# Patient Record
Sex: Male | Born: 1945
Health system: Southern US, Community
[De-identification: ages and names within clinical notes are randomized; demographics above are authoritative.]

## PROBLEM LIST (undated history)

## (undated) DIAGNOSIS — I4729 Other ventricular tachycardia: Secondary | ICD-10-CM

## (undated) DIAGNOSIS — M48061 Spinal stenosis, lumbar region without neurogenic claudication: Secondary | ICD-10-CM

## (undated) DIAGNOSIS — C679 Malignant neoplasm of bladder, unspecified: Secondary | ICD-10-CM

## (undated) DIAGNOSIS — I739 Peripheral vascular disease, unspecified: Secondary | ICD-10-CM

## (undated) DIAGNOSIS — E119 Type 2 diabetes mellitus without complications: Secondary | ICD-10-CM

## (undated) DIAGNOSIS — I493 Ventricular premature depolarization: Secondary | ICD-10-CM

## (undated) DIAGNOSIS — I25110 Atherosclerotic heart disease of native coronary artery with unstable angina pectoris: Secondary | ICD-10-CM

## (undated) DIAGNOSIS — I2510 Atherosclerotic heart disease of native coronary artery without angina pectoris: Secondary | ICD-10-CM

## (undated) DIAGNOSIS — I219 Acute myocardial infarction, unspecified: Secondary | ICD-10-CM

## (undated) DIAGNOSIS — Z955 Presence of coronary angioplasty implant and graft: Secondary | ICD-10-CM

## (undated) DIAGNOSIS — E785 Hyperlipidemia, unspecified: Secondary | ICD-10-CM

## (undated) DIAGNOSIS — I480 Paroxysmal atrial fibrillation: Secondary | ICD-10-CM

## (undated) DIAGNOSIS — M1990 Unspecified osteoarthritis, unspecified site: Secondary | ICD-10-CM

## (undated) DIAGNOSIS — I5022 Chronic systolic (congestive) heart failure: Secondary | ICD-10-CM

## (undated) DIAGNOSIS — I255 Ischemic cardiomyopathy: Secondary | ICD-10-CM

## (undated) DIAGNOSIS — F17210 Nicotine dependence, cigarettes, uncomplicated: Secondary | ICD-10-CM

## (undated) DIAGNOSIS — I472 Ventricular tachycardia: Secondary | ICD-10-CM

## (undated) DIAGNOSIS — I10 Essential (primary) hypertension: Secondary | ICD-10-CM

## (undated) HISTORY — DX: Ventricular premature depolarization: I49.3

## (undated) HISTORY — DX: Paroxysmal atrial fibrillation: I48.0

## (undated) HISTORY — DX: Presence of coronary angioplasty implant and graft: Z95.5

## (undated) HISTORY — DX: Atherosclerotic heart disease of native coronary artery without angina pectoris: I25.10

## (undated) HISTORY — DX: Malignant neoplasm of bladder, unspecified: C67.9

## (undated) HISTORY — DX: Ventricular tachycardia: I47.2

## (undated) HISTORY — PX: APPENDECTOMY: SHX54

## (undated) HISTORY — DX: Spinal stenosis, lumbar region without neurogenic claudication: M48.061

## (undated) HISTORY — DX: Hyperlipidemia, unspecified: E78.5

## (undated) HISTORY — DX: Ischemic cardiomyopathy: I25.5

## (undated) HISTORY — DX: Acute myocardial infarction, unspecified: I21.9

## (undated) HISTORY — DX: Essential (primary) hypertension: I10

## (undated) HISTORY — DX: Peripheral vascular disease, unspecified: I73.9

## (undated) HISTORY — PX: BACK SURGERY: SHX140

## (undated) HISTORY — DX: Chronic systolic (congestive) heart failure: I50.22

## (undated) HISTORY — DX: Other ventricular tachycardia: I47.29

## (undated) HISTORY — DX: Nicotine dependence, cigarettes, uncomplicated: F17.210

## (undated) HISTORY — PX: SHOULDER OPEN ROTATOR CUFF REPAIR: SHX2407

## (undated) HISTORY — PX: CORONARY ANGIOPLASTY WITH STENT PLACEMENT: SHX49

## (undated) HISTORY — DX: Atherosclerotic heart disease of native coronary artery with unstable angina pectoris: I25.110

## (undated) SURGICAL SUPPLY — 1 items: RANGER DCB 6.0X80 135 (BALLOONS) ×1 IMPLANT

---

## 1990-04-18 HISTORY — PX: AORTA - BILATERAL FEMORAL ARTERY BYPASS GRAFT: SHX1175

## 1998-07-09 ENCOUNTER — Inpatient Hospital Stay (HOSPITAL_COMMUNITY): Admission: AD | Admit: 1998-07-09 | Discharge: 1998-07-12 | Payer: Self-pay | Admitting: Internal Medicine

## 2004-03-03 ENCOUNTER — Inpatient Hospital Stay (HOSPITAL_COMMUNITY): Admission: EM | Admit: 2004-03-03 | Discharge: 2004-03-05 | Payer: Self-pay | Admitting: Emergency Medicine

## 2004-03-03 ENCOUNTER — Ambulatory Visit: Payer: Self-pay | Admitting: Cardiology

## 2004-03-04 ENCOUNTER — Encounter: Payer: Self-pay | Admitting: Cardiology

## 2004-03-19 ENCOUNTER — Ambulatory Visit: Payer: Self-pay | Admitting: Cardiology

## 2004-04-01 ENCOUNTER — Ambulatory Visit: Payer: Self-pay

## 2004-04-01 ENCOUNTER — Ambulatory Visit: Payer: Self-pay | Admitting: Cardiology

## 2004-12-07 ENCOUNTER — Ambulatory Visit: Payer: Self-pay | Admitting: *Deleted

## 2004-12-07 ENCOUNTER — Inpatient Hospital Stay (HOSPITAL_COMMUNITY): Admission: AD | Admit: 2004-12-07 | Discharge: 2004-12-10 | Payer: Self-pay | Admitting: Cardiology

## 2004-12-28 ENCOUNTER — Ambulatory Visit: Payer: Self-pay | Admitting: *Deleted

## 2005-01-06 ENCOUNTER — Inpatient Hospital Stay (HOSPITAL_COMMUNITY): Admission: EM | Admit: 2005-01-06 | Discharge: 2005-01-09 | Payer: Self-pay | Admitting: Emergency Medicine

## 2005-01-18 ENCOUNTER — Ambulatory Visit: Payer: Self-pay | Admitting: Internal Medicine

## 2005-03-09 ENCOUNTER — Ambulatory Visit: Payer: Self-pay

## 2005-03-09 ENCOUNTER — Encounter: Payer: Self-pay | Admitting: Cardiology

## 2005-03-09 ENCOUNTER — Ambulatory Visit: Payer: Self-pay | Admitting: Cardiology

## 2005-05-11 ENCOUNTER — Ambulatory Visit: Payer: Self-pay | Admitting: Cardiology

## 2006-02-10 ENCOUNTER — Ambulatory Visit: Payer: Self-pay | Admitting: Cardiology

## 2006-11-07 ENCOUNTER — Ambulatory Visit: Payer: Self-pay | Admitting: Cardiology

## 2006-11-07 LAB — CONVERTED CEMR LAB
ALT: 35 units/L (ref 0–53)
AST: 29 units/L (ref 0–37)
Albumin: 3.7 g/dL (ref 3.5–5.2)
Alkaline Phosphatase: 52 units/L (ref 39–117)
BUN: 14 mg/dL (ref 6–23)
Basophils Absolute: 0 10*3/uL (ref 0.0–0.1)
Basophils Relative: 0.7 % (ref 0.0–1.0)
Bilirubin, Direct: 0.1 mg/dL (ref 0.0–0.3)
CO2: 25 meq/L (ref 19–32)
Calcium: 8.8 mg/dL (ref 8.4–10.5)
Chloride: 104 meq/L (ref 96–112)
Cholesterol: 149 mg/dL (ref 0–200)
Creatinine, Ser: 1 mg/dL (ref 0.4–1.5)
Eosinophils Absolute: 0.1 10*3/uL (ref 0.0–0.6)
Eosinophils Relative: 1.2 % (ref 0.0–5.0)
GFR calc Af Amer: 98 mL/min
GFR calc non Af Amer: 81 mL/min
Glucose, Bld: 188 mg/dL — ABNORMAL HIGH (ref 70–99)
HCT: 43.6 % (ref 39.0–52.0)
HDL: 24.4 mg/dL — ABNORMAL LOW (ref >39.0)
Hemoglobin: 15.2 g/dL (ref 13.0–17.0)
LDL Cholesterol: 104 mg/dL — ABNORMAL HIGH (ref 0–99)
Lymphocytes Relative: 28.9 % (ref 12.0–46.0)
MCHC: 34.9 g/dL (ref 30.0–36.0)
MCV: 91.1 fL (ref 78.0–100.0)
Monocytes Absolute: 0.4 10*3/uL (ref 0.2–0.7)
Monocytes Relative: 5.9 % (ref 3.0–11.0)
Neutro Abs: 4.4 10*3/uL (ref 1.4–7.7)
Neutrophils Relative %: 63.3 % (ref 43.0–77.0)
Platelets: 226 10*3/uL (ref 150–400)
Potassium: 4 meq/L (ref 3.5–5.1)
RBC: 4.79 M/uL (ref 4.22–5.81)
RDW: 12.3 % (ref 11.5–14.6)
Sodium: 136 meq/L (ref 135–145)
TSH: 1.44 microintl units/mL (ref 0.35–5.50)
Total Bilirubin: 0.5 mg/dL (ref 0.3–1.2)
Total CHOL/HDL Ratio: 6.1
Total Protein: 6.4 g/dL (ref 6.0–8.3)
Triglycerides: 101 mg/dL (ref 0–149)
VLDL: 20 mg/dL (ref 0–40)
WBC: 6.9 10*3/uL (ref 4.5–10.5)

## 2006-11-20 ENCOUNTER — Ambulatory Visit: Payer: Self-pay

## 2007-10-26 ENCOUNTER — Ambulatory Visit: Payer: Self-pay | Admitting: Cardiology

## 2008-01-04 ENCOUNTER — Ambulatory Visit: Payer: Self-pay

## 2008-01-04 ENCOUNTER — Encounter: Payer: Self-pay | Admitting: Cardiology

## 2008-09-08 ENCOUNTER — Telehealth (INDEPENDENT_AMBULATORY_CARE_PROVIDER_SITE_OTHER): Payer: Self-pay | Admitting: *Deleted

## 2008-09-22 ENCOUNTER — Encounter: Payer: Self-pay | Admitting: Cardiology

## 2008-09-22 DIAGNOSIS — I2589 Other forms of chronic ischemic heart disease: Secondary | ICD-10-CM | POA: Insufficient documentation

## 2008-09-22 DIAGNOSIS — E785 Hyperlipidemia, unspecified: Secondary | ICD-10-CM | POA: Insufficient documentation

## 2008-09-22 DIAGNOSIS — I10 Essential (primary) hypertension: Secondary | ICD-10-CM | POA: Insufficient documentation

## 2008-09-22 DIAGNOSIS — I2510 Atherosclerotic heart disease of native coronary artery without angina pectoris: Secondary | ICD-10-CM | POA: Insufficient documentation

## 2008-09-22 DIAGNOSIS — I5022 Chronic systolic (congestive) heart failure: Secondary | ICD-10-CM | POA: Insufficient documentation

## 2008-09-22 DIAGNOSIS — I739 Peripheral vascular disease, unspecified: Secondary | ICD-10-CM | POA: Insufficient documentation

## 2008-09-22 HISTORY — DX: Peripheral vascular disease, unspecified: I73.9

## 2008-09-22 HISTORY — DX: Atherosclerotic heart disease of native coronary artery without angina pectoris: I25.10

## 2008-09-22 HISTORY — DX: Essential (primary) hypertension: I10

## 2008-09-22 HISTORY — DX: Other forms of chronic ischemic heart disease: I25.89

## 2008-09-22 HISTORY — DX: Hyperlipidemia, unspecified: E78.5

## 2008-09-22 HISTORY — DX: Chronic systolic (congestive) heart failure: I50.22

## 2008-09-23 ENCOUNTER — Ambulatory Visit: Payer: Self-pay | Admitting: Cardiology

## 2008-10-07 ENCOUNTER — Telehealth (INDEPENDENT_AMBULATORY_CARE_PROVIDER_SITE_OTHER): Payer: Self-pay | Admitting: *Deleted

## 2008-10-08 ENCOUNTER — Encounter: Payer: Self-pay | Admitting: Cardiology

## 2008-10-08 ENCOUNTER — Ambulatory Visit: Payer: Self-pay

## 2008-10-13 ENCOUNTER — Telehealth (INDEPENDENT_AMBULATORY_CARE_PROVIDER_SITE_OTHER): Payer: Self-pay | Admitting: *Deleted

## 2008-10-22 ENCOUNTER — Ambulatory Visit: Payer: Self-pay

## 2008-10-22 ENCOUNTER — Telehealth (INDEPENDENT_AMBULATORY_CARE_PROVIDER_SITE_OTHER): Payer: Self-pay | Admitting: *Deleted

## 2008-10-22 ENCOUNTER — Telehealth: Payer: Self-pay | Admitting: Cardiology

## 2008-10-22 ENCOUNTER — Encounter: Payer: Self-pay | Admitting: Cardiology

## 2008-11-03 ENCOUNTER — Telehealth: Payer: Self-pay | Admitting: Cardiology

## 2008-12-16 ENCOUNTER — Ambulatory Visit: Payer: Self-pay | Admitting: Cardiology

## 2008-12-31 ENCOUNTER — Ambulatory Visit (HOSPITAL_COMMUNITY): Admission: RE | Admit: 2008-12-31 | Discharge: 2009-01-01 | Payer: Self-pay | Admitting: Neurological Surgery

## 2008-12-31 HISTORY — PX: LUMBAR MICRODISCECTOMY: SHX99

## 2009-01-07 ENCOUNTER — Ambulatory Visit: Payer: Self-pay | Admitting: Internal Medicine

## 2009-05-12 ENCOUNTER — Encounter: Payer: Self-pay | Admitting: Cardiology

## 2009-05-18 ENCOUNTER — Telehealth: Payer: Self-pay | Admitting: Cardiology

## 2009-05-25 ENCOUNTER — Ambulatory Visit: Payer: Self-pay | Admitting: Cardiology

## 2009-05-27 ENCOUNTER — Encounter: Payer: Self-pay | Admitting: Cardiology

## 2009-05-27 ENCOUNTER — Ambulatory Visit: Payer: Self-pay

## 2009-05-29 ENCOUNTER — Ambulatory Visit: Payer: Self-pay | Admitting: Cardiovascular Disease

## 2009-05-29 DIAGNOSIS — F17200 Nicotine dependence, unspecified, uncomplicated: Secondary | ICD-10-CM

## 2009-05-29 HISTORY — DX: Nicotine dependence, unspecified, uncomplicated: F17.200

## 2009-06-08 ENCOUNTER — Ambulatory Visit: Payer: Self-pay | Admitting: Cardiovascular Disease

## 2009-06-08 LAB — CONVERTED CEMR LAB
BUN: 17 mg/dL (ref 6–23)
Basophils Absolute: 0 10*3/uL (ref 0.0–0.1)
Basophils Relative: 0.3 % (ref 0.0–3.0)
CO2: 30 meq/L (ref 19–32)
Calcium: 9.4 mg/dL (ref 8.4–10.5)
Chloride: 112 meq/L (ref 96–112)
Creatinine, Ser: 1.1 mg/dL (ref 0.4–1.5)
Eosinophils Absolute: 0.1 10*3/uL (ref 0.0–0.7)
Eosinophils Relative: 0.8 % (ref 0.0–5.0)
GFR calc non Af Amer: 71.54 mL/min (ref >60)
Glucose, Bld: 148 mg/dL — ABNORMAL HIGH (ref 70–99)
HCT: 42.9 % (ref 39.0–52.0)
Hemoglobin: 14.2 g/dL (ref 13.0–17.0)
INR: 1 (ref 0.8–1.0)
Lymphocytes Relative: 37.3 % (ref 12.0–46.0)
Lymphs Abs: 2.4 10*3/uL (ref 0.7–4.0)
MCHC: 33 g/dL (ref 30.0–36.0)
MCV: 96.4 fL (ref 78.0–100.0)
Monocytes Absolute: 0.5 10*3/uL (ref 0.1–1.0)
Monocytes Relative: 7.6 % (ref 3.0–12.0)
Neutro Abs: 3.5 10*3/uL (ref 1.4–7.7)
Neutrophils Relative %: 54 % (ref 43.0–77.0)
Platelets: 208 10*3/uL (ref 150.0–400.0)
Potassium: 4.7 meq/L (ref 3.5–5.1)
Prothrombin Time: 9.9 s (ref 9.1–11.7)
RBC: 4.45 M/uL (ref 4.22–5.81)
RDW: 13 % (ref 11.5–14.6)
Sodium: 144 meq/L (ref 135–145)
WBC: 6.5 10*3/uL (ref 4.5–10.5)

## 2009-06-11 ENCOUNTER — Ambulatory Visit: Payer: Self-pay | Admitting: Cardiovascular Disease

## 2009-06-11 ENCOUNTER — Ambulatory Visit (HOSPITAL_COMMUNITY): Admission: RE | Admit: 2009-06-11 | Discharge: 2009-06-11 | Payer: Self-pay | Admitting: Cardiovascular Disease

## 2009-06-15 ENCOUNTER — Ambulatory Visit: Payer: Self-pay | Admitting: Surgery

## 2009-06-19 ENCOUNTER — Ambulatory Visit: Payer: Self-pay | Admitting: Vascular Surgery

## 2009-06-24 ENCOUNTER — Ambulatory Visit (HOSPITAL_COMMUNITY): Admission: RE | Admit: 2009-06-24 | Discharge: 2009-06-24 | Payer: Self-pay | Admitting: Vascular Surgery

## 2009-07-14 ENCOUNTER — Encounter: Admission: RE | Admit: 2009-07-14 | Discharge: 2009-07-14 | Payer: Self-pay | Admitting: Neurological Surgery

## 2009-07-16 ENCOUNTER — Encounter (INDEPENDENT_AMBULATORY_CARE_PROVIDER_SITE_OTHER): Payer: Self-pay | Admitting: *Deleted

## 2009-07-17 HISTORY — PX: FEMORAL-FEMORAL BYPASS GRAFT: SHX936

## 2009-07-20 ENCOUNTER — Ambulatory Visit: Payer: Self-pay | Admitting: Vascular Surgery

## 2009-07-20 ENCOUNTER — Inpatient Hospital Stay (HOSPITAL_COMMUNITY): Admission: RE | Admit: 2009-07-20 | Discharge: 2009-07-22 | Payer: Self-pay | Admitting: Vascular Surgery

## 2009-07-21 ENCOUNTER — Encounter: Payer: Self-pay | Admitting: Vascular Surgery

## 2009-08-14 ENCOUNTER — Ambulatory Visit: Payer: Self-pay | Admitting: Vascular Surgery

## 2009-10-15 ENCOUNTER — Telehealth: Payer: Self-pay | Admitting: Cardiovascular Disease

## 2010-04-28 ENCOUNTER — Ambulatory Visit: Admit: 2010-04-28 | Payer: Self-pay | Admitting: Vascular Surgery

## 2010-05-05 ENCOUNTER — Encounter: Payer: Self-pay | Admitting: Cardiovascular Disease

## 2010-05-05 ENCOUNTER — Ambulatory Visit
Admission: RE | Admit: 2010-05-05 | Discharge: 2010-05-05 | Payer: Self-pay | Source: Home / Self Care | Attending: Cardiovascular Disease | Admitting: Cardiovascular Disease

## 2010-05-18 ENCOUNTER — Ambulatory Visit
Admission: RE | Admit: 2010-05-18 | Discharge: 2010-05-18 | Payer: Self-pay | Source: Home / Self Care | Attending: Vascular Surgery | Admitting: Vascular Surgery

## 2010-05-18 ENCOUNTER — Encounter: Payer: Self-pay | Admitting: Cardiovascular Disease

## 2010-05-19 NOTE — Assessment & Plan Note (Addendum)
OFFICE VISIT  [name], [name] DOB:  Nov 07, 1945                                       05/18/2010 ZOXWR#:[id number]  HISTORY OF PRESENT ILLNESS:  The patient presents today for followup of his arterial insufficiency.  He is well-known to me from prior aortobifemoral bypass grafting for occlusive disease nearly 20 years ago.  He had subsequently had thrombosis of his right limb of his graft and in April 2011 underwent left to right fem-fem bypass.  He has done quite well since that time.  He has no claudication symptoms.  He does have multiple other difficulties to include degenerative disk disease and he has seen Dr. Marikay Alar for potential lumbar surgery.  He has, also, peripheral neuropathy related to diabetes.  He does have a history of coronary disease with prior stenting in his coronaries.  He reports that he is stable from a cardiac standpoint.  PAST MEDICAL HISTORY:  Significant for hypertension, elevated blood pressure, elevated cholesterol, prior heart attack.  He does have a history of bladder cancer.  SOCIAL HISTORY:  He is married with 2 children.  He is retired.  He does continue to smoke 1 pack of cigarettes per day.  He does not drink alcohol on a regular  basis.  FAMILY HISTORY:  Significant for myocardial infarction in his father and sister.  REVIEW OF SYSTEMS:  He does have shortness of breath with exertion which is his most limiting symptom.  Otherwise, nothing positive on the review except as in the history of present illness.  PHYSICAL EXAM:  General:  Well-developed, well-nourished, white male appearing stated age in no acute stress.  Vital signs:  Blood pressure 164/68, pulse 90, respirations 20.  He is in no acute distress.  HEENT: Normal.  Chest:  Clear bilaterally.  Heart:  Regular rate rhythm. Abdomen:  Soft, nontender.  He has a well-healed midline incision without evidence of hernia.  He does have 2+ femoral, 2+ popliteal,  and 2+ posterior tibial pulses bilaterally.  Neurologic:  No focal weakness or paresthesias.  SKIN:  Without ulcers or rashes.  He does report some tingling and numbness sensation in the medial aspect of his left thigh.  ASSESSMENT AND PLAN:  I do not feel this is evidence of ischemia, but feel that this may be related to the fact that he has had 2 prior incisions in this area.  I explained that this typically is expected to resolve pretty quickly after surgery and if he continues to have some discomfort this may be persistent.  He understands this and is not limited by this.  He will continue his walking program and will notify should he develop any claudication symptoms.  Otherwise, he will follow up with Dr. Clifton James from a cardiac standpoint.    Larina Earthly, M.D. Electronically Signed  TFE/MEDQ  D:  05/18/2010  T:  05/19/2010  Job:  [id number]  cc:   Verne Carrow, MD Sherri Sear, MD

## 2010-05-20 NOTE — Assessment & Plan Note (Signed)
Summary: rov per spouse call/lg  Medications Added VITAMIN D 1000 UNIT TABS (CHOLECALCIFEROL) take 1 tablet once daily      Allergies Added: NKDA  Primary Provider:  Dr. Maisie Fus [facility])  CC:  no cardiac complaint.  History of Present Illness: 65 yo WM with history of CAD, ischemic cardiomyopathy, HTN, hyperlipidemia, ongoing tobacco abuse and known PVD with prior aortobifemoral bypass by Dr. Arbie Cookey in 1992 who is here today for PV follow up.  I saw him first in February 2011. He was having right hip pain with radiation down to his right knee. No calf pain. The pain improved with rest. He did hurt his back in 2009 and the right leg pain started at that time. He has since had a back surgery in 9/10. This did not help his right leg pain. He had a nerve conduction study in the right leg and was told that it was abnormal.  He had non-invasive dopplers done in our office in February 2011 with ABI  0.45 on the right and 0.56 on the left. The right limb of the aortofemoral bypass graft appeared  to be occluded. The left limb of the bypass was patent. There was dampened flow in the right CFA. Monophasic flow in the SFA and popliteal artery. Left SFA and popliteal with brisk flow. Right ATA and PTA waveforms are monophasic. Left ATA and PTA waveforms are biphasic. Probable tibial disease on left. I arranged a distal aortogram and was found to have severe disease as documented below. He was seen by Dr. Arbie Cookey and underwent fem to fem bypass on 07/21/09. He has continued to have some left leg pain but his right leg is much better. He has chronic back issues and had a recent MRI. He had a recent doppler of his lower extremities at [facility] but I do not have this report. This was ordered by Dr. Maisie Fus in [place]. He missed f/u with Dr. Arbie Cookey as planned last month. He continues to smoke. His cardiac issues were followed in the past by Dr. Juanda Chance . He has no chest pain or SOB, near syncope or syncope.     Current Medications (verified): 1)  Metoprolol Tartrate 50 Mg Tabs (Metoprolol Tartrate) .[name].. 1 By Mouth Two Times A Day 2)  Cozaar 50 Mg Tabs (Losartan Potassium) .[name].. 1 By Mouth Once Daily 3)  Zetia 10 Mg Tabs (Ezetimibe) .... Take One Tablet By Mouth Daily. 4)  Pravachol 20 Mg Tabs (Pravastatin Sodium) .[name].. 1 By Mouth Once Daily 5)  Aspirin 81 Mg Tbec (Aspirin) .... Take One Tablet By Mouth Daily 6)  Janumet 50-1000 Mg Tabs (Sitagliptin-Metformin Hcl) .[name].. 1 By Mouth Once Two Times A Day 7)  Actos 30 Mg Tabs (Pioglitazone Hcl) .[name].. 1 By Mouth Daily 8)  Spiriva Handihaler 18 Mcg Caps (Tiotropium Bromide Monohydrate) .... As Directed 9)  Nasonex 50 Mcg/act Susp (Mometasone Furoate) .... As Directed 10)  Proventil Hfa 108 (90 Base) Mcg/act Aers (Albuterol Sulfate) .... As Directed 11)  Vitamin D 1000 Unit Tabs (Cholecalciferol) .... Take 1 Tablet Once Daily  Allergies (verified): No Known Drug Allergies  Past History:  Past Medical History: 1. Coronary artery disease, status post DMI Rx Taxus stent RCA 2006 with subsequent Stent LAD and      subsequent stent thrombosis RCA unable to be opened 2006-- negative mv 10/2008 2. Ischemic cardiomyopathy, ejection fraction of 30%-35% by echo 11/2008. 3. Hyperlipidemia. 4. Peripheral vascular disease, status post bilateral aortobifemoral bypass in 1992 with recent fem  to fem bypass April 2011 per Dr. Arbie Cookey 5. Hypertension. 6. Congestive heart failure, class II/III (systolic). 7. Continued cigarette use.  8.  LSS disease 9. Diabetes  Social History: Reviewed history from 05/29/2009 and no changes required. Single.  One daughter.  Continues to smoke 3/4ppd (had smoked 2-3 ppd). x 50 years. Occasional EtOH. The patient lives with his girlfriend in [place], [place].  He is a retired [employer] architect.  Review of Systems  The patient denies fatigue, malaise, fever, weight gain/loss, vision loss, decreased hearing, hoarseness, chest pain,  palpitations, shortness of breath, prolonged cough, wheezing, sleep apnea, coughing up blood, abdominal pain, blood in stool, nausea, vomiting, diarrhea, heartburn, incontinence, blood in urine, muscle weakness, joint pain, leg swelling, rash, skin lesions, headache, fainting, dizziness, depression, anxiety, enlarged lymph nodes, easy bruising or bleeding, and environmental allergies.         Leg pain. See HPI.   Vital Signs:  Patient profile:   65 year old male Height:      67 inches Weight:      196.50 pounds BMI:     30.89 Pulse rate:   77 / minute BP sitting:   136 / 82  Vitals Entered By: Haze Boyden, CMA (May 05, 2010 12:53 PM)  Physical Exam  General:  General: Well developed, well nourished, NAD HEENT: OP clear, mucus membranes moist SKIN: warm, dry Neuro: No focal deficits Musculoskeletal: Muscle strength 5/5 all ext Psychiatric: Mood and affect normal Neck: No JVD, no carotid bruits, no thyromegaly, no lymphadenopathy. Lungs:Clear bilaterally, no wheezes, rhonci, crackles CV: RRR no murmurs, gallops rubs Abdomen: soft, NT, ND, BS present Extremities: No edema, pulses 1+ bilateral DP/PT    EKG  Procedure date:  05/05/2010  Findings:      Sinus rhythm, rate 77 bpm. Occasional PACs. Non-specific T wave flattening.   Impression & Recommendations:  Problem # 1:  CAD, NATIVE VESSEL (ICD-414.01) Stable. Continue current meds.   His updated medication list for this problem includes:    Metoprolol Tartrate 50 Mg Tabs (Metoprolol tartrate) .[name]... 1 by mouth two times a day    Aspirin 81 Mg Tbec (Aspirin) .[name]... Take one tablet by mouth daily  Problem # 2:  CARDIOMYOPATHY, ISCHEMIC (ICD-414.8) Stable. Volume status ok.  Continue current meds.   His updated medication list for this problem includes:    Metoprolol Tartrate 50 Mg Tabs (Metoprolol tartrate) .[name]... 1 by mouth two times a day    Cozaar 50 Mg Tabs (Losartan potassium) .[name]... 1 by mouth once daily     Aspirin 81 Mg Tbec (Aspirin) .[name]... Take one tablet by mouth daily  Problem # 3:  PVD (ICD-443.9) Recent fem to fem bypass by Dr. Arbie Cookey. He will follow up with Dr. Arbie Cookey in the next several weeks. We will get the LE doppler reports from Dr.Thomas's office in [place] and fax over to Dr. Arbie Cookey. He needs to have close follow up with Dr. Arbie Cookey with non-invasive imaging on a schedule.   Smoking cessation advised.   Patient Instructions: 1)  Your physician recommends that you schedule a follow-up appointment in: 1 year 2)  Your physician recommends that you continue on your current medications as directed. Please refer to the Current Medication list given to you today. Prescriptions: PRAVACHOL 20 MG TABS (PRAVASTATIN SODIUM) 1 by mouth once daily  #90 x 5   Entered by:   Whitney Maeola Sarah RN   Authorized by:   Verne Carrow, MD   Signed by:  Whitney Maeola Sarah RN on 05/05/2010   Method used:   Print then Give to Patient   RxID:   [id number] ZETIA 10 MG TABS (EZETIMIBE) Take one tablet by mouth daily.  #90 x 3   Entered by:   Whitney Maeola Sarah RN   Authorized by:   Verne Carrow, MD   Signed by:   Whitney Maeola Sarah RN on 05/05/2010   Method used:   Print then Give to Patient   RxID:   [id number] COZAAR 50 MG TABS (LOSARTAN POTASSIUM) 1 by mouth once daily  #90 x 3   Entered by:   Whitney Maeola Sarah RN   Authorized by:   Verne Carrow, MD   Signed by:   Ellender Hose RN on 05/05/2010   Method used:   Print then Give to Patient   RxID:   [id number] METOPROLOL TARTRATE 50 MG TABS (METOPROLOL TARTRATE) 1 by mouth two times a day  #180 x 3   Entered by:   Whitney Maeola Sarah RN   Authorized by:   Verne Carrow, MD   Signed by:   Ellender Hose RN on 05/05/2010   Method used:   Print then Give to Patient   RxID:   [id number]

## 2010-05-20 NOTE — Letter (Signed)
Summary: Appointment - Missed  [facility], Main Office  [address]   [place], [place] [address]   Phone: [phone]  Fax: [phone]     July 16, 2009 MRN: [id number]   [name] [address] [place], [place]  [address]   Dear [name],  Our records indicate you missed your appointment on  06-29-09  with Dr.  Clifton James       It is very important that we reach you to reschedule this appointment. We look forward to participating in your health care needs. Please contact us at the number listed above at your earliest convenience to reschedule this appointment.     Sincerely,    Glass blower/designer

## 2010-05-20 NOTE — Assessment & Plan Note (Signed)
Summary: 4 month rov  Medications Added ACTOS 30 MG TABS (PIOGLITAZONE HCL) 1 by mouth daily CIPROFLOXACIN HCL 750 MG TABS (CIPROFLOXACIN HCL) 1 by mouth two times a day SPIRIVA HANDIHALER 18 MCG CAPS (TIOTROPIUM BROMIDE MONOHYDRATE) as directed NASONEX 50 MCG/ACT SUSP (MOMETASONE FUROATE) as directed PROVENTIL HFA 108 (90 BASE) MCG/ACT AERS (ALBUTEROL SULFATE) as directed      Allergies Added: NKDA  Primary Provider:  dr Maisie Fus [facility])   History of Present Illness: The patient is 65 years old and return for management of CAD. He had stenting of the LAD and right coronary in the past and later had stent thrombosis of the right coronary stent was unsuccessful PCI. His ejection fraction is in the range of 30-35%.  I saw him last for a preoperative evaluation prior to back surgery and he got to his back surgery okay.  I also referred him to Dr. all read for consideration for an ICD. He is still not sure he wants to do this. 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  He also has peripheral vascular disease and has had aortofemoral bypass graft surgery. he is now having pain in his right hip when he walks and asked if this could be related to his circulation. He has known nerve damage in his right leg from recent nerve conduction studies related to his lumbar spine disease.  His other problems include hypertension and hyperlipidemia.  Current Medications (verified): 1)  Metoprolol Tartrate 50 Mg Tabs (Metoprolol Tartrate) .[name].. 1 By Mouth Two Times A Day 2)  Cozaar 50 Mg Tabs (Losartan Potassium) .[name].. 1 By Mouth Once Daily 3)  Zetia 10 Mg Tabs (Ezetimibe) .... Take One Tablet By Mouth Daily. 4)  Pravachol 20 Mg Tabs (Pravastatin Sodium) .[name].. 1 By Mouth Once Daily 5)  Aspirin 81 Mg Tbec (Aspirin) .... Take One Tablet By Mouth Daily 6)  Fish Oil Maximum Strength 1200 Mg Caps (Omega-3 Fatty Acids) .... 1000mg  Once Daily 7)  Janumet 50-1000 Mg Tabs  (Sitagliptin-Metformin Hcl) .[name].. 1 By Mouth Once Two Times A Day 8)  Actos 30 Mg Tabs (Pioglitazone Hcl) .[name].. 1 By Mouth Daily 9)  Ciprofloxacin Hcl 750 Mg Tabs (Ciprofloxacin Hcl) .[name].. 1 By Mouth Two Times A Day 10)  Spiriva Handihaler 18 Mcg Caps (Tiotropium Bromide Monohydrate) .... As Directed 11)  Nasonex 50 Mcg/act Susp (Mometasone Furoate) .... As Directed 12)  Proventil Hfa 108 (90 Base) Mcg/act Aers (Albuterol Sulfate) .... As Directed  Allergies (verified): No Known Drug Allergies  Past History:  Past Medical History: Reviewed history from 01/07/2009 and no changes required. 1. Coronary artery disease, status post DMI Rx Taxus stent RCA 2006 with subsequent Stent LAD and      subsequent stent thrombosis RCA unable to be opened 2006-- negative mv 10/2008 2. Ischemic cardiomyopathy, ejection fraction of 30%-35% by echo 11/2008. 3. Hyperlipidemia. 4. Peripheral vascular disease, status post bilateral femoral- popliteal bypass graft surgery 1992. 5. Hypertension. 6. Congestive heart failure, class II/III (systolic). 7. Continued cigarette use.  8.  LSS disease 9. Diabetes 10.HL  Review of Systems       ROS is negative except as outlined in HPI.   Vital Signs:  Patient profile:   65 year old male Height:      67 inches Weight:      189 pounds BMI:     29.71 Pulse rate:   85 / minute Resp:     16 per minute BP  sitting:   130 / 76  (left arm)  Vitals Entered By: Marrion Coy, CNA (May 25, 2009 10:08 AM)  Physical Exam  Additional Exam:  Gen. Well-nourished, in no distress   Neck: No JVD, thyroid not enlarged, no carotid bruits Lungs: No tachypnea, clear without rales, rhonchi or wheezes Cardiovascular: Rhythm regular, PMI not displaced,  heart sounds  normal, no murmurs or gallops, no peripheral edema, the right femoral pulse and pedal pulses on the right foot were difficult to feel Abdomen: BS normal, abdomen soft and non-tender without masses or organomegaly,  no hepatosplenomegaly. MS: No deformities, no cyanosis or clubbing   Neuro:  No focal sns   Skin:  no lesions    Impression & Recommendations:  Problem # 1:  CAD, NATIVE VESSEL (ICD-414.01)  He has had previous stenting of the LAD and RCA and has had stent thrombosis of the RCA was unsuccessful PCI. He's had no recent chest pain in the palm appears stable. His updated medication list for this problem includes:    Metoprolol Tartrate 50 Mg Tabs (Metoprolol tartrate) .[name]... 1 by mouth two times a day    Aspirin 81 Mg Tbec (Aspirin) .[name]... Take one tablet by mouth daily  His updated medication list for this problem includes:    Metoprolol Tartrate 50 Mg Tabs (Metoprolol tartrate) .[name]... 1 by mouth two times a day    Aspirin 81 Mg Tbec (Aspirin) .[name]... Take one tablet by mouth daily  Orders: EKG w/ Interpretation (93000)  Problem # 2:  SYSTOLIC HEART FAILURE, CHRONIC (ICD-428.22) He has an ejection fraction of 30% and chronic systolic heart failure. He appears euvolemic today and is probably class II. He is still undecided about whether to have an ICD. His updated medication list for this problem includes:    Metoprolol Tartrate 50 Mg Tabs (Metoprolol tartrate) .[name]... 1 by mouth two times a day    Cozaar 50 Mg Tabs (Losartan potassium) .[name]... 1 by mouth once daily    Aspirin 81 Mg Tbec (Aspirin) .[name]... Take one tablet by mouth daily  Problem # 3:  PVD (ICD-443.9) He has had prior aorto bifemoral bypass graft surgery. He has what sounds like claudication of his right hip and he has decreased pulses in the right femoral area. I suspect his symptoms are claudication. He has an appointment to see Dr. early in March and would like to see him sooner if possible. We will try to arrange that.  Other Orders: Arterial Duplex Lower Extremity (Arterial Duplex Low) Misc. Referral (Misc. Ref)  Patient Instructions: 1)  Your physician wants you to follow-up in: 6 months.  You will receive a reminder  letter in the mail two months in advance. If you don't receive a letter, please call our office to schedule the follow-up appointment. 2)  You are scheduled for a vascular ultrasound on wed 05/27/09 @ 11:00am  3)  You are scheduled to see Dr. Earney Hamburg on Friday 05/29/09 @ 11:00am.  Prescriptions: PRAVACHOL 20 MG TABS (PRAVASTATIN SODIUM) 1 by mouth once daily  #90 x 3   Entered by:   Sherri Rad, RN, BSN   Authorized by:   Lenoria Farrier, MD, [facility]   Signed by:   Sherri Rad, RN, BSN on 05/25/2009   Method used:   Print then Give to Patient   RxID:   [id number] ZETIA 10 MG TABS (EZETIMIBE) Take one tablet by mouth daily.  #90 x 3   Entered by:   Sherri Rad,  RN, BSN   Authorized by:   Lenoria Farrier, MD, [facility]   Signed by:   Sherri Rad, RN, BSN on 05/25/2009   Method used:   Print then Give to Patient   RxID:   [id number] COZAAR 50 MG TABS (LOSARTAN POTASSIUM) 1 by mouth once daily  #90 x 3   Entered by:   Sherri Rad, RN, BSN   Authorized by:   Lenoria Farrier, MD, [facility]   Signed by:   Sherri Rad, RN, BSN on 05/25/2009   Method used:   Print then Give to Patient   RxID:   [id number] METOPROLOL TARTRATE 50 MG TABS (METOPROLOL TARTRATE) 1 by mouth two times a day  #180 x 3   Entered by:   Sherri Rad, RN, BSN   Authorized by:   Lenoria Farrier, MD, [facility]   Signed by:   Sherri Rad, RN, BSN on 05/25/2009   Method used:   Print then Give to Patient   RxID:   [id number] PRAVACHOL 20 MG TABS (PRAVASTATIN SODIUM) 1 by mouth once daily  #30 x 6   Entered by:   Sherri Rad, RN, BSN   Authorized by:   Lenoria Farrier, MD, [facility]   Signed by:   Sherri Rad, RN, BSN on 05/25/2009   Method used:   Print then Give to Patient   RxID:   [id number] ZETIA 10 MG TABS (EZETIMIBE) Take one tablet by mouth daily.  #30 x 6   Entered by:   Sherri Rad, RN, BSN   Authorized by:   Lenoria Farrier, MD, [facility]   Signed  by:   Sherri Rad, RN, BSN on 05/25/2009   Method used:   Print then Give to Patient   RxID:   [id number] COZAAR 50 MG TABS (LOSARTAN POTASSIUM) 1 by mouth once daily  #30 x 6   Entered by:   Sherri Rad, RN, BSN   Authorized by:   Lenoria Farrier, MD, [facility]   Signed by:   Sherri Rad, RN, BSN on 05/25/2009   Method used:   Print then Give to Patient   RxID:   [id number] METOPROLOL TARTRATE 50 MG TABS (METOPROLOL TARTRATE) 1 by mouth two times a day  #60 x 6   Entered by:   Sherri Rad, RN, BSN   Authorized by:   Lenoria Farrier, MD, [facility]   Signed by:   Sherri Rad, RN, BSN on 05/25/2009   Method used:   Print then Give to Patient   RxID:   [id number]

## 2010-05-20 NOTE — Progress Notes (Signed)
Summary: clarify cardiac medication   Phone Note From Other Clinic   Caller: debbie office [phone] Request: Talk with Nurse Details for Reason: pls clarify cardiac medication for pt .  Initial call taken by: Lorne Skeens,  May 18, 2009 10:21 AM  Follow-up for Phone Call        I called and spoke with Eunice Blase- per Eunice Blase, they have just refilled the pt's cozaar. Their office had on file that the pt was taking cozaar 25mg  once daily. The pt picked up this medication and realized it was the wrong dose. Debbie called here to clarify- I explained that the pt is taking Cozaar 50mg  once daily. Eunice Blase will contact [facility] about the incorrect dose. I will go ahead and send in a corrected RX to [facility] in [place] for Cozaar 50mg  once daily so his next refill will be directed to cardiology. Sherri Rad, RN, BSN  May 18, 2009 10:56 AM     Prescriptions: COZAAR 50 MG TABS (LOSARTAN POTASSIUM) 1 by mouth once daily  #30 x 6   Entered by:   Sherri Rad, RN, BSN   Authorized by:   Lenoria Farrier, MD, [facility]   Signed by:   Sherri Rad, RN, BSN on 05/18/2009   Method used:   Electronically to        [facility] (retail)       [address]       [place], [place]  [address]       Ph: [phone] or [phone]       Fax: [phone]   RxID:   [id number]

## 2010-05-20 NOTE — Letter (Signed)
Summary: Peripheral Vascular  [facility], Main Office  [address]   [place], [place] [address]   Phone: [phone]  Fax: [phone]     05/29/2009 MRN: [id number]  [name] [address] [place], [place]  [address]  Dear [name],   You are scheduled for Peripheral Vascular Angiogram on    June 11, 2009           with Dr.   Clifton James         .  Please arrive at the [facility] at   8:30    a.m. on the day of your procedure.  1. DIET     ___x_ Nothing to eat or drink after midnight except your medications with a sip of water.  2. Come to the [place] office on   June 08, 2009          for lab work.  The lab at [facility] is open from 8:30 a.m. to 1:30 p.m. and 2:30 p.m. to 5:00 p.m.  The lab at [address] [facility] is open from 7:30 a.m. to 5:30 p.m.  You do not have to be fasting.  3. MAKE SURE YOU TAKE YOUR ASPIRIN.  4. ___X__ DO NOT TAKE these medications before your procedure:         _Do not take Janumet evening before and morning of procedure and for 48 hours after procedure     __x__ YOU MAY TAKE ALL of your remaining medications with a small amount of water.      ____ START NEW medications:     ________________________________________________________________________________      ____ Eilene Ghazi instructions:     ________________________________________________________________________________  5. Plan for one night stay - bring personal belongings (i.e. toothpaste, toothbrush, etc.)  6. Bring a current list of your medications and current insurance cards.  7. Must have a responsible person to drive you home.   8. Someone must be with yu for the first 24 hours after you arrive home.  9. Please wear clothes that are easy to get on and off and wear slip-on shoes.  *Special note: Every effort is made to have your procedure done on time.  Occasionally there are emergencies that present  themselves at the hospital that may cause delays.  Please be patient if a delay does occur.  If you have any questions after you get home, please call the office at the number listed above.   Dossie Arbour, RN, BSN  Appended Document: Peripheral Vascular Called and spoke with pt's wife and told her to have pt hold Actos the AM of procedure.  She will let pt know.

## 2010-05-20 NOTE — Assessment & Plan Note (Signed)
Summary: npv      Allergies Added: NKDA  Visit Type:  new PV Primary Provider:  Dr. Maisie Fus [facility])  CC:  right leg pain..pt saw Dr. Charlies Constable this past Wed...edema/hands..sob...denies any cp.  History of Present Illness: 65 yo WM with history of CAD, ischemic cardiomyopathy, HTN, hyperlipidemia, ongoing tobacco abuse and known PVD with prior aortobifemoral bypass by Dr. Arbie Cookey in 1992 who is here today for PV evaluation.  He has been having right hip pain with radiation down to his right knee. No calf pain. The pain improves with rest. He did hurt his back in 2009 and the right leg pain started at that time. He has since had a back surgery in 9/10. This did not help his right leg pain. He recently had a nerve conduction study in the right leg and was told that it was abnormal. He has not followed up with his Neurosugeon (Dr. Yetta Barre) since that test. He had non-invasive dopplers done this week in our office. His ABI is 0.45 on the right and 0.56 on the left. The right limb of the aortofemoral bypass graft appears to be occluded. The left limb of the bypass is patent. There is dampened flow in the right CFA. Monophasic flow in the SFA and popliteal artery. Left SFA and popliteal with brisk flow. Right ATA and PTA waveforms are monophasic. Left ATA and PTA waveforms are biphasic. Probable tibial disease on left.   He continues to smoke. He is relatively limited by the pain in his right leg.   Current Medications (verified): 1)  Metoprolol Tartrate 50 Mg Tabs (Metoprolol Tartrate) .[name].. 1 By Mouth Two Times A Day 2)  Cozaar 50 Mg Tabs (Losartan Potassium) .[name].. 1 By Mouth Once Daily 3)  Zetia 10 Mg Tabs (Ezetimibe) .... Take One Tablet By Mouth Daily. 4)  Pravachol 20 Mg Tabs (Pravastatin Sodium) .[name].. 1 By Mouth Once Daily 5)  Aspirin 81 Mg Tbec (Aspirin) .... Take One Tablet By Mouth Daily 6)  Fish Oil Maximum Strength 1200 Mg Caps (Omega-3 Fatty Acids) .... 1000mg  Once Daily 7)  Janumet  50-1000 Mg Tabs (Sitagliptin-Metformin Hcl) .[name].. 1 By Mouth Once Two Times A Day 8)  Actos 30 Mg Tabs (Pioglitazone Hcl) .[name].. 1 By Mouth Daily 9)  Ciprofloxacin Hcl 750 Mg Tabs (Ciprofloxacin Hcl) .[name].. 1 By Mouth Two Times A Day 10)  Spiriva Handihaler 18 Mcg Caps (Tiotropium Bromide Monohydrate) .... As Directed 11)  Nasonex 50 Mcg/act Susp (Mometasone Furoate) .... As Directed 12)  Proventil Hfa 108 (90 Base) Mcg/act Aers (Albuterol Sulfate) .... As Directed  Allergies (verified): No Known Drug Allergies  Past History:  Past Medical History: 1. Coronary artery disease, status post DMI Rx Taxus stent RCA 2006 with subsequent Stent LAD and      subsequent stent thrombosis RCA unable to be opened 2006-- negative mv 10/2008 2. Ischemic cardiomyopathy, ejection fraction of 30%-35% by echo 11/2008. 3. Hyperlipidemia. 4. Peripheral vascular disease, status post bilateral femoral- popliteal bypass graft surgery 1992. 5. Hypertension. 6. Congestive heart failure, class II/III (systolic). 7. Continued cigarette use.  8.  LSS disease 9. Diabetes  Past Surgical History:  Taxus stent placed into his right coronary artery  stent placed to  the LAD.   Aorto-bifemoral  bypass graft surgery 1992.  Appendectomy  Left shoulder surgery   Lumbar microdiscectomy 12/31/08  Family History: Reviewed history from 01/07/2009 and no changes required. +CAD, DM denies FH of sudden death or arrhythmia Mother alive, Cardiomyopathy Father-deceased, CAD,CVA  Social History: Reviewed history from 01/07/2009 and no changes required. Single.  One daughter.  Continues to smoke 3/4ppd (had smoked 2-3 ppd). x 50 years. Occasional EtOH. The patient lives with his girlfriend in [place], [place].  He is a retired Naval architect.  Review of Systems  The patient denies fatigue, malaise, fever, weight gain/loss, vision loss, decreased hearing, hoarseness, chest pain, palpitations, shortness of breath, prolonged cough,  wheezing, sleep apnea, coughing up blood, abdominal pain, blood in stool, nausea, vomiting, diarrhea, heartburn, incontinence, blood in urine, muscle weakness, joint pain, leg swelling, rash, skin lesions, headache, fainting, dizziness, depression, anxiety, enlarged lymph nodes, easy bruising or bleeding, and environmental allergies.         See HPI. Right leg pain.   Vital Signs:  Patient profile:   65 year old male Height:      67 inches Weight:      194 pounds BMI:     30.49 Pulse rate:   82 / minute Pulse rhythm:   regular BP sitting:   140 / 80  (left arm) Cuff size:   large  Vitals Entered By: Danielle Rankin, CMA (May 29, 2009 11:08 AM)  Physical Exam  General:  General: Well developed, well nourished, NAD HEENT: OP clear, mucus membranes moist SKIN: warm, dry Neuro: No focal deficits Musculoskeletal: Muscle strength 5/5 all ext Psychiatric: Mood and affect normal Neck: No JVD, no carotid bruits, no thyromegaly, no lymphadenopathy. Lungs:Clear bilaterally, no wheezes, rhonci, crackles CV: RRR no murmurs, gallops rubs Abdomen: soft, NT, ND, BS present Extremities: No edema, pulses non-palpable bilateral DP/PT. Hand held doppler with dampened signals.     Arterial Doppler  Procedure date:  05/27/2009  Findings:      ABI 0.45 on right. 0.59 on left.  Right limb of aortobifem graft occluded. Left limb patent.  Monophasic flow right CFA throughout SFA,popliteal, ATA and PTA Left SFA biphasic wtih possilbe tibial disease.   Impression & Recommendations:  Problem # 1:  PVD (ICD-443.9) If is difficult to say if his right leg pain is secondary to his back or from his PVD. Non-invasive studies are suggestive of progression of his right lower ext disease. Will plan distal aortogram with lower extremity runoff on February 24th at [facility]. Will get BMET, CBC and coags week of procedure. Will start with angiogram and then may need assistance of Dr. Arbie Cookey and  Vascular Surgery if no good percutanious options.   Problem # 2:  TOBACCO ABUSE (ICD-305.1) Cessation advised.   Other Orders: PV Procedure (PV Procedure)  Patient Instructions: 1)  Your physician recommends that you schedule a follow-up appointment in: 4 weeks 2)  Your physician recommends that you return for lab work on June 08, 2009  BMP,CBC,PT,PTT-443.9 3)  Your physician has requested that you have a peripheral vascular angiogram. This exam is performed at the hospital. During this exam IV contrast is used to look at arterial blood flow.  Please review the information sheet given for details.

## 2010-05-20 NOTE — Progress Notes (Signed)
Summary: pt needs samples   Phone Note Call from Patient Call back at Home Phone [phone]   Caller: Spouse Reason for Call: Talk to Nurse, Talk to Doctor Summary of Call: pt has lost his insurance and was wondering did we have any samples Initial call taken by: Omer Jack,  October 15, 2009 3:32 PM  Follow-up for Phone Call        Spoke with wife. Pt lost job and she is requesting any samples of pt's meds that we may have in office. Only med we have is Zetia 10 mg. I told wife I would leave 28 tablets of this at front desk to be picked up.   Dossie Arbour, RN, BSN  October 15, 2009 4:28 PM  Follow-up by: Dossie Arbour, RN, BSN,  October 15, 2009 4:28 PM

## 2010-06-09 NOTE — Progress Notes (Signed)
Summary: [facility]: Office Visit  [facility]: Office Visit   Imported By: Earl Many 06/01/2010 17:43:46  _____________________________________________________________________  External Attachment:    Type:   Image     Comment:   External Document

## 2010-07-07 LAB — URINE MICROSCOPIC-ADD ON

## 2010-07-07 LAB — URINALYSIS, ROUTINE W REFLEX MICROSCOPIC
Bilirubin Urine: NEGATIVE
Glucose, UA: NEGATIVE mg/dL
Ketones, ur: NEGATIVE mg/dL
Nitrite: NEGATIVE
Protein, ur: 30 mg/dL — AB
Specific Gravity, Urine: 1.031 — ABNORMAL HIGH (ref 1.005–1.030)
Urobilinogen, UA: 0.2 mg/dL (ref 0.0–1.0)
pH: 5.5 (ref 5.0–8.0)

## 2010-07-07 LAB — BASIC METABOLIC PANEL
CO2: 27 mEq/L (ref 19–32)
GFR calc Af Amer: >60 mL/min (ref >60)
GFR calc non Af Amer: >60 mL/min (ref >60)
Glucose, Bld: 123 mg/dL — ABNORMAL HIGH (ref 70–99)
Potassium: 3.8 mEq/L (ref 3.5–5.1)
Sodium: 133 mEq/L — ABNORMAL LOW (ref 135–145)

## 2010-07-07 LAB — PROTIME-INR
INR: 1.04 (ref 0.00–1.49)
Prothrombin Time: 13.5 seconds (ref 11.6–15.2)

## 2010-07-07 LAB — COMPREHENSIVE METABOLIC PANEL
ALT: 19 U/L (ref 0–53)
AST: 19 U/L (ref 0–37)
Albumin: 4.1 g/dL (ref 3.5–5.2)
Alkaline Phosphatase: 51 U/L (ref 39–117)
BUN: 17 mg/dL (ref 6–23)
CO2: 25 mEq/L (ref 19–32)
Calcium: 9.3 mg/dL (ref 8.4–10.5)
Chloride: 106 mEq/L (ref 96–112)
Creatinine, Ser: 0.82 mg/dL (ref 0.4–1.5)
GFR calc Af Amer: >60 mL/min (ref >60)
GFR calc non Af Amer: >60 mL/min (ref >60)
Glucose, Bld: 142 mg/dL — ABNORMAL HIGH (ref 70–99)
Potassium: 3.9 mEq/L (ref 3.5–5.1)
Sodium: 138 mEq/L (ref 135–145)
Total Bilirubin: 0.9 mg/dL (ref 0.3–1.2)
Total Protein: 7 g/dL (ref 6.0–8.3)

## 2010-07-07 LAB — CBC
HCT: 35.8 % — ABNORMAL LOW (ref 39.0–52.0)
HCT: 45.8 % (ref 39.0–52.0)
Hemoglobin: 12.2 g/dL — ABNORMAL LOW (ref 13.0–17.0)
Hemoglobin: 15.6 g/dL (ref 13.0–17.0)
MCHC: 34 g/dL (ref 30.0–36.0)
MCV: 93.3 fL (ref 78.0–100.0)
Platelets: 237 10*3/uL (ref 150–400)
RBC: 3.8 MIL/uL — ABNORMAL LOW (ref 4.22–5.81)
RBC: 4.91 MIL/uL (ref 4.22–5.81)
RDW: 13.9 % (ref 11.5–15.5)
RDW: 14.1 % (ref 11.5–15.5)
WBC: 7.4 10*3/uL (ref 4.0–10.5)

## 2010-07-07 LAB — TYPE AND SCREEN
ABO/RH(D): O NEG
Antibody Screen: NEGATIVE

## 2010-07-07 LAB — GLUCOSE, CAPILLARY
Glucose-Capillary: 121 mg/dL — ABNORMAL HIGH (ref 70–99)
Glucose-Capillary: 122 mg/dL — ABNORMAL HIGH (ref 70–99)
Glucose-Capillary: 136 mg/dL — ABNORMAL HIGH (ref 70–99)
Glucose-Capillary: 144 mg/dL — ABNORMAL HIGH (ref 70–99)
Glucose-Capillary: 155 mg/dL — ABNORMAL HIGH (ref 70–99)
Glucose-Capillary: 162 mg/dL — ABNORMAL HIGH (ref 70–99)
Glucose-Capillary: 166 mg/dL — ABNORMAL HIGH (ref 70–99)

## 2010-07-07 LAB — APTT: aPTT: 32 seconds (ref 24–37)

## 2010-07-07 LAB — MRSA PCR SCREENING: MRSA by PCR: NEGATIVE

## 2010-07-11 LAB — TYPE AND SCREEN: ABO/RH(D): O NEG

## 2010-07-11 LAB — COMPREHENSIVE METABOLIC PANEL
ALT: 24 U/L (ref 0–53)
AST: 23 U/L (ref 0–37)
Albumin: 3.9 g/dL (ref 3.5–5.2)
CO2: 24 mEq/L (ref 19–32)
Calcium: 9.7 mg/dL (ref 8.4–10.5)
Creatinine, Ser: 0.96 mg/dL (ref 0.4–1.5)
GFR calc Af Amer: >60 mL/min (ref >60)
Sodium: 138 mEq/L (ref 135–145)
Total Protein: 6.9 g/dL (ref 6.0–8.3)

## 2010-07-11 LAB — URINALYSIS, ROUTINE W REFLEX MICROSCOPIC
Glucose, UA: NEGATIVE mg/dL
Ketones, ur: NEGATIVE mg/dL
Nitrite: NEGATIVE
pH: 5 (ref 5.0–8.0)

## 2010-07-11 LAB — ABO/RH: ABO/RH(D): O NEG

## 2010-07-11 LAB — CBC
MCHC: 34.2 g/dL (ref 30.0–36.0)
MCV: 93.7 fL (ref 78.0–100.0)
Platelets: 237 10*3/uL (ref 150–400)
RBC: 4.88 MIL/uL (ref 4.22–5.81)
RDW: 13.8 % (ref 11.5–15.5)

## 2010-07-11 LAB — APTT: aPTT: 31 seconds (ref 24–37)

## 2010-07-19 ENCOUNTER — Other Ambulatory Visit (HOSPITAL_COMMUNITY): Payer: Self-pay | Admitting: Neurological Surgery

## 2010-07-19 ENCOUNTER — Telehealth: Payer: Self-pay | Admitting: Cardiovascular Disease

## 2010-07-19 ENCOUNTER — Ambulatory Visit (HOSPITAL_COMMUNITY)
Admission: RE | Admit: 2010-07-19 | Discharge: 2010-07-19 | Disposition: A | Discharge status: Home / Self Care | Payer: Worker's Compensation | Source: Ambulatory Visit | Attending: Neurological Surgery | Admitting: Neurological Surgery

## 2010-07-19 ENCOUNTER — Encounter (HOSPITAL_COMMUNITY)
Admission: RE | Admit: 2010-07-19 | Discharge: 2010-07-19 | Disposition: A | Discharge status: Home / Self Care | Payer: Worker's Compensation | Source: Ambulatory Visit | Attending: Neurological Surgery | Admitting: Neurological Surgery

## 2010-07-19 DIAGNOSIS — Z01812 Encounter for preprocedural laboratory examination: Secondary | ICD-10-CM | POA: Insufficient documentation

## 2010-07-19 DIAGNOSIS — Z01818 Encounter for other preprocedural examination: Secondary | ICD-10-CM | POA: Insufficient documentation

## 2010-07-19 DIAGNOSIS — M51379 Other intervertebral disc degeneration, lumbosacral region without mention of lumbar back pain or lower extremity pain: Secondary | ICD-10-CM | POA: Insufficient documentation

## 2010-07-19 DIAGNOSIS — Z01810 Encounter for preprocedural cardiovascular examination: Secondary | ICD-10-CM | POA: Insufficient documentation

## 2010-07-19 DIAGNOSIS — M5136 Other intervertebral disc degeneration, lumbar region: Secondary | ICD-10-CM

## 2010-07-19 DIAGNOSIS — M5137 Other intervertebral disc degeneration, lumbosacral region: Secondary | ICD-10-CM | POA: Insufficient documentation

## 2010-07-19 LAB — DIFFERENTIAL
Basophils Absolute: 0 10*3/uL (ref 0.0–0.1)
Eosinophils Absolute: 0.1 10*3/uL (ref 0.0–0.7)
Eosinophils Relative: 1 % (ref 0–5)
Lymphocytes Relative: 25 % (ref 12–46)
Lymphs Abs: 2 10*3/uL (ref 0.7–4.0)
Monocytes Absolute: 0.6 10*3/uL (ref 0.1–1.0)

## 2010-07-19 LAB — CBC
HCT: 45.2 % (ref 39.0–52.0)
MCHC: 34.1 g/dL (ref 30.0–36.0)
MCV: 91.7 fL (ref 78.0–100.0)
Platelets: 188 10*3/uL (ref 150–400)
RDW: 13.5 % (ref 11.5–15.5)
WBC: 7.8 10*3/uL (ref 4.0–10.5)

## 2010-07-19 LAB — BASIC METABOLIC PANEL
BUN: 14 mg/dL (ref 6–23)
Chloride: 102 mEq/L (ref 96–112)
Creatinine, Ser: 1 mg/dL (ref 0.4–1.5)
Glucose, Bld: 124 mg/dL — ABNORMAL HIGH (ref 70–99)
Potassium: 4.7 mEq/L (ref 3.5–5.1)

## 2010-07-19 NOTE — Telephone Encounter (Signed)
Stress,LOV faxed to [facility]Jenny @ [phone] 07/19/10/KM

## 2010-07-23 LAB — GLUCOSE, CAPILLARY

## 2010-07-23 LAB — DIFFERENTIAL
Basophils Relative: 0 % (ref 0–1)
Eosinophils Absolute: 0.1 10*3/uL (ref 0.0–0.7)
Lymphs Abs: 1.7 10*3/uL (ref 0.7–4.0)
Neutro Abs: 5.5 10*3/uL (ref 1.7–7.7)
Neutrophils Relative %: 71 % (ref 43–77)

## 2010-07-23 LAB — CBC
HCT: 46.3 % (ref 39.0–52.0)
Hemoglobin: 15.6 g/dL (ref 13.0–17.0)
MCV: 94.8 fL (ref 78.0–100.0)
Platelets: 190 10*3/uL (ref 150–400)
RBC: 4.89 MIL/uL (ref 4.22–5.81)
WBC: 7.8 10*3/uL (ref 4.0–10.5)

## 2010-07-23 LAB — BASIC METABOLIC PANEL
Chloride: 107 mEq/L (ref 96–112)
GFR calc Af Amer: >60 mL/min (ref >60)
GFR calc non Af Amer: >60 mL/min (ref >60)
Potassium: 4.6 mEq/L (ref 3.5–5.1)
Sodium: 140 mEq/L (ref 135–145)

## 2010-07-23 LAB — PROTIME-INR
INR: 1 (ref 0.00–1.49)
Prothrombin Time: 13.2 seconds (ref 11.6–15.2)

## 2010-07-23 LAB — APTT: aPTT: 30 seconds (ref 24–37)

## 2010-07-28 ENCOUNTER — Inpatient Hospital Stay (HOSPITAL_COMMUNITY): Payer: Worker's Compensation

## 2010-07-28 ENCOUNTER — Inpatient Hospital Stay (HOSPITAL_COMMUNITY)
Admission: RE | Admit: 2010-07-28 | Discharge: 2010-07-30 | DRG: 460 | Disposition: A | Discharge status: Home / Self Care | Payer: Worker's Compensation | Source: Ambulatory Visit | Attending: Neurological Surgery | Admitting: Neurological Surgery

## 2010-07-28 DIAGNOSIS — E119 Type 2 diabetes mellitus without complications: Secondary | ICD-10-CM | POA: Diagnosis present

## 2010-07-28 DIAGNOSIS — M47817 Spondylosis without myelopathy or radiculopathy, lumbosacral region: Secondary | ICD-10-CM | POA: Diagnosis present

## 2010-07-28 DIAGNOSIS — M5126 Other intervertebral disc displacement, lumbar region: Principal | ICD-10-CM | POA: Diagnosis present

## 2010-07-28 DIAGNOSIS — M51379 Other intervertebral disc degeneration, lumbosacral region without mention of lumbar back pain or lower extremity pain: Secondary | ICD-10-CM | POA: Diagnosis present

## 2010-07-28 DIAGNOSIS — J449 Chronic obstructive pulmonary disease, unspecified: Secondary | ICD-10-CM | POA: Diagnosis present

## 2010-07-28 DIAGNOSIS — M5137 Other intervertebral disc degeneration, lumbosacral region: Secondary | ICD-10-CM | POA: Diagnosis present

## 2010-07-28 DIAGNOSIS — I2510 Atherosclerotic heart disease of native coronary artery without angina pectoris: Secondary | ICD-10-CM | POA: Diagnosis present

## 2010-07-28 DIAGNOSIS — F17200 Nicotine dependence, unspecified, uncomplicated: Secondary | ICD-10-CM | POA: Diagnosis present

## 2010-07-28 DIAGNOSIS — J4489 Other specified chronic obstructive pulmonary disease: Secondary | ICD-10-CM | POA: Diagnosis present

## 2010-07-28 DIAGNOSIS — Z8551 Personal history of malignant neoplasm of bladder: Secondary | ICD-10-CM

## 2010-07-28 LAB — GLUCOSE, CAPILLARY: Glucose-Capillary: 244 mg/dL — ABNORMAL HIGH (ref 70–99)

## 2010-07-28 LAB — TYPE AND SCREEN: ABO/RH(D): O NEG

## 2010-07-28 IMAGING — RF DG LUMBAR SPINE 2-3V
1 series · 2 of 2 positions shown · non-contrast
Comparison: [DATE]

CLINICAL DATA: L3-4 XLIF

LUMBAR SPINE - 2-3 VIEW

[Series 1: run · 2 of 2 slices shown]
[im 1/2]
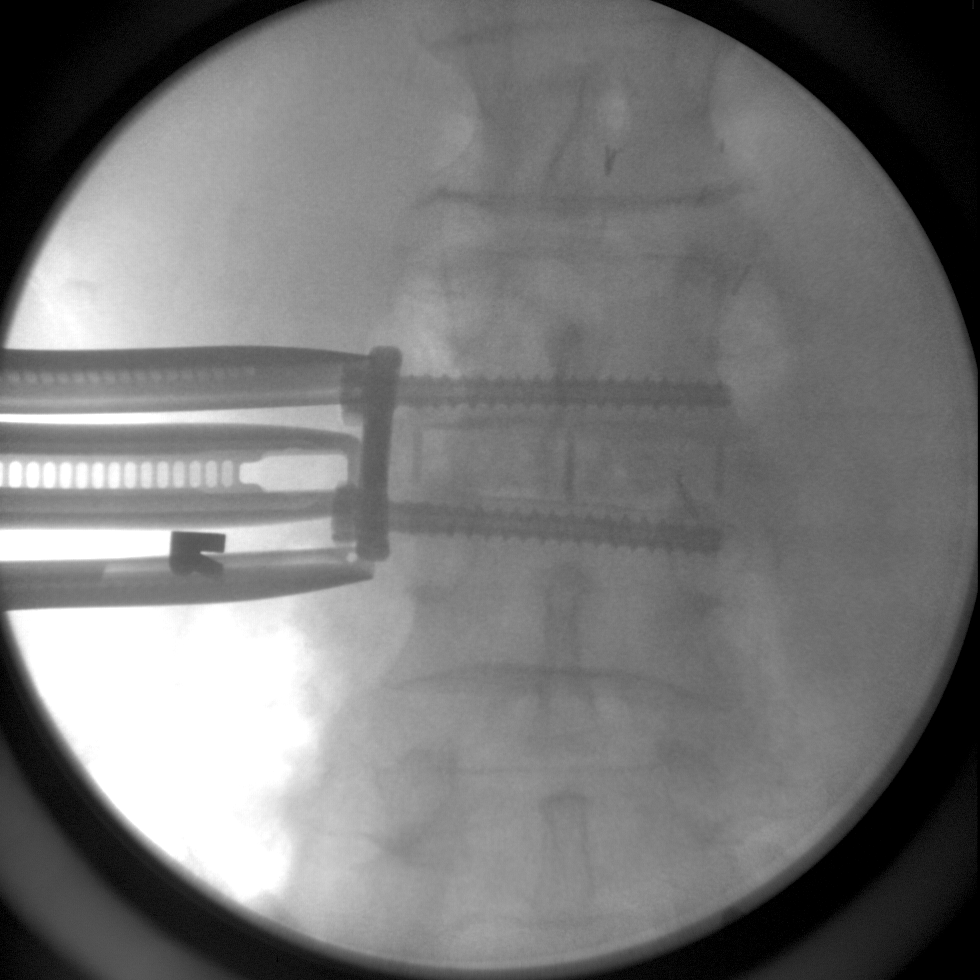
[im 2/2]
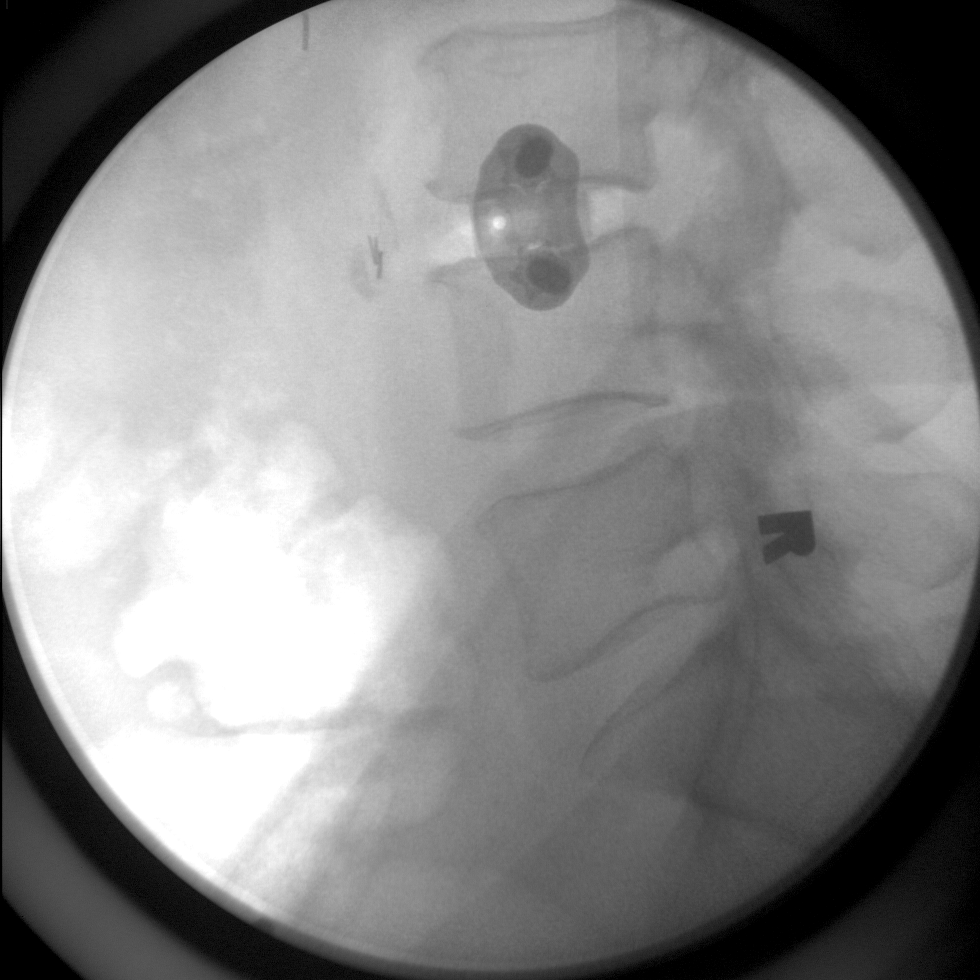

[2 of 2 positions shown; findings below may reference images not displayed]

FINDINGS: Two spot fluoro images are submitted after procedure for
review.  First image shows a lateral plate and screw fixation
device with back at the level of L3-4.  Lateral film confirms the
L3-4 placement.
IMPRESSION: Status post lateral lumbar discectomy and fusion at L3-4.  No
evidence for immediate hardware complications.

## 2010-07-29 LAB — GLUCOSE, CAPILLARY

## 2010-07-30 LAB — POCT I-STAT GLUCOSE
Glucose, Bld: 112 mg/dL — ABNORMAL HIGH (ref 70–99)
Operator id: 230421

## 2010-07-30 LAB — GLUCOSE, CAPILLARY
Glucose-Capillary: 134 mg/dL — ABNORMAL HIGH (ref 70–99)
Glucose-Capillary: 127 mg/dL — ABNORMAL HIGH (ref 70–99)

## 2010-08-02 NOTE — Discharge Summary (Signed)
  [name], [name]                 ACCOUNT NO.:  [id number]  MEDICAL RECORD NO.:  [id number]           PATIENT TYPE:  I  LOCATION:  3534                         FACILITY:  [facility]  PHYSICIAN:  Tia Alert, MD     DATE OF BIRTH:  February 18, 1946  DATE OF ADMISSION:  07/28/2010 DATE OF DISCHARGE:  07/30/2010                              DISCHARGE SUMMARY   ADMITTING DIAGNOSIS:  Degenerative disk disease with recurrent disk herniation L3-4, back and right leg pain.  His procedure is anterolateral retroperitoneal interbody fusion L3-4 with lateral plating.  BRIEF HISTORY OF PRESENT ILLNESS:  Mr. Swor is a very pleasant gentleman who underwent a previous extraforaminal diskectomy at L3-4. He had a return of his pain.  He was found to have a progressing spondylosis at L3-4 with degenerative disk disease and collapse of disk space and progressive foraminal stenosis.  Recommended anterolateral retroperitoneal interbody fusion in hopes of improving his pain syndrome.  He understood the risks, benefits and outcome and wished to proceed.  DESCRIPTION OF THE PROCEDURE:  The patient was admitted on July 28, 2010, taken to operating room to underwent an XLIF at L3-4.  The patient tolerated the procedure well.  She was taken to the recovery  room and then to the floor in stable condition.  For details of the operative procedure, please see the dictated operative note.  The patient's hospital course routine.  There were no complications.  His pain was fairly well controlled.  He had appropriate back soreness.  His PCA was discontinued on postop day #1.  His pain was well-controlled on oral pain medications.  He was afebrile with stable vital signs, had good strength in his lower extremities, ambulating and voiding well. He was discharged home in stable condition on July 30, 2010, plans to follow up in 2 weeks.  FINAL DIAGNOSIS:  Anterolateral retroperitoneal interbody fusion  L3-4.     Tia Alert, MD     DSJ/MEDQ  D:  07/30/2010  T:  07/31/2010  Job:  [id number]  Electronically Signed by Marikay Alar MD on 08/01/2010 12:58:28 PM

## 2010-08-02 NOTE — Op Note (Signed)
NAMEDANIEL, RITTHALER                 ACCOUNT NO.:  [id number]  MEDICAL RECORD NO.:  [id number]           PATIENT TYPE:  I  LOCATION:  3534                         FACILITY:  [facility]  PHYSICIAN:  Tia Alert, MD     DATE OF BIRTH:  November 20, 1945  DATE OF PROCEDURE:  07/28/2010 DATE OF DISCHARGE:                              OPERATIVE REPORT   PREOPERATIVE DIAGNOSES: 1. Degenerative disk disease, L3-4. 2. Recurrent foraminal stenosis, L3-4 on the right with collapse of     the disk space on the right. 3. Herniated nucleus pulposus, L3-4. 4. Back pain. 5. Right leg pain.  POSTOPERATIVE DIAGNOSES: 1. Degenerative disk disease, L3-4. 2. Recurrent foraminal stenosis, L3-4 on the right with collapse of     the disk space on the right. 3. Herniated nucleus pulposus, L3-4. 4. Back pain. 5. Right leg pain.  PROCEDURES: 1. Anterolateral retroperitoneal interbody fusion L3-4 utilizing a 10     x 22 x 50 mm PEEK interbody cage packed with Osteocel Plus and     Actifuse putty. 2. Lateral plating L3-4 utilizing the XLP plate with 16-XW bolts.  SURGEON:  Tia Alert, MD  ANESTHESIA:  General endotracheal.  COMPLICATIONS:  None apparent.  INDICATIONS FOR PROCEDURE:  Mr. Cumpian is a 65 year old gentleman who presented with recurrent right anterior thigh pain with associated back pain.  He underwent extraforaminal diskectomy at L3-4 on the right a couple of years ago.  He had recurrent leg pain.  He had worsening back pain.  He had an MRI which when compared to previous MRI showed progressive degeneration and desiccation of L3-4 disk with a new disk protrusion L3-4 with new foraminal stenosis secondary to asymmetric collapse of the disk space with spurring of the lateral part of the vertebral body compression the right L3 nerve root.  I recommended an anterolateral retroperitoneal interbody fusion at L3-4 to reestablish foraminal cross-sectional area and to fuse the degenerating disk  in hopes of improving his pain syndrome.  He understood the risks, benefits, and expected outcome of this procedure and wished to proceed.  DESCRIPTION OF PROCEDURE:  The patient was taken to the operating room and after induction of adequate generalized endotracheal anesthesia, he was placed in the left lateral decubitus position exposing his right side.  He was positioned and then taped in the usual XLIF fashion exposing his right side and we used AP and lateral fluoroscopy to position him getting orthogonal views.  I then cleaned the skin with Hibiclens and then prepped him with DuraPrep and then draped in usual sterile fashion.  A lateral incision was made just over the L3-4 interspace and a posterolateral incision was made just posterolateral to this.  Blunt finger dissection was used to enter the retroperitoneal space.  I was able to sweep the fatty tissues in the retroperitoneal space.  I could feel the psoas.  I could feel the anterior face of the transverse process.  I swept my finger up to the lateral incision and then passed my first dilator along my fingertip down to the psoas muscle.  I then checked my twitch  test, we had good readings.  I then placed my first dilator over the L3-4 interspace and checked our EMG monitoring with good readings and then passed my sequential dilators utilizing intraoperative EMG until our final retractor was in place.  I then positioned the retractor with AP and lateral fluoroscopy, used a ball probe to make sure there was no neural structures in the working channel and then placed my intradiskal shim to hold the retractor in place.  I then was able to remove the lateral osteophyte.  This was a large overgrown osteophyte.  I was able to remove it, incise the disk space, perform a thorough intradiskal diskectomy with pituitary rongeurs and then used Cobb curette to release the disk from the endplates and release the opposite annulus.  I then  scraped my endplates with scrapers and shavers until they were prepared for arthrodesis.  I then passed my 16-mm paddle across the opposite annulus to further open it and checked with AP and lateral fluoroscopy to assure my trajectory.  I then used sequential trials utilizing 8 standard, the 10 lordotic, and finally the 10 standard trial, which seemed to fit the best.  We therefore used a 10 x 22 mm x 50 mm PEEK interbody cage.  We packed this with Osteocel Plus and Actifuse putty after soaking the cage in bacitracin saline solution. We then checked our trial with AP and lateral fluoroscopy.  We placed our sliders into the disk space and then tapped RP graft into position at L3-4 utilizing AP fluoroscopy.  Once it was in position, we dropped our guide for our lateral plate, the 8 guide worked the best.  I was able to awl through the guide and then drill through the guide and then placed 50-mm bolts through the guide.  We then dropped our plate over the bolts and placed our locking knots and then tied these into position with anti-torque device.  We then checked our final construct with AP and lateral fluoroscopy.  I irrigated with copious amounts of bacitracin containing saline solution, closed my retractor, looked for any bleeding, found none, and removed my retractor looking through the entire time to make sure there was no bleeding.  I then checked my final construct once again with AP and lateral fluoroscopy with the retractor removed.  I then closed in layers of 0-Vicryl in the fascia, 2-0 Vicryl in the subcutaneous tissues, 3-0 Vicryl in the subcuticular tissues, and Dermabond on the skin.  The drapes were removed.  Sterile dressing was applied.  The patient was awakened from general anesthesia and transferred to the recovery room in a stable condition.  At the end of the procedure, all sponge, needle, and instrument counts were correct.     Tia Alert, MD     DSJ/MEDQ   D:  07/28/2010  T:  07/29/2010  Job:  [id number]  Electronically Signed by Marikay Alar MD on 08/01/2010 12:58:25 PM

## 2010-08-31 ENCOUNTER — Ambulatory Visit
Admission: RE | Admit: 2010-08-31 | Discharge: 2010-08-31 | Disposition: A | Discharge status: Home / Self Care | Payer: Worker's Compensation | Source: Ambulatory Visit | Attending: Neurological Surgery | Admitting: Neurological Surgery

## 2010-08-31 ENCOUNTER — Other Ambulatory Visit: Payer: Self-pay | Admitting: Neurological Surgery

## 2010-08-31 DIAGNOSIS — M48061 Spinal stenosis, lumbar region without neurogenic claudication: Secondary | ICD-10-CM

## 2010-08-31 DIAGNOSIS — M79609 Pain in unspecified limb: Secondary | ICD-10-CM

## 2010-08-31 DIAGNOSIS — M5137 Other intervertebral disc degeneration, lumbosacral region: Secondary | ICD-10-CM

## 2010-08-31 DIAGNOSIS — M5126 Other intervertebral disc displacement, lumbar region: Secondary | ICD-10-CM

## 2010-08-31 NOTE — Assessment & Plan Note (Signed)
[facility]                            CARDIOLOGY OFFICE NOTE   NAME:COLEYAlekai, Pocock                        MRN:          [id number]  DATE:11/07/2006                            DOB:          Jul 03, 1945    PRIMARY CARE PHYSICIAN:  Elvera Bicker, [facility].   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  He had a Taxus stent  placed to his right coronary in 2006 for an acute MI, and subsequently  had a stent in the LAD.  In September of 2006, he had stent stenosis to  the right coronary which was unable to be reopened.   He has done reasonably well since that time.  He has not had any recent  chest pain or palpitations, but he does get short of breath easily with  exertion, and he says he has had quite a bit of fatigue.  He is still  driving his truck, but he does not do any loading.   PAST MEDICAL HISTORY:  Significant for hyperlipidemia, previous tobacco  use with still some use, and previous bilateral fem-pop bypass graft  surgery.   CURRENT MEDICATIONS:  1. Cozaar (been intolerant to Altace with cough).  2. Aspirin.  3. Zetia.  4. Pravachol.  5. Fish oil.  6. Metoprolol.  7. Avodart.  8. Flomax.   PHYSICAL EXAMINATION:  The blood pressure was 144/88 and the pulse 70  and regular.  There was no venous distension.  The carotid pulses were full without bruits.  CHEST:  Clear.  Cardiac rhythm with was regular, I heard no murmurs or gallops.  ABDOMEN:  Soft but protuberant.  There is no hepatosplenomegaly.  The  bowel sounds were normal.  Peripheral pulses were full and there was no peripheral edema.   The electrocardiogram showed nonspecific T-wave change in the inferior  leads which had not changed.   IMPRESSION:  1. Coronary artery disease, status post prior myocardial infarctions      and percutaneous coronary interventions as described above.  2. Ejection fraction 35% to 40%.  3. Hyperlipidemia.  4.  Peripheral vascular disease, status post bilateral fem-pop bypass      graft surgery.  5. Hypertension.  6. Congestive heart failure class II secondary to systolic      dysfunction.  7. Acute bronchitis.  8. Continued cigarette use.   RECOMMENDATIONS:  Mr. Dilauro main symptom is related to fatigue.  I  will plan to get an echocardiogram to reevaluate his left ventricular  function.  Will increase his Cozaar from 25 to 50 a day.  Will give him  a course of Z-pak for his bronchitis and urged him to cut back and quit  his smoking.  Will get laboratory studies to evaluate his fatigue,  including a CBC, BMP, BNP, TSH, and lipid and liver.  I will plan to see  him back in 6 months.     Bruce Elvera Lennox Juanda Chance, MD, [facility]  Electronically Signed    BRB/MedQ  DD: 11/07/2006  DT: 11/07/2006  Job #: [id number]

## 2010-08-31 NOTE — Assessment & Plan Note (Signed)
[facility]                            CARDIOLOGY OFFICE NOTE   NAME:COLEYFredy, Gladu                        MRN:          [id number]  DATE:10/26/2007                            DOB:          05-13-1945    PRIMARY CARE PHYSICIAN:  Mee Hives, PA, [facility] in [place].   CLINICAL HISTORY:  Mr. Monts is 65 years old and returns for followup  management of his coronary heart disease.  In 2006, he had a Taxus stent  placed into his right coronary artery in the setting of an acute  diaphragmatic wall infarction.  Subsequently, he had a stent placed to  the LAD.  In September 2006, he had stent thrombosis to the right  coronary, but this was unable to be opened.   He has done fairly well from the standpoint of his heart recently.  He  has had no chest pain, shortness of breath, or palpitations.  We did an  echocardiogram last year and his ejection fraction was 30%-40%.   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  He went back to work on Wednesday, but his knee is still bothering him  quite a bit.   PAST MEDICAL HISTORY:  Significant for hyperlipidemia and peripheral  vascular disease, status post prior fem-pop bypass graft surgery.  He  also has a continued tobacco use.   CURRENT MEDICATIONS:  Cozaar, aspirin, Zetia, Pravachol, fish oil,  metoprolol, Avodart, and Flomax.   PHYSICAL EXAMINATION:  VITAL SIGNS:  The blood pressure is 117/73 and  the pulse 87 and regular.  NECK:  There was no venous distension.  The carotid pulses were full  without bruits.  CHEST:  Clear.  CARDIAC: Heart rhythm is regular.  No murmurs or gallops.  ABDOMEN:  Soft with organomegaly.  EXTREMITIES: There was a trace peripheral edema.   Electrocardiogram showed inferior T-wave inversions.   IMPRESSION:  1. Coronary artery disease,  status post prior myocardial infarctions      and percutaneous coronary intervention as described above.  2. Ischemic cardiomyopathy, ejection fraction of 30%-40%.  3. Hyperlipidemia.  4. Peripheral vascular disease, status post bilateral femoral-      popliteal bypass graft surgery.  5. Hypertension.  6. Congestive heart failure, class II (systolic).  7. Continued cigarette use.   RECOMMENDATIONS:  I think, Mr. Crissman is reasonably stable from the  cardiac standpoint.  We will plan to continue his current medications.  We will await for him to have a CBC, BNP, lipid and liver profile at his  doctor's office next week and to have him fax [place] the results.  I will  plan to see him back in a year.  We will plan to an echocardiogram prior  to that visit.     Bruce Elvera Lennox Juanda Chance, MD, [facility]  Electronically Signed    BRB/MedQ  DD: 10/26/2007  DT: 10/27/2007  Job #: [id number]
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
## 2010-08-31 NOTE — Procedures (Signed)
[name], [name]                 ACCOUNT NO.:  [id number]   MEDICAL RECORD NO.:  [id number]          PATIENT TYPE:  AMB   LOCATION:  SDS                          FACILITY:  [facility]   PHYSICIAN:  Verne Carrow, MDDATE OF BIRTH:  [birth year]   DATE OF PROCEDURE:  06/11/2009  DATE OF DISCHARGE:                    PERIPHERAL VASCULAR INVASIVE PROCEDURE   PRIMARY CARE PHYSICIAN:  Dr. Maisie Fus in [place].   PRIMARY CARDIOLOGIST:  Everardo Beals. Juanda Chance, MD.   PROCEDURES PERFORMED:  Distal aortogram with bilateral lower extremity  runoff including visualization of the aortobifemoral bypasses.   OPERATOR:  Verne Carrow, MD.   HISTORY:  This is a 65 year old Caucasian male with a history of  hypertension, hyperlipidemia, coronary artery disease, ischemic  cardiomyopathy, current tobacco abuse with known peripheral vascular  disease who underwent aortobifemoral bypass procedure in 1992 by Dr.  Gretta Began.  The patient has had recent worsening of right lower  extremity claudication.  Noninvasive studies suggested occlusion of the  right limb of the aortobifemoral bypass grafting.  Diagnostic procedure  was arranged for today.   DETAILS OF PROCEDURE:  The patient was brought to the main peripheral  vascular laboratory after signing informed consent for the procedure.  The left groin was prepped and draped in a sterile fashion.  Lidocaine  1% was used for local anesthesia.  A 5-French sheath was inserted into  the left femoral artery without difficulty.  A Versacore wire was used  to pass retrograde up into the aorta.  A pigtail catheter was then  passed over this wire.  Our first angiogram was performed with the  pigtail just proximal to the takeoff of the bilateral renal arteries.  I  then pulled the pigtail catheter down to the distal aorta just prior to  the bifurcation into the bypass grafts.  A pelvic angiogram was  performed in this projection.  We then did a bilateral lower  extremity  runoff.  The patient tolerated the procedure well.  He was taken to the  holding area in stable condition.   HEMODYNAMIC FINDINGS:  Central aortic pressure 143/67.   ANGIOGRAPHIC FINDINGS:  The aorta has mild plaque disease leading up to  the portion where the vessel has been ligated and the bypass grafts are  inserted.  The left limb of the aortobifemoral bypass is patent with  flow to the left femoral artery.  The right limb of the aortobifemoral  bypass is occluded in the proximal portion.  The right superficial  femoral artery fills faintly and is seen to be patent with minimal  disease.  The right popliteal artery is patent with mild plaque disease.  There appears to be three vessel runoff to the right foot, however,  there is poor filling because of the poor flow.  The left superficial  femoral artery and popliteal artery is patent with three vessel runoff  to the left foot.   IMPRESSION:  1. Chronic occlusion of the right limb of the aortobifemoral bypass      grafting.  2. Patent left limb of the aortobifemoral bypass grafting.  Minimal      disease below  the inguinal ligament in both legs.   RECOMMENDATIONS:  There are no good percutaneous options in this  gentleman.  I will make a referral to Dr. Arbie Cookey of Vascular Surgery to  consider further bypass grafting procedures.  The patient is stable at  this time.      Verne Carrow, MD     CM/MEDQ  D:  06/11/2009  T:  06/11/2009  Job:  [id number]   cc:   Maisie Fus, MD  Everardo Beals Juanda Chance, MD, [facility]

## 2010-08-31 NOTE — Assessment & Plan Note (Signed)
OFFICE VISIT   [name], [name]  DOB:  1945-06-07                                       06/19/2009  [id number]#:[id number]   The patient presents today for continued discussion regarding his right  leg claudication symptoms.  He is known to me from his prior  aortofemoral bypass grafting 19 years ago.  He reports that he has had  significant back discomfort and had had right leg symptoms that he  thought was attributable to the fall that occurred with his back injury.  He subsequently was found to have right lower extremity claudication and  underwent arteriography by Dr. Clifton James which I have for review.  He had  seen Dr. Myra Gianotti on 06/15/2009, and I am seeing for further discussion  of planned femoral to femoral bypass.   PHYSICAL EXAMINATION:  Well-developed, well-nourished white male  appearing stated age.  Blood pressure is 148/83, pulse 89, respirations  17.  General:  He is in no acute distress.  He does have a well-healed  midline abdominal incision with no evidence of hernia.  His femoral  pulses are 2+ on the left and absent on the right.  His feet are well-  perfused.  He has no skin ulcers or rashes, and he has no focal weakness  or paresthesias from a neurologic standpoint.   I again reviewed his films with him.  He does have a patent profunda and  superficial femoral and three-vessel runoff bilaterally.  I have  recommended that we proceed with a left-to-right fem-fem bypass for  limiting claudication.  He understands the procedure including the  potential for occlusion of the graft and also the potential for bleeding  or infection which would be quite unusual.  He wishes to proceed.  We  will schedule this at his convenience on 06/29/2009.     Larina Earthly, M.D.  Electronically Signed   TFE/MEDQ  D:  06/19/2009  T:  06/22/2009  Job:  [id number]   cc:   Everardo Beals. Juanda Chance, MD, [facility]  Verne Carrow, MD  Marikay Alar, M.D.  Delano Metz, M.D.

## 2010-08-31 NOTE — Procedures (Signed)
CAROTID DUPLEX EXAM   INDICATION:  Preop for bypass surgery.   HISTORY:  Diabetes:  Yes.  Cardiac:  Yes.  Hypertension:  Yes.  Smoking:  Yes.  Previous Surgery:  CV History:  Amaurosis Fugax No, Paresthesias No, Hemiparesis No.                                       RIGHT             LEFT  Brachial systolic pressure:         124  Brachial Doppler waveforms:  Vertebral direction of flow:        Antegrade         Antegrade  DUPLEX VELOCITIES (cm/sec)  CCA peak systolic                   107               110  ECA peak systolic                   146               120  ICA peak systolic                   120               126  ICA end diastolic                   23                49  PLAQUE MORPHOLOGY:                  Heterogenous      Heterogenous  PLAQUE AMOUNT:                      Mild              Mild  PLAQUE LOCATION:                    ICA, ECA          ICA, ECA   IMPRESSION:  1. 40-59% stenosis noted in bilateral internal carotid arteries.  2. Antegrade bilateral vertebral arteries.    ___________________________________________  V. Charlena Cross, MD   MG/MEDQ  D:  06/15/2009  T:  06/16/2009  Job:  [id number]

## 2010-08-31 NOTE — Assessment & Plan Note (Signed)
OFFICE VISIT   TERRAL, COOKS  DOB:  09/21/1945                                       08/14/2009  EAVWU#:[id number]   Patient presents today for follow-up of his left-to-right fem-fem bypass  with a 10-mm Hemashield graft on 04/042011.  He is approximately 15  years status post aortobifemoral bypass for occlusive disease and had an  occlusion of his right limb.  He did extremely well in the hospitalist  and has had no difficulty.   His incisions are healing quite well with the subcuticular closure.  He  has 2+ femoral pulses with a palpable fem-fem graft as well.  He has  palpable posterior tibial pulses bilaterally.  Ankle-arm index is 0.99  on the right and 0.82 on the left.   He will continue his walking program and continue to follow up with [place]  on an as-needed basis.     Larina Earthly, M.D.  Electronically Signed   TFE/MEDQ  D:  08/14/2009  T:  08/18/2009  Job:  [id number]   cc:   Everardo Beals. Juanda Chance, MD, [facility]  Verne Carrow, MD  Tia Alert, MD

## 2010-09-03 NOTE — Discharge Summary (Signed)
[name], [name]                 ACCOUNT NO.:  [id number]   MEDICAL RECORD NO.:  [id number]          PATIENT TYPE:  INP   LOCATION:  2040                         FACILITY:  [facility]   PHYSICIAN:  Theodore Demark, P.A. LHCDATE OF BIRTH:  1945/07/13   DATE OF ADMISSION:  01/06/2005  DATE OF DISCHARGE:  01/09/2005                                 DISCHARGE SUMMARY   PROCEDURES:  1.  Cardiac catheterization.  2.  Coronary arteriogram.  3.  Left ventriculogram.  4.  Attempted percutaneous intervention.  5.  Intolerance to statins, including Lipitor and Zocor, and intolerance to      Altace with cough.   DISCHARGE DIAGNOSES:  1.  Inferior myocardial infarction at the right coronary artery, totaled at      catheterization, unable to cross.  2.  Residual coronary artery disease with 70% in-stent restenosis in the      left anterior descending artery, evaluate with stress test once recovers      from current myocardial infarction.  3.  Left ventricular dysfunction with an ejection fraction of 30% and      inferior akinesis.  4.  Ischemic cardiomyopathy.  5.  Hyperlipidemia.  6.  Noninsulin-dependent diabetes mellitus.  7.  Hypertension.  8.  Peripheral vascular disease, status post femoral-popliteal bypass.  9.  Tobacco use, quitting on Wellbutrin.  10. Status post appendectomy, left shoulder surgery and femoral-popliteal      bypass.  11. Gastroesophageal reflux disease.   HOSPITAL COURSE:  [name] is a 65 year old male with known coronary artery  disease.  He had an MI in 1991 and an MI in August 2006 with PTCA and stent  to the RCA.  He also had percutaneous interventions in 1992, 1995 and 1996.  He had chest pain on the day of admission and came to the hospital, where he  was admitted for further evaluation and treatment.   His cardiac enzymes were elevated, indicating an MI.  He was taken to the  catheterization lab, where his RCA was totaled and they were unable to cross  it  and do percutaneous intervention.  Dr. Juanda Chance felt that the LAD in-stent  restenosis of 70% should be studied and evaluated later once he recovers  from this MI.   By January 09, 2005, [name] was ambulating without chest pain or  shortness of breath.  He had his metoprolol changed to bisoprolol for  ischemic cardiomyopathy and Pravachol added to his medication regimen as  well.  [name] was evaluated by Dr. Antoine Poche and considered stable for  discharge on January 09, 2005, with outpatient follow-up arranged.   DISCHARGE INSTRUCTIONS:  1.  His activity level is to be increased slowly.  He is not to drive for      three days and not to lift for three weeks.  2.  He is to call our office for problems with the catheterization site.  3.  He is to follow up with Dr. Regino Schultze P.A. on October 3 at 11 a.m.   DISCHARGE MEDICATIONS:  1.  Plavix 75 mg daily.  2.  Coated aspirin 325 mg daily.  3.  Nitroglycerin sublingual p.r.n..  4.  Cozaar 25 mg daily.  5.  Metoprolol is discontinued.  6.  Zetia 10 mg daily.  7.  Allegra 180 mg daily.  8.  Wellbutrin SR 150 mg b.i.d.  9.  Bisoprolol 5 mg daily.  10. Pravachol 20 mg daily.      Theodore Demark, P.A. [facility]     RB/MEDQ  D:  01/09/2005  T:  01/10/2005  Job:  [id number]   cc:   Charlies Constable, M.D. [facility]  [address]  [address]  [place]  [place] [address]

## 2010-09-03 NOTE — Cardiovascular Report (Signed)
[name], [name]:  [id number]   MEDICAL RECORD NO.:  [id number]          PATIENT TYPE:  INP   LOCATION:  2040                         FACILITY:  [facility]   PHYSICIAN:  Vida Roller, M.D.   DATE OF BIRTH:  03-Oct-1945   DATE OF PROCEDURE:  01/07/2005  DATE OF DISCHARGE:  01/09/2005                              CARDIAC CATHETERIZATION   HISTORY OF PRESENT ILLNESS:  [name] is a 65 year old man who has multiple  stents in his coronary arteries, status post multiple percutaneous re-  vascularizations. He had a diaphragmatic myocardial infarction in August of  2006 and had a Taxus stent placed in his right coronary artery. He has also  had a stent placed in his proximal left anterior descending coronary artery  several years ago, which we think is a bare metal stent. He presents now  with acute chest discomfort and has what appears to be a non-ST segment  elevation myocardial infarction.   PROCEDURE:  1.  Left heart catheterization.  2.  Selective coronary angiography.  3.  Left ventriculography.   DESCRIPTION OF PROCEDURE:  After obtaining informed consent, the patient was  brought to the cardiac catheterization laboratory in the fasting state.  There he was prepped and draped in the usual sterile manner. A local  anesthetic was obtained over the right groin using 1% Lidocaine with  epinephrine. The right femoral artery was cannulated using the modified  Seldinger technique with a 6 French 10 cm sheath and the left heart  catheterization was performed using a 6 French Judkins left #4, a 6 French  Judkins right #4, and a 6 French pigtail catheter. The left ventriculogram  was performed in the 30 degree RAO view. At the conclusion of the procedure  the catheter was sewn in place for percutaneous revascularization.   RESULTS:  Aorta:  121/67 with a mean of 87 mmHg.   Left ventriculogram:  126/17 with an end-diastolic pressure of 29 mmHg.   Coronary  angiography:  The left main coronary artery is a moderate caliber  vessel, which is angiographically unremarkable.   The left anterior descending coronary artery is a large trans-apical vessel  with a stent in its mid portion. There is 70%  In-Stent restenosis.   The circumflex coronary artery is a moderate caliber vessel with two large  obtuse marginal's and is angiographically unremarkable.   The right coronary artery is a moderate caliber vessel, which is occluded in  its mid portion at the edge of the stent. There is no distal vessel seen.   Left ventriculogram reveals depressed left ventricular systolic function  with an ejection fraction of about 30% with inferior inferobasilar,  inferolateral severe akinesis. No mitral regurgitation is seen.   ASSESSMENT:  1.  Severe 2 vessel coronary disease with a non-ST segment elevation      myocardial infarction.  2.  Depressed left ventricular systolic function.   RECOMMENDATIONS:  After reviewing these films with Dr. Juanda Chance in detail, he  recommended that we consider percutaneous re-vascularization of the right  coronary artery and further evaluation as per  his details remarks.      Vida Roller, M.D.  Electronically Signed     JH/MEDQ  D:  01/07/2005  T:  01/09/2005  Job:  [id number]   cc:   Heide Guile, MD  Fax: [phone]   Charlies Constable, M.D. [facility]  [address]  [address]  [place]  [place] [address]

## 2010-09-03 NOTE — H&P (Signed)
[name], [name]                 ACCOUNT NO.:  [id number]   MEDICAL RECORD NO.:  [id number]          PATIENT TYPE:  EMS   LOCATION:  MAJO                         FACILITY:  [facility]   PHYSICIAN:  Thomas C. Wall, M.D.   DATE OF BIRTH:  08/09/1945   DATE OF ADMISSION:  03/03/2004  DATE OF DISCHARGE:                                HISTORY & PHYSICAL   PRIMARY CARDIOLOGIST:  Charlies Constable, M.D. [facility].   PRIMARY CARE PHYSICIAN:  Dr. Maisie Fus in [place].   CHIEF COMPLAINT:  Chest pain.   HISTORY OF PRESENT ILLNESS:  Onset 2-3 weeks ago, got worse yesterday while  climbing a ladder.  Describes chest pain as tightness, sharp at left breast,  positive for shortness of breath, denies dizziness or lightheadedness,  denies nausea, states pain is non-radiating.  The patient states it did ease  off with rest.  He states the discomfort is intermittent.  It increases with  activity.  Over the last two weeks, he has also noticed increased  palpitations, decreased energy, increased fatigue.  He currently denies any  chest pain.   ALLERGIES:  The patient is unable to tolerate STATINS.   MEDICATIONS:  1.  Lopressor 50 b.i.d.  2.  Cozaar 25 every day.  3.  Aspirin 325.  4.  Vytorin 10/40.  5.  Saw palmetto 1150.  6.  Garlic 1,000.   PAST MEDICAL HISTORY:  1.  Prior cath in 2000.  2.  Status post CABG, last LVE at 37% by Cardiolite in 2004.  3.  Hyperlipidemia.  4.  Bilateral fem-pop bypass grafting in 1992.  5.  Peripheral vascular disease.  6.  Coronary artery disease.  7.  Diaphragmatic anterior MI, in 1992, treated with a PCI.  8.  MI, in 2000, with a stent to the LAD and PTCA to the diagonal.  9.  Ischemic cardiomyopathy.  10. Status post appendectomy.   SOCIAL HISTORY:  [place].  He  is a truck Academic librarian.  He continues to smoke one pack or cigars a day.  ETOH beer socially occasionally.  Denies any recreational herbal medications  other than Saw palmetto and garlic.   DIET:  Whatever he wants, although he does try to follow a low salt diet.   No exercise outside of work.   FAMILY HISTORY:  Mother alive at 31 with known heart disease.  Father  deceased at age 11 with heart disease and diabetes.  He has a sister that is  alive with heart disease and diabetes.   REVIEW OF SYSTEMS:  Negative for fever, chills, sweats, or weight change.  HEENT:  Denies.  SKIN:  Denies.  CARDIOPULMONARY:  Positive for chest pain  as state above, palpitations, occasional wheezing.  GU:  Denies.  NEURO/PSYCH:  Denies.  MS:  Complains of muscle aches, shoulder and legs.  GI:  Denies any nausea, vomiting, diarrhea.  Complains of occasional  indigestion.   PHYSICAL EXAMINATION:  VITAL SIGNS:  Temperature 96.7, pulse 84,  respirations 22, BP 132/75, 96% on room air.  GENERAL:  No acute distress.  Alert and oriented.  Slightly obese.  Pleasant  Caucasian male.  HEENT:  Pupils equal, round, reactive.  Sclerae are clear.  NECK:  Supple without lymphadenopathy.  Negative for TMJ, bruit, or JVD.  CVS:  Heart regular rate and rhythm.  S1 S2.  Pulses 2+ and equal.  LUNGS:  Clear to auscultation bilateral.  SKIN:  Other than scar to abdomen.  ABDOMEN:  Soft nontender.  Positive bowel sounds.  EXTREMITIES:  No clubbing, cyanosis, or edema.  No joint deformity or  effusions.  No spine or CVA tenderness.  NEURO:  Alert and oriented x 3.  Cranial nerves II-XII grossly intact.  Strength 5/5 all extremities.   Chest x-ray:  No acute distress.   EKG:  Rate of 76, normal sinus rhythm, PR 128, QRS 100, QTC 434.  T wave  inversion noted in lead III.   WBC 6.4, hemoglobin 15.8, hematocrit 45, platelets 212.  Sodium 138,  potassium 4.4, BUN 15, creatinine 1.1, glucose 182.  AST 24, ALT 29.  CK-MB  2.0, troponin 0.01, index 2.0, CK total 100.  PT 33, PT 12.6, INR 0.9.   Dr. Daleen Squibb into to evaluate patient and examine.   IMPRESSION:  1.  Chest pain  with negative enzymes.  2.  History of coronary artery disease with interventions.  3.  Ischemic cardiomyopathy with an ejection fraction of 35.  4.  Multiple cardiac risk factors.  5.  Elevated glucose.   PLAN:  1.  Admit the patient to a tele bed.  2.  Cycle his cardiac enzymes.  3.  Check lipids.  4.  Check a hemoglobin A1c.  5.  Plan for a cath in the morning.  6.  Nitroglycerin drip as needed for chest pain.  7.  Heparin drip unless cardiac research feels another drip is necessary.  8.  Probably repeat echocardiogram this admission.  9.  We will continue Cozaar, Lopressor, aspirin.  10. We will hold the Vytorin for now.      [place]   MB/MEDQ  D:  03/03/2004  T:  03/03/2004  Job:  [id number]

## 2010-09-03 NOTE — Discharge Summary (Signed)
NAMEALEXES, [name]                 ACCOUNT NO.:  [id number]   MEDICAL RECORD NO.:  [id number]          PATIENT TYPE:  INP   LOCATION:  3743                         FACILITY:  [facility]   PHYSICIAN:  Charlies Constable, M.D. [facility] DATE OF BIRTH:  [birth year]   DATE OF ADMISSION:  12/06/2004  DATE OF DISCHARGE:  12/10/2004                                 DISCHARGE SUMMARY   PROCEDURE:  1.  Cardiac catheterization.  2.  Coronary arteriogram.  3.  Left ventriculogram.  4.  PTCA and TAXI stent to one vessel.   DISCHARGE DIAGNOSES:  1.  Acute inferior myocardial infarction with TAXI stent to the right      coronary artery.  2.  Residual coronary artery disease of 60-70% and a previously placed stent      in the left anterior descending.  3.  Left ventricular dysfunction with an ejection fraction of 35-40% at      cath.  4.  Hyperlipidemia.  5.  Peripheral vascular disease status post male-pop.  6.  Status post appendectomy.  7.  History of tobacco use, not currently smoking.  8.  Hypertension.  9.  Diabetes, new diagnosis.  10. Status post left shoulder surgery.   HOSPITAL COURSE:  [name] is a 65 year old male with known coronary artery  disease. He had onset of substernal chest pain that started at rest on the  day of admission went to [facility]. He had ST elevation and he was  transferred to [facility] for an acute MI. He had a TAXI stent to  the RCA reducing the stenosis from 100% to 0. He has InStent restenosis of  60-70% in the previously placed LAD stent. His EF is 35-40%.   [name] had substernal chest pain post MI. His EKG showed evolving MI  changes but no ST elevation. His cardiac enzymes were trending down. He was  treated medically and his symptoms improved.   [name] was noted to be hypoglycemic with a blood sugar of 146. Hemoglobin  A1c was checked and was elevated at 7.1%. He was seen by the diabetes  nutritionist and given information on a diabetic  diet. He is to followup  with outpatient diabetes education and with his family physician. No  medication is added for this at this time.   A lipid profile was also performed and his total cholesterol was 156 with  triglycerides of 153, HDL 34, LDL 91. He has had problems with tolerating  statins in the past including Lipitor and Zocor. He was placed on Zetia  prior to admission and had recently started this. He is to continue this and  get a followup lipid profile in 3 months.   Prior to discharge, [name] was noted to upper respiratory congestion and  a productive cough. He had a low grade temperature as well at 100.6. He was  given a Z-Pak and an antihistamine. If the symptoms do not improve, he is to  followup with his primary care physician.   He was ambulating without chest pain or shortness of breath. [name]  had a  occasional episodes of 2:1 heart block. His beta blocker had been increased  from 50 mg of metoprolol daily to 75 mg b.i.d.  He is to continue this dose  at this time. Although he had some brief episodes of 2:1 heart block, his  underlying heart rate did not drop below 50 sustained. He was asymptomatic.  He is to continue taking the beta blocker at the increased dose and followup  as an outpatient.   On December 10, 2004, [name] was ambulating without chest pain or shortness  of breath. He was evaluated by Dr. Juanda Chance and considered stable for  discharge with outpatient followup arranged.   DISCHARGE INSTRUCTIONS:  1.  His activity level is to be per cardiac rehab. He has been referred for      outpatient cardiac rehab. He is to stick to a low fat diabetic diet and      followup with outpatient diabetes education as well.  2.  Care for is cath site is to be per the cath site instruction sheet. He      is to followup with Dr. Regino Schultze PA on September 12 at 11 a.m. and with      Dr. Robb Matar within two weeks.   DISCHARGE MEDICATIONS:  1.  Cozaar 25 mg  daily.  2.  Metoprolol 50 mg 1 1/2 tabs b.i.d.  3.  Zyban as prior to admission.  4.  Aspirin 325 mg daily.  5.  Plavix 75 mg daily.  6.  Garlic and Saw Palmetto as prior to admission.  7.  Zetia 10 mg daily.  8.  Nitroglycerin sublingual p.r.n.  9.  Allegra 180 mg daily for one week.  10. Z-Pak.  11. Glucometer and b.i.d. CBGs, record for Dr. Maisie Fus.      Theodore Demark, P.A. [facility]    ______________________________  Charlies Constable, M.D. [facility]    RB/MEDQ  D:  12/10/2004  T:  12/10/2004  Job:  [id number]   cc:   Robb Matar, M.D.  [facility]

## 2010-09-03 NOTE — Discharge Summary (Signed)
[name], [name]                 ACCOUNT NO.:  [id number]   MEDICAL RECORD NO.:  [id number]          PATIENT TYPE:  INP   LOCATION:  3702                         FACILITY:  [facility]   PHYSICIAN:  Jesse Sans. Wall, M.D.   DATE OF BIRTH:  09-12-1945   DATE OF ADMISSION:  03/03/2004  DATE OF DISCHARGE:  03/05/2004                                 DISCHARGE SUMMARY   PROCEDURES:  1.  Cardiac catheterization.  2.  Coronary arteriogram.  3.  Left ventriculogram.  4.  CT of the chest.   HOSPITAL COURSE:  [name] is a 65 year old male with a history of coronary  artery disease.  He is status post interventions to the LAD and RCA with the  last one in 2000.  He had a Cardiolite in 2004 at which time his EF was 30%  with no ischemia.  He has continued to smoke.   [name] came to the hospital for a 2-3 week history of intermittent chest  pain that was associated with exertion.  It got worse when he was climbing a  ladder and was relieved with rest.  He also noted some increased fatigue.  He was admitted for further evaluation and treatment.   His enzymes were negative for MI and a cardiac catheterization was performed  on March 04, 2004.  He had less than 20% restenosis in the LAD stent.  There was also less than 20% restenosis of the RCA.  He had inferior wall  motion and his EF was estimated at 45%.  Dr. Riley Kill evaluated the films and  felt that he had an ischemic cardiomyopathy but his EF was unchanged and  there were no critical lesions to account for his pain.  A D-dimer was  ordered and was elevated at 1.01 so a chest CT was ordered as well.  The  chest CT showed no pulmonary embolus.  There were no acute findings.  The  stent was noted to be in the LAD.   [name] has an extensive history of tobacco use and a smoking cessation  consult was called.  He seems fairly motivated to quit and his girlfriend  states that she will quit as well.  In addition, his blood sugars were  noted  to be elevated with the highest one 182.  A fasting blood sugar was 122 but  a hemoglobin A1C was within normal limits at 6.  The patient was counseled  regarding this and advised that he was at high risk to develop diabetes in  the future.  He was advised to watch his sugars and carbohydrates in his  diet and follow up closely with Dr. Maisie Fus for preventative strategies.   By March 05, 2004, [name] was ambulating without chest pain or  shortness of breath.  He was considered stable for discharge with outpatient  follow up arranged.   LABORATORY DATA:  Hemoglobin 15.2, hematocrit 43.7, WBC 7.3, platelets 217.  INR 0.9.  Sodium 141, potassium 4.5, chloride 109, CO2 27, BUN 11,  creatinine 1, glucose 122, calcium 9.1.  Hemoglobin A1C 6.  Total  cholesterol 136, triglycerides 317, HDL 26, LDL 47, TSH 2.814.   DISCHARGE CONDITION:  Stable.   DISCHARGE DIAGNOSES:  1.  Chest pain--no critical coronary artery disease by cath and no pulmonary      embolus by CT.  There is concern for his chest pain being related to the      Vytorin which was held during his hospitalization.  A discussion was      held with the patient and it was decided that we should start the      Vytorin at a lower dose 10/20 mg.  He had been on this for a month and      had apparently tolerated it.  He is to follow up with cardiology and      with primary care.  2.  Hyperlipidemia.  3.  Status post fem-pop bypass graft in 1992.  4.  Status post myocardial infarction in 2000 treated with a percutaneous      transluminal coronary angioplasty and stent to the left anterior      descending as well as a diaphragmatic infarction in 1992 treated with      percutaneous intervention to the right coronary artery.  5.  Ongoing tobacco use.  6.  Neck and arm pain--possible cervical radiculopathy.  7.  Ischemic cardiomyopathy with an ejection fraction of 45% by cath this      admission.  8.  History of nocturia.  9.   History of skin cancer removal from his nose.  10. History of arthralgias in his fingers.  11. INTOLERANCE to STATINS with arthralgias.   DISCHARGE INSTRUCTIONS:  1.  His activity level is to include no driving or strenuous activity for 2      days and limited activity for a week.  2.  He is to stick to a diet that is low in fat, salt and sugar.  3.  He is to call the office for problems with the cath site.  4.  He is not to use tobacco.  5.  He is to call Dr. Maisie Fus for an appointment and follow with Dr. Juanda Chance      as well.   DISCHARGE MEDICATIONS:  1.  Lopressor 50 mg b.i.d.  2.  Cozaar 25 mg daily.  3.  Aspirin 325 mg daily.  4.  Vytorin 10/20 mg daily.  5.  Saw palmetto and garlic as prior to admission.      Rhon   RB/MEDQ  D:  03/05/2004  T:  03/06/2004  Job:  [id number]   cc:   Dr. Jani Files  [place]   Charlies Constable, M.D. [facility]

## 2010-09-03 NOTE — Assessment & Plan Note (Signed)
[facility]                              CARDIOLOGY OFFICE NOTE   NAME:COLEYObrien, Huskins                        MRN:          [id number]  DATE:02/10/2006                            DOB:          1946-03-26    PRIMARY CARE PHYSICIAN:  Elvera Bicker, [facility]   CLINICAL COURSE:  Mr. Morgenstern is 65 years old and had a Taxus stent placed to  the right coronary artery in 2006 for an acute documented myocardial  infarction and subsequently had a previous stent placed in the LAD.  In  September 2006 he had stent thrombosis and unable to cross the coronary  artery.  His initial ejection fraction was 30% but on subsequent echo  improved to 35% to 45%.   He says he has been doing very well.  He has had no chest pain, no shortness  of breath.  He does have some occasional palpations.  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   He has been seeing Elvera Bicker at [facility] and [facility] who  has been adjusting medicines for blood pressure.   PAST MEDICAL HISTORY:  Is significant for hyperlipidemia, previous tobacco  use and history of bilateral femoral popliteal bypass graft surgery.   CURRENT MEDICATIONS:  Cozaar, aspirin, Zetia, metoprolol, Pravachol, and  fish oil.   EXAMINATION:  The blood pressure was 146/96 and pulse 71 and regular.  There was no venous distention.  The carotid pulses were full without  bruits.  CHEST:  Was clear.  CARDIAC:  Rhythm was regular.  No murmurs or gallops.  ABDOMEN:  Was soft with normal bowel sounds.  The abdomen was protuberant.  There is no hepatosplenomegaly.  There was a midline abdominal scar.  EXTREMITIES:  There was no peripheral edema and had difficulty feeling pedal  pulses.   An ECG showed inferior T-wave changes.   IMPRESSION:  1. Coronary artery disease status post prior documented myocardial      infarction and prior PCIs of  coronary anatomy as described above.  2. Ejection fraction 35% to 45%.  3. Hyperlipidemia.  4. Peripheral vascular disease status post bilateral femoral popliteal      bypass graft surgery.  5. Hypertension.   RECOMMENDATIONS:  1. I think Mr. Mizzell is doing fairly well.  He needs to lose weight and do      more exercise and we talked about that.  His blood pressure is not      optimally controlled and we will adjust his beta blocker up a little      bit.  He says he is not getting the bisoprolol on the generic [employer]      list and so we will switch him to metoprolol 25 twice a day which is on      the list and he can get for 4 dollars a prescription.  I will leave his      other medicines the same.  2. I will plan to see him  back in followup in a year.    ______________________________  Everardo Beals. Juanda Chance, MD, [facility]    BRB/MedQ  DD: 02/10/2006  DT: 02/13/2006  Job #: [id number]

## 2010-09-03 NOTE — Cardiovascular Report (Signed)
NAMEZARIF, RATHJE NO.:  [id number]   MEDICAL RECORD NO.:  [id number]          PATIENT TYPE:  INP   LOCATION:  NA                           FACILITY:  [facility]   PHYSICIAN:  Charlies Constable, M.D. [facility] DATE OF BIRTH:  07-14-1945   DATE OF PROCEDURE:  12/07/2004  DATE OF DISCHARGE:                              CARDIAC CATHETERIZATION   CLINICAL HISTORY:  Mr. Umland is 65 years old and has known coronary artery  disease with previous diaphragmatic wall infarction treated with PTCA of the  right coronary artery and he subsequently has had a stent placed in the left  anterior descending artery.  He developed chest pain today and presented to  [facility] Emergency Room where the EKG showed an acute diaphragmatic  wall infarction.  He was seen by Dr. Gilford Raid Harsh and given reteplase and  transferred to [place] for intervention.   PROCEDURE:  The procedure was performed via the right femoral arteries and  arterial sheath and 6 French preformed coronary catheters.  The patient had  a previous femoral-popliteal bypass surgery bilaterally and we went high on  our stick.  The patient was having 7/10 chest pain when we began the  procedure.  After placing the diagnostic sheath, we made decision to do  intervention the right coronary artery.  We used a JR-4 6 [place] guiding  catheter with side holes and a Prowater wire.  We crossed the totally  occluded distal right coronary artery with the wire without too much  difficulty.  We dilated the lesion and we predilated with a 2.25 x 20 mm  Maverick performing two inflations up to 8 atmospheres for 30 seconds. This  reestablished flow.  We then employed a 2.5 x 20 mm TAXUS stent ending this  just before the posterior descending branch.  We deployed this with one  inflation of 15 atmospheres for 30 seconds.  The stent was post dilated with  a 2.75 x 15 mm Quantum Maverick.  We performed two inflations up to 16  atmospheres for 30  seconds.  There was a slight filling defect at the very  origin of the posterolateral branch after the posterior descending branch  and we treated this with a 2.75 x 15 mm Quantum Maverick performing one  inflation up to 8 atmospheres for 30 seconds.   The patient tolerated the procedure well and left the laboratory in  satisfactory condition.  We took __________ and elected not to close the  patient because of the reteplase and the previous femoral-popliteal bypass  surgery.   RESULTS:  The aortic pressure was 150/87 with a mean of 119.  Left  ventricular pressure was 150/28.   Left main coronary artery:  The left main coronary artery was free of  significant disease.   Left anterior descending coronary artery:  The left anterior descending  artery gave rise to a large diagonal branch with large septal perforator,  two small septal perforators and a second diagonal branch.  There was 60 to  70% narrowing within the stent in the proximal LAD.   Circumflex  artery:  The circumflex artery gave rise to a large ramus branch,  atrial branch, marginal branch and two posterolateral branches.  These  vessels were irregular but free of major obstruction.   Right coronary artery:  The right coronary artery is a large dominant vessel  that gave rise to a conus branch, right ventricular branch and then was  completely cleared in its distal portion.  There was 40% narrowing in its  mid portion.   Left ventriculogram:  The left ventriculogram performed in the RAO  projection, showed akinesis of the inferior wall all the way out and  including the tip of the apex.  The anterolateral wall moved fairly well.  The estimated ejection fraction was 35 to 40%.   Following PTCA and stenting  of the lesion in the distal right coronary  artery, the stenosis improved from 100% to 0% and the flow improved from  TIMI-0 to TIMI-3 flow.  There was no major distal disease.   CONCLUSION:  1.  Acute  diaphragmatic wall infarction with total occlusion of the distal      right coronary artery, no major obstruction of the circumflex artery, 60      to 70% narrowing in the proximal left anterior descending within the      stent and inferior wall akinesis with an estimated ejection fraction of      35 to 40%.  2.  Successful percutaneous coronary intervention of the lesion in the      distal right coronary artery using a TAXUS drug eluting stent and      improving in percent of narrowing from 100% to 0% and improvement in      flow from TIMI-0 to TIMI-3 flow.   The patient had the onset of chest pain at 9:40 and arrived at [facility] Emergency Room at 11:20.  He arrived to [facility] [employer] [facility] Cath Lab at 12:37 and balloon inflation established reperfusion at  1:21.  This gave a door balloon time from [facility] of two hours and  one minute and reperfusion time of three hours and 41 minutes.   CONCLUSION:  1.  Acute diaphragmatic wall infarction, treated with reteplase with total      occlusion of the distal right coronary artery, 60 to 70% stenosis in the      proximal left anterior descending, no major obstruction of the      circumflex artery and inferior wall akinesis with an estimated ejection      fraction of 35 to 40%.  2.  Successful rescue percutaneous coronary intervention of the lesion in      the distal right coronary artery using a TAXUS drug eluting stent with      improvement in percent of narrowing from 100% to 0% and improvement in      flow from TIMI-0 to TIMI-3 flow.   DISPOSITION:  Patient will return to post anesthesia care unit for further  observation.  I do not think the lesion in the left anterior descending is  tight enough to warrant intervention.  Will target discharge on day #2.  Will try and get the sheath out in about eight hours when the TPA has worn  off.           ______________________________ Charlies Constable, M.D.  [facility]     BB/MEDQ  D:  12/07/2004  T:  12/07/2004  Job:  [id number]   cc:   Heide Guile, MD  [address]  [address]  [place]  [place] [address]  Fax: [phone]   Cardiopulmonary Lab

## 2010-09-03 NOTE — Cardiovascular Report (Signed)
[name], [name]                 ACCOUNT NO.:  [id number]   MEDICAL RECORD NO.:  [id number]          PATIENT TYPE:  INP   LOCATION:  3702                         FACILITY:  [facility]   PHYSICIAN:  Arturo Morton. Riley Kill, M.D. [facility] OF BIRTH:  June 02, 1945   DATE OF PROCEDURE:  03/04/2004  DATE OF DISCHARGE:                              CARDIAC CATHETERIZATION   INDICATIONS:  [name] is a 65 year old gentleman who presents with some  recurrent chest pain.  He has a known history of coronary artery disease  with stenting of the left anterior descending artery in 2000 and prior  intervention of the right coronary artery.  They have been unable to find  his records at the present time, and we were in the process of getting  information.  By history, there is evidence of a myocardial infarction in  1992, with PTCA to the RCA, and an anterior wall infarction in 2000, with a  stent to the LAD.  A left ventricular ejection fraction in 2002 by gated  radionuclide imaging revealed an EF of 39%.  There was thought to be  inferior scar.  He was brought to the catheterization laboratory for further  evaluation.  In 2004, he had a stress Cardiolite study which also showed an  extensive prior inferior infarct from base to apex but no ischemia, and EF  of 37%.   The patient was enrolled in the ACUITY trial and brought to the  catheterization laboratory for further evaluation.   PROCEDURES:  1.  Left heart catheterization.  2.  Selective coronary arteriography.  3.  Selective left ventriculography.   DESCRIPTION OF PROCEDURE:  The patient was brought to the catheterization  laboratory and prepped and draped in the usual fashion.  Through an anterior  puncture, the right femoral graft was entered.  We stuck slightly high in  order to enter the graft, and there was no difficulty getting in.  Views of  the left and right coronary arteries were obtained in multiple angiographic  projections.  Central  aortic and left ventricular pressures were measured  with a pigtail, and ventriculography was performed in the RAO projection.  Overall, he tolerated the procedure well, and there were no complications.  He was taken to the holding area in satisfactory clinical condition.   HEMODYNAMIC DATA:  1.  Central aortic pressure 144/84, mean 109.  2.  Left ventricle 150/119.  3.  No gradient on pullback across the aortic valve.   ANGIOGRAPHIC DATA:  1.  Ventriculography was performed in the RAO projection.  There was an      extensive area of inferior wall severe hypo- to near akinesis.  This      extended from base to apex.  I estimated the ejection fraction at 45%.      There did not appear to be significant mitral regurgitation.  2.  The right coronary artery has been previously intervened on.  There is      about 20% proximal narrowing and then a fair amount of luminal      irregularity throughout the midvessel, but  no high-grade areas of focal      stenosis.  There is a posterior descending and posterolateral branch      which are free of critical disease.  3.  The left main is free of critical disease.  4.  The left anterior descending artery courses to the apex.  There is      probably about 10-20% of in-stent re-narrowing which is not clinically      significant at the stent.  The stent is just beyond the origin of the      diagonal and overlapping the origin of the septal perforator.  The      diagonal and the septal appear to be widely patent, as does the mid- and      distal LAD.  The LAD has mild luminal irregularities.  5.  There is a ramus intermedius that has probably about 30% proximal      narrowing but is difficult to lay out, even in multiple views.  6.  The circumflex has minimal luminal plaquing involving the first bend,      but no areas of significant focal stenosis.   CONCLUSIONS:  1.  Moderate reduction in left ventricular function, with an extensive      inferior  wall motion abnormality consistent with prior Cardiolite      imaging.  2.  Continued patency of the stent to the left anterior descending artery.  3.  Continued patency of the right coronary artery.  4.  Continued tobacco use, with increased use of acute coronary events based      on poor habits.   DISPOSITION:  The patient will be treated medically at the present time.  We  will make a strong attempt to get him to stop smoking.  This will be  important for his long-term survival.       TDS/MEDQ  D:  03/04/2004  T:  03/05/2004  Job:  [id number]   cc:   Arturo Morton. Riley Kill, M.D. [facility]   CV Laboratory   Brad Caralyn Guile, M.D. [facility]

## 2010-09-03 NOTE — H&P (Signed)
NAMEAZIEL, MORGAN NO.:  [id number]   MEDICAL RECORD NO.:  [id number]          PATIENT TYPE:  INP   LOCATION:  2902                         FACILITY:  [facility]   PHYSICIAN:  Pricilla Riffle, M.D.    DATE OF BIRTH:  [birth year]   DATE OF ADMISSION:  01/06/2005  DATE OF DISCHARGE:                                HISTORY & PHYSICAL   IDENTIFICATION:  Mr. Meininger is a 65 year old gentleman with known CAD,  history of MI 70.  Had PTCA 1992, 1995, 1996.  Recently admitted in August  on the 21st with an inferior wall MI.  Underwent PTCA stent to the RCA (100  to 0).  Left heart catheterization also showed a 60-70% in-stent restenosis  to the LAD.  LV EF was noted to be 35 to 40%.   The patient was discharged home.  He has noted some mild fatigue around the  noon hour, but otherwise getting along with activity slowly.  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  About  2:30, though, he developed epigastric and then chest burning.  Stopped his  work, got out of the truck, pain got worse.  Drove to his daughter's and he  was brought to the emergency room.  He said that this discomfort was not  like what he experienced in August with his inferior MI which was with  severe nausea.  It was, however, like the MI that he had in 1992.  He was  given nitroglycerin x3, morphine IV, and IV Lopressor (5 mg) currently with  minimal discomfort.  Pain is gone.   ALLERGIES:  None, though intolerant to STATINS.   CURRENT MEDICATIONS:  1.  Cozaar 25 daily.  2.  Metoprolol 75 b.i.d.  3.  Zyban.  4.  Aspirin 325 daily.  5.  Plavix 75 daily.  6.  Zetia 10 daily.  7.  Allegra 180 daily.  8.  Garlic.  9.  Saw palmetto.   PAST MEDICAL HISTORY:  1.  Coronary artery disease status post PTCA as noted above.  2.  Diabetes.  Hemoglobin A1c 7.1 on dietary control.  3.  Dyslipidemia.  Last lipids in August 2006 showed total cholesterol 156,  triglycerides 153, HDL 34, LDL 91.  Zetia started in July or August.  4.  Hypertension.  5.  Peripheral vascular disease status post fem-pop.  6.  History of tobacco use, currently quitting.   PAST SURGICAL HISTORY:  1.  Status post appendectomy.  2.  Status post left shoulder surgery.  3.  Status post fem-pop.   SOCIAL HISTORY:  Positive tobacco, quitting.  Occasional EtOH.   REVIEW OF SYSTEMS:  All systems reviewed.  Negative to the above problem  except as noted.   PHYSICAL EXAMINATION:  GENERAL:  The patient is in no distress.  VITAL SIGNS:  Blood pressure 131/77, pulse 70, temperature afebrile,  respiratory rate 20.  NECK:  JVP is normal.  No bruits.  LUNGS:  Clear to auscultation.  CARDIAC:  Regular rate and  rhythm.  S1, S2.  No S3, S4, or murmurs.  CHEST:  Nontender.  ABDOMEN:  No masses.  No hepatomegaly.  Minimal epigastric tenderness.  EXTREMITIES:  Good distal pulses.  No edema.   LABORATORIES:  Significant for BUN/creatinine of 18 and 1.4, potassium 4.4.  INR 1, PTT 27.9.  CK-MB and troponin are pending.   12-lead EKG today shows normal sinus rhythm, a rate of 72 beats per minute,  occasional PVC, T-wave inversion noted in leads 2, 3, F, slight sagging in  the ST segment and V6.  Note, this is without significant change from  August.   IMPRESSION:  Mr. Capell is a 65 year old gentleman with known artery disease,  recent PTCA stent to the right coronary artery.  Came in today with chest  pain similar to his first myocardial infarction in the early 1990s.   PLAN:  Treat with current regimen and adding Lovenox.  Possible Integrilin.  Plan for a cardiac catheterization to redefine anatomy given abrupt  presentation.  Continue on other medications.           ______________________________  Pricilla Riffle, M.D.     PVR/MEDQ  D:  01/06/2005  T:  01/07/2005  Job:  [id number]

## 2010-09-03 NOTE — Cardiovascular Report (Signed)
[name], [name]                 ACCOUNT NO.:  [id number]   MEDICAL RECORD NO.:  [id number]          PATIENT TYPE:  INP   LOCATION:  2040                         FACILITY:  [facility]   PHYSICIAN:  Charlies Constable, M.D. [facility] DATE OF BIRTH:  03-20-1946   DATE OF PROCEDURE:  01/07/2005  DATE OF DISCHARGE:  01/09/2005                              CARDIAC CATHETERIZATION   PROCEDURE PERFORMED:  Percutaneous coronary intervention.   CARDIOLOGIST:  Charlies Constable, M.D.   CLINICAL HISTORY:  [name] is 65 years old and has had a remote documented  myocardial infarction and had an acute recurrent documented myocardial  infarction on December 06, 2004 treated with a TAXUS stent to the distal right  coronary artery.  He did well until yesterday when he was admitted to the  hod with recurrent chest pain and positive enzymes consistent with a non-ST  elevation infarction.  He did have some recurrent pain today and was cathed  by Dr. Dorethea Clan who found a totally occluded right coronary artery due to  stent thrombosis.  He had residual 70% narrowing within the stent in the  LAD.  After some discussion we made a decision to proceed with intervention  on his native right coronary artery.   DESCRIPTION OF PROCEDURE:  The procedure was performed via the right femoral  artery using an arterial sheath and 6 [place] JR-4 guiding catheter with side  holes.  The patient was given Angiomax bolus and infusion, and was also  given Integrilin double bolus and infusion.  He was also given 300 mg of  extra Plavix.   We attempted to cross the right coronary artery with multiple wires.  We  started with a ProWater wire and then switched to a PT-2 wire.  We were able  to advance the wire with a great deal of difficulty down to the distal  stent.  In navigating through the stent we were not sure if we were within  the stent or outside the struts.  We were able to advance a PT-2 wire and  subsequently a Whisper wire and a  Miracle [employer] 4.5 wire into what had  appeared to be distal posterolateral and distal, and posterior descending  branch.  However, we were unable to advance a 1.5  Voyager balloon through  the stent in the distal RCA.  We suspected we were underneath some struts  and we made multiple attempts to reroute the wire, but were never able to  pass the balloon through the stent.  Finally, after working of a long time  we decided to stop.   The patient did tolerate the procedure well and left the laboratory in  satisfactory condition.   RESULTS:  Initially the artery was totally occluded at its mid portion.  A  final picture did show a trickle of flow down through the stent into the  posterior descending and posterolateral branch, which was TIMI I flow, but  we never saw a clear channel through the stent and the channel many have  been around the stent.   CONCLUSION:  1.  Acute stent  thrombosis of a stent in the distal right coronary artery      associated with a non-ST elevated myocardial infarction.  2.  Unsuccessful attempt at percutaneous coronary intervention of the lesion      in the mid and distal right coronary artery due to inability to cross      with the balloon.   DISPOSITION:  The patient was returned to the post angioplasty unit for  further observation.  We will plan a functional study to evaluate the  significance of the LAD lesion later, and plan initial medical therapy.  The  inferior wall probably does not have much viable myocardium remaining after  a third infarct in this area.           ______________________________  Charlies Constable, M.D. [facility]     BB/MEDQ  D:  01/07/2005  T:  01/10/2005  Job:  [id number]   cc:   Cardiopulmonary Laboratory

## 2010-09-03 NOTE — H&P (Signed)
[name], [name]                 ACCOUNT NO.:  [id number]   MEDICAL RECORD NO.:  [id number]          PATIENT TYPE:  INP   LOCATION:  2930                         FACILITY:  [facility]   PHYSICIAN:  Rollene Rotunda, M.D.   DATE OF BIRTH:  09-Aug-1945   DATE OF ADMISSION:  12/07/2004  DATE OF DISCHARGE:                                HISTORY & PHYSICAL   PRIMARY CARE PHYSICIAN:  Unclear.   CARDIOLOGIST:  Charlies Constable, M.D.   REASON FOR PRESENTATION:  Evaluate patient with sudden onset chest pain.   HISTORY OF PRESENT ILLNESS:  The patient is a 65 year old gentleman with  prior cardiac history including myocardial infarction in 1992, stenting of  the LAD, and PTCA of the diagonal in the past with resultant mildly to  moderately reduced ejection fraction. His last catheterization was in  November 2005. At that time, he was noted to have 20% restenosis in the LAD  stent, 20% restenosis of the RCA. His EF was 45%.   The patient was at home at rest when he developed sudden onset of chest pain  at 9:40. He presented to [facility] at 2320. There, he was noted to  have inferior ST segment elevation and posterior elevation. He was managed  with heparin, aspirin, Plavix. Dr. Phoebe Perch saw him, and he was treated with  Retavase. He did not have clinical resolution and was transported directly  to our lab. Here, he was having 7/10 chest pain, substernal, similar to  previous. He continues to have unchanged ST segment elevation. He is  somewhat nauseated. There has been no vomiting. He is not short of breath.   The patient denies any recent symptoms. He is active, drives a truck. He  continues to smoke cigarettes.   PAST MEDICAL HISTORY:  1.  Coronary artery disease as described.  2.  Peripheral vascular disease.  3.  Ongoing tobacco abuse.  4.  Dyslipidemia.   PAST SURGICAL HISTORY:  1.  Appendectomy.  2.  FEM/POP bilateral bypass.   ALLERGIES:  The patient has been intolerant of  statins.   MEDICATIONS:  1.  Cozaar 25 mg a day.  2.  Metoprolol 50 mg a day.  3.  Zyban.  4.  Aspirin.   SOCIAL HISTORY:  The patient lives with his girlfriend. He smokes at least a  pack of cigarettes a day. 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   FAMILY HISTORY:  Contributory for first degree relatives with later onset  heart disease.   REVIEW OF SYSTEMS:  As stated in the HPI. Negative for other symptoms.   PHYSICAL EXAMINATION:  GENERAL:  The patient is acutely in some distress  secondary to chest pain.  VITAL SIGNS:  Blood pressure 150/87, heart rate 120 and regular.  HEENT:  Eyelids unremarkable. Pupils are equal, round, and reactive to  light. Fundi not visualized.  NECK:  The patient is lying flat, unable to assess jugular venous  distention, waveform within normal limits, no carotid bruits, brisk carotid  upstroke, no thyromegaly.  LYMPHATICS:  No cervical, axillary, or inguinal adenopathy.  LUNGS:  Clear to auscultation with decreased breath sounds with no wheezing  or crackles.  BACK:  No costovertebral angle tenderness.  CHEST:  Unremarkable.  HEART:  PMI not displaced or sustained. S1 and S2 within normal limits. No  S3, no S4, no murmurs.  ABDOMEN:  Flat, positive bowel sounds, normal in frequency and pitch, no  bruits, no rebound, no guarding, ___________ hepatomegaly, splenomegaly.  SKIN:  No rashes. No nausea.  EXTREMITIES:  2+ pulses, no edema, no cyanosis or clubbing.  NEUROLOGICAL:  Oriented to person, place, and time. Cranial nerves II-XII  grossly intact. Motor grossly intact.   LABORATORY DATA:  Sodium 142, potassium 4.0, BUN 13, creatinine 1.1, glucose  153. WBC 11.4, hemoglobin 17.1, platelets 274. INR 1.0.   ASSESSMENT:  1.  Acute inferior MI.  The patient has not reperfused clinically. He is      urgently brought to the catheterization lab where Dr. Juanda Chance will      perform an intervention.  2.  Elevated blood sugar. The patient had  an elevated blood sugar although      this is not fasting. He has had elevated blood sugars in the past. He      will need to follow, and he may need treatment for this.  3.  Dyslipidemia. He has not tolerated statins. Will check a lipid profile.      Perhaps he would tolerate Zetia, ascorbic acid, or niacin as indicated.  4.  Hypertension. He will continue on his medications as listed though will      be careful with his beta blocker as he is currently having Wenckebach.  5.  Tobacco. Will get a stop smoking consult again, and hopefully, he can      comply.  6.  Peripheral vascular disease. We will continue to suggest risk reduction.           ______________________________  Rollene Rotunda, M.D.     JH/MEDQ  D:  12/07/2004  T:  12/07/2004  Job:  [id number]

## 2010-09-20 ENCOUNTER — Other Ambulatory Visit: Payer: Self-pay | Admitting: *Deleted

## 2010-09-20 MED ORDER — LOSARTAN POTASSIUM 50 MG PO TABS: 50.0000 mg | ORAL_TABLET | Freq: Every day | ORAL | Status: DC

## 2010-10-05 ENCOUNTER — Telehealth: Payer: Self-pay | Admitting: Cardiovascular Disease

## 2010-10-05 ENCOUNTER — Other Ambulatory Visit: Payer: Self-pay | Admitting: *Deleted

## 2010-10-05 NOTE — Telephone Encounter (Signed)
Walk In pt Form" Pt Needs Prescription called In [facility]" sent to Message Nurse  10/05/10/km

## 2010-10-21 ENCOUNTER — Other Ambulatory Visit: Payer: Self-pay | Admitting: *Deleted

## 2010-10-21 MED ORDER — EZETIMIBE 10 MG PO TABS: 10.0000 mg | ORAL_TABLET | Freq: Every day | ORAL | Status: DC

## 2010-11-01 ENCOUNTER — Ambulatory Visit
Admission: RE | Admit: 2010-11-01 | Discharge: 2010-11-01 | Disposition: A | Discharge status: Home / Self Care | Payer: Worker's Compensation | Source: Ambulatory Visit | Attending: Neurological Surgery | Admitting: Neurological Surgery

## 2010-11-01 ENCOUNTER — Other Ambulatory Visit: Payer: Self-pay | Admitting: Neurological Surgery

## 2010-11-01 DIAGNOSIS — M79609 Pain in unspecified limb: Secondary | ICD-10-CM

## 2010-11-01 DIAGNOSIS — M5137 Other intervertebral disc degeneration, lumbosacral region: Secondary | ICD-10-CM

## 2010-11-01 DIAGNOSIS — M5126 Other intervertebral disc displacement, lumbar region: Secondary | ICD-10-CM

## 2011-02-07 ENCOUNTER — Other Ambulatory Visit: Payer: Self-pay

## 2011-02-08 MED ORDER — METOPROLOL TARTRATE 50 MG PO TABS: 50.0000 mg | ORAL_TABLET | Freq: Two times a day (BID) | ORAL | Status: DC

## 2011-02-08 NOTE — Telephone Encounter (Signed)
.   Requested Prescriptions   Signed Prescriptions Disp Refills  . metoprolol (LOPRESSOR) 50 MG tablet 60 tablet 11    Sig: Take 1 tablet (50 mg total) by mouth 2 (two) times daily.    Authorizing Provider: Verne Carrow    Ordering User: Lacie Scotts   E-scribe to [facility].

## 2011-02-22 ENCOUNTER — Other Ambulatory Visit: Payer: Self-pay | Admitting: Cardiovascular Disease

## 2011-02-22 MED ORDER — PRAVASTATIN SODIUM 20 MG PO TABS: 20.0000 mg | ORAL_TABLET | Freq: Every day | ORAL | Status: DC

## 2011-02-28 ENCOUNTER — Other Ambulatory Visit: Payer: Self-pay | Admitting: Neurological Surgery

## 2011-02-28 ENCOUNTER — Ambulatory Visit
Admission: RE | Admit: 2011-02-28 | Discharge: 2011-02-28 | Disposition: A | Discharge status: Home / Self Care | Payer: Worker's Compensation | Source: Ambulatory Visit | Attending: Neurological Surgery | Admitting: Neurological Surgery

## 2011-02-28 DIAGNOSIS — M79609 Pain in unspecified limb: Secondary | ICD-10-CM

## 2011-02-28 DIAGNOSIS — M5126 Other intervertebral disc displacement, lumbar region: Secondary | ICD-10-CM

## 2011-05-17 ENCOUNTER — Encounter: Payer: Self-pay | Admitting: Cardiovascular Disease

## 2011-05-17 ENCOUNTER — Ambulatory Visit (INDEPENDENT_AMBULATORY_CARE_PROVIDER_SITE_OTHER): Payer: Medicare Other | Admitting: Cardiovascular Disease

## 2011-05-17 VITALS — BP 134/75 | HR 64 | Ht 67.0 in | Wt 198.0 lb

## 2011-05-17 DIAGNOSIS — I2589 Other forms of chronic ischemic heart disease: Secondary | ICD-10-CM

## 2011-05-17 DIAGNOSIS — I2510 Atherosclerotic heart disease of native coronary artery without angina pectoris: Secondary | ICD-10-CM

## 2011-05-17 MED ORDER — PRAVASTATIN SODIUM 40 MG PO TABS: 40.0000 mg | ORAL_TABLET | Freq: Every day | ORAL | Status: DC

## 2011-05-17 MED ORDER — METOPROLOL TARTRATE 50 MG PO TABS: 50.0000 mg | ORAL_TABLET | Freq: Two times a day (BID) | ORAL | Status: DC

## 2011-05-17 MED ORDER — LISINOPRIL 20 MG PO TABS: 20.0000 mg | ORAL_TABLET | Freq: Every day | ORAL | Status: DC

## 2011-05-17 NOTE — Assessment & Plan Note (Signed)
Will arrange ABI. He had right fem-fem bypass in April 2011 per Dr. Arbie Cookey and last seen in [facility] in January 2012. No plans for f/u unless needed in [facility] per Dr. Arbie Cookey.

## 2011-05-17 NOTE — Progress Notes (Signed)
History of Present Illness: 66 yo WM with history of CAD, ischemic cardiomyopathy, HTN, hyperlipidemia, ongoing tobacco abuse and known PVD with prior aortobifemoral bypass by Dr. Arbie Cookey in 1992 who is here today for PV follow up.  I saw him first in January 2012.  He was having right hip pain with radiation down to his right knee. No calf pain. The pain improved with rest. He did hurt his back in 2009 and the right leg pain started at that time. He has since had a back surgery in 9/10. This did not help his right leg pain. He had a nerve conduction study in the right leg and was told that it was abnormal.  He had non-invasive dopplers done in our office in February 2011 with ABI  0.45 on the right and 0.56 on the left. The right limb of the aortofemoral bypass graft appeared  to be occluded. The left limb of the bypass was patent. There was dampened flow in the right CFA. Monophasic flow in the SFA and popliteal artery. Left SFA and popliteal with brisk flow. Right ATA and PTA waveforms are monophasic. Left ATA and PTA waveforms are biphasic. Probable tibial disease on left. I arranged a distal aortogram and was found to have severe disease as documented below. He was seen by Dr. Arbie Cookey and underwent fem to fem bypass on 07/21/09.  He has continued to have some left leg pain but his right leg is much better. He has chronic back issues and had two back surgeries last year.   He is here today for follow up. He continues to smoke 1 ppd. His cardiac issues were followed in the past by Dr. Juanda Chance and are now followed by me. His PV issues are followed now by Dr. Arbie Cookey but the patient has no follow up planned with Dr. Arbie Cookey.  He has no chest pain or SOB, near syncope or syncope. His back continues to give him problems. He is having a CT of his back today. Dr. Marikay Alar is following his back issues. He notes not being able to afford "cardiac meds".   His primary care is Delano Metz at [facility].   Past Medical History  Diagnosis Date  . Coronary artery disease     status post DMI RX Taxus stent RCA 206 with susequent Stent LAD and subsequent  stent thrombosis RCA unable to be opened 2006 -neg mv 10/2008  . Ischemic cardiomyopathy     ejection fraction of 30%-35% by echo 8 /2010  . Hyperlipidemia   . PVD (peripheral vascular disease)     status post bilateral aortobifemoral bypass in1992 with recent fem to fembypass April  2011 per DR. Early  . Hypertension   . CHF (congestive heart failure)     class II/III (systolic )  . Cigarette smoker   . S/P lumbar microdiscectomy     12/31/08  . Diabetes mellitus   . Lumbar spinal stenosis     Past Surgical History  Procedure Date  . Taxus stent     placed into his right coronary artery; stent placed to the LAD.    [name] Aorto-bifemoral bypass graft surgery with right fem-fem bypass april 2011] 1992 then 2011  . Appendectomy   . Left shoulder surgery   . Lumbar microdiscectomy 12/31/08    Current Outpatient Prescriptions  Medication Sig Dispense Refill  . albuterol (PROVENTIL HFA;VENTOLIN HFA) 108 (90 BASE) MCG/ACT inhaler Inhale 2 puffs into the lungs as needed.        [name]  aspirin 81 MG tablet Take 81 mg by mouth daily.        . cholecalciferol (VITAMIN D) 1000 UNITS tablet Take 1,000 Units by mouth daily.        [name] ezetimibe (ZETIA) 10 MG tablet Take 1 tablet (10 mg total) by mouth daily.  30 tablet  11  . fish oil-omega-3 fatty acids 1000 MG capsule 1200 mg tab  1 tab daily      . linagliptin (TRADJENTA) 5 MG TABS tablet Take 5 mg by mouth daily.      [name] losartan (COZAAR) 100 MG tablet Take 100 mg by mouth daily.      . Nebivolol HCl (BYSTOLIC) 20 MG TABS Take by mouth. 1 tab daily      . pravastatin (PRAVACHOL) 20 MG tablet Take 1 tablet (20 mg total) by mouth daily.  30 tablet  5  . pregabalin (LYRICA) 75 MG capsule Take 2 tabs twice a day      . tiotropium (SPIRIVA) 18 MCG inhalation capsule Place 18 mcg into inhaler  and inhale daily.        . pioglitazone (ACTOS) 30 MG tablet Take 30 mg by mouth daily.          Not on File  History   Social History  . Marital Status: Married    Spouse Name: N/A    Number of Children: 1  . Years of Education: N/A   Occupational History  . retired     Naval architect   Social History Main Topics  . Smoking status: Current Everyday Smoker -- 1.0 packs/day for 50 years    Types: Cigarettes  . Smokeless tobacco: Not on file  . Alcohol Use: Not on file  . Drug Use: Not on file  . Sexually Active: Not on file   Other Topics Concern  . Not on file   Social History Narrative   Lives with girlfriend in [place].      Family History  Problem Relation Age of Onset  . Coronary artery disease    . Diabetes    . Cardiomyopathy Mother   . Coronary artery disease Father   . Stroke Father     Review of Systems:  As stated in the HPI and otherwise negative.   BP 134/75  Pulse 64  Ht 5\' 7"  (1.702 m)  Wt 198 lb (89.812 kg)  BMI 31.01 kg/m2  Physical Examination: General: Well developed, well nourished, NAD HEENT: OP clear, mucus membranes moist SKIN: warm, dry. No rashes. Neuro: No focal deficits Musculoskeletal: Muscle strength 5/5 all ext Psychiatric: Mood and affect normal Neck: No JVD, no carotid bruits, no thyromegaly, no lymphadenopathy. Lungs:Clear bilaterally, no wheezes, rhonci, crackles Cardiovascular: Regular rate and rhythm. No murmurs, gallops or rubs. Abdomen:Soft. Bowel sounds present. Non-tender.  Extremities: No lower extremity edema. Pulses are 2 + in the bilateral DP/PT.  EKG: Sinus brady, rate 64 bpm. Non-specific T wave changes.

## 2011-05-17 NOTE — Assessment & Plan Note (Signed)
Smoking cessation encouraged but he does not wish to stop.

## 2011-05-17 NOTE — Assessment & Plan Note (Signed)
Continue medical therapy. See changes above.

## 2011-05-17 NOTE — Patient Instructions (Signed)
Your physician wants you to follow-up in: 6 months.  You will receive a reminder letter in the mail two months in advance. If you don't receive a letter, please call our office to schedule the follow-up appointment.  Your physician has requested that you have an ankle brachial index (ABI). During this test an ultrasound and blood pressure cuff are used to evaluate the arteries that supply the arms and legs with blood. Allow thirty minutes for this exam. There are no restrictions or special instructions.   Your physician recommends that you return for fasting lab work in: 3 months. Lipid and Liver profile.  Your physician has recommended you make the following change in your medication:  Stop Cozaar. Stop Zetia. Stop Bystolic.  Start Lisinopril 20 mg by mouth daily. Start Lopressor 50 mg by mouth twice daily. Increase Pravastatin to 40 mg by mouth daily.

## 2011-05-17 NOTE — Assessment & Plan Note (Signed)
Stable. Because of cost, will change bystolic to Lopressor 50 mg po BID. Will change Cozaar to Lisinopril 20 mg po Qdaily. Will d/c Zetia and will increase his Pravastatin to 40 mg po QHS.

## 2011-05-17 NOTE — Progress Notes (Signed)
Addended by: Dossie Arbour on: 05/17/2011 11:43 AM   Modules accepted: Orders

## 2011-05-26 ENCOUNTER — Encounter (INDEPENDENT_AMBULATORY_CARE_PROVIDER_SITE_OTHER): Payer: Medicare Other | Admitting: *Deleted

## 2011-05-26 DIAGNOSIS — I739 Peripheral vascular disease, unspecified: Secondary | ICD-10-CM

## 2011-05-26 DIAGNOSIS — I2510 Atherosclerotic heart disease of native coronary artery without angina pectoris: Secondary | ICD-10-CM

## 2011-07-25 ENCOUNTER — Encounter: Payer: Self-pay | Admitting: Cardiovascular Disease

## 2011-08-10 ENCOUNTER — Telehealth: Payer: Self-pay | Admitting: Cardiovascular Disease

## 2011-08-10 NOTE — Telephone Encounter (Signed)
New msg Pt has had allergic reaction to lisinopril about two weeks ago. He wants to know what else he can take.

## 2011-08-10 NOTE — Telephone Encounter (Signed)
Follow-up:     Returned you phone call. Please call back.

## 2011-08-10 NOTE — Telephone Encounter (Signed)
Spoke with pt's wife. She reports pt was started on 2 antibiotics, 2 inhalers and a medication for his stomach in early April and 2-3 days later he developed facial swelling, hives and feeling of throat closing. Was seen in [place] ED.  Antibiotics were stopped and he was told he may also have allergy to Lisinopril so this was stopped. Pt has seen primary MD since ED visit and medications have been adjusted. Wife is asking about possibility of resuming Lisinopril as she does not think allergic reaction was caused by Lisinopril and would like Dr. Clifton James to handle blood pressure medications. She does not know pt's blood pressure but thinks it was 140/80 when last checked. She is not sure of all pt's medications.  Pt also scheduled to see allergist. I offered wife several appt times with PA's and NP's in our office  but pt is unable to come in for appt. Wife then stated she would like to wait until pt seen by allergist.  I asked her to have primary MD fax [place] office visit notes.

## 2011-08-10 NOTE — Telephone Encounter (Signed)
Left message to call back  

## 2011-12-06 ENCOUNTER — Other Ambulatory Visit (HOSPITAL_COMMUNITY): Payer: Self-pay | Admitting: Neurological Surgery

## 2011-12-14 ENCOUNTER — Encounter: Payer: Self-pay | Admitting: Cardiovascular Disease

## 2011-12-14 ENCOUNTER — Other Ambulatory Visit: Payer: Self-pay | Admitting: Neurological Surgery

## 2011-12-14 ENCOUNTER — Ambulatory Visit (INDEPENDENT_AMBULATORY_CARE_PROVIDER_SITE_OTHER): Payer: Medicare Other | Admitting: Cardiovascular Disease

## 2011-12-14 VITALS — BP 150/80 | HR 90 | Ht 67.0 in | Wt 203.0 lb

## 2011-12-14 DIAGNOSIS — I739 Peripheral vascular disease, unspecified: Secondary | ICD-10-CM

## 2011-12-14 DIAGNOSIS — I2510 Atherosclerotic heart disease of native coronary artery without angina pectoris: Secondary | ICD-10-CM

## 2011-12-14 DIAGNOSIS — M549 Dorsalgia, unspecified: Secondary | ICD-10-CM

## 2011-12-14 MED ORDER — LOSARTAN POTASSIUM 50 MG PO TABS: 50.0000 mg | ORAL_TABLET | Freq: Every day | ORAL | Status: DC

## 2011-12-14 NOTE — Patient Instructions (Addendum)
Your physician wants you to follow-up in:  6 months. You will receive a reminder letter in the mail two months in advance. If you don't receive a letter, please call our office to schedule the follow-up appointment.  Your physician has requested that you have an ankle brachial index (ABI). During this test an ultrasound and blood pressure cuff are used to evaluate the arteries that supply the arms and legs with blood. Allow thirty minutes for this exam. There are no restrictions or special instructions.   Your physician has requested that you have an echocardiogram. Echocardiography is a painless test that uses sound waves to create images of your heart. It provides your doctor with information about the size and shape of your heart and how well your heart's chambers and valves are working. This procedure takes approximately one hour. There are no restrictions for this procedure.   Your physician has recommended you make the following change in your medication: Start Cozaar 50 mg by mouth daily

## 2011-12-14 NOTE — Progress Notes (Addendum)
History of Present Illness: 66 yo WM with history of CAD, ischemic cardiomyopathy, HTN, hyperlipidemia, ongoing tobacco abuse and known PVD with prior aortobifemoral bypass by Dr. Arbie Cookey in 1992 who is here today for cardiac and PV follow up. I saw him first in January 2012. He was having right hip pain with radiation down to his right knee. No calf pain. The pain improved with rest. He did hurt his back in 2009 and the right leg pain started at that time. He has since had a back surgery in 9/10. This did not help his right leg pain. He had a nerve conduction study in the right leg and was told that it was abnormal. He had non-invasive dopplers done in our office in February 2011 with ABI 0.45 on the right and 0.56 on the left. The right limb of the aortofemoral bypass graft appeared to be occluded. The left limb of the bypass was patent. There was dampened flow in the right CFA. Monophasic flow in the SFA and popliteal artery. Left SFA and popliteal with brisk flow. Right ATA and PTA waveforms are monophasic. Left ATA and PTA waveforms are biphasic. Probable tibial disease on left. I arranged a distal aortogram and was found to have severe disease as documented below. He was seen by Dr. Arbie Cookey and underwent fem to fem bypass on 07/21/09. He has had some pain in his right leg over the last year. [name] He continues to have chronic back issues.  Dr. Marikay Alar is following his back issues.ABI February 2013 normal bilaterally. He had tongue swelling and rash with Lisinopril.   He is here today for follow up. He continues to smoke daily but has also been using an e-cigarette to help cut back.  He has no chest pain or SOB, near syncope or syncope. His back continues to give him problems. He continues to have some pain in his right leg, mostly along lateral edge of right thigh.   Primary Care Physician: Delano Metz at [facility].   Last Lipid Profile:  Followed in primary care.   07/25/11: Total  chol: 207   HDL: 37  LDL: 126   Past Medical History  Diagnosis Date  . Coronary artery disease     status post DMI RX Taxus stent RCA 2006 with susequent Stent LAD and subsequent  stent thrombosis RCA unable to be opened 2006 -neg mv 10/2008  . Ischemic cardiomyopathy     ejection fraction of 30%-35% by echo 8 /2010  . Hyperlipidemia   . PVD (peripheral vascular disease)     status post bilateral aortobifemoral bypass in1992 with recent fem to fembypass April  2011 per DR. Early  . Hypertension   . CHF (congestive heart failure)     class II/III (systolic )  . Cigarette smoker   . S/P lumbar microdiscectomy     12/31/08  . Diabetes mellitus   . Lumbar spinal stenosis     Past Surgical History  Procedure Date  . Taxus stent     placed into his right coronary artery; stent placed to the LAD.    [name] Aorto-bifemoral bypass graft surgery with right fem-fem bypass april 2011] 1992 then 2011  . Appendectomy   . Left shoulder surgery   . Lumbar microdiscectomy 12/31/08    Current Outpatient Prescriptions  Medication Sig Dispense Refill  . albuterol (PROVENTIL HFA;VENTOLIN HFA) 108 (90 BASE) MCG/ACT inhaler Inhale 2 puffs into the lungs as needed.        [name]  aspirin 81 MG tablet Take 81 mg by mouth daily.        . cholecalciferol (VITAMIN D) 1000 UNITS tablet Take 1,000 Units by mouth daily.        [name] HYDROcodone-acetaminophen (VICODIN) 5-500 MG per tablet Take 1 tablet by mouth every 6 (six) hours as needed.      . linagliptin (TRADJENTA) 5 MG TABS tablet Take 5 mg by mouth daily.      . metoprolol (LOPRESSOR) 50 MG tablet Take 1 tablet (50 mg total) by mouth 2 (two) times daily.  60 tablet  11  . Multiple Vitamin (MULTIVITAMIN) tablet Take 1 tablet by mouth daily.      . pioglitazone (ACTOS) 30 MG tablet Take 30 mg by mouth daily.        . pravastatin (PRAVACHOL) 40 MG tablet Take 1 tablet (40 mg total) by mouth daily.  30 tablet  11  . pregabalin (LYRICA) 75 MG capsule Take 1 tabs  twice a day      . tiotropium (SPIRIVA) 18 MCG inhalation capsule Place 18 mcg into inhaler and inhale. If needed        No Known Allergies  History   Social History  . Marital Status: Married    Spouse Name: N/A    Number of Children: 1  . Years of Education: N/A   Occupational History  . retired     Naval architect   Social History Main Topics  . Smoking status: Current Everyday Smoker -- 1.0 packs/day for 50 years    Types: Cigarettes  . Smokeless tobacco: Not on file  . Alcohol Use: Not on file  . Drug Use: Not on file  . Sexually Active: Not on file   Other Topics Concern  . Not on file   Social History Narrative   Lives with girlfriend in [place].      Family History  Problem Relation Age of Onset  . Coronary artery disease    . Diabetes    . Cardiomyopathy Mother   . Coronary artery disease Father   . Stroke Father     Review of Systems:  As stated in the HPI and otherwise negative.   BP 150/80  Pulse 90  Ht 5\' 7"  (1.702 m)  Wt 203 lb (92.08 kg)  BMI 31.79 kg/m2  Physical Examination: General: Well developed, well nourished, NAD HEENT: OP clear, mucus membranes moist SKIN: warm, dry. No rashes. Neuro: No focal deficits Musculoskeletal: Muscle strength 5/5 all ext Psychiatric: Mood and affect normal Neck: No JVD, no carotid bruits, no thyromegaly, no lymphadenopathy. Lungs:Clear bilaterally, no wheezes, rhonci, crackles Cardiovascular: Regular rate and rhythm. No murmurs, gallops or rubs. Abdomen:Soft. Bowel sounds present. Non-tender.  Extremities: No lower extremity edema. Pulses are trace in the bilateral DP/PT.   Assessment and Plan:   1. CAD, NATIVE VESSEL: Stable. No chest pain. Will continue ASA, beta blocker. I had changed him to Lisinopril in January due to cost but he had a rash and tongue swelling. Lisinopril was stopped. Will restart Cozaar 50 mg po Qdaily. Continue statin.    2. CARDIOMYOPATHY, ISCHEMIC: Continue medical therapy.  See changes above. Repeat echo to assess LVEF.   3. PVD: Stable. I do not think his right leg pain is related to his PAD. Will arrange f/u ABI next month. He had right fem-fem bypass in April 2011 per Dr. Arbie Cookey and last seen in [facility] in January 2012. No plans for f/u unless needed in [facility] per Dr.  Early.   4. TOBACCO ABUSE: Smoking cessation recommended. He is using an e-cigarette.   5. HTN: BP elevated off of Lisinopril. Will restart Cozaar 50 mg po Qdaily.

## 2011-12-20 ENCOUNTER — Ambulatory Visit
Admission: RE | Admit: 2011-12-20 | Discharge: 2011-12-20 | Disposition: A | Discharge status: Home / Self Care | Payer: Worker's Compensation | Source: Ambulatory Visit | Attending: Neurological Surgery | Admitting: Neurological Surgery

## 2011-12-20 VITALS — BP 137/70 | HR 79

## 2011-12-20 DIAGNOSIS — M549 Dorsalgia, unspecified: Secondary | ICD-10-CM

## 2011-12-20 MED ORDER — IOHEXOL 180 MG/ML  SOLN
15.0000 mL | Freq: Once | INTRAMUSCULAR | Status: AC | PRN
  Administered 2011-12-20: 15 mL via INTRATHECAL

## 2011-12-20 MED ORDER — DIAZEPAM 5 MG PO TABS
5.0000 mg | ORAL_TABLET | Freq: Once | ORAL | Status: AC
  Administered 2011-12-20: 5 mg via ORAL

## 2011-12-22 ENCOUNTER — Ambulatory Visit (HOSPITAL_COMMUNITY): Payer: Medicare Other | Attending: Cardiovascular Disease | Admitting: Radiology

## 2011-12-22 DIAGNOSIS — I509 Heart failure, unspecified: Secondary | ICD-10-CM | POA: Insufficient documentation

## 2011-12-22 DIAGNOSIS — I2589 Other forms of chronic ischemic heart disease: Secondary | ICD-10-CM | POA: Insufficient documentation

## 2011-12-22 DIAGNOSIS — I2510 Atherosclerotic heart disease of native coronary artery without angina pectoris: Secondary | ICD-10-CM | POA: Insufficient documentation

## 2011-12-22 DIAGNOSIS — I10 Essential (primary) hypertension: Secondary | ICD-10-CM | POA: Insufficient documentation

## 2011-12-22 DIAGNOSIS — E119 Type 2 diabetes mellitus without complications: Secondary | ICD-10-CM | POA: Insufficient documentation

## 2011-12-22 DIAGNOSIS — I059 Rheumatic mitral valve disease, unspecified: Secondary | ICD-10-CM | POA: Insufficient documentation

## 2011-12-22 DIAGNOSIS — F17200 Nicotine dependence, unspecified, uncomplicated: Secondary | ICD-10-CM | POA: Insufficient documentation

## 2011-12-22 NOTE — Progress Notes (Signed)
Echocardiogram performed.  

## 2011-12-28 ENCOUNTER — Encounter (INDEPENDENT_AMBULATORY_CARE_PROVIDER_SITE_OTHER): Payer: Medicare Other

## 2011-12-28 DIAGNOSIS — I739 Peripheral vascular disease, unspecified: Secondary | ICD-10-CM

## 2012-02-07 ENCOUNTER — Other Ambulatory Visit: Payer: Self-pay | Admitting: Neurological Surgery

## 2012-02-07 DIAGNOSIS — M25569 Pain in unspecified knee: Secondary | ICD-10-CM

## 2012-02-08 ENCOUNTER — Other Ambulatory Visit: Payer: Self-pay | Admitting: Neurological Surgery

## 2012-02-08 DIAGNOSIS — Z139 Encounter for screening, unspecified: Secondary | ICD-10-CM

## 2012-02-15 ENCOUNTER — Other Ambulatory Visit: Payer: Self-pay

## 2012-02-17 ENCOUNTER — Ambulatory Visit
Admission: RE | Admit: 2012-02-17 | Discharge: 2012-02-17 | Disposition: A | Discharge status: Home / Self Care | Payer: Medicare Other | Source: Ambulatory Visit | Attending: Neurological Surgery | Admitting: Neurological Surgery

## 2012-02-17 DIAGNOSIS — M25569 Pain in unspecified knee: Secondary | ICD-10-CM

## 2012-02-17 DIAGNOSIS — Z139 Encounter for screening, unspecified: Secondary | ICD-10-CM

## 2012-06-02 ENCOUNTER — Other Ambulatory Visit: Payer: Self-pay | Admitting: Cardiovascular Disease

## 2012-06-15 ENCOUNTER — Ambulatory Visit: Payer: Medicare Other | Admitting: Cardiovascular Disease

## 2012-07-18 ENCOUNTER — Ambulatory Visit (INDEPENDENT_AMBULATORY_CARE_PROVIDER_SITE_OTHER): Payer: Medicare Other | Admitting: Cardiovascular Disease

## 2012-07-18 ENCOUNTER — Other Ambulatory Visit: Payer: Self-pay | Admitting: *Deleted

## 2012-07-18 ENCOUNTER — Encounter: Payer: Self-pay | Admitting: Cardiovascular Disease

## 2012-07-18 VITALS — BP 140/72 | HR 112 | Ht 67.0 in | Wt 191.0 lb

## 2012-07-18 DIAGNOSIS — I2510 Atherosclerotic heart disease of native coronary artery without angina pectoris: Secondary | ICD-10-CM

## 2012-07-18 DIAGNOSIS — I739 Peripheral vascular disease, unspecified: Secondary | ICD-10-CM

## 2012-07-18 DIAGNOSIS — I2589 Other forms of chronic ischemic heart disease: Secondary | ICD-10-CM

## 2012-07-18 DIAGNOSIS — I10 Essential (primary) hypertension: Secondary | ICD-10-CM

## 2012-07-18 DIAGNOSIS — I255 Ischemic cardiomyopathy: Secondary | ICD-10-CM

## 2012-07-18 DIAGNOSIS — R0989 Other specified symptoms and signs involving the circulatory and respiratory systems: Secondary | ICD-10-CM

## 2012-07-18 MED ORDER — METOPROLOL TARTRATE 50 MG PO TABS: 50.0000 mg | ORAL_TABLET | Freq: Two times a day (BID) | ORAL | Status: DC

## 2012-07-18 MED ORDER — LOSARTAN POTASSIUM 50 MG PO TABS: 50.0000 mg | ORAL_TABLET | Freq: Every day | ORAL | Status: DC

## 2012-07-18 MED ORDER — PRAVASTATIN SODIUM 40 MG PO TABS
40.0000 mg | ORAL_TABLET | Freq: Every day | ORAL | Status: DC
Start: 2012-07-18 — End: 2012-07-18

## 2012-07-18 MED ORDER — PRAVASTATIN SODIUM 40 MG PO TABS: 40.0000 mg | ORAL_TABLET | Freq: Every day | ORAL | Status: DC

## 2012-07-18 NOTE — Progress Notes (Signed)
History of Present Illness: 67 yo WM with history of CAD, ischemic cardiomyopathy, HTN, hyperlipidemia, ongoing tobacco abuse and known PVD with prior aortobifemoral bypass by Dr. Arbie Cookey in 1992 who is here today for cardiac and PV follow up. I saw him for the first time in January 2012. He was having right hip pain with radiation down to his right knee. No calf pain. The pain improved with rest. He did hurt his back in 2009 and the right leg pain started at that time. He has since had a back surgery in 9/10. This did not help his right leg pain. He had a nerve conduction study in the right leg and was told that it was abnormal. He had non-invasive dopplers done in our office in February 2011 with ABI 0.45 on the right and 0.56 on the left. The right limb of the aortofemoral bypass graft appeared to be occluded. The left limb of the bypass was patent. There was dampened flow in the right CFA. Monophasic flow in the SFA and popliteal artery. Left SFA and popliteal with brisk flow. I arranged a distal aortogram and was found to have severe disease as documented below. He was seen by Dr. Arbie Cookey and underwent fem to fem bypass on 07/21/09. Dr. Marikay Alar is following his chronic back issues. ABI September 2013 normal bilaterally (1.2 on right 0.95 left). He had tongue swelling and rash with Lisinopril.   He is here today for follow up. He continues to smoke daily but has also been using an e-cigarette to help cut back. He has no chest pain or SOB, near syncope or syncope. He continues to have issues with back pain and right thigh pain. He had surgery yesterday for removal of bladder cancer. He is under the care of Dr. Betsy Coder is Rosalita Levan (urology). This is his third bladder surgery. He does note overall fatigue. His blood sugar has been averaging 220 at home. His meds are being adjusted in primary care. He has been out of his metoprolol and Cozaar all week. His legs feel great.    Primary Care Physician: Robb Matar at [facility].   Last Lipid Profile: Followed in primary care. 07/25/11: Total chol: 207 HDL: 37 LDL: 126  Past Medical History  Diagnosis Date  . Ischemic cardiomyopathy     ejection fraction of 30%-35% by echo 8 /2010  . Hyperlipidemia   . Hypertension   . CHF (congestive heart failure)     class II/III (systolic )  . Cigarette smoker   . S/P lumbar microdiscectomy     12/31/08  . Diabetes mellitus   . Lumbar spinal stenosis   . Coronary artery disease     status post DMI RX Taxus stent RCA 2006 with susequent Stent LAD and subsequent  stent thrombosis RCA unable to be opened 2006 -neg mv 10/2008  . PVD (peripheral vascular disease)     status post bilateral aortobifemoral bypass in1992 with recent fem to fembypass April  2011 per DR. Early  . Bladder cancer     resection x3    Past Surgical History  Procedure Laterality Date  . Taxus stent      placed into his right coronary artery; stent placed to the LAD.    [name] Aorto-bifemoral bypass graft surgery with right fem-fem bypass april 2011]  1992 then 2011  . Appendectomy    . Left shoulder surgery    . Lumbar microdiscectomy  12/31/08    Current Outpatient  Prescriptions  Medication Sig Dispense Refill  . albuterol (PROVENTIL HFA;VENTOLIN HFA) 108 (90 BASE) MCG/ACT inhaler Inhale 2 puffs into the lungs as needed.        [name] aspirin 81 MG tablet Take 81 mg by mouth daily.        . cholecalciferol (VITAMIN D) 1000 UNITS tablet Take 1,000 Units by mouth daily.        . ciprofloxacin (CIPRO) 250 MG tablet Take for seven days      . HYDROcodone-acetaminophen (VICODIN) 5-500 MG per tablet Take 1 tablet by mouth every 6 (six) hours as needed.      [name] losartan (COZAAR) 50 MG tablet Take 1 tablet (50 mg total) by mouth daily.  30 tablet  6  . metoprolol (LOPRESSOR) 50 MG tablet Take 1 tablet (50 mg total) by mouth 2 (two) times daily.  60 tablet  6  . Multiple Vitamin (MULTIVITAMIN) tablet Take 1 tablet by mouth  daily.      . pravastatin (PRAVACHOL) 40 MG tablet Take 1 tablet (40 mg total) by mouth daily.  30 tablet  6  . pyridOXINE (VITAMIN B-6) 100 MG tablet Take 100 mg by mouth daily.      [name] tiotropium (SPIRIVA) 18 MCG inhalation capsule Place 18 mcg into inhaler and inhale. If needed      . VICTOZA 18 MG/3ML SOLN injection Once daily       No current facility-administered medications for this visit.    Allergies  Allergen Reactions  . Lisinopril Swelling and Rash    Rash - face and tounge swell    History   Social History  . Marital Status: Married    Spouse Name: N/A    Number of Children: 1  . Years of Education: N/A   Occupational History  . retired     Naval architect   Social History Main Topics  . Smoking status: Current Every Day Smoker -- 1.00 packs/day for 50 years    Types: Cigarettes  . Smokeless tobacco: Not on file  . Alcohol Use: Not on file  . Drug Use: Not on file  . Sexually Active: Not on file   Other Topics Concern  . Not on file   Social History Narrative   Lives with girlfriend in [place].      Family History  Problem Relation Age of Onset  . Coronary artery disease    . Diabetes    . Cardiomyopathy Mother   . Coronary artery disease Father   . Stroke Father     Review of Systems:  As stated in the HPI and otherwise negative.   BP 140/72  Pulse 112  Ht 5\' 7"  (1.702 m)  Wt 191 lb (86.637 kg)  BMI 29.91 kg/m2  Physical Examination: General: Well developed, well nourished, NAD HEENT: OP clear, mucus membranes moist SKIN: warm, dry. No rashes. Neuro: No focal deficits Musculoskeletal: Muscle strength 5/5 all ext Psychiatric: Mood and affect normal Neck: No JVD, right  carotid bruit, no thyromegaly, no lymphadenopathy. Lungs:Clear bilaterally, no wheezes, rhonci, crackles Cardiovascular: Tachy. Regular with ectopy. No murmurs, gallops or rubs. Abdomen:Soft. Bowel sounds present. Non-tender.  Extremities: No lower extremity edema. Pulses  are 1 + in the bilateral DP/PT.  EKG: Sinus tach, rate 112 bpm. PVC. T wave inversions inferior leads, anterolateral leads.   Echo September 2013:  Left ventricle: The cavity size was normal. Wall thickness was increased in a pattern of moderate LVH. Systolic function was moderately reduced. The estimated  ejection fraction was in the range of 35% to 40%. Diffuse hypokinesis. There is mild hypokinesis of the mid-distalanteroseptal myocardium. Doppler parameters are consistent with abnormal left ventricular relaxation (grade 1 diastolic dysfunction). - Left atrium: The atrium was mildly dilated.  Assessment and Plan:   1. CAD, NATIVE VESSEL: Stable. No chest pain. Will continue ASA and statin. Resume beta blocker.  2. CARDIOMYOPATHY, ISCHEMIC: LVEF 35-40% in September 2013. Continue medical therapy.   3. PVD: Stable.   4. TOBACCO ABUSE: Smoking cessation recommended. He is using an e-cigarette.   5. HTN: BP elevated off of Cozaar and Lopressor. Will restart both today.    6. Carotid bruit: Will check carotid artery dopplers.

## 2012-07-18 NOTE — Patient Instructions (Addendum)
Your physician wants you to follow-up in:  6 months. You will receive a reminder letter in the mail two months in advance. If you don't receive a letter, please call our office to schedule the follow-up appointment.  Your physician has requested that you have a carotid duplex. This test is an ultrasound of the carotid arteries in your neck. It looks at blood flow through these arteries that supply the brain with blood. Allow one hour for this exam. There are no restrictions or special instructions.   Your physician has recommended you make the following change in your medication:  Resume aspirin 81 mg by mouth daily. Resume lopressor 50 mg by mouth twice daily. Resume Cozaar 50 mg by mouth daily. Resume pravastatin 40 mg by mouth daily

## 2012-08-08 ENCOUNTER — Encounter (INDEPENDENT_AMBULATORY_CARE_PROVIDER_SITE_OTHER): Payer: Medicare Other

## 2012-08-08 DIAGNOSIS — R0989 Other specified symptoms and signs involving the circulatory and respiratory systems: Secondary | ICD-10-CM

## 2012-08-08 DIAGNOSIS — I6529 Occlusion and stenosis of unspecified carotid artery: Secondary | ICD-10-CM

## 2012-08-14 ENCOUNTER — Telehealth: Payer: Self-pay | Admitting: Cardiovascular Disease

## 2012-08-14 NOTE — Telephone Encounter (Signed)
New Problem:    Patient called in returning your call regarding his latest carotid results.  Please call back.

## 2012-08-14 NOTE — Telephone Encounter (Signed)
Left message to call back  

## 2012-08-15 NOTE — Telephone Encounter (Signed)
Spoke with pt's wife and reviewed carotid doppler results with her.

## 2013-02-08 DIAGNOSIS — M549 Dorsalgia, unspecified: Secondary | ICD-10-CM

## 2013-02-08 HISTORY — DX: Dorsalgia, unspecified: M54.9

## 2013-07-10 ENCOUNTER — Other Ambulatory Visit: Payer: Self-pay | Admitting: *Deleted

## 2013-07-10 DIAGNOSIS — I70219 Atherosclerosis of native arteries of extremities with intermittent claudication, unspecified extremity: Secondary | ICD-10-CM

## 2013-07-23 ENCOUNTER — Other Ambulatory Visit: Payer: Self-pay | Admitting: Cardiovascular Disease

## 2013-08-12 ENCOUNTER — Encounter: Payer: Self-pay | Admitting: Vascular Surgery

## 2013-08-13 ENCOUNTER — Encounter: Payer: Self-pay | Admitting: Vascular Surgery

## 2013-08-13 ENCOUNTER — Ambulatory Visit (INDEPENDENT_AMBULATORY_CARE_PROVIDER_SITE_OTHER): Payer: Medicare HMO | Admitting: Vascular Surgery

## 2013-08-13 ENCOUNTER — Ambulatory Visit (HOSPITAL_COMMUNITY)
Admission: RE | Admit: 2013-08-13 | Discharge: 2013-08-13 | Disposition: A | Discharge status: Home / Self Care | Payer: Medicare HMO | Source: Ambulatory Visit | Attending: Vascular Surgery | Admitting: Vascular Surgery

## 2013-08-13 VITALS — BP 146/76 | HR 91 | Ht 67.0 in | Wt 185.6 lb

## 2013-08-13 DIAGNOSIS — I70219 Atherosclerosis of native arteries of extremities with intermittent claudication, unspecified extremity: Secondary | ICD-10-CM

## 2013-08-13 DIAGNOSIS — Z01810 Encounter for preprocedural cardiovascular examination: Secondary | ICD-10-CM

## 2013-08-13 HISTORY — DX: Atherosclerosis of native arteries of extremities with intermittent claudication, unspecified extremity: I70.219

## 2013-08-13 NOTE — Progress Notes (Signed)
Post-operative Open AAA Repair  History of Present Illness  [name] is a 68 y.o. male who presents post-op aortobifemoral bypass for occlusive disease (Date: 1992) and Left to right fem-fem by pass for right leg claudication.  The patient has progressive lower extremity pain after walking a block.  If he stops and rests the pain goes away in 4-5 min.  He has back pain as the starting point and moves into his hip joints and down both legs equally.  He reports no abdominal pain.  He has had 2 spine surgeries in the past.  For [facility] Use Only  PRE-ADM LIVING: Home  AMB STATUS: Ambulatory  Current Outpatient Prescriptions on File Prior to Visit  Medication Sig Dispense Refill  . albuterol (PROVENTIL HFA;VENTOLIN HFA) 108 (90 BASE) MCG/ACT inhaler Inhale 2 puffs into the lungs as needed.        [name] aspirin 81 MG tablet Take 81 mg by mouth daily.        . cholecalciferol (VITAMIN D) 1000 UNITS tablet Take 1,000 Units by mouth daily.        . ciprofloxacin (CIPRO) 250 MG tablet Take for seven days      . HYDROcodone-acetaminophen (VICODIN) 5-500 MG per tablet Take 1 tablet by mouth every 6 (six) hours as needed.      [name] losartan (COZAAR) 50 MG tablet TAKE ONE (1) TABLET EACH DAY  90 tablet  0  . metoprolol (LOPRESSOR) 50 MG tablet Take 1 tablet (50 mg total) by mouth 2 (two) times daily.  180 tablet  3  . Multiple Vitamin (MULTIVITAMIN) tablet Take 1 tablet by mouth daily.      . pravastatin (PRAVACHOL) 40 MG tablet Take 1 tablet (40 mg total) by mouth daily.  90 tablet  3  . tiotropium (SPIRIVA) 18 MCG inhalation capsule Place 18 mcg into inhaler and inhale. If needed      . VICTOZA 18 MG/3ML SOLN injection Once daily      . pyridOXINE (VITAMIN B-6) 100 MG tablet Take 100 mg by mouth daily.       No current facility-administered medications on file prior to visit.   Past Surgical History   Procedure  Laterality  Date   .  Taxus stent       placed into his right coronary artery; stent  placed to the LAD.   [name]  Aorto-bifemoral bypass graft surgery with right fem-fem bypass april 2011]   1992 then 2011   .  Appendectomy     .  Left shoulder surgery     .  Lumbar microdiscectomy   12/31/08     Allergies   Allergen  Reactions   .  Lisinopril  Swelling and Rash     Rash - face and tounge swell    History    Social History   .  Marital Status:  Married     Spouse Name:  N/A     Number of Children:  1   .  Years of Education:  N/A    Occupational History   .  retired      Administrator    Social History Main Topics   .  Smoking status:  Current Every Day Smoker -- 1.00 packs/day for 50 years     Types:  Cigarettes   .  Smokeless tobacco:  Not on file   .  Alcohol Use:  Not on file   .  Drug Use:  Not  on file   .  Sexually Active:  Not on file     Physical Examination  Filed Vitals:   08/13/13 1503  BP: 146/76  Pulse: 91  Height: 5\' 7"  (1.702 m)  Weight: 185 lb 9.6 oz (84.188 kg)  SpO2: 95%    Gastrointestinal: soft, NTND well healed central abd. Scar Musculoskeletal 5/5 Heart RRR Lungs CTA bil. Skin no rashes, no ulcers Neck no Bruit Vascular Palpable fem-fem by pass graft, palpable popliteal arteries, right PT, left DP palpable   ABI  Right 0.99 Left 1.01 With Biphasic   Medical Decision Making  [name] is a 68 y.o. male who presents s/p Open AAA repair.    Given his history of aortobifemoral bypass repair in 1992 and right leg claudication requiring revision of left fem-right fem.  We will order a CTA of the abdomin and pelvis with contrast to evaluate the grft flow.  He will f/u after study.   This patient was seen in conjunction with Dr. Donnetta Hutching today.  [facility] Office: [phone]   08/13/2013, 3:47 PM    I have examined the patient, reviewed and agree with above. Patient well-known to me from a prior aortobifemoral bypass in 1992. Subsequently had occlusion of the right limb and underwent a  left right fem-fem bypass in 2011. He does have degenerative disc disease but his symptoms are classic for claudication from aortoiliac occlusive disease. His resting pressures are normal. I have recommended CT angiogram to determine if he has a stenosis that could be causing this. We will schedule this at his convenience to see him back for discussion  Rosetta Posner, MD 08/13/2013 5:18 PM

## 2013-08-19 ENCOUNTER — Encounter: Payer: Self-pay | Admitting: Cardiovascular Disease

## 2013-08-19 ENCOUNTER — Ambulatory Visit (INDEPENDENT_AMBULATORY_CARE_PROVIDER_SITE_OTHER): Payer: Medicare HMO | Admitting: Cardiovascular Disease

## 2013-08-19 VITALS — BP 132/72 | HR 86 | Ht 67.0 in | Wt 188.1 lb

## 2013-08-19 DIAGNOSIS — I10 Essential (primary) hypertension: Secondary | ICD-10-CM

## 2013-08-19 DIAGNOSIS — I739 Peripheral vascular disease, unspecified: Secondary | ICD-10-CM

## 2013-08-19 DIAGNOSIS — Z720 Tobacco use: Secondary | ICD-10-CM

## 2013-08-19 DIAGNOSIS — I2510 Atherosclerotic heart disease of native coronary artery without angina pectoris: Secondary | ICD-10-CM

## 2013-08-19 DIAGNOSIS — I255 Ischemic cardiomyopathy: Secondary | ICD-10-CM

## 2013-08-19 DIAGNOSIS — I779 Disorder of arteries and arterioles, unspecified: Secondary | ICD-10-CM

## 2013-08-19 DIAGNOSIS — F17200 Nicotine dependence, unspecified, uncomplicated: Secondary | ICD-10-CM

## 2013-08-19 DIAGNOSIS — I2589 Other forms of chronic ischemic heart disease: Secondary | ICD-10-CM

## 2013-08-19 MED ORDER — LOSARTAN POTASSIUM 50 MG PO TABS: ORAL_TABLET | ORAL | Status: DC

## 2013-08-19 MED ORDER — METOPROLOL TARTRATE 50 MG PO TABS: 50.0000 mg | ORAL_TABLET | Freq: Two times a day (BID) | ORAL | Status: DC

## 2013-08-19 MED ORDER — PRAVASTATIN SODIUM 40 MG PO TABS: 40.0000 mg | ORAL_TABLET | Freq: Every day | ORAL | Status: DC

## 2013-08-19 NOTE — Progress Notes (Signed)
History of Present Illness: 68 yo WM with history of CAD, ischemic cardiomyopathy, HTN, hyperlipidemia, ongoing tobacco abuse and known PVD with prior aortobifemoral bypass by Dr. Donnetta Hutching in 1992 who is here today for cardiac follow up. I saw him for the first time in January 2012 and mainly focused on his PV issues at that time. He was seen by Dr. Donnetta Hutching in [facility] and underwent fem to fem bypass on 07/21/09. His PV issues are followed by Dr. Donnetta Hutching. He had tongue swelling and rash with Lisinopril. He has had bladder cancer and multiple surgeries on his bladder. Last echo 12/22/11 with LVEF=35-40%. Carotid artery dopplers April 2014 with mild bilateral disease.   He is here today for follow up. He continues to smoke daily. He has no chest pain or SOB, near syncope or syncope.   Primary Care Physician: Quintella Reichert at [facility].   Last Lipid Profile: Followed in primary care.   Past Medical History  Diagnosis Date  . Ischemic cardiomyopathy     ejection fraction of 30%-35% by echo 8 /2010  . Hyperlipidemia   . Hypertension   . CHF (congestive heart failure)     class II/III (systolic )  . Cigarette smoker   . S/P lumbar microdiscectomy     12/31/08  . Diabetes mellitus   . Lumbar spinal stenosis   . Coronary artery disease     status post DMI RX Taxus stent RCA 2006 with susequent Stent LAD and subsequent  stent thrombosis RCA unable to be opened 2006 -neg mv 10/2008  . PVD (peripheral vascular disease)     status post bilateral aortobifemoral bypass in1992 with recent fem to fembypass April  2011 per DR. Early  . Bladder cancer     resection x3  . Myocardial infarction     Past Surgical History  Procedure Laterality Date  . Taxus stent      placed into his right coronary artery; stent placed to the LAD.    [name] Aorto-bifemoral bypass graft surgery with right fem-fem bypass april 2011]  1992 then 2011  . Appendectomy    . Left shoulder surgery    . Lumbar  microdiscectomy  12/31/08    Current Outpatient Prescriptions  Medication Sig Dispense Refill  . albuterol (PROVENTIL HFA;VENTOLIN HFA) 108 (90 BASE) MCG/ACT inhaler Inhale 2 puffs into the lungs as needed.        . Alogliptin-Pioglitazone (OSENI) 25-30 MG TABS Take 1 tablet by mouth daily.      [name] aspirin 81 MG tablet Take 81 mg by mouth daily.        . cholecalciferol (VITAMIN D) 1000 UNITS tablet Take 1,000 Units by mouth daily.        . Cyanocobalamin (VITAMIN B-12 PO) Take by mouth daily.      [name] gabapentin (NEURONTIN) 400 MG capsule Take 400 mg by mouth 2 (two) times daily.      [name] HYDROcodone-acetaminophen (VICODIN) 5-500 MG per tablet Take 1 tablet by mouth every 6 (six) hours as needed.      [name] losartan (COZAAR) 50 MG tablet TAKE ONE (1) TABLET EACH DAY  90 tablet  0  . Melatonin 10 MG CAPS Take by mouth as directed.      . metoprolol (LOPRESSOR) 50 MG tablet Take 1 tablet (50 mg total) by mouth 2 (two) times daily.  180 tablet  3  . pravastatin (PRAVACHOL) 40 MG tablet Take 1 tablet (40 mg total) by mouth  daily.  90 tablet  3  . Pregabalin (LYRICA PO) Take by mouth as directed.      . pyridOXINE (VITAMIN B-6) 100 MG tablet Take 100 mg by mouth daily.      [name] tiotropium (SPIRIVA) 18 MCG inhalation capsule Place 18 mcg into inhaler and inhale. If needed      . VICTOZA 18 MG/3ML SOLN injection Once daily       No current facility-administered medications for this visit.    Allergies  Allergen Reactions  . Lisinopril Swelling and Rash    Rash - face and tounge swell    History   Social History  . Marital Status: Married    Spouse Name: N/A    Number of Children: 1  . Years of Education: N/A   Occupational History  . retired     Administrator   Social History Main Topics  . Smoking status: Current Every Day Smoker -- 1.00 packs/day for 50 years    Types: Cigarettes  . Smokeless tobacco: Not on file  . Alcohol Use: Yes     Comment: occ  . Drug Use: No  . Sexual Activity:  Not on file   Other Topics Concern  . Not on file   Social History Narrative   Lives with girlfriend in [place].      Family History  Problem Relation Age of Onset  . Coronary artery disease    . Diabetes    . Cardiomyopathy Mother   . Heart disease Mother   . Hyperlipidemia Mother   . Coronary artery disease Father   . Stroke Father   . Diabetes Father   . Heart disease Father     before age 32  . Hyperlipidemia Father   . Heart attack Father   . Diabetes Sister   . Heart disease Sister     before age 83  . Hyperlipidemia Sister   . Heart attack Sister     Review of Systems:  As stated in the HPI and otherwise negative.   BP 132/72  Pulse 86  Ht 5\' 7"  (1.702 m)  Wt 188 lb 1.9 oz (85.331 kg)  BMI 29.46 kg/m2  Physical Examination: General: Well developed, well nourished, NAD HEENT: OP clear, mucus membranes moist SKIN: warm, dry. No rashes. Neuro: No focal deficits Musculoskeletal: Muscle strength 5/5 all ext Psychiatric: Mood and affect normal Neck: No JVD, right  carotid bruit, no thyromegaly, no lymphadenopathy. Lungs:Clear bilaterally, no wheezes, rhonci, crackles Cardiovascular: Tachy. Regular with ectopy. No murmurs, gallops or rubs. Abdomen:Soft. Bowel sounds present. Non-tender.  Extremities: No lower extremity edema. Pulses are 1 + in the bilateral DP/PT.  EKG: NSR, rate 86 bpm.   Echo September 2013:  Left ventricle: The cavity size was normal. Wall thickness was increased in a pattern of moderate LVH. Systolic function was moderately reduced. The estimated ejection fraction was in the range of 35% to 40%. Diffuse hypokinesis. There is mild hypokinesis of the mid-distalanteroseptal myocardium. Doppler parameters are consistent with abnormal left ventricular relaxation (grade 1 diastolic dysfunction). - Left atrium: The atrium was mildly dilated.  Assessment and Plan:   1. CAD, NATIVE VESSEL: Stable. No chest pain. Will continue ASA, statin  and beta blocker.   2. CARDIOMYOPATHY, ISCHEMIC: LVEF 35-40% in September 2013. Continue medical therapy.   3. PVD: Stable. Followed in [facility].  4. TOBACCO ABUSE: Smoking cessation recommended. He is using an e-cigarette but still smokes small cigars.   5. HTN: BP well controlled.  6. Carotid artery disease: Will repeat carotid artery dopplers in Spring 2016. Will arrange this at f/u appt.

## 2013-08-19 NOTE — Patient Instructions (Signed)
Your physician wants you to follow-up in:  12 months.  You will receive a reminder letter in the mail two months in advance. If you don't receive a letter, please call our office to schedule the follow-up appointment.   

## 2013-09-06 ENCOUNTER — Encounter: Payer: Self-pay | Admitting: Vascular Surgery

## 2013-09-10 ENCOUNTER — Encounter: Payer: Self-pay | Admitting: Vascular Surgery

## 2013-09-10 ENCOUNTER — Other Ambulatory Visit: Payer: Self-pay | Admitting: *Deleted

## 2013-09-10 ENCOUNTER — Ambulatory Visit (INDEPENDENT_AMBULATORY_CARE_PROVIDER_SITE_OTHER): Payer: Medicare HMO | Admitting: Vascular Surgery

## 2013-09-10 ENCOUNTER — Ambulatory Visit
Admission: RE | Admit: 2013-09-10 | Discharge: 2013-09-10 | Disposition: A | Discharge status: Home / Self Care | Payer: Commercial Managed Care - HMO | Source: Ambulatory Visit | Attending: Vascular Surgery | Admitting: Vascular Surgery

## 2013-09-10 VITALS — BP 178/95 | HR 99 | Resp 16 | Ht 67.0 in | Wt 190.0 lb

## 2013-09-10 DIAGNOSIS — I70219 Atherosclerosis of native arteries of extremities with intermittent claudication, unspecified extremity: Secondary | ICD-10-CM

## 2013-09-10 DIAGNOSIS — Z01810 Encounter for preprocedural cardiovascular examination: Secondary | ICD-10-CM

## 2013-09-10 LAB — BUN: BUN: 19 mg/dL (ref 6–23)

## 2013-09-10 LAB — CREATININE, SERUM: Creat: 1.2 mg/dL (ref 0.50–1.35)

## 2013-09-10 MED ORDER — IOHEXOL 350 MG/ML SOLN
80.0000 mL | Freq: Once | INTRAVENOUS | Status: AC | PRN
  Administered 2013-09-10: 80 mL via INTRAVENOUS

## 2013-09-10 NOTE — Progress Notes (Signed)
[facility] [place] HISTORY AND PHYSICAL   CC:  Lower extremity claudication Quintella Reichert, MD  HPI: This is a 68 y.o. male with past aortobifemoral bypass (1992) and right leg leg claudication requiring revision with femoral-femoral bypass presents to the office for follow-up after CTA. He was seen in the office by Dr. Donnetta Hutching on 08/13/13. He reported progressive lower extremity pain after walking. He has a history of two spinal surgeries.   Past Medical History  Diagnosis Date  . Ischemic cardiomyopathy     ejection fraction of 30%-35% by echo 8 /2010  . Hyperlipidemia   . Hypertension   . CHF (congestive heart failure)     class II/III (systolic )  . Cigarette smoker   . S/P lumbar microdiscectomy     12/31/08  . Diabetes mellitus   . Lumbar spinal stenosis   . Coronary artery disease     status post DMI RX Taxus stent RCA 2006 with susequent Stent LAD and subsequent  stent thrombosis RCA unable to be opened 2006 -neg mv 10/2008  . PVD (peripheral vascular disease)     status post bilateral aortobifemoral bypass in1992 with recent fem to fembypass April  2011 per DR. Dyesha Henault  . Bladder cancer     resection x3  . Myocardial infarction    Past Surgical History  Procedure Laterality Date  . Taxus stent      placed into his right coronary artery; stent placed to the LAD.    [name] Aorto-bifemoral bypass graft surgery with right fem-fem bypass april 2011]  1992 then 2011  . Appendectomy    . Left shoulder surgery    . Lumbar microdiscectomy  12/31/08    Allergies  Allergen Reactions  . Lisinopril Swelling and Rash    Rash - face and tounge swell    Current Outpatient Prescriptions  Medication Sig Dispense Refill  . albuterol (PROVENTIL HFA;VENTOLIN HFA) 108 (90 BASE) MCG/ACT inhaler Inhale 2 puffs into the lungs as needed.        . Alogliptin-Pioglitazone (OSENI) 25-30 MG TABS Take 1 tablet by mouth daily.      [name] aspirin 81 MG tablet Take 81 mg by mouth  daily.        . cholecalciferol (VITAMIN D) 1000 UNITS tablet Take 1,000 Units by mouth daily.        . Cyanocobalamin (VITAMIN B-12 PO) Take by mouth daily.      [name] gabapentin (NEURONTIN) 400 MG capsule Take 400 mg by mouth 2 (two) times daily.      [name] HYDROcodone-acetaminophen (VICODIN) 5-500 MG per tablet Take 1 tablet by mouth every 6 (six) hours as needed.      [name] losartan (COZAAR) 50 MG tablet Take one tablet by mouth daily  90 tablet  3  . Melatonin 10 MG CAPS Take by mouth as directed.      . metoprolol (LOPRESSOR) 50 MG tablet Take 1 tablet (50 mg total) by mouth 2 (two) times daily.  180 tablet  3  . pravastatin (PRAVACHOL) 40 MG tablet Take 1 tablet (40 mg total) by mouth daily.  90 tablet  3  . Pregabalin (LYRICA PO) Take by mouth as directed.      . pyridOXINE (VITAMIN B-6) 100 MG tablet Take 100 mg by mouth daily.      [name] tiotropium (SPIRIVA) 18 MCG inhalation capsule Place 18 mcg into inhaler and inhale. If needed      . VICTOZA 18 MG/3ML SOLN injection  Once daily       No current facility-administered medications for this visit.    Family History  Problem Relation Age of Onset  . Coronary artery disease    . Diabetes    . Cardiomyopathy Mother   . Heart disease Mother   . Hyperlipidemia Mother   . Coronary artery disease Father   . Stroke Father   . Diabetes Father   . Heart disease Father     before age 52  . Hyperlipidemia Father   . Heart attack Father   . Diabetes Sister   . Heart disease Sister     before age 33  . Hyperlipidemia Sister   . Heart attack Sister     History   Social History  . Marital Status: Married    Spouse Name: N/A    Number of Children: 1  . Years of Education: N/A   Occupational History  . retired     Administrator   Social History Main Topics  . Smoking status: Current Every Day Smoker -- 1.00 packs/day for 50 years    Types: Cigars  . Smokeless tobacco: Never Used  . Alcohol Use: Yes     Comment: occ  . Drug Use: No  .  Sexual Activity: Not on file   Other Topics Concern  . Not on file   Social History Narrative   Lives with girlfriend in [place].       ROS: [x]  Positive   [ ]  Negative   [ ]  All sytems reviewed and are negative  Cardiovascular: []  chest pain/pressure []  palpitations []  SOB lying flat []  DOE []  pain in legs while walking []  pain in feet when lying flat []  hx of DVT []  hx of phlebitis []  swelling in legs []  varicose veins  Pulmonary: []  productive cough []  asthma []  wheezing  Neurologic: []  weakness in []  arms []  legs []  numbness in []  arms []  legs [] difficulty speaking or slurred speech []  temporary loss of vision in one eye []  dizziness  Hematologic: []  bleeding problems []  problems with blood clotting easily  GI []  vomiting blood []  blood in stool  GU: []  burning with urination []  blood in urine  Psychiatric: []  hx of major depression  Integumentary: []  rashes []  ulcers  Constitutional: []  fever []  chills   PHYSICAL EXAMINATION:  Filed Vitals:   09/10/13 1408  BP: 178/95  Pulse: 99  Resp: 16   Body mass index is 29.75 kg/(m^2).  General:  WDWN in NAD HENT: WNL, normocephalic Eyes: Pupils equal Pulmonary: normal non-labored breathing Skin: without rashes, without ulcers  Vascular Exam/Pulses:DP pulses palpable bilaterally Extremities: without ischemic changes, without Gangrene , without cellulitis; without open wounds;  Musculoskeletal: no muscle wasting or atrophy  Neurologic: A&O X 3; Appropriate Affect ; SENSATION: normal; MOTOR FUNCTION:  moving all extremities equally. Speech is fluent/normal   Non-Invasive Vascular Imaging:  09/10/13 ABI right: 0.99 left 1.01  Pt meds includes: Statin:  yes Beta Blocker:  yes Aspirin:  yes ACEI:  no ARB:  yes Other Antiplatelet/Anticoagulant:  no   ASSESSMENT: 68 y.o. male with degenerative disk disease and lower extremity claudication  PLAN: -CTA on 09/10/13 revealed patent  femoral-femoral graft. -Claudication symptoms likely due to overlap of degenerative disk disease versus arterial insufficiency.  -ABIs in office today are normal bilaterally.  -No vascular intervention at this time. -Advised patient to follow-up with his neurosurgeon Dr. Sherley Bounds.    Virgina Jock, PA-C [facility]  [phone]  Clinic MD:  Pt seen and examined in conjunction with Dr. Donnetta Hutching  I have examined the patient, reviewed and agree with above. Reviewed CTA with the patient. This reveals wide patency of the aorta oh left femoral bypass and wide patency of the left right fem-fem bypass. No evidence of stenosis. Discuss at length with the patient who understands. He will followup with Dr. Ronnald Ramp regarding possible discogenic pain  Rosetta Posner, MD 09/10/2013 3:09 PM

## 2014-08-16 ENCOUNTER — Other Ambulatory Visit: Payer: Self-pay | Admitting: Cardiovascular Disease

## 2014-09-05 ENCOUNTER — Ambulatory Visit: Payer: Medicare HMO | Admitting: Cardiovascular Disease

## 2014-11-26 ENCOUNTER — Encounter: Payer: Self-pay | Admitting: Cardiovascular Disease

## 2014-11-26 ENCOUNTER — Ambulatory Visit (INDEPENDENT_AMBULATORY_CARE_PROVIDER_SITE_OTHER): Payer: Medicare HMO | Admitting: Cardiovascular Disease

## 2014-11-26 VITALS — BP 138/70 | HR 67 | Ht 67.0 in | Wt 179.8 lb

## 2014-11-26 DIAGNOSIS — I2510 Atherosclerotic heart disease of native coronary artery without angina pectoris: Secondary | ICD-10-CM

## 2014-11-26 DIAGNOSIS — I739 Peripheral vascular disease, unspecified: Secondary | ICD-10-CM

## 2014-11-26 DIAGNOSIS — I779 Disorder of arteries and arterioles, unspecified: Secondary | ICD-10-CM

## 2014-11-26 DIAGNOSIS — I10 Essential (primary) hypertension: Secondary | ICD-10-CM | POA: Diagnosis not present

## 2014-11-26 DIAGNOSIS — Z720 Tobacco use: Secondary | ICD-10-CM

## 2014-11-26 DIAGNOSIS — I255 Ischemic cardiomyopathy: Secondary | ICD-10-CM

## 2014-11-26 MED ORDER — LOSARTAN POTASSIUM 50 MG PO TABS: ORAL_TABLET | ORAL | Status: DC

## 2014-11-26 MED ORDER — PRAVASTATIN SODIUM 40 MG PO TABS: 40.0000 mg | ORAL_TABLET | Freq: Every day | ORAL | Status: DC

## 2014-11-26 MED ORDER — METOPROLOL TARTRATE 50 MG PO TABS: ORAL_TABLET | ORAL | Status: DC

## 2014-11-26 NOTE — Patient Instructions (Signed)
Medication Instructions:  Your physician recommends that you continue on your current medications as directed. Please refer to the Current Medication list given to you today.  Labwork: NONE  Testing/Procedures: Your physician has requested that you have a carotid duplex. This test is an ultrasound of the carotid arteries in your neck. It looks at blood flow through these arteries that supply the brain with blood. Allow one hour for this exam. There are no restrictions or special instructions.  Follow-Up: Your physician wants you to follow-up in: 12 months with Dr. Angelena Form. You will receive a reminder letter in the mail two months in advance. If you don't receive a letter, please call our office to schedule the follow-up appointment.   Any Other Special Instructions Will Be Listed Below (If Applicable).

## 2014-11-26 NOTE — Progress Notes (Signed)
Chief Complaint  Patient presents with  . Follow-up     History of Present Illness: 69 yo WM with history of CAD, ischemic cardiomyopathy, HTN, hyperlipidemia, ongoing tobacco abuse and known PVD with prior aortobifemoral bypass by Dr. Donnetta Hutching in 1992 who is here today for cardiac follow up. I saw him for the first time in January 2012 and mainly focused on his PV issues at that time. He was seen by Dr. Donnetta Hutching in [facility] and underwent fem to fem bypass on 07/21/09. His PV issues are followed by Dr. Donnetta Hutching. He had tongue swelling and rash with Lisinopril. He has had bladder cancer and multiple surgeries on his bladder. Last echo 12/22/11 with LVEF=35-40%. Carotid artery dopplers April 2014 with mild bilateral disease.   He is here today for follow up. He continues to smoke daily. He has no chest pain or SOB, near syncope or syncope.   Primary Care Physician: Quintella Reichert at [facility].   Last Lipid Profile: Followed in primary care.   Past Medical History  Diagnosis Date  . Ischemic cardiomyopathy     ejection fraction of 30%-35% by echo 8 /2010  . Hyperlipidemia   . Hypertension   . CHF (congestive heart failure)     class II/III (systolic )  . Cigarette smoker   . S/P lumbar microdiscectomy     12/31/08  . Diabetes mellitus   . Lumbar spinal stenosis   . Coronary artery disease     status post DMI RX Taxus stent RCA 2006 with susequent Stent LAD and subsequent  stent thrombosis RCA unable to be opened 2006 -neg mv 10/2008  . PVD (peripheral vascular disease)     status post bilateral aortobifemoral bypass in1992 with recent fem to fembypass April  2011 per DR. Early  . Bladder cancer     resection x3  . Myocardial infarction     Past Surgical History  Procedure Laterality Date  . Taxus stent      placed into his right coronary artery; stent placed to the LAD.    [name] Aorto-bifemoral bypass graft surgery with right fem-fem bypass april 2011]  1992 then 2011  .  Appendectomy    . Left shoulder surgery    . Lumbar microdiscectomy  12/31/08    Current Outpatient Prescriptions  Medication Sig Dispense Refill  . albuterol (PROVENTIL HFA;VENTOLIN HFA) 108 (90 BASE) MCG/ACT inhaler Inhale 2 puffs into the lungs as needed.      . Alogliptin-Pioglitazone (OSENI) 25-30 MG TABS Take 1 tablet by mouth daily.    [name] aspirin 81 MG tablet Take 81 mg by mouth daily.      . cholecalciferol (VITAMIN D) 1000 UNITS tablet Take 1,000 Units by mouth daily.      . fluticasone (FLONASE) 50 MCG/ACT nasal spray Place 2 sprays into both nostrils daily.     [name] gabapentin (NEURONTIN) 400 MG capsule Take 400 mg by mouth 2 (two) times daily.    [name] HYDROcodone-acetaminophen (VICODIN) 5-500 MG per tablet Take 1 tablet by mouth every 6 (six) hours as needed.    [name] losartan (COZAAR) 50 MG tablet Take one tablet by mouth daily 90 tablet 3  . Melatonin 10 MG CAPS Take by mouth as directed.    . metoprolol (LOPRESSOR) 50 MG tablet TAKE 1 TABLET BY MOUTH TWICE(2) DAILY 180 tablet 0  . pioglitazone (ACTOS) 30 MG tablet Take 30 mg by mouth daily.     . pravastatin (PRAVACHOL) 40  MG tablet Take 1 tablet (40 mg total) by mouth daily. 90 tablet 3  . pravastatin (PRAVACHOL) 80 MG tablet     . Pregabalin (LYRICA PO) Take by mouth as directed.    . pyridOXINE (VITAMIN B-6) 100 MG tablet Take 100 mg by mouth daily.    [name] tiotropium (SPIRIVA) 18 MCG inhalation capsule Place 18 mcg into inhaler and inhale. If needed    . trimethoprim-polymyxin b (POLYTRIM) ophthalmic solution     . VICTOZA 18 MG/3ML SOLN injection Once daily     No current facility-administered medications for this visit.    Allergies  Allergen Reactions  . Lisinopril Swelling and Rash    Rash - face and tounge swell    Social History   Social History  . Marital Status: Married    Spouse Name: N/A  . Number of Children: 1  . Years of Education: N/A   Occupational History  . retired     Administrator   Social History  Main Topics  . Smoking status: Current Every Day Smoker -- 1.00 packs/day for 50 years    Types: Cigars  . Smokeless tobacco: Never Used  . Alcohol Use: Yes     Comment: occ  . Drug Use: No  . Sexual Activity: Not on file   Other Topics Concern  . Not on file   Social History Narrative   Lives with girlfriend in [place].      Family History  Problem Relation Age of Onset  . Coronary artery disease    . Diabetes    . Cardiomyopathy Mother   . Heart disease Mother   . Hyperlipidemia Mother   . Coronary artery disease Father   . Stroke Father   . Diabetes Father   . Heart disease Father     before age 91  . Hyperlipidemia Father   . Heart attack Father   . Diabetes Sister   . Heart disease Sister     before age 43  . Hyperlipidemia Sister   . Heart attack Sister     Review of Systems:  As stated in the HPI and otherwise negative.   BP 138/70 mmHg  Pulse 67  Ht 5\' 7"  (1.702 m)  Wt 179 lb 12.8 oz (81.557 kg)  BMI 28.15 kg/m2  SpO2 97%  Physical Examination: General: Well developed, well nourished, NAD HEENT: OP clear, mucus membranes moist SKIN: warm, dry. No rashes. Neuro: No focal deficits Musculoskeletal: Muscle strength 5/5 all ext Psychiatric: Mood and affect normal Neck: No JVD, right  carotid bruit, no thyromegaly, no lymphadenopathy. Lungs:Clear bilaterally, no wheezes, rhonci, crackles Cardiovascular: Tachy. Regular with ectopy. No murmurs, gallops or rubs. Abdomen:Soft. Bowel sounds present. Non-tender.  Extremities: No lower extremity edema. Pulses are 1 + in the bilateral DP/PT.  Echo September 2013:  Left ventricle: The cavity size was normal. Wall thickness was increased in a pattern of moderate LVH. Systolic function was moderately reduced. The estimated ejection fraction was in the range of 35% to 40%. Diffuse hypokinesis. There is mild hypokinesis of the mid-distalanteroseptal myocardium. Doppler parameters are consistent with abnormal  left ventricular relaxation (grade 1 diastolic dysfunction). - Left atrium: The atrium was mildly dilated.  EKG:  EKG is not ordered today. The ekg ordered today demonstrates   Recent Labs: No results found for requested labs within last 365 days.   Lipid Panel    Component Value Date/Time   CHOL 149 11/07/2006 0958   TRIG 101 11/07/2006 0958  HDL 24.4* 11/07/2006 0958   CHOLHDL 6.1 CALC 11/07/2006 0958   VLDL 20 11/07/2006 0958   LDLCALC 104* 11/07/2006 0958     Wt Readings from Last 3 Encounters:  11/26/14 179 lb 12.8 oz (81.557 kg)  09/10/13 190 lb (86.183 kg)  08/19/13 188 lb 1.9 oz (85.331 kg)     Other studies Reviewed: Additional studies/ records that were reviewed today include: . Review of the above records demonstrates:    Assessment and Plan:   1. CAD, NATIVE VESSEL: Stable. No chest pain. Will continue ASA, statin and beta blocker. He does not wish to pursue stress testing. Lipids well controlled in primary care.   2. CARDIOMYOPATHY, ISCHEMIC: LVEF 35-40% in September 2013. Continue medical therapy.   3. PVD: Stable. Followed in [facility].  4. TOBACCO ABUSE: Smoking cessation recommended. I spent ten minutes on counseling today.    5. HTN: BP well controlled. No changes  6. Carotid artery disease: Mild disease by dopplers 2014. Will repeat carotid artery dopplers now.   Current medicines are reviewed at length with the patient today.  The patient does not have concerns regarding medicines.  The following changes have been made:  no change  Labs/ tests ordered today include:  No orders of the defined types were placed in this encounter.    Disposition:   FU with me in 12  months  Signed, Lauree Chandler, MD 11/26/2014 9:03 AM    [facility] [address], [place], [place]  [address] Phone: [phone]; Fax: [phone]

## 2014-12-03 ENCOUNTER — Ambulatory Visit (HOSPITAL_COMMUNITY)
Admission: RE | Admit: 2014-12-03 | Discharge: 2014-12-03 | Disposition: A | Discharge status: Home / Self Care | Payer: Medicare HMO | Source: Ambulatory Visit | Attending: Cardiology | Admitting: Cardiology

## 2014-12-03 DIAGNOSIS — I6523 Occlusion and stenosis of bilateral carotid arteries: Secondary | ICD-10-CM | POA: Diagnosis not present

## 2014-12-03 DIAGNOSIS — I2510 Atherosclerotic heart disease of native coronary artery without angina pectoris: Secondary | ICD-10-CM | POA: Diagnosis not present

## 2015-01-12 DIAGNOSIS — M545 Low back pain, unspecified: Secondary | ICD-10-CM | POA: Insufficient documentation

## 2015-01-12 HISTORY — DX: Low back pain, unspecified: M54.50

## 2015-02-18 ENCOUNTER — Telehealth: Payer: Self-pay | Admitting: Cardiovascular Disease

## 2015-02-18 NOTE — Telephone Encounter (Signed)
New Message  Pt calling to speak w/ Rn concerning documentation from Dr Angelena Form clearing him for [facility] licensure. Please call back and discuss.

## 2015-02-18 NOTE — Telephone Encounter (Signed)
Spoke with pt. 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  Pt reports he is doing well. No chest pain.  If Dr. Angelena Form will write letter pt would like to pick it up in office.

## 2015-02-19 NOTE — Telephone Encounter (Signed)
He has had no recent stress testing (I cannot find any for last five years). Typically this is required to renew CDL. Can we check with him to see if this is required? I can write a letter stating his cardiac status is stable but I think he will need stress testing for the CDL to be renewed. Thanks, chris

## 2015-02-20 NOTE — Telephone Encounter (Signed)
Spoke with pt. He has talked with provider doing CDL exam and stress test not required.  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

## 2015-02-20 NOTE — Telephone Encounter (Signed)
Spoke with pt and gave him information from Dr. Angelena Form.  He will check with provider doing CDL exam to see if stress test needed.  Pt cannot walk on treadmill due to hip pain so will need lexiscan if stress test required.

## 2015-02-20 NOTE — Telephone Encounter (Signed)
Left message to call back  

## 2015-02-25 ENCOUNTER — Encounter: Payer: Self-pay | Admitting: Cardiovascular Disease

## 2015-02-25 NOTE — Telephone Encounter (Signed)
Pt notified letter is ready.  He would like to pick up in office. Letter left at front desk for him to pick up.

## 2015-02-25 NOTE — Telephone Encounter (Signed)
Letter completed. cdm

## 2015-11-23 ENCOUNTER — Other Ambulatory Visit: Payer: Self-pay | Admitting: Cardiovascular Disease

## 2016-01-04 ENCOUNTER — Other Ambulatory Visit: Payer: Self-pay | Admitting: Cardiovascular Disease

## 2016-01-05 ENCOUNTER — Telehealth: Payer: Self-pay | Admitting: Cardiovascular Disease

## 2016-01-05 ENCOUNTER — Other Ambulatory Visit: Payer: Self-pay | Admitting: *Deleted

## 2016-01-05 MED ORDER — PRAVASTATIN SODIUM 40 MG PO TABS: 40.0000 mg | ORAL_TABLET | Freq: Every day | ORAL | 0 refills | Status: DC

## 2016-01-05 NOTE — Telephone Encounter (Signed)
Sent rx for pravastatin to patients pharmacy. A losartan rx was sent in on 01/05/16 for #90 and the metoprolol was sent in on 11/25/15 for #180.

## 2016-01-05 NOTE — Telephone Encounter (Signed)
New Message   *STAT* If patient is at the pharmacy, call can be transferred to refill team.   1. Which medications need to be refilled? (please list name of each medication and dose if known)  Losartan 50 mg tablet once daily Metoprolol 50 mg tablet twice daily Pravastatin 40 mg tablet once daily  2. Which pharmacy/location (including street and city if local pharmacy) is medication to be sent to? [facility], [address] [place], [place]  3. Do they need a 30 day or 90 day supply?  90 day

## 2016-01-06 ENCOUNTER — Encounter (INDEPENDENT_AMBULATORY_CARE_PROVIDER_SITE_OTHER): Payer: Self-pay

## 2016-01-06 ENCOUNTER — Ambulatory Visit (INDEPENDENT_AMBULATORY_CARE_PROVIDER_SITE_OTHER): Payer: Medicare HMO | Admitting: Cardiovascular Disease

## 2016-01-06 ENCOUNTER — Encounter: Payer: Self-pay | Admitting: Cardiovascular Disease

## 2016-01-06 VITALS — BP 166/80 | HR 79 | Ht 67.0 in | Wt 189.2 lb

## 2016-01-06 DIAGNOSIS — I2510 Atherosclerotic heart disease of native coronary artery without angina pectoris: Secondary | ICD-10-CM

## 2016-01-06 DIAGNOSIS — I10 Essential (primary) hypertension: Secondary | ICD-10-CM

## 2016-01-06 DIAGNOSIS — I255 Ischemic cardiomyopathy: Secondary | ICD-10-CM

## 2016-01-06 DIAGNOSIS — Z720 Tobacco use: Secondary | ICD-10-CM

## 2016-01-06 DIAGNOSIS — I739 Peripheral vascular disease, unspecified: Secondary | ICD-10-CM

## 2016-01-06 DIAGNOSIS — I779 Disorder of arteries and arterioles, unspecified: Secondary | ICD-10-CM

## 2016-01-06 NOTE — Patient Instructions (Signed)
Medication Instructions:  Your physician recommends that you continue on your current medications as directed. Please refer to the Current Medication list given to you today.   Labwork: none  Testing/Procedures: Your physician has requested that you have a lexiscan myoview. For further information please visit [email or web address]. Please follow instruction sheet, as given.    Follow-Up: Your physician wants you to follow-up in: 12 months.  You will receive a reminder letter in the mail two months in advance. If you don't receive a letter, please call our office to schedule the follow-up appointment.   Any Other Special Instructions Will Be Listed Below (If Applicable).  Check blood pressure at home for next 2 weeks and call us with these readings.    If you need a refill on your cardiac medications before your next appointment, please call your pharmacy.

## 2016-01-06 NOTE — Progress Notes (Signed)
Chief Complaint  Patient presents with  . Follow-up     History of Present Illness: 70 yo WM with history of CAD, ischemic cardiomyopathy, HTN, hyperlipidemia, ongoing tobacco abuse and known PVD with prior aortobifemoral bypass by Dr. Donnetta Hutching in 1992 who is here today for cardiac follow up. I saw him for the first time in January 2012 and mainly focused on his PV issues at that time. He was seen by Dr. Donnetta Hutching in [facility] and underwent fem to fem bypass on 07/21/09. His PV issues are followed by Dr. Donnetta Hutching. He had tongue swelling and rash with Lisinopril. He has had bladder cancer and multiple surgeries on his bladder. Last echo 12/22/11 with LVEF=35-40%. Carotid artery dopplers April 2014 with mild bilateral disease. He is known to have CAD with inferior MI in 1992, PTCA of RCA at that time. He had an anterior MI in 2000 and had a stent placed in the LAD. In 2006, he had an inferior MI with Taxus stent placed in the RCA. In September 2006, repeat cath with occlusion of RCA in the stent.   He is here today for follow up. He continues to smoke daily. He has no chest pain or SOB, near syncope or syncope. 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 He has had no ischemic testing for the last 6 years.   Primary Care Physician: Quintella Reichert at [facility].   Past Medical History:  Diagnosis Date  . Bladder cancer ([place])    resection x3  . CHF (congestive heart failure) (HCC)    class II/III (systolic )  . Cigarette smoker   . Coronary artery disease    status post DMI RX Taxus stent RCA 2006 with susequent Stent LAD and subsequent  stent thrombosis RCA unable to be opened 2006 -neg mv 10/2008  . Diabetes mellitus   . Hyperlipidemia   . Hypertension   . Ischemic cardiomyopathy    ejection fraction of 30%-35% by echo 8 /2010  . Lumbar spinal stenosis   . Myocardial infarction ([place])   . PVD (peripheral vascular disease) ([place])    status post bilateral aortobifemoral bypass in1992 with recent  fem to fembypass April  2011 per DR. Early  . S/P lumbar microdiscectomy    12/31/08    Past Surgical History:  Procedure Laterality Date  . Aorto-bifemoral bypass graft surgery with right fem-fem bypass April 2011]  1992 then 2011  . APPENDECTOMY    . left shoulder surgery    . LUMBAR MICRODISCECTOMY  12/31/08  . taxus stent     placed into his right coronary artery; stent placed to the LAD.      Current Outpatient Prescriptions  Medication Sig Dispense Refill  . albuterol (PROVENTIL HFA;VENTOLIN HFA) 108 (90 BASE) MCG/ACT inhaler Inhale 2 puffs into the lungs as needed.      . Alogliptin-Pioglitazone (OSENI) 25-30 MG TABS Take 1 tablet by mouth daily.    [name] aspirin 81 MG tablet Take 81 mg by mouth daily.      . cholecalciferol (VITAMIN D) 1000 UNITS tablet Take 1,000 Units by mouth daily.      . fluticasone (FLONASE) 50 MCG/ACT nasal spray Place 2 sprays into both nostrils daily.     [name] gabapentin (NEURONTIN) 400 MG capsule Take 400 mg by mouth 2 (two) times daily.    [name] HYDROcodone-acetaminophen (VICODIN) 5-500 MG per tablet Take 1 tablet by mouth every 6 (six) hours as needed.    [name]  losartan (COZAAR) 50 MG tablet TAKE 1 TABLET BY MOUTH ONCE DAILY 90 tablet 0  . Melatonin 10 MG CAPS Take by mouth as directed.    . metoprolol (LOPRESSOR) 50 MG tablet Take 1 tablet (50 mg total) by mouth 2 (two) times daily. Please call and schedule a one year follow up appointment 180 tablet 0  . pioglitazone (ACTOS) 30 MG tablet Take 30 mg by mouth daily.     . pravastatin (PRAVACHOL) 40 MG tablet Take 1 tablet (40 mg total) by mouth daily. 90 tablet 0  . Pregabalin (LYRICA PO) Take by mouth as directed.    . pyridOXINE (VITAMIN B-6) 100 MG tablet Take 100 mg by mouth daily.    [name] tiotropium (SPIRIVA) 18 MCG inhalation capsule Place 18 mcg into inhaler and inhale. If needed    . trimethoprim-polymyxin b (POLYTRIM) ophthalmic solution     . VICTOZA 18 MG/3ML SOLN injection Once daily     No current  facility-administered medications for this visit.     Allergies  Allergen Reactions  . Lisinopril Swelling and Rash    Rash - face and tounge swell    Social History   Social History  . Marital status: Married    Spouse name: N/A  . Number of children: 1  . Years of education: N/A   Occupational History  . retired Retired    Administrator   Social History Main Topics  . Smoking status: Current Every Day Smoker    Packs/day: 1.00    Years: 50.00    Types: Cigars  . Smokeless tobacco: Never Used  . Alcohol use Yes     Comment: occ  . Drug use: No  . Sexual activity: Not on file   Other Topics Concern  . Not on file   Social History Narrative   Lives with girlfriend in [place].      Family History  Problem Relation Age of Onset  . Coronary artery disease    . Diabetes    . Cardiomyopathy Mother   . Heart disease Mother   . Hyperlipidemia Mother   . Coronary artery disease Father   . Stroke Father   . Diabetes Father   . Heart disease Father     before age 81  . Hyperlipidemia Father   . Heart attack Father   . Diabetes Sister   . Heart disease Sister     before age 66  . Hyperlipidemia Sister   . Heart attack Sister     Review of Systems:  As stated in the HPI and otherwise negative.   BP (!) 166/80 (BP Location: Left Arm, Patient Position: Sitting, Cuff Size: Normal)   Pulse 79   Ht 5\' 7"  (1.702 m)   Wt 85.8 kg (189 lb 3.2 oz)   BMI 29.63 kg/m   Physical Examination: General: Well developed, well nourished, NAD  HEENT: OP clear, mucus membranes moist  SKIN: warm, dry. No rashes. Neuro: No focal deficits  Musculoskeletal: Muscle strength 5/5 all ext  Psychiatric: Mood and affect normal  Neck: No JVD, right  carotid bruit, no thyromegaly, no lymphadenopathy.  Lungs:Clear bilaterally, no wheezes, rhonci, crackles Cardiovascular: Tachy. Regular with ectopy. No murmurs, gallops or rubs. Abdomen:Soft. Bowel sounds present. Non-tender.    Extremities: No lower extremity edema. Pulses are 1 + in the bilateral DP/PT.  Echo September 2013:  Left ventricle: The cavity size was normal. Wall thickness was increased in a pattern of moderate LVH. Systolic function was  moderately reduced. The estimated ejection fraction was in the range of 35% to 40%. Diffuse hypokinesis. There is mild hypokinesis of the mid-distalanteroseptal myocardium. Doppler parameters are consistent with abnormal left ventricular relaxation (grade 1 diastolic dysfunction). - Left atrium: The atrium was mildly dilated.  EKG:  EKG is ordered today. The ekg ordered today demonstrates NSR, rate 79 bpm. T wave inversions inferior leads, unchanged. T wave flattening lateral leads.   Recent Labs: No results found for requested labs within last 8760 hours.   Lipid Panel Followed in primary care   Wt Readings from Last 3 Encounters:  01/06/16 85.8 kg (189 lb 3.2 oz)  11/26/14 81.6 kg (179 lb 12.8 oz)  09/10/13 86.2 kg (190 lb)     Other studies Reviewed: Additional studies/ records that were reviewed today include: . Review of the above records demonstrates:    Assessment and Plan:   1. CAD without angina: Stable. No chest pain. He is known to have CAD with inferior MI in in the early 90s and anterior MI in 2000 with recurrent inferior MI in 2006. He has had no stress testing in the last 6 years as he has refused. Will continue ASA, statin and beta blocker. Will arrange nuclear stress test today. Lipids well controlled in primary care.   2. CARDIOMYOPATHY, ISCHEMIC: LVEF 35-40% in September 2013. Continue medical therapy.   3. PVD: Stable. Followed in [facility].  4. TOBACCO ABUSE: Smoking cessation recommended. I spent ten minutes on counseling today.    5. HTN: BP elevated today. We discussed increasing Cozaar but he will check BP at home before we change it.   6. Carotid artery disease: Mild disease by dopplers August 2016. Repeat 2018.    Current  medicines are reviewed at length with the patient today.  The patient does not have concerns regarding medicines.  The following changes have been made:  no change  Labs/ tests ordered today include:   Orders Placed This Encounter  Procedures  . Myocardial Perfusion Imaging  . EKG 12-Lead    Disposition:   FU with me in 12  months  Signed, Lauree Chandler, MD 01/06/2016 4:45 PM    [facility] [place], [place], [place]  [address] Phone: [phone]; Fax: [phone]

## 2016-01-07 ENCOUNTER — Telehealth (HOSPITAL_COMMUNITY): Payer: Self-pay | Admitting: *Deleted

## 2016-01-07 NOTE — Telephone Encounter (Signed)
Patient given detailed instructions per Myocardial Perfusion Study Information Sheet for the test on  01/12/16. Patient notified to arrive 15 minutes early and that it is imperative to arrive on time for appointment to keep from having the test rescheduled.  If you need to cancel or reschedule your appointment, please call the office within 24 hours of your appointment. Failure to do so may result in a cancellation of your appointment, and a $50 no show fee. Patient verbalized understanding.  Kirstie Peri, RN

## 2016-01-12 ENCOUNTER — Ambulatory Visit (HOSPITAL_COMMUNITY): Payer: Medicare HMO | Attending: Cardiology

## 2016-01-12 DIAGNOSIS — I119 Hypertensive heart disease without heart failure: Secondary | ICD-10-CM | POA: Diagnosis not present

## 2016-01-12 DIAGNOSIS — I2510 Atherosclerotic heart disease of native coronary artery without angina pectoris: Secondary | ICD-10-CM | POA: Diagnosis present

## 2016-01-12 DIAGNOSIS — I255 Ischemic cardiomyopathy: Secondary | ICD-10-CM | POA: Diagnosis not present

## 2016-01-12 DIAGNOSIS — I779 Disorder of arteries and arterioles, unspecified: Secondary | ICD-10-CM | POA: Diagnosis not present

## 2016-01-12 DIAGNOSIS — R9439 Abnormal result of other cardiovascular function study: Secondary | ICD-10-CM | POA: Insufficient documentation

## 2016-01-12 DIAGNOSIS — E119 Type 2 diabetes mellitus without complications: Secondary | ICD-10-CM | POA: Diagnosis not present

## 2016-01-12 LAB — MYOCARDIAL PERFUSION IMAGING
CHL CUP NUCLEAR SSS: 43
CSEPPHR: 90 {beats}/min
LHR: 0.36
LV sys vol: 121 mL
LVDIAVOL: 157 mL (ref 62–150)
Rest HR: 73 {beats}/min
SDS: 6
SRS: 37
TID: 1.27

## 2016-01-12 MED ORDER — TECHNETIUM TC 99M TETROFOSMIN IV KIT
32.5000 | PACK | Freq: Once | INTRAVENOUS | Status: AC | PRN
  Administered 2016-01-12: 33 via INTRAVENOUS
  Filled 2016-01-12: qty 33

## 2016-01-12 MED ORDER — REGADENOSON 0.4 MG/5ML IV SOLN
0.4000 mg | Freq: Once | INTRAVENOUS | Status: AC
  Administered 2016-01-12: 0.4 mg via INTRAVENOUS

## 2016-01-12 MED ORDER — TECHNETIUM TC 99M TETROFOSMIN IV KIT
10.1000 | PACK | Freq: Once | INTRAVENOUS | Status: AC | PRN
  Administered 2016-01-12: 10.1 via INTRAVENOUS
  Filled 2016-01-12: qty 10

## 2016-01-13 ENCOUNTER — Telehealth: Payer: Self-pay | Admitting: Cardiovascular Disease

## 2016-01-13 DIAGNOSIS — I255 Ischemic cardiomyopathy: Secondary | ICD-10-CM

## 2016-01-13 NOTE — Telephone Encounter (Signed)
I spoke with pt and reviewed stress test results with him. Echo set up for October 10. After last office visit pt was to call with blood pressure readings. He reports the following readings over several days. Taken at different times during the day. 153/93,155/84,148/74,153/80,147/86. Will forward to Dr. Angelena Form for review

## 2016-01-14 NOTE — Telephone Encounter (Signed)
Can we increase his Cozaar to 100 mg once daily? Thanks, chris

## 2016-01-15 NOTE — Telephone Encounter (Signed)
Left message to call back  

## 2016-01-18 ENCOUNTER — Encounter: Payer: Self-pay | Admitting: Cardiovascular Disease

## 2016-01-18 MED ORDER — LOSARTAN POTASSIUM 100 MG PO TABS: 100.0000 mg | ORAL_TABLET | Freq: Every day | ORAL | 6 refills | Status: DC

## 2016-01-18 NOTE — Telephone Encounter (Signed)
This encounter was created in error - please disregard.

## 2016-01-18 NOTE — Telephone Encounter (Signed)
New message     Returning nurse call about test results.

## 2016-01-18 NOTE — Telephone Encounter (Signed)
I spoke with the pt's wife and made her aware of stress test results and pending echocardiogram appointment. We will increase Cozaar to 100mg  daily and have pt continue to monitor BP. Rx sent to the pharmacy.

## 2016-01-18 NOTE — Telephone Encounter (Signed)
Left message on machine for pt to contact the office.   

## 2016-01-26 ENCOUNTER — Ambulatory Visit (HOSPITAL_COMMUNITY): Payer: Medicare HMO | Attending: Cardiovascular Disease

## 2016-01-26 ENCOUNTER — Other Ambulatory Visit: Payer: Self-pay

## 2016-01-26 DIAGNOSIS — R9439 Abnormal result of other cardiovascular function study: Secondary | ICD-10-CM | POA: Insufficient documentation

## 2016-01-26 DIAGNOSIS — I255 Ischemic cardiomyopathy: Secondary | ICD-10-CM | POA: Diagnosis not present

## 2016-01-26 DIAGNOSIS — I5189 Other ill-defined heart diseases: Secondary | ICD-10-CM | POA: Insufficient documentation

## 2016-01-27 ENCOUNTER — Telehealth: Payer: Self-pay | Admitting: Cardiovascular Disease

## 2016-01-27 NOTE — Telephone Encounter (Signed)
New message       Returning a call to the nurse.  Please call at 3 or after

## 2016-01-27 NOTE — Telephone Encounter (Signed)
I spoke with pt and reviewed echo results with pt. 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 Address in chart confirmed and echo and stress test results mailed to pt

## 2016-02-08 ENCOUNTER — Telehealth: Payer: Self-pay | Admitting: Cardiovascular Disease

## 2016-02-08 NOTE — Telephone Encounter (Signed)
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  If not, when will it be ready?  Pt has a time limit.

## 2016-02-08 NOTE — Telephone Encounter (Signed)
Spoke with pt. He has not received copy of stress test and echo results which were mailed to him on October 11,2017. He will call me back if he does not receive in a few days and a copy can be left at front desk for him to pick up.

## 2016-02-10 ENCOUNTER — Encounter: Payer: Self-pay | Admitting: Cardiovascular Disease

## 2016-02-11 NOTE — Telephone Encounter (Signed)
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

## 2016-02-11 NOTE — Telephone Encounter (Signed)
Spoke with pt. He has not received results. He states he does not need letter. Only needs stress test and echo results.  Will leave at front desk for him to pick up.

## 2016-02-12 ENCOUNTER — Telehealth: Payer: Self-pay | Admitting: Cardiovascular Disease

## 2016-02-12 ENCOUNTER — Encounter: Payer: Self-pay | Admitting: *Deleted

## 2016-02-12 NOTE — Telephone Encounter (Signed)
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 Please call.

## 2016-02-12 NOTE — Telephone Encounter (Signed)
Spoke with pt. He also needs letter of clearance. Requests this be faxed to [phone]. Phone number is [phone].  I told pt we would send.  Fax number confirmed and letter faxed.

## 2016-03-04 ENCOUNTER — Telehealth: Payer: Self-pay | Admitting: *Deleted

## 2016-03-04 ENCOUNTER — Encounter: Payer: Self-pay | Admitting: Nurse Practitioner

## 2016-03-04 ENCOUNTER — Ambulatory Visit: Payer: Medicare HMO | Admitting: Interventional Cardiology

## 2016-03-04 ENCOUNTER — Ambulatory Visit (INDEPENDENT_AMBULATORY_CARE_PROVIDER_SITE_OTHER): Payer: Medicare HMO | Admitting: Nurse Practitioner

## 2016-03-04 ENCOUNTER — Encounter (INDEPENDENT_AMBULATORY_CARE_PROVIDER_SITE_OTHER): Payer: Self-pay

## 2016-03-04 VITALS — BP 118/66 | HR 46 | Ht 67.0 in | Wt 194.4 lb

## 2016-03-04 DIAGNOSIS — R001 Bradycardia, unspecified: Secondary | ICD-10-CM | POA: Diagnosis not present

## 2016-03-04 DIAGNOSIS — I10 Essential (primary) hypertension: Secondary | ICD-10-CM | POA: Diagnosis not present

## 2016-03-04 DIAGNOSIS — I2510 Atherosclerotic heart disease of native coronary artery without angina pectoris: Secondary | ICD-10-CM

## 2016-03-04 DIAGNOSIS — I255 Ischemic cardiomyopathy: Secondary | ICD-10-CM | POA: Diagnosis not present

## 2016-03-04 MED ORDER — METOPROLOL TARTRATE 50 MG PO TABS: 25.0000 mg | ORAL_TABLET | Freq: Two times a day (BID) | ORAL | 6 refills | Status: DC

## 2016-03-04 MED ORDER — RIVAROXABAN 20 MG PO TABS: 20.0000 mg | ORAL_TABLET | Freq: Every day | ORAL | 6 refills | Status: DC

## 2016-03-04 NOTE — Progress Notes (Signed)
CARDIOLOGY OFFICE NOTE  Date:  03/04/2016    [name] Date of Birth: [birth year] Medical Record [id number]  PCP:  Charletta Cousin, MD  Cardiologist:  [facility]  Chief Complaint  Patient presents with  . Irregular Heart Beat    Work in visit - seen for Dr. Angelena Form    History of Present Illness: [name] is a 70 y.o. male who presents today for a work in visit. Seen for Dr. Angelena Form.   He has a history of known CAD, ischemic cardiomyopathy, HTN, hyperlipidemia, ongoing tobacco abuse and known PVD with prior aortobifemoral bypass by Dr. Donnetta Hutching in 1992.   He was seen for the first time in January 2012 and mainly focused on his PV issues at that time. He was seen by Dr. Donnetta Hutching in [facility] and underwent fem to fem bypass on 07/21/09.  He had tongue swelling and rash with Lisinopril. He has had bladder cancer and multiple surgeries on his bladder. Last echo 12/22/11 with LVEF=35-40%. Carotid artery dopplers April 2014 with mild bilateral disease. He is known to have CAD with inferior MI in 1992, PTCA of RCA at that time. He had an anterior MI in 2000 and had a stent placed in the LAD. In 2006, he had an inferior MI with Taxus stent placed in the RCA. In September 2006, repeat cath with occlusion of RCA in the stent.   Last seen back in September - still smoking. Myoview updated - no ischemia but his EF lower - echo was obtained and this showed EF of 40 to 45%.   Comes in today. Here alone. Sent by PCP due to "not liking the looks of my heart". Was there yesterday - got sent for a CXR and had blood work. No EKG apparently. We do not have records. No records sent to [place].  Was told his HR was slow. He has been dizzy and lightheaded. No frank syncope but has come "close". He typically has some warning and just stops and rests. Has had some chest pain as well. Says this feels more like congestion and not like his prior chest pain syndrome. He is coughing today. Sputum is clear. BP ok.   Past  Medical History:  Diagnosis Date  . Bladder cancer ([place])    resection x3  . CHF (congestive heart failure) ([facility])    class II/III (systolic )  . Cigarette smoker   . Coronary artery disease    status post DMI RX Taxus stent RCA 2006 with susequent Stent LAD and subsequent  stent thrombosis RCA unable to be opened 2006 -neg mv 10/2008  . Diabetes mellitus   . Hyperlipidemia   . Hypertension   . Ischemic cardiomyopathy    ejection fraction of 30%-35% by echo 8 /2010  . Lumbar spinal stenosis   . Myocardial infarction   . PVD (peripheral vascular disease) ([place])    status post bilateral aortobifemoral bypass in1992 with recent fem to fembypass April  2011 per DR. Early  . S/P lumbar microdiscectomy    12/31/08    Past Surgical History:  Procedure Laterality Date  . Aorto-bifemoral bypass graft surgery with right fem-fem bypass April 2011]  1992 then 2011  . APPENDECTOMY    . left shoulder surgery    . LUMBAR MICRODISCECTOMY  12/31/08  . taxus stent     placed into his right coronary artery; stent placed to the LAD.       Medications: Current Outpatient Prescriptions  Medication Sig  Dispense Refill  . albuterol (PROVENTIL HFA;VENTOLIN HFA) 108 (90 BASE) MCG/ACT inhaler Inhale 2 puffs into the lungs as needed.      [name] aspirin 81 MG tablet Take 81 mg by mouth daily.      . cholecalciferol (VITAMIN D) 1000 UNITS tablet Take 1,000 Units by mouth daily.      . cyclobenzaprine (FLEXERIL) 10 MG tablet Take 10 mg by mouth as needed for muscle spasms (back pain).     . fluticasone (FLONASE) 50 MCG/ACT nasal spray Place 2 sprays into both nostrils daily.     [name] gabapentin (NEURONTIN) 400 MG capsule Take 400 mg by mouth 2 (two) times daily.    [name] HYDROcodone-acetaminophen (VICODIN) 5-500 MG per tablet Take 1 tablet by mouth every 6 (six) hours as needed for pain (back/hips).     [name] JANUMET XR 50-1000 MG TB24 Take 1 tablet by mouth daily.     [name] losartan (COZAAR) 100 MG tablet Take 1 tablet (100  mg total) by mouth daily. 30 tablet 6  . Melatonin 10 MG CAPS Take 1 tablet by mouth at bedtime.     . metoprolol (LOPRESSOR) 50 MG tablet Take 1 tablet (50 mg total) by mouth 2 (two) times daily. Please call and schedule a one year follow up appointment 180 tablet 0  . pravastatin (PRAVACHOL) 40 MG tablet Take 1 tablet (40 mg total) by mouth daily. 90 tablet 0  . Pregabalin (LYRICA PO) Take 1 tablet by mouth daily as needed (leg pain).     . tamsulosin (FLOMAX) 0.4 MG CAPS capsule Take 0.4 mg by mouth daily after supper.    . trimethoprim-polymyxin b (POLYTRIM) ophthalmic solution Place 1 drop into both eyes 3 times/day as needed-between meals & bedtime (moisture).     [name] VICTOZA 18 MG/3ML SOLN injection Once daily     No current facility-administered medications for this visit.     Allergies: Allergies  Allergen Reactions  . Lisinopril Swelling and Rash    Rash - face and tounge swell    Social History: The patient  reports that he has been smoking Cigars.  He has a 50.00 pack-year smoking history. He has never used smokeless tobacco. He reports that he drinks alcohol. He reports that he does not use drugs.   Family History: The patient's family history includes Cardiomyopathy in his mother; Coronary artery disease in his father; Diabetes in his father and sister; Heart attack in his father and sister; Heart disease in his father, mother, and sister; Hyperlipidemia in his father, mother, and sister; Stroke in his father.   Review of Systems: Please see the history of present illness.   Otherwise, the review of systems is positive for none.   All other systems are reviewed and negative.   Physical Exam: VS:  BP 118/66   Pulse (!) 46   Ht 5\' 7"  (1.702 m)   Wt 194 lb 6.4 oz (88.2 kg)   BMI 30.45 kg/m  .  BMI Body mass index is 30.45 kg/m.  Wt Readings from Last 3 Encounters:  03/04/16 194 lb 6.4 oz (88.2 kg)  01/12/16 189 lb (85.7 kg)  01/06/16 189 lb 3.2 oz (85.8 kg)     General: Pleasant. Well developed, well nourished and in no acute distress. Little hard of hearing.   HEENT: Normal.  Neck: Supple, no JVD, carotid bruits, or masses noted.  Cardiac: Irregular rhythm. His rate is slow. Heart tones are distant. No edema.  Respiratory:  Lungs are  clear to auscultation bilaterally with normal work of breathing.  GI: Soft and nontender.  MS: No deformity or atrophy. Gait and ROM intact.  Skin: Warm and dry. Color is normal.  Neuro:  Strength and sensation are intact and no gross focal deficits noted.  Psych: Alert, appropriate and with normal affect.   LABORATORY DATA:  EKG:  EKG is ordered today. This demonstrates AF with a very slow VR - rate is 46. Reviewed with Dr. Meda Coffee and Dr. Acie Fredrickson here in the office.  Lab Results  Component Value Date   WBC 7.8 07/19/2010   HGB 15.4 07/19/2010   HCT 45.2 07/19/2010   PLT 188 07/19/2010   GLUCOSE 112 (H) 07/28/2010   CHOL 149 11/07/2006   TRIG 101 11/07/2006   HDL 24.4 (L) 11/07/2006   LDLCALC 104 (H) 11/07/2006   ALT 19 07/20/2009   AST 19 07/20/2009   NA 140 07/19/2010   K 4.7 07/19/2010   CL 102 07/19/2010   CREATININE 1.20 09/10/2013   BUN 19 09/10/2013   CO2 27 07/19/2010   TSH 1.44 11/07/2006   INR 0.93 07/19/2010    BNP (last 3 results) No results for input(s): BNP in the last 8760 hours.  ProBNP (last 3 results) No results for input(s): PROBNP in the last 8760 hours.   Other Studies Reviewed Today:  Echo Study Conclusions from 01/2016  - Left ventricle: Wall thickness was increased in a pattern of   moderate LVH. Systolic function was mildly to moderately reduced.   The estimated ejection fraction was in the range of 40% to 45%.   Akinesis of the mid-apicalanteroseptal myocardium. Features are   consistent with a pseudonormal left ventricular filling pattern,   with concomitant abnormal relaxation and increased filling   pressure (grade 2 diastolic dysfunction).  Notes  Recorded by Burnell Blanks, MD on 01/27/2016 at 10:40 AM EDT Echo shows his LVEF is actually 40-45%. Stress test did not show ischemia. LVEF is better than echo in 2013. 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  There was no ST segment deviation noted during stress.  The left ventricular ejection fraction is severely decreased (<30%).  Findings consistent with prior myocardial infarction.  This is a high risk study.   1. EF 23%, diffuse hypokinesis.  2. Fixed large, severe basal inferior/inferoseptal, mid inferolateral/inferior/inferoseptal, and apical inferior/septal/lateral perfusion defect.  This suggests prior MI without significant ischemia.   High risk study primarily due to low EF.     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 He has a known ischemic cardiomyopathy. The stress test shows no ischemia but LVEF looks worse. No echo since 2013. 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 Chris  Assessment/Plan: 1. New onset AF with slow VR - discussed with Dr. Meda Coffee (DOD) - holding Metoprolol tonight and then will cut back to 25 mg BID - he is to monitor his HR over the weekend. If he has worsening symptoms - needs to go to the ER. See back on Monday with EKG. Also starting Xarelto 20 mg a day - samples given. May need to continue his aspirin given his known CAD.  Trying to get his labs from his PCP -  may need to get lab here on Monday. He has had recent echo noted. No LAE noted. Hope to try and cardiovert after a month of anticoagulation.   2. CAD without angina: Stable. No chest pain. He is known to have CAD with inferior MI in in the early 90s and anterior MI in 2000 with recurrent inferior MI in 2006. Recent Myoview without ischemia  noted.   3. CARDIOMYOPATHY, ISCHEMIC: LVEF 40 to 45% by echo recently. I suspect some of his weakness/dizziness/dyspnea is because he has lost his AV synchrony.   4. PVD: Stable. Followed in [facility].  5. TOBACCO ABUSE: Smoking cessation recommended. He is smoking less - I suspect because he does not feel as well as he was.   6. HTN: BP ok on current regimen  7. Carotid artery disease: Mild disease by dopplers August 2016. Repeat 2018.    Current medicines are reviewed with the patient today.  The patient does not have concerns regarding medicines other than what has been noted above.  The following changes have been made:  See above.  Labs/ tests ordered today include:   No orders of the defined types were placed in this encounter.    Disposition:   FU with me on Monday with EKG  Patient is agreeable to this plan and will call if any problems develop in the interim.   Signed: Burtis Junes, RN, ANP-C 03/04/2016 4:32 PM  [facility] [address] [place], [place]  [address] Phone: [phone] Fax: [phone]

## 2016-03-04 NOTE — Patient Instructions (Addendum)
We will be checking the following labs today - NONE  We need to request your records from your PCP regarding yesterday's visit.   You have atrial fibrillation - this means you are out of rhythm - this increases your risk for stroke - we are putting you on blood thinner to reduce that risk. We will work towards trying to get you back into a regular rhythm.    Medication Instructions:    Continue with your current medicines. BUT  I do NOT want you to not take your Metoprolol tonight and then starting tomorrow - take just 1/2 a pill two times a day  I am starting you on Xarelto 20 mg to take with your evening meal - use the samples I have given you  I may stop your aspirin when I see you on Monday   Testing/Procedures To Be Arranged:  N/A  Follow-Up:   See me on Monday with EKG    Other Special Instructions:   Monitor your heart rate for me several times over the course of this weekend - write the readings down and bring with you on Monday.     If you need a refill on your cardiac medications before your next appointment, please call your pharmacy.   Call the [place] office at [phone] if you have any questions, problems or concerns.

## 2016-03-04 NOTE — Telephone Encounter (Signed)
lvm on Dr. Marcello Moores phone to fax all labs and ov note.  Will call back Monday am to follow up.

## 2016-03-07 ENCOUNTER — Telehealth: Payer: Self-pay | Admitting: *Deleted

## 2016-03-07 ENCOUNTER — Ambulatory Visit (INDEPENDENT_AMBULATORY_CARE_PROVIDER_SITE_OTHER): Payer: Medicare HMO | Admitting: Nurse Practitioner

## 2016-03-07 ENCOUNTER — Encounter: Payer: Self-pay | Admitting: Nurse Practitioner

## 2016-03-07 VITALS — BP 148/70 | HR 56 | Ht 67.0 in | Wt 195.1 lb

## 2016-03-07 DIAGNOSIS — Z720 Tobacco use: Secondary | ICD-10-CM

## 2016-03-07 DIAGNOSIS — I255 Ischemic cardiomyopathy: Secondary | ICD-10-CM | POA: Diagnosis not present

## 2016-03-07 DIAGNOSIS — I2510 Atherosclerotic heart disease of native coronary artery without angina pectoris: Secondary | ICD-10-CM | POA: Diagnosis not present

## 2016-03-07 DIAGNOSIS — R001 Bradycardia, unspecified: Secondary | ICD-10-CM

## 2016-03-07 MED ORDER — FUROSEMIDE 20 MG PO TABS: 20.0000 mg | ORAL_TABLET | Freq: Every day | ORAL | 6 refills | Status: DC

## 2016-03-07 NOTE — Progress Notes (Signed)
CARDIOLOGY OFFICE NOTE  Date:  03/07/2016    [name] Date of Birth: 01/01/1946 Medical Record [id number]  PCP:  Charletta Cousin, MD  Cardiologist:  [facility]    Chief Complaint  Patient presents with  . Dizziness    Follow up visit - seen for Dr. Angelena Form    History of Present Illness: TAIM MILIA is a 70 y.o. male who presents today for a 3 day check. Seen for Dr. Angelena Form.   He has a history of known CAD, ischemic cardiomyopathy, HTN, hyperlipidemia, ongoing tobacco abuse and known PVD with prior aortobifemoral bypass by Dr. Donnetta Hutching in 1992.   He was seen for the first time in January 2012 and mainly focused on his PV issues at that time. He was seen by Dr. Donnetta Hutching in [facility] and underwent fem to fem bypass on 07/21/09.  He had tongue swelling and rash with Lisinopril. He has had bladder cancer and multiple surgeries on his bladder. Last echo 12/22/11 with LVEF=35-40%. Carotid artery dopplers April 2014 with mild bilateral disease. He is known to have CAD with inferior MI in 1992, PTCA of RCA at that time. He had an anterior MI in 2000 and had a stent placed in the LAD. In 2006, he had an inferior MI with Taxus stent placed in the RCA. In September 2006, repeat cath with occlusion of RCA in the stent.   Last seen back in September - still smoking. Myoview updated - no ischemia but his EF lower - echo was obtained and this showed EF of 40 to 45%.   I saw him this past Friday - Sent by PCP due to "not liking the looks of my heart". We did not have his records. Noted to be in AF with slow VR - Xarelto started and Metoprolol cut back.  Comes in today. Here with his wife today. He really does not feel much better - says "maybe a tad". Still dizzy. HR in the 50's now. No frank syncope. Still with a cough - clear sputum. Remains short of breath - seems to be at his baseline. Wife brings her phone that has his recent labs and CXR results. CXR with left lung opacity - ?pneumonia or neoplasm.  Has CT planned for tomorrow at [facility]. Potassium was 5.0. ProBNP 1200. Creatinine 1.4. TSH 4.69. Hgb 15.6  Past Medical History:  Diagnosis Date  . Bladder cancer ([place])    resection x3  . CHF (congestive heart failure) (HCC)    class II/III (systolic )  . Cigarette smoker   . Coronary artery disease    status post DMI RX Taxus stent RCA 2006 with susequent Stent LAD and subsequent  stent thrombosis RCA unable to be opened 2006 -neg mv 10/2008  . Diabetes mellitus   . Hyperlipidemia   . Hypertension   . Ischemic cardiomyopathy    ejection fraction of 30%-35% by echo 8 /2010  . Lumbar spinal stenosis   . Myocardial infarction   . PVD (peripheral vascular disease) ([place])    status post bilateral aortobifemoral bypass in1992 with recent fem to fembypass April  2011 per DR. Early  . S/P lumbar microdiscectomy    12/31/08    Past Surgical History:  Procedure Laterality Date  . Aorto-bifemoral bypass graft surgery with right fem-fem bypass April 2011]  1992 then 2011  . APPENDECTOMY    . left shoulder surgery    . LUMBAR MICRODISCECTOMY  12/31/08  . taxus stent  placed into his right coronary artery; stent placed to the LAD.       Medications: Current Outpatient Prescriptions  Medication Sig Dispense Refill  . albuterol (PROVENTIL HFA;VENTOLIN HFA) 108 (90 BASE) MCG/ACT inhaler Inhale 2 puffs into the lungs as needed.      [name] aspirin 81 MG tablet Take 81 mg by mouth daily.      . cholecalciferol (VITAMIN D) 1000 UNITS tablet Take 1,000 Units by mouth daily.      . cyclobenzaprine (FLEXERIL) 10 MG tablet Take 10 mg by mouth as needed for muscle spasms (back pain).     . fluticasone (FLONASE) 50 MCG/ACT nasal spray Place 2 sprays into both nostrils daily.     [name] gabapentin (NEURONTIN) 400 MG capsule Take 400 mg by mouth 2 (two) times daily.    [name] HYDROcodone-acetaminophen (VICODIN) 5-500 MG per tablet Take 1 tablet by mouth every 6 (six) hours as needed for pain  (back/hips).     . losartan (COZAAR) 100 MG tablet Take 1 tablet (100 mg total) by mouth daily. 30 tablet 6  . Melatonin 10 MG CAPS Take 1 tablet by mouth at bedtime.     . metoprolol (LOPRESSOR) 50 MG tablet Take 0.5 tablets (25 mg total) by mouth 2 (two) times daily. Please call and schedule a one year follow up appointment 30 tablet 6  . pravastatin (PRAVACHOL) 80 MG tablet Take 80 mg by mouth daily.    . rivaroxaban (XARELTO) 20 MG TABS tablet Take 1 tablet (20 mg total) by mouth daily with supper. 30 tablet 6  . trimethoprim-polymyxin b (POLYTRIM) ophthalmic solution Place 1 drop into both eyes 3 times/day as needed-between meals & bedtime (moisture).     [name] VICTOZA 18 MG/3ML SOLN injection Once daily    . furosemide (LASIX) 20 MG tablet Take 1 tablet (20 mg total) by mouth daily. 30 tablet 6   No current facility-administered medications for this visit.     Allergies: Allergies  Allergen Reactions  . Lisinopril Swelling and Rash    Rash - face and tounge swell    Social History: The patient  reports that he has been smoking Cigars.  He has a 50.00 pack-year smoking history. He has never used smokeless tobacco. He reports that he drinks alcohol. He reports that he does not use drugs.   Family History: The patient's family history includes Cardiomyopathy in his mother; Coronary artery disease in his father; Diabetes in his father and sister; Heart attack in his father and sister; Heart disease in his father, mother, and sister; Hyperlipidemia in his father, mother, and sister; Stroke in his father.   Review of Systems: Please see the history of present illness.   Otherwise, the review of systems is positive for none.   All other systems are reviewed and negative.   Physical Exam: VS:  BP (!) 148/70   Pulse (!) 56   Ht 5\' 7"  (1.702 m)   Wt 195 lb 1.9 oz (88.5 kg)   BMI 30.56 kg/m  .  BMI Body mass index is 30.56 kg/m.  Wt Readings from Last 3 Encounters:  03/07/16 195 lb 1.9  oz (88.5 kg)  03/04/16 194 lb 6.4 oz (88.2 kg)  01/12/16 189 lb (85.7 kg)    General: Pleasant. Smells of tobacco. He is alert and in no acute distress.   HEENT: Normal.  Neck: Supple, no JVD, carotid bruits, or masses noted.  Cardiac: Fairly regular rate and rhythm today. No murmurs, rubs,  or gallops. No edema.  Respiratory:  Lungs are clear to auscultation bilaterally with normal work of breathing.  GI: Soft and nontender.  MS: No deformity or atrophy. Gait and ROM intact.  Skin: Warm and dry. Color is normal.  Neuro:  Strength and sensation are intact and no gross focal deficits noted.  Psych: Alert, appropriate and with normal affect.   LABORATORY DATA:  EKG:  EKG is ordered today. This demonstrates possible junctional rhythm - reviewed with Dr. Harrington Challenger today. His rate is now 56.   Lab Results  Component Value Date   WBC 7.8 07/19/2010   HGB 15.4 07/19/2010   HCT 45.2 07/19/2010   PLT 188 07/19/2010   GLUCOSE 112 (H) 07/28/2010   CHOL 149 11/07/2006   TRIG 101 11/07/2006   HDL 24.4 (L) 11/07/2006   LDLCALC 104 (H) 11/07/2006   ALT 19 07/20/2009   AST 19 07/20/2009   NA 140 07/19/2010   K 4.7 07/19/2010   CL 102 07/19/2010   CREATININE 1.20 09/10/2013   BUN 19 09/10/2013   CO2 27 07/19/2010   TSH 1.44 11/07/2006   INR 0.93 07/19/2010    BNP (last 3 results) No results for input(s): BNP in the last 8760 hours.  ProBNP (last 3 results) No results for input(s): PROBNP in the last 8760 hours.   Other Studies Reviewed Today:  Echo Study Conclusions from 01/2016  - Left ventricle: Wall thickness was increased in a pattern of moderate LVH. Systolic function was mildly to moderately reduced. The estimated ejection fraction was in the range of 40% to 45%. Akinesis of the mid-apicalanteroseptal myocardium. Features are consistent with a pseudonormal left ventricular filling pattern, with concomitant abnormal relaxation and increased filling pressure  (grade 2 diastolic dysfunction).  Notes Recorded by Burnell Blanks, MD on 01/27/2016 at 10:40 AM EDT Echo shows his LVEF is actually 40-45%. Stress test did not show ischemia. LVEF is better than echo in 2013. 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  There was no ST segment deviation noted during stress.  The left ventricular ejection fraction is severely decreased (<30%).  Findings consistent with prior myocardial infarction.  This is a high risk study.  1. EF 23%, diffuse hypokinesis.  2. Fixed large, severe basal inferior/inferoseptal, mid inferolateral/inferior/inferoseptal, and apical inferior/septal/lateral perfusion defect. This suggests prior MI without significant ischemia.   High risk study primarily due to low EF.     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 He has a known ischemic cardiomyopathy. The stress test shows no ischemia but LVEF looks worse. No echo since 2013. 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 Chris  Assessment/Plan: 1. ? New onset AF with slow VR - discussed with Dr. Meda Coffee (DOD) this past Friday - cut metoprolol back and placed on Xarelto. Reviewed with Dr. Harrington Challenger today - not clearcut - may just be junctional - she has suggested stopping Metoprolol altogether - see back on Wednesday with EKG. I am able to get Dr. Angelena Form to see him on Wednesday. She has advised continuing the aspirin and Xarelto for now.  He has had recent echo noted. No LAE noted. Further disposition to follow. May need EP to see.  2. CAD without angina: Stable. No chest pain. He is known to have CAD with inferior MI in in the early 90s and anterior MI in 2000 with recurrent inferior MI in 2006. Recent Myoview without ischemia  noted.   3. CARDIOMYOPATHY, ISCHEMIC: LVEF 40 to 45% by echo recently. I suspect some of his weakness/dizziness/dyspnea is because he has lost his AV synchrony. BNP was elevated. Will place him on low dose Lasix today.   4. PVD: Stable. Followed in [facility].  5. TOBACCO ABUSE: Smoking cessation recommended. He is smoking less - I suspect because he does not feel as well as he was. Now with abnormal CXR - this is worrisome.   6. HTN: BP fair - need to monitor.   7. Carotid artery disease: Mild disease by dopplers August 2016. Repeat 2018.    Current medicines are reviewed with the patient today.  The patient does not have concerns regarding medicines other than what has been noted above.  The following changes have been made:  See above.  Labs/ tests ordered today include:    Orders Placed This Encounter  Procedures  . EKG 12-Lead     Disposition:   FU with Dr. Angelena Form on Wednesday.    Patient is agreeable to this plan and will call if any problems develop in the interim.   Signed: Burtis Junes, RN, ANP-C 03/07/2016 12:21 PM  [facility] [address] [place], [place]  [address] Phone: [phone] Fax: [phone]

## 2016-03-07 NOTE — Telephone Encounter (Signed)
[place] called back from [facility], had request for labs and ov-they have not seen this patient before

## 2016-03-07 NOTE — Telephone Encounter (Signed)
This is the second message message I left for [facility]/Dr. Marcello Moores office to fax recent labs to our office.  Pt is coming in today.

## 2016-03-07 NOTE — Patient Instructions (Addendum)
We will be checking the following labs today - NONE   Medication Instructions:    Continue with your current medicines. BUT  I am stopping the Metoprolol altogether - do not take anymore  I am going to give you a fluid pill - Lasix 20 mg to take one each day - this should help with the shortness of breath and the cough    Testing/Procedures To Be Arranged:  Proceed with your chest CT tomorrow  Follow-Up:   See Dr. Angelena Form on Wednesday at 11:30 Am with a repeat EKG    Other Special Instructions:   Continue to monitor your BP and your heart rate for [place]    If you need a refill on your cardiac medications before your next appointment, please call your pharmacy.   Call the [place] office at [phone] if you have any questions, problems or concerns.

## 2016-03-09 ENCOUNTER — Encounter: Payer: Self-pay | Admitting: Cardiovascular Disease

## 2016-03-09 ENCOUNTER — Ambulatory Visit (INDEPENDENT_AMBULATORY_CARE_PROVIDER_SITE_OTHER): Payer: Medicare HMO | Admitting: Cardiovascular Disease

## 2016-03-09 VITALS — BP 140/62 | HR 81 | Ht 68.0 in | Wt 192.4 lb

## 2016-03-09 DIAGNOSIS — I739 Peripheral vascular disease, unspecified: Secondary | ICD-10-CM

## 2016-03-09 DIAGNOSIS — I2510 Atherosclerotic heart disease of native coronary artery without angina pectoris: Secondary | ICD-10-CM

## 2016-03-09 DIAGNOSIS — Z720 Tobacco use: Secondary | ICD-10-CM

## 2016-03-09 DIAGNOSIS — I480 Paroxysmal atrial fibrillation: Secondary | ICD-10-CM | POA: Diagnosis not present

## 2016-03-09 DIAGNOSIS — I255 Ischemic cardiomyopathy: Secondary | ICD-10-CM | POA: Diagnosis not present

## 2016-03-09 MED ORDER — RIVAROXABAN 20 MG PO TABS
20.0000 mg | ORAL_TABLET | Freq: Every day | ORAL | 6 refills | Status: DC
Start: 2016-03-09 — End: 2016-12-05

## 2016-03-09 MED ORDER — LOSARTAN POTASSIUM 100 MG PO TABS: 100.0000 mg | ORAL_TABLET | Freq: Every day | ORAL | 3 refills | Status: DC

## 2016-03-09 MED ORDER — FUROSEMIDE 20 MG PO TABS: 20.0000 mg | ORAL_TABLET | Freq: Every day | ORAL | 3 refills | Status: DC

## 2016-03-09 MED ORDER — PRAVASTATIN SODIUM 80 MG PO TABS: 80.0000 mg | ORAL_TABLET | Freq: Every day | ORAL | 3 refills | Status: DC

## 2016-03-09 NOTE — Progress Notes (Signed)
Chief Complaint  Patient presents with  . Coronary Artery Disease     History of Present Illness: 70 yo WM with history of CAD, ischemic cardiomyopathy, HTN, hyperlipidemia, ongoing tobacco abuse and known PAD who is here today for follow up. His PV issues are followed by Dr. Donnetta Hutching in [facility].  He has had bladder cancer and multiple surgeries on his bladder. Last echo October 2017 with LVEF=40-45% (previous 35-40%). Carotid artery dopplers April 2014 with mild bilateral disease. He is known to have CAD with inferior MI in 1992, PTCA of RCA at that time. He had an anterior MI in 2000 and had a stent placed in the LAD. In 2006, he had an inferior MI with Taxus stent placed in the RCA. In September 2006, repeat cath with occlusion of RCA in the stent. He has continued to smoke daily. 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 Echo as above with stable mild LV systolic dysfunction. Nuclear stress test with no ischemia. He was seen here in our office 03/04/16 by Truitt Merle, NP with c/o dizziness, cough, weakness and was found to be in atrial fibrillation with slow rate. Metoprolol was lowered to 25 mg po BID and Xarelto was started. He was seen back here on 03/07/16 by Truitt Merle, NP and his heart rate was low with possible junctional rhythm. Beta blocker stopped. Chest CT at [facility] 03/04/16 with left lung scarring without suspicious pulmonary nodules or masses.   He is here today for follow up. He has no chest pain or SOB, near syncope or syncope.    Primary Care Physician: Charletta Cousin, MD Sherlyn Lees, FNP-BC)  Past Medical History:  Diagnosis Date  . Bladder cancer ([place])    resection x3  . CHF (congestive heart failure) (HCC)    class II/III (systolic )  . Cigarette smoker   . Coronary artery disease    status post DMI RX Taxus stent RCA 2006 with susequent Stent LAD and subsequent  stent thrombosis RCA unable to be opened 2006 -neg mv 10/2008  . Diabetes  mellitus   . Hyperlipidemia   . Hypertension   . Ischemic cardiomyopathy    ejection fraction of 30%-35% by echo 8 /2010  . Lumbar spinal stenosis   . Myocardial infarction   . PVD (peripheral vascular disease) ([place])    status post bilateral aortobifemoral bypass in1992 with recent fem to fembypass April  2011 per DR. Early  . S/P lumbar microdiscectomy    12/31/08    Past Surgical History:  Procedure Laterality Date  . Aorto-bifemoral bypass graft surgery with right fem-fem bypass April 2011]  1992 then 2011  . APPENDECTOMY    . left shoulder surgery    . LUMBAR MICRODISCECTOMY  12/31/08  . taxus stent     placed into his right coronary artery; stent placed to the LAD.      Current Outpatient Prescriptions  Medication Sig Dispense Refill  . albuterol (PROVENTIL HFA;VENTOLIN HFA) 108 (90 BASE) MCG/ACT inhaler Inhale 2 puffs into the lungs as needed.      [name] aspirin 81 MG tablet Take 81 mg by mouth daily.      . cholecalciferol (VITAMIN D) 1000 UNITS tablet Take 1,000 Units by mouth daily.      . cyclobenzaprine (FLEXERIL) 10 MG tablet Take 10 mg by mouth as needed for muscle spasms (back pain).     . fluticasone (FLONASE) 50 MCG/ACT nasal spray Place 2  sprays into both nostrils daily.     . furosemide (LASIX) 20 MG tablet Take 1 tablet (20 mg total) by mouth daily. 90 tablet 3  . gabapentin (NEURONTIN) 400 MG capsule Take 400 mg by mouth 2 (two) times daily.    [name] HYDROcodone-acetaminophen (VICODIN) 5-500 MG per tablet Take 1 tablet by mouth every 6 (six) hours as needed for pain (back/hips).     . losartan (COZAAR) 100 MG tablet Take 1 tablet (100 mg total) by mouth daily. 90 tablet 3  . Melatonin 10 MG CAPS Take 1 tablet by mouth at bedtime.     . pravastatin (PRAVACHOL) 80 MG tablet Take 1 tablet (80 mg total) by mouth daily. 90 tablet 3  . rivaroxaban (XARELTO) 20 MG TABS tablet Take 1 tablet (20 mg total) by mouth daily with supper. 30 tablet 6  . trimethoprim-polymyxin b  (POLYTRIM) ophthalmic solution Place 1 drop into both eyes 3 times/day as needed-between meals & bedtime (moisture).     [name] VICTOZA 18 MG/3ML SOLN injection Once daily     No current facility-administered medications for this visit.     Allergies  Allergen Reactions  . Lisinopril Swelling and Rash    Rash - face and tounge swell    Social History   Social History  . Marital status: Married    Spouse name: N/A  . Number of children: 1  . Years of education: N/A   Occupational History  . retired Retired    Administrator   Social History Main Topics  . Smoking status: Current Every Day Smoker    Packs/day: 1.00    Years: 50.00    Types: Cigars  . Smokeless tobacco: Never Used  . Alcohol use Yes     Comment: occ  . Drug use: No  . Sexual activity: Not on file   Other Topics Concern  . Not on file   Social History Narrative   Lives with girlfriend in [place].      Family History  Problem Relation Age of Onset  . Coronary artery disease    . Diabetes    . Cardiomyopathy Mother   . Heart disease Mother   . Hyperlipidemia Mother   . Coronary artery disease Father   . Stroke Father   . Diabetes Father   . Heart disease Father     before age 51  . Hyperlipidemia Father   . Heart attack Father   . Diabetes Sister   . Heart disease Sister     before age 44  . Hyperlipidemia Sister   . Heart attack Sister     Review of Systems:  As stated in the HPI and otherwise negative.   BP 140/62   Pulse 81   Ht 5\' 8"  (1.727 m)   Wt 192 lb 6.4 oz (87.3 kg)   BMI 29.25 kg/m   Physical Examination: General: Well developed, well nourished, NAD  HEENT: OP clear, mucus membranes moist  SKIN: warm, dry. No rashes. Neuro: No focal deficits  Musculoskeletal: Muscle strength 5/5 all ext  Psychiatric: Mood and affect normal  Neck: No JVD, right  carotid bruit, no thyromegaly, no lymphadenopathy.  Lungs:Clear bilaterally, no wheezes, rhonci, crackles Cardiovascular: Tachy.  Regular with ectopy. No murmurs, gallops or rubs. Abdomen:Soft. Bowel sounds present. Non-tender.  Extremities: No lower extremity edema. Pulses are 1 + in the bilateral DP/PT.  Echo October 2017: Left ventricle: Wall thickness was increased in a pattern of   moderate LVH. Systolic  function was mildly to moderately reduced.   The estimated ejection fraction was in the range of 40% to 45%.   Akinesis of the mid-apicalanteroseptal myocardium. Features are   consistent with a pseudonormal left ventricular filling pattern,   with concomitant abnormal relaxation and increased filling   pressure (grade 2 diastolic dysfunction).  EKG:  EKG is ordered today. The ekg ordered today demonstrates Sinus rhythm with PACs, rate 79 bpm.   Recent Labs: No results found for requested labs within last 8760 hours.   Lipid Panel Followed in primary care   Wt Readings from Last 3 Encounters:  03/09/16 192 lb 6.4 oz (87.3 kg)  03/07/16 195 lb 1.9 oz (88.5 kg)  03/04/16 194 lb 6.4 oz (88.2 kg)     Other studies Reviewed: Additional studies/ records that were reviewed today include: . Review of the above records demonstrates:    Assessment and Plan:   1. CAD without angina: He has no chest pain suggestive of angina. He is known to have CAD with inferior MI in in the early 90s and anterior MI in 2000 with recurrent inferior MI in 2006. Nuclear stress test 2017 with no ischemia. Continue medical management with ASA, statin. Beta blocker stopped due to bradycardia.   2. CARDIOMYOPATHY, ISCHEMIC: LVEF 40-45% in October 2017. Continue medical therapy with ARB. NO beta blocker with bradycardia.   3. PAD: Stable. Followed in [facility].  4. Atrial fibrillation, paroxysmal: He appears to be in sinus today with PACS now that beta blocker has been held. Will not restart metoprolol. He is anti-coagulated on Xarelto. Rate is ok today. If he has recurrence of bradycardia or if we have trouble going forward with rapid  ventricular response, will need EP referral.   5. TOBACCO ABUSE: Smoking cessation recommended. I spent ten minutes on counseling today. He is trying to stop.   6. HTN: BP controlled. No changes.   7. Carotid artery disease: Mild disease by dopplers August 2016. Repeat 2018.    Current medicines are reviewed at length with the patient today.  The patient does not have concerns regarding medicines.  The following changes have been made:  no change  Labs/ tests ordered today include:   Orders Placed This Encounter  Procedures  . EKG 12-Lead    Disposition:   FU with me in 12  months  Signed, Lauree Chandler, MD 03/09/2016 12:45 PM    [facility] [place], [place], [place]  [address] Phone: [phone]; Fax: [phone]

## 2016-03-09 NOTE — Patient Instructions (Signed)
Medication Instructions:  Your physician recommends that you continue on your current medications as directed. Please refer to the Current Medication list given to you today.   Labwork: none  Testing/Procedures: none  Follow-Up:  Your physician recommends that you schedule a follow-up appointment in: 3-4 months  Any Other Special Instructions Will Be Listed Below (If Applicable).  If you need a refill on your cardiac medications before your next appointment, please call your pharmacy.  

## 2016-03-28 ENCOUNTER — Ambulatory Visit: Payer: Medicare HMO | Admitting: Cardiovascular Disease

## 2016-04-14 ENCOUNTER — Telehealth: Payer: Self-pay | Admitting: Cardiovascular Disease

## 2016-04-14 NOTE — Telephone Encounter (Signed)
Patient has been scheduled 12/29 with Rosaria Ferries, PA.

## 2016-04-14 NOTE — Telephone Encounter (Signed)
Routing to DOD doctor and his nurse - they are addressing need for pt

## 2016-04-14 NOTE — Telephone Encounter (Signed)
Pt at office in [place] had EKG -abnoramal -faxing to me-has dizziness and some on and off chest pain

## 2016-04-15 ENCOUNTER — Ambulatory Visit (INDEPENDENT_AMBULATORY_CARE_PROVIDER_SITE_OTHER): Payer: Medicare HMO | Admitting: Physician Assistant

## 2016-04-15 ENCOUNTER — Ambulatory Visit (INDEPENDENT_AMBULATORY_CARE_PROVIDER_SITE_OTHER): Payer: Medicare HMO

## 2016-04-15 ENCOUNTER — Encounter: Payer: Self-pay | Admitting: Physician Assistant

## 2016-04-15 VITALS — BP 148/84 | HR 93 | Ht 68.0 in | Wt 183.6 lb

## 2016-04-15 DIAGNOSIS — I2510 Atherosclerotic heart disease of native coronary artery without angina pectoris: Secondary | ICD-10-CM

## 2016-04-15 DIAGNOSIS — I5022 Chronic systolic (congestive) heart failure: Secondary | ICD-10-CM | POA: Diagnosis not present

## 2016-04-15 DIAGNOSIS — R55 Syncope and collapse: Secondary | ICD-10-CM

## 2016-04-15 NOTE — Progress Notes (Signed)
Cardiology Office Note   Date:  04/15/2016   ID:  [name], DOB June 01, 1945, MRN [id number]  PCP:  Charletta Cousin, MD  Cardiologist:  Dr Angelena Form, 03/09/2016  Rosaria Ferries, PA-C   Chief Complaint  Patient presents with  . Chest Pain  . Dizziness    History of Present Illness: [name] is a 70 y.o. male with a history of S-CHF, ICM w/ EF 40-45% echo 01/2016, HTN, HLD, PAD, PTCA RCA 1992 MI, DES LAD 2000, IMI 2006 w/ DES RCA (occluded by later cath), bladder CA, ongoing tobacco use, MV 12/2015 w/ no ischemia. Afib dx 03/04/2016>rate slow and BB decreased and later d/c'd, Xarelto started.   12/28 pt scheduled for appt because ECG at MD office in [place] was abnl, pt dizzy, +CP  [name] presents for cardiology evaluation  He has been having dizzy spells for about 2 weeks. When he is up and around, walking, bending over, standing still, or turning, he may become very dizzy. If he sits down and rests, it will get better. He cannot do anything for more than 10-15 minutes. This is the same dizziness he was having in November when his HR was low, but it is more severe and lasts longer. He has not completely loss consciousness. The symptoms are happening pretty consistently with exertion, but not every time. He is not had chest pain. He has not had shortness of breath. He has not had palpitations or felt that his heart was skipping or racing.  He occasionally has sharp L chest pain. It goes away in a short time. It does not feel like his angina.   He has back problems, has trouble doing many things, but is able to be active, within his limitations. He has not had any new dyspnea on exertion. He has not had orthopnea or PND. His weight has not increased.   Past Medical History:  Diagnosis Date  . Bladder cancer ([place])    resection x3  . CHF (congestive heart failure) (HCC)    class II/III (systolic )  . Cigarette smoker   . Coronary artery disease    status post DMI RX  Taxus stent RCA 2006 with susequent Stent LAD and subsequent  stent thrombosis RCA unable to be opened 2006 -neg mv 10/2008  . Diabetes mellitus   . Hyperlipidemia   . Hypertension   . Ischemic cardiomyopathy    ejection fraction of 30%-35% by echo 8 /2010  . Lumbar spinal stenosis   . Myocardial infarction   . PVD (peripheral vascular disease) ([place])    status post bilateral aortobifemoral bypass in1992 with recent fem to fembypass April  2011 per DR. Early  . S/P lumbar microdiscectomy    12/31/08    Past Surgical History:  Procedure Laterality Date  . Aorto-bifemoral bypass graft surgery with right fem-fem bypass April 2011]  1992 then 2011  . APPENDECTOMY    . left shoulder surgery    . LUMBAR MICRODISCECTOMY  12/31/08  . taxus stent     placed into his right coronary artery; stent placed to the LAD.      Current Outpatient Prescriptions  Medication Sig Dispense Refill  . albuterol (PROVENTIL HFA;VENTOLIN HFA) 108 (90 BASE) MCG/ACT inhaler Inhale 2 puffs into the lungs as needed.      [name] aspirin 81 MG tablet Take 81 mg by mouth daily.      . cholecalciferol (VITAMIN D) 1000 UNITS tablet Take 1,000 Units by mouth  daily.      . cyclobenzaprine (FLEXERIL) 10 MG tablet Take 10 mg by mouth as needed for muscle spasms (back pain).     . fluticasone (FLONASE) 50 MCG/ACT nasal spray Place 2 sprays into both nostrils daily.     . furosemide (LASIX) 20 MG tablet Take 1 tablet (20 mg total) by mouth daily. 90 tablet 3  . gabapentin (NEURONTIN) 400 MG capsule Take 400 mg by mouth 2 (two) times daily.    [name] HYDROcodone-acetaminophen (VICODIN) 5-500 MG per tablet Take 1 tablet by mouth every 6 (six) hours as needed for pain (back/hips).     . losartan (COZAAR) 100 MG tablet Take 1 tablet (100 mg total) by mouth daily. 90 tablet 3  . Melatonin 10 MG CAPS Take 1 tablet by mouth at bedtime.     . pravastatin (PRAVACHOL) 80 MG tablet Take 1 tablet (80 mg total) by mouth daily. 90 tablet 3  .  rivaroxaban (XARELTO) 20 MG TABS tablet Take 1 tablet (20 mg total) by mouth daily with supper. 30 tablet 6  . trimethoprim-polymyxin b (POLYTRIM) ophthalmic solution Place 1 drop into both eyes 3 times/day as needed-between meals & bedtime (moisture).     [name] VICTOZA 18 MG/3ML SOLN injection Once daily     No current facility-administered medications for this visit.     Allergies:   Lisinopril    Social History:  The patient  reports that he has been smoking Cigars.  He has a 50.00 pack-year smoking history. He has never used smokeless tobacco. He reports that he drinks alcohol. He reports that he does not use drugs.   Family History:  The patient's family history includes Cardiomyopathy in his mother; Coronary artery disease in his father; Diabetes in his father and sister; Heart attack in his father and sister; Heart disease in his father, mother, and sister; Hyperlipidemia in his father, mother, and sister; Stroke in his father.    ROS:  Please see the history of present illness. All other systems are reviewed and negative.    PHYSICAL EXAM: VS:  BP (!) 148/84   Pulse 93   Ht 5\' 8"  (1.727 m)   Wt 183 lb 9.6 oz (83.3 kg)   BMI 27.92 kg/m  , BMI Body mass index is 27.92 kg/m. GEN: Well nourished, well developed, male in no acute distress  HEENT: normal for age  Neck: no JVD, no carotid bruit, no masses Cardiac: RRR; no murmur, no rubs, or gallops Respiratory:  clear to auscultation bilaterally, normal work of breathing GI: soft, nontender, nondistended, + BS MS: no deformity or atrophy; no edema; distal pulses are 2+ in all 4 extremities   Skin: warm and dry, no rash Neuro:  Strength and sensation are intact Psych: euthymic mood, full affect   EKG:  EKG is ordered today. The ekg ordered today demonstrates sinus rhythm, heart rate 93  ECHO: 01/26/2016 - Left ventricle: Wall thickness was increased in a pattern of   moderate LVH. Systolic function was mildly to moderately  reduced.   The estimated ejection fraction was in the range of 40% to 45%.   Akinesis of the mid-apicalanteroseptal myocardium. Features are   consistent with a pseudonormal left ventricular filling pattern,   with concomitant abnormal relaxation and increased filling   pressure (grade 2 diastolic dysfunction).  MYOVIEW: 01/12/2016  Nuclear stress EF: 23%.  There was no ST segment deviation noted during stress.  The left ventricular ejection fraction is severely decreased (<30%).  Findings consistent with prior myocardial infarction.  This is a high risk study.  1. EF 23%, diffuse hypokinesis.  2. Fixed large, severe basal inferior/inferoseptal, mid inferolateral/inferior/inferoseptal, and apical inferior/septal/lateral perfusion defect.  This suggests prior MI without significant ischemia.  High risk study primarily due to low EF.    Recent Labs: No results found for requested labs within last 8760 hours.    Lipid Panel    Component Value Date/Time   CHOL 149 11/07/2006 0958   TRIG 101 11/07/2006 0958   HDL 24.4 (L) 11/07/2006 0958   CHOLHDL 6.1 CALC 11/07/2006 0958   VLDL 20 11/07/2006 0958   LDLCALC 104 (H) 11/07/2006 0958     Wt Readings from Last 3 Encounters:  04/15/16 183 lb 9.6 oz (83.3 kg)  03/09/16 192 lb 6.4 oz (87.3 kg)  03/07/16 195 lb 1.9 oz (88.5 kg)     Other studies Reviewed: Additional studies/ records that were reviewed today include: Office notes, hospital records and testing.  ASSESSMENT AND PLAN:  1.  Near syncope: He has had atrial fibrillation with slow ventricular response in the past. However, on the ECG done at this doctor's office yesterday, he is in sinus rhythm but with frequent PVCs in a trigeminy pattern. With his low EF, he is sent risk for nonsustained VT.  We need to document the arrhythmia associated with his symptoms to determine appropriate treatment. He will wear an event monitor for 2 weeks. He feels confident as does his  wife that he will have symptoms within the 2 weeks and this will help his great deal in determining his treatment.  2. Chronic systolic CHF: He is working very hard eating healthy. He is compliant with his medications. His blood pressure is up a little bit today, but he states it runs better at home. His weight is down from previous values. He does not have significant volume overload by exam. Continue current therapy.   3. CAD: He was not taking aspirin because he didn't realize he was supposed to be taking it. He is on losartan, and Pravachol. He is not on a beta blocker because of his history of bradycardia. Continue current therapy, restart aspirin because Dr. Camillia Herter last note lists it. He is having no ischemic symptoms and no additional testing is needed at this time.   Current medicines are reviewed at length with the patient today.  The patient does not have concerns regarding medicines.  The following changes have been made:  no change  Labs/ tests ordered today include:   Orders Placed This Encounter  Procedures  . Cardiac event monitor  . EKG 12-Lead     Disposition:   FU with Dr. Angelena Form  Signed, Lenoard Aden  04/15/2016 5:39 PM    [place] Phone: [phone]; Fax: [phone]  This note was written with the assistance of speech recognition software. Please excuse any transcriptional errors.

## 2016-04-15 NOTE — Patient Instructions (Signed)
Medication Instructions:  START ASPIRIN 81MG -BABY ASPIRIN   If you need a refill on your cardiac medications before your next appointment, please call your pharmacy.   Testing/Procedures: Your physician has recommended that you wear an event monitor. Event monitors are medical devices that record the heart's electrical activity. Doctors most often [place] these monitors to diagnose arrhythmias. Arrhythmias are problems with the speed or rhythm of the heartbeat. The monitor is a small, portable device. You can wear one while you do your normal daily activities. This is usually used to diagnose what is causing palpitations/syncope (passing out).  Follow-Up: Your physician recommends that you schedule a follow-up appointment in: AFTER 2 [place] (PER RHONDA)   [place]!!     Thank you for choosing [facility] at [employer], LPN RHONDA BARRETT, PA-C

## 2016-04-19 ENCOUNTER — Telehealth: Payer: Self-pay | Admitting: Physician Assistant

## 2016-04-19 ENCOUNTER — Telehealth: Payer: Self-pay

## 2016-04-19 MED ORDER — CARVEDILOL 6.25 MG PO TABS: 6.2500 mg | ORAL_TABLET | Freq: Two times a day (BID) | ORAL | 3 refills | Status: DC

## 2016-04-19 NOTE — Telephone Encounter (Signed)
Received two reports from [employer].   First was on 04/15/16 at 4:15 pm (CT)  shortly after patient's visit with Rosaria Ferries PA. Monitor showed Sinus Tachycardia w/ Run of V-tach (3/4/5 beats)/Couplet PVCs/PVCs (9)/PACs/Artifact  Second was on 04/16/16 at 3:14 pm (CT). Monitor showed Sinus Arrhythmia, Sinus Tachycardia w/Run of V-Tach (5 in 1 min)/Artifact  Patient was asymptomatic at time of the two events. Patient stated the only thing he remembers about those two days was a slight headache.  Consulted Dr. Curt Bears (DOD), he recommend patient start Coreg 6.25 mg BID. Patient has a follow-up appointment with Truitt Merle NP on 05/11/16. Called patient with medication recommendation. Patient verbalized understanding and will call with any questions or concerns. Will forward to Dr. Angelena Form and his nurse so they are aware.

## 2016-04-19 NOTE — Telephone Encounter (Signed)
Agree. Thanks

## 2016-04-27 ENCOUNTER — Telehealth: Payer: Self-pay | Admitting: *Deleted

## 2016-04-27 ENCOUNTER — Ambulatory Visit (INDEPENDENT_AMBULATORY_CARE_PROVIDER_SITE_OTHER): Payer: PPO | Admitting: Internal Medicine

## 2016-04-27 ENCOUNTER — Encounter: Payer: Self-pay | Admitting: Internal Medicine

## 2016-04-27 ENCOUNTER — Encounter: Payer: Self-pay | Admitting: *Deleted

## 2016-04-27 VITALS — BP 130/62 | HR 92 | Ht 68.0 in | Wt 187.0 lb

## 2016-04-27 DIAGNOSIS — I255 Ischemic cardiomyopathy: Secondary | ICD-10-CM | POA: Diagnosis not present

## 2016-04-27 DIAGNOSIS — I480 Paroxysmal atrial fibrillation: Secondary | ICD-10-CM | POA: Diagnosis not present

## 2016-04-27 DIAGNOSIS — R55 Syncope and collapse: Secondary | ICD-10-CM | POA: Diagnosis not present

## 2016-04-27 DIAGNOSIS — I5022 Chronic systolic (congestive) heart failure: Secondary | ICD-10-CM

## 2016-04-27 MED ORDER — CARVEDILOL 12.5 MG PO TABS: 12.5000 mg | ORAL_TABLET | Freq: Two times a day (BID) | ORAL | 3 refills | Status: DC

## 2016-04-27 MED ORDER — FUROSEMIDE 20 MG PO TABS: 20.0000 mg | ORAL_TABLET | Freq: Every day | ORAL | 3 refills | Status: DC | PRN

## 2016-04-27 NOTE — Progress Notes (Signed)
Patient ID: [name], male   DOB: 03-22-1946, 71 y.o.   MRN: [id number]  Evelena Asa from [employer] contacted to extend patients current Cardiac Event Monitor from 14 days to 30 days. His new End of Service date will be 05/15/16.

## 2016-04-27 NOTE — Patient Instructions (Addendum)
Medication Instructions:  Your physician has recommended you make the following change in your medication:  1) Increase Carvedilol to 12.5 mg twice daily 2) Only take Furosemide as needed    Labwork: None ordered   Testing/Procedures:  Will have monitor extended for 2 more weeks  Follow-Up: Your physician recommends that you schedule a follow-up appointment in: 4 weeks with Dr Rayann Heman  Any Other Special Instructions Will Be Listed Below (If Applicable).     If you need a refill on your cardiac medications before your next appointment, please call your pharmacy.  NSVT

## 2016-04-27 NOTE — Telephone Encounter (Signed)
Received monitor readings from evening of 04/26/16.  Reviewed with Dr. Burt Knack and monitor shows short runs of NSVT. EP evaluation recommended.  I spoke with pt's wife who reports they received call from monitor company last night and she reports pt was tired at that time. No other symptoms.  I scheduled pt to see Dr. Rayann Heman this afternoon at 3:15.

## 2016-04-27 NOTE — Progress Notes (Signed)
Electrophysiology Office Note   Date:  04/27/2016   ID:  [name], DOB 1946-04-06, MRN [id number]  PCP:  Charletta Cousin, MD  Cardiologist:  Dr Angelena Form Primary Electrophysiologist: Thompson Grayer, MD    CC: dizziness   History of Present Illness: [name] is a 71 y.o. male who presents today for electrophysiology evaluation.   The patient has occasional spells of "dizziness".  These havew been present for several weeks.  He finds that these are mostly postural and typically while standing.  He has been evaluated by cardiology and noted to have NSVT on holter monitoring.  He has not had shortness of breath. He has not had palpitations or felt that his heart was skipping or racing.  Today, he denies symptoms of exertional chest pain,, lower extremity edema, claudication, dizziness, presyncope, syncope, bleeding, or neurologic sequela. The patient is tolerating medications without difficulties and is otherwise without complaint today.    Past Medical History:  Diagnosis Date  . Bladder cancer ([place])    resection x3  . CHF (congestive heart failure) (HCC)    class II/III (systolic )  . Cigarette smoker   . Coronary artery disease    status post DMI RX Taxus stent RCA 2006 with susequent Stent LAD and subsequent  stent thrombosis RCA unable to be opened 2006 -neg mv 10/2008  . Diabetes mellitus   . Hyperlipidemia   . Hypertension   . Ischemic cardiomyopathy    ejection fraction of 40-45%  . Lumbar spinal stenosis   . Myocardial infarction   . NSVT (nonsustained ventricular tachycardia) ([place])   . Paroxysmal atrial fibrillation (HCC)   . PVD (peripheral vascular disease) ([place])    status post bilateral aortobifemoral bypass in1992 with recent fem to fembypass April  2011 per DR. Early  . S/P lumbar microdiscectomy    12/31/08   Past Surgical History:  Procedure Laterality Date  . Aorto-bifemoral bypass graft surgery with right fem-fem bypass April 2011]  1992 then 2011  .  APPENDECTOMY    . left shoulder surgery    . LUMBAR MICRODISCECTOMY  12/31/08  . taxus stent     placed into his right coronary artery; stent placed to the LAD.       Current Outpatient Prescriptions  Medication Sig Dispense Refill  . albuterol (PROVENTIL HFA;VENTOLIN HFA) 108 (90 BASE) MCG/ACT inhaler Inhale 2 puffs into the lungs as needed.      [name] aspirin 81 MG tablet Take 81 mg by mouth daily.      . carvedilol (COREG) 12.5 MG tablet Take 1 tablet (12.5 mg total) by mouth 2 (two) times daily. 180 tablet 3  . cholecalciferol (VITAMIN D) 1000 UNITS tablet Take 1,000 Units by mouth daily.      . cyclobenzaprine (FLEXERIL) 10 MG tablet Take 10 mg by mouth as needed for muscle spasms (back pain).     . fluticasone (FLONASE) 50 MCG/ACT nasal spray Place 2 sprays into both nostrils daily.     . furosemide (LASIX) 20 MG tablet Take 1 tablet (20 mg total) by mouth daily as needed for fluid. 90 tablet 3  . gabapentin (NEURONTIN) 400 MG capsule Take 400 mg by mouth 2 (two) times daily.    [name] HYDROcodone-acetaminophen (VICODIN) 5-500 MG per tablet Take 1 tablet by mouth every 6 (six) hours as needed for pain (back/hips).     . losartan (COZAAR) 100 MG tablet Take 1 tablet (100 mg total) by mouth daily. 90 tablet 3  .  Melatonin 10 MG CAPS Take 1 tablet by mouth at bedtime.     . pravastatin (PRAVACHOL) 80 MG tablet Take 1 tablet (80 mg total) by mouth daily. 90 tablet 3  . rivaroxaban (XARELTO) 20 MG TABS tablet Take 1 tablet (20 mg total) by mouth daily with supper. 30 tablet 6  . trimethoprim-polymyxin b (POLYTRIM) ophthalmic solution Place 1 drop into both eyes 3 times/day as needed-between meals & bedtime (moisture).     [name] VICTOZA 18 MG/3ML SOLN injection Once daily     No current facility-administered medications for this visit.     Allergies:   Lisinopril   Social History:  The patient  reports that he has been smoking Cigars.  He has a 50.00 pack-year smoking history. He has never used  smokeless tobacco. He reports that he drinks alcohol. He reports that he does not use drugs.   Family History:  The patient's  family history includes Cardiomyopathy in his mother; Coronary artery disease in his father; Diabetes in his father and sister; Heart attack in his father and sister; Heart disease in his father, mother, and sister; Hyperlipidemia in his father, mother, and sister; Stroke in his father.    ROS:  Please see the history of present illness.   All other systems are reviewed and negative.    PHYSICAL EXAM: VS:  BP 130/62   Pulse 92   Ht 5\' 8"  (1.727 m)   Wt 187 lb (84.8 kg)   BMI 28.43 kg/m  , BMI Body mass index is 28.43 kg/m. GEN: Well nourished, well developed, in no acute distress  HEENT: dry MM Neck: no JVD, carotid bruits, or masses Cardiac: RRR; no murmurs, rubs, or gallops,no edema  Respiratory:  clear to auscultation bilaterally, normal work of breathing GI: soft, nontender, nondistended, + BS MS: no deformity or atrophy  Skin: warm and dry  Neuro:  Strength and sensation are intact Psych: euthymic mood, full affect  EKG:  EKG from 04/15/16 is reviewed  Lipid Panel     Component Value Date/Time   CHOL 149 11/07/2006 0958   TRIG 101 11/07/2006 0958   HDL 24.4 (L) 11/07/2006 0958   CHOLHDL 6.1 CALC 11/07/2006 0958   VLDL 20 11/07/2006 0958   LDLCALC 104 (H) 11/07/2006 0958     Wt Readings from Last 3 Encounters:  04/27/16 187 lb (84.8 kg)  04/15/16 183 lb 9.6 oz (83.3 kg)  03/09/16 192 lb 6.4 oz (87.3 kg)      Other studies Reviewed: Additional studies/ records that were reviewed today include: Dr Angelena Form and APP notes are reviewed,  Event monitor is reviewed  Review of the above records today demonstrates: NSVT episodes,  Nocturnal AV block also noted   ASSESSMENT AND PLAN:  1.  NSVT I am not convinced that this is the cause for his symptoms. Increase coreg to 12.5mg  BID Will extend monitor for an additional 2 weeks to better  characterize  2. Postural dizziness Reduce lasix to prn as he appears dry on exam Will extend monitor for an additional 2 weeks to look for arrhythmia as the cause for his dizziness. He is orthostatic by BP today.  Follow-up:  Return to see me in 4 weeks  Current medicines are reviewed at length with the patient today.   The patient does not have concerns regarding his medicines.  The following changes were made today:  none   Signed, Thompson Grayer, MD  04/27/2016 5:47 PM     [facility]  [address] [place] [address] [phone] (office) [phone] (fax)

## 2016-05-03 ENCOUNTER — Telehealth: Payer: Self-pay | Admitting: Internal Medicine

## 2016-05-03 NOTE — Telephone Encounter (Signed)
Report from [employer] faxed over with report analysis: Sinus Rhythm, Ventricular Tachycardia (8 Beat) w/ MF PVCs (2). Spoke with patient's wife (DPR on file). She states that her husband said that he did not have any symptoms during the episode that he was just sitting talking to a friend. DOD Dr. Lovena Le reviewed the report. No interventions at this time.

## 2016-05-09 ENCOUNTER — Telehealth: Payer: Self-pay

## 2016-05-09 NOTE — Telephone Encounter (Signed)
[employer] faxed monitor of patient's rhythm on Saturday at midnight- report stated Ventricular Tachycardia (5 beats), Sinus Rhythm w PVCs (1). Spoke with wife (DPR), she reported that patient was sleeping at that time. Patient' wife did reported that patient has had a decrease in appetite since started the increase dose of carvedilol and had a dizzy spell yesterday. Consulted Dr. Rayann Heman, recommend to keep monitoring, and he does not think patient's appetite and increase in carvedilol is related. Called patient wife and made her aware of recommendations. Patient's wife verbalized understanding and will call with any other questions or concerns.

## 2016-05-11 ENCOUNTER — Ambulatory Visit: Payer: Medicare HMO | Admitting: Nurse Practitioner

## 2016-05-11 DIAGNOSIS — I1310 Hypertensive heart and chronic kidney disease without heart failure, with stage 1 through stage 4 chronic kidney disease, or unspecified chronic kidney disease: Secondary | ICD-10-CM | POA: Diagnosis not present

## 2016-05-11 DIAGNOSIS — E1122 Type 2 diabetes mellitus with diabetic chronic kidney disease: Secondary | ICD-10-CM | POA: Diagnosis not present

## 2016-05-11 DIAGNOSIS — Z1211 Encounter for screening for malignant neoplasm of colon: Secondary | ICD-10-CM | POA: Diagnosis not present

## 2016-05-11 DIAGNOSIS — E1151 Type 2 diabetes mellitus with diabetic peripheral angiopathy without gangrene: Secondary | ICD-10-CM | POA: Diagnosis not present

## 2016-05-11 DIAGNOSIS — E1165 Type 2 diabetes mellitus with hyperglycemia: Secondary | ICD-10-CM | POA: Diagnosis not present

## 2016-05-11 DIAGNOSIS — Z79899 Other long term (current) drug therapy: Secondary | ICD-10-CM | POA: Diagnosis not present

## 2016-05-11 DIAGNOSIS — Z125 Encounter for screening for malignant neoplasm of prostate: Secondary | ICD-10-CM | POA: Diagnosis not present

## 2016-05-11 DIAGNOSIS — Z0000 Encounter for general adult medical examination without abnormal findings: Secondary | ICD-10-CM | POA: Diagnosis not present

## 2016-05-11 DIAGNOSIS — E785 Hyperlipidemia, unspecified: Secondary | ICD-10-CM | POA: Diagnosis not present

## 2016-05-11 DIAGNOSIS — Z1212 Encounter for screening for malignant neoplasm of rectum: Secondary | ICD-10-CM | POA: Diagnosis not present

## 2016-05-11 DIAGNOSIS — Z1389 Encounter for screening for other disorder: Secondary | ICD-10-CM | POA: Diagnosis not present

## 2016-05-11 DIAGNOSIS — N182 Chronic kidney disease, stage 2 (mild): Secondary | ICD-10-CM | POA: Diagnosis not present

## 2016-05-16 DIAGNOSIS — E291 Testicular hypofunction: Secondary | ICD-10-CM | POA: Diagnosis not present

## 2016-05-16 DIAGNOSIS — E559 Vitamin D deficiency, unspecified: Secondary | ICD-10-CM | POA: Diagnosis not present

## 2016-05-16 DIAGNOSIS — E1151 Type 2 diabetes mellitus with diabetic peripheral angiopathy without gangrene: Secondary | ICD-10-CM | POA: Diagnosis not present

## 2016-05-16 DIAGNOSIS — I1310 Hypertensive heart and chronic kidney disease without heart failure, with stage 1 through stage 4 chronic kidney disease, or unspecified chronic kidney disease: Secondary | ICD-10-CM | POA: Diagnosis not present

## 2016-05-16 DIAGNOSIS — N401 Enlarged prostate with lower urinary tract symptoms: Secondary | ICD-10-CM | POA: Diagnosis not present

## 2016-05-16 DIAGNOSIS — N182 Chronic kidney disease, stage 2 (mild): Secondary | ICD-10-CM | POA: Diagnosis not present

## 2016-05-16 DIAGNOSIS — J449 Chronic obstructive pulmonary disease, unspecified: Secondary | ICD-10-CM | POA: Diagnosis not present

## 2016-05-16 DIAGNOSIS — E1122 Type 2 diabetes mellitus with diabetic chronic kidney disease: Secondary | ICD-10-CM | POA: Diagnosis not present

## 2016-05-16 DIAGNOSIS — E1165 Type 2 diabetes mellitus with hyperglycemia: Secondary | ICD-10-CM | POA: Diagnosis not present

## 2016-05-16 DIAGNOSIS — E785 Hyperlipidemia, unspecified: Secondary | ICD-10-CM | POA: Diagnosis not present

## 2016-05-16 DIAGNOSIS — Z79899 Other long term (current) drug therapy: Secondary | ICD-10-CM | POA: Diagnosis not present

## 2016-05-26 DIAGNOSIS — E7800 Pure hypercholesterolemia, unspecified: Secondary | ICD-10-CM | POA: Diagnosis not present

## 2016-05-26 DIAGNOSIS — E1122 Type 2 diabetes mellitus with diabetic chronic kidney disease: Secondary | ICD-10-CM | POA: Diagnosis not present

## 2016-05-26 DIAGNOSIS — N182 Chronic kidney disease, stage 2 (mild): Secondary | ICD-10-CM | POA: Diagnosis not present

## 2016-05-26 DIAGNOSIS — Z136 Encounter for screening for cardiovascular disorders: Secondary | ICD-10-CM | POA: Diagnosis not present

## 2016-05-26 DIAGNOSIS — I1310 Hypertensive heart and chronic kidney disease without heart failure, with stage 1 through stage 4 chronic kidney disease, or unspecified chronic kidney disease: Secondary | ICD-10-CM | POA: Diagnosis not present

## 2016-05-27 ENCOUNTER — Encounter: Payer: Self-pay | Admitting: Internal Medicine

## 2016-05-27 ENCOUNTER — Ambulatory Visit (INDEPENDENT_AMBULATORY_CARE_PROVIDER_SITE_OTHER): Payer: PPO | Admitting: Internal Medicine

## 2016-05-27 VITALS — BP 138/76 | HR 73 | Ht 68.0 in | Wt 182.0 lb

## 2016-05-27 DIAGNOSIS — R42 Dizziness and giddiness: Secondary | ICD-10-CM

## 2016-05-27 DIAGNOSIS — I4729 Other ventricular tachycardia: Secondary | ICD-10-CM

## 2016-05-27 DIAGNOSIS — I472 Ventricular tachycardia: Secondary | ICD-10-CM

## 2016-05-27 DIAGNOSIS — I480 Paroxysmal atrial fibrillation: Secondary | ICD-10-CM

## 2016-05-27 NOTE — Patient Instructions (Addendum)
Medication Instructions:  Your physician recommends that you continue on your current medications as directed. Please refer to the Current Medication list given to you today.  Labwork: None ordered.  Testing/Procedures: None ordered.  Follow-Up: Your physician recommends that you schedule a follow-up appointment as needed.   Any Other Special Instructions Will Be Listed Below (If Applicable).     If you need a refill on your cardiac medications before your next appointment, please call your pharmacy.   

## 2016-05-27 NOTE — Progress Notes (Signed)
Electrophysiology Office Note   Date:  05/27/2016   ID:  [name], DOB 06/17/1945, MRN [id number]  PCP:  Charletta Cousin, MD  Cardiologist:  Dr Angelena Form Primary Electrophysiologist: Thompson Grayer, MD    CC: dizziness   History of Present Illness: [name] is a 71 y.o. male who presents today for electrophysiology follow-up.    He is doing "better".  His postural dizziness resolved with stopping lasix.  He has had no fluttering or palpitations. Today, he denies symptoms of exertional chest pain,, lower extremity edema, claudication, dizziness, presyncope, syncope, bleeding, or neurologic sequela. The patient is tolerating medications without difficulties and is otherwise without complaint today.    Past Medical History:  Diagnosis Date  . Bladder cancer ([place])    resection x3  . CHF (congestive heart failure) (HCC)    class II/III (systolic )  . Cigarette smoker   . Coronary artery disease    status post DMI RX Taxus stent RCA 2006 with susequent Stent LAD and subsequent  stent thrombosis RCA unable to be opened 2006 -neg mv 10/2008  . Diabetes mellitus   . Hyperlipidemia   . Hypertension   . Ischemic cardiomyopathy    ejection fraction of 40-45%  . Lumbar spinal stenosis   . Myocardial infarction   . NSVT (nonsustained ventricular tachycardia) ([place])   . Paroxysmal atrial fibrillation (HCC)   . PVD (peripheral vascular disease) ([place])    status post bilateral aortobifemoral bypass in1992 with recent fem to fembypass April  2011 per DR. Early  . S/P lumbar microdiscectomy    12/31/08   Past Surgical History:  Procedure Laterality Date  . Aorto-bifemoral bypass graft surgery with right fem-fem bypass April 2011]  1992 then 2011  . APPENDECTOMY    . left shoulder surgery    . LUMBAR MICRODISCECTOMY  12/31/08  . taxus stent     placed into his right coronary artery; stent placed to the LAD.       Current Outpatient Prescriptions  Medication Sig Dispense Refill  .  albuterol (PROVENTIL HFA;VENTOLIN HFA) 108 (90 BASE) MCG/ACT inhaler Inhale 2 puffs into the lungs as needed (Use as directed).     [name] aspirin 81 MG tablet Take 81 mg by mouth daily.      . carvedilol (COREG) 12.5 MG tablet Take 1 tablet (12.5 mg total) by mouth 2 (two) times daily. 180 tablet 3  . cholecalciferol (VITAMIN D) 1000 UNITS tablet Take 1,000 Units by mouth daily.      . cyclobenzaprine (FLEXERIL) 10 MG tablet Take 10 mg by mouth as needed for muscle spasms (back pain). Take as directed    . fluticasone (FLONASE) 50 MCG/ACT nasal spray Place 2 sprays into both nostrils daily as needed for allergies.     . furosemide (LASIX) 20 MG tablet Take 1 tablet (20 mg total) by mouth daily as needed for fluid. 90 tablet 3  . gabapentin (NEURONTIN) 400 MG capsule Take 400 mg by mouth 2 (two) times daily.    [name] HYDROcodone-acetaminophen (VICODIN) 5-500 MG per tablet Take 1 tablet by mouth every 6 (six) hours as needed for pain (back/hips).     . losartan (COZAAR) 100 MG tablet Take 1 tablet (100 mg total) by mouth daily. 90 tablet 3  . Melatonin 10 MG CAPS Take 1 tablet by mouth at bedtime.     . pravastatin (PRAVACHOL) 80 MG tablet Take 1 tablet (80 mg total) by mouth daily. 90 tablet 3  .  rivaroxaban (XARELTO) 20 MG TABS tablet Take 1 tablet (20 mg total) by mouth daily with supper. 30 tablet 6  . tamsulosin (FLOMAX) 0.4 MG CAPS capsule Take 1 capsule by mouth daily as needed. Take as directed    . trimethoprim-polymyxin b (POLYTRIM) ophthalmic solution Place 1 drop into both eyes 3 times/day as needed-between meals & bedtime (moisture).     [name] VICTOZA 18 MG/3ML SOLN injection 1.5 mg as directed. Once daily     No current facility-administered medications for this visit.     Allergies:   Lisinopril   Social History:  The patient  reports that he has been smoking Cigars.  He has a 50.00 pack-year smoking history. He has never used smokeless tobacco. He reports that he drinks alcohol. He reports  that he does not use drugs.   Family History:  The patient's  family history includes Cardiomyopathy in his mother; Coronary artery disease in his father; Diabetes in his father and sister; Heart attack in his father and sister; Heart disease in his father, mother, and sister; Hyperlipidemia in his father, mother, and sister; Stroke in his father.    ROS:  Please see the history of present illness.   All other systems are reviewed and negative.    PHYSICAL EXAM: VS:  BP 138/76   Pulse 73   Ht 5\' 8"  (1.727 m)   Wt 182 lb (82.6 kg)   BMI 27.67 kg/m  , BMI Body mass index is 27.67 kg/m. GEN: Well nourished, well developed, in no acute distress  HEENT: less dry MM Neck: no JVD, carotid bruits, or masses Cardiac: RRR; no murmurs, rubs, or gallops,no edema  Respiratory:  clear to auscultation bilaterally, normal work of breathing GI: soft, nontender, nondistended, + BS MS: no deformity or atrophy  Skin: warm and dry  Neuro:  Strength and sensation are intact Psych: euthymic mood, full affect  EKG:  EKG today reveals sinus rhythm 73 bpm, nonspecific ST/T changes  Lipid Panel     Component Value Date/Time   CHOL 149 11/07/2006 0958   TRIG 101 11/07/2006 0958   HDL 24.4 (L) 11/07/2006 0958   CHOLHDL 6.1 CALC 11/07/2006 0958   VLDL 20 11/07/2006 0958   LDLCALC 104 (H) 11/07/2006 0958     Wt Readings from Last 3 Encounters:  05/27/16 182 lb (82.6 kg)  04/27/16 187 lb (84.8 kg)  04/15/16 183 lb 9.6 oz (83.3 kg)      Other studies Reviewed: Additional studies/ records that were reviewed today include: Dr Angelena Form notes are reviewed,  Event monitor is reviewed (50 pages)  ekg and myoview are also reviewed   ASSESSMENT AND PLAN:  1.  NSVT/ ischemic CM/ CAD Doing well with coreg to 12.5mg  BID Dr Angelena Form to consider increasing further upon follow-up I have reviewed event monitor which reveals that episodes of NSVT are less frequent with increase coreg.  Asymptomatic No  indication for further EP intervention at this time. If episodes become more frequent, could consider cardiac MRI to better device anterior scar and EF.   Dr Angelena Form to continue to manage CHF   2. Postural dizziness Resolved off lasix  Follow-up:  Return to see me as needed Follow-up with Dr Angelena Form as scheduled  Current medicines are reviewed at length with the patient today.   The patient does not have concerns regarding his medicines.  The following changes were made today:  none   Signed, Thompson Grayer, MD  05/27/2016 11:37 AM  [address] [place] [address] [phone] (office) [phone] (fax)

## 2016-06-01 ENCOUNTER — Telehealth: Payer: Self-pay | Admitting: *Deleted

## 2016-06-01 NOTE — Telephone Encounter (Signed)
Left a message for the pt to call back to receive cardiac event monitor results and recommendations per Dr. Angelena Form.  Will forward to triage desktop for further follow-up with the pt tomorrow.

## 2016-06-01 NOTE — Telephone Encounter (Signed)
-----   Message from Burnell Blanks, MD sent at 06/01/2016  4:06 PM EST ----- Karlene Einstein, I am not sure if you are still here but I saw a triage note from you this afternoon. Fraser Din is out today. Can you let Mr. Nulf know that his cardiac monitor showed early ventricular beats and several short runs of non-sustained VT? This was reviewed with the pt at EP appointment 5 days ago (Dr. Rayann Heman 05/27/16). No changes. Continue Coreg.  Thanks, chris

## 2016-06-02 NOTE — Telephone Encounter (Signed)
Spoke with wife, DPR on file.  Went over results and recommendations.  Wife verbalized understanding and was in agreement with this plan. 

## 2016-06-06 DIAGNOSIS — H9193 Unspecified hearing loss, bilateral: Secondary | ICD-10-CM | POA: Diagnosis not present

## 2016-06-06 DIAGNOSIS — J3489 Other specified disorders of nose and nasal sinuses: Secondary | ICD-10-CM | POA: Diagnosis not present

## 2016-06-06 DIAGNOSIS — J342 Deviated nasal septum: Secondary | ICD-10-CM | POA: Diagnosis not present

## 2016-06-06 DIAGNOSIS — F17200 Nicotine dependence, unspecified, uncomplicated: Secondary | ICD-10-CM | POA: Diagnosis not present

## 2016-06-09 DIAGNOSIS — R195 Other fecal abnormalities: Secondary | ICD-10-CM | POA: Diagnosis not present

## 2016-06-09 DIAGNOSIS — Z791 Long term (current) use of non-steroidal anti-inflammatories (NSAID): Secondary | ICD-10-CM | POA: Diagnosis not present

## 2016-06-14 ENCOUNTER — Other Ambulatory Visit: Payer: Self-pay | Admitting: Cardiovascular Disease

## 2016-06-15 DIAGNOSIS — E119 Type 2 diabetes mellitus without complications: Secondary | ICD-10-CM | POA: Diagnosis not present

## 2016-06-15 DIAGNOSIS — H25013 Cortical age-related cataract, bilateral: Secondary | ICD-10-CM | POA: Diagnosis not present

## 2016-06-15 DIAGNOSIS — H52223 Regular astigmatism, bilateral: Secondary | ICD-10-CM | POA: Diagnosis not present

## 2016-06-15 NOTE — Telephone Encounter (Signed)
Order Providers   Prescribing Provider Encounter Provider  Burnell Blanks, MD Burnell Blanks, MD  Medication Detail    Disp Refills Start End   pravastatin (PRAVACHOL) 80 MG tablet 90 tablet 3 03/09/2016    Sig - Route: Take 1 tablet (80 mg total) by mouth daily. - Oral   E-Prescribing Status: Receipt confirmed by pharmacy (03/09/2016 11:47 AM EST)   Pharmacy   [place], [place].

## 2016-06-24 DIAGNOSIS — K2970 Gastritis, unspecified, without bleeding: Secondary | ICD-10-CM | POA: Diagnosis not present

## 2016-06-24 DIAGNOSIS — Z86010 Personal history of colonic polyps: Secondary | ICD-10-CM | POA: Diagnosis not present

## 2016-06-24 DIAGNOSIS — K5730 Diverticulosis of large intestine without perforation or abscess without bleeding: Secondary | ICD-10-CM | POA: Diagnosis not present

## 2016-06-24 DIAGNOSIS — K2901 Acute gastritis with bleeding: Secondary | ICD-10-CM | POA: Diagnosis not present

## 2016-06-24 DIAGNOSIS — R195 Other fecal abnormalities: Secondary | ICD-10-CM | POA: Diagnosis not present

## 2016-06-24 DIAGNOSIS — Z791 Long term (current) use of non-steroidal anti-inflammatories (NSAID): Secondary | ICD-10-CM | POA: Diagnosis not present

## 2016-06-24 DIAGNOSIS — K3189 Other diseases of stomach and duodenum: Secondary | ICD-10-CM | POA: Diagnosis not present

## 2016-06-30 ENCOUNTER — Encounter: Payer: Self-pay | Admitting: Cardiovascular Disease

## 2016-07-11 ENCOUNTER — Ambulatory Visit (INDEPENDENT_AMBULATORY_CARE_PROVIDER_SITE_OTHER): Payer: PPO | Admitting: Cardiovascular Disease

## 2016-07-11 ENCOUNTER — Encounter: Payer: Self-pay | Admitting: Cardiovascular Disease

## 2016-07-11 ENCOUNTER — Telehealth: Payer: Self-pay | Admitting: *Deleted

## 2016-07-11 VITALS — BP 118/64 | HR 90 | Ht 68.0 in | Wt 179.2 lb

## 2016-07-11 DIAGNOSIS — I255 Ischemic cardiomyopathy: Secondary | ICD-10-CM | POA: Diagnosis not present

## 2016-07-11 DIAGNOSIS — I779 Disorder of arteries and arterioles, unspecified: Secondary | ICD-10-CM | POA: Diagnosis not present

## 2016-07-11 DIAGNOSIS — I2510 Atherosclerotic heart disease of native coronary artery without angina pectoris: Secondary | ICD-10-CM

## 2016-07-11 DIAGNOSIS — I472 Ventricular tachycardia: Secondary | ICD-10-CM

## 2016-07-11 DIAGNOSIS — I10 Essential (primary) hypertension: Secondary | ICD-10-CM | POA: Diagnosis not present

## 2016-07-11 DIAGNOSIS — Z720 Tobacco use: Secondary | ICD-10-CM

## 2016-07-11 DIAGNOSIS — I480 Paroxysmal atrial fibrillation: Secondary | ICD-10-CM

## 2016-07-11 DIAGNOSIS — I739 Peripheral vascular disease, unspecified: Secondary | ICD-10-CM | POA: Diagnosis not present

## 2016-07-11 DIAGNOSIS — I4729 Other ventricular tachycardia: Secondary | ICD-10-CM

## 2016-07-11 NOTE — Patient Instructions (Signed)

## 2016-07-11 NOTE — Progress Notes (Signed)
Chief Complaint  Patient presents with  . Follow-up    CAD     History of Present Illness: 71 yo WM with history of  Atrial fibrillation, CAD, ischemic cardiomyopathy, HTN, hyperlipidemia, ongoing tobacco abuse and PAD who is here today for follow up. His PV issues are followed by Dr. Donnetta Hutching in [facility].  He has had bladder cancer and multiple surgeries on his bladder. Last  Carotid artery dopplers August 2016 with mild bilateral disease. He is known to have CAD with inferior MI in 1992, PTCA of RCA at that time. He had an anterior MI in 2000 and had a stent placed in the LAD. In 2006, he had an inferior MI with Taxus stent placed in the RCA. In September 2006, repeat cath with occlusion of RCA in the stent. 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 Echo 2017 with stable mild LV systolic dysfunction, QHUT=65-46% (previous 35-40%). Nuclear stress test September 2017 with no ischemia. He was seen here in our office 03/04/16 by Truitt Merle, NP with c/o dizziness, cough, weakness and was found to be in atrial fibrillation with slow rate. Cardiac monitor Feb 2018 with NSVT. He was seen in EP clinic by Dr. Rayann Heman. Beta blocker changed to Coreg and Xarelto was started. Dizziness resolved when Lasix was stopped.   He is here today for follow up. He has no chest pain or SOB, near syncope or syncope.  He feels great. Occasional left leg pain. He is still smoking.   Primary Care Physician: Charletta Cousin, MD Sherlyn Lees, FNP-BC)  Past Medical History:  Diagnosis Date  . Bladder cancer ([place])    resection x3  . CHF (congestive heart failure) ([facility])    class II/III (systolic )  . Cigarette smoker   . Coronary artery disease    status post DMI RX Taxus stent RCA 2006 with susequent Stent LAD and subsequent  stent thrombosis RCA unable to be opened 2006 -neg mv 10/2008  . Diabetes mellitus   . Hyperlipidemia   . Hypertension   . Ischemic cardiomyopathy    ejection fraction of  40-45%  . Lumbar spinal stenosis   . Myocardial infarction   . NSVT (nonsustained ventricular tachycardia) ([place])   . Paroxysmal atrial fibrillation (H[facility])   . PVD (peripheral vascular disease) ([place])    status post bilateral aortobifemoral bypass in1992 with recent fem to fembypass April  2011 per DR. Early  . S/P lumbar microdiscectomy    12/31/08    Past Surgical History:  Procedure Laterality Date  . Aorto-bifemoral bypass graft surgery with right fem-fem bypass April 2011]  1992 then 2011  . APPENDECTOMY    . left shoulder surgery    . LUMBAR MICRODISCECTOMY  12/31/08  . taxus stent     placed into his right coronary artery; stent placed to the LAD.      Current Outpatient Prescriptions  Medication Sig Dispense Refill  . albuterol (PROVENTIL HFA;VENTOLIN HFA) 108 (90 BASE) MCG/ACT inhaler Inhale 2 puffs into the lungs as needed (Use as directed).     [name] aspirin 81 MG tablet Take 81 mg by mouth daily.      . carvedilol (COREG) 12.5 MG tablet Take 1 tablet (12.5 mg total) by mouth 2 (two) times daily. 180 tablet 3  . cholecalciferol (VITAMIN D) 1000 UNITS tablet Take 1,000 Units by mouth daily.      . cyclobenzaprine (FLEXERIL) 10 MG tablet Take 10 mg by  mouth as needed for muscle spasms (back pain). Take as directed    . fluticasone (FLONASE) 50 MCG/ACT nasal spray Place 2 sprays into both nostrils daily as needed for allergies.     . furosemide (LASIX) 20 MG tablet Take 1 tablet (20 mg total) by mouth daily as needed for fluid. 90 tablet 3  . gabapentin (NEURONTIN) 400 MG capsule Take 400 mg by mouth 2 (two) times daily.    [name] HYDROcodone-acetaminophen (VICODIN) 5-500 MG per tablet Take 1 tablet by mouth every 6 (six) hours as needed for pain (back/hips).     . losartan (COZAAR) 100 MG tablet Take 1 tablet (100 mg total) by mouth daily. 90 tablet 3  . Melatonin 10 MG CAPS Take 1 tablet by mouth at bedtime.     . pravastatin (PRAVACHOL) 80 MG tablet Take 1 tablet (80 mg total) by  mouth daily. 90 tablet 3  . rivaroxaban (XARELTO) 20 MG TABS tablet Take 1 tablet (20 mg total) by mouth daily with supper. 30 tablet 6  . tamsulosin (FLOMAX) 0.4 MG CAPS capsule Take 1 capsule by mouth daily as needed. Take as directed    . trimethoprim-polymyxin b (POLYTRIM) ophthalmic solution Place 1 drop into both eyes 3 times/day as needed-between meals & bedtime (moisture).     [name] VICTOZA 18 MG/3ML SOLN injection 1.5 mg as directed. Once daily     No current facility-administered medications for this visit.     Allergies  Allergen Reactions  . Lisinopril Swelling and Rash    Rash - face and tounge swell    Social History   Social History  . Marital status: Married    Spouse name: N/A  . Number of children: 1  . Years of education: N/A   Occupational History  . retired Retired    Administrator   Social History Main Topics  . Smoking status: Current Every Day Smoker    Packs/day: 1.00    Years: 50.00    Types: Cigars  . Smokeless tobacco: Never Used  . Alcohol use Yes     Comment: occ  . Drug use: No  . Sexual activity: Not on file   Other Topics Concern  . Not on file   Social History Narrative   Lives with girlfriend in [place].      Family History  Problem Relation Age of Onset  . Coronary artery disease    . Diabetes    . Cardiomyopathy Mother   . Heart disease Mother   . Hyperlipidemia Mother   . Coronary artery disease Father   . Stroke Father   . Diabetes Father   . Heart disease Father     before age 45  . Hyperlipidemia Father   . Heart attack Father   . Diabetes Sister   . Heart disease Sister     before age 22  . Hyperlipidemia Sister   . Heart attack Sister     Review of Systems:  As stated in the HPI and otherwise negative.   BP 118/64   Pulse 90   Ht 5\' 8"  (1.727 m)   Wt 179 lb 3.2 oz (81.3 kg)   SpO2 95%   BMI 27.25 kg/m   Physical Examination: General: Well developed, well nourished, NAD  HEENT: OP clear, mucus  membranes moist  SKIN: warm, dry. No rashes. Neuro: No focal deficits  Musculoskeletal: Muscle strength 5/5 all ext  Psychiatric: Mood and affect normal  Neck: No JVD, right  carotid  bruit, no thyromegaly, no lymphadenopathy.  Lungs:Clear bilaterally, no wheezes, rhonci, crackles Cardiovascular: Tachy. Regular with ectopy. No murmurs, gallops or rubs. Abdomen:Soft. Bowel sounds present. Non-tender.  Extremities: No lower extremity edema. Pulses are 1 + in the bilateral DP/PT.  Echo October 2017: Left ventricle: Wall thickness was increased in a pattern of   moderate LVH. Systolic function was mildly to moderately reduced.   The estimated ejection fraction was in the range of 40% to 45%.   Akinesis of the mid-apicalanteroseptal myocardium. Features are   consistent with a pseudonormal left ventricular filling pattern,   with concomitant abnormal relaxation and increased filling   pressure (grade 2 diastolic dysfunction).  EKG:  EKG is not ordered today. The ekg ordered today demonstrates   Recent Labs: No results found for requested labs within last 8760 hours.   Lipid Panel Followed in primary care   Wt Readings from Last 3 Encounters:  07/11/16 179 lb 3.2 oz (81.3 kg)  05/27/16 182 lb (82.6 kg)  04/27/16 187 lb (84.8 kg)     Other studies Reviewed: Additional studies/ records that were reviewed today include: . Review of the above records demonstrates:    Assessment and Plan:   1. CAD without angina: He has no chest pain suggestive of angina. He is known to have CAD with inferior MI in in the early 90s and anterior MI in 2000 with recurrent inferior MI in 2006. Nuclear stress test 2017 with no ischemia. Continue medical management with ASA and statin. Beta blocker stopped due to bradycardia.   2. CARDIOMYOPATHY, ISCHEMIC: LVEF 40-45% in October 2017. Continue medical therapy with beta blocker and ARB.    3. PAD: Stable. Followed in [facility]. He is asked top set up an appt  with Dr. Donnetta Hutching.   4. Atrial fibrillation, paroxysmal: He appears to be in sinus today. Continue beta blocker for rate control. He is anti-coagulated on Xarelto.    5. TOBACCO ABUSE: Smoking cessation recommended. I spent ten minutes on counseling today. He is trying to stop.   6. HTN: BP controlled. No changes.   7. Carotid artery disease: Mild disease by dopplers August 2016. Repeat August 2019.  Current medicines are reviewed at length with the patient today.  The patient does not have concerns regarding medicines.  The following changes have been made:  no change  Labs/ tests ordered today include:   No orders of the defined types were placed in this encounter.   Disposition:   FU with me in 6  months  Signed, Lauree Chandler, MD 07/11/2016 10:51 AM    [facility] [place], [place], [place]  [address] Phone: [phone]; Fax: [phone]

## 2016-07-11 NOTE — Telephone Encounter (Signed)
Pt given 2 weeks of  Xarelto samples  at office visit today

## 2016-08-17 DIAGNOSIS — M79671 Pain in right foot: Secondary | ICD-10-CM | POA: Diagnosis not present

## 2016-08-17 DIAGNOSIS — M79672 Pain in left foot: Secondary | ICD-10-CM | POA: Diagnosis not present

## 2016-08-17 DIAGNOSIS — E1165 Type 2 diabetes mellitus with hyperglycemia: Secondary | ICD-10-CM | POA: Diagnosis not present

## 2016-08-17 DIAGNOSIS — G5790 Unspecified mononeuropathy of unspecified lower limb: Secondary | ICD-10-CM | POA: Diagnosis not present

## 2016-08-17 DIAGNOSIS — E1151 Type 2 diabetes mellitus with diabetic peripheral angiopathy without gangrene: Secondary | ICD-10-CM | POA: Diagnosis not present

## 2016-08-26 DIAGNOSIS — Z79899 Other long term (current) drug therapy: Secondary | ICD-10-CM | POA: Diagnosis not present

## 2016-08-26 DIAGNOSIS — Z87898 Personal history of other specified conditions: Secondary | ICD-10-CM | POA: Diagnosis not present

## 2016-08-26 DIAGNOSIS — E1151 Type 2 diabetes mellitus with diabetic peripheral angiopathy without gangrene: Secondary | ICD-10-CM | POA: Diagnosis not present

## 2016-08-26 DIAGNOSIS — E1122 Type 2 diabetes mellitus with diabetic chronic kidney disease: Secondary | ICD-10-CM | POA: Diagnosis not present

## 2016-08-26 DIAGNOSIS — E559 Vitamin D deficiency, unspecified: Secondary | ICD-10-CM | POA: Diagnosis not present

## 2016-08-26 DIAGNOSIS — I1310 Hypertensive heart and chronic kidney disease without heart failure, with stage 1 through stage 4 chronic kidney disease, or unspecified chronic kidney disease: Secondary | ICD-10-CM | POA: Diagnosis not present

## 2016-08-26 DIAGNOSIS — E785 Hyperlipidemia, unspecified: Secondary | ICD-10-CM | POA: Diagnosis not present

## 2016-08-26 DIAGNOSIS — E1165 Type 2 diabetes mellitus with hyperglycemia: Secondary | ICD-10-CM | POA: Diagnosis not present

## 2016-08-26 DIAGNOSIS — E291 Testicular hypofunction: Secondary | ICD-10-CM | POA: Diagnosis not present

## 2016-08-26 DIAGNOSIS — J449 Chronic obstructive pulmonary disease, unspecified: Secondary | ICD-10-CM | POA: Diagnosis not present

## 2016-08-26 DIAGNOSIS — N401 Enlarged prostate with lower urinary tract symptoms: Secondary | ICD-10-CM | POA: Diagnosis not present

## 2016-08-26 DIAGNOSIS — N182 Chronic kidney disease, stage 2 (mild): Secondary | ICD-10-CM | POA: Diagnosis not present

## 2016-09-19 DIAGNOSIS — C679 Malignant neoplasm of bladder, unspecified: Secondary | ICD-10-CM | POA: Diagnosis not present

## 2016-09-19 DIAGNOSIS — N401 Enlarged prostate with lower urinary tract symptoms: Secondary | ICD-10-CM | POA: Diagnosis not present

## 2016-09-26 DIAGNOSIS — N182 Chronic kidney disease, stage 2 (mild): Secondary | ICD-10-CM | POA: Diagnosis not present

## 2016-09-26 DIAGNOSIS — E1122 Type 2 diabetes mellitus with diabetic chronic kidney disease: Secondary | ICD-10-CM | POA: Diagnosis not present

## 2016-12-05 ENCOUNTER — Other Ambulatory Visit: Payer: Self-pay | Admitting: Cardiovascular Disease

## 2016-12-05 NOTE — Telephone Encounter (Signed)
Last saw Dr. Angelena Form 06/2016. 71yrs old. SCr 1.10 in October 2017. Wt 81.3Kg. CrCl is 70.83 mL/min, Xarelto 20mg  QD refilled.

## 2016-12-30 DIAGNOSIS — J449 Chronic obstructive pulmonary disease, unspecified: Secondary | ICD-10-CM | POA: Diagnosis not present

## 2016-12-30 DIAGNOSIS — E1122 Type 2 diabetes mellitus with diabetic chronic kidney disease: Secondary | ICD-10-CM | POA: Diagnosis not present

## 2016-12-30 DIAGNOSIS — Z79899 Other long term (current) drug therapy: Secondary | ICD-10-CM | POA: Diagnosis not present

## 2016-12-30 DIAGNOSIS — E291 Testicular hypofunction: Secondary | ICD-10-CM | POA: Diagnosis not present

## 2016-12-30 DIAGNOSIS — N401 Enlarged prostate with lower urinary tract symptoms: Secondary | ICD-10-CM | POA: Diagnosis not present

## 2016-12-30 DIAGNOSIS — E785 Hyperlipidemia, unspecified: Secondary | ICD-10-CM | POA: Diagnosis not present

## 2016-12-30 DIAGNOSIS — I4891 Unspecified atrial fibrillation: Secondary | ICD-10-CM | POA: Diagnosis not present

## 2016-12-30 DIAGNOSIS — N182 Chronic kidney disease, stage 2 (mild): Secondary | ICD-10-CM | POA: Diagnosis not present

## 2016-12-30 DIAGNOSIS — I1310 Hypertensive heart and chronic kidney disease without heart failure, with stage 1 through stage 4 chronic kidney disease, or unspecified chronic kidney disease: Secondary | ICD-10-CM | POA: Diagnosis not present

## 2016-12-30 DIAGNOSIS — E559 Vitamin D deficiency, unspecified: Secondary | ICD-10-CM | POA: Diagnosis not present

## 2017-01-16 DIAGNOSIS — J22 Unspecified acute lower respiratory infection: Secondary | ICD-10-CM | POA: Diagnosis not present

## 2017-01-25 DIAGNOSIS — R413 Other amnesia: Secondary | ICD-10-CM | POA: Diagnosis not present

## 2017-01-25 DIAGNOSIS — Z23 Encounter for immunization: Secondary | ICD-10-CM | POA: Diagnosis not present

## 2017-03-28 NOTE — Progress Notes (Signed)
Chief Complaint  Patient presents with  . Follow-up    CAD  . Leg Pain     History of Present Illness: 71 yo male with history of atrial fibrillation, CAD, ischemic cardiomyopathy, HTN, hyperlipidemia, ongoing tobacco abuse and PAD who is here today for follow up. His PV issues are followed by Dr. Donnetta Hutching in [facility].  He has had bladder cancer and multiple surgeries on his bladder. Last carotid artery dopplers August 2016 with mild bilateral disease. He is known to have CAD with inferior MI in 1992, PTCA of RCA at that time. He had an anterior MI in 2000 and had a stent placed in the LAD. In 2006, he had an inferior MI with Taxus stent placed in the RCA. In September 2006, repeat cath with occlusion of RCA in the stent. 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 Echo 2017 with stable mild LV systolic dysfunction, ZOXW=96-04% (previous 35-40%). Nuclear stress test September 2017 with no ischemia. He was seen here in our office 03/04/16 by Truitt Merle, NP with c/o dizziness, cough, weakness and was found to be in atrial fibrillation with slow rate. Cardiac monitor Feb 2018 with NSVT. He was seen in EP clinic by Dr. Rayann Heman. Beta blocker changed to Coreg and Xarelto was started. Dizziness resolved when Lasix was stopped.   He is here today for follow up. The patient denies any chest pain, dyspnea, palpitations, lower extremity edema, orthopnea, PND, dizziness, near syncope or syncope.   Primary Care Physician: Melony Overly, MD Sherlyn Lees, FNP-BC)  Past Medical History:  Diagnosis Date  . Bladder cancer ([place])    resection x3  . CHF (congestive heart failure) (HCC)    class II/III (systolic )  . Cigarette smoker   . Coronary artery disease    status post DMI RX Taxus stent RCA 2006 with susequent Stent LAD and subsequent  stent thrombosis RCA unable to be opened 2006 -neg mv 10/2008  . Diabetes mellitus   . Hyperlipidemia   . Hypertension   . Ischemic  cardiomyopathy    ejection fraction of 40-45%  . Lumbar spinal stenosis   . Myocardial infarction ([place])   . NSVT (nonsustained ventricular tachycardia) ([place])   . Paroxysmal atrial fibrillation (HCC)   . PVD (peripheral vascular disease) ([place])    status post bilateral aortobifemoral bypass in1992 with recent fem to fembypass April  2011 per DR. Early  . S/P lumbar microdiscectomy    12/31/08    Past Surgical History:  Procedure Laterality Date  . Aorto-bifemoral bypass graft surgery with right fem-fem bypass April 2011]  1992 then 2011  . APPENDECTOMY    . left shoulder surgery    . LUMBAR MICRODISCECTOMY  12/31/08  . taxus stent     placed into his right coronary artery; stent placed to the LAD.      Current Outpatient Medications  Medication Sig Dispense Refill  . albuterol (PROVENTIL HFA;VENTOLIN HFA) 108 (90 BASE) MCG/ACT inhaler Inhale 2 puffs into the lungs as needed (Use as directed).     [name] aspirin 81 MG tablet Take 81 mg by mouth daily.      . carvedilol (COREG) 12.5 MG tablet Take 1 tablet (12.5 mg total) by mouth 2 (two) times daily. 180 tablet 3  . cholecalciferol (VITAMIN D) 1000 UNITS tablet Take 1,000 Units by mouth daily.      . cyclobenzaprine (FLEXERIL) 10 MG tablet Take 10 mg by mouth  as needed for muscle spasms (back pain). Take as directed    . fluticasone (FLONASE) 50 MCG/ACT nasal spray Place 2 sprays into both nostrils daily as needed for allergies.     . furosemide (LASIX) 20 MG tablet Take 1 tablet (20 mg total) by mouth daily as needed for fluid. 90 tablet 3  . gabapentin (NEURONTIN) 400 MG capsule Take 400 mg by mouth 2 (two) times daily.    [name] HYDROcodone-acetaminophen (VICODIN) 5-500 MG per tablet Take 1 tablet by mouth every 6 (six) hours as needed for pain (back/hips).     . losartan (COZAAR) 100 MG tablet Take 1 tablet (100 mg total) by mouth daily. 90 tablet 3  . Melatonin 10 MG CAPS Take 1 tablet by mouth at bedtime.     . pravastatin (PRAVACHOL) 80  MG tablet Take 1 tablet (80 mg total) by mouth daily. 90 tablet 3  . rivaroxaban (XARELTO) 20 MG TABS tablet TAKE 1 TABLET BY MOUTH ONCE DAILY WITH SUPPER. 90 tablet 3  . tamsulosin (FLOMAX) 0.4 MG CAPS capsule Take 1 capsule by mouth daily as needed. Take as directed    . trimethoprim-polymyxin b (POLYTRIM) ophthalmic solution Place 1 drop into both eyes 3 times/day as needed-between meals & bedtime (moisture).     [name] VICTOZA 18 MG/3ML SOLN injection 1.5 mg as directed. Once daily     No current facility-administered medications for this visit.     Allergies  Allergen Reactions  . Lisinopril Swelling and Rash    Rash - face and tounge swell    Social History   Socioeconomic History  . Marital status: Married    Spouse name: Not on file  . Number of children: 1  . Years of education: Not on file  . Highest education level: Not on file  Social Needs  . Financial resource strain: Not on file  . Food insecurity - worry: Not on file  . Food insecurity - inability: Not on file  . Transportation needs - medical: Not on file  . Transportation needs - non-medical: Not on file  Occupational History  . 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 Smoking status: Current Every Day Smoker    Packs/day: 1.00    Years: 50.00    Pack years: 50.00    Types: Cigars  . Smokeless tobacco: Never Used  Substance and Sexual Activity  . Alcohol use: Yes    Comment: occ  . Drug use: No  . Sexual activity: Not on file  Other Topics Concern  . Not on file  Social History Narrative   Lives with girlfriend in [place].      Family History  Problem Relation Age of Onset  . Coronary artery disease Unknown   . Diabetes Unknown   . Cardiomyopathy Mother   . Heart disease Mother   . Hyperlipidemia Mother   . Coronary artery disease Father   . Stroke Father   . Diabetes Father   . Heart disease Father        before age 17  . Hyperlipidemia Father   .  Heart attack Father   . Diabetes Sister   . Heart disease Sister        before age 44  . Hyperlipidemia Sister   . Heart attack Sister     Review of Systems:  As stated in the HPI and otherwise negative.   BP 118/80   Pulse 85  Ht 5\' 8"  (1.727 m)   Wt 165 lb 3.2 oz (74.9 kg)   SpO2 98%   BMI 25.12 kg/m   Physical Examination:  General: Well developed, well nourished, NAD  HEENT: OP clear, mucus membranes moist  SKIN: warm, dry. No rashes. Neuro: No focal deficits  Musculoskeletal: Muscle strength 5/5 all ext  Psychiatric: Mood and affect normal  Neck: No JVD, no carotid bruits, no thyromegaly, no lymphadenopathy.  Lungs:Clear bilaterally, no wheezes, rhonci, crackles Cardiovascular: Regular rate and rhythm. No murmurs, gallops or rubs. Abdomen:Soft. Bowel sounds present. Non-tender.  Extremities: No lower extremity edema. Pulses are 2 + in the bilateral DP/PT.  Echo October 2017: Left ventricle: Wall thickness was increased in a pattern of   moderate LVH. Systolic function was mildly to moderately reduced.   The estimated ejection fraction was in the range of 40% to 45%.   Akinesis of the mid-apicalanteroseptal myocardium. Features are   consistent with a pseudonormal left ventricular filling pattern,   with concomitant abnormal relaxation and increased filling   pressure (grade 2 diastolic dysfunction).  EKG:  EKG is  ordered today. The ekg ordered today demonstrates Sinus, rate 85 bpm. PVCs. T wave abn  Recent Labs: No results found for requested labs within last 8760 hours.   Lipid Panel Followed in primary care   Wt Readings from Last 3 Encounters:  03/30/17 165 lb 3.2 oz (74.9 kg)  07/11/16 179 lb 3.2 oz (81.3 kg)  05/27/16 182 lb (82.6 kg)     Other studies Reviewed: Additional studies/ records that were reviewed today include: . Review of the above records demonstrates:    Assessment and Plan:   1. CAD without angina: No chest pain. He is known  to have CAD with inferior MI in in the early 90s and anterior MI in 2000 with recurrent inferior MI in 2006. Nuclear stress test 2017 with no ischemia. Will continue ASA, beta blocker and statin.     2. CARDIOMYOPATHY, ISCHEMIC: LVEF 40-45% in October 2017. Will continue beta blocker and ARB.   3. PAD: Stable. Followed in [facility]. He needs to f/u in [facility]. His leg pain is stable.   4. Atrial fibrillation, paroxysmal: He appears to be in sinus today. Will continue beta blocker and Xarelto.     5. TOBACCO ABUSE: Smoking cessation is advised.   6. HTN: BP is controlled. No changes.   7. Carotid artery disease: Mild disease by dopplers August 2016. Repeat dopplers August 2019.   Current medicines are reviewed at length with the patient today.  The patient does not have concerns regarding medicines.  The following changes have been made:  no change  Labs/ tests ordered today include:   Orders Placed This Encounter  Procedures  . EKG 12-Lead    Disposition:   FU with me in 6  months  Signed, Lauree Chandler, MD 03/30/2017 11:39 AM    [facility] [place], [place], [place]  [address] Phone: [phone]; Fax: [phone]

## 2017-03-30 ENCOUNTER — Ambulatory Visit: Payer: PPO | Admitting: Cardiovascular Disease

## 2017-03-30 VITALS — BP 118/80 | HR 85 | Ht 68.0 in | Wt 165.2 lb

## 2017-03-30 DIAGNOSIS — I739 Peripheral vascular disease, unspecified: Secondary | ICD-10-CM

## 2017-03-30 DIAGNOSIS — Z720 Tobacco use: Secondary | ICD-10-CM | POA: Diagnosis not present

## 2017-03-30 DIAGNOSIS — I480 Paroxysmal atrial fibrillation: Secondary | ICD-10-CM | POA: Diagnosis not present

## 2017-03-30 DIAGNOSIS — I255 Ischemic cardiomyopathy: Secondary | ICD-10-CM | POA: Diagnosis not present

## 2017-03-30 DIAGNOSIS — I10 Essential (primary) hypertension: Secondary | ICD-10-CM | POA: Diagnosis not present

## 2017-03-30 DIAGNOSIS — I6523 Occlusion and stenosis of bilateral carotid arteries: Secondary | ICD-10-CM | POA: Diagnosis not present

## 2017-03-30 DIAGNOSIS — I2510 Atherosclerotic heart disease of native coronary artery without angina pectoris: Secondary | ICD-10-CM

## 2017-03-30 MED ORDER — RIVAROXABAN 20 MG PO TABS: ORAL_TABLET | ORAL | 3 refills | Status: DC

## 2017-03-30 NOTE — Patient Instructions (Signed)

## 2017-04-04 ENCOUNTER — Other Ambulatory Visit: Payer: Self-pay | Admitting: Cardiovascular Disease

## 2017-04-17 NOTE — Telephone Encounter (Signed)
error 

## 2017-05-04 ENCOUNTER — Other Ambulatory Visit: Payer: Self-pay | Admitting: Cardiovascular Disease

## 2017-05-17 DIAGNOSIS — Z720 Tobacco use: Secondary | ICD-10-CM | POA: Diagnosis not present

## 2017-05-17 DIAGNOSIS — Z9181 History of falling: Secondary | ICD-10-CM | POA: Diagnosis not present

## 2017-05-17 DIAGNOSIS — Z1331 Encounter for screening for depression: Secondary | ICD-10-CM | POA: Diagnosis not present

## 2017-05-17 DIAGNOSIS — Z125 Encounter for screening for malignant neoplasm of prostate: Secondary | ICD-10-CM | POA: Diagnosis not present

## 2017-05-17 DIAGNOSIS — Z87891 Personal history of nicotine dependence: Secondary | ICD-10-CM | POA: Diagnosis not present

## 2017-05-17 DIAGNOSIS — E1122 Type 2 diabetes mellitus with diabetic chronic kidney disease: Secondary | ICD-10-CM | POA: Diagnosis not present

## 2017-05-17 DIAGNOSIS — N182 Chronic kidney disease, stage 2 (mild): Secondary | ICD-10-CM | POA: Diagnosis not present

## 2017-05-17 DIAGNOSIS — E7800 Pure hypercholesterolemia, unspecified: Secondary | ICD-10-CM | POA: Diagnosis not present

## 2017-05-17 DIAGNOSIS — Z0000 Encounter for general adult medical examination without abnormal findings: Secondary | ICD-10-CM | POA: Diagnosis not present

## 2017-06-01 DIAGNOSIS — Z0000 Encounter for general adult medical examination without abnormal findings: Secondary | ICD-10-CM | POA: Diagnosis not present

## 2017-06-01 DIAGNOSIS — Z1331 Encounter for screening for depression: Secondary | ICD-10-CM | POA: Diagnosis not present

## 2017-06-01 DIAGNOSIS — E785 Hyperlipidemia, unspecified: Secondary | ICD-10-CM | POA: Diagnosis not present

## 2017-06-01 DIAGNOSIS — Z136 Encounter for screening for cardiovascular disorders: Secondary | ICD-10-CM | POA: Diagnosis not present

## 2017-06-06 DIAGNOSIS — J449 Chronic obstructive pulmonary disease, unspecified: Secondary | ICD-10-CM | POA: Diagnosis not present

## 2017-06-06 DIAGNOSIS — Z79899 Other long term (current) drug therapy: Secondary | ICD-10-CM | POA: Diagnosis not present

## 2017-06-06 DIAGNOSIS — E1122 Type 2 diabetes mellitus with diabetic chronic kidney disease: Secondary | ICD-10-CM | POA: Diagnosis not present

## 2017-06-06 DIAGNOSIS — E291 Testicular hypofunction: Secondary | ICD-10-CM | POA: Diagnosis not present

## 2017-06-06 DIAGNOSIS — I1310 Hypertensive heart and chronic kidney disease without heart failure, with stage 1 through stage 4 chronic kidney disease, or unspecified chronic kidney disease: Secondary | ICD-10-CM | POA: Diagnosis not present

## 2017-06-06 DIAGNOSIS — E559 Vitamin D deficiency, unspecified: Secondary | ICD-10-CM | POA: Diagnosis not present

## 2017-06-06 DIAGNOSIS — E785 Hyperlipidemia, unspecified: Secondary | ICD-10-CM | POA: Diagnosis not present

## 2017-06-06 DIAGNOSIS — N182 Chronic kidney disease, stage 2 (mild): Secondary | ICD-10-CM | POA: Diagnosis not present

## 2017-06-06 DIAGNOSIS — I4891 Unspecified atrial fibrillation: Secondary | ICD-10-CM | POA: Diagnosis not present

## 2017-06-06 DIAGNOSIS — N401 Enlarged prostate with lower urinary tract symptoms: Secondary | ICD-10-CM | POA: Diagnosis not present

## 2017-06-22 DIAGNOSIS — R1084 Generalized abdominal pain: Secondary | ICD-10-CM | POA: Diagnosis not present

## 2017-06-22 DIAGNOSIS — J329 Chronic sinusitis, unspecified: Secondary | ICD-10-CM | POA: Diagnosis not present

## 2017-06-22 DIAGNOSIS — J22 Unspecified acute lower respiratory infection: Secondary | ICD-10-CM | POA: Diagnosis not present

## 2017-07-03 ENCOUNTER — Other Ambulatory Visit: Payer: Self-pay

## 2017-07-03 MED ORDER — CARVEDILOL 12.5 MG PO TABS: 12.5000 mg | ORAL_TABLET | Freq: Two times a day (BID) | ORAL | 2 refills | Status: DC

## 2017-07-06 DIAGNOSIS — R82998 Other abnormal findings in urine: Secondary | ICD-10-CM | POA: Diagnosis not present

## 2017-07-06 DIAGNOSIS — C672 Malignant neoplasm of lateral wall of bladder: Secondary | ICD-10-CM | POA: Diagnosis not present

## 2017-07-06 DIAGNOSIS — N401 Enlarged prostate with lower urinary tract symptoms: Secondary | ICD-10-CM | POA: Diagnosis not present

## 2017-07-13 DIAGNOSIS — H25013 Cortical age-related cataract, bilateral: Secondary | ICD-10-CM | POA: Diagnosis not present

## 2017-07-13 DIAGNOSIS — E119 Type 2 diabetes mellitus without complications: Secondary | ICD-10-CM | POA: Diagnosis not present

## 2017-07-13 DIAGNOSIS — H524 Presbyopia: Secondary | ICD-10-CM | POA: Diagnosis not present

## 2017-08-23 DIAGNOSIS — E559 Vitamin D deficiency, unspecified: Secondary | ICD-10-CM | POA: Diagnosis not present

## 2017-08-23 DIAGNOSIS — I4891 Unspecified atrial fibrillation: Secondary | ICD-10-CM | POA: Diagnosis not present

## 2017-08-23 DIAGNOSIS — E1122 Type 2 diabetes mellitus with diabetic chronic kidney disease: Secondary | ICD-10-CM | POA: Diagnosis not present

## 2017-08-23 DIAGNOSIS — E291 Testicular hypofunction: Secondary | ICD-10-CM | POA: Diagnosis not present

## 2017-08-23 DIAGNOSIS — N401 Enlarged prostate with lower urinary tract symptoms: Secondary | ICD-10-CM | POA: Diagnosis not present

## 2017-08-23 DIAGNOSIS — I1310 Hypertensive heart and chronic kidney disease without heart failure, with stage 1 through stage 4 chronic kidney disease, or unspecified chronic kidney disease: Secondary | ICD-10-CM | POA: Diagnosis not present

## 2017-08-23 DIAGNOSIS — J449 Chronic obstructive pulmonary disease, unspecified: Secondary | ICD-10-CM | POA: Diagnosis not present

## 2017-08-23 DIAGNOSIS — E785 Hyperlipidemia, unspecified: Secondary | ICD-10-CM | POA: Diagnosis not present

## 2017-09-05 DIAGNOSIS — R0609 Other forms of dyspnea: Secondary | ICD-10-CM | POA: Diagnosis not present

## 2017-09-05 DIAGNOSIS — R5381 Other malaise: Secondary | ICD-10-CM | POA: Diagnosis not present

## 2017-09-18 DIAGNOSIS — T1501XA Foreign body in cornea, right eye, initial encounter: Secondary | ICD-10-CM | POA: Diagnosis not present

## 2017-09-19 DIAGNOSIS — J449 Chronic obstructive pulmonary disease, unspecified: Secondary | ICD-10-CM | POA: Diagnosis not present

## 2017-09-19 DIAGNOSIS — E1122 Type 2 diabetes mellitus with diabetic chronic kidney disease: Secondary | ICD-10-CM | POA: Diagnosis not present

## 2017-09-22 DIAGNOSIS — T1501XA Foreign body in cornea, right eye, initial encounter: Secondary | ICD-10-CM | POA: Diagnosis not present

## 2017-09-24 NOTE — Progress Notes (Signed)
Cardiology Office Note   Date:  09/25/2017   ID:  [name], DOB 1945-10-12, MRN [id number]  PCP:  Nicoletta Dress, MD  Cardiologist:  DR. [place]    Chief Complaint  Patient presents with  . Fatigue    no palpataions or rapid HR.        History of Present Illness: [name] is a 71 y.o. male who presents for fatigue abnormal EKG.    He has a history of atrial fibrillation, CAD, ischemic cardiomyopathy, HTN, hyperlipidemia, ongoing tobacco abuse and PAD who is here today for follow up. His PV issues are followed by Dr. Donnetta Hutching in [facility].  He has had bladder cancer and multiple surgeries on his bladder. Last carotid artery dopplers August 2016 with mild bilateral disease. He is known to have CAD with inferior MI in 1992, PTCA of RCA at that time. He had an anterior MI in 2000 and had a stent placed in the LAD. In 2006, he had an inferior MI with Taxus stent placed in the RCA. In September 2006, repeat cath with occlusion of RCA in the stent. 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 Nuclear stress test September 2017 with no ischemia. He was seen here in our office 03/04/16 by Truitt Merle, NP with c/o dizziness, cough, weakness and was found to be in atrial fibrillation with slow rate. Cardiac monitor Feb 2018 with NSVT. He was seen in EP clinic by Dr. Rayann Heman. Beta blocker changed to Coreg and Xarelto was started. Dizziness resolved when Lasix was stopped.   Pt here for continued fatigue for 3 months,  He had a sinus infection 2-3 months ago and is still fatigued.  No cardiac  Pain , but does have some "muscular pain across chest" does not feel like his MIs.  No awareness of Heart rate, no lightheadedness or dizziness.  Has increased DOE as well.    His appetite is poor, just not hungry and has lost 20 lbs this year.  He eats breakfast but may only eat a snack for dinner no  lunch.  His daughter is with him and was not aware of his lack of appetite. We discussed adding ensure at lunch and dinner.   Due to wt loss he is aware of his grafts in legs more.     Labs from PCP Cr. 1.04, K+ 4.6, alb 4.1, AST 12, TSH 1.790, Hgb 15.2  Troponin 0.03  Still smokes half a PPD really does not plan to stop.    Past Medical History:  Diagnosis Date  . Bladder cancer ([place])    resection x3  . CHF (congestive heart failure) (HCC)    class II/III (systolic )  . Cigarette smoker   . Coronary artery disease    status post DMI RX Taxus stent RCA 2006 with susequent Stent LAD and subsequent  stent thrombosis RCA unable to be opened 2006 -neg mv 10/2008  . Diabetes mellitus   . Hyperlipidemia   . Hypertension   . Ischemic cardiomyopathy    ejection fraction of 40-45%  . Lumbar spinal stenosis   . Myocardial infarction ([place])   . NSVT (nonsustained ventricular tachycardia) ([place])   . Paroxysmal atrial fibrillation (HCC)   . PVD (peripheral vascular disease) ([place])    status post bilateral aortobifemoral bypass in1992 with recent fem to fembypass April  2011 per DR. Early  .  S/P lumbar microdiscectomy    12/31/08    Past Surgical History:  Procedure Laterality Date  . Aorto-bifemoral bypass graft surgery with right fem-fem bypass April 2011]  1992 then 2011  . APPENDECTOMY    . left shoulder surgery    . LUMBAR MICRODISCECTOMY  12/31/08  . taxus stent     placed into his right coronary artery; stent placed to the LAD.       Current Outpatient Medications  Medication Sig Dispense Refill  . albuterol (PROVENTIL HFA;VENTOLIN HFA) 108 (90 BASE) MCG/ACT inhaler Inhale 2 puffs into the lungs as needed (Use as directed).     [name] aspirin 81 MG tablet Take 81 mg by mouth daily.      . carvedilol (COREG) 12.5 MG tablet Take 1 tablet (12.5 mg total) by mouth 2 (two) times daily. 180 tablet 2  . cholecalciferol (VITAMIN D) 1000 UNITS tablet Take 1,000 Units by mouth daily.      .  fluticasone (FLONASE) 50 MCG/ACT nasal spray Place 2 sprays into both nostrils daily as needed for allergies.     [name] gabapentin (NEURONTIN) 400 MG capsule Take 200 mg by mouth 2 (two) times daily.     [name] HYDROcodone-acetaminophen (VICODIN) 5-500 MG per tablet Take 1 tablet by mouth every 6 (six) hours as needed for pain (back/hips).     . losartan (COZAAR) 100 MG tablet TAKE 1 TABLET BY MOUTH ONCE DAILY 90 tablet 3  . Melatonin 10 MG CAPS Take 1 tablet by mouth at bedtime.     . pravastatin (PRAVACHOL) 80 MG tablet TAKE 1 TABLET BY MOUTH ONCE DAILY 90 tablet 3  . rivaroxaban (XARELTO) 20 MG TABS tablet TAKE 1 TABLET BY MOUTH ONCE DAILY WITH SUPPER. 90 tablet 3  . tamsulosin (FLOMAX) 0.4 MG CAPS capsule Take 1 capsule by mouth daily as needed. Take as directed    . trimethoprim-polymyxin b (POLYTRIM) ophthalmic solution Place 1 drop into both eyes 3 times/day as needed-between meals & bedtime (moisture).     [name] VICTOZA 18 MG/3ML SOLN injection 1.5 mg as directed. Once daily     No current facility-administered medications for this visit.     Allergies:   Lisinopril    Social History:  The patient  reports that he has been smoking cigars.  He has a 50.00 pack-year smoking history. He has never used smokeless tobacco. He reports that he drinks alcohol. He reports that he does not use drugs.   Family History:  The patient's family history includes Cardiomyopathy in his mother; Coronary artery disease in his father and unknown relative; Diabetes in his father, sister, and unknown relative; Heart attack in his father and sister; Heart disease in his father, mother, and sister; Hyperlipidemia in his father, mother, and sister; Stroke in his father.    ROS:  General:no colds or fevers, + weight loss about 20 lbs in last year.   Skin:no rashes or ulcers HEENT:no blurred vision, no congestion CV:see HPI PUL:see HPI GI:no diarrhea constipation or melena, no indigestion GU:no hematuria, no  dysuria MS:Hip pain bil., no claudication Neuro:no syncope, no lightheadedness Endo:+ diabetes controlled, no thyroid disease  Wt Readings from Last 3 Encounters:  09/25/17 160 lb 6.4 oz (72.8 kg)  03/30/17 165 lb 3.2 oz (74.9 kg)  07/11/16 179 lb 3.2 oz (81.3 kg)     PHYSICAL EXAM: VS:  BP 140/78   Pulse 76   Ht 5\' 8"  (1.727 m)   Wt 160 lb 6.4 oz (72.8  kg)   SpO2 97%   BMI 24.39 kg/m  , BMI Body mass index is 24.39 kg/m. General:Pleasant affect, NAD Skin:Warm and dry, brisk capillary refill HEENT:normocephalic, sclera clear, mucus membranes moist Neck:supple, no JVD, no bruits  Heart:S1S2 RRR without murmur, gallup, rub or click Lungs:clear without rales, though few rhonchi no wheezes CHE:NIDP, non tender, + BS, do not palpate liver spleen or masses Ext:no lower ext edema, 1+ pedal pulses, 2+ radial pulses Neuro:alert and oriented X 3, MAE, follows commands, + facial symmetry    EKG:  EKG is ordered today. The ekg ordered today demonstrates SR with PVCs-- Inf T wave abnormality with Occluded RCA. No changes.   Recent Labs: No results found for requested labs within last 8760 hours.    Lipid Panel    Component Value Date/Time   CHOL 149 11/07/2006 0958   TRIG 101 11/07/2006 0958   HDL 24.4 (L) 11/07/2006 0958   CHOLHDL 6.1 CALC 11/07/2006 0958   VLDL 20 11/07/2006 0958   LDLCALC 104 (H) 11/07/2006 0958    Tot chol is 147, HDL is 35 and LDL is 86 on 08/23/17, TG 129     Other studies Reviewed: Additional studies/ records that were reviewed today include:  01/26/2016 Echo Study Conclusions  - Left ventricle: Wall thickness was increased in a pattern of   moderate LVH. Systolic function was mildly to moderately reduced.   The estimated ejection fraction was in the range of 40% to 45%.   Akinesis of the mid-apicalanteroseptal myocardium. Features are   consistent with a pseudonormal left ventricular filling pattern,   with concomitant abnormal relaxation and  increased filling   pressure (grade 2 diastolic dysfunction).   nuc study 01/12/16  Study Highlights     Nuclear stress EF: 23%.  There was no ST segment deviation noted during stress.  The left ventricular ejection fraction is severely decreased (<30%).  Findings consistent with prior myocardial infarction.  This is a high risk study.   1. EF 23%, diffuse hypokinesis.  2. Fixed large, severe basal inferior/inferoseptal, mid inferolateral/inferior/inferoseptal, and apical inferior/septal/lateral perfusion defect.  This suggests prior MI without significant ischemia.   High risk study primarily due to low EF.    Cardiac cath 2006   CONCLUSION:  1.  Acute stent thrombosis of a stent in the distal right coronary artery      associated with a non-ST elevated myocardial infarction.  2.  Unsuccessful attempt at percutaneous coronary intervention of the lesion      in the mid and distal right coronary artery due to inability to cross      with the balloon.   DISPOSITION:  The patient was returned to the post angioplasty unit for  further observation.  We will plan a functional study to evaluate the  significance of the LAD lesion later, and plan initial medical therapy.  The  inferior wall probably does not have much viable myocardium remaining after  a third infarct in this area.   ASSESSMENT AND PLAN:  1.  Fatigue with chest discomfort.  Will check a lexiscan myoivew to eval CAD as cause for fatigue, no acute EKG changes.  Labs are WNL  Discussed with Dr. Angelena Form.  Pt will follow up with me or Dr. Angelena Form.    2.   CAD with prior stents to RCA now occluded and LAD.    3.   Tobacco use, down to half a ppd.  Encouraged to stop.  4.    HLD  controlled on pravachol  5.    Carotid artery disease, need repeat dopplers 11/2017   6.    PAF in SR  Continue xarelto and BB.  7.    Ischemic cardiomyopathy EF 40-45% 2017, if EF lower on myoview will check echo.  Continue BB.      8.     PAD followed  With Dr. Donnetta Hutching  32.     Wt loss of 20 lbs in a year.  Encouraged to add protein with ensure.  If continues to follow with PCP.      Current medicines are reviewed with the patient today.  The patient Has no concerns regarding medicines.  The following changes have been made:  See above Labs/ tests ordered today include:see above  Disposition:   FU:  see above  Signed, Cecilie Kicks, NP  09/25/2017 5:24 PM    [facility] [place], [place], [place], [place] Phone: [phone]; Fax: [phone]

## 2017-09-25 ENCOUNTER — Telehealth (HOSPITAL_COMMUNITY): Payer: Self-pay | Admitting: *Deleted

## 2017-09-25 ENCOUNTER — Telehealth (HOSPITAL_COMMUNITY): Payer: Self-pay | Admitting: Radiology

## 2017-09-25 ENCOUNTER — Encounter: Payer: Self-pay | Admitting: Cardiology

## 2017-09-25 ENCOUNTER — Ambulatory Visit (INDEPENDENT_AMBULATORY_CARE_PROVIDER_SITE_OTHER): Payer: PPO | Admitting: Cardiology

## 2017-09-25 ENCOUNTER — Encounter

## 2017-09-25 VITALS — BP 140/78 | HR 76 | Ht 68.0 in | Wt 160.4 lb

## 2017-09-25 DIAGNOSIS — R634 Abnormal weight loss: Secondary | ICD-10-CM | POA: Diagnosis not present

## 2017-09-25 DIAGNOSIS — I10 Essential (primary) hypertension: Secondary | ICD-10-CM

## 2017-09-25 DIAGNOSIS — Z720 Tobacco use: Secondary | ICD-10-CM

## 2017-09-25 DIAGNOSIS — I2510 Atherosclerotic heart disease of native coronary artery without angina pectoris: Secondary | ICD-10-CM

## 2017-09-25 DIAGNOSIS — I739 Peripheral vascular disease, unspecified: Secondary | ICD-10-CM

## 2017-09-25 DIAGNOSIS — I6523 Occlusion and stenosis of bilateral carotid arteries: Secondary | ICD-10-CM | POA: Diagnosis not present

## 2017-09-25 DIAGNOSIS — I255 Ischemic cardiomyopathy: Secondary | ICD-10-CM

## 2017-09-25 DIAGNOSIS — R9431 Abnormal electrocardiogram [ECG] [EKG]: Secondary | ICD-10-CM

## 2017-09-25 DIAGNOSIS — I480 Paroxysmal atrial fibrillation: Secondary | ICD-10-CM | POA: Diagnosis not present

## 2017-09-25 NOTE — Telephone Encounter (Signed)
No answer on home #.Left message on voicemail on mobile # in reference to upcoming appointment scheduled for 09/28/17. Phone number given for a call back so details instructions can be given. [name], [name]

## 2017-09-25 NOTE — Telephone Encounter (Signed)
Patient given detailed instructions per Myocardial Perfusion Study Information Sheet for the test on 09/28/2017 at 7:30. Patient notified to arrive 15 minutes early and that it is imperative to arrive on time for appointment to keep from having the test rescheduled.  If you need to cancel or reschedule your appointment, please call the office within 24 hours of your appointment. . Patient verbalized understanding.EHK

## 2017-09-25 NOTE — Patient Instructions (Addendum)
Medication Instructions:  Your physician recommends that you continue on your current medications as directed. Please refer to the Current Medication list given to you today.  Labwork: NONE  Testing/Procedures: Your physician has requested that you have a lexiscan myoview. For further information please visit [email or web address]. Please follow instruction sheet, as given.  Follow-Up: Your physician wants you to follow-up in: 2 months with Dr. Angelena Form.  If you need a refill on your cardiac medications before your next appointment, please call your pharmacy.

## 2017-09-26 DIAGNOSIS — I10 Essential (primary) hypertension: Secondary | ICD-10-CM | POA: Diagnosis not present

## 2017-09-26 DIAGNOSIS — H2511 Age-related nuclear cataract, right eye: Secondary | ICD-10-CM | POA: Diagnosis not present

## 2017-09-26 DIAGNOSIS — H25013 Cortical age-related cataract, bilateral: Secondary | ICD-10-CM | POA: Diagnosis not present

## 2017-09-26 DIAGNOSIS — H2513 Age-related nuclear cataract, bilateral: Secondary | ICD-10-CM | POA: Diagnosis not present

## 2017-09-27 ENCOUNTER — Telehealth: Payer: Self-pay | Admitting: Cardiovascular Disease

## 2017-09-27 DIAGNOSIS — H5711 Ocular pain, right eye: Secondary | ICD-10-CM | POA: Diagnosis not present

## 2017-09-27 NOTE — Telephone Encounter (Signed)
   [facility] Pre-operative Risk Assessment    Request for surgical clearance:  1. What type of surgery is being performed?  Cataract Extraction w/ Intraocular Lens Implantation of the right eye, followed by the left eye   2. When is this surgery scheduled? August 2019   3. What type of clearance is required (medical clearance vs. Pharmacy clearance to hold med vs. Both)? Medical  4. Are there any medications that need to be held prior to surgery and how long? No mention of holding meds but pt is on ASA and Xarelto   5. Practice name and name of physician performing surgery? [facility] - Dr. Talbert Forest  6. What is your office phone number?  [phone]   7.   What is your office fax number? [phone]  8.   Anesthesia type (None, local, MAC, general) ? Not mentioned   _________________________________________________________________   (provider comments below)

## 2017-09-27 NOTE — Telephone Encounter (Signed)
   Primary Cardiologist: Lauree Chandler, MD  Chart reviewed as part of pre-operative protocol coverage. Given past medical history and time since last visit, based on ACC/AHA guidelines, [name] would be at acceptable risk for the planned procedure without further cardiovascular testing.   I will route this recommendation to the requesting party via Epic fax function and remove from pre-op pool.  Please call with questions.  Lyda Jester, PA-C 09/27/2017, 2:16 PM

## 2017-09-28 ENCOUNTER — Ambulatory Visit (HOSPITAL_COMMUNITY): Payer: PPO | Attending: Cardiovascular Disease

## 2017-09-28 DIAGNOSIS — R9431 Abnormal electrocardiogram [ECG] [EKG]: Secondary | ICD-10-CM | POA: Insufficient documentation

## 2017-09-28 LAB — MYOCARDIAL PERFUSION IMAGING
CHL CUP NUCLEAR SDS: 2
LHR: 0.33
LVDIAVOL: 166 mL (ref 62–150)
LVSYSVOL: 119 mL
Peak HR: 88 {beats}/min
Rest HR: 73 {beats}/min
SRS: 42
SSS: 44
TID: 1.09

## 2017-09-28 MED ORDER — TECHNETIUM TC 99M TETROFOSMIN IV KIT
10.2000 | PACK | Freq: Once | INTRAVENOUS | Status: AC | PRN
  Administered 2017-09-28: 10.2 via INTRAVENOUS
  Filled 2017-09-28: qty 11

## 2017-09-28 MED ORDER — REGADENOSON 0.4 MG/5ML IV SOLN
0.4000 mg | Freq: Once | INTRAVENOUS | Status: AC
  Administered 2017-09-28: 0.4 mg via INTRAVENOUS

## 2017-09-28 MED ORDER — TECHNETIUM TC 99M TETROFOSMIN IV KIT
31.9000 | PACK | Freq: Once | INTRAVENOUS | Status: AC | PRN
  Administered 2017-09-28: 31.9 via INTRAVENOUS
  Filled 2017-09-28: qty 32

## 2017-09-29 DIAGNOSIS — H18839 Recurrent erosion of cornea, unspecified eye: Secondary | ICD-10-CM | POA: Diagnosis not present

## 2017-09-29 DIAGNOSIS — H5711 Ocular pain, right eye: Secondary | ICD-10-CM | POA: Diagnosis not present

## 2017-10-03 ENCOUNTER — Telehealth: Payer: Self-pay | Admitting: Cardiology

## 2017-10-03 DIAGNOSIS — I480 Paroxysmal atrial fibrillation: Secondary | ICD-10-CM

## 2017-10-03 DIAGNOSIS — I255 Ischemic cardiomyopathy: Secondary | ICD-10-CM

## 2017-10-03 DIAGNOSIS — R9431 Abnormal electrocardiogram [ECG] [EKG]: Secondary | ICD-10-CM

## 2017-10-03 NOTE — Telephone Encounter (Signed)
New message    Patient wife calling about the results for the myoview , please call and explain the results to her

## 2017-10-03 NOTE — Telephone Encounter (Signed)
-----   Message from Dennis Serge, NP sent at 09/29/2017  8:00 AM EDT ----- Ask pt to have echo, to help decide if this test has old problems or new.

## 2017-10-05 DIAGNOSIS — S0501XD Injury of conjunctiva and corneal abrasion without foreign body, right eye, subsequent encounter: Secondary | ICD-10-CM | POA: Diagnosis not present

## 2017-10-06 ENCOUNTER — Other Ambulatory Visit: Payer: Self-pay

## 2017-10-06 ENCOUNTER — Ambulatory Visit (HOSPITAL_COMMUNITY): Payer: PPO | Attending: Cardiovascular Disease

## 2017-10-06 DIAGNOSIS — I110 Hypertensive heart disease with heart failure: Secondary | ICD-10-CM | POA: Insufficient documentation

## 2017-10-06 DIAGNOSIS — I480 Paroxysmal atrial fibrillation: Secondary | ICD-10-CM

## 2017-10-06 DIAGNOSIS — I509 Heart failure, unspecified: Secondary | ICD-10-CM | POA: Diagnosis not present

## 2017-10-06 DIAGNOSIS — I739 Peripheral vascular disease, unspecified: Secondary | ICD-10-CM | POA: Insufficient documentation

## 2017-10-06 DIAGNOSIS — I2510 Atherosclerotic heart disease of native coronary artery without angina pectoris: Secondary | ICD-10-CM | POA: Insufficient documentation

## 2017-10-06 DIAGNOSIS — Z720 Tobacco use: Secondary | ICD-10-CM | POA: Diagnosis not present

## 2017-10-06 DIAGNOSIS — R9431 Abnormal electrocardiogram [ECG] [EKG]: Secondary | ICD-10-CM | POA: Diagnosis not present

## 2017-10-06 DIAGNOSIS — E785 Hyperlipidemia, unspecified: Secondary | ICD-10-CM | POA: Insufficient documentation

## 2017-10-06 DIAGNOSIS — I255 Ischemic cardiomyopathy: Secondary | ICD-10-CM | POA: Diagnosis not present

## 2017-10-10 ENCOUNTER — Telehealth: Payer: Self-pay | Admitting: Cardiovascular Disease

## 2017-10-10 NOTE — Telephone Encounter (Signed)
Called patient's wife (DPR) to schedule patient to see Dr. Angelena Form. Patient's wife wants to be there for appointment, so she is going to try to make it this Friday. Patient will be our of town the week of the 4th. Will forward to Dr. Camillia Herter nurse so she is aware.

## 2017-10-10 NOTE — Telephone Encounter (Signed)
Echo note received from [facility], LVEF is down. Nuclear study with scar. Pt with fatigue at visit with Mickel Baas two weeks ago. He will need an office visit with me. Can we see if he can come into the clinic to see me Friday at 10:15. I am a quarter day and have my last new TAVR consult at 9:30 in a 45 minute slot. Or I could see him on July 5th. Thanks, chris

## 2017-10-12 DIAGNOSIS — S0501XD Injury of conjunctiva and corneal abrasion without foreign body, right eye, subsequent encounter: Secondary | ICD-10-CM | POA: Diagnosis not present

## 2017-10-12 NOTE — Progress Notes (Signed)
Chief Complaint  Patient presents with  . Follow-up    CAD   History of Present Illness: 72 yo male with history of COPD, atrial fibrillation, CAD, ischemic cardiomyopathy, HTN, hyperlipidemia, ongoing tobacco abuse and PAD who is here today for follow up. His PV issues are followed by Dr. Donnetta Hutching in [facility].  He has had bladder cancer and multiple surgeries on his bladder. Last carotid artery dopplers August 2016 with mild bilateral disease. He is known to have CAD with inferior MI in 1992, PTCA of RCA at that time. He had an anterior MI in 2000 and had a stent placed in the LAD. In 2006, he had an inferior MI with Taxus stent placed in the RCA. In September 2006, repeat cath with occlusion of RCA in the stent. 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 Echo 2017 with stable mild LV systolic dysfunction, NWGN=56-21% (previous 35-40%). Nuclear stress test September 2017 with no ischemia but LVEF of 23%. He was seen here in our office 03/04/16 by Truitt Merle, NP with c/o dizziness, cough, weakness and was found to be in atrial fibrillation with slow rate. Cardiac monitor Feb 2018 with NSVT. He was seen in EP clinic by Dr. Rayann Heman. Beta blocker changed to Coreg and Xarelto was started. Dizziness resolved when Lasix was stopped. He was seen in our office 09/25/17 by Cecilie Kicks, NP and c/o dyspnea and fatigue. He also noted lack of appetite and 20 lb weight loss. He also reported soreness in the muscles of his chest, not like prior cardiac pain. Echo 10/06/17 with LVEF of 30% with hypokinesis of the inferolateral, inferior and inferoseptal walls. No significant valve disease noted. Nuclear stress test was identical to stress test in 2017 showing large scar in the inferior and apical walls with LVEF 29%. Of note, CT scan in 2017 in [facility] with evidence of emphysema but no masses or suspicious nodules.   He is here today for follow up. The patient denies any exertional chest  pain. He has chronic dyspnea. He has more fatigue. No palpitations, lower extremity edema, orthopnea, PND, dizziness, near syncope or syncope. He has lost 20 lbs over the past few months. Still smoking.   Primary Care Physician: Nicoletta Dress, MD Sherlyn Lees, FNP-BC)  Past Medical History:  Diagnosis Date  . Bladder cancer ([place])    resection x3  . CHF (congestive heart failure) ([facility])    class II/III (systolic )  . Cigarette smoker   . Coronary artery disease    status post DMI RX Taxus stent RCA 2006 with susequent Stent LAD and subsequent  stent thrombosis RCA unable to be opened 2006 -neg mv 10/2008  . Diabetes mellitus   . Hyperlipidemia   . Hypertension   . Ischemic cardiomyopathy    ejection fraction of 40-45%  . Lumbar spinal stenosis   . Myocardial infarction ([place])   . NSVT (nonsustained ventricular tachycardia) ([place])   . Paroxysmal atrial fibrillation ([facility])   . PVD (peripheral vascular disease) ([place])    status post bilateral aortobifemoral bypass in1992 with recent fem to fembypass April  2011 per DR. Early  . S/P lumbar microdiscectomy    12/31/08    Past Surgical History:  Procedure Laterality Date  . Aorto-bifemoral bypass graft surgery with right fem-fem bypass April 2011]  1992 then 2011  . APPENDECTOMY    . left shoulder surgery    . LUMBAR MICRODISCECTOMY  12/31/08  .  taxus stent     placed into his right coronary artery; stent placed to the LAD.      Current Outpatient Medications  Medication Sig Dispense Refill  . albuterol (PROVENTIL HFA;VENTOLIN HFA) 108 (90 BASE) MCG/ACT inhaler Inhale 2 puffs into the lungs as needed (Use as directed).     [name] aspirin 81 MG tablet Take 81 mg by mouth daily.      . carvedilol (COREG) 12.5 MG tablet Take 1 tablet (12.5 mg total) by mouth 2 (two) times daily. 180 tablet 2  . cholecalciferol (VITAMIN D) 1000 UNITS tablet Take 1,000 Units by mouth daily.      . fluticasone (FLONASE) 50 MCG/ACT nasal spray Place 2 sprays  into both nostrils daily as needed for allergies.     [name] gabapentin (NEURONTIN) 400 MG capsule Take 200 mg by mouth 2 (two) times daily.     [name] HYDROcodone-acetaminophen (VICODIN) 5-500 MG per tablet Take 1 tablet by mouth every 6 (six) hours as needed for pain (back/hips).     . losartan (COZAAR) 100 MG tablet TAKE 1 TABLET BY MOUTH ONCE DAILY 90 tablet 3  . Melatonin 10 MG CAPS Take 1 tablet by mouth at bedtime.     . pravastatin (PRAVACHOL) 80 MG tablet TAKE 1 TABLET BY MOUTH ONCE DAILY 90 tablet 3  . rivaroxaban (XARELTO) 20 MG TABS tablet TAKE 1 TABLET BY MOUTH ONCE DAILY WITH SUPPER. 90 tablet 3  . tamsulosin (FLOMAX) 0.4 MG CAPS capsule Take 1 capsule by mouth daily as needed. Take as directed    . trimethoprim-polymyxin b (POLYTRIM) ophthalmic solution Place 1 drop into both eyes 3 times/day as needed-between meals & bedtime (moisture).     [name] VICTOZA 18 MG/3ML SOLN injection 1.5 mg as directed. Once daily     No current facility-administered medications for this visit.     Allergies  Allergen Reactions  . Lisinopril Swelling and Rash    Rash - face and tounge swell    Social History   Socioeconomic History  . Marital status: Married    Spouse name: Not on file  . Number of children: 1  . Years of education: Not on file  . Highest education level: Not on file  Occupational History  . 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 Financial resource strain: Not on file  . Food insecurity:    Worry: Not on file    Inability: Not on file  . Transportation needs:    Medical: Not on file    Non-medical: Not on file  Tobacco Use  . Smoking status: Current Every Day Smoker    Packs/day: 1.00    Years: 50.00    Pack years: 50.00    Types: Cigars  . Smokeless tobacco: Never Used  Substance and Sexual Activity  . Alcohol use: Yes    Comment: occ  . Drug use: No  . Sexual activity: Not on file  Lifestyle  . Physical activity:    Days  per week: Not on file    Minutes per session: Not on file  . Stress: Not on file  Relationships  . Social connections:    Talks on phone: Not on file    Gets together: Not on file    Attends religious service: Not on file    Active member of club or organization: Not on file    Attends meetings of clubs or organizations: Not on  file    Relationship status: Not on file  . Intimate partner violence:    Fear of current or ex partner: Not on file    Emotionally abused: Not on file    Physically abused: Not on file    Forced sexual activity: Not on file  Other Topics Concern  . Not on file  Social History Narrative   Lives with girlfriend in [place].      Family History  Problem Relation Age of Onset  . Coronary artery disease Unknown   . Diabetes Unknown   . Cardiomyopathy Mother   . Heart disease Mother   . Hyperlipidemia Mother   . Coronary artery disease Father   . Stroke Father   . Diabetes Father   . Heart disease Father        before age 57  . Hyperlipidemia Father   . Heart attack Father   . Diabetes Sister   . Heart disease Sister        before age 27  . Hyperlipidemia Sister   . Heart attack Sister     Review of Systems:  As stated in the HPI and otherwise negative.   BP 128/68   Pulse (!) 53   Ht 5\' 8"  (1.727 m)   Wt 156 lb 12.8 oz (71.1 kg)   SpO2 98%   BMI 23.84 kg/m   Physical Examination:  General: Well developed, well nourished, NAD  HEENT: OP clear, mucus membranes moist  SKIN: warm, dry. No rashes. Neuro: No focal deficits  Musculoskeletal: Muscle strength 5/5 all ext  Psychiatric: Mood and affect normal  Neck: No JVD, no carotid bruits, no thyromegaly, no lymphadenopathy.  Lungs:Clear bilaterally, no wheezes, rhonci, crackles Cardiovascular: Regular rate and rhythm. No murmurs, gallops or rubs. Abdomen:Soft. Bowel sounds present. Non-tender.  Extremities: No lower extremity edema. Pulses are 2 + in the bilateral DP/PT.  Echo June  2019: - Left ventricle: The cavity size was mildly dilated. Systolic   function was severely reduced. The estimated ejection fraction   was in the range of 25% to 30%. Hypokinesis of the inferolateral,   inferior, and inferoseptal myocardium. Doppler parameters are   consistent with abnormal left ventricular relaxation (grade 1   diastolic dysfunction). Doppler parameters are consistent with   high ventricular filling pressure. - Aortic valve: Transvalvular velocity was within the normal range.   There was no stenosis. There was no regurgitation. - Mitral valve: Transvalvular velocity was within the normal range.   There was no evidence for stenosis. There was trivial   regurgitation. - Left atrium: The atrium was moderately dilated. - Right ventricle: The cavity size was mildly dilated. Wall   thickness was normal. Systolic function was normal. - Atrial septum: No defect or patent foramen ovale was identified. - Tricuspid valve: There was no regurgitation.  Nuclear StressTest June 2019:  Nuclear stress EF: 29%.  Defect 1: There is a large defect of severe severity present in the basal inferoseptal, basal inferior, mid inferoseptal, mid inferior, apical anterior, apical septal, apical inferior, apical lateral and apex location.  This is a high risk study.  Findings consistent with prior myocardial infarction.  The left ventricular ejection fraction is severely decreased (<30%).   High risk study due to a very large old scar and severely reduced left ventricular systolic function. No reversible ischemia is seen.  EKG:  EKG is not ordered today. The ekg ordered today demonstrates  Recent Labs: No results found for requested labs within last  8760 hours.   Lipid Panel Followed in primary care   Wt Readings from Last 3 Encounters:  10/13/17 156 lb 12.8 oz (71.1 kg)  09/28/17 160 lb (72.6 kg)  09/25/17 160 lb 6.4 oz (72.8 kg)     Other studies Reviewed: Additional studies/  records that were reviewed today include: . Review of the above records demonstrates:    Assessment and Plan:   1. CAD with unstable angina: He has chest pain. His LVEF is around 30% by echo and nuclear stress test which is not changed after review of old studies. Last cath in 2006. He is known to have CAD with inferior MI in in the early 90s and anterior MI in 2000 with recurrent inferior MI in 2006. His RCA stent that was placed in 2006 was found to be occluded by cath later that year. His stress test shows a large scar burden but no ischemia. I don't think his LV systolic function has changed significantly over the past few years but give his increased fatigue, will arrange right and left heart cardiac cath at [facility] on 10/16/17.  I have reviewed the risks, indications, and alternatives to cardiac catheterization, possible angioplasty, and stenting with the patient. Risks include but are not limited to bleeding, infection, vascular injury, stroke, myocardial infection, arrhythmia, kidney injury, radiation-related injury in the case of prolonged fluoroscopy use, emergency cardiac surgery, and death. The patient understands the risks of serious complication is 1-2 in 2355 with diagnostic cardiac cath and 1-2% or less with angioplasty/stenting.  -Will contiinue ASA, beta blocker and statin.    -Hold Xarelto after dose today.  -Pre-cath labs today  2. CARDIOMYOPATHY, ISCHEMIC: LVEF 30% by echo and nuclear stress test. He is on an ARB and a beta blocker. Will continue for now and discuss changing to [facility] at post cath visit.   3. PAD: This had been followed in [facility]. He needs to see DR. Early.  He has no severe leg pain.   4. Atrial fibrillation, paroxysmal: Sinus today. Will continue Xarelto and beta blocker.      5. TOBACCO ABUSE: I have asked him to stop smoking. He is trying to cut back.   6. HTN: BP is controlled. No changes.   7. Carotid artery disease: Mild bilateral disease by dopplers August  2016.    Current medicines are reviewed at length with the patient today.  The patient does not have concerns regarding medicines.  The following changes have been made:  no change  Labs/ tests ordered today include:   Orders Placed This Encounter  Procedures  . Basic Metabolic Panel (BMET)  . CBC    Disposition:   FU with an office APP on my care team in 3 weeks.   Signed, Lauree Chandler, MD 10/13/2017 9:26 AM    [facility] [address], [place], [place]  [address] Phone: [phone]; Fax: [phone]

## 2017-10-12 NOTE — H&P (View-Only) (Signed)
Chief Complaint  Patient presents with  . Follow-up    CAD   History of Present Illness: 72 yo male with history of COPD, atrial fibrillation, CAD, ischemic cardiomyopathy, HTN, hyperlipidemia, ongoing tobacco abuse and PAD who is here today for follow up. His PV issues are followed by Dr. Donnetta Hutching in [facility].  He has had bladder cancer and multiple surgeries on his bladder. Last carotid artery dopplers August 2016 with mild bilateral disease. He is known to have CAD with inferior MI in 1992, PTCA of RCA at that time. He had an anterior MI in 2000 and had a stent placed in the LAD. In 2006, he had an inferior MI with Taxus stent placed in the RCA. In September 2006, repeat cath with occlusion of RCA in the stent. 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 Echo 2017 with stable mild LV systolic dysfunction, WIOX=73-53% (previous 35-40%). Nuclear stress test September 2017 with no ischemia but LVEF of 23%. He was seen here in our office 03/04/16 by Truitt Merle, NP with c/o dizziness, cough, weakness and was found to be in atrial fibrillation with slow rate. Cardiac monitor Feb 2018 with NSVT. He was seen in EP clinic by Dr. Rayann Heman. Beta blocker changed to Coreg and Xarelto was started. Dizziness resolved when Lasix was stopped. He was seen in our office 09/25/17 by Cecilie Kicks, NP and c/o dyspnea and fatigue. He also noted lack of appetite and 20 lb weight loss. He also reported soreness in the muscles of his chest, not like prior cardiac pain. Echo 10/06/17 with LVEF of 30% with hypokinesis of the inferolateral, inferior and inferoseptal walls. No significant valve disease noted. Nuclear stress test was identical to stress test in 2017 showing large scar in the inferior and apical walls with LVEF 29%. Of note, CT scan in 2017 in [facility] with evidence of emphysema but no masses or suspicious nodules.   He is here today for follow up. The patient denies any exertional chest  pain. He has chronic dyspnea. He has more fatigue. No palpitations, lower extremity edema, orthopnea, PND, dizziness, near syncope or syncope. He has lost 20 lbs over the past few months. Still smoking.   Primary Care Physician: Nicoletta Dress, MD Sherlyn Lees, FNP-BC)  Past Medical History:  Diagnosis Date  . Bladder cancer ([place])    resection x3  . CHF (congestive heart failure) ([facility])    class II/III (systolic )  . Cigarette smoker   . Coronary artery disease    status post DMI RX Taxus stent RCA 2006 with susequent Stent LAD and subsequent  stent thrombosis RCA unable to be opened 2006 -neg mv 10/2008  . Diabetes mellitus   . Hyperlipidemia   . Hypertension   . Ischemic cardiomyopathy    ejection fraction of 40-45%  . Lumbar spinal stenosis   . Myocardial infarction ([place])   . NSVT (nonsustained ventricular tachycardia) ([place])   . Paroxysmal atrial fibrillation (H[facility])   . PVD (peripheral vascular disease) ([place])    status post bilateral aortobifemoral bypass in1992 with recent fem to fembypass April  2011 per DR. Early  . S/P lumbar microdiscectomy    12/31/08    Past Surgical History:  Procedure Laterality Date  . Aorto-bifemoral bypass graft surgery with right fem-fem bypass April 2011]  1992 then 2011  . APPENDECTOMY    . left shoulder surgery    . LUMBAR MICRODISCECTOMY  12/31/08  .  taxus stent     placed into his right coronary artery; stent placed to the LAD.      Current Outpatient Medications  Medication Sig Dispense Refill  . albuterol (PROVENTIL HFA;VENTOLIN HFA) 108 (90 BASE) MCG/ACT inhaler Inhale 2 puffs into the lungs as needed (Use as directed).     [name] aspirin 81 MG tablet Take 81 mg by mouth daily.      . carvedilol (COREG) 12.5 MG tablet Take 1 tablet (12.5 mg total) by mouth 2 (two) times daily. 180 tablet 2  . cholecalciferol (VITAMIN D) 1000 UNITS tablet Take 1,000 Units by mouth daily.      . fluticasone (FLONASE) 50 MCG/ACT nasal spray Place 2 sprays  into both nostrils daily as needed for allergies.     [name] gabapentin (NEURONTIN) 400 MG capsule Take 200 mg by mouth 2 (two) times daily.     [name] HYDROcodone-acetaminophen (VICODIN) 5-500 MG per tablet Take 1 tablet by mouth every 6 (six) hours as needed for pain (back/hips).     . losartan (COZAAR) 100 MG tablet TAKE 1 TABLET BY MOUTH ONCE DAILY 90 tablet 3  . Melatonin 10 MG CAPS Take 1 tablet by mouth at bedtime.     . pravastatin (PRAVACHOL) 80 MG tablet TAKE 1 TABLET BY MOUTH ONCE DAILY 90 tablet 3  . rivaroxaban (XARELTO) 20 MG TABS tablet TAKE 1 TABLET BY MOUTH ONCE DAILY WITH SUPPER. 90 tablet 3  . tamsulosin (FLOMAX) 0.4 MG CAPS capsule Take 1 capsule by mouth daily as needed. Take as directed    . trimethoprim-polymyxin b (POLYTRIM) ophthalmic solution Place 1 drop into both eyes 3 times/day as needed-between meals & bedtime (moisture).     [name] VICTOZA 18 MG/3ML SOLN injection 1.5 mg as directed. Once daily     No current facility-administered medications for this visit.     Allergies  Allergen Reactions  . Lisinopril Swelling and Rash    Rash - face and tounge swell    Social History   Socioeconomic History  . Marital status: Married    Spouse name: Not on file  . Number of children: 1  . Years of education: Not on file  . Highest education level: Not on file  Occupational History  . 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 Financial resource strain: Not on file  . Food insecurity:    Worry: Not on file    Inability: Not on file  . Transportation needs:    Medical: Not on file    Non-medical: Not on file  Tobacco Use  . Smoking status: Current Every Day Smoker    Packs/day: 1.00    Years: 50.00    Pack years: 50.00    Types: Cigars  . Smokeless tobacco: Never Used  Substance and Sexual Activity  . Alcohol use: Yes    Comment: occ  . Drug use: No  . Sexual activity: Not on file  Lifestyle  . Physical activity:    Days  per week: Not on file    Minutes per session: Not on file  . Stress: Not on file  Relationships  . Social connections:    Talks on phone: Not on file    Gets together: Not on file    Attends religious service: Not on file    Active member of club or organization: Not on file    Attends meetings of clubs or organizations: Not on  file    Relationship status: Not on file  . Intimate partner violence:    Fear of current or ex partner: Not on file    Emotionally abused: Not on file    Physically abused: Not on file    Forced sexual activity: Not on file  Other Topics Concern  . Not on file  Social History Narrative   Lives with girlfriend in [place].      Family History  Problem Relation Age of Onset  . Coronary artery disease Unknown   . Diabetes Unknown   . Cardiomyopathy Mother   . Heart disease Mother   . Hyperlipidemia Mother   . Coronary artery disease Father   . Stroke Father   . Diabetes Father   . Heart disease Father        before age 65  . Hyperlipidemia Father   . Heart attack Father   . Diabetes Sister   . Heart disease Sister        before age 50  . Hyperlipidemia Sister   . Heart attack Sister     Review of Systems:  As stated in the HPI and otherwise negative.   BP 128/68   Pulse (!) 53   Ht 5\' 8"  (1.727 m)   Wt 156 lb 12.8 oz (71.1 kg)   SpO2 98%   BMI 23.84 kg/m   Physical Examination:  General: Well developed, well nourished, NAD  HEENT: OP clear, mucus membranes moist  SKIN: warm, dry. No rashes. Neuro: No focal deficits  Musculoskeletal: Muscle strength 5/5 all ext  Psychiatric: Mood and affect normal  Neck: No JVD, no carotid bruits, no thyromegaly, no lymphadenopathy.  Lungs:Clear bilaterally, no wheezes, rhonci, crackles Cardiovascular: Regular rate and rhythm. No murmurs, gallops or rubs. Abdomen:Soft. Bowel sounds present. Non-tender.  Extremities: No lower extremity edema. Pulses are 2 + in the bilateral DP/PT.  Echo June  2019: - Left ventricle: The cavity size was mildly dilated. Systolic   function was severely reduced. The estimated ejection fraction   was in the range of 25% to 30%. Hypokinesis of the inferolateral,   inferior, and inferoseptal myocardium. Doppler parameters are   consistent with abnormal left ventricular relaxation (grade 1   diastolic dysfunction). Doppler parameters are consistent with   high ventricular filling pressure. - Aortic valve: Transvalvular velocity was within the normal range.   There was no stenosis. There was no regurgitation. - Mitral valve: Transvalvular velocity was within the normal range.   There was no evidence for stenosis. There was trivial   regurgitation. - Left atrium: The atrium was moderately dilated. - Right ventricle: The cavity size was mildly dilated. Wall   thickness was normal. Systolic function was normal. - Atrial septum: No defect or patent foramen ovale was identified. - Tricuspid valve: There was no regurgitation.  Nuclear StressTest June 2019:  Nuclear stress EF: 29%.  Defect 1: There is a large defect of severe severity present in the basal inferoseptal, basal inferior, mid inferoseptal, mid inferior, apical anterior, apical septal, apical inferior, apical lateral and apex location.  This is a high risk study.  Findings consistent with prior myocardial infarction.  The left ventricular ejection fraction is severely decreased (<30%).   High risk study due to a very large old scar and severely reduced left ventricular systolic function. No reversible ischemia is seen.  EKG:  EKG is not ordered today. The ekg ordered today demonstrates  Recent Labs: No results found for requested labs within last  8760 hours.   Lipid Panel Followed in primary care   Wt Readings from Last 3 Encounters:  10/13/17 156 lb 12.8 oz (71.1 kg)  09/28/17 160 lb (72.6 kg)  09/25/17 160 lb 6.4 oz (72.8 kg)     Other studies Reviewed: Additional studies/  records that were reviewed today include: . Review of the above records demonstrates:    Assessment and Plan:   1. CAD with unstable angina: He has chest pain. His LVEF is around 30% by echo and nuclear stress test which is not changed after review of old studies. Last cath in 2006. He is known to have CAD with inferior MI in in the early 90s and anterior MI in 2000 with recurrent inferior MI in 2006. His RCA stent that was placed in 2006 was found to be occluded by cath later that year. His stress test shows a large scar burden but no ischemia. I don't think his LV systolic function has changed significantly over the past few years but give his increased fatigue, will arrange right and left heart cardiac cath at [facility] on 10/16/17.  I have reviewed the risks, indications, and alternatives to cardiac catheterization, possible angioplasty, and stenting with the patient. Risks include but are not limited to bleeding, infection, vascular injury, stroke, myocardial infection, arrhythmia, kidney injury, radiation-related injury in the case of prolonged fluoroscopy use, emergency cardiac surgery, and death. The patient understands the risks of serious complication is 1-2 in 6754 with diagnostic cardiac cath and 1-2% or less with angioplasty/stenting.  -Will contiinue ASA, beta blocker and statin.    -Hold Xarelto after dose today.  -Pre-cath labs today  2. CARDIOMYOPATHY, ISCHEMIC: LVEF 30% by echo and nuclear stress test. He is on an ARB and a beta blocker. Will continue for now and discuss changing to [facility] at post cath visit.   3. PAD: This had been followed in [facility]. He needs to see DR. Early.  He has no severe leg pain.   4. Atrial fibrillation, paroxysmal: Sinus today. Will continue Xarelto and beta blocker.      5. TOBACCO ABUSE: I have asked him to stop smoking. He is trying to cut back.   6. HTN: BP is controlled. No changes.   7. Carotid artery disease: Mild bilateral disease by dopplers August  2016.    Current medicines are reviewed at length with the patient today.  The patient does not have concerns regarding medicines.  The following changes have been made:  no change  Labs/ tests ordered today include:   Orders Placed This Encounter  Procedures  . Basic Metabolic Panel (BMET)  . CBC    Disposition:   FU with an office APP on my care team in 3 weeks.   Signed, Lauree Chandler, MD 10/13/2017 9:26 AM    [facility] [place], [place], [place]  [address] Phone: [phone]; Fax: [phone]

## 2017-10-13 ENCOUNTER — Encounter: Payer: Self-pay | Admitting: Cardiovascular Disease

## 2017-10-13 ENCOUNTER — Ambulatory Visit: Payer: PPO | Admitting: Cardiovascular Disease

## 2017-10-13 VITALS — BP 128/68 | HR 53 | Ht 68.0 in | Wt 156.8 lb

## 2017-10-13 DIAGNOSIS — I255 Ischemic cardiomyopathy: Secondary | ICD-10-CM | POA: Diagnosis not present

## 2017-10-13 DIAGNOSIS — I25110 Atherosclerotic heart disease of native coronary artery with unstable angina pectoris: Secondary | ICD-10-CM

## 2017-10-13 DIAGNOSIS — I6523 Occlusion and stenosis of bilateral carotid arteries: Secondary | ICD-10-CM | POA: Diagnosis not present

## 2017-10-13 DIAGNOSIS — I739 Peripheral vascular disease, unspecified: Secondary | ICD-10-CM

## 2017-10-13 DIAGNOSIS — I10 Essential (primary) hypertension: Secondary | ICD-10-CM | POA: Diagnosis not present

## 2017-10-13 DIAGNOSIS — I480 Paroxysmal atrial fibrillation: Secondary | ICD-10-CM

## 2017-10-13 LAB — CBC
HEMATOCRIT: 44.5 % (ref 37.5–51.0)
HEMOGLOBIN: 15.3 g/dL (ref 13.0–17.7)
MCH: 32 pg (ref 26.6–33.0)
MCHC: 34.4 g/dL (ref 31.5–35.7)
MCV: 93 fL (ref 79–97)
PLATELETS: 223 10*3/uL (ref 150–450)
RBC: 4.78 x10E6/uL (ref 4.14–5.80)
RDW: 13.7 % (ref 12.3–15.4)
WBC: 7.3 10*3/uL (ref 3.4–10.8)

## 2017-10-13 LAB — BASIC METABOLIC PANEL
BUN / CREAT RATIO: 19 (ref 10–24)
BUN: 19 mg/dL (ref 8–27)
CALCIUM: 10.1 mg/dL (ref 8.6–10.2)
CHLORIDE: 104 mmol/L (ref 96–106)
CO2: 23 mmol/L (ref 20–29)
CREATININE: 0.98 mg/dL (ref 0.76–1.27)
GFR calc Af Amer: 89 mL/min/{1.73_m2} (ref >59)
GFR calc non Af Amer: 77 mL/min/{1.73_m2} (ref >59)
GLUCOSE: 132 mg/dL — AB (ref 65–99)
Potassium: 4.6 mmol/L (ref 3.5–5.2)
Sodium: 143 mmol/L (ref 134–144)

## 2017-10-13 NOTE — Patient Instructions (Signed)
Medication Instructions:  Your physician recommends that you continue on your current medications as directed. Please refer to the Current Medication list given to you today.   Labwork: Lab work to be done today--BMP and CBC  Testing/Procedures: Your physician has requested that you have a cardiac catheterization. Cardiac catheterization is used to diagnose and/or treat various heart conditions. Doctors may recommend this procedure for a number of different reasons. The most common reason is to evaluate chest pain. Chest pain can be a symptom of coronary artery disease (CAD), and cardiac catheterization can show whether plaque is narrowing or blocking your heart's arteries. This procedure is also used to evaluate the valves, as well as measure the blood flow and oxygen levels in different parts of your heart. For further information please visit [email or web address]. Please follow instruction sheet, as given.  Scheduled for July 1,2019  Follow-Up: Your physician recommends that you schedule a follow-up appointment in: about 3 weeks with PA or NP.     [facility] [address], [address] [place] [address] Dept: [phone] Loc: [place]  10/13/2017  You are scheduled for a Cardiac Catheterization on Monday, July 1 with Dr. Lauree Chandler.  1. Please arrive at the [facility] (Main Entrance A) at [facility]: [address] [place], [place] [address] at 5:30 AM (two hours before your procedure to ensure your preparation). Free valet parking service is available.   Special note: Every effort is made to have your procedure done on time. Please understand that emergencies sometimes delay scheduled procedures.  2. Diet: No solid food after midnight prior to procedure. May have clear liquids until 5 AM day of procedure.   3. Labs: Lab work done in office on June  28,2019  4. Medication instructions in preparation for your procedure:   Stop taking Xarelto after evening dose tonight (June 28)   On the morning of your procedure, take your Aspirin and any morning medicines NOT listed above.  You may use sips of water.  5. Plan for one night stay--bring personal belongings. 6. Bring a current list of your medications and current insurance cards. 7. You MUST have a responsible person to drive you home. 8. Someone MUST be with you the first 24 hours after you arrive home or your discharge will be delayed. 9. Please wear clothes that are easy to get on and off and wear slip-on shoes.  Thank you for allowing [place] to care for you!   -- [facility]   Any Other Special Instructions Will Be Listed Below (If Applicable).     If you need a refill on your cardiac medications before your next appointment, please call your pharmacy.

## 2017-10-16 ENCOUNTER — Ambulatory Visit (HOSPITAL_COMMUNITY)
Admission: RE | Admit: 2017-10-16 | Discharge: 2017-10-17 | Disposition: A | Discharge status: Home / Self Care | Payer: PPO | Source: Ambulatory Visit | Attending: Cardiovascular Disease | Admitting: Cardiovascular Disease

## 2017-10-16 ENCOUNTER — Ambulatory Visit (HOSPITAL_COMMUNITY)
Admission: RE | Disposition: A | Discharge status: Home / Self Care | Payer: Self-pay | Source: Ambulatory Visit | Attending: Cardiovascular Disease

## 2017-10-16 ENCOUNTER — Encounter (HOSPITAL_COMMUNITY): Payer: Self-pay | Admitting: Cardiovascular Disease

## 2017-10-16 ENCOUNTER — Other Ambulatory Visit: Payer: Self-pay

## 2017-10-16 DIAGNOSIS — Z7982 Long term (current) use of aspirin: Secondary | ICD-10-CM | POA: Insufficient documentation

## 2017-10-16 DIAGNOSIS — Z888 Allergy status to other drugs, medicaments and biological substances status: Secondary | ICD-10-CM | POA: Insufficient documentation

## 2017-10-16 DIAGNOSIS — J449 Chronic obstructive pulmonary disease, unspecified: Secondary | ICD-10-CM | POA: Insufficient documentation

## 2017-10-16 DIAGNOSIS — E785 Hyperlipidemia, unspecified: Secondary | ICD-10-CM | POA: Diagnosis not present

## 2017-10-16 DIAGNOSIS — I200 Unstable angina: Secondary | ICD-10-CM

## 2017-10-16 DIAGNOSIS — Z79899 Other long term (current) drug therapy: Secondary | ICD-10-CM | POA: Insufficient documentation

## 2017-10-16 DIAGNOSIS — Z794 Long term (current) use of insulin: Secondary | ICD-10-CM | POA: Insufficient documentation

## 2017-10-16 DIAGNOSIS — E1151 Type 2 diabetes mellitus with diabetic peripheral angiopathy without gangrene: Secondary | ICD-10-CM | POA: Insufficient documentation

## 2017-10-16 DIAGNOSIS — Z833 Family history of diabetes mellitus: Secondary | ICD-10-CM | POA: Insufficient documentation

## 2017-10-16 DIAGNOSIS — F17210 Nicotine dependence, cigarettes, uncomplicated: Secondary | ICD-10-CM | POA: Diagnosis not present

## 2017-10-16 DIAGNOSIS — Z823 Family history of stroke: Secondary | ICD-10-CM | POA: Diagnosis not present

## 2017-10-16 DIAGNOSIS — I252 Old myocardial infarction: Secondary | ICD-10-CM | POA: Diagnosis not present

## 2017-10-16 DIAGNOSIS — M48061 Spinal stenosis, lumbar region without neurogenic claudication: Secondary | ICD-10-CM | POA: Diagnosis not present

## 2017-10-16 DIAGNOSIS — I771 Stricture of artery: Secondary | ICD-10-CM | POA: Diagnosis not present

## 2017-10-16 DIAGNOSIS — I5020 Unspecified systolic (congestive) heart failure: Secondary | ICD-10-CM | POA: Insufficient documentation

## 2017-10-16 DIAGNOSIS — Z7901 Long term (current) use of anticoagulants: Secondary | ICD-10-CM | POA: Diagnosis not present

## 2017-10-16 DIAGNOSIS — Z7951 Long term (current) use of inhaled steroids: Secondary | ICD-10-CM | POA: Insufficient documentation

## 2017-10-16 DIAGNOSIS — I472 Ventricular tachycardia: Secondary | ICD-10-CM | POA: Insufficient documentation

## 2017-10-16 DIAGNOSIS — I110 Hypertensive heart disease with heart failure: Secondary | ICD-10-CM | POA: Diagnosis not present

## 2017-10-16 DIAGNOSIS — I255 Ischemic cardiomyopathy: Secondary | ICD-10-CM | POA: Diagnosis not present

## 2017-10-16 DIAGNOSIS — I480 Paroxysmal atrial fibrillation: Secondary | ICD-10-CM | POA: Insufficient documentation

## 2017-10-16 DIAGNOSIS — Z8249 Family history of ischemic heart disease and other diseases of the circulatory system: Secondary | ICD-10-CM | POA: Insufficient documentation

## 2017-10-16 DIAGNOSIS — Z955 Presence of coronary angioplasty implant and graft: Secondary | ICD-10-CM | POA: Diagnosis not present

## 2017-10-16 DIAGNOSIS — Z8551 Personal history of malignant neoplasm of bladder: Secondary | ICD-10-CM | POA: Insufficient documentation

## 2017-10-16 DIAGNOSIS — I25110 Atherosclerotic heart disease of native coronary artery with unstable angina pectoris: Secondary | ICD-10-CM | POA: Diagnosis not present

## 2017-10-16 HISTORY — DX: Type 2 diabetes mellitus without complications: E11.9

## 2017-10-16 HISTORY — PX: RIGHT/LEFT HEART CATH AND CORONARY ANGIOGRAPHY: CATH118266

## 2017-10-16 HISTORY — DX: Unstable angina: I20.0

## 2017-10-16 HISTORY — PX: CORONARY STENT INTERVENTION: CATH118234

## 2017-10-16 LAB — POCT I-STAT 3, ART BLOOD GAS (G3+)
Acid-base deficit: 5 mmol/L — ABNORMAL HIGH (ref 0.0–2.0)
BICARBONATE: 21.3 mmol/L (ref 20.0–28.0)
O2 Saturation: 98 %
PCO2 ART: 42 mmHg (ref 32.0–48.0)
PH ART: 7.313 — AB (ref 7.350–7.450)
PO2 ART: 109 mmHg — AB (ref 83.0–108.0)
TCO2: 23 mmol/L (ref 22–32)

## 2017-10-16 LAB — POCT I-STAT 3, VENOUS BLOOD GAS (G3P V)
Acid-base deficit: 5 mmol/L — ABNORMAL HIGH (ref 0.0–2.0)
BICARBONATE: 20.8 mmol/L (ref 20.0–28.0)
O2 Saturation: 71 %
PCO2 VEN: 40.5 mmHg — AB (ref 44.0–60.0)
PH VEN: 7.317 (ref 7.250–7.430)
PO2 VEN: 40 mmHg (ref 32.0–45.0)
TCO2: 22 mmol/L (ref 22–32)

## 2017-10-16 LAB — POCT ACTIVATED CLOTTING TIME
ACTIVATED CLOTTING TIME: 417 s
ACTIVATED CLOTTING TIME: 153 s
Activated Clotting Time: >1000 seconds

## 2017-10-16 LAB — GLUCOSE, CAPILLARY
GLUCOSE-CAPILLARY: 168 mg/dL — AB (ref 70–99)
Glucose-Capillary: 221 mg/dL — ABNORMAL HIGH (ref 70–99)
Glucose-Capillary: 92 mg/dL (ref 70–99)

## 2017-10-16 SURGERY — RIGHT/LEFT HEART CATH AND CORONARY ANGIOGRAPHY
Anesthesia: LOCAL

## 2017-10-16 MED ORDER — HEPARIN SODIUM (PORCINE) 1000 UNIT/ML IJ SOLN
INTRAMUSCULAR | Status: DC | PRN
  Administered 2017-10-16: 10000 [IU] via INTRAVENOUS

## 2017-10-16 MED ORDER — SODIUM CHLORIDE 0.9 % IV SOLN: INTRAVENOUS | Status: AC

## 2017-10-16 MED ORDER — FAMOTIDINE IN NACL 20-0.9 MG/50ML-% IV SOLN
INTRAVENOUS | Status: AC
  Filled 2017-10-16: qty 50

## 2017-10-16 MED ORDER — FENTANYL CITRATE (PF) 100 MCG/2ML IJ SOLN
INTRAMUSCULAR | Status: AC
  Filled 2017-10-16: qty 2

## 2017-10-16 MED ORDER — ASPIRIN 81 MG PO CHEW
CHEWABLE_TABLET | ORAL | Status: AC
  Administered 2017-10-16: 81 mg via ORAL
  Filled 2017-10-16: qty 1

## 2017-10-16 MED ORDER — INSULIN ASPART 100 UNIT/ML ~~LOC~~ SOLN: 0.0000 [IU] | Freq: Three times a day (TID) | SUBCUTANEOUS | Status: DC

## 2017-10-16 MED ORDER — LABETALOL HCL 5 MG/ML IV SOLN: 10.0000 mg | INTRAVENOUS | Status: AC | PRN

## 2017-10-16 MED ORDER — HYDRALAZINE HCL 20 MG/ML IJ SOLN: 5.0000 mg | INTRAMUSCULAR | Status: AC | PRN

## 2017-10-16 MED ORDER — NITROGLYCERIN 1 MG/10 ML FOR IR/CATH LAB
INTRA_ARTERIAL | Status: AC
  Filled 2017-10-16: qty 10

## 2017-10-16 MED ORDER — MIDAZOLAM HCL 2 MG/2ML IJ SOLN
INTRAMUSCULAR | Status: AC
  Filled 2017-10-16: qty 2

## 2017-10-16 MED ORDER — LOSARTAN POTASSIUM 50 MG PO TABS
100.0000 mg | ORAL_TABLET | Freq: Every day | ORAL | Status: DC
  Administered 2017-10-16 – 2017-10-17 (×2): 100 mg via ORAL
  Filled 2017-10-16 (×2): qty 2

## 2017-10-16 MED ORDER — HEPARIN (PORCINE) IN NACL 1000-0.9 UT/500ML-% IV SOLN
INTRAVENOUS | Status: AC
  Filled 2017-10-16: qty 1000

## 2017-10-16 MED ORDER — ANGIOPLASTY BOOK
Freq: Once | Status: AC
  Administered 2017-10-16: 21:00:00 1
  Filled 2017-10-16: qty 1

## 2017-10-16 MED ORDER — CLOPIDOGREL BISULFATE 300 MG PO TABS
ORAL_TABLET | ORAL | Status: DC | PRN
  Administered 2017-10-16: 600 mg via ORAL

## 2017-10-16 MED ORDER — IOPAMIDOL (ISOVUE-370) INJECTION 76%
INTRAVENOUS | Status: DC | PRN
  Administered 2017-10-16: 115 mL via INTRA_ARTERIAL

## 2017-10-16 MED ORDER — POLYMYXIN B-TRIMETHOPRIM 10000-0.1 UNIT/ML-% OP SOLN
2.0000 [drp] | Freq: Three times a day (TID) | OPHTHALMIC | Status: DC
  Administered 2017-10-16 – 2017-10-17 (×4): 2 [drp] via OPHTHALMIC
  Filled 2017-10-16: qty 10

## 2017-10-16 MED ORDER — HEPARIN (PORCINE) IN NACL 2-0.9 UNITS/ML
INTRAMUSCULAR | Status: AC | PRN
  Administered 2017-10-16 (×2): 500 mL

## 2017-10-16 MED ORDER — ASPIRIN 81 MG PO CHEW
81.0000 mg | CHEWABLE_TABLET | ORAL | Status: AC
  Administered 2017-10-16: 81 mg via ORAL

## 2017-10-16 MED ORDER — LORATADINE 10 MG PO TABS
10.0000 mg | ORAL_TABLET | Freq: Two times a day (BID) | ORAL | Status: DC
  Administered 2017-10-16 – 2017-10-17 (×3): 10 mg via ORAL
  Filled 2017-10-16 (×3): qty 1

## 2017-10-16 MED ORDER — IOPAMIDOL (ISOVUE-370) INJECTION 76%
INTRAVENOUS | Status: AC
  Filled 2017-10-16: qty 100

## 2017-10-16 MED ORDER — MELATONIN 3 MG PO TABS
9.0000 mg | ORAL_TABLET | Freq: Every day | ORAL | Status: DC
  Administered 2017-10-16: 9 mg via ORAL
  Filled 2017-10-16: qty 3

## 2017-10-16 MED ORDER — LIDOCAINE HCL (PF) 1 % IJ SOLN
INTRAMUSCULAR | Status: DC | PRN
  Administered 2017-10-16 (×2): 2 mL via INTRADERMAL

## 2017-10-16 MED ORDER — HEPARIN SODIUM (PORCINE) 1000 UNIT/ML IJ SOLN
INTRAMUSCULAR | Status: AC
  Filled 2017-10-16: qty 1

## 2017-10-16 MED ORDER — VERAPAMIL HCL 2.5 MG/ML IV SOLN
INTRAVENOUS | Status: AC
  Filled 2017-10-16: qty 2

## 2017-10-16 MED ORDER — FLUTICASONE PROPIONATE 50 MCG/ACT NA SUSP: 2.0000 | Freq: Every day | NASAL | Status: DC | PRN

## 2017-10-16 MED ORDER — GABAPENTIN 400 MG PO CAPS: 400.0000 mg | ORAL_CAPSULE | Freq: Two times a day (BID) | ORAL | Status: DC

## 2017-10-16 MED ORDER — LIRAGLUTIDE 18 MG/3ML ~~LOC~~ SOPN: 1.2000 mg | PEN_INJECTOR | Freq: Every day | SUBCUTANEOUS | Status: DC

## 2017-10-16 MED ORDER — ENSURE ENLIVE PO LIQD
237.0000 mL | Freq: Two times a day (BID) | ORAL | Status: DC
  Administered 2017-10-17: 237 mL via ORAL
  Filled 2017-10-16 (×5): qty 237

## 2017-10-16 MED ORDER — MIDAZOLAM HCL 2 MG/2ML IJ SOLN
INTRAMUSCULAR | Status: DC | PRN
  Administered 2017-10-16: 2 mg via INTRAVENOUS

## 2017-10-16 MED ORDER — FAMOTIDINE IN NACL 20-0.9 MG/50ML-% IV SOLN
INTRAVENOUS | Status: AC | PRN
  Administered 2017-10-16: 20 mg via INTRAVENOUS

## 2017-10-16 MED ORDER — LIDOCAINE HCL (PF) 1 % IJ SOLN
INTRAMUSCULAR | Status: AC
  Filled 2017-10-16: qty 30

## 2017-10-16 MED ORDER — SODIUM CHLORIDE 0.9 % IV SOLN
INTRAVENOUS | Status: DC
  Administered 2017-10-16: 07:00:00 via INTRAVENOUS

## 2017-10-16 MED ORDER — TAMSULOSIN HCL 0.4 MG PO CAPS
0.4000 mg | ORAL_CAPSULE | Freq: Two times a day (BID) | ORAL | Status: DC
  Administered 2017-10-16 – 2017-10-17 (×3): 0.4 mg via ORAL
  Filled 2017-10-16 (×3): qty 1

## 2017-10-16 MED ORDER — ONDANSETRON HCL 4 MG/2ML IJ SOLN: 4.0000 mg | Freq: Four times a day (QID) | INTRAMUSCULAR | Status: DC | PRN

## 2017-10-16 MED ORDER — SODIUM CHLORIDE 0.9 % IV SOLN: 250.0000 mL | INTRAVENOUS | Status: DC | PRN

## 2017-10-16 MED ORDER — FENTANYL CITRATE (PF) 100 MCG/2ML IJ SOLN
INTRAMUSCULAR | Status: DC | PRN
  Administered 2017-10-16 (×2): 25 ug via INTRAVENOUS

## 2017-10-16 MED ORDER — SODIUM CHLORIDE 0.9% FLUSH: 3.0000 mL | INTRAVENOUS | Status: DC | PRN

## 2017-10-16 MED ORDER — VERAPAMIL HCL 2.5 MG/ML IV SOLN
INTRAVENOUS | Status: DC | PRN
  Administered 2017-10-16: 10 mL via INTRA_ARTERIAL

## 2017-10-16 MED ORDER — SODIUM CHLORIDE 0.9% FLUSH
3.0000 mL | Freq: Two times a day (BID) | INTRAVENOUS | Status: DC
  Administered 2017-10-16 (×2): 3 mL via INTRAVENOUS

## 2017-10-16 MED ORDER — ASPIRIN EC 81 MG PO TBEC
81.0000 mg | DELAYED_RELEASE_TABLET | Freq: Every day | ORAL | Status: DC
  Administered 2017-10-17: 09:00:00 81 mg via ORAL
  Filled 2017-10-16: qty 1

## 2017-10-16 MED ORDER — NITROGLYCERIN 1 MG/10 ML FOR IR/CATH LAB
INTRA_ARTERIAL | Status: DC | PRN
  Administered 2017-10-16: 200 ug via INTRACORONARY

## 2017-10-16 MED ORDER — MELATONIN 10 MG PO CAPS: 10.0000 mg | ORAL_CAPSULE | Freq: Every day | ORAL | Status: DC

## 2017-10-16 MED ORDER — PRAVASTATIN SODIUM 40 MG PO TABS
80.0000 mg | ORAL_TABLET | Freq: Every day | ORAL | Status: DC
  Administered 2017-10-16 – 2017-10-17 (×2): 80 mg via ORAL
  Filled 2017-10-16 (×3): qty 2

## 2017-10-16 MED ORDER — MOMETASONE FURO-FORMOTEROL FUM 200-5 MCG/ACT IN AERO
2.0000 | INHALATION_SPRAY | Freq: Two times a day (BID) | RESPIRATORY_TRACT | Status: DC
  Filled 2017-10-16: qty 8.8

## 2017-10-16 MED ORDER — CLOPIDOGREL BISULFATE 300 MG PO TABS
ORAL_TABLET | ORAL | Status: AC
  Filled 2017-10-16: qty 2

## 2017-10-16 MED ORDER — ASPIRIN 81 MG PO TABS: 81.0000 mg | ORAL_TABLET | Freq: Every day | ORAL | Status: DC

## 2017-10-16 MED ORDER — CARVEDILOL 12.5 MG PO TABS
12.5000 mg | ORAL_TABLET | Freq: Two times a day (BID) | ORAL | Status: DC
  Administered 2017-10-16 – 2017-10-17 (×3): 12.5 mg via ORAL
  Filled 2017-10-16 (×3): qty 1

## 2017-10-16 MED ORDER — ACETAMINOPHEN 325 MG PO TABS: 650.0000 mg | ORAL_TABLET | ORAL | Status: DC | PRN

## 2017-10-16 MED ORDER — CLOPIDOGREL BISULFATE 75 MG PO TABS
75.0000 mg | ORAL_TABLET | Freq: Every day | ORAL | Status: DC
  Administered 2017-10-17: 75 mg via ORAL
  Filled 2017-10-16: qty 1

## 2017-10-16 MED ORDER — SODIUM CHLORIDE 0.9% FLUSH: 3.0000 mL | Freq: Two times a day (BID) | INTRAVENOUS | Status: DC

## 2017-10-16 SURGICAL SUPPLY — 21 items
BALLN SAPPHIRE 2.5X15 (BALLOONS) ×2
BALLN SAPPHIRE ~~LOC~~ 3.5X18 (BALLOONS) ×2 IMPLANT
BALLN WOLVERINE 2.50X10 (BALLOONS) ×2
BALLOON SAPPHIRE 2.5X15 (BALLOONS) ×1 IMPLANT
BALLOON WOLVERINE 2.50X10 (BALLOONS) ×1 IMPLANT
CATH 5FR JL3.5 JR4 ANG PIG MP (CATHETERS) ×2 IMPLANT
CATH BALLN WEDGE 5F 110CM (CATHETERS) ×2 IMPLANT
CATH VISTA GUIDE 6FR XBLAD3.5 (CATHETERS) ×2 IMPLANT
DEVICE RAD COMP TR BAND LRG (VASCULAR PRODUCTS) ×2 IMPLANT
GLIDESHEATH SLEND SS 6F .021 (SHEATH) ×2 IMPLANT
GUIDEWIRE .025 260CM (WIRE) ×2 IMPLANT
GUIDEWIRE INQWIRE 1.5J.035X260 (WIRE) ×1 IMPLANT
INQWIRE 1.5J .035X260CM (WIRE) ×2
KIT ENCORE 26 ADVANTAGE (KITS) ×2 IMPLANT
KIT HEART LEFT (KITS) ×2 IMPLANT
PACK CARDIAC CATHETERIZATION (CUSTOM PROCEDURE TRAY) ×2 IMPLANT
SHEATH RAIN 4/5FR (SHEATH) ×2 IMPLANT
STENT SYNERGY DES 3X28 (Permanent Stent) ×2 IMPLANT
TRANSDUCER W/STOPCOCK (MISCELLANEOUS) ×2 IMPLANT
TUBING CIL FLEX 10 FLL-RA (TUBING) ×2 IMPLANT
WIRE COUGAR XT STRL 190CM (WIRE) ×2 IMPLANT

## 2017-10-16 NOTE — Progress Notes (Signed)
Site area: right brachial (venous )   Site Prior to Removal:  Level 0  Pressure Applied For 10 MINUTES    Minutes Beginning at 1240 pm  Manual:   Yes.    Patient Status During Pull:  WNL  Post Pull Groin Site:  Level 0  Post Pull Instructions Given:  Yes.    Post Pull Pulses Present:  Yes.    Dressing Applied:  Yes.    Comments:  Tolerated procedure well

## 2017-10-16 NOTE — Interval H&P Note (Signed)
History and Physical Interval Note:  10/16/2017 7:04 AM  [name]  has presented today for cardiac cath with the diagnosis of CAD with unstable angina. The various methods of treatment have been discussed with the patient and family. After consideration of risks, benefits and other options for treatment, the patient has consented to  Procedure(s): RIGHT/LEFT HEART CATH AND CORONARY ANGIOGRAPHY (N/A) as a surgical intervention .  The patient's history has been reviewed, patient examined, no change in status, stable for surgery.  I have reviewed the patient's chart and labs.  Questions were answered to the patient's satisfaction.    Cath Lab Visit (complete for each Cath Lab visit)  Clinical Evaluation Leading to the Procedure:   ACS: No.  Non-ACS:    Anginal Classification: CCS III  Anti-ischemic medical therapy: Minimal Therapy (1 class of medications)  Non-Invasive Test Results: High-risk stress test findings: cardiac mortality >3%/year  Prior CABG: No previous CABG        Lauree Chandler

## 2017-10-16 NOTE — Progress Notes (Signed)
TRB BAND REMOVAL  LOCATION:    right radial  DEFLATED PER PROTOCOL:    Yes.    TIME BAND OFF / DRESSING APPLIED:    1345   SITE UPON ARRIVAL:    Level 0  SITE AFTER BAND REMOVAL:    Level 0  CIRCULATION SENSATION AND MOVEMENT:    Within Normal Limits   Yes.    COMMENTS:   Tolerated procedure well

## 2017-10-17 ENCOUNTER — Encounter (HOSPITAL_COMMUNITY): Payer: Self-pay | Admitting: Cardiology

## 2017-10-17 DIAGNOSIS — E785 Hyperlipidemia, unspecified: Secondary | ICD-10-CM | POA: Diagnosis not present

## 2017-10-17 DIAGNOSIS — I5020 Unspecified systolic (congestive) heart failure: Secondary | ICD-10-CM | POA: Diagnosis not present

## 2017-10-17 DIAGNOSIS — F17210 Nicotine dependence, cigarettes, uncomplicated: Secondary | ICD-10-CM | POA: Diagnosis not present

## 2017-10-17 DIAGNOSIS — I25110 Atherosclerotic heart disease of native coronary artery with unstable angina pectoris: Secondary | ICD-10-CM | POA: Diagnosis not present

## 2017-10-17 DIAGNOSIS — Z955 Presence of coronary angioplasty implant and graft: Secondary | ICD-10-CM | POA: Diagnosis not present

## 2017-10-17 DIAGNOSIS — I255 Ischemic cardiomyopathy: Secondary | ICD-10-CM | POA: Diagnosis not present

## 2017-10-17 DIAGNOSIS — I771 Stricture of artery: Secondary | ICD-10-CM | POA: Diagnosis not present

## 2017-10-17 DIAGNOSIS — Z8551 Personal history of malignant neoplasm of bladder: Secondary | ICD-10-CM | POA: Diagnosis not present

## 2017-10-17 DIAGNOSIS — M48061 Spinal stenosis, lumbar region without neurogenic claudication: Secondary | ICD-10-CM | POA: Diagnosis not present

## 2017-10-17 DIAGNOSIS — I252 Old myocardial infarction: Secondary | ICD-10-CM | POA: Diagnosis not present

## 2017-10-17 DIAGNOSIS — I110 Hypertensive heart disease with heart failure: Secondary | ICD-10-CM | POA: Diagnosis not present

## 2017-10-17 DIAGNOSIS — I472 Ventricular tachycardia: Secondary | ICD-10-CM | POA: Diagnosis not present

## 2017-10-17 DIAGNOSIS — E1151 Type 2 diabetes mellitus with diabetic peripheral angiopathy without gangrene: Secondary | ICD-10-CM | POA: Diagnosis not present

## 2017-10-17 LAB — BASIC METABOLIC PANEL
Anion gap: 7 (ref 5–15)
BUN: 13 mg/dL (ref 8–23)
CHLORIDE: 107 mmol/L (ref 98–111)
CO2: 26 mmol/L (ref 22–32)
Calcium: 8.9 mg/dL (ref 8.9–10.3)
Creatinine, Ser: 1.05 mg/dL (ref 0.61–1.24)
GFR calc non Af Amer: >60 mL/min (ref >60)
Glucose, Bld: 107 mg/dL — ABNORMAL HIGH (ref 70–99)
POTASSIUM: 4 mmol/L (ref 3.5–5.1)
SODIUM: 140 mmol/L (ref 135–145)

## 2017-10-17 LAB — GLUCOSE, CAPILLARY: Glucose-Capillary: 110 mg/dL — ABNORMAL HIGH (ref 70–99)

## 2017-10-17 LAB — CBC
HEMATOCRIT: 42.1 % (ref 39.0–52.0)
Hemoglobin: 13.5 g/dL (ref 13.0–17.0)
MCH: 31.1 pg (ref 26.0–34.0)
MCHC: 32.1 g/dL (ref 30.0–36.0)
MCV: 97 fL (ref 78.0–100.0)
Platelets: 168 10*3/uL (ref 150–400)
RBC: 4.34 MIL/uL (ref 4.22–5.81)
RDW: 13.7 % (ref 11.5–15.5)
WBC: 6.9 10*3/uL (ref 4.0–10.5)

## 2017-10-17 MED ORDER — NITROGLYCERIN 0.4 MG SL SUBL: 0.4000 mg | SUBLINGUAL_TABLET | SUBLINGUAL | 2 refills | Status: DC | PRN

## 2017-10-17 MED ORDER — CLOPIDOGREL BISULFATE 75 MG PO TABS: 75.0000 mg | ORAL_TABLET | Freq: Every day | ORAL | 1 refills | Status: DC

## 2017-10-17 MED FILL — Heparin Sod (Porcine)-NaCl IV Soln 1000 Unit/500ML-0.9%: INTRAVENOUS | Qty: 500 | Status: AC

## 2017-10-17 NOTE — Discharge Summary (Signed)
Discharge Summary    Patient ID: [name],  MRN: [id number], DOB/AGE: 73-Jul-1947 73 y.o.  Admit date: 10/16/2017 Discharge date: 10/17/2017  Primary Care Provider: Nicoletta Dress Primary Cardiologist: Dr. Angelena Form   Discharge Diagnoses    Active Problems:   Coronary artery disease involving native coronary artery of native heart with unstable angina pectoris (HCC)   Unstable angina (HCC)  Allergies Allergies  Allergen Reactions  . Lisinopril Swelling and Rash    Rash - face and tounge swell    Diagnostic Studies/Procedures    Cath: 10/16/17  Conclusion     Prox RCA lesion is 100% stenosed.  Mid RCA to Dist RCA lesion is 100% stenosed.  Ost LM to Mid LM lesion is 30% stenosed.  Ost LAD to Prox LAD lesion is 90% stenosed.  A drug-eluting stent was successfully placed using a STENT SYNERGY DES [id number].  Post intervention, there is a 0% residual stenosis.  LV end diastolic pressure is normal.   1. Chronic occlusion RCA with distal vessel filling from left to right collaterals.  2. Severe stenosis proximal LAD in the old stented segment (bare metal stent was placed in 2000).  3. Successful PTCA/DES x  Proximal LAD 4. Normal filling pressures  Recommendations: Will continue ASA and Plavix for one month along with Xarelto. Restart Xarelto in am. Will use ASA for only one month then will stop and continue Plavix with Xarelto for one year. Continue beta blocker and statin. Likely d/c home tomorrow am.   _____________   History of Present Illness     He has a history ofatrial fibrillation, CAD, ischemic cardiomyopathy, HTN, hyperlipidemia, ongoing tobacco abuse and PAD who is here today for follow up. His PV issues are followed by Dr. Donnetta Hutching in [facility]. He has had bladder cancer and multiple surgeries on his bladder. Last carotid artery dopplers August 2016 with mild bilateral disease. He is known to have CAD with inferior MI in 1992, PTCA of RCA at that time. He had  an anterior MI in 2000 and had a stent placed in the LAD. In 2006, he had an inferior MI with Taxus stent placed in the RCA. In September 2006, repeat cath with occlusion of RCA in the stent. 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 Echo 2017 with stable mild LV systolic dysfunction, AOZH=08-65% (previous 35-40%). Nuclear stress test September 2017 with no ischemia. He was seen here in our office 03/04/16 by Truitt Merle, NP with c/o dizziness, cough, weakness and was found to be in atrial fibrillation with slow rate. Cardiac monitor Feb 2018 with NSVT. He was seen in EP clinic by Dr. Rayann Heman. Beta blocker changed to Coreg and Xarelto was started. Dizziness resolved when Lasix was stopped.  Pt presented to the office on 09/25/17 for continued fatigue for 3 months, He had a sinus infection 2-3 months prior and was still fatigued.  No cardiac  pain , but did have some "muscular pain across chest" did not feel like his MIs.  His appetite was poor, just not hungry and had lost 20 lbs this year. .     Labs from PCP Cr. 1.04, K+ 4.6, alb 4.1, AST 12, TSH 1.790, Hgb 15.2  Still smokes half a PPD really does not plan to stop. Given his symptoms he was set up for outpt echo and stress test. Echo showed a decline in his EF to 25-30% and stress test was  abnormal. With these findings he was set up for outpatient cardiac cath.   Hospital Course     Underwent cath noted above with Dr. Angelena Form with chronic occlusion of the RCA with distal filling from left to right collaterals. Severe stenosis in the pLAD of old stent (BMS) with successful PTCA/DES to pLAD with normal filling pressures. Plan for triple therapy with ASA, plavix and Xarelto for one month, then plan to stop ASA. Morning labs were stable. No chest pain post cath. Worked well with cardiac rehab.   General: Well developed, well nourished, male appearing in no acute distress. Head: Normocephalic,  atraumatic.  Neck: Supple without bruits, JVD. Lungs:  Resp regular and unlabored, CTA. Heart: RRR, S1, S2, soft systolic murmur; no rub. Abdomen: Soft, non-tender, non-distended with normoactive bowel sounds.  Extremities: No clubbing, cyanosis, edema. Distal pedal pulses are 2+ bilaterally. Right radial cath site stable without bruising or hematoma Neuro: Alert and oriented X 3. Moves all extremities spontaneously. Psych: Normal affect.  [name] was seen by Dr. Tamala Julian and determined stable for discharge home. Follow up in the office has been arranged. Medications are listed below.   _____________  Discharge Vitals Blood pressure 107/68, pulse 75, temperature 98.5 F (36.9 C), resp. rate 17, height 5\' 7"  (1.702 m), weight 154 lb 12.2 oz (70.2 kg), SpO2 95 %.  Filed Weights   10/16/17 0555 10/17/17 0626  Weight: 154 lb (69.9 kg) 154 lb 12.2 oz (70.2 kg)    Labs & Radiologic Studies    CBC Recent Labs    10/17/17 0214  WBC 6.9  HGB 13.5  HCT 42.1  MCV 97.0  PLT 643   Basic Metabolic Panel Recent Labs    10/17/17 0214  NA 140  K 4.0  CL 107  CO2 26  GLUCOSE 107*  BUN 13  CREATININE 1.05  CALCIUM 8.9   Liver Function Tests No results for input(s): AST, ALT, ALKPHOS, BILITOT, PROT, ALBUMIN in the last 72 hours. No results for input(s): LIPASE, AMYLASE in the last 72 hours. Cardiac Enzymes No results for input(s): CKTOTAL, CKMB, CKMBINDEX, TROPONINI in the last 72 hours. BNP Invalid input(s): POCBNP D-Dimer No results for input(s): DDIMER in the last 72 hours. Hemoglobin A1C No results for input(s): HGBA1C in the last 72 hours. Fasting Lipid Panel No results for input(s): CHOL, HDL, LDLCALC, TRIG, CHOLHDL, LDLDIRECT in the last 72 hours. Thyroid Function Tests No results for input(s): TSH, T4TOTAL, T3FREE, THYROIDAB in the last 72 hours.  Invalid input(s): FREET3 _____________  No results found. Disposition   Pt is being discharged home today in good  condition.  Follow-up Plans & Appointments    Follow-up Information    Charlie Pitter, PA-C Follow up on 11/07/2017.   Specialties:  Cardiology, Radiology Why:  at 11:30am for your follow up appt.  Contact information: [address] [place] [address] [phone]          Discharge Instructions    Call MD for:  redness, tenderness, or signs of infection (pain, swelling, redness, odor or green/yellow discharge around incision site)   Complete by:  As directed    Diet - low sodium heart healthy   Complete by:  As directed    Discharge instructions   Complete by:  As directed    Radial Site Care Refer to this sheet in the next few weeks. These instructions provide you with information on caring for yourself after your procedure. Your caregiver may  also give you more specific instructions. Your treatment has been planned according to current medical practices, but problems sometimes occur. Call your caregiver if you have any problems or questions after your procedure. HOME CARE INSTRUCTIONS You may shower the day after the procedure.Remove the bandage (dressing) and gently wash the site with plain soap and water.Gently pat the site dry.  Do not apply powder or lotion to the site.  Do not submerge the affected site in water for 3 to 5 days.  Inspect the site at least twice daily.  Do not flex or bend the affected arm for 24 hours.  No lifting over 5 pounds (2.3 kg) for 5 days after your procedure.  Do not drive home if you are discharged the same day of the procedure. Have someone else drive you.  You may drive 24 hours after the procedure unless otherwise instructed by your caregiver.  What to expect: Any bruising will usually fade within 1 to 2 weeks.  Blood that collects in the tissue (hematoma) may be painful to the touch. It should usually decrease in size and tenderness within 1 to 2 weeks.  SEEK IMMEDIATE MEDICAL CARE IF: You have unusual pain at the  radial site.  You have redness, warmth, swelling, or pain at the radial site.  You have drainage (other than a small amount of blood on the dressing).  You have chills.  You have a fever or persistent symptoms for more than 72 hours.  You have a fever and your symptoms suddenly get worse.  Your arm becomes pale, cool, tingly, or numb.  You have heavy bleeding from the site. Hold pressure on the site.   You will be on triple therapy with aspirin, plavix and xarelto for one month, then plan to stop aspirin and continue plavix and xarelto. Please monitor for signs and symptoms of bleeding with these medications.   Increase activity slowly   Complete by:  As directed      Discharge Medications     Medication List    STOP taking these medications   naproxen sodium 220 MG tablet Commonly known as:  ALEVE     TAKE these medications   aspirin 81 MG tablet Take 81 mg by mouth daily.   budesonide-formoterol 160-4.5 MCG/ACT inhaler Commonly known as:  SYMBICORT Inhale 2 puffs into the lungs 2 (two) times daily.   carvedilol 12.5 MG tablet Commonly known as:  COREG Take 1 tablet (12.5 mg total) by mouth 2 (two) times daily.   cholecalciferol 1000 units tablet Commonly known as:  VITAMIN D Take 1,000 Units by mouth daily.   clopidogrel 75 MG tablet Commonly known as:  PLAVIX Take 1 tablet (75 mg total) by mouth daily with breakfast.   fluticasone 50 MCG/ACT nasal spray Commonly known as:  FLONASE Place 2 sprays into both nostrils daily as needed for allergies.   gabapentin 400 MG capsule Commonly known as:  NEURONTIN Take 400 mg by mouth 2 (two) times daily.   JUICE PLUS FIBRE PO Take 2 tablets by mouth 2 (two) times daily.   loratadine 10 MG tablet Commonly known as:  CLARITIN Take 10 mg by mouth 2 (two) times daily.   losartan 100 MG tablet Commonly known as:  COZAAR TAKE 1 TABLET BY MOUTH ONCE DAILY   Melatonin 10 MG Caps Take 10 mg by mouth at bedtime.    nitroGLYCERIN 0.4 MG SL tablet Commonly known as:  NITROSTAT Place 1 tablet (0.4 mg total) under the  tongue every 5 (five) minutes as needed.   pravastatin 80 MG tablet Commonly known as:  PRAVACHOL TAKE 1 TABLET BY MOUTH ONCE DAILY What changed:    how much to take  how to take this  when to take this   rivaroxaban 20 MG Tabs tablet Commonly known as:  XARELTO TAKE 1 TABLET BY MOUTH ONCE DAILY WITH SUPPER. What changed:    how much to take  how to take this  when to take this  additional instructions   tamsulosin 0.4 MG Caps capsule Commonly known as:  FLOMAX Take 0.4 mg by mouth 2 (two) times daily. Take as directed   trimethoprim-polymyxin b ophthalmic solution Commonly known as:  POLYTRIM Place 2 drops into the right eye 3 (three) times daily.   VICTOZA 18 MG/3ML Sopn Generic drug:  liraglutide Inject 1.2 mg into the skin daily.        Acute coronary syndrome (MI, NSTEMI, STEMI, etc) this admission?: No.     Outstanding Labs/Studies   Follow up echo in 6-8 weeks.   Duration of Discharge Encounter   Greater than 30 minutes including physician time.  Signed, Reino Bellis NP-C 10/17/2017, 8:10 AM

## 2017-10-17 NOTE — Progress Notes (Signed)
CARDIAC REHAB PHASE I   PRE:  Rate/Rhythm: 56 SR with PVCs  BP:  Sitting: 107/68      SaO2: 95 RA  MODE:  Ambulation: 800 ft 96 peak HR  POST:  Rate/Rhythm: 88 SR with PVCs  BP:  Sitting: 157/73    SaO2: 96 RA   Pt ambulated 865ft in hallway independently. No c/o CP or SOB. Pt and wife educated on importance of Plavix, ASA, Xerelto, and NTG. Pt has stent card at bedside. Encouraged pt to quit smoking, tip sheet given, pt refusing fake cigarette. Pt states he's "not planning on quitting". Pt given heart healthy and low sodium diets. Reviewed importance of daily weights and signs and symptoms of heart failure. Reviewed restrictions and exercise guidelines with pt. Will refer to CRP II [place] with knowledge pt is not interested in attending.  [id number] Rufina Falco, RN BSN 10/17/2017 8:37 AM

## 2017-10-18 ENCOUNTER — Telehealth (HOSPITAL_COMMUNITY): Payer: Self-pay

## 2017-10-18 NOTE — Telephone Encounter (Signed)
Referral recv'd, patient is not interesed.  Closed referral

## 2017-10-23 ENCOUNTER — Encounter: Payer: Self-pay | Admitting: Physician Assistant

## 2017-11-06 ENCOUNTER — Encounter: Payer: Self-pay | Admitting: Physician Assistant

## 2017-11-06 ENCOUNTER — Telehealth: Payer: Self-pay | Admitting: Physician Assistant

## 2017-11-06 NOTE — Telephone Encounter (Signed)
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 Wife was under the impression Dr Angelena Form stated could not drive due to weak heart muscle but doesn't want husband to know that wife called . Will forward to [facility] for for review ./cy

## 2017-11-06 NOTE — Progress Notes (Signed)
Cardiology Office Note    Date:  11/07/2017  ID:  [name], DOB [birth year], MRN [id number] PCP:  Nicoletta Dress, MD  Cardiologist:  Lauree Chandler, MD   Chief Complaint: post-cath follow-up  History of Present Illness:  [name] is a 72 y.o. male with history of CAD (multiple PCIs), paroxysmal atrial fibrillation, ischemic cardiomyopathy/chronic systolic CHF, HTN, hyperlipidemia, ongoing tobacco abuse, PAD (prior LE bypass - [facility]), mild carotid disease (2016), bladder CA s/p multiple surgeries who presents for post-hospital follow-up.   He is known to have CAD with inferior MI in 1992, PTCA of RCA at that time. He had an anterior MI in 2000 and had a stent placed in the LAD. In 2006, he had an inferior MI with Taxus stent placed in the RCA. In September 2006, repeat cath with occlusion of RCA in the stent. In 2017 he developed dizziness, cough, weakness and was found to be in atrial fibrillation with slow rate. Cardiac monitor Feb 2018 showed NSVT. He was seen in EP clinic by Dr. Rayann Heman. Beta blocker changed to Coreg and Xarelto was started. Dizziness resolved when Lasix was stopped.He recently saw Cecilie Kicks with fatigue, poor appetite, weight loss, and chest discomfort. Echo 10/06/17 showed decline in EF to 25-30% with hypokinesis of the inferolateral, inferior, and inferoseptal myocardium, grade 1 DD, mod LAE, mildly dilated RV (previously 40-45% in 01/2016). Cardiac cath 10/16/17 showed chronic occlusion of the RCA with distal filling from left to right collaterals, severe stenosis in the pLAD of old stent (BMS) with successful PTCA/DES to pLAD. LVEDP was normal. Dr. Angelena Form recommended triple therapy with ASA, plavix and Xarelto for one month, then plan to stop ASA. EKG appears to have shown NSR with frequent PVCs. Most recent labs showed normal CBC, K 4.0, Cr 1.05. ALT, thyroid normal per [facility] 08/2017.   He returns for follow-up feeling great. He has had no CP, SOB, dizziness,  syncope, edema, orthopnea or weight gain. Tolerating all meds well, wanting to clarify plan for blood thinners. He reports a very remote issue with leg pain with a statin in 1992 but seems to have tolerated pravastatin 80mg  for many years. 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 We discussed that his EF of 25-30% presently disqualifies him from this.  He says I have a twin in [place] named Vicente Males, a family friend.   Past Medical History:  Diagnosis Date  . Bladder cancer ([place])    resection x3  . Chronic systolic CHF (congestive heart failure) ([place])   . Cigarette smoker   . Coronary artery disease    status post DMI RX Taxus stent RCA 2006 with susequent Stent LAD and subsequent  stent thrombosis RCA unable to be opened 2006 -neg mv 10/2008, 10/16/17 ISR to pLAD with PTCA/DES, CTO of RCA with collaterals, EF 25%  . Hyperlipidemia   . Hypertension   . Ischemic cardiomyopathy    ejection fraction of 40-45%  . Lumbar spinal stenosis   . Myocardial infarction ([place])    "I've had 4" (10/16/2017)  . NSVT (nonsustained ventricular tachycardia) ([place])   . Paroxysmal atrial fibrillation (HCC)   . PVC's (premature ventricular contractions)   . PVD (peripheral vascular disease) ([place])    status post bilateral aortobifemoral bypass in1992 with recent fem to fembypass April  2011 per DR. Early  . Type II diabetes mellitus ([place])     Past Surgical History:  Procedure Laterality Date  .  AORTA - BILATERAL FEMORAL ARTERY BYPASS GRAFT Bilateral 1992  . APPENDECTOMY    . BACK SURGERY    . CORONARY ANGIOPLASTY WITH STENT PLACEMENT     taxus stent  placed into his right coronary artery; stent placed to the LAD.    [name] CORONARY STENT INTERVENTION N/A 10/16/2017   Procedure: CORONARY STENT INTERVENTION;  Surgeon: Burnell Blanks, MD;  Location: [place] [facility];  Service: Cardiovascular;  Laterality: N/A;  . FEMORAL-FEMORAL BYPASS GRAFT  07/2009  . LUMBAR  MICRODISCECTOMY  12/31/08  . RIGHT/LEFT HEART CATH AND CORONARY ANGIOGRAPHY N/A 10/16/2017   Procedure: RIGHT/LEFT HEART CATH AND CORONARY ANGIOGRAPHY;  Surgeon: Burnell Blanks, MD;  Location: [facility];  Service: Cardiovascular;  Laterality: N/A;  . SHOULDER OPEN ROTATOR CUFF REPAIR Left     Current Medications: Current Meds  Medication Sig  . aspirin 81 MG tablet Take 81 mg by mouth daily.    . budesonide-formoterol (SYMBICORT) 160-4.5 MCG/ACT inhaler Inhale 2 puffs into the lungs 2 (two) times daily.  . carvedilol (COREG) 12.5 MG tablet Take 1 tablet (12.5 mg total) by mouth 2 (two) times daily.  . cholecalciferol (VITAMIN D) 1000 UNITS tablet Take 1,000 Units by mouth daily.    . clopidogrel (PLAVIX) 75 MG tablet Take 1 tablet (75 mg total) by mouth daily with breakfast.  . fluticasone (FLONASE) 50 MCG/ACT nasal spray Place 2 sprays into both nostrils daily as needed for allergies.   [name] gabapentin (NEURONTIN) 400 MG capsule Take 400 mg by mouth 2 (two) times daily.  [name] loratadine (CLARITIN) 10 MG tablet Take 10 mg by mouth 2 (two) times daily.  [name] losartan (COZAAR) 100 MG tablet TAKE 1 TABLET BY MOUTH ONCE DAILY  . Melatonin 10 MG CAPS Take 10 mg by mouth at bedtime.   . nitroGLYCERIN (NITROSTAT) 0.4 MG SL tablet Place 1 tablet (0.4 mg total) under the tongue every 5 (five) minutes as needed.  . Nutritional Supplements (JUICE PLUS FIBRE PO) Take 2 tablets by mouth 2 (two) times daily.  . pravastatin (PRAVACHOL) 80 MG tablet TAKE 1 TABLET BY MOUTH ONCE DAILY (Patient taking differently: Take 80 mg by mouth once daily in the evening)  . rivaroxaban (XARELTO) 20 MG TABS tablet TAKE 1 TABLET BY MOUTH ONCE DAILY WITH SUPPER. (Patient taking differently: Take 20 mg by mouth daily. )  . tamsulosin (FLOMAX) 0.4 MG CAPS capsule Take 0.4 mg by mouth 2 (two) times daily. Take as directed  . trimethoprim-polymyxin b (POLYTRIM) ophthalmic solution Place 2 drops into the right eye 3 (three)  times daily.  [name] VICTOZA 18 MG/3ML SOLN injection Inject 1.2 mg into the skin daily.     Allergies:   Lisinopril   Social History   Socioeconomic History  . Marital status: Married    Spouse name: Not on file  . Number of children: 1  . Years of education: Not on file  . Highest education level: Not on file  Occupational History  . 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 Financial resource strain: Not on file  . Food insecurity:    Worry: Not on file    Inability: Not on file  . Transportation needs:    Medical: Not on file    Non-medical: Not on file  Tobacco Use  . Smoking status: Current Every Day Smoker    Packs/day: 0.50    Years: 56.00    Pack  years: 28.00    Types: Cigars  . Smokeless tobacco: Former Systems developer    Types: Chew  Substance and Sexual Activity  . Alcohol use: Yes    Comment: 10/16/2017 "1 beer q 2-3 months"  . Drug use: Never  . Sexual activity: Not Currently  Lifestyle  . Physical activity:    Days per week: Not on file    Minutes per session: Not on file  . Stress: Not on file  Relationships  . Social connections:    Talks on phone: Not on file    Gets together: Not on file    Attends religious service: Not on file    Active member of club or organization: Not on file    Attends meetings of clubs or organizations: Not on file    Relationship status: Not on file  Other Topics Concern  . Not on file  Social History Narrative   Lives with girlfriend in [place].       Family History:  The patient's family history includes Cardiomyopathy in his mother; Coronary artery disease in his father and unknown relative; Diabetes in his father, sister, and unknown relative; Heart attack in his father and sister; Heart disease in his father, mother, and sister; Hyperlipidemia in his father, mother, and sister; Stroke in his father.  ROS:   Please see the history of present illness. All other systems are reviewed  and otherwise negative.    PHYSICAL EXAM:   VS:  BP 126/68   Pulse 77   Ht 5\' 7"  (1.702 m)   Wt 160 lb (72.6 kg)   SpO2 96%   BMI 25.06 kg/m   BMI: Body mass index is 25.06 kg/m. GEN: Well nourished, well developed WM, in no acute distress HEENT: normocephalic, atraumatic Neck: no JVD, carotid bruits, or masses Cardiac: RRR; no murmurs, rubs, or gallops, no edema  Respiratory:  clear to auscultation bilaterally, normal work of breathing GI: soft, nontender, nondistended, + BS MS: no deformity or atrophy Skin: warm and dry, no rash. Right radial cath site without hematoma or ecchymosis; good pulse. Neuro:  Alert and Oriented x 3, Strength and sensation are intact, follows commands Psych: euthymic mood, full affect  Wt Readings from Last 3 Encounters:  11/07/17 160 lb (72.6 kg)  10/17/17 154 lb 12.2 oz (70.2 kg)  10/13/17 156 lb 12.8 oz (71.1 kg)      Studies/Labs Reviewed:   EKG:  EKG was ordered today and personally reviewed by me and demonstrates NSR 77bpm, inferior TW changes otherwise nonacute. Stable from prior.  Recent Labs: 10/17/2017: BUN 13; Creatinine, Ser 1.05; Hemoglobin 13.5; Platelets 168; Potassium 4.0; Sodium 140   Lipid Panel    Component Value Date/Time   CHOL 149 11/07/2006 0958   TRIG 101 11/07/2006 0958   HDL 24.4 (L) 11/07/2006 0958   CHOLHDL 6.1 CALC 11/07/2006 0958   VLDL 20 11/07/2006 0958   LDLCALC 104 (H) 11/07/2006 0958    Additional studies/ records that were reviewed today include: Summarized above   ASSESSMENT & PLAN:   1. CAD - doing well s/p PCI. Continue triple therapy except drop ASA after 1 month of completion - I advised last dose of aspirin 11/16/17. Discussed importance of surveillance for bleeding. Continue BB. See below re: statin. 2. Paroxysmal atrial fibrillation - maintaining NSR. 3. Ischemic cardiomyopathy - appears euvolemic. Reviewed 2g sodium restriction, 2L fluid restriction, daily weights with patient. I considered  titration of med regimen today but opted to leave regimen as  is - I do not know that he would qualify for Entresto as he is currently NYHA class I and he had prior issues with dizziness on Lasix so I held off on spironolactone. Would consider checking echo in 3 months at time of follow-up to revisit, as we may need to consider referral to EP if LV dysfunction persists. Given territory of PCI I am hopeful he will have improvement s/p revascularization, at least back to prior baseline. LVEDP wnl at cath. 4. Essential HTN - controlled. 5. Hyperlipidemia - LDL not at goal by last check. Will d/c pravastatin and try rosuvastatin 20mg  daily. Recheck CMET/lipids in 2 months.  Disposition: F/u with Dr. Danella Maiers in 3 months.  Medication Adjustments/Labs and Tests Ordered: Current medicines are reviewed at length with the patient today.  Concerns regarding medicines are outlined above. Medication changes, Labs and Tests ordered today are summarized above and listed in the Patient Instructions accessible in Encounters.   Signed, Charlie Pitter, PA-C  11/07/2017 12:06 PM    [facility] [place], [place], [place]  [address] Phone: [phone]; Fax: [phone]

## 2017-11-06 NOTE — Telephone Encounter (Signed)
New Message   Patient wife is wanting to know if the patient should be driving. She states that  Dr. Burt Knack advised them at the time her husband had his stent placed but she does not think her husband was aware due to he had just had surgery. She thinks that her husband should be made aware of this is the case. Please call to discuss.

## 2017-11-07 ENCOUNTER — Ambulatory Visit (INDEPENDENT_AMBULATORY_CARE_PROVIDER_SITE_OTHER): Payer: PPO | Admitting: Physician Assistant

## 2017-11-07 ENCOUNTER — Encounter: Payer: Self-pay | Admitting: Physician Assistant

## 2017-11-07 VITALS — BP 126/68 | HR 77 | Ht 67.0 in | Wt 160.0 lb

## 2017-11-07 DIAGNOSIS — I493 Ventricular premature depolarization: Secondary | ICD-10-CM | POA: Diagnosis not present

## 2017-11-07 DIAGNOSIS — I480 Paroxysmal atrial fibrillation: Secondary | ICD-10-CM | POA: Diagnosis not present

## 2017-11-07 DIAGNOSIS — I255 Ischemic cardiomyopathy: Secondary | ICD-10-CM

## 2017-11-07 DIAGNOSIS — E785 Hyperlipidemia, unspecified: Secondary | ICD-10-CM | POA: Diagnosis not present

## 2017-11-07 DIAGNOSIS — I2510 Atherosclerotic heart disease of native coronary artery without angina pectoris: Secondary | ICD-10-CM

## 2017-11-07 DIAGNOSIS — I10 Essential (primary) hypertension: Secondary | ICD-10-CM

## 2017-11-07 MED ORDER — ROSUVASTATIN CALCIUM 20 MG PO TABS: 20.0000 mg | ORAL_TABLET | Freq: Every day | ORAL | 3 refills | Status: DC

## 2017-11-07 NOTE — Patient Instructions (Addendum)
Medication Instructions:  Your physician has recommended you make the following change in your medication:  1.  STOP the Pravastatin 2.  START Rosuvastatin 20 mg taking 1 tablet by mouth in the pm   Labwork: 2 MONTHS:  CBC & FASTING LIPID  Testing/Procedures: None ordered  Follow-Up: Your physician recommends that you schedule a follow-up appointment in: [place], PA-C   Any Other Special Instructions Will Be Listed Below (If Applicable).  For patients with congestive heart failure, we give them these special instructions:  1. Follow a low-salt diet - you are allowed no more than 2,000mg  of sodium per day. Watch your fluid intake. In general, you should not be taking in more than 2 liters of fluid per day (no more than 8 glasses per day). This includes sources of water in foods like soup, coffee, tea, milk, etc. 2. Weigh yourself on the same scale at same time of day and keep a log. 3. Call your doctor: (Anytime you feel any of the following symptoms)  - 3lb weight gain overnight or 5lb within a few days - Shortness of breath, with or without a dry hacking cough  - Swelling in the hands, feet or stomach  - If you have to sleep on extra pillows at night in order to breathe   IT IS IMPORTANT TO LET YOUR DOCTOR KNOW EARLY ON IF YOU ARE HAVING SYMPTOMS SO WE CAN HELP YOU!      If you need a refill on your cardiac medications before your next appointment, please call your pharmacy.

## 2017-11-29 DIAGNOSIS — J80 Acute respiratory distress syndrome: Secondary | ICD-10-CM | POA: Diagnosis not present

## 2017-11-29 DIAGNOSIS — R0689 Other abnormalities of breathing: Secondary | ICD-10-CM | POA: Diagnosis not present

## 2017-11-29 DIAGNOSIS — R0602 Shortness of breath: Secondary | ICD-10-CM | POA: Diagnosis not present

## 2017-11-29 DIAGNOSIS — R230 Cyanosis: Secondary | ICD-10-CM | POA: Diagnosis not present

## 2017-11-30 DIAGNOSIS — J449 Chronic obstructive pulmonary disease, unspecified: Secondary | ICD-10-CM | POA: Diagnosis not present

## 2017-11-30 DIAGNOSIS — R0602 Shortness of breath: Secondary | ICD-10-CM | POA: Diagnosis not present

## 2017-12-11 DIAGNOSIS — E559 Vitamin D deficiency, unspecified: Secondary | ICD-10-CM | POA: Diagnosis not present

## 2017-12-11 DIAGNOSIS — Z1339 Encounter for screening examination for other mental health and behavioral disorders: Secondary | ICD-10-CM | POA: Diagnosis not present

## 2017-12-11 DIAGNOSIS — Z6824 Body mass index (BMI) 24.0-24.9, adult: Secondary | ICD-10-CM | POA: Diagnosis not present

## 2017-12-11 DIAGNOSIS — J449 Chronic obstructive pulmonary disease, unspecified: Secondary | ICD-10-CM | POA: Diagnosis not present

## 2017-12-11 DIAGNOSIS — E291 Testicular hypofunction: Secondary | ICD-10-CM | POA: Diagnosis not present

## 2017-12-11 DIAGNOSIS — I1310 Hypertensive heart and chronic kidney disease without heart failure, with stage 1 through stage 4 chronic kidney disease, or unspecified chronic kidney disease: Secondary | ICD-10-CM | POA: Diagnosis not present

## 2017-12-11 DIAGNOSIS — E785 Hyperlipidemia, unspecified: Secondary | ICD-10-CM | POA: Diagnosis not present

## 2017-12-11 DIAGNOSIS — E1122 Type 2 diabetes mellitus with diabetic chronic kidney disease: Secondary | ICD-10-CM | POA: Diagnosis not present

## 2017-12-11 DIAGNOSIS — I480 Paroxysmal atrial fibrillation: Secondary | ICD-10-CM | POA: Diagnosis not present

## 2018-01-02 ENCOUNTER — Other Ambulatory Visit: Payer: Self-pay | Admitting: Cardiovascular Disease

## 2018-01-04 DIAGNOSIS — B078 Other viral warts: Secondary | ICD-10-CM | POA: Diagnosis not present

## 2018-01-04 DIAGNOSIS — D492 Neoplasm of unspecified behavior of bone, soft tissue, and skin: Secondary | ICD-10-CM | POA: Diagnosis not present

## 2018-01-04 DIAGNOSIS — L859 Epidermal thickening, unspecified: Secondary | ICD-10-CM | POA: Diagnosis not present

## 2018-01-05 DIAGNOSIS — C672 Malignant neoplasm of lateral wall of bladder: Secondary | ICD-10-CM | POA: Diagnosis not present

## 2018-01-05 DIAGNOSIS — N401 Enlarged prostate with lower urinary tract symptoms: Secondary | ICD-10-CM | POA: Diagnosis not present

## 2018-01-10 DIAGNOSIS — H2513 Age-related nuclear cataract, bilateral: Secondary | ICD-10-CM | POA: Diagnosis not present

## 2018-01-10 DIAGNOSIS — I10 Essential (primary) hypertension: Secondary | ICD-10-CM | POA: Diagnosis not present

## 2018-01-10 DIAGNOSIS — H25043 Posterior subcapsular polar age-related cataract, bilateral: Secondary | ICD-10-CM | POA: Diagnosis not present

## 2018-01-10 DIAGNOSIS — H25013 Cortical age-related cataract, bilateral: Secondary | ICD-10-CM | POA: Diagnosis not present

## 2018-02-06 DIAGNOSIS — H2511 Age-related nuclear cataract, right eye: Secondary | ICD-10-CM | POA: Diagnosis not present

## 2018-02-06 DIAGNOSIS — H25811 Combined forms of age-related cataract, right eye: Secondary | ICD-10-CM | POA: Diagnosis not present

## 2018-02-07 ENCOUNTER — Encounter: Payer: Self-pay | Admitting: Cardiovascular Disease

## 2018-02-07 ENCOUNTER — Ambulatory Visit: Payer: PPO | Admitting: Cardiovascular Disease

## 2018-02-07 VITALS — BP 138/74 | HR 53 | Ht 67.0 in | Wt 163.2 lb

## 2018-02-07 DIAGNOSIS — I2510 Atherosclerotic heart disease of native coronary artery without angina pectoris: Secondary | ICD-10-CM | POA: Diagnosis not present

## 2018-02-07 DIAGNOSIS — I739 Peripheral vascular disease, unspecified: Secondary | ICD-10-CM

## 2018-02-07 DIAGNOSIS — I6523 Occlusion and stenosis of bilateral carotid arteries: Secondary | ICD-10-CM | POA: Diagnosis not present

## 2018-02-07 DIAGNOSIS — I255 Ischemic cardiomyopathy: Secondary | ICD-10-CM | POA: Diagnosis not present

## 2018-02-07 DIAGNOSIS — I10 Essential (primary) hypertension: Secondary | ICD-10-CM

## 2018-02-07 DIAGNOSIS — Z720 Tobacco use: Secondary | ICD-10-CM

## 2018-02-07 DIAGNOSIS — H2512 Age-related nuclear cataract, left eye: Secondary | ICD-10-CM | POA: Diagnosis not present

## 2018-02-07 NOTE — Progress Notes (Signed)
Chief Complaint  Patient presents with  . Follow-up    CAD   History of Present Illness: 72 yo male with history of COPD, atrial fibrillation, CAD, ischemic cardiomyopathy, HTN, hyperlipidemia, ongoing tobacco abuse and PAD who is here today for follow up. His PV issues have been followed by Dr. Donnetta Hutching in [facility].  He has had bladder cancer and multiple surgeries on his bladder. Last carotid artery dopplers August 2016 with mild bilateral disease. He is known to have CAD with inferior MI in 1992, PTCA of RCA at that time. He had an anterior MI in 2000 and had a stent placed in the LAD. In 2006, he had an inferior MI with a Taxus stent placed in the RCA. In September 2006, repeat cath with occlusion of RCA in the stent. 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 Echo 2017 with stable mild LV systolic dysfunction, ALPF=79-02% (previous 35-40%). Nuclear stress test September 2017 with no ischemia but LVEF of 23%. He was seen here in our office 03/04/16 by Truitt Merle, NP with c/o dizziness, cough, weakness and was found to be in atrial fibrillation with slow rate. Cardiac monitor Feb 2018 with NSVT. He was seen in EP clinic by Dr. Rayann Heman. Beta blocker changed to Coreg and Xarelto was started. Dizziness resolved when Lasix was stopped. He was seen in our office 09/25/17 by Cecilie Kicks, NP and c/o dyspnea and fatigue. He also noted lack of appetite and 20 lb weight loss. He also reported soreness in the muscles of his chest, not like prior cardiac pain. Echo 10/06/17 with LVEF of 30% with hypokinesis of the inferolateral, inferior and inferoseptal walls. No significant valve disease noted. Nuclear stress test was identical to stress test in 2017 showing large scar in the inferior and apical walls with LVEF 29%. Of note, CT scan in 2017 in [facility] with evidence of emphysema but no masses or suspicious nodules. Cardiac cath July 2019 with a severe stenosis in the proximal LAD  treated with a drug eluting stent. The RCA is chronically occluded and fills from left to right collaterals.   He is here today for follow up. The patient denies any chest pain, dyspnea, palpitations, lower extremity edema, orthopnea, PND, dizziness, near syncope or syncope.   Primary Care Physician: Nicoletta Dress, MD Sherlyn Lees, FNP-BC)  Past Medical History:  Diagnosis Date  . Bladder cancer ([place])    resection x3  . Chronic systolic CHF (congestive heart failure) ([place])   . Cigarette smoker   . Coronary artery disease    status post DMI RX Taxus stent RCA 2006 with susequent Stent LAD and subsequent  stent thrombosis RCA unable to be opened 2006 -neg mv 10/2008, 10/16/17 ISR to pLAD with PTCA/DES, CTO of RCA with collaterals, EF 25%  . Hyperlipidemia   . Hypertension   . Ischemic cardiomyopathy    ejection fraction of 40-45%  . Lumbar spinal stenosis   . Myocardial infarction ([place])    "I've had 4" (10/16/2017)  . NSVT (nonsustained ventricular tachycardia) ([place])   . Paroxysmal atrial fibrillation ([facility])   . PVC's (premature ventricular contractions)   . PVD (peripheral vascular disease) ([place])    status post bilateral aortobifemoral bypass in1992 with recent fem to fembypass April  2011 per DR. Early  . Type II diabetes mellitus ([place])     Past Surgical History:  Procedure Laterality Date  . AORTA - BILATERAL FEMORAL ARTERY BYPASS GRAFT  Bilateral 1992  . APPENDECTOMY    . BACK SURGERY    . CORONARY ANGIOPLASTY WITH STENT PLACEMENT     taxus stent  placed into his right coronary artery; stent placed to the LAD.    [name] CORONARY STENT INTERVENTION N/A 10/16/2017   Procedure: CORONARY STENT INTERVENTION;  Surgeon: Burnell Blanks, MD;  Location: [facility];  Service: Cardiovascular;  Laterality: N/A;  . FEMORAL-FEMORAL BYPASS GRAFT  07/2009  . LUMBAR MICRODISCECTOMY  12/31/08  . RIGHT/LEFT HEART CATH AND CORONARY ANGIOGRAPHY N/A 10/16/2017   Procedure: RIGHT/LEFT HEART  CATH AND CORONARY ANGIOGRAPHY;  Surgeon: Burnell Blanks, MD;  Location: [facility];  Service: Cardiovascular;  Laterality: N/A;  . SHOULDER OPEN ROTATOR CUFF REPAIR Left     Current Outpatient Medications  Medication Sig Dispense Refill  . budesonide-formoterol (SYMBICORT) 160-4.5 MCG/ACT inhaler Inhale 2 puffs into the lungs 2 (two) times daily.    . carvedilol (COREG) 12.5 MG tablet Take 1 tablet (12.5 mg total) by mouth 2 (two) times daily. 180 tablet 2  . cholecalciferol (VITAMIN D) 1000 UNITS tablet Take 1,000 Units by mouth daily.      . clopidogrel (PLAVIX) 75 MG tablet Take 1 tablet (75 mg total) by mouth daily with breakfast. 90 tablet 1  . fluticasone (FLONASE) 50 MCG/ACT nasal spray Place 2 sprays into both nostrils daily as needed for allergies.     [name] loratadine (CLARITIN) 10 MG tablet Take 10 mg by mouth 2 (two) times daily.    [name] losartan (COZAAR) 100 MG tablet TAKE 1 TABLET BY MOUTH ONCE DAILY 90 tablet 3  . Melatonin 10 MG CAPS Take 10 mg by mouth at bedtime.     . nitroGLYCERIN (NITROSTAT) 0.4 MG SL tablet Place 1 tablet (0.4 mg total) under the tongue every 5 (five) minutes as needed. 25 tablet 2  . Nutritional Supplements (JUICE PLUS FIBRE PO) Take 2 tablets by mouth 2 (two) times daily.    . tamsulosin (FLOMAX) 0.4 MG CAPS capsule Take 0.4 mg by mouth 2 (two) times daily. Take as directed    . trimethoprim-polymyxin b (POLYTRIM) ophthalmic solution Place 2 drops into the right eye 3 (three) times daily.    Alveda Reasons 20 MG TABS tablet TAKE 1 TABLET BY MOUTH EVERY DAY WITH SUPPER 30 tablet 10  . BESIVANCE 0.6 % SUSP Place 1 drop into the right eye 3 (three) times daily.  1  . DUREZOL 0.05 % EMUL Place 1 drop into the right eye 3 (three) times daily.  1  . gabapentin (NEURONTIN) 400 MG capsule Take 400 mg by mouth 2 (two) times daily.    . rosuvastatin (CRESTOR) 20 MG tablet Take 1 tablet (20 mg total) by mouth daily. 90 tablet 3  . VICTOZA 18 MG/3ML SOLN  injection Inject 1.2 mg into the skin daily.      No current facility-administered medications for this visit.     Allergies  Allergen Reactions  . Lisinopril Swelling and Rash    Rash - face and tounge swell    Social History   Socioeconomic History  . Marital status: Married    Spouse name: Not on file  . Number of children: 1  . Years of education: Not on file  . Highest education level: Not on file  Occupational History  . 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 Financial resource strain: Not on file  .  Food insecurity:    Worry: Not on file    Inability: Not on file  . Transportation needs:    Medical: Not on file    Non-medical: Not on file  Tobacco Use  . Smoking status: Current Every Day Smoker    Packs/day: 0.50    Years: 56.00    Pack years: 28.00    Types: Cigars  . Smokeless tobacco: Former Systems developer    Types: Chew  Substance and Sexual Activity  . Alcohol use: Yes    Comment: 10/16/2017 "1 beer q 2-3 months"  . Drug use: Never  . Sexual activity: Not Currently  Lifestyle  . Physical activity:    Days per week: Not on file    Minutes per session: Not on file  . Stress: Not on file  Relationships  . Social connections:    Talks on phone: Not on file    Gets together: Not on file    Attends religious service: Not on file    Active member of club or organization: Not on file    Attends meetings of clubs or organizations: Not on file    Relationship status: Not on file  . Intimate partner violence:    Fear of current or ex partner: Not on file    Emotionally abused: Not on file    Physically abused: Not on file    Forced sexual activity: Not on file  Other Topics Concern  . Not on file  Social History Narrative   Lives with girlfriend in [place].      Family History  Problem Relation Age of Onset  . Coronary artery disease Unknown   . Diabetes Unknown   . Cardiomyopathy Mother   . Heart disease  Mother   . Hyperlipidemia Mother   . Coronary artery disease Father   . Stroke Father   . Diabetes Father   . Heart disease Father        before age 35  . Hyperlipidemia Father   . Heart attack Father   . Diabetes Sister   . Heart disease Sister        before age 68  . Hyperlipidemia Sister   . Heart attack Sister     Review of Systems:  As stated in the HPI and otherwise negative.   BP 138/74   Pulse (!) 53   Ht 5' 7"  (1.702 m)   Wt 163 lb 3.2 oz (74 kg)   SpO2 96%   BMI 25.56 kg/m   Physical Examination:  General: Well developed, well nourished, NAD  HEENT: OP clear, mucus membranes moist  SKIN: warm, dry. No rashes. Neuro: No focal deficits  Musculoskeletal: Muscle strength 5/5 all ext  Psychiatric: Mood and affect normal  Neck: No JVD, no carotid bruits, no thyromegaly, no lymphadenopathy.  Lungs:Clear bilaterally, no wheezes, rhonci, crackles Cardiovascular: Regular rate and rhythm. No murmurs, gallops or rubs. Abdomen:Soft. Bowel sounds present. Non-tender.  Extremities: No lower extremity edema. Pulses are 2 + in the bilateral DP/PT.  Echo June 2019: - Left ventricle: The cavity size was mildly dilated. Systolic   function was severely reduced. The estimated ejection fraction   was in the range of 25% to 30%. Hypokinesis of the inferolateral,   inferior, and inferoseptal myocardium. Doppler parameters are   consistent with abnormal left ventricular relaxation (grade 1   diastolic dysfunction). Doppler parameters are consistent with   high ventricular filling pressure. - Aortic valve: Transvalvular velocity was within the normal range.  There was no stenosis. There was no regurgitation. - Mitral valve: Transvalvular velocity was within the normal range.   There was no evidence for stenosis. There was trivial   regurgitation. - Left atrium: The atrium was moderately dilated. - Right ventricle: The cavity size was mildly dilated. Wall   thickness was  normal. Systolic function was normal. - Atrial septum: No defect or patent foramen ovale was identified. - Tricuspid valve: There was no regurgitation.  Nuclear StressTest June 2019:  Nuclear stress EF: 29%.  Defect 1: There is a large defect of severe severity present in the basal inferoseptal, basal inferior, mid inferoseptal, mid inferior, apical anterior, apical septal, apical inferior, apical lateral and apex location.  This is a high risk study.  Findings consistent with prior myocardial infarction.  The left ventricular ejection fraction is severely decreased (<30%).   High risk study due to a very large old scar and severely reduced left ventricular systolic function. No reversible ischemia is seen.  EKG:  EKG is not  ordered today. The ekg ordered today demonstrates  Recent Labs: 10/17/2017: BUN 13; Creatinine, Ser 1.05; Hemoglobin 13.5; Platelets 168; Potassium 4.0; Sodium 140   Lipid Panel Followed in primary care   Wt Readings from Last 3 Encounters:  02/07/18 163 lb 3.2 oz (74 kg)  11/07/17 160 lb (72.6 kg)  10/17/17 154 lb 12.2 oz (70.2 kg)     Other studies Reviewed: Additional studies/ records that were reviewed today include: . Review of the above records demonstrates:    Assessment and Plan:   1. CAD without angina: No chest pain since his PCI in July 2019. Continue Plavix, beta blocker and statin. No ASA since he is on Xarelto.    2. CARDIOMYOPATHY, ISCHEMIC: LVEF 30% by echo and nuclear stress test. He has since had a stent placed in the LAD. Will repeat echo now. Will continue ARB and beta blocker. Will not consider Entresto as he is NYHA class 1.    3. PAD: This had been followed in [facility]. He needs to see DR. Early.   4. Atrial fibrillation, paroxysmal: Sinus today. Continue beta blocker and Xarelto. Check BMET today.   5. TOBACCO ABUSE: Smoking cessation advised.    6. HTN: BP is controlled.   7. Carotid artery disease: Mild bilateral disease by  dopplers August 2016.   8. Hyperlipidemia: Continue statin. Check lipids and LFTs    Current medicines are reviewed at length with the patient today.  The patient does not have concerns regarding medicines.  The following changes have been made:  no change  Labs/ tests ordered today include:   Orders Placed This Encounter  Procedures  . Comp Met (CMET)  . Lipid Profile  . ECHOCARDIOGRAM COMPLETE    Disposition:   FU with me in 6 months  Signed, Lauree Chandler, MD 02/07/2018 9:37 AM    [facility] [address], [place], [place]  [address] Phone: [phone]; Fax: [phone]

## 2018-02-07 NOTE — Patient Instructions (Signed)
Medication Instructions:  Your physician recommends that you continue on your current medications as directed. Please refer to the Current Medication list given to you today.  If you need a refill on your cardiac medications before your next appointment, please call your pharmacy.   Lab work: Your physician recommends that you return for lab work on day of echocardiogram.  Lipid and CMET.  This will be fasting  If you have labs (blood work) drawn today and your tests are completely normal, you will receive your results only by: [name] MyChart Message (if you have MyChart) OR . A paper copy in the mail If you have any lab test that is abnormal or we need to change your treatment, we will call you to review the results.  Testing/Procedures: Your physician has requested that you have an echocardiogram. Echocardiography is a painless test that uses sound waves to create images of your heart. It provides your doctor with information about the size and shape of your heart and how well your heart's chambers and valves are working. This procedure takes approximately one hour. There are no restrictions for this procedure.    Follow-Up: At [facility], you and your health needs are our priority.  As part of our continuing mission to provide you with exceptional heart care, we have created designated Provider Care Teams.  These Care Teams include your primary Cardiologist (physician) and Advanced Practice Providers (APPs -  Physician Assistants and Nurse Practitioners) who all work together to provide you with the care you need, when you need it. You will need a follow up appointment in 6 months.  Please call our office 2 months in advance to schedule this appointment.  You may see Lauree Chandler, MD or one of the following Advanced Practice Providers on your designated Care Team:   [place], PA-C Melina Copa, PA-C . Ermalinda Barrios, PA-C  Any Other Special Instructions Will Be Listed Below  (If Applicable).

## 2018-02-09 ENCOUNTER — Ambulatory Visit (HOSPITAL_COMMUNITY): Payer: PPO | Attending: Internal Medicine

## 2018-02-09 ENCOUNTER — Other Ambulatory Visit: Payer: PPO | Admitting: *Deleted

## 2018-02-09 ENCOUNTER — Other Ambulatory Visit: Payer: Self-pay

## 2018-02-09 DIAGNOSIS — I2510 Atherosclerotic heart disease of native coronary artery without angina pectoris: Secondary | ICD-10-CM | POA: Insufficient documentation

## 2018-02-09 DIAGNOSIS — I255 Ischemic cardiomyopathy: Secondary | ICD-10-CM | POA: Diagnosis not present

## 2018-02-09 LAB — LIPID PANEL
CHOL/HDL RATIO: 3.3 ratio (ref 0.0–5.0)
Cholesterol, Total: 129 mg/dL (ref 100–199)
HDL: 39 mg/dL — AB (ref >39)
LDL Calculated: 75 mg/dL (ref 0–99)
Triglycerides: 77 mg/dL (ref 0–149)
VLDL Cholesterol Cal: 15 mg/dL (ref 5–40)

## 2018-02-09 LAB — COMPREHENSIVE METABOLIC PANEL
ALT: 10 IU/L (ref 0–44)
AST: 14 IU/L (ref 0–40)
Albumin/Globulin Ratio: 1.8 (ref 1.2–2.2)
Albumin: 4.4 g/dL (ref 3.5–4.8)
Alkaline Phosphatase: 69 IU/L (ref 39–117)
BILIRUBIN TOTAL: 0.6 mg/dL (ref 0.0–1.2)
BUN/Creatinine Ratio: 16 (ref 10–24)
BUN: 17 mg/dL (ref 8–27)
CHLORIDE: 107 mmol/L — AB (ref 96–106)
CO2: 18 mmol/L — ABNORMAL LOW (ref 20–29)
Calcium: 9.7 mg/dL (ref 8.6–10.2)
Creatinine, Ser: 1.04 mg/dL (ref 0.76–1.27)
GFR, EST AFRICAN AMERICAN: 82 mL/min/{1.73_m2} (ref >59)
GFR, EST NON AFRICAN AMERICAN: 71 mL/min/{1.73_m2} (ref >59)
GLOBULIN, TOTAL: 2.5 g/dL (ref 1.5–4.5)
Glucose: 154 mg/dL — ABNORMAL HIGH (ref 65–99)
POTASSIUM: 4.5 mmol/L (ref 3.5–5.2)
SODIUM: 141 mmol/L (ref 134–144)
TOTAL PROTEIN: 6.9 g/dL (ref 6.0–8.5)

## 2018-02-13 DIAGNOSIS — H25812 Combined forms of age-related cataract, left eye: Secondary | ICD-10-CM | POA: Diagnosis not present

## 2018-02-13 DIAGNOSIS — H2512 Age-related nuclear cataract, left eye: Secondary | ICD-10-CM | POA: Diagnosis not present

## 2018-02-15 ENCOUNTER — Telehealth: Payer: Self-pay | Admitting: *Deleted

## 2018-02-15 NOTE — Telephone Encounter (Signed)
Pt's wife is listed on DPR.   I spoke with pt's wife. She is returning message left on voicemail to call office. Since this message was left I have spoken with pt as outlined below.  Pt's wife reports she has not spoken with pt regarding echo and EP referral today.  I explained recommendation for EP referral to pt's wife based on echo results. I told her I had spoken with pt today and he wanted to wait on referral.  I told pt's wife they could call back at any time and we would arrange appointment with EP.

## 2018-02-15 NOTE — Telephone Encounter (Signed)
Thanks!    Chris.

## 2018-02-15 NOTE — Telephone Encounter (Signed)
Received message from [place] (EP scheduler) that she had spoken with pt's wife who told her pt did not want to proceed with EP referral at this time. I spoke with pt this morning. He states he wants to wait on referral to EP.  I again explained to pt results of echo and risk of fatal arrhythmia.  I told him he could call back any time and we could get him scheduled to see one of our EP physicians.

## 2018-02-15 NOTE — Telephone Encounter (Signed)
New message   Patient's wife would like a call back to discuss the referral per the previous message.

## 2018-02-28 ENCOUNTER — Other Ambulatory Visit: Payer: Self-pay | Admitting: Cardiovascular Disease

## 2018-02-28 MED ORDER — CARVEDILOL 12.5 MG PO TABS: 12.5000 mg | ORAL_TABLET | Freq: Two times a day (BID) | ORAL | 3 refills | Status: DC

## 2018-02-28 NOTE — Telephone Encounter (Signed)
Pt's medication was sent to pt's pharmacy as requested. Confirmation received.  °

## 2018-03-02 DIAGNOSIS — E1122 Type 2 diabetes mellitus with diabetic chronic kidney disease: Secondary | ICD-10-CM | POA: Diagnosis not present

## 2018-03-02 DIAGNOSIS — Z23 Encounter for immunization: Secondary | ICD-10-CM | POA: Diagnosis not present

## 2018-05-08 ENCOUNTER — Other Ambulatory Visit: Payer: Self-pay | Admitting: Cardiovascular Disease

## 2018-05-08 DIAGNOSIS — H02839 Dermatochalasis of unspecified eye, unspecified eyelid: Secondary | ICD-10-CM | POA: Diagnosis not present

## 2018-05-16 ENCOUNTER — Other Ambulatory Visit: Payer: Self-pay | Admitting: Cardiovascular Disease

## 2018-06-05 DIAGNOSIS — E785 Hyperlipidemia, unspecified: Secondary | ICD-10-CM | POA: Diagnosis not present

## 2018-06-05 DIAGNOSIS — Z0000 Encounter for general adult medical examination without abnormal findings: Secondary | ICD-10-CM | POA: Diagnosis not present

## 2018-06-05 DIAGNOSIS — I1310 Hypertensive heart and chronic kidney disease without heart failure, with stage 1 through stage 4 chronic kidney disease, or unspecified chronic kidney disease: Secondary | ICD-10-CM | POA: Diagnosis not present

## 2018-06-05 DIAGNOSIS — E559 Vitamin D deficiency, unspecified: Secondary | ICD-10-CM | POA: Diagnosis not present

## 2018-06-05 DIAGNOSIS — Z9181 History of falling: Secondary | ICD-10-CM | POA: Diagnosis not present

## 2018-06-05 DIAGNOSIS — J449 Chronic obstructive pulmonary disease, unspecified: Secondary | ICD-10-CM | POA: Diagnosis not present

## 2018-06-05 DIAGNOSIS — E1122 Type 2 diabetes mellitus with diabetic chronic kidney disease: Secondary | ICD-10-CM | POA: Diagnosis not present

## 2018-06-14 ENCOUNTER — Telehealth: Payer: Self-pay | Admitting: *Deleted

## 2018-06-14 NOTE — Telephone Encounter (Signed)
An appeal completed and faxed to [facility] for tier exception for XARELTO.

## 2018-06-28 NOTE — Telephone Encounter (Signed)
Received a denial letter regarding an appeal for lower tier cost for patients XARELTO. " Medication will stay at tier 3 cost due to brand name drugs in the higher cost tier cannot have the copayment lowered when the lower tiers do not contain branded drugs for your condition."  I informed pt by phone, he is interested in medication assistance for xarelto, I will mail him the forms.   Provider form to Dr Alyse Low nurse for signature tomorrow.

## 2018-06-29 NOTE — Telephone Encounter (Signed)
**Note De-Identified Phillippe Orlick Obfuscation** Dr Angelena Form has signed the provider part of the pts [name] and [place] pt assistance application for Xarelto. I have placed it in the yellow folder in the PA dept awaiting the pts part of the application.

## 2018-07-05 DIAGNOSIS — E119 Type 2 diabetes mellitus without complications: Secondary | ICD-10-CM | POA: Diagnosis not present

## 2018-07-09 DIAGNOSIS — C679 Malignant neoplasm of bladder, unspecified: Secondary | ICD-10-CM | POA: Diagnosis not present

## 2018-08-10 ENCOUNTER — Telehealth: Payer: Self-pay | Admitting: Cardiovascular Disease

## 2018-08-10 NOTE — Telephone Encounter (Signed)
°  4/24 :  Left VM for patient to return call regarding appointment on 08/17/2018.

## 2018-08-10 NOTE — Telephone Encounter (Signed)
°  Per [place] # listed is the # that should be called. I changed the home # to the bolded #.  Left VM on home #.

## 2018-08-10 NOTE — Telephone Encounter (Signed)
°  4/24 :   Called home # listed as well and phone # listed on DPR.

## 2018-08-10 NOTE — Telephone Encounter (Signed)
New message   Patient's wife is returning call about appointment. Please call the home # on file going forward.

## 2018-08-15 NOTE — Telephone Encounter (Signed)
°  4/29 :  Left another VM for patient to return my call. Call is in regards to virtual visit on 08/17/2018. Also will need consent from patient. Please transfer call to my cell phone when patient returns call. Cell # [phone].

## 2018-08-16 NOTE — Telephone Encounter (Signed)
NEW MESSAGE - 4/30 :  Spoke to patient.      VIDEO/Doximity visit on 08/17/2018     Phone Call to obtain consent   -  08/16/2018         Virtual Visit Pre-Appointment Phone Call  "(Name), I am calling you today to discuss your upcoming appointment. We are currently trying to limit exposure to the virus that causes COVID-19 by seeing patients at home rather than in the office."  1. "What is the BEST phone number to call the day of the visit?" - include this in appointment notes  2. Do you have or have access to (through a family member/friend) a smartphone with video capability that we can use for your visit?" a. If yes - list this number in appt notes as cell (if different from BEST phone #) and list the appointment type as a VIDEO visit in appointment notes b. If no - list the appointment type as a PHONE visit in appointment notes  3. Confirm consent - "In the setting of the current Covid19 crisis, you are scheduled for a (phone or video) visit with your provider on (date) at (time).  Just as we do with many in-office visits, in order for you to participate in this visit, we must obtain consent.  If you'd like, I can send this to your mychart (if signed up) or email for you to review.  Otherwise, I can obtain your verbal consent now.  All virtual visits are billed to your insurance company just like a normal visit would be.  By agreeing to a virtual visit, we'd like you to understand that the technology does not allow for your provider to perform an examination, and thus may limit your provider's ability to fully assess your condition. If your provider identifies any concerns that need to be evaluated in person, we will make arrangements to do so.  Finally, though the technology is pretty good, we cannot assure that it will always work on either your or our end, and in the setting of a video visit, we may have to convert it to a phone-only visit.  In either situation, we cannot  ensure that we have a secure connection.  Are you willing to proceed?" STAFF: Did the patient verbally acknowledge consent to telehealth visit? Document YES/NO here: YES   4. Advise patient to be prepared - "Two hours prior to your appointment, go ahead and check your blood pressure, pulse, oxygen saturation, and your weight (if you have the equipment to check those) and write them all down. When your visit starts, your provider will ask you for this information. If you have an Apple Watch or Kardia device, please plan to have heart rate information ready on the day of your appointment. Please have a pen and paper handy nearby the day of the visit as well."  5. Give patient instructions for MyChart download to smartphone OR Doximity/Doxy.me as below if video visit (depending on what platform provider is using)  6. Inform patient they will receive a phone call 15 minutes prior to their appointment time (may be from unknown caller ID) so they should be prepared to answer    TELEPHONE CALL NOTE  [name] has been deemed a candidate for a follow-up tele-health visit to limit community exposure during the Covid-19 pandemic. I spoke with the patient via phone to ensure availability of phone/video source, confirm preferred email & phone number, and discuss instructions and expectations.  I reminded [name] to be prepared with any vital sign and/or heart rhythm information that could potentially be obtained via home monitoring, at the time of his visit. I reminded [name] to expect a phone call prior to his visit.  [name] 08/16/2018 2:46 PM   INSTRUCTIONS FOR DOWNLOADING THE MYCHART APP TO SMARTPHONE  - The patient must first make sure to have activated MyChart and know their login information - If Apple, go to [employer] and type in MyChart in the search bar and download the app. If Android, ask patient to go to [employer] and type in [place] in the search bar and download  the app. The app is free but as with any other app downloads, their phone may require them to verify saved payment information or Apple/Android password.  - The patient will need to then log into the app with their MyChart username and password, and select [facility] as their healthcare provider to link the account. When it is time for your visit, go to the MyChart app, find appointments, and click Begin Video Visit. Be sure to Select Allow for your device to access the Microphone and Camera for your visit. You will then be connected, and your provider will be with you shortly.  **If they have any issues connecting, or need assistance please contact MyChart service desk [phone])**  **If using a computer, in order to ensure the best quality for their visit they will need to use either of the following Internet Browsers: [employer], or Google Chrome**  IF USING DOXIMITY or DOXY.ME - The patient will receive a link just prior to their visit by text.     FULL LENGTH CONSENT FOR TELE-HEALTH VISIT   I hereby voluntarily request, consent and authorize [facility] and its employed or contracted physicians, physician assistants, nurse practitioners or other licensed health care professionals (the Practitioner), to provide me with telemedicine health care services (the Services") as deemed necessary by the treating Practitioner. I acknowledge and consent to receive the Services by the Practitioner via telemedicine. I understand that the telemedicine visit will involve communicating with the Practitioner through live audiovisual communication technology and the disclosure of certain medical information by electronic transmission. I acknowledge that I have been given the opportunity to request an in-person assessment or other available alternative prior to the telemedicine visit and am voluntarily participating in the telemedicine visit.  I understand that I have the right to withhold  or withdraw my consent to the use of telemedicine in the course of my care at any time, without affecting my right to future care or treatment, and that the Practitioner or I may terminate the telemedicine visit at any time. I understand that I have the right to inspect all information obtained and/or recorded in the course of the telemedicine visit and may receive copies of available information for a reasonable fee.  I understand that some of the potential risks of receiving the Services via telemedicine include:   Delay or interruption in medical evaluation due to technological equipment failure or disruption;  Information transmitted may not be sufficient (e.g. poor resolution of images) to allow for appropriate medical decision making by the Practitioner; and/or   In rare instances, security protocols could fail, causing a breach of personal health information.  Furthermore, I acknowledge that it is my responsibility to provide information about my medical history, conditions and care that is complete and accurate to the best of my ability. I acknowledge  that Practitioner's advice, recommendations, and/or decision may be based on factors not within their control, such as incomplete or inaccurate data provided by me or distortions of diagnostic images or specimens that may result from electronic transmissions. I understand that the practice of medicine is not an exact science and that Practitioner makes no warranties or guarantees regarding treatment outcomes. I acknowledge that I will receive a copy of this consent concurrently upon execution via email to the email address I last provided but may also request a printed copy by calling the office of [facility].    I understand that my insurance will be billed for this visit.   I have read or had this consent read to me.  I understand the contents of this consent, which adequately explains the benefits and risks of the Services being provided  via telemedicine.   I have been provided ample opportunity to ask questions regarding this consent and the Services and have had my questions answered to my satisfaction.  I give my informed consent for the services to be provided through the use of telemedicine in my medical care  By participating in this telemedicine visit I agree to the above.

## 2018-08-17 ENCOUNTER — Telehealth (INDEPENDENT_AMBULATORY_CARE_PROVIDER_SITE_OTHER): Payer: Medicare Other | Admitting: Cardiovascular Disease

## 2018-08-17 ENCOUNTER — Other Ambulatory Visit: Payer: Self-pay

## 2018-08-17 ENCOUNTER — Encounter: Payer: Self-pay | Admitting: Cardiovascular Disease

## 2018-08-17 VITALS — Ht 67.0 in | Wt 150.0 lb

## 2018-08-17 DIAGNOSIS — Z720 Tobacco use: Secondary | ICD-10-CM | POA: Diagnosis not present

## 2018-08-17 DIAGNOSIS — I10 Essential (primary) hypertension: Secondary | ICD-10-CM | POA: Diagnosis not present

## 2018-08-17 DIAGNOSIS — I2510 Atherosclerotic heart disease of native coronary artery without angina pectoris: Secondary | ICD-10-CM

## 2018-08-17 DIAGNOSIS — I255 Ischemic cardiomyopathy: Secondary | ICD-10-CM | POA: Diagnosis not present

## 2018-08-17 DIAGNOSIS — I739 Peripheral vascular disease, unspecified: Secondary | ICD-10-CM

## 2018-08-17 DIAGNOSIS — I6523 Occlusion and stenosis of bilateral carotid arteries: Secondary | ICD-10-CM

## 2018-08-17 DIAGNOSIS — I480 Paroxysmal atrial fibrillation: Secondary | ICD-10-CM

## 2018-08-17 NOTE — Telephone Encounter (Signed)
Pt did not receive the [employer] Emer[employer] and [place] pt asst application we mailed to him. I have contacted and he is aware that we are mailing him another one today.

## 2018-08-17 NOTE — Patient Instructions (Signed)
Medication Instructions:  Your physician recommends that you continue on your current medications as directed. Please refer to the Current Medication list given to you today.  If you need a refill on your cardiac medications before your next appointment, please call your pharmacy.   Lab work: None Ordered  If you have labs (blood work) drawn today and your tests are completely normal, you will receive your results only by: [name] MyChart Message (if you have MyChart) OR . A paper copy in the mail If you have any lab test that is abnormal or we need to change your treatment, we will call you to review the results.  Testing/Procedures: None ordered  Follow-Up: At [facility], you and your health needs are our priority.  As part of our continuing mission to provide you with exceptional heart care, we have created designated Provider Care Teams.  These Care Teams include your primary Cardiologist (physician) and Advanced Practice Providers (APPs -  Physician Assistants and Nurse Practitioners) who all work together to provide you with the care you need, when you need it. . You will need a follow up appointment in 6 months.  Please call our office 2 months in advance to schedule this appointment.  You may see Darlina Guys, MD or one of the following Advanced Practice Providers on your designated Care Team:   . Lyda Jester, PA-C . Dayna Dunn, PA-C . Ermalinda Barrios, PA-C  Any Other Special Instructions Will Be Listed Below (If Applicable).

## 2018-08-17 NOTE — Progress Notes (Signed)
Virtual Visit via Video Note   This visit type was conducted due to national recommendations for restrictions regarding the COVID-19 Pandemic (e.g. social distancing) in an effort to limit this patient's exposure and mitigate transmission in our community.  Due to his co-morbid illnesses, this patient is at least at moderate risk for complications without adequate follow up.  This format is felt to be most appropriate for this patient at this time.  All issues noted in this document were discussed and addressed.  A limited physical exam was performed with this format.  Please refer to the patient's chart for his consent to telehealth for [facility].   Date:  08/17/2018   ID:  [name], DOB 1945/10/05, MRN [id number]  Patient Location: Home Provider Location: Office  PCP:  Nicoletta Dress, MD  Cardiologist:  Lauree Chandler, MD  Electrophysiologist:  None   Evaluation Performed:  Follow-Up Visit  Chief Complaint:  Follow up CAD  History of Present Illness:    [name] is a 73 y.o. male with history of COPD, atrial fibrillation, CAD, ischemic cardiomyopathy, HTN, hyperlipidemia, ongoing tobacco abuse and PAD who is being seen today by virtual e-visit due to the Covid19 pandemic. His PV issues have been followed by Dr. Donnetta Hutching in [facility].  He has had bladder cancer and multiple surgeries on his bladder. Last carotid artery dopplers August 2016 with mild bilateral disease. He is known to have CAD with inferior MI in 1992, PTCA of RCA at that time. He had an anterior MI in 2000 and had a stent placed in the LAD. In 2006, he had an inferior MI with a Taxus stent placed in the RCA. In September 2006, repeat cath with occlusion of RCA in the stent. 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 Echo 2017 with stable mild LV systolic dysfunction, ERXV=40-08% (previous 35-40%). Nuclear stress test September 2017 with no ischemia but LVEF of 23%. He was seen here  in our office 03/04/16 by Truitt Merle, NP with c/o dizziness, cough, weakness and was found to be in atrial fibrillation with slow rate. Cardiac monitor Feb 2018 with NSVT. He was seen in EP clinic by Dr. Rayann Heman. Beta blocker changed to Coreg and Xarelto was started. Dizziness resolved when Lasix was stopped. He was seen in our office 09/25/17 by Cecilie Kicks, NP and c/o dyspnea and fatigue. He also noted lack of appetite and 20 lb weight loss. He also reported soreness in the muscles of his chest, not like prior cardiac pain. Echo 10/06/17 with LVEF of 30% with hypokinesis of the inferolateral, inferior and inferoseptal walls. No significant valve disease noted. Nuclear stress test was identical to stress test in 2017 showing large scar in the inferior and apical walls with LVEF 29%. Of note, CT scan in 2017 in [facility] with evidence of emphysema but no masses or suspicious nodules. Cardiac cath July 2019 with a severe stenosis in the proximal LAD treated with a drug eluting stent. The RCA is chronically occluded and fills from left to right collaterals.   He tells me today that he has been doing well. No chest pain or dyspnea. No LE edema. His only concern today is how much his Xarelto costs. He has a new Buyer, retail and tells me that he is paying 400 dollars per month for Xarelto.   The patient does not have symptoms concerning for COVID-19 infection (fever, chills, cough, or  new shortness of breath).    Past Medical History:  Diagnosis Date  . Bladder cancer ([place])    resection x3  . Chronic systolic CHF (congestive heart failure) ([place])   . Cigarette smoker   . Coronary artery disease    status post DMI RX Taxus stent RCA 2006 with susequent Stent LAD and subsequent  stent thrombosis RCA unable to be opened 2006 -neg mv 10/2008, 10/16/17 ISR to pLAD with PTCA/DES, CTO of RCA with collaterals, EF 25%  . Hyperlipidemia   . Hypertension   . Ischemic cardiomyopathy    ejection fraction of  40-45%  . Lumbar spinal stenosis   . Myocardial infarction ([place])    "I've had 4" (10/16/2017)  . NSVT (nonsustained ventricular tachycardia) ([place])   . Paroxysmal atrial fibrillation ([facility])   . PVC's (premature ventricular contractions)   . PVD (peripheral vascular disease) ([place])    status post bilateral aortobifemoral bypass in1992 with recent fem to fembypass April  2011 per DR. Early  . Type II diabetes mellitus ([place])    Past Surgical History:  Procedure Laterality Date  . AORTA - BILATERAL FEMORAL ARTERY BYPASS GRAFT Bilateral 1992  . APPENDECTOMY    . BACK SURGERY    . CORONARY ANGIOPLASTY WITH STENT PLACEMENT     taxus stent  placed into his right coronary artery; stent placed to the LAD.    [name] CORONARY STENT INTERVENTION N/A 10/16/2017   Procedure: CORONARY STENT INTERVENTION;  Surgeon: Burnell Blanks, MD;  Location: [facility];  Service: Cardiovascular;  Laterality: N/A;  . FEMORAL-FEMORAL BYPASS GRAFT  07/2009  . LUMBAR MICRODISCECTOMY  12/31/08  . RIGHT/LEFT HEART CATH AND CORONARY ANGIOGRAPHY N/A 10/16/2017   Procedure: RIGHT/LEFT HEART CATH AND CORONARY ANGIOGRAPHY;  Surgeon: Burnell Blanks, MD;  Location: [facility];  Service: Cardiovascular;  Laterality: N/A;  . SHOULDER OPEN ROTATOR CUFF REPAIR Left      Current Meds  Medication Sig  . BESIVANCE 0.6 % SUSP Place 1 drop into the right eye 3 (three) times daily.  . budesonide-formoterol (SYMBICORT) 160-4.5 MCG/ACT inhaler Inhale 2 puffs into the lungs 2 (two) times daily as needed.   . carvedilol (COREG) 12.5 MG tablet Take 1 tablet (12.5 mg total) by mouth 2 (two) times daily.  . clopidogrel (PLAVIX) 75 MG tablet TAKE 1 TABLET (75 MG TOTAL) BY MOUTH DAILY WITH BREAKFAST.  . DUREZOL 0.05 % EMUL Place 1 drop into the right eye 3 (three) times daily.  . fluticasone (FLONASE) 50 MCG/ACT nasal spray Place 2 sprays into both nostrils daily as needed for allergies.   [name] gabapentin (NEURONTIN) 400 MG  capsule Take 400 mg by mouth 2 (two) times daily.  [name] loratadine (CLARITIN) 10 MG tablet Take 10 mg by mouth 2 (two) times daily.  [name] losartan (COZAAR) 100 MG tablet TAKE 1 TABLET BY MOUTH EVERY DAY  . Melatonin 10 MG CAPS Take 10 mg by mouth at bedtime.   . metFORMIN (GLUCOPHAGE) 500 MG tablet Take 500 mg by mouth 2 (two) times daily with a meal.  . nitroGLYCERIN (NITROSTAT) 0.4 MG SL tablet Place 1 tablet (0.4 mg total) under the tongue every 5 (five) minutes as needed.  . Nutritional Supplements (JUICE PLUS FIBRE PO) Take 2 tablets by mouth 2 (two) times daily.  . rosuvastatin (CRESTOR) 20 MG tablet Take 1 tablet (20 mg total) by mouth daily.  . tamsulosin (FLOMAX) 0.4 MG CAPS capsule Take 0.4 mg by mouth 2 (two) times  daily. Take as directed  . trimethoprim-polymyxin b (POLYTRIM) ophthalmic solution Place 2 drops into the right eye 3 (three) times daily.  Alveda Reasons 20 MG TABS tablet TAKE 1 TABLET BY MOUTH EVERY DAY WITH SUPPER     Allergies:   Lisinopril   Social History   Tobacco Use  . Smoking status: Current Every Day Smoker    Packs/day: 0.50    Years: 56.00    Pack years: 28.00    Types: Cigars  . Smokeless tobacco: Former Systems developer    Types: Chew  Substance Use Topics  . Alcohol use: Yes    Comment: 10/16/2017 "1 beer q 2-3 months"  . Drug use: Never     Family Hx: The patient's family history includes Cardiomyopathy in his mother; Coronary artery disease in his father and unknown relative; Diabetes in his father, sister, and unknown relative; Heart attack in his father and sister; Heart disease in his father, mother, and sister; Hyperlipidemia in his father, mother, and sister; Stroke in his father.  ROS:   Please see the history of present illness.    All other systems reviewed and are negative.   Prior CV studies:   The following studies were reviewed today:  Echo 02/09/18: - Left ventricle: The cavity size was mildly dilated. Wall   thickness was normal. Systolic  function was severely reduced. The   estimated ejection fraction was in the range of 25% to 30%.   Diffuse hypokinesis with regional variation (worse inferiorly).   Doppler parameters are consistent with diastolic dysfunction and   elevated LV filling pressure. - Aortic valve: Calcified leaflets. Trileaflet. No stenosis of   regurgitation. - Mitral valve: Mildly thickened leaflets . There was mild   regurgitation. - Left atrium: Moderately dilated. - Right atrium: The atrium was mildly dilated. - Inferior vena cava: The vessel was dilated. The respirophasic   diameter changes were blunted (< 50%), consistent with elevated   central venous pressure.  Impressions:  - Compared to a prior study in 09/2017, there has been no   significant changes.  Labs/Other Tests and Data Reviewed:    EKG:  No ECG reviewed.  Recent Labs: 10/17/2017: Hemoglobin 13.5; Platelets 168 02/09/2018: ALT 10; BUN 17; Creatinine, Ser 1.04; Potassium 4.5; Sodium 141   Recent Lipid Panel Lab Results  Component Value Date/Time   CHOL 129 02/09/2018 07:21 AM   TRIG 77 02/09/2018 07:21 AM   HDL 39 (L) 02/09/2018 07:21 AM   CHOLHDL 3.3 02/09/2018 07:21 AM   CHOLHDL 6.1 CALC 11/07/2006 09:58 AM   LDLCALC 75 02/09/2018 07:21 AM    Wt Readings from Last 3 Encounters:  08/17/18 150 lb (68 kg)  02/07/18 163 lb 3.2 oz (74 kg)  11/07/17 160 lb (72.6 kg)     Objective:    Vital Signs:  Ht 5\' 7"  (1.702 m)   Wt 150 lb (68 kg)   BMI 23.49 kg/m    No exam today due pt's webcam not working. Switched to telephone visit.   ASSESSMENT & PLAN:    1. CAD without angina: No chest pain. Will continue Plavix, statin and beta blocker. No ASA since he is on Xarelto.    2. CARDIOMYOPATHY, ISCHEMIC: LVEF=25-30% greater than 90 days following most recent PCI. We referred him to EP to discuss an ICD but he did not wish to consider this. Continue beta blocker and ARB. Would consider Entresto but he is NYHA class 1.   3.  PAD: This had been  followed in [facility]. He needs to see Dr. Donnetta Hutching.   4. Atrial fibrillation, paroxysmal: No palpitations. Will continue beta blocker and Xarelto. He is asking about an alternative to Xarelto due to cost. I have discussed coumadin but he does not wish to make this switch. He says he never received the paperwork in March for the [employer] assistance program. Will ask our staff to look into this.     5. TOBACCO ABUSE: I have once again asked him to stop smoking.   6. HTN: BP cannot be taken today as pt has no cuff at home  7. Carotid artery disease: Mild bilateral disease by dopplers August 2016.   8. Hyperlipidemia: LDL near goal. Continue statin.   COVID-19 Education: The signs and symptoms of COVID-19 were discussed with the patient and how to seek care for testing (follow up with PCP or arrange E-visit).  The importance of social distancing was discussed today.  Time:   Today, I have spent 15 minutes with the patient with telehealth technology discussing the above problems.     Medication Adjustments/Labs and Tests Ordered: Current medicines are reviewed at length with the patient today.  Concerns regarding medicines are outlined above.   Tests Ordered: No orders of the defined types were placed in this encounter.   Medication Changes: No orders of the defined types were placed in this encounter.   Disposition:  Follow up in 6 month(s)  Signed, Lauree Chandler, MD  08/17/2018 8:51 AM    [facility]

## 2018-08-20 ENCOUNTER — Telehealth: Payer: Self-pay

## 2018-08-20 NOTE — Telephone Encounter (Signed)
**Note De-Identified [name] Obfuscation** This is a f/u to call from 06/14/2018: I called the pt to see if he has received his [employer] pt asst application in the mail from [place] yet and he stated he has not checked his mail but will today. I have advised him to call me back on Wednesday this week 08/22/18 to let me know if he has still not received his application to call me.  The pt is going into the donut hole soon and he requesting his [employer] pt asst application now to be prepared.

## 2018-11-13 DIAGNOSIS — N182 Chronic kidney disease, stage 2 (mild): Secondary | ICD-10-CM | POA: Diagnosis not present

## 2018-11-13 DIAGNOSIS — E785 Hyperlipidemia, unspecified: Secondary | ICD-10-CM | POA: Diagnosis not present

## 2018-11-13 DIAGNOSIS — J449 Chronic obstructive pulmonary disease, unspecified: Secondary | ICD-10-CM | POA: Diagnosis not present

## 2018-11-13 DIAGNOSIS — I1310 Hypertensive heart and chronic kidney disease without heart failure, with stage 1 through stage 4 chronic kidney disease, or unspecified chronic kidney disease: Secondary | ICD-10-CM | POA: Diagnosis not present

## 2018-11-13 DIAGNOSIS — E1122 Type 2 diabetes mellitus with diabetic chronic kidney disease: Secondary | ICD-10-CM | POA: Diagnosis not present

## 2018-11-21 ENCOUNTER — Other Ambulatory Visit: Payer: Self-pay | Admitting: Cardiovascular Disease

## 2019-01-03 DIAGNOSIS — E1140 Type 2 diabetes mellitus with diabetic neuropathy, unspecified: Secondary | ICD-10-CM | POA: Diagnosis not present

## 2019-01-03 DIAGNOSIS — N182 Chronic kidney disease, stage 2 (mild): Secondary | ICD-10-CM | POA: Diagnosis not present

## 2019-01-03 DIAGNOSIS — I1310 Hypertensive heart and chronic kidney disease without heart failure, with stage 1 through stage 4 chronic kidney disease, or unspecified chronic kidney disease: Secondary | ICD-10-CM | POA: Diagnosis not present

## 2019-01-03 DIAGNOSIS — E1122 Type 2 diabetes mellitus with diabetic chronic kidney disease: Secondary | ICD-10-CM | POA: Diagnosis not present

## 2019-01-09 DIAGNOSIS — C679 Malignant neoplasm of bladder, unspecified: Secondary | ICD-10-CM | POA: Diagnosis not present

## 2019-01-09 DIAGNOSIS — N3281 Overactive bladder: Secondary | ICD-10-CM | POA: Diagnosis not present

## 2019-01-09 DIAGNOSIS — N401 Enlarged prostate with lower urinary tract symptoms: Secondary | ICD-10-CM | POA: Diagnosis not present

## 2019-01-24 ENCOUNTER — Other Ambulatory Visit: Payer: Self-pay | Admitting: Physician Assistant

## 2019-02-14 DIAGNOSIS — E1122 Type 2 diabetes mellitus with diabetic chronic kidney disease: Secondary | ICD-10-CM | POA: Diagnosis not present

## 2019-02-14 DIAGNOSIS — I1310 Hypertensive heart and chronic kidney disease without heart failure, with stage 1 through stage 4 chronic kidney disease, or unspecified chronic kidney disease: Secondary | ICD-10-CM | POA: Diagnosis not present

## 2019-02-14 DIAGNOSIS — I480 Paroxysmal atrial fibrillation: Secondary | ICD-10-CM | POA: Diagnosis not present

## 2019-02-14 DIAGNOSIS — E785 Hyperlipidemia, unspecified: Secondary | ICD-10-CM | POA: Diagnosis not present

## 2019-02-14 DIAGNOSIS — E559 Vitamin D deficiency, unspecified: Secondary | ICD-10-CM | POA: Diagnosis not present

## 2019-02-14 DIAGNOSIS — Z720 Tobacco use: Secondary | ICD-10-CM | POA: Diagnosis not present

## 2019-02-14 DIAGNOSIS — N182 Chronic kidney disease, stage 2 (mild): Secondary | ICD-10-CM | POA: Diagnosis not present

## 2019-02-14 DIAGNOSIS — Z6824 Body mass index (BMI) 24.0-24.9, adult: Secondary | ICD-10-CM | POA: Diagnosis not present

## 2019-02-14 DIAGNOSIS — J449 Chronic obstructive pulmonary disease, unspecified: Secondary | ICD-10-CM | POA: Diagnosis not present

## 2019-02-21 ENCOUNTER — Other Ambulatory Visit: Payer: Self-pay | Admitting: Cardiovascular Disease

## 2019-02-28 ENCOUNTER — Other Ambulatory Visit: Payer: Self-pay | Admitting: Cardiovascular Disease

## 2019-03-27 ENCOUNTER — Other Ambulatory Visit: Payer: Self-pay | Admitting: Cardiovascular Disease

## 2019-03-27 NOTE — Telephone Encounter (Signed)
Pt last saw Dr Angelena Form 08/17/18 telemedicine Covid-19, last labs 02/14/19 Creat 0.93 per [facility] at [facility] age 73, weight 68kg, CrCl 67.03, based on CrCl pt is on appropriate dosage of Xarelto 20mg  QD.  Will refill rx.

## 2019-05-21 DIAGNOSIS — E785 Hyperlipidemia, unspecified: Secondary | ICD-10-CM | POA: Diagnosis not present

## 2019-05-21 DIAGNOSIS — N182 Chronic kidney disease, stage 2 (mild): Secondary | ICD-10-CM | POA: Diagnosis not present

## 2019-05-21 DIAGNOSIS — J449 Chronic obstructive pulmonary disease, unspecified: Secondary | ICD-10-CM | POA: Diagnosis not present

## 2019-05-21 DIAGNOSIS — Z139 Encounter for screening, unspecified: Secondary | ICD-10-CM | POA: Diagnosis not present

## 2019-05-21 DIAGNOSIS — I480 Paroxysmal atrial fibrillation: Secondary | ICD-10-CM | POA: Diagnosis not present

## 2019-05-21 DIAGNOSIS — Z720 Tobacco use: Secondary | ICD-10-CM | POA: Diagnosis not present

## 2019-05-21 DIAGNOSIS — E559 Vitamin D deficiency, unspecified: Secondary | ICD-10-CM | POA: Diagnosis not present

## 2019-05-21 DIAGNOSIS — E1122 Type 2 diabetes mellitus with diabetic chronic kidney disease: Secondary | ICD-10-CM | POA: Diagnosis not present

## 2019-05-21 DIAGNOSIS — I1310 Hypertensive heart and chronic kidney disease without heart failure, with stage 1 through stage 4 chronic kidney disease, or unspecified chronic kidney disease: Secondary | ICD-10-CM | POA: Diagnosis not present

## 2019-05-24 DIAGNOSIS — L859 Epidermal thickening, unspecified: Secondary | ICD-10-CM | POA: Diagnosis not present

## 2019-05-24 DIAGNOSIS — L989 Disorder of the skin and subcutaneous tissue, unspecified: Secondary | ICD-10-CM | POA: Diagnosis not present

## 2019-05-24 DIAGNOSIS — L570 Actinic keratosis: Secondary | ICD-10-CM | POA: Diagnosis not present

## 2019-06-10 DIAGNOSIS — Z125 Encounter for screening for malignant neoplasm of prostate: Secondary | ICD-10-CM | POA: Diagnosis not present

## 2019-06-10 DIAGNOSIS — E785 Hyperlipidemia, unspecified: Secondary | ICD-10-CM | POA: Diagnosis not present

## 2019-06-10 DIAGNOSIS — Z9181 History of falling: Secondary | ICD-10-CM | POA: Diagnosis not present

## 2019-06-10 DIAGNOSIS — Z0000 Encounter for general adult medical examination without abnormal findings: Secondary | ICD-10-CM | POA: Diagnosis not present

## 2019-06-10 DIAGNOSIS — Z1331 Encounter for screening for depression: Secondary | ICD-10-CM | POA: Diagnosis not present

## 2019-06-30 ENCOUNTER — Other Ambulatory Visit: Payer: Self-pay | Admitting: Cardiovascular Disease

## 2019-07-01 ENCOUNTER — Telehealth: Payer: Self-pay | Admitting: Nurse Practitioner

## 2019-07-01 MED ORDER — NITROGLYCERIN 0.4 MG SL SUBL: 0.4000 mg | SUBLINGUAL_TABLET | SUBLINGUAL | 0 refills | Status: DC | PRN

## 2019-07-01 MED ORDER — LOSARTAN POTASSIUM 100 MG PO TABS: 100.0000 mg | ORAL_TABLET | Freq: Every day | ORAL | 0 refills | Status: DC

## 2019-07-01 MED ORDER — CARVEDILOL 12.5 MG PO TABS: 12.5000 mg | ORAL_TABLET | Freq: Two times a day (BID) | ORAL | 0 refills | Status: DC

## 2019-07-01 MED ORDER — RIVAROXABAN 20 MG PO TABS: 20.0000 mg | ORAL_TABLET | Freq: Every day | ORAL | 0 refills | Status: DC

## 2019-07-01 MED ORDER — CLOPIDOGREL BISULFATE 75 MG PO TABS: 75.0000 mg | ORAL_TABLET | Freq: Every day | ORAL | 0 refills | Status: DC

## 2019-07-01 NOTE — Telephone Encounter (Signed)
Received call from staff in lobby that patient has walked in requesting cardiac meds be refilled and sent to [facility]. I advised that patient needs to schedule an appointment and that refills will be sent up to time of appointment. I scheduled patient for an appointment in May and advised him that he will need to keep appointment for future refills. I heard verification from patient through staff member.

## 2019-07-09 DIAGNOSIS — N401 Enlarged prostate with lower urinary tract symptoms: Secondary | ICD-10-CM | POA: Diagnosis not present

## 2019-07-09 DIAGNOSIS — C679 Malignant neoplasm of bladder, unspecified: Secondary | ICD-10-CM | POA: Diagnosis not present

## 2019-07-14 ENCOUNTER — Other Ambulatory Visit: Payer: Self-pay | Admitting: Physician Assistant

## 2019-07-17 ENCOUNTER — Other Ambulatory Visit: Payer: Self-pay | Admitting: Cardiovascular Disease

## 2019-08-13 ENCOUNTER — Other Ambulatory Visit: Payer: Self-pay | Admitting: Cardiovascular Disease

## 2019-08-14 NOTE — Telephone Encounter (Signed)
Ten Tablets given as patient is overdue for f/u appt If patient presents to upcoming 5/6 appt then full refill will be provided

## 2019-08-19 DIAGNOSIS — J449 Chronic obstructive pulmonary disease, unspecified: Secondary | ICD-10-CM | POA: Diagnosis not present

## 2019-08-19 DIAGNOSIS — E1122 Type 2 diabetes mellitus with diabetic chronic kidney disease: Secondary | ICD-10-CM | POA: Diagnosis not present

## 2019-08-19 DIAGNOSIS — E785 Hyperlipidemia, unspecified: Secondary | ICD-10-CM | POA: Diagnosis not present

## 2019-08-19 DIAGNOSIS — N182 Chronic kidney disease, stage 2 (mild): Secondary | ICD-10-CM | POA: Diagnosis not present

## 2019-08-21 NOTE — Progress Notes (Deleted)
No chief complaint on file.  History of Present Illness: 74 yo male with history of COPD, atrial fibrillation, CAD, ischemic cardiomyopathy, HTN, hyperlipidemia, ongoing tobacco abuse, mild mitral regurgitation and PAD who is here today for follow up. His PV issues have been followed by Dr. Donnetta Hutching in [facility].  He has had bladder cancer and multiple surgeries on his bladder. Last carotid artery dopplers August 2016 with mild bilateral disease. He is known to have CAD with inferior MI in 1992, PTCA of RCA at that time. He had an anterior MI in 2000 and had a stent placed in the LAD. In 2006, he had an inferior MI with a Taxus stent placed in the RCA. In September 2006, repeat cath with occlusion of RCA in the stent. 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 Echo 2017 with stable mild LV systolic dysfunction, [id number] (previous 35-40%). Nuclear stress test September 2017 with no ischemia but LVEF of 23%. He was seen here in our office 03/04/16 by Truitt Merle, NP with c/o dizziness, cough, weakness and was found to be in atrial fibrillation with slow rate. Cardiac monitor Feb 2018 with NSVT. He was seen in EP clinic by Dr. Rayann Heman. Beta blocker changed to Coreg and Xarelto was started. Dizziness resolved when Lasix was stopped. He was seen in our office 09/25/17 by Cecilie Kicks, NP and c/o dyspnea and fatigue. He also noted lack of appetite and 20 lb weight loss. He also reported soreness in the muscles of his chest, not like prior cardiac pain. Echo 10/06/17 with LVEF of 30% with hypokinesis of the inferolateral, inferior and inferoseptal walls. No significant valve disease noted. Nuclear stress test was identical to stress test in 2017 showing large scar in the inferior and apical walls with LVEF 29%. Of note, CT scan in 2017 in [facility] with evidence of emphysema but no masses or suspicious nodules. Cardiac cath July 2019 with a severe stenosis in the proximal LAD treated  with a drug eluting stent. The RCA is chronically occluded and fills from left to right collaterals. Echo October 2019 with LVEF=25-30%, mild MR.    He is here today for follow up. The patient denies any chest pain, dyspnea, palpitations, lower extremity edema, orthopnea, PND, dizziness, near syncope or syncope.   Primary Care Physician: Nicoletta Dress, MD Sherlyn Lees, FNP-BC)  Past Medical History:  Diagnosis Date  . Bladder cancer ([place])    resection x3  . Chronic systolic CHF (congestive heart failure) ([place])   . Cigarette smoker   . Coronary artery disease    status post DMI RX Taxus stent RCA 2006 with susequent Stent LAD and subsequent  stent thrombosis RCA unable to be opened 2006 -neg mv 10/2008, 10/16/17 ISR to pLAD with PTCA/DES, CTO of RCA with collaterals, EF 25%  . Hyperlipidemia   . Hypertension   . Ischemic cardiomyopathy    ejection fraction of 40-45%  . Lumbar spinal stenosis   . Myocardial infarction ([place])    "I've had 4" (10/16/2017)  . NSVT (nonsustained ventricular tachycardia) ([place])   . Paroxysmal atrial fibrillation ([facility])   . PVC's (premature ventricular contractions)   . PVD (peripheral vascular disease) ([place])    status post bilateral aortobifemoral bypass in1992 with recent fem to fembypass April  2011 per DR. Early  . Type II diabetes mellitus ([place])     Past Surgical History:  Procedure Laterality Date  . AORTA - BILATERAL FEMORAL ARTERY  BYPASS GRAFT Bilateral 1992  . APPENDECTOMY    . BACK SURGERY    . CORONARY ANGIOPLASTY WITH STENT PLACEMENT     taxus stent  placed into his right coronary artery; stent placed to the LAD.    [name] CORONARY STENT INTERVENTION N/A 10/16/2017   Procedure: CORONARY STENT INTERVENTION;  Surgeon: Burnell Blanks, MD;  Location: [facility];  Service: Cardiovascular;  Laterality: N/A;  . FEMORAL-FEMORAL BYPASS GRAFT  07/2009  . LUMBAR MICRODISCECTOMY  12/31/08  . RIGHT/LEFT HEART CATH AND CORONARY ANGIOGRAPHY N/A  10/16/2017   Procedure: RIGHT/LEFT HEART CATH AND CORONARY ANGIOGRAPHY;  Surgeon: Burnell Blanks, MD;  Location: [facility];  Service: Cardiovascular;  Laterality: N/A;  . SHOULDER OPEN ROTATOR CUFF REPAIR Left     Current Outpatient Medications  Medication Sig Dispense Refill  . BESIVANCE 0.6 % SUSP Place 1 drop into the right eye 3 (three) times daily.  1  . budesonide-formoterol (SYMBICORT) 160-4.5 MCG/ACT inhaler Inhale 2 puffs into the lungs 2 (two) times daily as needed.     . carvedilol (COREG) 12.5 MG tablet Take 1 tablet (12.5 mg total) by mouth 2 (two) times daily. 180 tablet 0  . cholecalciferol (VITAMIN D) 1000 UNITS tablet Take 1,000 Units by mouth daily.      . clopidogrel (PLAVIX) 75 MG tablet TAKE 1 TABLET BY MOUTH EVERY DAY WITH BREAKFAST **NEED APPT FOR REFILLS* 10 tablet 0  . DUREZOL 0.05 % EMUL Place 1 drop into the right eye 3 (three) times daily.  1  . fluticasone (FLONASE) 50 MCG/ACT nasal spray Place 2 sprays into both nostrils daily as needed for allergies.     [name] gabapentin (NEURONTIN) 400 MG capsule Take 400 mg by mouth 2 (two) times daily.    [name] loratadine (CLARITIN) 10 MG tablet Take 10 mg by mouth 2 (two) times daily.    [name] losartan (COZAAR) 100 MG tablet Take 1 tablet (100 mg total) by mouth daily. 90 tablet 0  . Melatonin 10 MG CAPS Take 10 mg by mouth at bedtime.     . metFORMIN (GLUCOPHAGE) 500 MG tablet Take 500 mg by mouth 2 (two) times daily with a meal.    . nitroGLYCERIN (NITROSTAT) 0.4 MG SL tablet Place 1 tablet (0.4 mg total) under the tongue every 5 (five) minutes as needed. 75 tablet 0  . Nutritional Supplements (JUICE PLUS FIBRE PO) Take 2 tablets by mouth 2 (two) times daily.    . rivaroxaban (XARELTO) 20 MG TABS tablet Take 1 tablet (20 mg total) by mouth daily with supper. 90 tablet 0  . rosuvastatin (CRESTOR) 20 MG tablet TAKE 1 TABLET BY MOUTH EVERY DAY 90 tablet 0  . tamsulosin (FLOMAX) 0.4 MG CAPS capsule Take 0.4 mg by mouth 2  (two) times daily. Take as directed    . trimethoprim-polymyxin b (POLYTRIM) ophthalmic solution Place 2 drops into the right eye 3 (three) times daily.     No current facility-administered medications for this visit.    Allergies  Allergen Reactions  . Lisinopril Swelling and Rash    Rash - face and tounge swell    Social History   Socioeconomic History  . Marital status: Married    Spouse name: Not on file  . Number of children: 1  . Years of education: Not on file  . Highest education level: Not on file  Occupational History  . 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 Smoking status: Current Every Day Smoker    Packs/day: 0.50    Years: 56.00    Pack years: 28.00    Types: Cigars  . Smokeless tobacco: Former Systems developer    Types: Chew  Substance and Sexual Activity  . Alcohol use: Yes    Comment: 10/16/2017 "1 beer q 2-3 months"  . Drug use: Never  . Sexual activity: Not Currently  Other Topics Concern  . Not on file  Social History Narrative   Lives with girlfriend in [place].     Social Determinants of Health   Financial Resource Strain:   . Difficulty of Paying Living Expenses:   Food Insecurity:   . Worried About Charity fundraiser in the Last Year:   . Arboriculturist in the Last Year:   Transportation Needs:   . Film/video editor (Medical):   [name] Lack of Transportation (Non-Medical):   Physical Activity:   . Days of Exercise per Week:   . Minutes of Exercise per Session:   Stress:   . Feeling of Stress :   Social Connections:   . Frequency of Communication with Friends and Family:   . Frequency of Social Gatherings with Friends and Family:   . Attends Religious Services:   . Active Member of Clubs or Organizations:   . Attends Archivist Meetings:   [name] Marital Status:   Intimate Partner Violence:   . Fear of Current or Ex-Partner:   . Emotionally Abused:   [name] Physically Abused:   . Sexually  Abused:     Family History  Problem Relation Age of Onset  . Coronary artery disease Unknown   . Diabetes Unknown   . Cardiomyopathy Mother   . Heart disease Mother   . Hyperlipidemia Mother   . Coronary artery disease Father   . Stroke Father   . Diabetes Father   . Heart disease Father        before age 64  . Hyperlipidemia Father   . Heart attack Father   . Diabetes Sister   . Heart disease Sister        before age 78  . Hyperlipidemia Sister   . Heart attack Sister     Review of Systems:  As stated in the HPI and otherwise negative.   There were no vitals taken for this visit.  Physical Examination:  General: Well developed, well nourished, NAD  HEENT: OP clear, mucus membranes moist  SKIN: warm, dry. No rashes. Neuro: No focal deficits  Musculoskeletal: Muscle strength 5/5 all ext  Psychiatric: Mood and affect normal  Neck: No JVD, no carotid bruits, no thyromegaly, no lymphadenopathy.  Lungs:Clear bilaterally, no wheezes, rhonci, crackles Cardiovascular: Regular rate and rhythm. No murmurs, gallops or rubs. Abdomen:Soft. Bowel sounds present. Non-tender.  Extremities: No lower extremity edema. Pulses are 2 + in the bilateral DP/PT.  Echo October 2019:   - Left ventricle: The cavity size was mildly dilated. Wall  thickness was normal. Systolic function was severely reduced. The  estimated ejection fraction was in the range of 25% to 30%.  Diffuse hypokinesis with regional variation (worse inferiorly).  Doppler parameters are consistent with diastolic dysfunction and  elevated LV filling pressure.  - Aortic valve: Calcified leaflets. Trileaflet. No stenosis of  regurgitation.  - Mitral valve: Mildly thickened leaflets . There was mild  regurgitation.  - Left atrium: Moderately dilated.  - Right atrium: The atrium was mildly dilated.  -  Inferior vena cava: The vessel was dilated. The respirophasic  diameter changes were blunted (< 50%),  consistent with elevated  central venous pressure.   Impressions:   - Compared to a prior study in 09/2017, there has been no  significant changes.   EKG:  EKG is *** ordered today. The ekg ordered today demonstrates  Recent Labs: No results found for requested labs within last 8760 hours.   Lipid Panel Followed in primary care   Wt Readings from Last 3 Encounters:  08/17/18 150 lb (68 kg)  02/07/18 163 lb 3.2 oz (74 kg)  11/07/17 160 lb (72.6 kg)     Other studies Reviewed: Additional studies/ records that were reviewed today include: . Review of the above records demonstrates:    Assessment and Plan:   1. CAD without angina: No chest pain. Continue statin, beta blocker and Plavix. He is not on ASA since he is on Xarelto.     2. CARDIOMYOPATHY, ISCHEMIC: LVEF 25-30% by echo October 2019. Continue beta blocker and ARB.  Will not consider Entresto as he is NYHA class 1.    3. PAD: This had been followed in [facility].    4. Atrial fibrillation, paroxysmal: He is in sinus today. Will continue Xarelto and beta blocker.    5. TOBACCO ABUSE: Smoking cessation advised.    6. HTN: BP is controlled. Continue current therapy.   7. Carotid artery disease: Mild bilateral disease by dopplers August 2016. *** ? Repeat carotid artery dopplers now.   8. Hyperlipidemia: Lipids followed in primary care. LDL ***. Continue statin   Current medicines are reviewed at length with the patient today.  The patient does not have concerns regarding medicines.  The following changes have been made:  no change  Labs/ tests ordered today include:   No orders of the defined types were placed in this encounter.   Disposition:   FU with me in 6 months  Signed, Lauree Chandler, MD 08/21/2019 4:03 PM    [facility] [address], [place], [place]  [address] Phone: [phone]; Fax: [phone]

## 2019-08-22 ENCOUNTER — Ambulatory Visit: Payer: Medicare Other | Admitting: Cardiovascular Disease

## 2019-08-23 ENCOUNTER — Other Ambulatory Visit: Payer: Self-pay | Admitting: Cardiovascular Disease

## 2019-09-05 ENCOUNTER — Other Ambulatory Visit: Payer: Self-pay | Admitting: Cardiovascular Disease

## 2019-09-19 ENCOUNTER — Other Ambulatory Visit: Payer: Self-pay | Admitting: Cardiovascular Disease

## 2019-09-27 ENCOUNTER — Telehealth: Payer: Self-pay

## 2019-09-27 ENCOUNTER — Other Ambulatory Visit: Payer: Self-pay

## 2019-09-27 NOTE — Telephone Encounter (Addendum)
Xarelto 20mg  refill request received. Pt is 74 years old, [name], Crea- 0.96 on 08/19/2019 via [facility] from [facility], last seen by Dr. Angelena Form on 08/17/2018 via Telemedicine-NEEDS APPT, pt had an appt scheduled on 08/22/2019 and did not come to the appt, Diagnosis-Afib, CrCl-64.21ml/min; doseage is appropiate but pt needs an appt with Cardiologist.  Called pt but had to leave a message for the pt to call back to schedule an appt with Cardiologist.  Sent the schedulers a message regarding the pt needing an appt as well.

## 2019-09-27 NOTE — Telephone Encounter (Signed)
NOTES ON FILE FROM [facility] [phone], SENT REFERRAL TO SCHEDULING

## 2019-09-30 ENCOUNTER — Other Ambulatory Visit: Payer: Self-pay | Admitting: *Deleted

## 2019-09-30 NOTE — Progress Notes (Signed)
Cardiology Office Note    Date:  10/01/2019   ID:  WHEELER INCORVAIA, DOB 1945-05-08, MRN [id number]  PCP:  Nicoletta Dress, MD  Cardiologist: Lauree Chandler, MD EPS: None  Chief Complaint  Patient presents with  . Follow-up    History of Present Illness:  [name] is a 74 y.o. male with history of COPD, atrial fibrillation, CAD status post inferior MI in 1992 treated with PTCA of the RCA, anterior MI 2000 and stent to the LAD, 2006 inferior MI with Taxus stent in the RCA,, repeat cath 12/2004 occlusion of RCA and stent-no ischemia on NST 12/2015 but EF 23%, cath 10/2017 severe proximal LAD treated with DES RCA chronically occluded fills from left to right collaterals, ischemic cardiomyopathy, HTN, hyperlipidemia, ongoing tobacco abuse and PAD.  NSVT on Holter 2018 seen by Dr. Rayann Heman and placed on Coreg and Xarelto  Patient last saw Dr. Angelena Form via telemedicine 08/17/2018 and was doing well with his only concern being the cost of Xarelto.  Patient comes in for f/u. Hasn't been vaccinated for covid 19 and has no intent on getting it. Denies chest tightness. Has chronic dyspnea on exertion from COPD- still smoking 1/2-3/4 ppd. No palpitations or edema. Having trouble affording xarelto when in the donut hole but ok for now. He likes to hunt but has to stop because of bilateral hip pain.   Past Medical History:  Diagnosis Date  . Bladder cancer ([place])    resection x3  . Chronic systolic CHF (congestive heart failure) ([place])   . Cigarette smoker   . Coronary artery disease    status post DMI RX Taxus stent RCA 2006 with susequent Stent LAD and subsequent  stent thrombosis RCA unable to be opened 2006 -neg mv 10/2008, 10/16/17 ISR to pLAD with PTCA/DES, CTO of RCA with collaterals, EF 25%  . Hyperlipidemia   . Hypertension   . Ischemic cardiomyopathy    ejection fraction of 40-45%  . Lumbar spinal stenosis   . Myocardial infarction ([place])    "I've had 4" (10/16/2017)  . NSVT  (nonsustained ventricular tachycardia) ([place])   . Paroxysmal atrial fibrillation ([facility])   . PVC's (premature ventricular contractions)   . PVD (peripheral vascular disease) ([place])    status post bilateral aortobifemoral bypass in1992 with recent fem to fembypass April  2011 per DR. Early  . Type II diabetes mellitus ([place])     Past Surgical History:  Procedure Laterality Date  . AORTA - BILATERAL FEMORAL ARTERY BYPASS GRAFT Bilateral 1992  . APPENDECTOMY    . BACK SURGERY    . CORONARY ANGIOPLASTY WITH STENT PLACEMENT     taxus stent  placed into his right coronary artery; stent placed to the LAD.    [name] CORONARY STENT INTERVENTION N/A 10/16/2017   Procedure: CORONARY STENT INTERVENTION;  Surgeon: Burnell Blanks, MD;  Location: [facility];  Service: Cardiovascular;  Laterality: N/A;  . FEMORAL-FEMORAL BYPASS GRAFT  07/2009  . LUMBAR MICRODISCECTOMY  12/31/08  . RIGHT/LEFT HEART CATH AND CORONARY ANGIOGRAPHY N/A 10/16/2017   Procedure: RIGHT/LEFT HEART CATH AND CORONARY ANGIOGRAPHY;  Surgeon: Burnell Blanks, MD;  Location: [facility];  Service: Cardiovascular;  Laterality: N/A;  . SHOULDER OPEN ROTATOR CUFF REPAIR Left     Current Medications: Current Meds  Medication Sig  . BESIVANCE 0.6 % SUSP Place 1 drop into the right eye 3 (three) times daily.  . budesonide-formoterol (SYMBICORT) 160-4.5 MCG/ACT inhaler Inhale 2 puffs into  the lungs 2 (two) times daily as needed.   . carvedilol (COREG) 12.5 MG tablet Take 1 tablet (12.5 mg total) by mouth 2 (two) times daily.  . cholecalciferol (VITAMIN D) 1000 UNITS tablet Take 1,000 Units by mouth daily.    . clopidogrel (PLAVIX) 75 MG tablet TAKE 1 TABLET BY MOUTH EVERY DAY WITH BREAKFAST **NEED APPT FOR REFILLS*  . DUREZOL 0.05 % EMUL Place 1 drop into the right eye 3 (three) times daily.  . fluticasone (FLONASE) 50 MCG/ACT nasal spray Place 2 sprays into both nostrils daily as needed for allergies.   [name] gabapentin  (NEURONTIN) 400 MG capsule Take 400 mg by mouth 2 (two) times daily.  [name] loratadine (CLARITIN) 10 MG tablet Take 10 mg by mouth 2 (two) times daily.  [name] losartan (COZAAR) 100 MG tablet Take 1 tablet (100 mg total) by mouth daily. Please make overdue appt with Dr. Angelena Form before anymore refills. 1st attempt  . Melatonin 10 MG CAPS Take 10 mg by mouth at bedtime.   . metFORMIN (GLUCOPHAGE) 500 MG tablet Take 500 mg by mouth 2 (two) times daily with a meal.  . nitroGLYCERIN (NITROSTAT) 0.4 MG SL tablet Place 1 tablet (0.4 mg total) under the tongue every 5 (five) minutes as needed.  . Nutritional Supplements (JUICE PLUS FIBRE PO) Take 2 tablets by mouth 2 (two) times daily.  . Omega-3 Fatty Acids (OMEGA-3 CF PO) Take 1 tablet by mouth daily.  . rivaroxaban (XARELTO) 20 MG TABS tablet Take 1 tablet (20 mg total) by mouth daily with supper.  . rosuvastatin (CRESTOR) 20 MG tablet TAKE 1 TABLET BY MOUTH EVERY DAY  . tamsulosin (FLOMAX) 0.4 MG CAPS capsule Take 0.4 mg by mouth 2 (two) times daily. Take as directed  . Testosterone POWD Take 1 tablet by mouth daily.  [name] trimethoprim-polymyxin b (POLYTRIM) ophthalmic solution Place 2 drops into the right eye 3 (three) times daily.     Allergies:   Lisinopril   Social History   Socioeconomic History  . Marital status: Married    Spouse name: Not on file  . Number of children: 1  . Years of education: Not on file  . Highest education level: Not on file  Occupational History  . 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 Smoking status: Current Every Day Smoker    Packs/day: 0.50    Years: 56.00    Pack years: 28.00    Types: Cigars  . Smokeless tobacco: Former Systems developer    Types: Secondary school teacher  . Vaping Use: Never used  Substance and Sexual Activity  . Alcohol use: Yes    Comment: 10/16/2017 "1 beer q 2-3 months"  . Drug use: Never  . Sexual activity: Not Currently  Other Topics Concern  . Not on file   Social History Narrative   Lives with girlfriend in [place].     Social Determinants of Health   Financial Resource Strain:   . Difficulty of Paying Living Expenses:   Food Insecurity:   . Worried About Charity fundraiser in the Last Year:   . Arboriculturist in the Last Year:   Transportation Needs:   . Film/video editor (Medical):   [name] Lack of Transportation (Non-Medical):   Physical Activity:   . Days of Exercise per Week:   . Minutes of Exercise per Session:   Stress:   . Feeling of Stress :  Social Connections:   . Frequency of Communication with Friends and Family:   . Frequency of Social Gatherings with Friends and Family:   . Attends Religious Services:   . Active Member of Clubs or Organizations:   . Attends Archivist Meetings:   [name] Marital Status:      Family History:  The patient's family history includes Cardiomyopathy in his mother; Coronary artery disease in his father and unknown relative; Diabetes in his father, sister, and unknown relative; Heart attack in his father and sister; Heart disease in his father, mother, and sister; Hyperlipidemia in his father, mother, and sister; Stroke in his father.   ROS:   Please see the history of present illness.    ROS All other systems reviewed and are negative.   PHYSICAL EXAM:   VS:  BP (!) 156/70   Pulse 77   Ht 5\' 7"  (1.702 m)   Wt 149 lb (67.6 kg)   SpO2 97%   BMI 23.34 kg/m   Physical Exam  GEN: Thin, in no acute distress  Neck: Left carotid bruit no JVD,  or masses Cardiac:RRR; with skipping and 1/6 systolic murmur at the left sternal border no rubs, or gallops  Respiratory: Decreased breath sounds but clear to auscultation bilaterally, normal work of breathing GI: soft, nontender, nondistended, + BS Ext: without cyanosis, clubbing, or edema, Good distal pulses on right, not as strong on the left but present Neuro:  Alert and Oriented x 3 Psych: euthymic mood, full affect  Wt Readings  from Last 3 Encounters:  10/01/19 149 lb (67.6 kg)  08/17/18 150 lb (68 kg)  02/07/18 163 lb 3.2 oz (74 kg)      Studies/Labs Reviewed:   EKG:  EKG is  ordered today.  The ekg ordered today demonstrates normal sinus rhythm with PVCs, nonspecific ST-T wave changes, no acute change  Recent Labs: No results found for requested labs within last 8760 hours.   Lipid Panel    Component Value Date/Time   CHOL 129 02/09/2018 0721   TRIG 77 02/09/2018 0721   HDL 39 (L) 02/09/2018 0721   CHOLHDL 3.3 02/09/2018 0721   CHOLHDL 6.1 CALC 11/07/2006 0958   VLDL 20 11/07/2006 0958   LDLCALC 75 02/09/2018 0721    Additional studies/ records that were reviewed today include:    Echo 02/09/18: - Left ventricle: The cavity size was mildly dilated. Wall   thickness was normal. Systolic function was severely reduced. The   estimated ejection fraction was in the range of 25% to 30%.   Diffuse hypokinesis with regional variation (worse inferiorly).   Doppler parameters are consistent with diastolic dysfunction and   elevated LV filling pressure. - Aortic valve: Calcified leaflets. Trileaflet. No stenosis of   regurgitation. - Mitral valve: Mildly thickened leaflets . There was mild   regurgitation. - Left atrium: Moderately dilated. - Right atrium: The atrium was mildly dilated. - Inferior vena cava: The vessel was dilated. The respirophasic   diameter changes were blunted (< 50%), consistent with elevated   central venous pressure.   Impressions:   - Compared to a prior study in 09/2017, there has been no   significant changes.  Right and left heart cath 10/2017  Prox RCA lesion is 100% stenosed.  Mid RCA to Dist RCA lesion is 100% stenosed.  Ost LM to Mid LM lesion is 30% stenosed.  Ost LAD to Prox LAD lesion is 90% stenosed.  A drug-eluting stent was successfully placed  using a STENT SYNERGY DES [id number].  Post intervention, there is a 0% residual stenosis.  LV end diastolic  pressure is normal.   1. Chronic occlusion RCA with distal vessel filling from left to right collaterals.  2. Severe stenosis proximal LAD in the old stented segment (bare metal stent was placed in 2000).  3. Successful PTCA/DES x  Proximal LAD 4. Normal filling pressures   Recommendations: Will continue ASA and Plavix for one month along with Xarelto. Restart Xarelto in am. Will use ASA for only one month then will stop and continue Plavix with Xarelto for one year. Continue beta blocker and statin. Likely d/c home tomorrow am.       ASSESSMENT:    1. Coronary artery disease involving native coronary artery of native heart without angina pectoris   2. Ischemic cardiomyopathy   3. Paroxysmal atrial fibrillation ([facility])   4. PAD (peripheral artery disease) ([place])   5. Tobacco abuse   6. Essential hypertension   7. Hyperlipidemia, unspecified hyperlipidemia type   8. Bilateral carotid artery stenosis   9. Carotid bruit, unspecified laterality   10. Educated about COVID-19 virus infection      PLAN:  In order of problems listed above:   CAD last cath 2019 with DES to the LAD and chronically occluded RCA with left-to-right collaterals on Plavix statin and beta-blocker.  No aspirin since he is on Xarelto-no angina  Ischemic cardiomyopathy ejection fraction 25 to 30% on echo 01/2018.  Referred to EP to discuss ICD but patient did not want to pursue that.  Currently compensated without heart failure  Paroxysmal atrial fibrillation on Xarelto and has trouble affording when he is in the donut hole but currently affordable.  I have asked him to call us if he has trouble.  Normal sinus rhythm today with PVCs.  Creatinine 0.96 08/19/2019  PAD followed by Dr. Donnetta Hutching requested that he go back with his bilateral hip pain and carotid bruit  Tobacco abuse continues to smoke a half a pack to three quarters of a pack of cigarettes daily.  Smoking cessation discussed  Hypertension blood pressure  elevated.  He is on high-dose losartan 100 mg daily as well as carvedilol 12.5 mg twice daily.  We will add Norvasc 5 mg once daily-he says he will follow up with PCP for blood pressure since he lives closer.  Hyperlipidemia LDL 53 08/19/2019 at PCP  Carotid artery disease mild in 2016-1 to 39% bilateral left carotid bruit will recheck carotid Dopplers  Educated on COVID-19 vaccine  Medication Adjustments/Labs and Tests Ordered: Current medicines are reviewed at length with the patient today.  Concerns regarding medicines are outlined above.  Medication changes, Labs and Tests ordered today are listed in the Patient Instructions below. Patient Instructions  Medication Instructions:  Your physician has recommended you make the following change in your medication:  1. START AMLODIPINE 5 MG DAILY.  *If you need a refill on your cardiac medications before your next appointment, please call your pharmacy*   Lab Work: NONE If you have labs (blood work) drawn today and your tests are completely normal, you will receive your results only by: [name] MyChart Message (if you have MyChart) OR . A paper copy in the mail If you have any lab test that is abnormal or we need to change your treatment, we will call you to review the results.   Testing/Procedures: Your physician has requested that you have a carotid duplex. This test is an ultrasound of the  carotid arteries in your neck. It looks at blood flow through these arteries that supply the brain with blood. Allow one hour for this exam. There are no restrictions or special instructions.  Follow-Up: At [facility], you and your health needs are our priority.  As part of our continuing mission to provide you with exceptional heart care, we have created designated Provider Care Teams.  These Care Teams include your primary Cardiologist (physician) and Advanced Practice Providers (APPs -  Physician Assistants and Nurse Practitioners) who all work  together to provide you with the care you need, when you need it.  We recommend signing up for the patient portal called "MyChart".  Sign up information is provided on this After Visit Summary.  MyChart is used to connect with patients for Virtual Visits (Telemedicine).  Patients are able to view lab/test results, encounter notes, upcoming appointments, etc.  Non-urgent messages can be sent to your provider as well.   To learn more about what you can do with MyChart, go to [email or web address].    Your next appointment:   6 month(s)  The format for your next appointment:   In Person  Provider:   You may see Lauree Chandler, MD or one of the following Advanced Practice Providers on your designated Care Team:    Melina Copa, PA-C  Ermalinda Barrios, PA-C    Other Instructions  Steps to Quit Smoking Smoking tobacco is the leading cause of preventable death. It can affect almost every organ in the body. Smoking puts you and people around you at risk for many serious, long-lasting (chronic) diseases. Quitting smoking can be hard, but it is one of the best things that you can do for your health. It is never too late to quit. How do I get ready to quit? When you decide to quit smoking, make a plan to help you succeed. Before you quit:  Pick a date to quit. Set a date within the next 2 weeks to give you time to prepare.  Write down the reasons why you are quitting. Keep this list in places where you will see it often.  Tell your family, friends, and co-workers that you are quitting. Their support is important.  Talk with your doctor about the choices that may help you quit.  Find out if your health insurance will pay for these treatments.  Know the people, places, things, and activities that make you want to smoke (triggers). Avoid them. What first steps can I take to quit smoking?  Throw away all cigarettes at home, at work, and in your car.  Throw away the things that you use  when you smoke, such as ashtrays and lighters.  Clean your car. Make sure to empty the ashtray.  Clean your home, including curtains and carpets. What can I do to help me quit smoking? Talk with your doctor about taking medicines and seeing a counselor at the same time. You are more likely to succeed when you do both.  If you are pregnant or breastfeeding, talk with your doctor about counseling or other ways to quit smoking. Do not take medicine to help you quit smoking unless your doctor tells you to do so. To quit smoking: Quit right away  Quit smoking totally, instead of slowly cutting back on how much you smoke over a period of time.  Go to counseling. You are more likely to quit if you go to counseling sessions regularly. Take medicine You may take medicines to help you quit.  Some medicines need a prescription, and some you can buy over-the-counter. Some medicines may contain a drug called nicotine to replace the nicotine in cigarettes. Medicines may:  Help you to stop having the desire to smoke (cravings).  Help to stop the problems that come when you stop smoking (withdrawal symptoms). Your doctor may ask you to use:  Nicotine patches, gum, or lozenges.  Nicotine inhalers or sprays.  Non-nicotine medicine that is taken by mouth. Find resources Find resources and other ways to help you quit smoking and remain smoke-free after you quit. These resources are most helpful when you use them often. They include:  Online chats with a Social worker.  Phone quitlines.  Printed Furniture conservator/restorer.  Support groups or group counseling.  Text messaging programs.  Mobile phone apps. Use apps on your mobile phone or tablet that can help you stick to your quit plan. There are many free apps for mobile phones and tablets as well as websites. Examples include Quit Guide from the [employer] and smokefree.gov  What things can I do to make it easier to quit?   Talk to your family and friends.  Ask them to support and encourage you.  Call a phone quitline (1-800-QUIT-NOW), reach out to support groups, or work with a Social worker.  Ask people who smoke to not smoke around you.  Avoid places that make you want to smoke, such as: ? Bars. ? Parties. ? Smoke-break areas at work.  Spend time with people who do not smoke.  Lower the stress in your life. Stress can make you want to smoke. Try these things to help your stress: ? Getting regular exercise. ? Doing deep-breathing exercises. ? Doing yoga. ? Meditating. ? Doing a body scan. To do this, close your eyes, focus on one area of your body at a time from head to toe. Notice which parts of your body are tense. Try to relax the muscles in those areas. How will I feel when I quit smoking? Day 1 to 3 weeks Within the first 24 hours, you may start to have some problems that come from quitting tobacco. These problems are very bad 2-3 days after you quit, but they do not often last for more than 2-3 weeks. You may get these symptoms:  Mood swings.  Feeling restless, nervous, angry, or annoyed.  Trouble concentrating.  Dizziness.  Strong desire for high-sugar foods and nicotine.  Weight gain.  Trouble pooping (constipation).  Feeling like you may vomit (nausea).  Coughing or a sore throat.  Changes in how the medicines that you take for other issues work in your body.  Depression.  Trouble sleeping (insomnia). Week 3 and afterward After the first 2-3 weeks of quitting, you may start to notice more positive results, such as:  Better sense of smell and taste.  Less coughing and sore throat.  Slower heart rate.  Lower blood pressure.  Clearer skin.  Better breathing.  Fewer sick days. Quitting smoking can be hard. Do not give up if you fail the first time. Some people need to try a few times before they succeed. Do your best to stick to your quit plan, and talk with your doctor if you have any questions or  concerns. Summary  Smoking tobacco is the leading cause of preventable death. Quitting smoking can be hard, but it is one of the best things that you can do for your health.  When you decide to quit smoking, make a plan to help you succeed.  Quit smoking right away, not slowly over a period of time.  When you start quitting, seek help from your doctor, family, or friends. This information is not intended to replace advice given to you by your health care provider. Make sure you discuss any questions you have with your health care provider. Document Revised: 12/28/2018 Document Reviewed: 06/23/2018 Elsevier Patient Education  [place].  COVID-19 Vaccine Information can be found at: ShippingScam.co.uk For questions related to vaccine distribution or appointments, please email vaccine@[email or web address] .[email or web address] or call 6148168226.          Signed, Ermalinda Barrios, PA-C  10/01/2019 2:50 PM    [facility] [address], [place], [place]  [address] Phone: [phone]; Fax: [phone]

## 2019-10-01 ENCOUNTER — Encounter: Payer: Self-pay | Admitting: Physician Assistant

## 2019-10-01 ENCOUNTER — Ambulatory Visit: Payer: Medicare Other | Admitting: Physician Assistant

## 2019-10-01 ENCOUNTER — Other Ambulatory Visit: Payer: Self-pay

## 2019-10-01 VITALS — BP 156/70 | HR 77 | Ht 67.0 in | Wt 149.0 lb

## 2019-10-01 DIAGNOSIS — I10 Essential (primary) hypertension: Secondary | ICD-10-CM

## 2019-10-01 DIAGNOSIS — I255 Ischemic cardiomyopathy: Secondary | ICD-10-CM

## 2019-10-01 DIAGNOSIS — I480 Paroxysmal atrial fibrillation: Secondary | ICD-10-CM | POA: Diagnosis not present

## 2019-10-01 DIAGNOSIS — E785 Hyperlipidemia, unspecified: Secondary | ICD-10-CM

## 2019-10-01 DIAGNOSIS — Z7189 Other specified counseling: Secondary | ICD-10-CM

## 2019-10-01 DIAGNOSIS — I739 Peripheral vascular disease, unspecified: Secondary | ICD-10-CM

## 2019-10-01 DIAGNOSIS — Z720 Tobacco use: Secondary | ICD-10-CM

## 2019-10-01 DIAGNOSIS — R0989 Other specified symptoms and signs involving the circulatory and respiratory systems: Secondary | ICD-10-CM

## 2019-10-01 DIAGNOSIS — I6523 Occlusion and stenosis of bilateral carotid arteries: Secondary | ICD-10-CM

## 2019-10-01 DIAGNOSIS — I2510 Atherosclerotic heart disease of native coronary artery without angina pectoris: Secondary | ICD-10-CM | POA: Diagnosis not present

## 2019-10-01 MED ORDER — AMLODIPINE BESYLATE 5 MG PO TABS
5.0000 mg | ORAL_TABLET | Freq: Every day | ORAL | 3 refills | Status: DC
Start: 2019-10-01 — End: 2021-07-08

## 2019-10-01 MED ORDER — RIVAROXABAN 20 MG PO TABS: 20.0000 mg | ORAL_TABLET | Freq: Every day | ORAL | 0 refills | Status: DC

## 2019-10-01 NOTE — Telephone Encounter (Signed)
Xarelto 20mg  refill request received. Pt is 74 years old, weight-68kg, Crea-0.96 on 08/19/2019 via [facility] from [facility], last seen by Dr. Angelena Form on 08/17/2018 and has an appt on today 10/01/2019 with Estella Husk at 145pm, Diagnosis-Afib, CrCl-64.60ml/min; Dose is appropriate based on dosing criteria. Will send in refill to requested pharmacy.

## 2019-10-01 NOTE — Patient Instructions (Addendum)
Medication Instructions:  Your physician has recommended you make the following change in your medication:  1. START AMLODIPINE 5 MG DAILY.  *If you need a refill on your cardiac medications before your next appointment, please call your pharmacy*   Lab Work: NONE If you have labs (blood work) drawn today and your tests are completely normal, you will receive your results only by:  [name] (if you have MyChart) OR  A paper copy in the mail If you have any lab test that is abnormal or we need to change your treatment, we will call you to review the results.   Testing/Procedures: Your physician has requested that you have a carotid duplex. This test is an ultrasound of the carotid arteries in your neck. It looks at blood flow through these arteries that supply the brain with blood. Allow one hour for this exam. There are no restrictions or special instructions.  Follow-Up: At [facility], you and your health needs are our priority.  As part of our continuing mission to provide you with exceptional heart care, we have created designated Provider Care Teams.  These Care Teams include your primary Cardiologist (physician) and Advanced Practice Providers (APPs -  Physician Assistants and Nurse Practitioners) who all work together to provide you with the care you need, when you need it.  We recommend signing up for the patient portal called "MyChart".  Sign up information is provided on this After Visit Summary.  MyChart is used to connect with patients for Virtual Visits (Telemedicine).  Patients are able to view lab/test results, encounter notes, upcoming appointments, etc.  Non-urgent messages can be sent to your provider as well.   To learn more about what you can do with MyChart, go to [email or web address].    Your next appointment:   6 month(s)  The format for your next appointment:   In Person  Provider:   You may see Lauree Chandler, MD or one of the following  Advanced Practice Providers on your designated Care Team:    Melina Copa, PA-C  Ermalinda Barrios, PA-C    Other Instructions  Steps to Quit Smoking Smoking tobacco is the leading cause of preventable death. It can affect almost every organ in the body. Smoking puts you and people around you at risk for many serious, long-lasting (chronic) diseases. Quitting smoking can be hard, but it is one of the best things that you can do for your health. It is never too late to quit. How do I get ready to quit? When you decide to quit smoking, make a plan to help you succeed. Before you quit:  Pick a date to quit. Set a date within the next 2 weeks to give you time to prepare.  Write down the reasons why you are quitting. Keep this list in places where you will see it often.  Tell your family, friends, and co-workers that you are quitting. Their support is important.  Talk with your doctor about the choices that may help you quit.  Find out if your health insurance will pay for these treatments.  Know the people, places, things, and activities that make you want to smoke (triggers). Avoid them. What first steps can I take to quit smoking?  Throw away all cigarettes at home, at work, and in your car.  Throw away the things that you use when you smoke, such as ashtrays and lighters.  Clean your car. Make sure to empty the ashtray.  Clean your home, including curtains  and carpets. What can I do to help me quit smoking? Talk with your doctor about taking medicines and seeing a counselor at the same time. You are more likely to succeed when you do both.  If you are pregnant or breastfeeding, talk with your doctor about counseling or other ways to quit smoking. Do not take medicine to help you quit smoking unless your doctor tells you to do so. To quit smoking: Quit right away  Quit smoking totally, instead of slowly cutting back on how much you smoke over a period of time.  Go to counseling.  You are more likely to quit if you go to counseling sessions regularly. Take medicine You may take medicines to help you quit. Some medicines need a prescription, and some you can buy over-the-counter. Some medicines may contain a drug called nicotine to replace the nicotine in cigarettes. Medicines may:  Help you to stop having the desire to smoke (cravings).  Help to stop the problems that come when you stop smoking (withdrawal symptoms). Your doctor may ask you to use:  Nicotine patches, gum, or lozenges.  Nicotine inhalers or sprays.  Non-nicotine medicine that is taken by mouth. Find resources Find resources and other ways to help you quit smoking and remain smoke-free after you quit. These resources are most helpful when you use them often. They include:  Online chats with a Social worker.  Phone quitlines.  Printed Furniture conservator/restorer.  Support groups or group counseling.  Text messaging programs.  Mobile phone apps. Use apps on your mobile phone or tablet that can help you stick to your quit plan. There are many free apps for mobile phones and tablets as well as websites. Examples include Quit Guide from the [employer] and smokefree.gov  What things can I do to make it easier to quit?   Talk to your family and friends. Ask them to support and encourage you.  Call a phone quitline (1-800-QUIT-NOW), reach out to support groups, or work with a Social worker.  Ask people who smoke to not smoke around you.  Avoid places that make you want to smoke, such as: ? Bars. ? Parties. ? Smoke-break areas at work.  Spend time with people who do not smoke.  Lower the stress in your life. Stress can make you want to smoke. Try these things to help your stress: ? Getting regular exercise. ? Doing deep-breathing exercises. ? Doing yoga. ? Meditating. ? Doing a body scan. To do this, close your eyes, focus on one area of your body at a time from head to toe. Notice which parts of your body are  tense. Try to relax the muscles in those areas. How will I feel when I quit smoking? Day 1 to 3 weeks Within the first 24 hours, you may start to have some problems that come from quitting tobacco. These problems are very bad 2-3 days after you quit, but they do not often last for more than 2-3 weeks. You may get these symptoms:  Mood swings.  Feeling restless, nervous, angry, or annoyed.  Trouble concentrating.  Dizziness.  Strong desire for high-sugar foods and nicotine.  Weight gain.  Trouble pooping (constipation).  Feeling like you may vomit (nausea).  Coughing or a sore throat.  Changes in how the medicines that you take for other issues work in your body.  Depression.  Trouble sleeping (insomnia). Week 3 and afterward After the first 2-3 weeks of quitting, you may start to notice more positive results, such as:  Better sense of smell and taste.  Less coughing and sore throat.  Slower heart rate.  Lower blood pressure.  Clearer skin.  Better breathing.  Fewer sick days. Quitting smoking can be hard. Do not give up if you fail the first time. Some people need to try a few times before they succeed. Do your best to stick to your quit plan, and talk with your doctor if you have any questions or concerns. Summary  Smoking tobacco is the leading cause of preventable death. Quitting smoking can be hard, but it is one of the best things that you can do for your health.  When you decide to quit smoking, make a plan to help you succeed.  Quit smoking right away, not slowly over a period of time.  When you start quitting, seek help from your doctor, family, or friends. This information is not intended to replace advice given to you by your health care provider. Make sure you discuss any questions you have with your health care provider. Document Revised: 12/28/2018 Document Reviewed: 06/23/2018 Elsevier Patient Education  [place].  COVID-19 Vaccine  Information can be found at: ShippingScam.co.uk For questions related to vaccine distribution or appointments, please email vaccine@[email or web address] [email or web address] or call [phone].

## 2019-10-01 NOTE — Telephone Encounter (Signed)
Pt has a pending appt with Estella Husk today at 145pm, Xarelto refill sent from duplicate request to mail order.

## 2019-10-08 ENCOUNTER — Other Ambulatory Visit: Payer: Self-pay | Admitting: Cardiovascular Disease

## 2019-10-23 ENCOUNTER — Ambulatory Visit (HOSPITAL_COMMUNITY)
Admission: RE | Admit: 2019-10-23 | Discharge: 2019-10-23 | Disposition: A | Discharge status: Home / Self Care | Payer: Medicare Other | Source: Ambulatory Visit | Attending: Cardiology | Admitting: Cardiology

## 2019-10-23 ENCOUNTER — Other Ambulatory Visit: Payer: Self-pay

## 2019-10-23 DIAGNOSIS — I6523 Occlusion and stenosis of bilateral carotid arteries: Secondary | ICD-10-CM | POA: Diagnosis not present

## 2019-10-23 DIAGNOSIS — R0989 Other specified symptoms and signs involving the circulatory and respiratory systems: Secondary | ICD-10-CM | POA: Diagnosis not present

## 2019-11-09 ENCOUNTER — Other Ambulatory Visit: Payer: Self-pay | Admitting: Cardiovascular Disease

## 2019-11-22 ENCOUNTER — Other Ambulatory Visit: Payer: Self-pay

## 2019-11-22 MED ORDER — CLOPIDOGREL BISULFATE 75 MG PO TABS: ORAL_TABLET | ORAL | 3 refills | Status: DC

## 2019-11-22 MED ORDER — RIVAROXABAN 20 MG PO TABS: 20.0000 mg | ORAL_TABLET | Freq: Every day | ORAL | 1 refills | Status: DC

## 2019-11-22 NOTE — Telephone Encounter (Signed)
Pt last saw Ermalinda Barrios, PA 10/01/19, last labs 08/19/19 Creat 0.96 at [facility] per [facility], age 74, weight 67.6kg, CrCl 64.55, based on CrCl pt is on appropriate dosage of Xarelto 20mg  QD.  Will refill rx.

## 2019-12-10 DIAGNOSIS — Z6824 Body mass index (BMI) 24.0-24.9, adult: Secondary | ICD-10-CM | POA: Diagnosis not present

## 2019-12-10 DIAGNOSIS — M542 Cervicalgia: Secondary | ICD-10-CM | POA: Diagnosis not present

## 2019-12-18 DIAGNOSIS — E785 Hyperlipidemia, unspecified: Secondary | ICD-10-CM | POA: Diagnosis not present

## 2019-12-18 DIAGNOSIS — N182 Chronic kidney disease, stage 2 (mild): Secondary | ICD-10-CM | POA: Diagnosis not present

## 2019-12-18 DIAGNOSIS — E1122 Type 2 diabetes mellitus with diabetic chronic kidney disease: Secondary | ICD-10-CM | POA: Diagnosis not present

## 2019-12-18 DIAGNOSIS — J449 Chronic obstructive pulmonary disease, unspecified: Secondary | ICD-10-CM | POA: Diagnosis not present

## 2019-12-31 ENCOUNTER — Other Ambulatory Visit: Payer: Self-pay | Admitting: Cardiovascular Disease

## 2020-01-09 DIAGNOSIS — R8290 Unspecified abnormal findings in urine: Secondary | ICD-10-CM | POA: Diagnosis not present

## 2020-01-09 DIAGNOSIS — N401 Enlarged prostate with lower urinary tract symptoms: Secondary | ICD-10-CM | POA: Diagnosis not present

## 2020-01-09 DIAGNOSIS — C679 Malignant neoplasm of bladder, unspecified: Secondary | ICD-10-CM | POA: Diagnosis not present

## 2020-03-03 DIAGNOSIS — E119 Type 2 diabetes mellitus without complications: Secondary | ICD-10-CM | POA: Diagnosis not present

## 2020-03-03 DIAGNOSIS — H2703 Aphakia, bilateral: Secondary | ICD-10-CM | POA: Diagnosis not present

## 2020-03-29 NOTE — Progress Notes (Signed)
Chief Complaint  Patient presents with  . Follow-up    CAD   History of Present Illness: 74 yo male with history of COPD, atrial fibrillation, CAD, ischemic cardiomyopathy, HTN, hyperlipidemia, ongoing tobacco abuse and PAD who is here today for follow up. His PV issues have been followed by Dr. Donnetta Hutching in [facility].  He has had bladder cancer and multiple surgeries on his bladder. Last carotid artery dopplers August 2016 with mild bilateral disease. He is known to have CAD with inferior MI in 1992, PTCA of RCA at that time. He had an anterior MI in 2000 and had a stent placed in the LAD. In 2006, he had an inferior MI with a Taxus stent placed in the RCA. In September 2006, repeat cath with occlusion of RCA in the stent. 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 Echo 2017 with stable mild LV systolic dysfunction, KDTO=67-12% (previous 35-40%). Nuclear stress test September 2017 with no ischemia but LVEF of 23%. He was seen here in our office 03/04/16 by Truitt Merle, NP with c/o dizziness, cough, weakness and was found to be in atrial fibrillation with slow rate. Cardiac monitor Feb 2018 with NSVT. He was seen in EP clinic by Dr. Rayann Heman. Beta blocker changed to Coreg and Xarelto was started. Dizziness resolved when Lasix was stopped. He was seen in our office 09/25/17 by Cecilie Kicks, NP and c/o dyspnea and fatigue. He also noted lack of appetite and 20 lb weight loss. He also reported soreness in the muscles of his chest, not like prior cardiac pain. Echo 10/06/17 with LVEF of 30% with hypokinesis of the inferolateral, inferior and inferoseptal walls. No significant valve disease noted. Nuclear stress test was identical to stress test in 2017 showing large scar in the inferior and apical walls with LVEF 29%. Of note, CT scan in 2017 in [facility] with evidence of emphysema but no masses or suspicious nodules. Cardiac cath July 2019 with a severe stenosis in the proximal LAD  treated with a drug eluting stent. The RCA is chronically occluded and fills from left to right collaterals. Echo October 2019 with LVEF=25-30%.   He is here today for follow up. The patient denies any chest pain, dyspnea, palpitations, lower extremity edema, orthopnea, PND, dizziness, near syncope or syncope.    Primary Care Physician: Nicoletta Dress, MD Sherlyn Lees, FNP-BC)  Past Medical History:  Diagnosis Date  . Bladder cancer ([place])    resection x3  . Chronic systolic CHF (congestive heart failure) ([place])   . Cigarette smoker   . Coronary artery disease    status post DMI RX Taxus stent RCA 2006 with susequent Stent LAD and subsequent  stent thrombosis RCA unable to be opened 2006 -neg mv 10/2008, 10/16/17 ISR to pLAD with PTCA/DES, CTO of RCA with collaterals, EF 25%  . Hyperlipidemia   . Hypertension   . Ischemic cardiomyopathy    ejection fraction of 40-45%  . Lumbar spinal stenosis   . Myocardial infarction ([place])    "I've had 4" (10/16/2017)  . NSVT (nonsustained ventricular tachycardia) ([place])   . Paroxysmal atrial fibrillation ([facility])   . PVC's (premature ventricular contractions)   . PVD (peripheral vascular disease) ([place])    status post bilateral aortobifemoral bypass in1992 with recent fem to fembypass April  2011 per DR. Early  . Type II diabetes mellitus ([place])     Past Surgical History:  Procedure Laterality Date  . AORTA -  BILATERAL FEMORAL ARTERY BYPASS GRAFT Bilateral 1992  . APPENDECTOMY    . BACK SURGERY    . CORONARY ANGIOPLASTY WITH STENT PLACEMENT     taxus stent  placed into his right coronary artery; stent placed to the LAD.    [name] CORONARY STENT INTERVENTION N/A 10/16/2017   Procedure: CORONARY STENT INTERVENTION;  Surgeon: Burnell Blanks, MD;  Location: [facility];  Service: Cardiovascular;  Laterality: N/A;  . FEMORAL-FEMORAL BYPASS GRAFT  07/2009  . LUMBAR MICRODISCECTOMY  12/31/08  . RIGHT/LEFT HEART CATH AND CORONARY ANGIOGRAPHY N/A  10/16/2017   Procedure: RIGHT/LEFT HEART CATH AND CORONARY ANGIOGRAPHY;  Surgeon: Burnell Blanks, MD;  Location: [facility];  Service: Cardiovascular;  Laterality: N/A;  . SHOULDER OPEN ROTATOR CUFF REPAIR Left     Current Outpatient Medications  Medication Sig Dispense Refill  . budesonide-formoterol (SYMBICORT) 160-4.5 MCG/ACT inhaler Inhale 2 puffs into the lungs 2 (two) times daily as needed.     . carvedilol (COREG) 12.5 MG tablet TAKE 1 TABLET BY MOUTH 2 TIMES DAILY 180 tablet 0  . clopidogrel (PLAVIX) 75 MG tablet TAKE 1 TABLET BY MOUTH EVERY DAY WITH BREAKFAST 90 tablet 3  . losartan (COZAAR) 100 MG tablet TAKE 1 TABLET BY MOUTH DAILY 90 tablet 1  . metFORMIN (GLUCOPHAGE) 1000 MG tablet Take 1,000 mg by mouth 2 (two) times daily.    . nitroGLYCERIN (NITROSTAT) 0.4 MG SL tablet Place 1 tablet (0.4 mg total) under the tongue every 5 (five) minutes as needed. 75 tablet 0  . Nutritional Supplements (JUICE PLUS FIBRE PO) Take 2 tablets by mouth 2 (two) times daily.    . rivaroxaban (XARELTO) 20 MG TABS tablet Take 1 tablet (20 mg total) by mouth daily with supper. 90 tablet 1  . rosuvastatin (CRESTOR) 20 MG tablet TAKE 1 TABLET BY MOUTH EVERY DAY 90 tablet 3  . amLODipine (NORVASC) 5 MG tablet Take 1 tablet (5 mg total) by mouth daily. 90 tablet 3   No current facility-administered medications for this visit.    Allergies  Allergen Reactions  . Lisinopril Swelling and Rash    Rash - face and tounge swell    Social History   Socioeconomic History  . Marital status: Married    Spouse name: Not on file  . Number of children: 1  . Years of education: Not on file  . Highest education level: Not on file  Occupational History  . 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 Smoking status: Current Every Day Smoker    Packs/day: 0.50    Years: 56.00    Pack years: 28.00    Types: Cigars  . Smokeless tobacco: Former Systems developer     Types: Secondary school teacher  . Vaping Use: Never used  Substance and Sexual Activity  . Alcohol use: Yes    Comment: 10/16/2017 "1 beer q 2-3 months"  . Drug use: Never  . Sexual activity: Not Currently  Other Topics Concern  . Not on file  Social History Narrative   Lives with girlfriend in [place].     Social Determinants of Health   Financial Resource Strain: Not on file  Food Insecurity: Not on file  Transportation Needs: Not on file  Physical Activity: Not on file  Stress: Not on file  Social Connections: Not on file  Intimate Partner Violence: Not on file    Family History  Problem Relation  Age of Onset  . Coronary artery disease Unknown   . Diabetes Unknown   . Cardiomyopathy Mother   . Heart disease Mother   . Hyperlipidemia Mother   . Coronary artery disease Father   . Stroke Father   . Diabetes Father   . Heart disease Father        before age 42  . Hyperlipidemia Father   . Heart attack Father   . Diabetes Sister   . Heart disease Sister        before age 16  . Hyperlipidemia Sister   . Heart attack Sister     Review of Systems:  As stated in the HPI and otherwise negative.   BP (!) 160/80   Pulse 70   Ht 5\' 7"  (1.702 m)   Wt 154 lb 12.8 oz (70.2 kg)   SpO2 99%   BMI 24.25 kg/m   Physical Examination:  General: Well developed, well nourished, NAD  HEENT: OP clear, mucus membranes moist  SKIN: warm, dry. No rashes. Neuro: No focal deficits  Musculoskeletal: Muscle strength 5/5 all ext  Psychiatric: Mood and affect normal  Neck: No JVD, no carotid bruits, no thyromegaly, no lymphadenopathy.  Lungs:Clear bilaterally, no wheezes, rhonci, crackles Cardiovascular: Regular rate and rhythm. No murmurs, gallops or rubs. Abdomen:Soft. Bowel sounds present. Non-tender.  Extremities: No lower extremity edema. Pulses are 2 + in the bilateral DP, PT  Echo October 2019:  - Left ventricle: The cavity size was mildly dilated. Wall  thickness was normal.  Systolic function was severely reduced. The  estimated ejection fraction was in the range of 25% to 30%.  Diffuse hypokinesis with regional variation (worse inferiorly).  Doppler parameters are consistent with diastolic dysfunction and  elevated LV filling pressure.  - Aortic valve: Calcified leaflets. Trileaflet. No stenosis of  regurgitation.  - Mitral valve: Mildly thickened leaflets . There was mild  regurgitation.  - Left atrium: Moderately dilated.  - Right atrium: The atrium was mildly dilated.  - Inferior vena cava: The vessel was dilated. The respirophasic  diameter changes were blunted (< 50%), consistent with elevated  central venous pressure.   Nuclear StressTest June 2019:  Nuclear stress EF: 29%.  Defect 1: There is a large defect of severe severity present in the basal inferoseptal, basal inferior, mid inferoseptal, mid inferior, apical anterior, apical septal, apical inferior, apical lateral and apex location.  This is a high risk study.  Findings consistent with prior myocardial infarction.  The left ventricular ejection fraction is severely decreased (<30%).   High risk study due to a very large old scar and severely reduced left ventricular systolic function. No reversible ischemia is seen.  EKG:  EKG is ordered today. The ekg ordered today demonstrates atrial fibrillation  Recent Labs: No results found for requested labs within last 8760 hours.   Lipid Panel Followed in primary care   Wt Readings from Last 3 Encounters:  03/30/20 154 lb 12.8 oz (70.2 kg)  10/01/19 149 lb (67.6 kg)  08/17/18 150 lb (68 kg)     Other studies Reviewed: Additional studies/ records that were reviewed today include: . Review of the above records demonstrates:    Assessment and Plan:   1. CAD without angina: No chest pain. Continue Plavix, beta blocker and statin. No ASA since he is on Xarelto.    2. CARDIOMYOPATHY, ISCHEMIC: LVEF 30% by echo in  October 2019. Continue beta blocker and ARB.  He declined referral to EP to discuss  an ICD in 2019.   3. PAD: This had been followed in [facility]. He needs to see Dr. Donnetta Hutching.   4. Atrial fibrillation, paroxysmal: He is in atrial fibrillation today. Heart rate 70 bpm. Continue beta blocker and Xarelto.    5. TOBACCO ABUSE: I have counseled him on smoking cessation  6. HTN: BP is well controlled. Continue current medications  7. Carotid artery disease: Mild bilateral disease by dopplers July 2021  8. Hyperlipidemia: LDL 65. Continue statin   Current medicines are reviewed at length with the patient today.  The patient does not have concerns regarding medicines.  The following changes have been made:  no change  Labs/ tests ordered today include:   No orders of the defined types were placed in this encounter.   Disposition:   FU with me in 12 months  Signed, Lauree Chandler, MD 03/30/2020 9:38 AM    [facility] [address], [place], [place]  [address] Phone: [phone]; Fax: [phone]

## 2020-03-30 ENCOUNTER — Other Ambulatory Visit: Payer: Self-pay

## 2020-03-30 ENCOUNTER — Ambulatory Visit: Payer: Medicare Other | Admitting: Cardiovascular Disease

## 2020-03-30 ENCOUNTER — Encounter: Payer: Self-pay | Admitting: Cardiovascular Disease

## 2020-03-30 VITALS — BP 160/80 | HR 70 | Ht 67.0 in | Wt 154.8 lb

## 2020-03-30 DIAGNOSIS — I480 Paroxysmal atrial fibrillation: Secondary | ICD-10-CM

## 2020-03-30 DIAGNOSIS — I739 Peripheral vascular disease, unspecified: Secondary | ICD-10-CM | POA: Diagnosis not present

## 2020-03-30 DIAGNOSIS — I10 Essential (primary) hypertension: Secondary | ICD-10-CM

## 2020-03-30 DIAGNOSIS — I2510 Atherosclerotic heart disease of native coronary artery without angina pectoris: Secondary | ICD-10-CM | POA: Diagnosis not present

## 2020-03-30 DIAGNOSIS — I6523 Occlusion and stenosis of bilateral carotid arteries: Secondary | ICD-10-CM

## 2020-03-30 DIAGNOSIS — I255 Ischemic cardiomyopathy: Secondary | ICD-10-CM

## 2020-03-30 DIAGNOSIS — E785 Hyperlipidemia, unspecified: Secondary | ICD-10-CM

## 2020-03-30 DIAGNOSIS — Z720 Tobacco use: Secondary | ICD-10-CM

## 2020-04-08 ENCOUNTER — Other Ambulatory Visit: Payer: Self-pay

## 2020-04-08 MED ORDER — CARVEDILOL 12.5 MG PO TABS: 12.5000 mg | ORAL_TABLET | Freq: Two times a day (BID) | ORAL | 3 refills | Status: DC

## 2020-04-14 ENCOUNTER — Other Ambulatory Visit: Payer: Self-pay

## 2020-04-14 MED ORDER — RIVAROXABAN 20 MG PO TABS: 20.0000 mg | ORAL_TABLET | Freq: Every day | ORAL | 1 refills | Status: DC

## 2020-04-14 NOTE — Telephone Encounter (Signed)
Pt last saw Dr Clifton James 03/30/20, last labs 12/18/19 Creat 0.97 at [facility] per [facility], age 74, weight 70.2kg, CrCl 65.34, based on specified criteria pt is on appropriate dosage of Xarelto 20mg  QD.  Will refill rx.

## 2020-04-20 DIAGNOSIS — J329 Chronic sinusitis, unspecified: Secondary | ICD-10-CM | POA: Diagnosis not present

## 2020-04-20 DIAGNOSIS — R059 Cough, unspecified: Secondary | ICD-10-CM | POA: Diagnosis not present

## 2020-04-20 DIAGNOSIS — Z1152 Encounter for screening for COVID-19: Secondary | ICD-10-CM | POA: Diagnosis not present

## 2020-04-23 DIAGNOSIS — E785 Hyperlipidemia, unspecified: Secondary | ICD-10-CM | POA: Diagnosis not present

## 2020-04-23 DIAGNOSIS — E1122 Type 2 diabetes mellitus with diabetic chronic kidney disease: Secondary | ICD-10-CM | POA: Diagnosis not present

## 2020-04-23 DIAGNOSIS — J449 Chronic obstructive pulmonary disease, unspecified: Secondary | ICD-10-CM | POA: Diagnosis not present

## 2020-04-23 DIAGNOSIS — N182 Chronic kidney disease, stage 2 (mild): Secondary | ICD-10-CM | POA: Diagnosis not present

## 2020-05-07 DIAGNOSIS — R6884 Jaw pain: Secondary | ICD-10-CM | POA: Diagnosis not present

## 2020-05-18 DIAGNOSIS — J3489 Other specified disorders of nose and nasal sinuses: Secondary | ICD-10-CM | POA: Diagnosis not present

## 2020-05-18 DIAGNOSIS — R6884 Jaw pain: Secondary | ICD-10-CM | POA: Diagnosis not present

## 2020-05-18 DIAGNOSIS — J329 Chronic sinusitis, unspecified: Secondary | ICD-10-CM | POA: Diagnosis not present

## 2020-05-26 DIAGNOSIS — I2510 Atherosclerotic heart disease of native coronary artery without angina pectoris: Secondary | ICD-10-CM | POA: Diagnosis not present

## 2020-05-26 DIAGNOSIS — E782 Mixed hyperlipidemia: Secondary | ICD-10-CM | POA: Diagnosis not present

## 2020-05-26 DIAGNOSIS — E1169 Type 2 diabetes mellitus with other specified complication: Secondary | ICD-10-CM | POA: Diagnosis not present

## 2020-05-26 DIAGNOSIS — R6884 Jaw pain: Secondary | ICD-10-CM | POA: Diagnosis not present

## 2020-05-28 DIAGNOSIS — Z1152 Encounter for screening for COVID-19: Secondary | ICD-10-CM | POA: Diagnosis not present

## 2020-05-28 DIAGNOSIS — J00 Acute nasopharyngitis [common cold]: Secondary | ICD-10-CM | POA: Diagnosis not present

## 2020-07-16 ENCOUNTER — Other Ambulatory Visit: Payer: Self-pay | Admitting: Cardiovascular Disease

## 2020-08-24 DIAGNOSIS — E1159 Type 2 diabetes mellitus with other circulatory complications: Secondary | ICD-10-CM | POA: Diagnosis not present

## 2020-08-24 DIAGNOSIS — I2510 Atherosclerotic heart disease of native coronary artery without angina pectoris: Secondary | ICD-10-CM | POA: Diagnosis not present

## 2020-08-24 DIAGNOSIS — E785 Hyperlipidemia, unspecified: Secondary | ICD-10-CM | POA: Diagnosis not present

## 2020-08-24 DIAGNOSIS — I10 Essential (primary) hypertension: Secondary | ICD-10-CM | POA: Diagnosis not present

## 2020-10-15 DIAGNOSIS — R37 Sexual dysfunction, unspecified: Secondary | ICD-10-CM | POA: Diagnosis not present

## 2020-10-15 DIAGNOSIS — R531 Weakness: Secondary | ICD-10-CM | POA: Diagnosis not present

## 2020-10-15 DIAGNOSIS — R0602 Shortness of breath: Secondary | ICD-10-CM | POA: Diagnosis not present

## 2020-10-31 ENCOUNTER — Other Ambulatory Visit: Payer: Self-pay | Admitting: Cardiovascular Disease

## 2020-11-26 DIAGNOSIS — E119 Type 2 diabetes mellitus without complications: Secondary | ICD-10-CM | POA: Diagnosis not present

## 2020-11-26 DIAGNOSIS — E785 Hyperlipidemia, unspecified: Secondary | ICD-10-CM | POA: Diagnosis not present

## 2020-12-23 DIAGNOSIS — N401 Enlarged prostate with lower urinary tract symptoms: Secondary | ICD-10-CM | POA: Diagnosis not present

## 2020-12-23 DIAGNOSIS — C679 Malignant neoplasm of bladder, unspecified: Secondary | ICD-10-CM | POA: Diagnosis not present

## 2021-01-04 ENCOUNTER — Other Ambulatory Visit: Payer: Self-pay | Admitting: Cardiovascular Disease

## 2021-01-20 ENCOUNTER — Other Ambulatory Visit: Payer: Self-pay | Admitting: *Deleted

## 2021-01-20 MED ORDER — RIVAROXABAN 20 MG PO TABS: 20.0000 mg | ORAL_TABLET | Freq: Every day | ORAL | 1 refills | Status: DC

## 2021-01-20 NOTE — Telephone Encounter (Signed)
Prescription refill request for Xarelto received.   Indication: Afib  Last office visit: 03/30/2020, [place]  Weight: 70.2 kg  Age: 75 yo  Scr: 1.06, 10/15/2020 CrCl:  59 ml/min   Refill sent

## 2021-03-01 ENCOUNTER — Other Ambulatory Visit: Payer: Self-pay | Admitting: Cardiovascular Disease

## 2021-03-04 DIAGNOSIS — J209 Acute bronchitis, unspecified: Secondary | ICD-10-CM | POA: Diagnosis not present

## 2021-03-04 DIAGNOSIS — Z20828 Contact with and (suspected) exposure to other viral communicable diseases: Secondary | ICD-10-CM | POA: Diagnosis not present

## 2021-03-04 DIAGNOSIS — R0789 Other chest pain: Secondary | ICD-10-CM | POA: Diagnosis not present

## 2021-03-04 DIAGNOSIS — R0600 Dyspnea, unspecified: Secondary | ICD-10-CM | POA: Diagnosis not present

## 2021-03-05 DIAGNOSIS — R062 Wheezing: Secondary | ICD-10-CM | POA: Diagnosis not present

## 2021-03-05 DIAGNOSIS — J111 Influenza due to unidentified influenza virus with other respiratory manifestations: Secondary | ICD-10-CM | POA: Diagnosis not present

## 2021-03-05 DIAGNOSIS — J398 Other specified diseases of upper respiratory tract: Secondary | ICD-10-CM | POA: Diagnosis not present

## 2021-03-16 DIAGNOSIS — I10 Essential (primary) hypertension: Secondary | ICD-10-CM | POA: Diagnosis not present

## 2021-03-16 DIAGNOSIS — Z6823 Body mass index (BMI) 23.0-23.9, adult: Secondary | ICD-10-CM | POA: Diagnosis not present

## 2021-03-16 DIAGNOSIS — I252 Old myocardial infarction: Secondary | ICD-10-CM | POA: Diagnosis not present

## 2021-03-16 DIAGNOSIS — Z0000 Encounter for general adult medical examination without abnormal findings: Secondary | ICD-10-CM | POA: Diagnosis not present

## 2021-03-16 DIAGNOSIS — I2510 Atherosclerotic heart disease of native coronary artery without angina pectoris: Secondary | ICD-10-CM | POA: Diagnosis not present

## 2021-03-16 DIAGNOSIS — E782 Mixed hyperlipidemia: Secondary | ICD-10-CM | POA: Diagnosis not present

## 2021-03-16 DIAGNOSIS — E1151 Type 2 diabetes mellitus with diabetic peripheral angiopathy without gangrene: Secondary | ICD-10-CM | POA: Diagnosis not present

## 2021-03-16 DIAGNOSIS — E1169 Type 2 diabetes mellitus with other specified complication: Secondary | ICD-10-CM | POA: Diagnosis not present

## 2021-04-09 DIAGNOSIS — R109 Unspecified abdominal pain: Secondary | ICD-10-CM | POA: Diagnosis not present

## 2021-04-09 DIAGNOSIS — K769 Liver disease, unspecified: Secondary | ICD-10-CM | POA: Diagnosis not present

## 2021-04-09 DIAGNOSIS — K5792 Diverticulitis of intestine, part unspecified, without perforation or abscess without bleeding: Secondary | ICD-10-CM | POA: Diagnosis not present

## 2021-04-10 DIAGNOSIS — E1151 Type 2 diabetes mellitus with diabetic peripheral angiopathy without gangrene: Secondary | ICD-10-CM | POA: Diagnosis not present

## 2021-04-10 DIAGNOSIS — K769 Liver disease, unspecified: Secondary | ICD-10-CM | POA: Diagnosis not present

## 2021-04-10 DIAGNOSIS — I10 Essential (primary) hypertension: Secondary | ICD-10-CM | POA: Diagnosis not present

## 2021-04-10 DIAGNOSIS — Z23 Encounter for immunization: Secondary | ICD-10-CM | POA: Diagnosis not present

## 2021-04-10 DIAGNOSIS — Z7902 Long term (current) use of antithrombotics/antiplatelets: Secondary | ICD-10-CM | POA: Diagnosis not present

## 2021-04-10 DIAGNOSIS — K5792 Diverticulitis of intestine, part unspecified, without perforation or abscess without bleeding: Secondary | ICD-10-CM | POA: Diagnosis not present

## 2021-04-10 DIAGNOSIS — Z792 Long term (current) use of antibiotics: Secondary | ICD-10-CM | POA: Diagnosis not present

## 2021-04-10 DIAGNOSIS — R109 Unspecified abdominal pain: Secondary | ICD-10-CM | POA: Diagnosis not present

## 2021-04-10 DIAGNOSIS — K5720 Diverticulitis of large intestine with perforation and abscess without bleeding: Secondary | ICD-10-CM | POA: Diagnosis not present

## 2021-04-10 DIAGNOSIS — Z79899 Other long term (current) drug therapy: Secondary | ICD-10-CM | POA: Diagnosis not present

## 2021-04-10 DIAGNOSIS — Z888 Allergy status to other drugs, medicaments and biological substances status: Secondary | ICD-10-CM | POA: Diagnosis not present

## 2021-04-10 DIAGNOSIS — Z87891 Personal history of nicotine dependence: Secondary | ICD-10-CM | POA: Diagnosis not present

## 2021-04-10 DIAGNOSIS — Z7982 Long term (current) use of aspirin: Secondary | ICD-10-CM | POA: Diagnosis not present

## 2021-04-10 DIAGNOSIS — Z7984 Long term (current) use of oral hypoglycemic drugs: Secondary | ICD-10-CM | POA: Diagnosis not present

## 2021-04-10 DIAGNOSIS — I252 Old myocardial infarction: Secondary | ICD-10-CM | POA: Diagnosis not present

## 2021-04-10 DIAGNOSIS — Z7901 Long term (current) use of anticoagulants: Secondary | ICD-10-CM | POA: Diagnosis not present

## 2021-04-10 DIAGNOSIS — Z881 Allergy status to other antibiotic agents status: Secondary | ICD-10-CM | POA: Diagnosis not present

## 2021-04-10 DIAGNOSIS — J449 Chronic obstructive pulmonary disease, unspecified: Secondary | ICD-10-CM | POA: Diagnosis not present

## 2021-05-10 ENCOUNTER — Ambulatory Visit: Payer: Medicare Other | Admitting: Cardiovascular Disease

## 2021-05-10 ENCOUNTER — Other Ambulatory Visit: Payer: Self-pay

## 2021-05-10 ENCOUNTER — Encounter: Payer: Self-pay | Admitting: Cardiovascular Disease

## 2021-05-10 VITALS — BP 148/90 | HR 85 | Ht 67.0 in | Wt 163.8 lb

## 2021-05-10 DIAGNOSIS — I480 Paroxysmal atrial fibrillation: Secondary | ICD-10-CM

## 2021-05-10 DIAGNOSIS — I6523 Occlusion and stenosis of bilateral carotid arteries: Secondary | ICD-10-CM

## 2021-05-10 DIAGNOSIS — I739 Peripheral vascular disease, unspecified: Secondary | ICD-10-CM | POA: Diagnosis not present

## 2021-05-10 DIAGNOSIS — I2510 Atherosclerotic heart disease of native coronary artery without angina pectoris: Secondary | ICD-10-CM

## 2021-05-10 DIAGNOSIS — E785 Hyperlipidemia, unspecified: Secondary | ICD-10-CM

## 2021-05-10 DIAGNOSIS — I10 Essential (primary) hypertension: Secondary | ICD-10-CM

## 2021-05-10 DIAGNOSIS — I255 Ischemic cardiomyopathy: Secondary | ICD-10-CM | POA: Diagnosis not present

## 2021-05-10 DIAGNOSIS — Z720 Tobacco use: Secondary | ICD-10-CM

## 2021-05-10 MED ORDER — ASPIRIN EC 81 MG PO TBEC: 81.0000 mg | DELAYED_RELEASE_TABLET | Freq: Every day | ORAL | 3 refills | Status: AC

## 2021-05-10 NOTE — Patient Instructions (Signed)
Medication Instructions:  Your physician has recommended you make the following change in your medication:  1) stop clopidogrel (Plavix) 2.) start aspirin 81 mg (enteric coated) daily  *If you need a refill on your cardiac medications before your next appointment, please call your pharmacy*   Lab Work: No changes   Testing/Procedures: none   Follow-Up: Pt wishes to transfer care to [facility] in [place].  Please establish appointment in 6 months if pt wishes to change providers.  Other Instructions

## 2021-05-10 NOTE — Progress Notes (Signed)
Chief Complaint  Patient presents with   Follow-up    CAD   History of Present Illness: 76 yo male with history of COPD, atrial fibrillation, CAD, ischemic cardiomyopathy, HTN, hyperlipidemia, ongoing tobacco abuse and PAD who is here today for follow up. His PV issues have been followed by Dr. Donnetta Hutching in [facility].  He has had bladder cancer and multiple surgeries on his bladder. Last carotid artery dopplers July 2021 with mild bilateral disease. He is known to have CAD with inferior MI in 1992, PTCA of RCA at that time. He had an anterior MI in 2000 and had a stent placed in the LAD. In 2006, he had an inferior MI with a Taxus stent placed in the RCA. In September 2006, repeat cath with occlusion of RCA in the stent. 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 Echo 2017 with stable mild LV systolic dysfunction, FUXN=23-55% (previous 35-40%). Nuclear stress test September 2017 with no ischemia but LVEF of 23%. He was seen here in our office 03/04/16 by Truitt Merle, NP with c/o dizziness, cough, weakness and was found to be in atrial fibrillation with slow rate. Cardiac monitor Feb 2018 with NSVT. He was seen in EP clinic by Dr. Rayann Heman. Beta blocker changed to Coreg and Xarelto was started. Dizziness resolved when Lasix was stopped. He was seen in our office 09/25/17 by Cecilie Kicks, NP and c/o dyspnea and fatigue. He also noted lack of appetite and 20 lb weight loss. He also reported soreness in the muscles of his chest, not like prior cardiac pain. Echo 10/06/17 with LVEF of 30% with hypokinesis of the inferolateral, inferior and inferoseptal walls. No significant valve disease noted. Nuclear stress test was identical to stress test in 2017 showing large scar in the inferior and apical walls with LVEF 29%. Of note, CT scan in 2017 in [facility] with evidence of emphysema but no masses or suspicious nodules. Cardiac cath July 2019 with a severe stenosis in the proximal LAD  treated with a drug eluting stent. The RCA is chronically occluded and fills from left to right collaterals. Echo October 2019 with LVEF=25-30%.   He is here today for follow up. The patient denies any chest pain, dyspnea, palpitations, lower extremity edema, orthopnea, PND, dizziness, near syncope or syncope.   Primary Care Physician: Nona Dell, Corene Cornea, MD Sherlyn Lees, FNP-BC)  Past Medical History:  Diagnosis Date   Bladder cancer [facility])    resection x3   Chronic systolic CHF (congestive heart failure) ([place])    Cigarette smoker    Coronary artery disease    status post DMI RX Taxus stent RCA 2006 with susequent Stent LAD and subsequent  stent thrombosis RCA unable to be opened 2006 -neg mv 10/2008, 10/16/17 ISR to pLAD with PTCA/DES, CTO of RCA with collaterals, EF 25%   Hyperlipidemia    Hypertension    Ischemic cardiomyopathy    ejection fraction of 40-45%   Lumbar spinal stenosis    Myocardial infarction ([place])    "I've had 4" (10/16/2017)   NSVT (nonsustained ventricular tachycardia)    Paroxysmal atrial fibrillation ([facility])    PVC's (premature ventricular contractions)    PVD (peripheral vascular disease) ([place])    status post bilateral aortobifemoral bypass [id number] with recent fem to fembypass April  2011 per DR. Early   Type II diabetes mellitus ([place])     Past Surgical History:  Procedure Laterality Date   AORTA - BILATERAL  FEMORAL ARTERY BYPASS GRAFT Bilateral 1992   APPENDECTOMY     BACK SURGERY     CORONARY ANGIOPLASTY WITH STENT PLACEMENT     taxus stent  placed into his right coronary artery; stent placed to the LAD.     CORONARY STENT INTERVENTION N/A 10/16/2017   Procedure: CORONARY STENT INTERVENTION;  Surgeon: Burnell Blanks, MD;  Location: [facility];  Service: Cardiovascular;  Laterality: N/A;   FEMORAL-FEMORAL BYPASS GRAFT  07/2009   LUMBAR MICRODISCECTOMY  12/31/08   RIGHT/LEFT HEART CATH AND CORONARY ANGIOGRAPHY N/A 10/16/2017   Procedure: RIGHT/LEFT  HEART CATH AND CORONARY ANGIOGRAPHY;  Surgeon: Burnell Blanks, MD;  Location: [facility];  Service: Cardiovascular;  Laterality: N/A;   SHOULDER OPEN ROTATOR CUFF REPAIR Left     Current Outpatient Medications  Medication Sig Dispense Refill   amLODipine (NORVASC) 5 MG tablet Take 1 tablet (5 mg total) by mouth daily. 90 tablet 3   aspirin EC 81 MG tablet Take 1 tablet (81 mg total) by mouth daily. Swallow whole. 90 tablet 3   budesonide-formoterol (SYMBICORT) 160-4.5 MCG/ACT inhaler Inhale 2 puffs into the lungs 2 (two) times daily as needed.      carvedilol (COREG) 12.5 MG tablet Take 1 tablet (12.5 mg total) by mouth 2 (two) times daily. 180 tablet 3   losartan (COZAAR) 100 MG tablet TAKE 1 TABLET BY MOUTH DAILY 90 tablet 2   metFORMIN (GLUCOPHAGE) 1000 MG tablet Take 1,000 mg by mouth 2 (two) times daily.     nitroGLYCERIN (NITROSTAT) 0.4 MG SL tablet Place 1 tablet (0.4 mg total) under the tongue every 5 (five) minutes as needed. 75 tablet 0   Nutritional Supplements (JUICE PLUS FIBRE PO) Take 2 tablets by mouth 2 (two) times daily.     rivaroxaban (XARELTO) 20 MG TABS tablet Take 1 tablet (20 mg total) by mouth daily with supper. 90 tablet 1   rosuvastatin (CRESTOR) 20 MG tablet TAKE 1 TABLET BY MOUTH EVERY DAY 90 tablet 1   No current facility-administered medications for this visit.    Allergies  Allergen Reactions   Lisinopril Swelling and Rash    Rash - face and tounge swell    Social History   Socioeconomic History   Marital status: Married    Spouse name: Not on file   Number of children: 1   Years of education: Not on file   Highest education level: Not on file  Occupational History   Occupation: retired    Fish farm manager: RETIRED    Comment: truck driver  Tobacco Use   Smoking status: Every Day    Packs/day: 0.50    Years: 56.00    Pack years: 28.00    Types: Cigars, Cigarettes   Smokeless tobacco: Former    Types: Nurse, children's Use:  Never used  Substance and Sexual Activity   Alcohol use: Yes    Comment: 10/16/2017 "1 beer q 2-3 months"   Drug use: Never   Sexual activity: Not Currently  Other Topics Concern   Not on file  Social History Narrative   Lives with girlfriend in [place].     Social Determinants of Health   Financial Resource Strain: Not on file  Food Insecurity: Not on file  Transportation Needs: Not on file  Physical Activity: Not on file  Stress: Not on file  Social Connections: Not on file  Intimate Partner Violence: Not on file    Family History  Problem Relation Age of Onset   Coronary artery disease Unknown    Diabetes Unknown    Cardiomyopathy Mother    Heart disease Mother    Hyperlipidemia Mother    Coronary artery disease Father    Stroke Father    Diabetes Father    Heart disease Father        before age 83   Hyperlipidemia Father    Heart attack Father    Diabetes Sister    Heart disease Sister        before age 9   Hyperlipidemia Sister    Heart attack Sister     Review of Systems:  As stated in the HPI and otherwise negative.   BP (!) 148/90    Pulse 85    Ht 5\' 7"  (1.702 m)    Wt 163 lb 12.8 oz (74.3 kg)    SpO2 99%    BMI 25.65 kg/m   Physical Examination: General: Well developed, well nourished, NAD  HEENT: OP clear, mucus membranes moist  SKIN: warm, dry. No rashes. Neuro: No focal deficits  Musculoskeletal: Muscle strength 5/5 all ext  Psychiatric: Mood and affect normal  Neck: No JVD, no carotid bruits, no thyromegaly, no lymphadenopathy.  Lungs:Clear bilaterally, no wheezes, rhonci, crackles Cardiovascular: Regular rate and rhythm. No murmurs, gallops or rubs. Abdomen:Soft. Bowel sounds present. Non-tender.  Extremities: No lower extremity edema. Pulses are 2 + in the bilateral DP/PT.  Echo October 2019:  - Left ventricle: The cavity size was mildly dilated. Wall    thickness was normal. Systolic function was severely reduced. The    estimated  ejection fraction was in the range of 25% to 30%.    Diffuse hypokinesis with regional variation (worse inferiorly).    Doppler parameters are consistent with diastolic dysfunction and    elevated LV filling pressure.  - Aortic valve: Calcified leaflets. Trileaflet. No stenosis of    regurgitation.  - Mitral valve: Mildly thickened leaflets . There was mild    regurgitation.  - Left atrium: Moderately dilated.  - Right atrium: The atrium was mildly dilated.  - Inferior vena cava: The vessel was dilated. The respirophasic    diameter changes were blunted (< 50%), consistent with elevated    central venous pressure.   Nuclear StressTest June 2019: Nuclear stress EF: 29%. Defect 1: There is a large defect of severe severity present in the basal inferoseptal, basal inferior, mid inferoseptal, mid inferior, apical anterior, apical septal, apical inferior, apical lateral and apex location. This is a high risk study. Findings consistent with prior myocardial infarction. The left ventricular ejection fraction is severely decreased (<30%).   High risk study due to a very large old scar and severely reduced left ventricular systolic function. No reversible ischemia is seen.  EKG:  EKG is  ordered today. The ekg ordered today demonstrates Sinus, chronic inferior T wave inversion  Recent Labs: No results found for requested labs within last 8760 hours.   Lipid Panel Followed in primary care   Wt Readings from Last 3 Encounters:  05/10/21 163 lb 12.8 oz (74.3 kg)  03/30/20 154 lb 12.8 oz (70.2 kg)  10/01/19 149 lb (67.6 kg)     Other studies Reviewed: Additional studies/ records that were reviewed today include: . Review of the above records demonstrates:    Assessment and Plan:   1. CAD without angina: He is known to have multi-vessel CAD with prior coronary stenting. He has no chest pain suggestive of  angina. Will continue statin and beta blocker.  I will stop Plavix and start ASA  81 mg daily.   2. CARDIOMYOPATHY, ISCHEMIC: LVEF 30% by echo in October 2019. Will continue beta blocker and ARB. He declined referral to EP to discuss an ICD in 2019.   3. PAD: This had been followed in [facility].   4. Atrial fibrillation, paroxysmal: Sinus today. Continue beta blocker and Xarelto.   5. TOBACCO ABUSE: He has stopped smoking.   6. HTN: BP is well controlled at home. He has not taken his BP medication today. Continue current medications  7. Carotid artery disease: Mild bilateral disease by dopplers July 2021  8. Hyperlipidemia: LDL 65 in November 2022. Continue statin   Current medicines are reviewed at length with the patient today.  The patient does not have concerns regarding medicines.  The following changes have been made:  no change  Labs/ tests ordered today include:   Orders Placed This Encounter  Procedures   EKG 12-Lead    Disposition:   He has asked to change to the [facility] group for proximity to his house. We will try to arrange this in 6 months.   Signed, Lauree Chandler, MD 05/10/2021 11:48 AM    [facility] [address], [place], [place]  [address] Phone: [phone]; Fax: [phone]

## 2021-05-11 DIAGNOSIS — K5732 Diverticulitis of large intestine without perforation or abscess without bleeding: Secondary | ICD-10-CM | POA: Insufficient documentation

## 2021-05-11 HISTORY — DX: Diverticulitis of large intestine without perforation or abscess without bleeding: K57.32

## 2021-05-12 ENCOUNTER — Telehealth: Payer: Self-pay | Admitting: *Deleted

## 2021-05-12 NOTE — Telephone Encounter (Signed)
° °  Pre-operative Risk Assessment    Patient Name: [name]  DOB: 1945/05/10 MRN: [id number]      Request for Surgical Clearance    Procedure:   COLONOSCOPY  Date of Surgery:  Clearance TBD                                 Surgeon:  DR. Noberto Retort Surgeon's Group or Practice Name:  [facility] Phone number:  [phone] Fax number:  [phone]   Type of Clearance Requested:   - Medical  - Pharmacy:  Hold Clopidogrel (Plavix)     Type of Anesthesia:   PROPOFOL   Additional requests/questions:    Jiles Prows   05/12/2021, 3:54 PM

## 2021-05-13 NOTE — Telephone Encounter (Signed)
° ° °  Patient Name: [name]  DOB: 05-18-1945 MRN: [id number]  Primary Cardiologist: Lauree Chandler, MD  Chart reviewed as part of pre-operative protocol coverage.   The patient was seen by Dr. Angelena Form on 05/10/21. Per note, "CAD without angina: He is known to have multi-vessel CAD with prior coronary stenting. He has no chest pain suggestive of angina. Will continue statin and beta blocker.  I will stop Plavix and start ASA 81 mg daily" .  Pharmacy to review anticoagulation.  Dr. Angelena Form, does patient needs to hold ASA for colonoscopy?  Please forward your response to [facility] PREOP.   Thank you     Leanor Kail, PA 05/13/2021, 8:41 AM

## 2021-05-14 NOTE — Telephone Encounter (Signed)
Patient with diagnosis of afib on Xarelto for anticoagulation.    Procedure: COLONOSCOPY Date of procedure: TBD   CHA2DS2-VASc Score = 6   This indicates a 9.7% annual risk of stroke. The patient's score is based upon: CHF History: 1 HTN History: 1 Diabetes History: 1 Stroke History: 0 Vascular Disease History: 1 Age Score: 2 Gender Score: 0     CrCl 58.7 ml/min  Per office protocol, patient can hold Xarelto for 1 day prior to procedure.

## 2021-05-31 DIAGNOSIS — J0110 Acute frontal sinusitis, unspecified: Secondary | ICD-10-CM | POA: Diagnosis not present

## 2021-06-15 DIAGNOSIS — E119 Type 2 diabetes mellitus without complications: Secondary | ICD-10-CM | POA: Diagnosis not present

## 2021-06-15 DIAGNOSIS — H938X3 Other specified disorders of ear, bilateral: Secondary | ICD-10-CM | POA: Diagnosis not present

## 2021-06-15 DIAGNOSIS — I10 Essential (primary) hypertension: Secondary | ICD-10-CM | POA: Diagnosis not present

## 2021-06-17 DIAGNOSIS — F17210 Nicotine dependence, cigarettes, uncomplicated: Secondary | ICD-10-CM | POA: Diagnosis not present

## 2021-06-17 DIAGNOSIS — J449 Chronic obstructive pulmonary disease, unspecified: Secondary | ICD-10-CM | POA: Diagnosis not present

## 2021-06-17 DIAGNOSIS — K5732 Diverticulitis of large intestine without perforation or abscess without bleeding: Secondary | ICD-10-CM | POA: Diagnosis not present

## 2021-06-17 DIAGNOSIS — I129 Hypertensive chronic kidney disease with stage 1 through stage 4 chronic kidney disease, or unspecified chronic kidney disease: Secondary | ICD-10-CM | POA: Diagnosis not present

## 2021-06-17 DIAGNOSIS — E119 Type 2 diabetes mellitus without complications: Secondary | ICD-10-CM | POA: Diagnosis not present

## 2021-06-17 DIAGNOSIS — Z7901 Long term (current) use of anticoagulants: Secondary | ICD-10-CM | POA: Diagnosis not present

## 2021-06-17 DIAGNOSIS — Z7985 Long-term (current) use of injectable non-insulin antidiabetic drugs: Secondary | ICD-10-CM | POA: Diagnosis not present

## 2021-06-17 DIAGNOSIS — E1122 Type 2 diabetes mellitus with diabetic chronic kidney disease: Secondary | ICD-10-CM | POA: Diagnosis not present

## 2021-06-17 DIAGNOSIS — Z86010 Personal history of colonic polyps: Secondary | ICD-10-CM | POA: Diagnosis not present

## 2021-06-17 DIAGNOSIS — N182 Chronic kidney disease, stage 2 (mild): Secondary | ICD-10-CM | POA: Diagnosis not present

## 2021-06-17 DIAGNOSIS — K5730 Diverticulosis of large intestine without perforation or abscess without bleeding: Secondary | ICD-10-CM | POA: Diagnosis not present

## 2021-06-17 DIAGNOSIS — Z955 Presence of coronary angioplasty implant and graft: Secondary | ICD-10-CM | POA: Diagnosis not present

## 2021-06-17 DIAGNOSIS — Z7984 Long term (current) use of oral hypoglycemic drugs: Secondary | ICD-10-CM | POA: Diagnosis not present

## 2021-06-23 DIAGNOSIS — R972 Elevated prostate specific antigen [PSA]: Secondary | ICD-10-CM | POA: Diagnosis not present

## 2021-06-23 DIAGNOSIS — N401 Enlarged prostate with lower urinary tract symptoms: Secondary | ICD-10-CM | POA: Diagnosis not present

## 2021-06-23 DIAGNOSIS — C679 Malignant neoplasm of bladder, unspecified: Secondary | ICD-10-CM | POA: Diagnosis not present

## 2021-07-07 ENCOUNTER — Telehealth: Payer: Self-pay | Admitting: Cardiovascular Disease

## 2021-07-07 NOTE — Telephone Encounter (Signed)
RN wanted to speak with Dr. Camillia Herter nurse about pt's medications ?

## 2021-07-08 MED ORDER — CARVEDILOL 12.5 MG PO TABS: 12.5000 mg | ORAL_TABLET | Freq: Two times a day (BID) | ORAL | 3 refills | Status: DC

## 2021-07-08 MED ORDER — AMLODIPINE BESYLATE 5 MG PO TABS: 5.0000 mg | ORAL_TABLET | Freq: Every day | ORAL | 3 refills | Status: DC

## 2021-07-08 MED ORDER — LOSARTAN POTASSIUM 100 MG PO TABS: 100.0000 mg | ORAL_TABLET | Freq: Every day | ORAL | 3 refills | Status: DC

## 2021-07-08 MED ORDER — ROSUVASTATIN CALCIUM 20 MG PO TABS: 20.0000 mg | ORAL_TABLET | Freq: Every day | ORAL | 3 refills | Status: DC

## 2021-07-08 NOTE — Telephone Encounter (Signed)
Called back to [facility], Dr. Doran Durand office.  Spoke with Thurnell Lose, Therapist, sports.  She said the patient wanted to get a refill of his losartan to [facility] [address].  I reviewed his medications and sent refills for all cardiac medications to this location.  I left the pt VM informing him he can pick up losartan and any other needed meds at [facility]. ?

## 2021-07-26 ENCOUNTER — Other Ambulatory Visit: Payer: Self-pay | Admitting: Cardiovascular Disease

## 2021-07-26 NOTE — Telephone Encounter (Signed)
Xarelto 20 mg refill request received. Pt is 76 years old, weight- 74.3 kg, Crea- 1.01 on 03/16/21 via [facility] , last seen by Dr. Angelena Form on 05/10/21, Diagnosis- PAF, CrCl- 65.39; Dose is appropriate based on dosing criteria. Will send in refill to requested pharmacy.    ?

## 2021-08-02 DIAGNOSIS — R1032 Left lower quadrant pain: Secondary | ICD-10-CM | POA: Diagnosis not present

## 2021-08-06 DIAGNOSIS — M79605 Pain in left leg: Secondary | ICD-10-CM | POA: Diagnosis not present

## 2021-08-06 DIAGNOSIS — Z9889 Other specified postprocedural states: Secondary | ICD-10-CM | POA: Diagnosis not present

## 2021-08-06 DIAGNOSIS — M1612 Unilateral primary osteoarthritis, left hip: Secondary | ICD-10-CM | POA: Diagnosis not present

## 2021-08-06 DIAGNOSIS — R1032 Left lower quadrant pain: Secondary | ICD-10-CM | POA: Diagnosis not present

## 2021-08-06 DIAGNOSIS — I878 Other specified disorders of veins: Secondary | ICD-10-CM | POA: Diagnosis not present

## 2021-08-06 DIAGNOSIS — R1030 Lower abdominal pain, unspecified: Secondary | ICD-10-CM | POA: Diagnosis not present

## 2021-09-14 DIAGNOSIS — E782 Mixed hyperlipidemia: Secondary | ICD-10-CM | POA: Diagnosis not present

## 2021-09-14 DIAGNOSIS — E1159 Type 2 diabetes mellitus with other circulatory complications: Secondary | ICD-10-CM | POA: Diagnosis not present

## 2021-09-14 DIAGNOSIS — I10 Essential (primary) hypertension: Secondary | ICD-10-CM | POA: Diagnosis not present

## 2021-09-14 DIAGNOSIS — I739 Peripheral vascular disease, unspecified: Secondary | ICD-10-CM | POA: Diagnosis not present

## 2021-11-02 ENCOUNTER — Telehealth: Payer: Self-pay | Admitting: Cardiovascular Disease

## 2021-11-02 NOTE — Telephone Encounter (Signed)
Since Dr. Bettina Gavia is entering slow down he would prefer either Dr. Geraldo Pitter or Dr. Agustin Cree to accept this patient.

## 2021-11-02 NOTE — Telephone Encounter (Signed)
Patient would like to switch providers from Dr. Angelena Form to one of the Doctors at the [facility] office. He said the [facility] office is closer to him.  Please let the patient know which of the [facility] providers is going to see him

## 2021-11-03 DIAGNOSIS — Z7901 Long term (current) use of anticoagulants: Secondary | ICD-10-CM | POA: Diagnosis not present

## 2021-11-03 DIAGNOSIS — I4891 Unspecified atrial fibrillation: Secondary | ICD-10-CM | POA: Diagnosis not present

## 2021-11-08 NOTE — Telephone Encounter (Signed)
From: Burnell Blanks, MD  Sent: 11/07/2021   7:24 PM EDT  To: Stanton Kidney, RN; Rodman Key, RN    Winter with me. Gerald Stabs

## 2021-12-08 DIAGNOSIS — M461 Sacroiliitis, not elsewhere classified: Secondary | ICD-10-CM | POA: Diagnosis not present

## 2021-12-08 DIAGNOSIS — M9905 Segmental and somatic dysfunction of pelvic region: Secondary | ICD-10-CM | POA: Diagnosis not present

## 2021-12-08 DIAGNOSIS — M5136 Other intervertebral disc degeneration, lumbar region: Secondary | ICD-10-CM | POA: Diagnosis not present

## 2021-12-08 DIAGNOSIS — M9903 Segmental and somatic dysfunction of lumbar region: Secondary | ICD-10-CM | POA: Diagnosis not present

## 2021-12-16 DIAGNOSIS — E0859 Diabetes mellitus due to underlying condition with other circulatory complications: Secondary | ICD-10-CM | POA: Diagnosis not present

## 2021-12-16 DIAGNOSIS — I10 Essential (primary) hypertension: Secondary | ICD-10-CM | POA: Diagnosis not present

## 2021-12-16 DIAGNOSIS — E1159 Type 2 diabetes mellitus with other circulatory complications: Secondary | ICD-10-CM | POA: Diagnosis not present

## 2021-12-16 DIAGNOSIS — I4891 Unspecified atrial fibrillation: Secondary | ICD-10-CM | POA: Diagnosis not present

## 2021-12-21 DIAGNOSIS — M461 Sacroiliitis, not elsewhere classified: Secondary | ICD-10-CM | POA: Diagnosis not present

## 2021-12-21 DIAGNOSIS — M9903 Segmental and somatic dysfunction of lumbar region: Secondary | ICD-10-CM | POA: Diagnosis not present

## 2021-12-21 DIAGNOSIS — M9905 Segmental and somatic dysfunction of pelvic region: Secondary | ICD-10-CM | POA: Diagnosis not present

## 2021-12-21 DIAGNOSIS — M5136 Other intervertebral disc degeneration, lumbar region: Secondary | ICD-10-CM | POA: Diagnosis not present

## 2022-01-26 DIAGNOSIS — R490 Dysphonia: Secondary | ICD-10-CM | POA: Diagnosis not present

## 2022-01-26 DIAGNOSIS — R739 Hyperglycemia, unspecified: Secondary | ICD-10-CM | POA: Diagnosis not present

## 2022-02-09 ENCOUNTER — Other Ambulatory Visit: Payer: Self-pay

## 2022-02-09 DIAGNOSIS — I480 Paroxysmal atrial fibrillation: Secondary | ICD-10-CM | POA: Insufficient documentation

## 2022-02-09 DIAGNOSIS — C679 Malignant neoplasm of bladder, unspecified: Secondary | ICD-10-CM | POA: Insufficient documentation

## 2022-02-09 DIAGNOSIS — E1169 Type 2 diabetes mellitus with other specified complication: Secondary | ICD-10-CM | POA: Insufficient documentation

## 2022-02-09 DIAGNOSIS — I4729 Other ventricular tachycardia: Secondary | ICD-10-CM | POA: Insufficient documentation

## 2022-02-09 DIAGNOSIS — I493 Ventricular premature depolarization: Secondary | ICD-10-CM | POA: Insufficient documentation

## 2022-02-09 DIAGNOSIS — F17210 Nicotine dependence, cigarettes, uncomplicated: Secondary | ICD-10-CM | POA: Insufficient documentation

## 2022-02-09 DIAGNOSIS — E1165 Type 2 diabetes mellitus with hyperglycemia: Secondary | ICD-10-CM | POA: Insufficient documentation

## 2022-02-09 DIAGNOSIS — I255 Ischemic cardiomyopathy: Secondary | ICD-10-CM | POA: Insufficient documentation

## 2022-02-09 DIAGNOSIS — I2510 Atherosclerotic heart disease of native coronary artery without angina pectoris: Secondary | ICD-10-CM | POA: Insufficient documentation

## 2022-02-09 DIAGNOSIS — E119 Type 2 diabetes mellitus without complications: Secondary | ICD-10-CM | POA: Insufficient documentation

## 2022-02-09 DIAGNOSIS — I219 Acute myocardial infarction, unspecified: Secondary | ICD-10-CM | POA: Insufficient documentation

## 2022-02-09 DIAGNOSIS — M48061 Spinal stenosis, lumbar region without neurogenic claudication: Secondary | ICD-10-CM | POA: Insufficient documentation

## 2022-02-09 DIAGNOSIS — I5022 Chronic systolic (congestive) heart failure: Secondary | ICD-10-CM | POA: Insufficient documentation

## 2022-02-09 HISTORY — DX: Type 2 diabetes mellitus without complications: E11.9

## 2022-02-09 HISTORY — DX: Paroxysmal atrial fibrillation: I48.0

## 2022-02-10 ENCOUNTER — Encounter: Payer: Self-pay | Admitting: Cardiology

## 2022-02-10 ENCOUNTER — Ambulatory Visit: Payer: Medicare Other | Attending: Cardiology | Admitting: Cardiology

## 2022-02-10 VITALS — BP 126/74 | HR 77 | Ht 67.6 in | Wt 170.4 lb

## 2022-02-10 DIAGNOSIS — E785 Hyperlipidemia, unspecified: Secondary | ICD-10-CM

## 2022-02-10 DIAGNOSIS — I255 Ischemic cardiomyopathy: Secondary | ICD-10-CM

## 2022-02-10 DIAGNOSIS — M5416 Radiculopathy, lumbar region: Secondary | ICD-10-CM | POA: Diagnosis not present

## 2022-02-10 DIAGNOSIS — I480 Paroxysmal atrial fibrillation: Secondary | ICD-10-CM | POA: Diagnosis not present

## 2022-02-10 DIAGNOSIS — I25110 Atherosclerotic heart disease of native coronary artery with unstable angina pectoris: Secondary | ICD-10-CM | POA: Diagnosis not present

## 2022-02-10 DIAGNOSIS — I739 Peripheral vascular disease, unspecified: Secondary | ICD-10-CM

## 2022-02-10 DIAGNOSIS — I5022 Chronic systolic (congestive) heart failure: Secondary | ICD-10-CM

## 2022-02-10 DIAGNOSIS — F17210 Nicotine dependence, cigarettes, uncomplicated: Secondary | ICD-10-CM

## 2022-02-10 DIAGNOSIS — I779 Disorder of arteries and arterioles, unspecified: Secondary | ICD-10-CM

## 2022-02-10 NOTE — Addendum Note (Signed)
Addended by: Jacobo Forest D on: 02/10/2022 09:04 AM   Modules accepted: Orders

## 2022-02-10 NOTE — Patient Instructions (Signed)
Medication Instructions:  Your physician recommends that you continue on your current medications as directed. Please refer to the Current Medication list given to you today.  *If you need a refill on your cardiac medications before your next appointment, please call your pharmacy*   Lab Work: Your physician recommends that you return for lab work in: when you are fasting You need to have labs done when you are fasting.  You can come Monday through Friday 8:30 am to 12:00 pm and 1:15 to 4:30. You do not need to make an appointment as the order has already been placed. The labs you are going to have done are AST, ALT, Hgb A1C, Lipids.    Testing/Procedures: Your physician has requested that you have an echocardiogram. Echocardiography is a painless test that uses sound waves to create images of your heart. It provides your doctor with information about the size and shape of your heart and how well your heart's chambers and valves are working. This procedure takes approximately one hour. There are no restrictions for this procedure. Please do NOT wear cologne, perfume, aftershave, or lotions (deodorant is allowed). Please arrive 15 minutes prior to your appointment time.   Your physician has requested that you have a carotid duplex. This test is an ultrasound of the carotid arteries in your neck. It looks at blood flow through these arteries that supply the brain with blood. Allow one hour for this exam. There are no restrictions or special instructions.    Follow-Up: At [facility], you and your health needs are our priority.  As part of our continuing mission to provide you with exceptional heart care, we have created designated Provider Care Teams.  These Care Teams include your primary Cardiologist (physician) and Advanced Practice Providers (APPs -  Physician Assistants and Nurse Practitioners) who all work together to provide you with the care you need, when you need it.  We recommend  signing up for the patient portal called "MyChart".  Sign up information is provided on this After Visit Summary.  MyChart is used to connect with patients for Virtual Visits (Telemedicine).  Patients are able to view lab/test results, encounter notes, upcoming appointments, etc.  Non-urgent messages can be sent to your provider as well.   To learn more about what you can do with MyChart, go to [email or web address].    Your next appointment:   6 month(s)  The format for your next appointment:   In Person  Provider:   Jenne Campus, MD    Other Instructions NA

## 2022-02-10 NOTE — Progress Notes (Signed)
Cardiology Office Note:    Date:  02/10/2022   ID:  [name], DOB 1945/06/23, MRN [id number]  PCP:  Townsend Roger, MD  Cardiologist:  Jenne Campus, MD    Referring MD: Nona Dell, Corene Cornea, MD   No chief complaint on file. Doing fine  History of Present Illness:    [name] is a 76 y.o. male  with history of COPD, atrial fibrillation, CAD, ischemic cardiomyopathy, HTN, hyperlipidemia, ongoing tobacco abuse and PAD who is here today for follow up. His PV issues have been followed by Dr. Donnetta Hutching in [facility].  He has had bladder cancer and multiple surgeries on his bladder. Last carotid artery dopplers July 2021 with mild bilateral disease. He is known to have CAD with inferior MI in 1992, PTCA of RCA at that time. He had an anterior MI in 2000 and had a stent placed in the LAD. In 2006, he had an inferior MI with a Taxus stent placed in the RCA. In September 2006, repeat cath with occlusion of RCA in the stent. 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 Echo 2017 with stable mild LV systolic dysfunction, YTKZ=60-10% (previous 35-40%). Nuclear stress test September 2017 with no ischemia but LVEF of 23%. He was seen here in our office 03/04/16 by Truitt Merle, NP with c/o dizziness, cough, weakness and was found to be in atrial fibrillation with slow rate. Cardiac monitor Feb 2018 with NSVT. He was seen in EP clinic by Dr. Rayann Heman. Beta blocker changed to Coreg and Xarelto was started. Dizziness resolved when Lasix was stopped. He was seen in our office 09/25/17 by Cecilie Kicks, NP and c/o dyspnea and fatigue. He also noted lack of appetite and 20 lb weight loss. He also reported soreness in the muscles of his chest, not like prior cardiac pain. Echo 10/06/17 with LVEF of 30% with hypokinesis of the inferolateral, inferior and inferoseptal walls. No significant valve disease noted. Nuclear stress test was identical to stress test in 2017 showing large scar in the inferior and  apical walls with LVEF 29%. Of note, CT scan in 2017 in [facility] with evidence of emphysema but no masses or suspicious nodules. Cardiac cath July 2019 with a severe stenosis in the proximal LAD treated with a drug eluting stent. The RCA is chronically occluded and fills from left to right collaterals. Echo October 2019 with LVEF=25-30%.  He comes today to my office he would like to be established as a patient my practice.  He prefers [place] location.  Overall he is doing well he denies have any typical chest pain tightness squeezing pressure burning chest no TIA/CVA-like symptoms.  He is trying to walk and be active on the regular basis described to have some fatigue shortness of breath when he does not.  There is no swelling of lower extremities no paroxysmal nocturnal dyspnea, no dizziness no passing out  Past Medical History:  Diagnosis Date   Atherosclerosis of native arteries of the extremities with intermittent claudication 08/13/2013   Bladder cancer [facility])    resection x3   CAD, NATIVE VESSEL 09/22/2008   Qualifier: Diagnosis of  By: Jaramillo, [place], ISCHEMIC 09/22/2008   Qualifier: Diagnosis of  By: Sidney Ace     Chronic systolic CHF (congestive heart failure) ([place])    Cigarette smoker    Coronary artery disease    status post DMI RX Taxus stent RCA 2006 with susequent  Stent LAD and subsequent  stent thrombosis RCA unable to be opened 2006 -neg mv 10/2008, 10/16/17 ISR to pLAD with PTCA/DES, CTO of RCA with collaterals, EF 25%   Coronary artery disease involving native coronary artery of native heart with unstable angina pectoris ([place])    Diverticulitis of sigmoid colon 05/11/2021   HYPERLIPIDEMIA-MIXED 09/22/2008   Qualifier: Diagnosis of  By: Sidney Ace     HYPERTENSION, BENIGN 09/22/2008   Qualifier: Diagnosis of  By: Sidney Ace     Ischemic cardiomyopathy    ejection fraction of 40-45%   Lumbar spinal stenosis    Myocardial infarction ([place])    "I've  had 4" (10/16/2017)   NSVT (nonsustained ventricular tachycardia) (HCC)    Paroxysmal atrial fibrillation (HCC)    PVC's (premature ventricular contractions)    PVD 09/22/2008   Qualifier: Diagnosis of  By: Sidney Ace     Status post coronary artery stent placement    SYSTOLIC HEART FAILURE, CHRONIC 09/22/2008   Qualifier: Diagnosis of  By: Sidney Ace     TOBACCO ABUSE 05/29/2009   Qualifier: Diagnosis of  By: Earley Favor, RN, BSN, Patricia L    Type II diabetes mellitus ([place])    Unstable angina ([place]) 10/16/2017    Past Surgical History:  Procedure Laterality Date   AORTA - BILATERAL FEMORAL ARTERY BYPASS GRAFT Bilateral 1992   APPENDECTOMY     BACK SURGERY     CORONARY ANGIOPLASTY WITH STENT PLACEMENT     taxus stent  placed into his right coronary artery; stent placed to the LAD.     CORONARY STENT INTERVENTION N/A 10/16/2017   Procedure: CORONARY STENT INTERVENTION;  Surgeon: Burnell Blanks, MD;  Location: [facility];  Service: Cardiovascular;  Laterality: N/A;   FEMORAL-FEMORAL BYPASS GRAFT  07/2009   LUMBAR MICRODISCECTOMY  12/31/08   RIGHT/LEFT HEART CATH AND CORONARY ANGIOGRAPHY N/A 10/16/2017   Procedure: RIGHT/LEFT HEART CATH AND CORONARY ANGIOGRAPHY;  Surgeon: Burnell Blanks, MD;  Location: [facility];  Service: Cardiovascular;  Laterality: N/A;   SHOULDER OPEN ROTATOR CUFF REPAIR Left     Current Medications: Current Meds  Medication Sig   amLODipine (NORVASC) 5 MG tablet Take 1 tablet (5 mg total) by mouth daily.   aspirin EC 81 MG tablet Take 1 tablet (81 mg total) by mouth daily. Swallow whole.   budesonide-formoterol (SYMBICORT) 160-4.5 MCG/ACT inhaler Inhale 2 puffs into the lungs 2 (two) times daily as needed for wheezing or shortness of breath.   carvedilol (COREG) 25 MG tablet Take 25 mg by mouth 2 (two) times daily with a meal.   cetirizine (ZYRTEC) 10 MG tablet Take 10 mg by mouth daily.   empagliflozin (JARDIANCE) 10 MG TABS tablet  Take 10 mg by mouth daily.   gabapentin (NEURONTIN) 100 MG capsule Take 100 mg by mouth 2 (two) times daily.   glipiZIDE (GLUCOTROL) 10 MG tablet Take 10 mg by mouth 2 (two) times daily.   rosuvastatin (CRESTOR) 20 MG tablet Take 1 tablet (20 mg total) by mouth daily.   XARELTO 20 MG TABS tablet TAKE 1 TABLET BY MOUTH DAILY WITH SUPPER (Patient taking differently: Take 20 mg by mouth every other day.)     Allergies:   Lisinopril   Social History   Socioeconomic History   Marital status: Married    Spouse name: Not on file   Number of children: 1   Years of education: Not on file   Highest education level: Not on  file  Occupational History   Occupation: retired    Fish farm manager: RETIRED    Comment: truck driver  Tobacco Use   Smoking status: Every Day    Packs/day: 0.50    Years: 56.00    Total pack years: 28.00    Types: Cigars, Cigarettes   Smokeless tobacco: Former    Types: Nurse, children's Use: Never used  Substance and Sexual Activity   Alcohol use: Yes    Comment: 10/16/2017 "1 beer q 2-3 months"   Drug use: Never   Sexual activity: Not Currently  Other Topics Concern   Not on file  Social History Narrative   Lives with girlfriend in [place].     Social Determinants of Health   Financial Resource Strain: Not on file  Food Insecurity: Not on file  Transportation Needs: Not on file  Physical Activity: Not on file  Stress: Not on file  Social Connections: Not on file     Family History: The patient's family history includes Cardiomyopathy in his mother; Coronary artery disease in his father and unknown relative; Diabetes in his father, sister, and unknown relative; Heart attack in his father and sister; Heart disease in his father, mother, and sister; Hyperlipidemia in his father, mother, and sister; Stroke in his father. ROS:   Please see the history of present illness.    All 14 point review of systems negative except as described per history of present  illness  EKGs/Labs/Other Studies Reviewed:      Recent Labs: No results found for requested labs within last 365 days.  Recent Lipid Panel    Component Value Date/Time   CHOL 129 02/09/2018 0721   TRIG 77 02/09/2018 0721   HDL 39 (L) 02/09/2018 0721   CHOLHDL 3.3 02/09/2018 0721   CHOLHDL 6.1 CALC 11/07/2006 0958   VLDL 20 11/07/2006 0958   LDLCALC 75 02/09/2018 0721    Physical Exam:    VS:  BP 126/74   Pulse 77   Ht 5' 7.6" (1.717 m)   Wt 170 lb 6.4 oz (77.3 kg)   SpO2 95%   BMI 26.22 kg/m     Wt Readings from Last 3 Encounters:  02/10/22 170 lb 6.4 oz (77.3 kg)  05/10/21 163 lb 12.8 oz (74.3 kg)  03/30/20 154 lb 12.8 oz (70.2 kg)     GEN:  Well nourished, well developed in no acute distress HEENT: Normal NECK: No JVD; No carotid bruits LYMPHATICS: No lymphadenopathy CARDIAC: RRR, no murmurs, no rubs, no gallops RESPIRATORY:  Clear to auscultation without rales, wheezing or rhonchi  ABDOMEN: Soft, non-tender, non-distended MUSCULOSKELETAL:  No edema; No deformity  SKIN: Warm and dry LOWER EXTREMITIES: no swelling NEUROLOGIC:  Alert and oriented x 3 PSYCHIATRIC:  Normal affect   ASSESSMENT:    1. Coronary artery disease involving native coronary artery of native heart with unstable angina pectoris ([place])   2. Chronic systolic CHF (congestive heart failure) ([place])   3. Ischemic cardiomyopathy   4. Paroxysmal atrial fibrillation (HCC)   5. PVD   6. Cigarette smoker    PLAN:    In order of problems listed above:  Ischemic cardiomyopathy I will ask him to have an echocardiogram performed to recheck left ventricle ejection fraction in the meantime we will continue present medications in the future anticipate need to upgrade his therapy to [facility]. Coronary artery disease stable from that point review denies having any symptoms.  He is on Xarelto for stroke prevention  because of atrial fibrillation as well as aspirin.  Likely he does not have any complication  of this approach.  I understand that he is on this therapy secondary to the fact that he does have quite aggressive coronary artery disease with multiple interventions. Congestive heart failure: Compensated on the physical exam Paroxysmal atrial fibrillation EKG today show what appears to be sinus rhythm.  He is anticoagulated which I will continue. PVD: I will ask him to have carotic ultrasound done because of previous problem. Again, he quit a year ago and I congratulated him for it and encouraged him to stay away from smoking.   Medication Adjustments/Labs and Tests Ordered: Current medicines are reviewed at length with the patient today.  Concerns regarding medicines are outlined above.  No orders of the defined types were placed in this encounter.  Medication changes: No orders of the defined types were placed in this encounter.   Signed, Park Liter, MD, [facility] 02/10/2022 8:54 AM    [place]

## 2022-02-18 DIAGNOSIS — M62552 Muscle wasting and atrophy, not elsewhere classified, left thigh: Secondary | ICD-10-CM | POA: Diagnosis not present

## 2022-02-18 DIAGNOSIS — M79605 Pain in left leg: Secondary | ICD-10-CM | POA: Diagnosis not present

## 2022-02-18 DIAGNOSIS — R2689 Other abnormalities of gait and mobility: Secondary | ICD-10-CM | POA: Diagnosis not present

## 2022-02-18 DIAGNOSIS — M5416 Radiculopathy, lumbar region: Secondary | ICD-10-CM | POA: Diagnosis not present

## 2022-02-25 DIAGNOSIS — M62552 Muscle wasting and atrophy, not elsewhere classified, left thigh: Secondary | ICD-10-CM | POA: Diagnosis not present

## 2022-02-25 DIAGNOSIS — M79605 Pain in left leg: Secondary | ICD-10-CM | POA: Diagnosis not present

## 2022-02-25 DIAGNOSIS — M5416 Radiculopathy, lumbar region: Secondary | ICD-10-CM | POA: Diagnosis not present

## 2022-02-25 DIAGNOSIS — R2689 Other abnormalities of gait and mobility: Secondary | ICD-10-CM | POA: Diagnosis not present

## 2022-02-28 ENCOUNTER — Ambulatory Visit (INDEPENDENT_AMBULATORY_CARE_PROVIDER_SITE_OTHER): Payer: Medicare Other

## 2022-02-28 ENCOUNTER — Ambulatory Visit: Payer: Medicare Other | Attending: Cardiology

## 2022-02-28 DIAGNOSIS — E785 Hyperlipidemia, unspecified: Secondary | ICD-10-CM | POA: Diagnosis not present

## 2022-02-28 DIAGNOSIS — I779 Disorder of arteries and arterioles, unspecified: Secondary | ICD-10-CM | POA: Diagnosis not present

## 2022-02-28 DIAGNOSIS — M62552 Muscle wasting and atrophy, not elsewhere classified, left thigh: Secondary | ICD-10-CM | POA: Diagnosis not present

## 2022-02-28 DIAGNOSIS — I255 Ischemic cardiomyopathy: Secondary | ICD-10-CM

## 2022-02-28 DIAGNOSIS — I739 Peripheral vascular disease, unspecified: Secondary | ICD-10-CM | POA: Diagnosis not present

## 2022-02-28 DIAGNOSIS — M79605 Pain in left leg: Secondary | ICD-10-CM | POA: Diagnosis not present

## 2022-02-28 DIAGNOSIS — I5022 Chronic systolic (congestive) heart failure: Secondary | ICD-10-CM | POA: Diagnosis not present

## 2022-02-28 DIAGNOSIS — R0989 Other specified symptoms and signs involving the circulatory and respiratory systems: Secondary | ICD-10-CM

## 2022-02-28 DIAGNOSIS — I25110 Atherosclerotic heart disease of native coronary artery with unstable angina pectoris: Secondary | ICD-10-CM | POA: Diagnosis not present

## 2022-02-28 DIAGNOSIS — M5416 Radiculopathy, lumbar region: Secondary | ICD-10-CM | POA: Diagnosis not present

## 2022-02-28 DIAGNOSIS — R2689 Other abnormalities of gait and mobility: Secondary | ICD-10-CM | POA: Diagnosis not present

## 2022-02-28 LAB — ECHOCARDIOGRAM COMPLETE
Area-P 1/2: 4.46 cm2
Calc EF: 25.9 %
S' Lateral: 5.2 cm
Single Plane A2C EF: 30.2 %
Single Plane A4C EF: 27 %

## 2022-03-01 ENCOUNTER — Telehealth: Payer: Self-pay

## 2022-03-01 LAB — LIPID PANEL
Chol/HDL Ratio: 3.5 ratio (ref 0.0–5.0)
Cholesterol, Total: 122 mg/dL (ref 100–199)
HDL: 35 mg/dL — ABNORMAL LOW (ref >39)
LDL Chol Calc (NIH): 66 mg/dL (ref 0–99)
Triglycerides: 111 mg/dL (ref 0–149)
VLDL Cholesterol Cal: 21 mg/dL (ref 5–40)

## 2022-03-01 LAB — HEMOGLOBIN A1C
Est. average glucose Bld gHb Est-mCnc: 197 mg/dL
Hgb A1c MFr Bld: 8.5 % — ABNORMAL HIGH (ref 4.8–5.6)

## 2022-03-01 LAB — ALT: ALT: 9 IU/L (ref 0–44)

## 2022-03-01 LAB — AST: AST: 10 IU/L (ref 0–40)

## 2022-03-01 NOTE — Telephone Encounter (Signed)
Results reviewed with pt as per Dr. Krasowski's note.  Pt verbalized understanding and had no additional questions. Routed to PCP  

## 2022-03-02 ENCOUNTER — Encounter: Payer: Self-pay | Admitting: Internal Medicine

## 2022-03-02 ENCOUNTER — Ambulatory Visit (INDEPENDENT_AMBULATORY_CARE_PROVIDER_SITE_OTHER): Payer: Medicare Other | Admitting: Internal Medicine

## 2022-03-02 VITALS — BP 124/68 | HR 71 | Temp 98.4°F | Resp 18 | Ht 67.0 in | Wt 172.2 lb

## 2022-03-02 DIAGNOSIS — I25118 Atherosclerotic heart disease of native coronary artery with other forms of angina pectoris: Secondary | ICD-10-CM | POA: Diagnosis not present

## 2022-03-02 DIAGNOSIS — E1169 Type 2 diabetes mellitus with other specified complication: Secondary | ICD-10-CM | POA: Diagnosis not present

## 2022-03-02 DIAGNOSIS — E785 Hyperlipidemia, unspecified: Secondary | ICD-10-CM

## 2022-03-02 DIAGNOSIS — I480 Paroxysmal atrial fibrillation: Secondary | ICD-10-CM

## 2022-03-02 MED ORDER — SYNJARDY 12.5-1000 MG PO TABS: 1.0000 | ORAL_TABLET | Freq: Two times a day (BID) | ORAL | 6 refills | Status: DC

## 2022-03-02 MED ORDER — GLIPIZIDE 10 MG PO TABS: 10.0000 mg | ORAL_TABLET | Freq: Two times a day (BID) | ORAL | 6 refills | Status: DC

## 2022-03-02 NOTE — Patient Instructions (Addendum)
BP is target control so c/w current medications LDL is target control, continue with current medications along with diet and exercise Blood sugar is not controlled, need to watch diet, exercise and take medications as prescribed.   He will see eye doctor for diabetic eye examination.

## 2022-03-02 NOTE — Progress Notes (Addendum)
Established Patient Office Visit  Subjective   Patient ID: [name], male    DOB: 06/16/1945  Age: 76 y.o. MRN: [id number]  Chief Complaint  Patient presents with   Follow-up   Diabetes   Hyperlipidemia   Hypertension    76 years old male with history of CAD, cardiomyopathy and PAF was seen by cardiologist for follow up, They suggested that his HbA1c need to be controlled, otherwise his numbers has been high.s  Diabetes He presents for his follow-up diabetic visit. He has type 2 diabetes mellitus. No MedicAlert identification noted. The initial diagnosis of diabetes was made 10 years ago. His disease course has been worsening. There are no hypoglycemic associated symptoms. Associated symptoms include blurred vision, fatigue, polyuria and weakness. Pertinent negatives for diabetes include no chest pain. Symptoms are worsening. Diabetic complications include heart disease. Risk factors for coronary artery disease include dyslipidemia, diabetes mellitus and hypertension. Current diabetic treatment includes diet. He is compliant with treatment some of the time. He is following a diabetic diet. He rarely participates in exercise. An ACE inhibitor/angiotensin II receptor blocker is being taken. He does not see a podiatrist.Eye exam is not current.  Hyperlipidemia This is a chronic problem. The current episode started more than 1 year ago. The problem is controlled. Recent lipid tests were reviewed and are normal. Exacerbating diseases include diabetes. Pertinent negatives include no chest pain or shortness of breath. Current antihyperlipidemic treatment includes statins.  Hypertension The current episode started more than 1 year ago. The problem is controlled. Associated symptoms include blurred vision. Pertinent negatives include no chest pain or shortness of breath. Risk factors for coronary artery disease include sedentary lifestyle. Past treatments include angiotensin blockers and beta  blockers.      Review of Systems  Constitutional:  Positive for fatigue.  Eyes:  Positive for blurred vision.  Respiratory:  Negative for shortness of breath.   Cardiovascular:  Negative for chest pain.  Neurological:  Positive for weakness.      Objective:     BP 124/68 (BP Location: Left Arm, Patient Position: Sitting, Cuff Size: Normal)   Pulse 71   Temp 98.4 F (36.9 C)   Resp 18   Ht '5\' 7"'$  (1.702 m)   Wt 172 lb 4 oz (78.1 kg)   SpO2 96%   BMI 26.98 kg/m    Physical Exam Constitutional:      Appearance: Normal appearance.  HENT:     Head: Normocephalic and atraumatic.  Eyes:     Extraocular Movements: Extraocular movements intact.     Pupils: Pupils are equal, round, and reactive to light.  Cardiovascular:     Rate and Rhythm: Normal rate and regular rhythm.  Pulmonary:     Effort: Pulmonary effort is normal.     Breath sounds: Normal breath sounds.  Abdominal:     General: Abdomen is flat.     Palpations: Abdomen is soft.  Musculoskeletal:        General: Normal range of motion.     Cervical back: Normal range of motion and neck supple.  Neurological:     General: No focal deficit present.     Mental Status: He is alert.  Psychiatric:        Mood and Affect: Mood normal.      No results found for any visits on 03/02/22.    The ASCVD Risk score (Arnett DK, et al., 2019) failed to calculate for the following reasons:   The patient  has a prior MI or stroke diagnosis    Assessment & Plan:   Problem List Items Addressed This Visit       Cardiovascular and Mediastinum   Coronary artery disease   Relevant Medications   losartan (COZAAR) 100 MG tablet   rivaroxaban (XARELTO) 20 MG TABS tablet   Paroxysmal atrial fibrillation (HCC) - Primary   Relevant Medications   losartan (COZAAR) 100 MG tablet   rivaroxaban (XARELTO) 20 MG TABS tablet     Endocrine   Dyslipidemia associated with type 2 diabetes mellitus (HCC)   Relevant Medications    losartan (COZAAR) 100 MG tablet   glipiZIDE (GLUCOTROL) 10 MG tablet   Empagliflozin-metFORMIN HCl (SYNJARDY) 12.08-998 MG TABS    Return in about 1 month (around 04/01/2022).    Garwin Brothers, MD

## 2022-03-04 ENCOUNTER — Telehealth: Payer: Self-pay

## 2022-03-04 DIAGNOSIS — R2689 Other abnormalities of gait and mobility: Secondary | ICD-10-CM | POA: Diagnosis not present

## 2022-03-04 DIAGNOSIS — M5416 Radiculopathy, lumbar region: Secondary | ICD-10-CM | POA: Diagnosis not present

## 2022-03-04 DIAGNOSIS — M79605 Pain in left leg: Secondary | ICD-10-CM | POA: Diagnosis not present

## 2022-03-04 DIAGNOSIS — M62552 Muscle wasting and atrophy, not elsewhere classified, left thigh: Secondary | ICD-10-CM | POA: Diagnosis not present

## 2022-03-04 NOTE — Telephone Encounter (Signed)
Results reviewed with pt as per Dr. Krasowski's note.  Pt verbalized understanding and had no additional questions. Routed to PCP  

## 2022-03-08 ENCOUNTER — Telehealth: Payer: Self-pay

## 2022-03-08 DIAGNOSIS — I5022 Chronic systolic (congestive) heart failure: Secondary | ICD-10-CM

## 2022-03-08 MED ORDER — ENTRESTO 24-26 MG PO TABS: 1.0000 | ORAL_TABLET | Freq: Two times a day (BID) | ORAL | 3 refills | Status: DC

## 2022-03-08 NOTE — Telephone Encounter (Signed)
Spoke with pt about Echo. Advised per Dr. Wendy Poet note to stop Losartan and to start [facility] 24-26 1 twice daily. Samples provided. Rx sent to mail in pharmacy. Agreed to have BMP in 1 week.

## 2022-03-09 DIAGNOSIS — M5416 Radiculopathy, lumbar region: Secondary | ICD-10-CM | POA: Diagnosis not present

## 2022-03-09 DIAGNOSIS — M62552 Muscle wasting and atrophy, not elsewhere classified, left thigh: Secondary | ICD-10-CM | POA: Diagnosis not present

## 2022-03-09 DIAGNOSIS — M79605 Pain in left leg: Secondary | ICD-10-CM | POA: Diagnosis not present

## 2022-03-09 DIAGNOSIS — R2689 Other abnormalities of gait and mobility: Secondary | ICD-10-CM | POA: Diagnosis not present

## 2022-03-14 DIAGNOSIS — M5416 Radiculopathy, lumbar region: Secondary | ICD-10-CM | POA: Diagnosis not present

## 2022-03-14 DIAGNOSIS — M79605 Pain in left leg: Secondary | ICD-10-CM | POA: Diagnosis not present

## 2022-03-14 DIAGNOSIS — R2689 Other abnormalities of gait and mobility: Secondary | ICD-10-CM | POA: Diagnosis not present

## 2022-03-14 DIAGNOSIS — M62552 Muscle wasting and atrophy, not elsewhere classified, left thigh: Secondary | ICD-10-CM | POA: Diagnosis not present

## 2022-03-18 DIAGNOSIS — M79605 Pain in left leg: Secondary | ICD-10-CM | POA: Diagnosis not present

## 2022-03-18 DIAGNOSIS — M5416 Radiculopathy, lumbar region: Secondary | ICD-10-CM | POA: Diagnosis not present

## 2022-03-18 DIAGNOSIS — M62552 Muscle wasting and atrophy, not elsewhere classified, left thigh: Secondary | ICD-10-CM | POA: Diagnosis not present

## 2022-03-18 DIAGNOSIS — R2689 Other abnormalities of gait and mobility: Secondary | ICD-10-CM | POA: Diagnosis not present

## 2022-03-22 DIAGNOSIS — M62552 Muscle wasting and atrophy, not elsewhere classified, left thigh: Secondary | ICD-10-CM | POA: Diagnosis not present

## 2022-03-22 DIAGNOSIS — M79605 Pain in left leg: Secondary | ICD-10-CM | POA: Diagnosis not present

## 2022-03-22 DIAGNOSIS — M5416 Radiculopathy, lumbar region: Secondary | ICD-10-CM | POA: Diagnosis not present

## 2022-03-22 DIAGNOSIS — R2689 Other abnormalities of gait and mobility: Secondary | ICD-10-CM | POA: Diagnosis not present

## 2022-03-24 DIAGNOSIS — M5416 Radiculopathy, lumbar region: Secondary | ICD-10-CM | POA: Diagnosis not present

## 2022-03-24 DIAGNOSIS — Z6826 Body mass index (BMI) 26.0-26.9, adult: Secondary | ICD-10-CM | POA: Diagnosis not present

## 2022-03-28 ENCOUNTER — Encounter: Payer: Self-pay | Admitting: Internal Medicine

## 2022-03-28 ENCOUNTER — Ambulatory Visit: Payer: Medicare Other | Admitting: Internal Medicine

## 2022-03-28 VITALS — BP 120/70 | HR 65 | Temp 98.7°F | Resp 18 | Ht 67.0 in | Wt 167.4 lb

## 2022-03-28 DIAGNOSIS — I10 Essential (primary) hypertension: Secondary | ICD-10-CM | POA: Diagnosis not present

## 2022-03-28 DIAGNOSIS — Z0000 Encounter for general adult medical examination without abnormal findings: Secondary | ICD-10-CM

## 2022-03-28 DIAGNOSIS — Z23 Encounter for immunization: Secondary | ICD-10-CM

## 2022-03-28 DIAGNOSIS — E785 Hyperlipidemia, unspecified: Secondary | ICD-10-CM

## 2022-03-28 DIAGNOSIS — Z6826 Body mass index (BMI) 26.0-26.9, adult: Secondary | ICD-10-CM

## 2022-03-28 DIAGNOSIS — G8929 Other chronic pain: Secondary | ICD-10-CM

## 2022-03-28 DIAGNOSIS — I480 Paroxysmal atrial fibrillation: Secondary | ICD-10-CM

## 2022-03-28 DIAGNOSIS — E1169 Type 2 diabetes mellitus with other specified complication: Secondary | ICD-10-CM

## 2022-03-28 DIAGNOSIS — M5442 Lumbago with sciatica, left side: Secondary | ICD-10-CM

## 2022-03-28 DIAGNOSIS — I255 Ischemic cardiomyopathy: Secondary | ICD-10-CM

## 2022-03-28 DIAGNOSIS — R5383 Other fatigue: Secondary | ICD-10-CM | POA: Diagnosis not present

## 2022-03-28 DIAGNOSIS — F321 Major depressive disorder, single episode, moderate: Secondary | ICD-10-CM

## 2022-03-28 HISTORY — DX: Major depressive disorder, single episode, moderate: F32.1

## 2022-03-28 HISTORY — DX: Other fatigue: R53.83

## 2022-03-28 HISTORY — DX: Encounter for immunization: Z23

## 2022-03-28 MED ORDER — EMPAGLIFLOZIN 25 MG PO TABS: 25.0000 mg | ORAL_TABLET | Freq: Every day | ORAL | 3 refills | Status: DC

## 2022-03-28 MED ORDER — SERTRALINE HCL 50 MG PO TABS: 50.0000 mg | ORAL_TABLET | Freq: Every day | ORAL | 2 refills | Status: DC

## 2022-03-28 MED ORDER — GLIPIZIDE 10 MG PO TABS: 10.0000 mg | ORAL_TABLET | Freq: Two times a day (BID) | ORAL | 6 refills | Status: DC

## 2022-03-28 NOTE — Progress Notes (Signed)
Annual Wellness Visit     Patient: [name], Male    DOB: 1945/07/25, 76 y.o.   MRN: [id number]  Subjective  No chief complaint on file.   [name] is a 76 y.o. male who presents today for his Annual Wellness Visit.   HPI Exercise is limited by back pain and shortness of breath, he also feel he has no energy. He says he feel tired and feel like he has no energy, he also wake up every hour to urinate, he follows with Dr. Nila Nephew. He also has colonoscopy. He need flu shot today. He has shingle vaccine and pneumonia vaccine. He did not have COVID vaccine.  He has ischemic cardiomyopathy with chronic systolic heart failure and his cardiologist has started him on Entresto twice a day.  He does get short of breath when you walk but his back pain is also a big problem.  His lower back pain radiating to his left leg and he was seen by Dr. Ronnald Ramp for that. He also has type 2 diabetes mellitus and his blood sugar is not controlled.  His hemoglobin A1c last month was 8.1.  He could not take Synjardy or metformin because of stomach pain.  He also could not afford SGLT2 because of cost. He has provisional atrial fibrillation and his rate is controlled.  He is on Xarelto for anticoagulation.  He denies any bleeding. He is also depressed.  He denies having suicidal ideation or thoughts.  His score 14 on PHQ 2 9 test.  His daughter is present during this visit.     Medications: Outpatient Medications Prior to Visit  Medication Sig   amLODipine (NORVASC) 5 MG tablet Take 1 tablet (5 mg total) by mouth daily.   aspirin EC 81 MG tablet Take 1 tablet (81 mg total) by mouth daily. Swallow whole.   budesonide-formoterol (SYMBICORT) 160-4.5 MCG/ACT inhaler Inhale 2 puffs into the lungs 2 (two) times daily as needed for wheezing or shortness of breath.   carvedilol (COREG) 25 MG tablet Take 25 mg by mouth 2 (two) times daily with a meal.   cetirizine (ZYRTEC) 10 MG tablet Take 10 mg by mouth daily.    cetirizine (ZYRTEC) 10 MG tablet Take by mouth.   clopidogrel (PLAVIX) 75 MG tablet Take by mouth.   gabapentin (NEURONTIN) 100 MG capsule Take 100 mg by mouth 2 (two) times daily.   glipiZIDE (GLUCOTROL) 10 MG tablet Take 1 tablet (10 mg total) by mouth 2 (two) times daily.   rivaroxaban (XARELTO) 20 MG TABS tablet Take by mouth.   rosuvastatin (CRESTOR) 20 MG tablet Take 1 tablet (20 mg total) by mouth daily.   sacubitril-valsartan (ENTRESTO) 24-26 MG Take 1 tablet by mouth 2 (two) times daily.   XARELTO 20 MG TABS tablet TAKE 1 TABLET BY MOUTH DAILY WITH SUPPER (Patient taking differently: Take 20 mg by mouth every other day.)   [DISCONTINUED] Empagliflozin-metFORMIN HCl (SYNJARDY) 12.08-998 MG TABS Take 1 tablet by mouth 2 (two) times daily.   No facility-administered medications prior to visit.    Allergies  Allergen Reactions   Lisinopril Swelling and Rash    Rash - face and tounge swell    Patient Care Team: Garwin Brothers, MD as PCP - General (Internal Medicine) Burnell Blanks, MD as PCP - Cardiology (Cardiology)  Review of Systems  Constitutional:  Positive for malaise/fatigue.  Respiratory:  Positive for shortness of breath.   Cardiovascular:  Negative for chest pain and leg swelling.  Gastrointestinal: Negative.   Genitourinary:  Positive for frequency and urgency.  Musculoskeletal:  Positive for back pain.  Neurological: Negative.   Psychiatric/Behavioral:  Positive for depression. Negative for hallucinations and suicidal ideas.         Objective  BP 120/70 (BP Location: Left Arm, Patient Position: Sitting, Cuff Size: Normal)   Pulse 65   Temp 98.7 F (37.1 C)   Resp 18   Ht 5' 7" (1.702 m)   Wt 167 lb 6 oz (75.9 kg)   SpO2 97%   BMI 26.21 kg/m    Physical Exam Constitutional:      Appearance: He is normal weight.  HENT:     Head: Normocephalic and atraumatic.  Eyes:     Extraocular Movements: Extraocular movements intact.     Pupils: Pupils  are equal, round, and reactive to light.  Cardiovascular:     Rate and Rhythm: Normal rate and regular rhythm.     Heart sounds: Normal heart sounds.  Pulmonary:     Effort: Pulmonary effort is normal.     Breath sounds: Normal breath sounds.  Abdominal:     General: Bowel sounds are normal.     Palpations: Abdomen is soft.  Neurological:     General: No focal deficit present.     Mental Status: He is alert and oriented to person, place, and time.       Most recent functional status assessment:     No data to display         Most recent fall risk assessment:     No data to display          Most recent depression screenings:    03/28/2022    9:37 AM  PHQ 2/9 Scores  PHQ - 2 Score 4  PHQ- 9 Score 14   Most recent cognitive screening:     No data to display         Most recent Audit-C alcohol use screening     No data to display         A score of 3 or more in women, and 4 or more in men indicates increased risk for alcohol abuse, EXCEPT if all of the points are from question 1   Vision/Hearing Screen: No results found.   No results found for any visits on 03/28/22.    Assessment & Plan   Annual wellness visit done today including the all of the following: Reviewed patient's Family Medical History Reviewed and updated list of patient's medical providers Assessment of cognitive impairment was done Assessed patient's functional ability Established a written schedule for health screening [place] Completed and Reviewed  Exercise Activities and Dietary recommendations  Goals   None      There is no immunization history on file for this patient.  Health Maintenance  Topic Date Due   COVID-19 Vaccine (1) Never done   Pneumonia Vaccine 13+ Years old (1 - PCV) Never done   FOOT EXAM  Never done   OPHTHALMOLOGY EXAM  Never done   Diabetic kidney evaluation - Urine ACR  Never done   Hepatitis C Screening  Never done    DTaP/Tdap/Td (1 - Tdap) Never done   Zoster Vaccines- Shingrix (1 of 2) Never done   Lung Cancer Screening  03/08/2017   Diabetic kidney evaluation - eGFR measurement  02/10/2019   Medicare Annual Wellness (AWV)  06/06/2019   INFLUENZA VACCINE  11/16/2021   HEMOGLOBIN A1C  08/29/2022   HPV VACCINES  Aged Out     Discussed health benefits of physical activity, and encouraged him to engage in regular exercise appropriate for his age and condition.    Problem List Items Addressed This Visit       Cardiovascular and Mediastinum   HYPERTENSION, BENIGN   Relevant Orders   CMP14 + Anion Gap   Ischemic cardiomyopathy   Paroxysmal atrial fibrillation (HCC)     Endocrine   Dyslipidemia associated with type 2 diabetes mellitus (HCC)   Relevant Medications   glipiZIDE (GLUCOTROL) 10 MG tablet   empagliflozin (JARDIANCE) 25 MG TABS tablet   Other Relevant Orders   CMP14 + Anion Gap     Other   Backache   Need for prophylactic vaccination and inoculation against influenza - Primary   Relevant Orders   Flu Vaccine MDCK QUAD PF   Other fatigue   Relevant Orders   CBC with Differential/Platelet   TSH   Current moderate episode of major depressive disorder without prior episode (HCC)   Relevant Medications   sertraline (ZOLOFT) 50 MG tablet    Return in about 1 month (around 04/28/2022).     Garwin Brothers, MD

## 2022-03-29 LAB — CMP14 + ANION GAP
ALT: 9 IU/L (ref 0–44)
AST: 10 IU/L (ref 0–40)
Albumin/Globulin Ratio: 1 — ABNORMAL LOW (ref 1.2–2.2)
Albumin: 3.2 g/dL — ABNORMAL LOW (ref 3.8–4.8)
Alkaline Phosphatase: 93 IU/L (ref 44–121)
Anion Gap: 15 mmol/L (ref 10.0–18.0)
BUN/Creatinine Ratio: 9 — ABNORMAL LOW (ref 10–24)
BUN: 11 mg/dL (ref 8–27)
Bilirubin Total: 0.5 mg/dL (ref 0.0–1.2)
CO2: 22 mmol/L (ref 20–29)
Calcium: 9.1 mg/dL (ref 8.6–10.2)
Chloride: 101 mmol/L (ref 96–106)
Creatinine, Ser: 1.16 mg/dL (ref 0.76–1.27)
Globulin, Total: 3.2 g/dL (ref 1.5–4.5)
Glucose: 310 mg/dL — ABNORMAL HIGH (ref 70–99)
Potassium: 4 mmol/L (ref 3.5–5.2)
Sodium: 138 mmol/L (ref 134–144)
Total Protein: 6.4 g/dL (ref 6.0–8.5)
eGFR: 65 mL/min/{1.73_m2} (ref >59)

## 2022-03-29 LAB — CBC WITH DIFFERENTIAL/PLATELET
Basophils Absolute: 0 10*3/uL (ref 0.0–0.2)
Basos: 0 %
EOS (ABSOLUTE): 0.1 10*3/uL (ref 0.0–0.4)
Eos: 1 %
Hematocrit: 38.5 % (ref 37.5–51.0)
Hemoglobin: 12.5 g/dL — ABNORMAL LOW (ref 13.0–17.7)
Immature Grans (Abs): 0 10*3/uL (ref 0.0–0.1)
Immature Granulocytes: 0 %
Lymphocytes Absolute: 1.6 10*3/uL (ref 0.7–3.1)
Lymphs: 10 %
MCH: 27.5 pg (ref 26.6–33.0)
MCHC: 32.5 g/dL (ref 31.5–35.7)
MCV: 85 fL (ref 79–97)
Monocytes Absolute: 1 10*3/uL — ABNORMAL HIGH (ref 0.1–0.9)
Monocytes: 7 %
Neutrophils Absolute: 12.4 10*3/uL — ABNORMAL HIGH (ref 1.4–7.0)
Neutrophils: 82 %
Platelets: 231 10*3/uL (ref 150–450)
RBC: 4.55 x10E6/uL (ref 4.14–5.80)
RDW: 13.9 % (ref 11.6–15.4)
WBC: 15.1 10*3/uL — ABNORMAL HIGH (ref 3.4–10.8)

## 2022-03-29 LAB — TSH: TSH: 2.3 u[IU]/mL (ref 0.450–4.500)

## 2022-04-01 ENCOUNTER — Ambulatory Visit: Payer: Medicare Other | Admitting: Internal Medicine

## 2022-04-05 ENCOUNTER — Other Ambulatory Visit: Payer: Self-pay

## 2022-04-05 MED ORDER — SITAGLIPTIN PHOSPHATE 100 MG PO TABS: 100.0000 mg | ORAL_TABLET | Freq: Every day | ORAL | 4 refills | Status: DC

## 2022-04-08 ENCOUNTER — Ambulatory Visit: Payer: Medicare Other | Admitting: Internal Medicine

## 2022-04-08 ENCOUNTER — Encounter: Payer: Self-pay | Admitting: Internal Medicine

## 2022-04-08 VITALS — BP 118/70 | HR 84 | Temp 98.2°F | Resp 18 | Ht 67.0 in | Wt 163.2 lb

## 2022-04-08 DIAGNOSIS — M48061 Spinal stenosis, lumbar region without neurogenic claudication: Secondary | ICD-10-CM | POA: Diagnosis not present

## 2022-04-08 DIAGNOSIS — R739 Hyperglycemia, unspecified: Secondary | ICD-10-CM | POA: Diagnosis not present

## 2022-04-08 DIAGNOSIS — E876 Hypokalemia: Secondary | ICD-10-CM | POA: Diagnosis not present

## 2022-04-08 DIAGNOSIS — K5720 Diverticulitis of large intestine with perforation and abscess without bleeding: Secondary | ICD-10-CM | POA: Diagnosis not present

## 2022-04-08 DIAGNOSIS — I2510 Atherosclerotic heart disease of native coronary artery without angina pectoris: Secondary | ICD-10-CM | POA: Diagnosis not present

## 2022-04-08 DIAGNOSIS — B9620 Unspecified Escherichia coli [E. coli] as the cause of diseases classified elsewhere: Secondary | ICD-10-CM | POA: Diagnosis not present

## 2022-04-08 DIAGNOSIS — E1165 Type 2 diabetes mellitus with hyperglycemia: Secondary | ICD-10-CM

## 2022-04-08 DIAGNOSIS — Z87891 Personal history of nicotine dependence: Secondary | ICD-10-CM | POA: Diagnosis not present

## 2022-04-08 DIAGNOSIS — I492 Junctional premature depolarization: Secondary | ICD-10-CM | POA: Diagnosis not present

## 2022-04-08 DIAGNOSIS — R0602 Shortness of breath: Secondary | ICD-10-CM | POA: Diagnosis not present

## 2022-04-08 DIAGNOSIS — E119 Type 2 diabetes mellitus without complications: Secondary | ICD-10-CM | POA: Diagnosis not present

## 2022-04-08 DIAGNOSIS — R197 Diarrhea, unspecified: Secondary | ICD-10-CM | POA: Diagnosis not present

## 2022-04-08 DIAGNOSIS — I110 Hypertensive heart disease with heart failure: Secondary | ICD-10-CM | POA: Diagnosis not present

## 2022-04-08 DIAGNOSIS — J449 Chronic obstructive pulmonary disease, unspecified: Secondary | ICD-10-CM | POA: Diagnosis not present

## 2022-04-08 DIAGNOSIS — K651 Peritoneal abscess: Secondary | ICD-10-CM | POA: Diagnosis not present

## 2022-04-08 DIAGNOSIS — K8020 Calculus of gallbladder without cholecystitis without obstruction: Secondary | ICD-10-CM | POA: Diagnosis not present

## 2022-04-08 DIAGNOSIS — R5383 Other fatigue: Secondary | ICD-10-CM

## 2022-04-08 DIAGNOSIS — E7800 Pure hypercholesterolemia, unspecified: Secondary | ICD-10-CM | POA: Diagnosis not present

## 2022-04-08 DIAGNOSIS — Z881 Allergy status to other antibiotic agents status: Secondary | ICD-10-CM | POA: Diagnosis not present

## 2022-04-08 DIAGNOSIS — K5780 Diverticulitis of intestine, part unspecified, with perforation and abscess without bleeding: Secondary | ICD-10-CM | POA: Diagnosis not present

## 2022-04-08 DIAGNOSIS — I509 Heart failure, unspecified: Secondary | ICD-10-CM | POA: Diagnosis not present

## 2022-04-08 DIAGNOSIS — I745 Embolism and thrombosis of iliac artery: Secondary | ICD-10-CM | POA: Diagnosis not present

## 2022-04-08 DIAGNOSIS — K6812 Psoas muscle abscess: Secondary | ICD-10-CM | POA: Diagnosis not present

## 2022-04-08 DIAGNOSIS — D7389 Other diseases of spleen: Secondary | ICD-10-CM | POA: Diagnosis not present

## 2022-04-08 DIAGNOSIS — K5732 Diverticulitis of large intestine without perforation or abscess without bleeding: Secondary | ICD-10-CM | POA: Diagnosis not present

## 2022-04-08 DIAGNOSIS — M5126 Other intervertebral disc displacement, lumbar region: Secondary | ICD-10-CM | POA: Diagnosis not present

## 2022-04-08 DIAGNOSIS — I252 Old myocardial infarction: Secondary | ICD-10-CM | POA: Diagnosis not present

## 2022-04-08 NOTE — Progress Notes (Signed)
Office Visit  Subjective   Patient ID: [name]   DOB: 1946-03-17   Age: 76 y.o.   MRN: [id number]   Chief Complaint Chief Complaint  Patient presents with   Hyperglycemia    Elevated blood sugar     History of Present Illness 76 years old male who was brought today by his daughter because his blood sugar has been high and he is progressively becoming more lethargic for the last 5 days.  Per daughter he could barely talk to him today.  His blood sugar in our office is 300.  I have started him on glipizide and Januvia as he could not take metformin and Jardiance because of stomach pain.  Daughter says that he lives by himself and normally he is alert oriented and can take care of himself.  I could not talk to him in our office because he is just sleepy.  I could barely keep him awake and could not get any answer from him.  He does not look in any distress.  His blood sugar was 363 mg/dl in our office today.  Past Medical History Past Medical History:  Diagnosis Date   Atherosclerosis of native arteries of the extremities with intermittent claudication 08/13/2013   Bladder cancer [facility])    resection x3   CAD, NATIVE VESSEL 09/22/2008   Qualifier: Diagnosis of  By: Jaramillo, [place], ISCHEMIC 09/22/2008   Qualifier: Diagnosis of  By: Sidney Ace     Chronic systolic CHF (congestive heart failure) ([place])    Cigarette smoker    Coronary artery disease    status post DMI RX Taxus stent RCA 2006 with susequent Stent LAD and subsequent  stent thrombosis RCA unable to be opened 2006 -neg mv 10/2008, 10/16/17 ISR to pLAD with PTCA/DES, CTO of RCA with collaterals, EF 25%   Coronary artery disease involving native coronary artery of native heart with unstable angina pectoris ([place])    Diverticulitis of sigmoid colon 05/11/2021   HYPERLIPIDEMIA-MIXED 09/22/2008   Qualifier: Diagnosis of  By: Jaramillo, [place], BENIGN 09/22/2008   Qualifier: Diagnosis of  By: Sidney Ace     Ischemic cardiomyopathy    ejection fraction of 40-45%   Lumbar spinal stenosis    Myocardial infarction ([place])    "I've had 4" (10/16/2017)   NSVT (nonsustained ventricular tachycardia) (HCC)    Paroxysmal atrial fibrillation (HCC)    PVC's (premature ventricular contractions)    PVD 09/22/2008   Qualifier: Diagnosis of  By: Sidney Ace     Status post coronary artery stent placement    SYSTOLIC HEART FAILURE, CHRONIC 09/22/2008   Qualifier: Diagnosis of  By: Jaramillo, [place] 05/29/2009   Qualifier: Diagnosis of  By: Earley Favor, RN, BSN, Patricia L    Type II diabetes mellitus ([place])    Unstable angina ([place]) 10/16/2017     Allergies Allergies  Allergen Reactions   Lisinopril Swelling and Rash    Rash - face and tounge swell     Review of Systems Review of Systems  Constitutional:  Positive for malaise/fatigue. Negative for chills, diaphoresis and fever.       Objective:    Vitals BP 118/70 (BP Location: Left Arm, Patient Position: Sitting, Cuff Size: Normal)   Pulse 84   Temp 98.2 F (36.8 C)   Resp 18   Ht '5\' 7"'$  (1.702 m)   Wt 163 lb 4 oz (74  kg)   SpO2 91%   BMI 25.57 kg/m    Physical Examination Physical Exam Constitutional:      Appearance: He is ill-appearing.  Cardiovascular:     Rate and Rhythm: Normal rate and regular rhythm.     Heart sounds: Normal heart sounds.  Pulmonary:     Effort: Pulmonary effort is normal.     Breath sounds: Normal breath sounds.  Neurological:     Comments: Patient is very lethargic in our office and I could barely communicate with him.  I could not do any further neurological evaluation.        Assessment & Plan:   Type 2 diabetes mellitus with hyperglycemia, without long-term current use of insulin (HCC) His sugar is 300 mg/dl in our office today, he may need long acting   Lethargy He is very lethargic so I will send to ED   No follow-ups on file.   Garwin Brothers, MD

## 2022-04-08 NOTE — Progress Notes (Signed)
   Office Visit  Subjective   Patient ID: [name]   DOB: 08-Jul-1945   Age: 76 y.o.   MRN: [id number]   Chief Complaint Chief Complaint  Patient presents with   Hyperglycemia    Elevated blood sugar     History of Present Illness 76 years old male is   Past Medical History Past Medical History:  Diagnosis Date   Atherosclerosis of native arteries of the extremities with intermittent claudication 08/13/2013   Bladder cancer ([place])    resection x3   CAD, NATIVE VESSEL 09/22/2008   Qualifier: Diagnosis of  By: Jaramillo, [place], ISCHEMIC 09/22/2008   Qualifier: Diagnosis of  By: Sidney Ace     Chronic systolic CHF (congestive heart failure) ([place])    Cigarette smoker    Coronary artery disease    status post DMI RX Taxus stent RCA 2006 with susequent Stent LAD and subsequent  stent thrombosis RCA unable to be opened 2006 -neg mv 10/2008, 10/16/17 ISR to pLAD with PTCA/DES, CTO of RCA with collaterals, EF 25%   Coronary artery disease involving native coronary artery of native heart with unstable angina pectoris ([place])    Diverticulitis of sigmoid colon 05/11/2021   HYPERLIPIDEMIA-MIXED 09/22/2008   Qualifier: Diagnosis of  By: Sidney Ace     HYPERTENSION, BENIGN 09/22/2008   Qualifier: Diagnosis of  By: Sidney Ace     Ischemic cardiomyopathy    ejection fraction of 40-45%   Lumbar spinal stenosis    Myocardial infarction ([place])    "I've had 4" (10/16/2017)   NSVT (nonsustained ventricular tachycardia) (HCC)    Paroxysmal atrial fibrillation (HCC)    PVC's (premature ventricular contractions)    PVD 09/22/2008   Qualifier: Diagnosis of  By: Sidney Ace     Status post coronary artery stent placement    SYSTOLIC HEART FAILURE, CHRONIC 09/22/2008   Qualifier: Diagnosis of  By: Jaramillo, [place] 05/29/2009   Qualifier: Diagnosis of  By: Earley Favor RN, BSN, Patricia L    Type II diabetes mellitus ([place])    Unstable angina ([place]) 10/16/2017      Allergies Allergies  Allergen Reactions   Lisinopril Swelling and Rash    Rash - face and tounge swell     Review of Systems ROS     Objective:    Vitals BP 118/70 (BP Location: Left Arm, Patient Position: Sitting, Cuff Size: Normal)   Pulse 84   Temp 98.2 F (36.8 C)   Resp 18   Ht '5\' 7"'$  (1.702 m)   Wt 163 lb 4 oz (74 kg)   SpO2 91%   BMI 25.57 kg/m    Physical Examination Physical Exam     Assessment & Plan:   No problem-specific Assessment & Plan notes found for this encounter.    No follow-ups on file.   Garwin Brothers, MD

## 2022-04-08 NOTE — Assessment & Plan Note (Signed)
He is very lethargic so I will send to ED

## 2022-04-08 NOTE — Assessment & Plan Note (Signed)
His sugar is 300 mg/dl in our office today, he may need long acting

## 2022-04-22 ENCOUNTER — Encounter: Payer: Self-pay | Admitting: Internal Medicine

## 2022-04-22 ENCOUNTER — Ambulatory Visit: Payer: Medicare Other | Admitting: Internal Medicine

## 2022-04-22 VITALS — BP 122/78 | HR 64 | Temp 98.2°F | Resp 18 | Ht 67.0 in | Wt 157.1 lb

## 2022-04-22 DIAGNOSIS — I480 Paroxysmal atrial fibrillation: Secondary | ICD-10-CM

## 2022-04-22 DIAGNOSIS — E1165 Type 2 diabetes mellitus with hyperglycemia: Secondary | ICD-10-CM

## 2022-04-22 DIAGNOSIS — K5720 Diverticulitis of large intestine with perforation and abscess without bleeding: Secondary | ICD-10-CM

## 2022-04-22 DIAGNOSIS — M48062 Spinal stenosis, lumbar region with neurogenic claudication: Secondary | ICD-10-CM

## 2022-04-22 NOTE — Progress Notes (Unsigned)
Established Patient Office Visit  Subjective   Patient ID: [name], male    DOB: [birth year]  Age: 77 y.o. MRN: [id number]  Chief Complaint  Patient presents with   Follow-up    Hospital    HPI 77 years old male who is here for follow-up from hospital discharge and this is a transition of care visit.  He was admitted to hospital on December 22 with abdominal pain associated with diarrhea, weakness, lethargy and shortness of breath.  He was treated for sigmoid diverticulitis with adjacent abscess without evidence of pneumoperitoneum.  There was additional air and layering fluid containing collection of along with left iliopsoas muscle body and suspicious for abscess and fistula.  Patient underwent CT-guided left pelvic abscess drain placed on December 22.  Blood culture remain negative.  Patient was initially started on ertapenem and then transition to IV Zyvox.  He was also noted to have a bulging disc, with moderate to severe spinal canal stenosis on CT abdomen and pelvis.  His electrolytes were replaced.  Lab chart and medications were reviewed.  He was discharged home on Augmentin and Flagyl for 10 days.  He will need outpatient colonoscopy.  His daughter tells me that his blood sugar was controlled on long-acting insulin.  He cannot afford Januvia.   Review of Systems  Constitutional: Negative.   HENT: Negative.    Respiratory: Negative.    Cardiovascular: Negative.   Gastrointestinal: Negative.        Drain catheter placed in LLQ.  Neurological: Negative.       Objective:     BP 122/78 (BP Location: Left Arm, Patient Position: Sitting, Cuff Size: Normal)   Pulse 64   Temp 98.2 F (36.8 C)   Resp 18   Ht '5\' 7"'$  (1.702 m)   Wt 157 lb 2 oz (71.3 kg)   SpO2 97%   BMI 24.61 kg/m    Physical Exam Constitutional:      Appearance: Normal appearance.  HENT:     Head: Normocephalic.  Eyes:     Extraocular Movements: Extraocular movements intact.     Pupils: Pupils are  equal, round, and reactive to light.  Cardiovascular:     Rate and Rhythm: Normal rate and regular rhythm.     Heart sounds: Normal heart sounds.  Pulmonary:     Effort: Pulmonary effort is normal.  Abdominal:     General: Bowel sounds are normal.     Palpations: Abdomen is soft.     Comments: Drain catheter was placed left lower quadrant.  Neurological:     General: No focal deficit present.     Mental Status: He is alert and oriented to person, place, and time.      No results found for any visits on 04/22/22.    The ASCVD Risk score (Arnett DK, et al., 2019) failed to calculate for the following reasons:   The patient has a prior MI or stroke diagnosis    Assessment & Plan:   Problem List Items Addressed This Visit       Cardiovascular and Mediastinum   Paroxysmal atrial fibrillation (HCC)    His rate is controlled and he is on Xarelto as anticoagulation.      Relevant Medications   carvedilol (COREG) 12.5 MG tablet     Digestive   Diverticulitis large intestine - Primary    Patient still has drain in place to the left lower quadrant.  Dressing was changed and he will follow-up  with general surgery. He will complete antibiotic course.  I have reviewed discharge medication.  This is a transition of care visit and our office have contacted patient.        Endocrine   Type 2 diabetes mellitus with hyperglycemia, without long-term current use of insulin ([place])    I have discussed with the daughter that we can add long-acting insulin after we review his labs.  For now we will only continue with the oral glycemic agents.  I will stop Januvia as patient cannot afford.        Other   Lumbar spinal stenosis    We will continue to monitor his back pain.  He will also follow-up with neurosurgery.       No follow-ups on file.    Garwin Brothers, MD

## 2022-04-23 NOTE — Assessment & Plan Note (Addendum)
Patient still has drain in place to the left lower quadrant.  Dressing was changed and he will follow-up with general surgery. He will complete antibiotic course.  I have reviewed discharge medication.  This is a transition of care visit and our office have contacted patient.

## 2022-04-23 NOTE — Assessment & Plan Note (Signed)
I have discussed with the daughter that we can add long-acting insulin after we review his labs.  For now we will only continue with the oral glycemic agents.  I will stop Januvia as patient cannot afford.

## 2022-04-23 NOTE — Assessment & Plan Note (Addendum)
We will continue to monitor his back pain.  He will also follow-up with neurosurgery.

## 2022-04-23 NOTE — Patient Instructions (Signed)
Is a transition of care visit.  I have reviewed lab with the patient and his daughter.  Plan was discussed with the patient and his daughter.

## 2022-04-23 NOTE — Assessment & Plan Note (Signed)
His rate is controlled and he is on Xarelto as anticoagulation.

## 2022-04-27 ENCOUNTER — Ambulatory Visit: Payer: Medicare Other | Admitting: Internal Medicine

## 2022-04-29 DIAGNOSIS — K5780 Diverticulitis of intestine, part unspecified, with perforation and abscess without bleeding: Secondary | ICD-10-CM | POA: Diagnosis not present

## 2022-04-29 DIAGNOSIS — K5732 Diverticulitis of large intestine without perforation or abscess without bleeding: Secondary | ICD-10-CM | POA: Diagnosis not present

## 2022-04-29 DIAGNOSIS — K769 Liver disease, unspecified: Secondary | ICD-10-CM | POA: Diagnosis not present

## 2022-04-29 DIAGNOSIS — K5792 Diverticulitis of intestine, part unspecified, without perforation or abscess without bleeding: Secondary | ICD-10-CM | POA: Diagnosis not present

## 2022-05-04 DIAGNOSIS — Z8719 Personal history of other diseases of the digestive system: Secondary | ICD-10-CM | POA: Diagnosis not present

## 2022-05-08 DIAGNOSIS — E1165 Type 2 diabetes mellitus with hyperglycemia: Secondary | ICD-10-CM

## 2022-05-08 DIAGNOSIS — K5720 Diverticulitis of large intestine with perforation and abscess without bleeding: Secondary | ICD-10-CM | POA: Diagnosis not present

## 2022-05-08 DIAGNOSIS — M48062 Spinal stenosis, lumbar region with neurogenic claudication: Secondary | ICD-10-CM | POA: Diagnosis not present

## 2022-05-08 DIAGNOSIS — I480 Paroxysmal atrial fibrillation: Secondary | ICD-10-CM | POA: Diagnosis not present

## 2022-05-12 DIAGNOSIS — Z4682 Encounter for fitting and adjustment of non-vascular catheter: Secondary | ICD-10-CM | POA: Diagnosis not present

## 2022-05-12 DIAGNOSIS — Z8719 Personal history of other diseases of the digestive system: Secondary | ICD-10-CM | POA: Diagnosis not present

## 2022-05-12 DIAGNOSIS — L02211 Cutaneous abscess of abdominal wall: Secondary | ICD-10-CM | POA: Diagnosis not present

## 2022-05-19 ENCOUNTER — Telehealth: Payer: Self-pay

## 2022-05-19 DIAGNOSIS — Z7902 Long term (current) use of antithrombotics/antiplatelets: Secondary | ICD-10-CM | POA: Diagnosis not present

## 2022-05-19 DIAGNOSIS — Z87891 Personal history of nicotine dependence: Secondary | ICD-10-CM | POA: Diagnosis not present

## 2022-05-19 DIAGNOSIS — E1122 Type 2 diabetes mellitus with diabetic chronic kidney disease: Secondary | ICD-10-CM | POA: Diagnosis not present

## 2022-05-19 DIAGNOSIS — Z7901 Long term (current) use of anticoagulants: Secondary | ICD-10-CM | POA: Diagnosis not present

## 2022-05-19 DIAGNOSIS — Z7984 Long term (current) use of oral hypoglycemic drugs: Secondary | ICD-10-CM | POA: Diagnosis not present

## 2022-05-19 DIAGNOSIS — K5720 Diverticulitis of large intestine with perforation and abscess without bleeding: Secondary | ICD-10-CM | POA: Diagnosis not present

## 2022-05-19 DIAGNOSIS — I129 Hypertensive chronic kidney disease with stage 1 through stage 4 chronic kidney disease, or unspecified chronic kidney disease: Secondary | ICD-10-CM | POA: Diagnosis not present

## 2022-05-19 DIAGNOSIS — Z881 Allergy status to other antibiotic agents status: Secondary | ICD-10-CM | POA: Diagnosis not present

## 2022-05-19 DIAGNOSIS — R0602 Shortness of breath: Secondary | ICD-10-CM | POA: Diagnosis not present

## 2022-05-19 DIAGNOSIS — I493 Ventricular premature depolarization: Secondary | ICD-10-CM | POA: Diagnosis not present

## 2022-05-19 DIAGNOSIS — E7800 Pure hypercholesterolemia, unspecified: Secondary | ICD-10-CM | POA: Diagnosis not present

## 2022-05-19 DIAGNOSIS — Z4682 Encounter for fitting and adjustment of non-vascular catheter: Secondary | ICD-10-CM | POA: Diagnosis not present

## 2022-05-19 DIAGNOSIS — E1151 Type 2 diabetes mellitus with diabetic peripheral angiopathy without gangrene: Secondary | ICD-10-CM | POA: Diagnosis not present

## 2022-05-19 DIAGNOSIS — I429 Cardiomyopathy, unspecified: Secondary | ICD-10-CM | POA: Diagnosis not present

## 2022-05-19 DIAGNOSIS — D509 Iron deficiency anemia, unspecified: Secondary | ICD-10-CM | POA: Diagnosis not present

## 2022-05-19 DIAGNOSIS — N189 Chronic kidney disease, unspecified: Secondary | ICD-10-CM | POA: Diagnosis not present

## 2022-05-19 DIAGNOSIS — L988 Other specified disorders of the skin and subcutaneous tissue: Secondary | ICD-10-CM | POA: Diagnosis not present

## 2022-05-19 DIAGNOSIS — R109 Unspecified abdominal pain: Secondary | ICD-10-CM | POA: Diagnosis not present

## 2022-05-19 DIAGNOSIS — Z79899 Other long term (current) drug therapy: Secondary | ICD-10-CM | POA: Diagnosis not present

## 2022-05-19 DIAGNOSIS — Z8719 Personal history of other diseases of the digestive system: Secondary | ICD-10-CM | POA: Diagnosis not present

## 2022-05-19 DIAGNOSIS — J449 Chronic obstructive pulmonary disease, unspecified: Secondary | ICD-10-CM | POA: Diagnosis not present

## 2022-05-19 DIAGNOSIS — I2510 Atherosclerotic heart disease of native coronary artery without angina pectoris: Secondary | ICD-10-CM | POA: Diagnosis not present

## 2022-05-19 DIAGNOSIS — L02211 Cutaneous abscess of abdominal wall: Secondary | ICD-10-CM | POA: Diagnosis not present

## 2022-05-19 DIAGNOSIS — N179 Acute kidney failure, unspecified: Secondary | ICD-10-CM | POA: Diagnosis not present

## 2022-05-19 DIAGNOSIS — I639 Cerebral infarction, unspecified: Secondary | ICD-10-CM | POA: Diagnosis not present

## 2022-05-19 DIAGNOSIS — I252 Old myocardial infarction: Secondary | ICD-10-CM | POA: Diagnosis not present

## 2022-05-19 DIAGNOSIS — R1030 Lower abdominal pain, unspecified: Secondary | ICD-10-CM | POA: Diagnosis not present

## 2022-05-19 DIAGNOSIS — K5792 Diverticulitis of intestine, part unspecified, without perforation or abscess without bleeding: Secondary | ICD-10-CM | POA: Diagnosis not present

## 2022-05-19 DIAGNOSIS — K632 Fistula of intestine: Secondary | ICD-10-CM | POA: Diagnosis not present

## 2022-05-19 DIAGNOSIS — Z888 Allergy status to other drugs, medicaments and biological substances status: Secondary | ICD-10-CM | POA: Diagnosis not present

## 2022-05-19 NOTE — Progress Notes (Signed)
First attempt to reach patient to schedule for [facility] -LVM

## 2022-05-21 DIAGNOSIS — I493 Ventricular premature depolarization: Secondary | ICD-10-CM

## 2022-05-24 ENCOUNTER — Inpatient Hospital Stay (HOSPITAL_COMMUNITY)
Admission: EM | Admit: 2022-05-24 | Discharge: 2022-06-03 | DRG: 329 | Disposition: A | Discharge status: Home Health | Payer: Medicare Other | Source: Other Acute Inpatient Hospital | Attending: Internal Medicine | Admitting: Internal Medicine

## 2022-05-24 ENCOUNTER — Encounter (HOSPITAL_COMMUNITY): Payer: Self-pay

## 2022-05-24 ENCOUNTER — Other Ambulatory Visit: Payer: Self-pay

## 2022-05-24 DIAGNOSIS — Z7984 Long term (current) use of oral hypoglycemic drugs: Secondary | ICD-10-CM

## 2022-05-24 DIAGNOSIS — E0781 Sick-euthyroid syndrome: Secondary | ICD-10-CM | POA: Diagnosis present

## 2022-05-24 DIAGNOSIS — Z833 Family history of diabetes mellitus: Secondary | ICD-10-CM | POA: Diagnosis not present

## 2022-05-24 DIAGNOSIS — K660 Peritoneal adhesions (postprocedural) (postinfection): Secondary | ICD-10-CM | POA: Diagnosis present

## 2022-05-24 DIAGNOSIS — Z978 Presence of other specified devices: Secondary | ICD-10-CM | POA: Diagnosis not present

## 2022-05-24 DIAGNOSIS — E1151 Type 2 diabetes mellitus with diabetic peripheral angiopathy without gangrene: Secondary | ICD-10-CM | POA: Diagnosis present

## 2022-05-24 DIAGNOSIS — E785 Hyperlipidemia, unspecified: Secondary | ICD-10-CM | POA: Diagnosis present

## 2022-05-24 DIAGNOSIS — I700 Atherosclerosis of aorta: Secondary | ICD-10-CM | POA: Diagnosis present

## 2022-05-24 DIAGNOSIS — Z01818 Encounter for other preprocedural examination: Secondary | ICD-10-CM | POA: Diagnosis not present

## 2022-05-24 DIAGNOSIS — Z87891 Personal history of nicotine dependence: Secondary | ICD-10-CM

## 2022-05-24 DIAGNOSIS — Z955 Presence of coronary angioplasty implant and graft: Secondary | ICD-10-CM | POA: Diagnosis not present

## 2022-05-24 DIAGNOSIS — R0781 Pleurodynia: Secondary | ICD-10-CM | POA: Diagnosis not present

## 2022-05-24 DIAGNOSIS — E039 Hypothyroidism, unspecified: Secondary | ICD-10-CM | POA: Diagnosis present

## 2022-05-24 DIAGNOSIS — Z8551 Personal history of malignant neoplasm of bladder: Secondary | ICD-10-CM | POA: Diagnosis not present

## 2022-05-24 DIAGNOSIS — I2510 Atherosclerotic heart disease of native coronary artery without angina pectoris: Secondary | ICD-10-CM | POA: Diagnosis not present

## 2022-05-24 DIAGNOSIS — K5730 Diverticulosis of large intestine without perforation or abscess without bleeding: Secondary | ICD-10-CM | POA: Diagnosis not present

## 2022-05-24 DIAGNOSIS — Z452 Encounter for adjustment and management of vascular access device: Secondary | ICD-10-CM | POA: Diagnosis not present

## 2022-05-24 DIAGNOSIS — K5732 Diverticulitis of large intestine without perforation or abscess without bleeding: Secondary | ICD-10-CM | POA: Diagnosis not present

## 2022-05-24 DIAGNOSIS — K5780 Diverticulitis of intestine, part unspecified, with perforation and abscess without bleeding: Secondary | ICD-10-CM | POA: Diagnosis not present

## 2022-05-24 DIAGNOSIS — Z79899 Other long term (current) drug therapy: Secondary | ICD-10-CM | POA: Diagnosis not present

## 2022-05-24 DIAGNOSIS — R918 Other nonspecific abnormal finding of lung field: Secondary | ICD-10-CM | POA: Diagnosis not present

## 2022-05-24 DIAGNOSIS — Z8249 Family history of ischemic heart disease and other diseases of the circulatory system: Secondary | ICD-10-CM

## 2022-05-24 DIAGNOSIS — J449 Chronic obstructive pulmonary disease, unspecified: Secondary | ICD-10-CM | POA: Diagnosis present

## 2022-05-24 DIAGNOSIS — J439 Emphysema, unspecified: Secondary | ICD-10-CM | POA: Diagnosis not present

## 2022-05-24 DIAGNOSIS — K658 Other peritonitis: Secondary | ICD-10-CM | POA: Diagnosis not present

## 2022-05-24 DIAGNOSIS — I255 Ischemic cardiomyopathy: Secondary | ICD-10-CM | POA: Diagnosis present

## 2022-05-24 DIAGNOSIS — I252 Old myocardial infarction: Secondary | ICD-10-CM | POA: Diagnosis not present

## 2022-05-24 DIAGNOSIS — R7989 Other specified abnormal findings of blood chemistry: Secondary | ICD-10-CM | POA: Diagnosis not present

## 2022-05-24 DIAGNOSIS — Z7982 Long term (current) use of aspirin: Secondary | ICD-10-CM

## 2022-05-24 DIAGNOSIS — N179 Acute kidney failure, unspecified: Secondary | ICD-10-CM | POA: Diagnosis present

## 2022-05-24 DIAGNOSIS — Z4682 Encounter for fitting and adjustment of non-vascular catheter: Secondary | ICD-10-CM | POA: Diagnosis not present

## 2022-05-24 DIAGNOSIS — N200 Calculus of kidney: Secondary | ICD-10-CM | POA: Diagnosis not present

## 2022-05-24 DIAGNOSIS — I509 Heart failure, unspecified: Secondary | ICD-10-CM | POA: Diagnosis not present

## 2022-05-24 DIAGNOSIS — E876 Hypokalemia: Secondary | ICD-10-CM | POA: Diagnosis not present

## 2022-05-24 DIAGNOSIS — I480 Paroxysmal atrial fibrillation: Secondary | ICD-10-CM | POA: Diagnosis present

## 2022-05-24 DIAGNOSIS — M48061 Spinal stenosis, lumbar region without neurogenic claudication: Secondary | ICD-10-CM | POA: Diagnosis not present

## 2022-05-24 DIAGNOSIS — I5022 Chronic systolic (congestive) heart failure: Secondary | ICD-10-CM | POA: Diagnosis not present

## 2022-05-24 DIAGNOSIS — I25119 Atherosclerotic heart disease of native coronary artery with unspecified angina pectoris: Secondary | ICD-10-CM | POA: Diagnosis not present

## 2022-05-24 DIAGNOSIS — I5021 Acute systolic (congestive) heart failure: Secondary | ICD-10-CM | POA: Diagnosis not present

## 2022-05-24 DIAGNOSIS — Z888 Allergy status to other drugs, medicaments and biological substances status: Secondary | ICD-10-CM

## 2022-05-24 DIAGNOSIS — I2583 Coronary atherosclerosis due to lipid rich plaque: Secondary | ICD-10-CM | POA: Diagnosis not present

## 2022-05-24 DIAGNOSIS — E43 Unspecified severe protein-calorie malnutrition: Secondary | ICD-10-CM | POA: Diagnosis not present

## 2022-05-24 DIAGNOSIS — K5720 Diverticulitis of large intestine with perforation and abscess without bleeding: Principal | ICD-10-CM | POA: Diagnosis present

## 2022-05-24 DIAGNOSIS — E119 Type 2 diabetes mellitus without complications: Secondary | ICD-10-CM | POA: Diagnosis not present

## 2022-05-24 DIAGNOSIS — J9811 Atelectasis: Secondary | ICD-10-CM | POA: Diagnosis not present

## 2022-05-24 DIAGNOSIS — I110 Hypertensive heart disease with heart failure: Secondary | ICD-10-CM | POA: Diagnosis present

## 2022-05-24 DIAGNOSIS — Z7902 Long term (current) use of antithrombotics/antiplatelets: Secondary | ICD-10-CM

## 2022-05-24 DIAGNOSIS — J90 Pleural effusion, not elsewhere classified: Secondary | ICD-10-CM | POA: Diagnosis not present

## 2022-05-24 DIAGNOSIS — Z83438 Family history of other disorder of lipoprotein metabolism and other lipidemia: Secondary | ICD-10-CM

## 2022-05-24 DIAGNOSIS — K5792 Diverticulitis of intestine, part unspecified, without perforation or abscess without bleeding: Secondary | ICD-10-CM | POA: Diagnosis not present

## 2022-05-24 DIAGNOSIS — Z6824 Body mass index (BMI) 24.0-24.9, adult: Secondary | ICD-10-CM

## 2022-05-24 DIAGNOSIS — Z7901 Long term (current) use of anticoagulants: Secondary | ICD-10-CM

## 2022-05-24 DIAGNOSIS — K8020 Calculus of gallbladder without cholecystitis without obstruction: Secondary | ICD-10-CM | POA: Diagnosis not present

## 2022-05-24 DIAGNOSIS — K632 Fistula of intestine: Secondary | ICD-10-CM | POA: Diagnosis not present

## 2022-05-24 HISTORY — DX: Hypothyroidism, unspecified: E03.9

## 2022-05-24 HISTORY — DX: Diverticulitis of large intestine with perforation and abscess without bleeding: K57.20

## 2022-05-24 HISTORY — DX: Acute kidney failure, unspecified: N17.9

## 2022-05-24 LAB — T4, FREE: Free T4: 1.31 ng/dL — ABNORMAL HIGH (ref 0.61–1.12)

## 2022-05-24 LAB — MRSA NEXT GEN BY PCR, NASAL: MRSA by PCR Next Gen: NOT DETECTED

## 2022-05-24 LAB — GLUCOSE, CAPILLARY: Glucose-Capillary: 149 mg/dL — ABNORMAL HIGH (ref 70–99)

## 2022-05-24 LAB — APTT: aPTT: 40 seconds — ABNORMAL HIGH (ref 24–36)

## 2022-05-24 LAB — TSH: TSH: 6.794 u[IU]/mL — ABNORMAL HIGH (ref 0.350–4.500)

## 2022-05-24 LAB — HEPARIN LEVEL (UNFRACTIONATED): Heparin Unfractionated: 1.02 IU/mL — ABNORMAL HIGH (ref 0.30–0.70)

## 2022-05-24 MED ORDER — HEPARIN BOLUS VIA INFUSION
4500.0000 [IU] | Freq: Once | INTRAVENOUS | Status: AC
  Administered 2022-05-24: 4500 [IU] via INTRAVENOUS
  Filled 2022-05-24: qty 4500

## 2022-05-24 MED ORDER — INSULIN GLARGINE-YFGN 100 UNIT/ML ~~LOC~~ SOLN
5.0000 [IU] | Freq: Every day | SUBCUTANEOUS | Status: DC
  Administered 2022-05-24 – 2022-05-27 (×4): 5 [IU] via SUBCUTANEOUS
  Filled 2022-05-24 (×6): qty 0.05

## 2022-05-24 MED ORDER — ACETAMINOPHEN 325 MG PO TABS: 650.0000 mg | ORAL_TABLET | Freq: Four times a day (QID) | ORAL | Status: DC | PRN

## 2022-05-24 MED ORDER — MELATONIN 3 MG PO TABS
3.0000 mg | ORAL_TABLET | Freq: Every evening | ORAL | Status: DC | PRN
  Administered 2022-05-25 – 2022-06-02 (×7): 3 mg via ORAL
  Filled 2022-05-24 (×7): qty 1

## 2022-05-24 MED ORDER — ACETAMINOPHEN 650 MG RE SUPP: 650.0000 mg | Freq: Four times a day (QID) | RECTAL | Status: DC | PRN

## 2022-05-24 MED ORDER — ONDANSETRON HCL 4 MG PO TABS
4.0000 mg | ORAL_TABLET | Freq: Four times a day (QID) | ORAL | Status: DC | PRN
  Administered 2022-05-29 (×2): 4 mg via ORAL
  Filled 2022-05-24 (×2): qty 1

## 2022-05-24 MED ORDER — LACTATED RINGERS IV SOLN: INTRAVENOUS | Status: DC

## 2022-05-24 MED ORDER — CARVEDILOL 12.5 MG PO TABS
12.5000 mg | ORAL_TABLET | Freq: Two times a day (BID) | ORAL | Status: DC
  Administered 2022-05-26 – 2022-06-03 (×17): 12.5 mg via ORAL
  Filled 2022-05-24 (×18): qty 1

## 2022-05-24 MED ORDER — ONDANSETRON HCL 4 MG/2ML IJ SOLN
4.0000 mg | Freq: Four times a day (QID) | INTRAMUSCULAR | Status: DC | PRN
  Administered 2022-05-29: 4 mg via INTRAVENOUS
  Filled 2022-05-24: qty 2

## 2022-05-24 MED ORDER — PIPERACILLIN-TAZOBACTAM 3.375 G IVPB
3.3750 g | Freq: Three times a day (TID) | INTRAVENOUS | Status: DC
  Administered 2022-05-25 – 2022-05-26 (×5): 3.375 g via INTRAVENOUS
  Filled 2022-05-24 (×5): qty 50

## 2022-05-24 MED ORDER — INSULIN ASPART 100 UNIT/ML IJ SOLN
0.0000 [IU] | INTRAMUSCULAR | Status: DC
  Administered 2022-05-24 – 2022-05-25 (×4): 1 [IU] via SUBCUTANEOUS
  Administered 2022-05-26: 3 [IU] via SUBCUTANEOUS
  Administered 2022-05-26: 1 [IU] via SUBCUTANEOUS
  Administered 2022-05-27: 7 [IU] via SUBCUTANEOUS
  Administered 2022-05-27: 3 [IU] via SUBCUTANEOUS
  Administered 2022-05-27: 5 [IU] via SUBCUTANEOUS
  Administered 2022-05-27 – 2022-05-28 (×3): 2 [IU] via SUBCUTANEOUS
  Administered 2022-05-28: 3 [IU] via SUBCUTANEOUS
  Administered 2022-05-28 (×3): 2 [IU] via SUBCUTANEOUS
  Administered 2022-05-28: 3 [IU] via SUBCUTANEOUS
  Administered 2022-05-29 (×3): 2 [IU] via SUBCUTANEOUS
  Administered 2022-05-29 – 2022-05-30 (×3): 1 [IU] via SUBCUTANEOUS
  Administered 2022-05-30 (×2): 2 [IU] via SUBCUTANEOUS
  Administered 2022-05-30: 1 [IU] via SUBCUTANEOUS
  Administered 2022-05-31 (×2): 2 [IU] via SUBCUTANEOUS
  Administered 2022-05-31: 3 [IU] via SUBCUTANEOUS
  Administered 2022-05-31 – 2022-06-01 (×4): 1 [IU] via SUBCUTANEOUS
  Administered 2022-06-01: 2 [IU] via SUBCUTANEOUS

## 2022-05-24 MED ORDER — HEPARIN (PORCINE) 25000 UT/250ML-% IV SOLN
1700.0000 [IU]/h | INTRAVENOUS | Status: DC
  Administered 2022-05-24: 1150 [IU]/h via INTRAVENOUS
  Administered 2022-05-25: 1350 [IU]/h via INTRAVENOUS
  Administered 2022-05-26: 1700 [IU]/h via INTRAVENOUS
  Filled 2022-05-24 (×3): qty 250

## 2022-05-24 MED ORDER — INSULIN NPH (HUMAN) (ISOPHANE) 100 UNIT/ML ~~LOC~~ SUSP: 5.0000 [IU] | Freq: Two times a day (BID) | SUBCUTANEOUS | Status: DC

## 2022-05-24 MED ORDER — HYDROMORPHONE HCL 1 MG/ML IJ SOLN
0.5000 mg | INTRAMUSCULAR | Status: DC | PRN
  Administered 2022-05-24: 0.5 mg via INTRAVENOUS
  Filled 2022-05-24: qty 1

## 2022-05-24 NOTE — H&P (Signed)
History and Physical    Patient: [name] QVZ:[id number] DOB: 10-07-1945 DOA: 05/24/2022 DOS: the patient was seen and examined on 05/24/2022 PCP: Garwin Brothers, MD  Patient coming from: Outside Hospital  Chief Complaint:  Chief Complaint  Patient presents with   Diverticulitis   HPI: [name] is a 77 y.o. male with medical history significant of PAF, CAD s/p stent, ICM with EF of 25% as of Nov 2023, PAD s/p aorto bifembypass in 1992 and fem-fem bypass (maybe with fem-pop) by Dr. Donnetta Hutching in 2011, HTN, DM2, COPD quit smoking in 2022.  Pt with diverticulitis with abscess, initially on Apr 08, 2022.  Had IR drain placed.  Seemed to be doing better for month of Jan, drainage had basically stopped.  But then sudden worsening of abd pain and increased drain output saw him admitted to [facility] on 2/1.  IR repositioned drain on 2/2 but had ongoing feculent output despite this and ABx.  Ultimately surgery offered on 2/6 but family wanted him transferred for the surgery to larger hospital.  Pt transferred to [facility].  On IV zosyn while at [facility].  Reportedly cultures (presumably of abscess) grew B fragilis, E. coli and actinomyces while at [facility], though I dont see these results (just see BCx results which were all negative).   Review of Systems: As mentioned in the history of present illness. All other systems reviewed and are negative. Past Medical History:  Diagnosis Date   Atherosclerosis of native arteries of the extremities with intermittent claudication 08/13/2013   Bladder cancer [facility])    resection x3   CAD, NATIVE VESSEL 09/22/2008   Qualifier: Diagnosis of  By: Jaramillo, [place], ISCHEMIC 09/22/2008   Qualifier: Diagnosis of  By: Sidney Ace     Chronic systolic CHF (congestive heart failure) ([place])    Cigarette smoker    Coronary artery disease    status post DMI RX Taxus stent RCA 2006 with susequent Stent LAD and subsequent  stent thrombosis RCA unable to be opened 2006 -neg mv  10/2008, 10/16/17 ISR to pLAD with PTCA/DES, CTO of RCA with collaterals, EF 25%   Coronary artery disease involving native coronary artery of native heart with unstable angina pectoris ([place])    Diverticulitis of sigmoid colon 05/11/2021   HYPERLIPIDEMIA-MIXED 09/22/2008   Qualifier: Diagnosis of  By: Jaramillo, [place], BENIGN 09/22/2008   Qualifier: Diagnosis of  By: Sidney Ace     Ischemic cardiomyopathy    ejection fraction of 40-45%   Lumbar spinal stenosis    Myocardial infarction ([place])    "I've had 4" (10/16/2017)   NSVT (nonsustained ventricular tachycardia) (HCC)    Paroxysmal atrial fibrillation (HCC)    PVC's (premature ventricular contractions)    PVD 09/22/2008   Qualifier: Diagnosis of  By: Sidney Ace     Status post coronary artery stent placement    SYSTOLIC HEART FAILURE, CHRONIC 09/22/2008   Qualifier: Diagnosis of  By: Jaramillo, [place] 05/29/2009   Qualifier: Diagnosis of  By: Earley Favor, RN, BSN, Patricia L    Type II diabetes mellitus ([place])    Unstable angina ([place]) 10/16/2017   Past Surgical History:  Procedure Laterality Date   AORTA - BILATERAL FEMORAL ARTERY BYPASS GRAFT Bilateral 1992   APPENDECTOMY     BACK SURGERY     CORONARY ANGIOPLASTY WITH STENT PLACEMENT     taxus stent  placed into his right coronary artery;  stent placed to the LAD.     CORONARY STENT INTERVENTION N/A 10/16/2017   Procedure: CORONARY STENT INTERVENTION;  Surgeon: Burnell Blanks, MD;  Location: [place] [facility];  Service: Cardiovascular;  Laterality: N/A;   FEMORAL-FEMORAL BYPASS GRAFT  07/2009   LUMBAR MICRODISCECTOMY  12/31/08   RIGHT/LEFT HEART CATH AND CORONARY ANGIOGRAPHY N/A 10/16/2017   Procedure: RIGHT/LEFT HEART CATH AND CORONARY ANGIOGRAPHY;  Surgeon: Burnell Blanks, MD;  Location: [place] [facility];  Service: Cardiovascular;  Laterality: N/A;   SHOULDER OPEN ROTATOR CUFF REPAIR Left    Social History:  reports that he quit smoking  about 2 years ago. His smoking use included cigars and cigarettes. He has a 28.00 pack-year smoking history. He has quit using smokeless tobacco.  His smokeless tobacco use included chew. He reports current alcohol use. He reports that he does not use drugs.  Allergies  Allergen Reactions   Lisinopril Swelling and Rash    Rash - face and tounge swell    Family History  Problem Relation Age of Onset   Coronary artery disease Unknown    Diabetes Unknown    Cardiomyopathy Mother    Heart disease Mother    Hyperlipidemia Mother    Coronary artery disease Father    Stroke Father    Diabetes Father    Heart disease Father        before age 41   Hyperlipidemia Father    Heart attack Father    Diabetes Sister    Heart disease Sister        before age 3   Hyperlipidemia Sister    Heart attack Sister     Prior to Admission medications   Medication Sig Start Date End Date Taking? Authorizing Provider  amLODipine (NORVASC) 5 MG tablet Take 1 tablet (5 mg total) by mouth daily. 07/08/21   Burnell Blanks, MD  aspirin EC 81 MG tablet Take 1 tablet (81 mg total) by mouth daily. Swallow whole. 05/10/21   Burnell Blanks, MD  budesonide-formoterol (SYMBICORT) 160-4.5 MCG/ACT inhaler Inhale 2 puffs into the lungs 2 (two) times daily as needed for wheezing or shortness of breath.    [provider]  carvedilol (COREG) 12.5 MG tablet Take 12.5 mg by mouth 2 (two) times daily with a meal.    [provider]  cetirizine (ZYRTEC) 10 MG tablet Take 10 mg by mouth daily. 02/01/22   [provider]  cetirizine (ZYRTEC) 10 MG tablet Take by mouth.    [provider]  clopidogrel (PLAVIX) 75 MG tablet Take by mouth.    [provider]  gabapentin (NEURONTIN) 100 MG capsule Take 100 mg by mouth 2 (two) times daily.    [provider]  glipiZIDE (GLUCOTROL) 10 MG tablet Take 1 tablet (10 mg total) by mouth 2 (two) times daily. 03/28/22    Garwin Brothers, MD  rivaroxaban (XARELTO) 20 MG TABS tablet Take by mouth.    [provider]  rosuvastatin (CRESTOR) 20 MG tablet Take 1 tablet (20 mg total) by mouth daily. 07/08/21   Burnell Blanks, MD  sertraline (ZOLOFT) 50 MG tablet Take 1 tablet (50 mg total) by mouth daily. 03/28/22 03/28/23  Garwin Brothers, MD  XARELTO 20 MG TABS tablet TAKE 1 TABLET BY MOUTH DAILY WITH SUPPER Patient taking differently: Take 20 mg by mouth every other day. 07/26/21   Burnell Blanks, MD    Physical Exam: Vitals:   05/24/22 1931 05/24/22 1951  05/24/22 1952 05/24/22 2000  BP: (!) 134/58     Pulse: (!) 46     Resp: 18     Temp: 98.5 F (36.9 C)     TempSrc: Oral     Weight:   70.4 kg 70.4 kg  Height:  '5\' 7"'$  (1.702 m)  '5\' 7"'$  (1.702 m)   Constitutional: NAD, calm, comfortable Respiratory: clear to auscultation bilaterally, no wheezing, no crackles. Normal respiratory effort. No accessory muscle use.  Cardiovascular: Regular rate and rhythm, no murmurs / rubs / gallops. No extremity edema. 2+ pedal pulses. No carotid bruits.  Abdomen: TTP drain in place Neurologic: CN 2-12 grossly intact. Sensation intact, DTR normal. Strength 5/5 in all 4.  Psychiatric: Normal judgment and insight. Alert and oriented x 3. Normal mood.   Data Reviewed:  Results are pending, will review when available.  Assessment and Plan: * Diverticulitis of large intestine with abscess Failed medical treatment and IR drainage attempts x2 with ongoing feculant output around drain, 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 Surgery offered at [facility] but daughter wanted transfer to larger hospital for surgery so pt transferred to [facility]. Gen surg consulted (Dr. Donne Hazel) NPO except sips with meds ABx = continue zosyn that they had him on at [facility] for the moment Dilaudid PRN pain Per Dr. Donne Hazel Also get repeat CT ABD/Pelvis given worsening drain output over past few days, with PO but not IV contrast IR consult  to make sure they have exhausted all tricks as far as IR drainage.  Hypothyroidism Question of new diagnosis of hypothyroidism: TSH elevated at [facility]. Will repeat so its in our system Check FT4 and T3 May need to start synthroid.  AKI (acute kidney injury) ([place]) Creat at [facility] during admit: 1.6->1.0-> 1.4 Check BMP Gentle IVF Hold entresto  SYSTOLIC HEART FAILURE, CHRONIC Hold ARB given AKI Cont BB Watch for volume overload, had episode of volume overload on 2/3 at [facility], got lasix and improved.  DM2 (diabetes mellitus, type 2) (HCC) Lantus 5u QHS Sensitive SSI Q4H for the moment.  Paroxysmal atrial fibrillation ([place]) Ran out of xarelto at home sometime in month of Jan it looks like, had been taking prior to that. Tele monitor Holding xarelto, last dose was 2/4 at [facility] Using heparin gtt to bridge instead given likely need for surgery Cont BB      Advance Care Planning:   Code Status: Full Code   Consults: Dr. Donne Hazel gen surg  Family Communication: Daughter at bedside  Severity of Illness: The appropriate patient status for this patient is INPATIENT. Inpatient status is judged to be reasonable and necessary in order to provide the required intensity of service to ensure the patient's safety. The patient's presenting symptoms, physical exam findings, and initial radiographic and laboratory data in the context of their chronic comorbidities is felt to place them at high risk for further clinical deterioration. Furthermore, it is not anticipated that the patient will be medically stable for discharge from the hospital within 2 midnights of admission.   * I certify that at the point of admission it is my clinical judgment that the patient will require inpatient hospital care spanning beyond 2 midnights from the point of admission due to high intensity of service, high risk for further deterioration and high frequency of surveillance required.*  Author: Etta Quill., DO 05/24/2022  9:14 PM  For on call review [email or web address].

## 2022-05-24 NOTE — Assessment & Plan Note (Addendum)
Question of new diagnosis of hypothyroidism: TSH elevated at [facility]. Will repeat so its in our system Check FT4 and T3 May need to start synthroid.

## 2022-05-24 NOTE — Assessment & Plan Note (Signed)
Ran out of xarelto at home sometime in month of Jan it looks like, had been taking prior to that. Tele monitor Holding xarelto, last dose was 2/4 at [facility] Using heparin gtt to bridge instead given likely need for surgery Cont BB

## 2022-05-24 NOTE — Assessment & Plan Note (Signed)
Hold ARB given AKI Cont BB Watch for volume overload, had episode of volume overload on 2/3 at [facility], got lasix and improved.

## 2022-05-24 NOTE — Assessment & Plan Note (Deleted)
Hold ARB given AKI Cont BB Watch for volume overload, had episode of volume overload on 2/3 at [facility], got lasix and improved.

## 2022-05-24 NOTE — Consult Note (Signed)
Reason for Consult:diverticulitis Referring Physician: Dr Nilda Riggs is an 77 y.o. male.  HPI: 82yom with mmp including cad, prior afb/fem-fem who has had complicated diverticulitis it sounds like for over a month.  He had a drain placed for first time Dec 22 at [facility].  He got better apparently and then was seen for increasing ab pain.  Readmitted and placed on abx.  Had repeat ct scan and 2/2 had drain sized up and appeared to be feculent. He was offered surgery there and family requested transfer. He complains of ab pain, drain in place when I see him.   Urinating, having some bowel function  Past Medical History:  Diagnosis Date   Atherosclerosis of native arteries of the extremities with intermittent claudication 08/13/2013   Bladder cancer ([place])    resection x3   CAD, NATIVE VESSEL 09/22/2008   Qualifier: Diagnosis of  By: Jaramillo, [place], ISCHEMIC 09/22/2008   Qualifier: Diagnosis of  By: Sidney Ace     Chronic systolic CHF (congestive heart failure) ([place])    Cigarette smoker    Coronary artery disease    status post DMI RX Taxus stent RCA 2006 with susequent Stent LAD and subsequent  stent thrombosis RCA unable to be opened 2006 -neg mv 10/2008, 10/16/17 ISR to pLAD with PTCA/DES, CTO of RCA with collaterals, EF 25%   Coronary artery disease involving native coronary artery of native heart with unstable angina pectoris ([place])    Diverticulitis of sigmoid colon 05/11/2021   HYPERLIPIDEMIA-MIXED 09/22/2008   Qualifier: Diagnosis of  By: Jaramillo, [place], BENIGN 09/22/2008   Qualifier: Diagnosis of  By: Sidney Ace     Ischemic cardiomyopathy    ejection fraction of 40-45%   Lumbar spinal stenosis    Myocardial infarction ([place])    "I've had 4" (10/16/2017)   NSVT (nonsustained ventricular tachycardia) (HCC)    Paroxysmal atrial fibrillation (HCC)    PVC's (premature ventricular contractions)    PVD 09/22/2008   Qualifier: Diagnosis of  By:  Sidney Ace     Status post coronary artery stent placement    SYSTOLIC HEART FAILURE, CHRONIC 09/22/2008   Qualifier: Diagnosis of  By: Jaramillo, [place] 05/29/2009   Qualifier: Diagnosis of  By: Earley Favor, RN, BSN, Patricia L    Type II diabetes mellitus ([place])    Unstable angina ([place]) 10/16/2017    Past Surgical History:  Procedure Laterality Date   AORTA - BILATERAL FEMORAL ARTERY BYPASS GRAFT Bilateral 1992   APPENDECTOMY     BACK SURGERY     CORONARY ANGIOPLASTY WITH STENT PLACEMENT     taxus stent  placed into his right coronary artery; stent placed to the LAD.     CORONARY STENT INTERVENTION N/A 10/16/2017   Procedure: CORONARY STENT INTERVENTION;  Surgeon: Burnell Blanks, MD;  Location: [place] [facility];  Service: Cardiovascular;  Laterality: N/A;   FEMORAL-FEMORAL BYPASS GRAFT  07/2009   LUMBAR MICRODISCECTOMY  12/31/08   RIGHT/LEFT HEART CATH AND CORONARY ANGIOGRAPHY N/A 10/16/2017   Procedure: RIGHT/LEFT HEART CATH AND CORONARY ANGIOGRAPHY;  Surgeon: Burnell Blanks, MD;  Location: [place] [facility];  Service: Cardiovascular;  Laterality: N/A;   SHOULDER OPEN ROTATOR CUFF REPAIR Left     Family History  Problem Relation Age of Onset   Coronary artery disease Unknown    Diabetes Unknown    Cardiomyopathy Mother    Heart  disease Mother    Hyperlipidemia Mother    Coronary artery disease Father    Stroke Father    Diabetes Father    Heart disease Father        before age 13   Hyperlipidemia Father    Heart attack Father    Diabetes Sister    Heart disease Sister        before age 43   Hyperlipidemia Sister    Heart attack Sister     Social History:  reports that he quit smoking about 2 years ago. His smoking use included cigars and cigarettes. He has a 28.00 pack-year smoking history. He has quit using smokeless tobacco.  His smokeless tobacco use included chew. He reports current alcohol use. He reports that he does not use  drugs.  Allergies:  Allergies  Allergen Reactions   Lisinopril Swelling and Rash    Rash - face and tounge swell    Current Facility-Administered Medications  Medication Dose Route Frequency Provider Last Rate Last Admin   acetaminophen (TYLENOL) tablet 650 mg  650 mg Oral Q6H PRN Etta Quill, DO       Or   acetaminophen (TYLENOL) suppository 650 mg  650 mg Rectal Q6H PRN Etta Quill, DO       [START ON 05/25/2022] carvedilol (COREG) tablet 12.5 mg  12.5 mg Oral BID WC Jennette Kettle M, DO       HYDROmorphone (DILAUDID) injection 0.5-1 mg  0.5-1 mg Intravenous Q2H PRN Etta Quill, DO       insulin aspart (novoLOG) injection 0-9 Units  0-9 Units Subcutaneous Q4H Jennette Kettle M, DO   1 Units at 05/24/22 2128   insulin glargine-yfgn (SEMGLEE) injection 5 Units  5 Units Subcutaneous QHS Jennette Kettle M, DO       lactated ringers infusion   Intravenous Continuous Etta Quill, DO 75 mL/hr at 05/24/22 2114 New Bag at 05/24/22 2114   melatonin tablet 3 mg  3 mg Oral QHS PRN Etta Quill, DO       ondansetron [facility]) tablet 4 mg  4 mg Oral Q6H PRN Etta Quill, DO       Or   ondansetron [facility]) injection 4 mg  4 mg Intravenous Q6H PRN Etta Quill, DO       [START ON 05/25/2022] piperacillin-tazobactam (ZOSYN) IVPB 3.375 g  3.375 g Intravenous Q8H Laren Everts, [facility]         Results for orders placed or performed during the hospital encounter of 05/24/22 (from the past 48 hour(s))  Glucose, capillary     Status: Abnormal   Collection Time: 05/24/22  9:20 PM  Result Value Ref Range   Glucose-Capillary 149 (H) 70 - 99 mg/dL    Comment: Glucose reference range applies only to samples taken after fasting for at least 8 hours.    No results found.  Review of Systems  Unable to perform ROS: Other   Blood pressure (!) 134/58, pulse (!) 46, temperature 98.5 F (36.9 C), temperature source Oral, resp. rate 18, height '5\' 7"'$  (1.702 m), weight 70.4 kg. Physical  Exam Vitals reviewed.  Constitutional:      Appearance: Normal appearance.  Eyes:     General: No scleral icterus. Cardiovascular:     Rate and Rhythm: Normal rate and regular rhythm.  Pulmonary:     Effort: Pulmonary effort is normal.  Abdominal:     Palpations: Abdomen is soft.  Tenderness: There is abdominal tenderness (llq , at drain, rlq and suprapubic).     Comments: Llq drain with feculent appearing drainage  Neurological:     Mental Status: He is alert.     Assessment/Plan: Complicated diverticultis -had a drain study per some notes that didn't show communication with colon but sure appears that way now -discussed situation with he and his daughter -will get a ct scan tonight, place on abx, review with IR in am.  Then decide on plans.  May end up needing surgery while here due to not getting better after over a month.  I spent 50 minutes looking at old records, reviewing all data, discussing case with Dr Alcario Drought about plan above, as well as reviewing last ct scan and when drain was placed at [facility] 05/24/2022, 9:54 PM

## 2022-05-24 NOTE — Progress Notes (Signed)
Pharmacy Antibiotic Note  [name] is a 77 y.o. male admitted on 05/24/2022 with  diverticulitis w/ abscess and likely fistula .  Pharmacy has been consulted for Zosyn dosing.  Plan: Zosyn 3.375g IV q8h (4 hour infusion).  Height: '5\' 7"'$  (170.2 cm) Weight: 70.4 kg (155 lb 3.3 oz) IBW/kg (Calculated) : 66.1  Temp (24hrs), Avg:98.5 F (36.9 C), Min:98.5 F (36.9 C), Max:98.5 F (36.9 C)   Allergies  Allergen Reactions   Lisinopril Swelling and Rash    Rash - face and tounge swell    Thank you for allowing pharmacy to be a part of this patient's care.  Wynona Neat, PharmD, BCPS  05/24/2022 9:27 PM

## 2022-05-24 NOTE — Progress Notes (Signed)
ANTICOAGULATION CONSULT NOTE - Initial Consult  Pharmacy Consult for Heparin Indication: atrial fibrillation  Allergies  Allergen Reactions   Lisinopril Swelling and Rash    Rash - face and tounge swell    Patient Measurements: Height: '5\' 7"'$  (170.2 cm) Weight: 70.4 kg (155 lb 3.3 oz) IBW/kg (Calculated) : 66.1 Heparin Dosing Weight: 70 kg  Vital Signs: Temp: 98.5 F (36.9 C) (02/06 1931) Temp Source: Oral (02/06 1931) BP: 134/58 (02/06 1931) Pulse Rate: 46 (02/06 1931)  Labs: No results for input(s): "HGB", "HCT", "PLT", "APTT", "LABPROT", "INR", "HEPARINUNFRC", "HEPRLOWMOCWT", "CREATININE", "CKTOTAL", "CKMB", "TROPONINIHS" in the last 72 hours.  CrCl cannot be calculated (Patient's most recent lab result is older than the maximum 21 days allowed.).   Medical History: Past Medical History:  Diagnosis Date   Atherosclerosis of native arteries of the extremities with intermittent claudication 08/13/2013   Bladder cancer [facility])    resection x3   CAD, NATIVE VESSEL 09/22/2008   Qualifier: Diagnosis of  By: Jaramillo, [place], ISCHEMIC 09/22/2008   Qualifier: Diagnosis of  By: Sidney Ace     Chronic systolic CHF (congestive heart failure) ([place])    Cigarette smoker    Coronary artery disease    status post DMI RX Taxus stent RCA 2006 with susequent Stent LAD and subsequent  stent thrombosis RCA unable to be opened 2006 -neg mv 10/2008, 10/16/17 ISR to pLAD with PTCA/DES, CTO of RCA with collaterals, EF 25%   Coronary artery disease involving native coronary artery of native heart with unstable angina pectoris ([place])    Diverticulitis of sigmoid colon 05/11/2021   HYPERLIPIDEMIA-MIXED 09/22/2008   Qualifier: Diagnosis of  By: Jaramillo, [place], BENIGN 09/22/2008   Qualifier: Diagnosis of  By: Sidney Ace     Ischemic cardiomyopathy    ejection fraction of 40-45%   Lumbar spinal stenosis    Myocardial infarction ([place])    "I've had 4" (10/16/2017)    NSVT (nonsustained ventricular tachycardia) (HCC)    Paroxysmal atrial fibrillation (HCC)    PVC's (premature ventricular contractions)    PVD 09/22/2008   Qualifier: Diagnosis of  By: Sidney Ace     Status post coronary artery stent placement    SYSTOLIC HEART FAILURE, CHRONIC 09/22/2008   Qualifier: Diagnosis of  By: Jaramillo, [place] 05/29/2009   Qualifier: Diagnosis of  By: Earley Favor RN, BSN, Patricia L    Type II diabetes mellitus ([place])    Unstable angina ([place]) 10/16/2017     Assessment: 52 YOM presenting with diverticulitis and abscess of the intestine. On Xarelto PTA for Afib; however, recently ran out. Last dose 2/4 at outside hospital. Pharmacy consulted to dose heparin while determining surgery plans.  Will order baseline heparin level given questionable history. Dose based on aPTT until heparin levels are correlating  Goal of Therapy:  Heparin level 0.3-0.7 units/ml aPTT 66-102 seconds Monitor platelets by anticoagulation protocol: Yes   Plan:  Heparin 4500 units IV once then start heparin 1150 units/hr Check 8hr heparin level and aPTT at 0600 Daily heparin level/aPTT and CBC Monitor for signs/symptoms of bleeding  Merrilee Jansky, PharmD Clinical Pharmacist 05/24/2022,8:48 PM

## 2022-05-24 NOTE — Assessment & Plan Note (Signed)
Lantus 5u QHS Sensitive SSI Q4H for the moment.

## 2022-05-24 NOTE — Assessment & Plan Note (Addendum)
Failed medical treatment and IR drainage attempts x2 with ongoing feculant output around drain, 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 Surgery offered at [facility] but daughter wanted transfer to larger hospital for surgery so pt transferred to [facility]. Gen surg consulted (Dr. Donne Hazel) NPO except sips with meds ABx = continue zosyn that they had him on at [facility] for the moment Dilaudid PRN pain Per Dr. Donne Hazel Also get repeat CT ABD/Pelvis given worsening drain output over past few days, with PO but not IV contrast IR consult to make sure they have exhausted all tricks as far as IR drainage.

## 2022-05-24 NOTE — Progress Notes (Signed)
New Admission Note:   Arrival Method: Arrived from [facility] via Care link Mental Orientation: Alert and oriented x4 Telemetry: Box #19 Assessment: Completed Skin: Intact IV: Lt AC-NSL Pain: 8/10 Tubes: Lt Percutaneous drain Safety Measures: Safety Fall Prevention Plan has been discussed.  Admission: Completed 5MW Orientation: Patient has been oriented to the room, unit and staff.  Family: Daughter at bedside  Notified [employer] of patient's arrival. Pt will need admission orders. Will continue to monitor the patient. Call light has been placed within reach and bed alarm has been activated.   Ronan Duecker [employer], RN-BC Phone number: [phone]

## 2022-05-24 NOTE — Assessment & Plan Note (Addendum)
Creat at [facility] during admit: 1.6->1.0-> 1.4 Check BMP Gentle IVF Hold entresto

## 2022-05-25 ENCOUNTER — Inpatient Hospital Stay (HOSPITAL_COMMUNITY): Payer: Medicare Other

## 2022-05-25 DIAGNOSIS — Z01818 Encounter for other preprocedural examination: Secondary | ICD-10-CM | POA: Diagnosis not present

## 2022-05-25 DIAGNOSIS — K5720 Diverticulitis of large intestine with perforation and abscess without bleeding: Secondary | ICD-10-CM | POA: Diagnosis not present

## 2022-05-25 DIAGNOSIS — I5022 Chronic systolic (congestive) heart failure: Secondary | ICD-10-CM

## 2022-05-25 DIAGNOSIS — I255 Ischemic cardiomyopathy: Secondary | ICD-10-CM

## 2022-05-25 DIAGNOSIS — R0781 Pleurodynia: Secondary | ICD-10-CM | POA: Diagnosis not present

## 2022-05-25 DIAGNOSIS — R7989 Other specified abnormal findings of blood chemistry: Secondary | ICD-10-CM | POA: Diagnosis not present

## 2022-05-25 DIAGNOSIS — I2583 Coronary atherosclerosis due to lipid rich plaque: Secondary | ICD-10-CM

## 2022-05-25 DIAGNOSIS — I2510 Atherosclerotic heart disease of native coronary artery without angina pectoris: Secondary | ICD-10-CM | POA: Diagnosis not present

## 2022-05-25 LAB — COMPREHENSIVE METABOLIC PANEL
ALT: 10 U/L (ref 0–44)
AST: 17 U/L (ref 15–41)
Albumin: 2 g/dL — ABNORMAL LOW (ref 3.5–5.0)
Alkaline Phosphatase: 40 U/L (ref 38–126)
Anion gap: 13 (ref 5–15)
BUN: 15 mg/dL (ref 8–23)
CO2: 23 mmol/L (ref 22–32)
Calcium: 7.9 mg/dL — ABNORMAL LOW (ref 8.9–10.3)
Chloride: 95 mmol/L — ABNORMAL LOW (ref 98–111)
Creatinine, Ser: 1.47 mg/dL — ABNORMAL HIGH (ref 0.61–1.24)
GFR, Estimated: 49 mL/min — ABNORMAL LOW (ref >60)
Glucose, Bld: 122 mg/dL — ABNORMAL HIGH (ref 70–99)
Potassium: 3.2 mmol/L — ABNORMAL LOW (ref 3.5–5.1)
Sodium: 131 mmol/L — ABNORMAL LOW (ref 135–145)
Total Bilirubin: 0.7 mg/dL (ref 0.3–1.2)
Total Protein: 5.3 g/dL — ABNORMAL LOW (ref 6.5–8.1)

## 2022-05-25 LAB — CBC
HCT: 26 % — ABNORMAL LOW (ref 39.0–52.0)
HCT: 27.4 % — ABNORMAL LOW (ref 39.0–52.0)
Hemoglobin: 8.6 g/dL — ABNORMAL LOW (ref 13.0–17.0)
Hemoglobin: 8.8 g/dL — ABNORMAL LOW (ref 13.0–17.0)
MCH: 27.7 pg (ref 26.0–34.0)
MCH: 27.6 pg (ref 26.0–34.0)
MCHC: 33.1 g/dL (ref 30.0–36.0)
MCHC: 32.1 g/dL (ref 30.0–36.0)
MCV: 83.9 fL (ref 80.0–100.0)
MCV: 85.9 fL (ref 80.0–100.0)
Platelets: 348 10*3/uL (ref 150–400)
Platelets: 411 10*3/uL — ABNORMAL HIGH (ref 150–400)
RBC: 3.1 MIL/uL — ABNORMAL LOW (ref 4.22–5.81)
RBC: 3.19 MIL/uL — ABNORMAL LOW (ref 4.22–5.81)
RDW: 17.7 % — ABNORMAL HIGH (ref 11.5–15.5)
RDW: 17.6 % — ABNORMAL HIGH (ref 11.5–15.5)
WBC: 12.4 10*3/uL — ABNORMAL HIGH (ref 4.0–10.5)
WBC: 10 10*3/uL (ref 4.0–10.5)
nRBC: 0 % (ref 0.0–0.2)
nRBC: 0 % (ref 0.0–0.2)

## 2022-05-25 LAB — TROPONIN I (HIGH SENSITIVITY)
Troponin I (High Sensitivity): 25 ng/L — ABNORMAL HIGH (ref <18)
Troponin I (High Sensitivity): 23 ng/L — ABNORMAL HIGH (ref <18)

## 2022-05-25 LAB — APTT
aPTT: 42 seconds — ABNORMAL HIGH (ref 24–36)
aPTT: 45 seconds — ABNORMAL HIGH (ref 24–36)

## 2022-05-25 LAB — GLUCOSE, CAPILLARY
Glucose-Capillary: 106 mg/dL — ABNORMAL HIGH (ref 70–99)
Glucose-Capillary: 112 mg/dL — ABNORMAL HIGH (ref 70–99)
Glucose-Capillary: 115 mg/dL — ABNORMAL HIGH (ref 70–99)
Glucose-Capillary: 117 mg/dL — ABNORMAL HIGH (ref 70–99)
Glucose-Capillary: 128 mg/dL — ABNORMAL HIGH (ref 70–99)
Glucose-Capillary: 138 mg/dL — ABNORMAL HIGH (ref 70–99)
Glucose-Capillary: 142 mg/dL — ABNORMAL HIGH (ref 70–99)
Glucose-Capillary: 143 mg/dL — ABNORMAL HIGH (ref 70–99)

## 2022-05-25 LAB — HEPARIN LEVEL (UNFRACTIONATED)
Heparin Unfractionated: 0.63 IU/mL (ref 0.30–0.70)
Heparin Unfractionated: 0.34 IU/mL (ref 0.30–0.70)

## 2022-05-25 MED ORDER — NITROGLYCERIN 0.4 MG SL SUBL
0.4000 mg | SUBLINGUAL_TABLET | SUBLINGUAL | Status: DC | PRN
  Administered 2022-05-25 (×3): 0.4 mg via SUBLINGUAL
  Filled 2022-05-25 (×2): qty 1

## 2022-05-25 MED ORDER — SODIUM CHLORIDE 0.9% FLUSH
10.0000 mL | Freq: Three times a day (TID) | INTRAVENOUS | Status: DC
  Administered 2022-05-25 (×3): 10 mL

## 2022-05-25 MED ORDER — IOHEXOL 350 MG/ML SOLN
65.0000 mL | Freq: Once | INTRAVENOUS | Status: AC | PRN
  Administered 2022-05-25: 65 mL via INTRAVENOUS

## 2022-05-25 MED ORDER — POTASSIUM CHLORIDE 10 MEQ/100ML IV SOLN
10.0000 meq | INTRAVENOUS | Status: AC
  Administered 2022-05-25 (×5): 10 meq via INTRAVENOUS
  Filled 2022-05-25 (×5): qty 100

## 2022-05-25 MED ORDER — MORPHINE SULFATE (PF) 2 MG/ML IV SOLN
1.0000 mg | Freq: Once | INTRAVENOUS | Status: AC
  Administered 2022-05-25: 1 mg via INTRAVENOUS
  Filled 2022-05-25: qty 1

## 2022-05-25 MED ORDER — POTASSIUM CHLORIDE 10 MEQ/100ML IV SOLN
10.0000 meq | Freq: Once | INTRAVENOUS | Status: AC
  Administered 2022-05-25: 10 meq via INTRAVENOUS
  Filled 2022-05-25: qty 100

## 2022-05-25 MED ORDER — MORPHINE SULFATE (PF) 2 MG/ML IV SOLN
1.0000 mg | INTRAVENOUS | Status: DC | PRN
  Administered 2022-05-25: 1 mg via INTRAVENOUS
  Filled 2022-05-25: qty 1

## 2022-05-25 MED ORDER — MORPHINE SULFATE (PF) 2 MG/ML IV SOLN
1.0000 mg | INTRAVENOUS | Status: DC | PRN
  Administered 2022-05-25 – 2022-05-26 (×4): 2 mg via INTRAVENOUS
  Filled 2022-05-25 (×4): qty 1

## 2022-05-25 NOTE — Progress Notes (Signed)
Referring Physician(s): Moorhead,Andrew S.  Supervising Physician: Sandi Mariscal  Patient Status:  [facility] - In-pt  Chief Complaint:  Diverticular abscess Drain placed in IR at [facility] 05/12/22 Upsized 05/23/22  Subjective:  Worsening abd pain Increase OP from drain CT  today: IMPRESSION: 1. Grossly similar abscess in the left iliac fossa with a strongly suspected in fistulous connection to the proximal sigmoid colon, as on 05/19/2022. Interval exchange of the associated percutaneous drain and involvement of the left iliopsoas muscle. 2. Cholelithiasis. 3. Chronic calcific pancreatitis. 4. Tiny left renal stone. 5.  Aortic atherosclerosis (ICD10-I70.0).  DR Donne Hazel note yesterday:  Complicated diverticultis -had a drain study per some notes that didn't show communication with colon but sure appears that way now -discussed situation with he and his daughter -will get a ct scan tonight, place on abx, review with IR in am.  Then decide on plans.  May end up needing surgery while here due to not getting better after over a month.      Allergies: Lisinopril  Medications: Prior to Admission medications   Medication Sig Start Date End Date Taking? Authorizing Provider  amLODipine (NORVASC) 5 MG tablet Take 1 tablet (5 mg total) by mouth daily. 07/08/21  Yes Burnell Blanks, MD  aspirin EC 81 MG tablet Take 1 tablet (81 mg total) by mouth daily. Swallow whole. 05/10/21  Yes Burnell Blanks, MD  budesonide-formoterol [facility]) 160-4.5 MCG/ACT inhaler Inhale 2 puffs into the lungs 2 (two) times daily as needed for wheezing or shortness of breath.   Yes [provider]  carvedilol (COREG) 12.5 MG tablet Take 12.5 mg by mouth 2 (two) times daily with a meal.   Yes [provider]  cetirizine (ZYRTEC) 10 MG tablet Take 10 mg by mouth daily. 02/01/22  Yes [provider]  glipiZIDE (GLUCOTROL) 10 MG tablet Take 1 tablet (10 mg total) by mouth  2 (two) times daily. 03/28/22  Yes Garwin Brothers, MD  rivaroxaban (XARELTO) 20 MG TABS tablet Take 20 mg by mouth daily with supper.   Yes [provider]  rosuvastatin (CRESTOR) 20 MG tablet Take 1 tablet (20 mg total) by mouth daily. 07/08/21  Yes Burnell Blanks, MD  sacubitril-valsartan (ENTRESTO) 24-26 MG Take 1 tablet by mouth 2 (two) times daily.   Yes [provider]  sertraline (ZOLOFT) 50 MG tablet Take 1 tablet (50 mg total) by mouth daily. Patient not taking: Reported on 05/24/2022 03/28/22 03/28/23  Garwin Brothers, MD  XARELTO 20 MG TABS tablet TAKE 1 TABLET BY MOUTH DAILY WITH SUPPER Patient not taking: Reported on 05/24/2022 07/26/21   Burnell Blanks, MD     Vital Signs: BP (!) 96/45 (BP Location: Right Arm)   Pulse 92   Temp 99 F (37.2 C) (Oral)   Resp 17   Ht '5\' 7"'$  (1.702 m)   Wt 155 lb 3.3 oz (70.4 kg)   SpO2 93%   BMI 24.31 kg/m   Physical Exam Skin:    General: Skin is warm.     Comments: Site of drain is clean and dry OP frank feculent material and gas Flushes easily No infection noted     Imaging: CT ABDOMEN PELVIS WO CONTRAST  Result Date: 05/25/2022 CLINICAL DATA:  Diverticulitis. EXAM: CT ABDOMEN AND PELVIS WITHOUT CONTRAST TECHNIQUE: Multidetector CT imaging of the abdomen and pelvis was performed following the standard protocol without IV contrast. RADIATION DOSE REDUCTION: This exam was performed according to the departmental dose-optimization program  which includes automated exposure control, adjustment of the mA and/or kV according to patient size and/or use of iterative reconstruction technique. COMPARISON:  05/19/2022. FINDINGS: Lower chest: Subsegmental volume loss in the dependent right lower lobe with dependent atelectasis in the left lower lobe. Heart is at the upper limits of normal in size to mildly enlarged. Decreased attenuation of the intravascular compartment is indicative of anemia. No pericardial or pleural  effusion. Distal esophagus is grossly unremarkable. Hepatobiliary: Liver is unremarkable. Stones in the gallbladder. No biliary ductal dilatation. Pancreas: Scattered calcifications in the pancreas. Otherwise unremarkable. Spleen: Negative. Adrenals/Urinary Tract: Right adrenal gland is unremarkable. Slight nodular thickening of the left adrenal gland. No specific follow-up necessary. Low-attenuation lesions in the kidneys, better seen on 05/19/2022. No specific follow-up necessary. Tiny left renal stone. Ureters are decompressed. Bladder is grossly unremarkable. Stomach/Bowel: Stomach, small bowel and majority of the colon are unremarkable. There is a fistulous tract from the posterior margin of the proximal sigmoid colon to a collection of fluid and air in the left iliac fossa (3/50), measuring approximately 5.3 x 6.6 cm (3/53), similar to 05/19/2022 when remeasured in a similar fashion. Indwelling percutaneous drain with associated increased air. Abscess is continuous with and involves the left psoas muscle, similar. Appendectomy. Vascular/Lymphatic: Atherosclerotic calcification of the aorta. No pathologically enlarged lymph nodes. Aortobifemoral bypass graft. Reproductive: Prostate is visualized. Other: No free fluid. Mesenteries and peritoneum are otherwise unremarkable. Tiny midline supraumbilical ventral hernia contains fat. Musculoskeletal: Degenerative changes in the spine. Postoperative changes in the lumbar spine. No worrisome lytic or sclerotic lesions. IMPRESSION: 1. Grossly similar abscess in the left iliac fossa with a strongly suspected in fistulous connection to the proximal sigmoid colon, as on 05/19/2022. Interval exchange of the associated percutaneous drain and involvement of the left iliopsoas muscle. 2. Cholelithiasis. 3. Chronic calcific pancreatitis. 4. Tiny left renal stone. 5.  Aortic atherosclerosis (ICD10-I70.0). Electronically Signed   By: Lorin Picket M.D.   On: 05/25/2022 08:51     Labs:  CBC: Recent Labs    03/28/22 1038 05/25/22 0712  WBC 15.1* 12.4*  HGB 12.5* 8.6*  HCT 38.5 26.0*  PLT 231 348    COAGS: Recent Labs    05/24/22 2210 05/25/22 0712  APTT 40* 42*    BMP: Recent Labs    03/28/22 1038 05/25/22 0712  NA 138 131*  K 4.0 3.2*  CL 101 95*  CO2 22 23  GLUCOSE 310* 122*  BUN 11 15  CALCIUM 9.1 7.9*  CREATININE 1.16 1.47*  GFRNONAA  --  49*    LIVER FUNCTION TESTS: Recent Labs    02/28/22 1037 03/28/22 1038 05/25/22 0712  BILITOT  --  0.5 0.7  AST '10 10 17  '$ ALT '9 9 10  '$ ALKPHOS  --  93 40  PROT  --  6.4 5.3*  ALBUMIN  --  3.2* 2.0*    Drain Location: LLQ Size:  Date of placement: 1/25-- upsize 05/23/22  Currently to: Drain collection device: gravity 24 hour output:  Output by Drain (mL) 05/23/22 0701 - 05/23/22 1900 05/23/22 1901 - 05/24/22 0700 05/24/22 0701 - 05/24/22 1900 05/24/22 1901 - 05/25/22 0700 05/25/22 0701 - 05/25/22 1100  Requested LDAs do not have output data documented.    Interval imaging/drain manipulation:  CT today  Current examination: Flushes/aspirates easily.  Insertion site unremarkable. Suture and stat lock in place. Dressed appropriately.   Plan: Continue TID flushes with 5 cc NS. Record output Q shift. Dressing changes QD or  PRN if soiled.  Call IR APP or on call IR MD if difficulty flushing or sudden change in drain output.  Repeat imaging/possible drain injection once output < 10 mL/QD (excluding flush material). Consideration for drain removal if output is < 10 mL/QD (excluding flush material), pending discussion with the providing surgical service.  Discharge planning: Please contact IR APP or on call IR MD prior to patient d/c to ensure appropriate follow up plans are in place. Typically patient will follow up with IR clinic 10-14 days post d/c for repeat imaging/possible drain injection. IR scheduler will contact patient with date/time of appointment. Patient will need to flush  drain QD with 5 cc NS, record output QD, dressing changes every 2-3 days or earlier if soiled.   IR will continue to follow - please call with questions or concerns.  Assessment and Plan:  Diverticular abscess drain OP feculent Increased OP- increased pain Plan per CCS Flush TID for now   Electronically Signed: Lavonia Drafts, PA-C 05/25/2022, 11:00 AM   I spent a total of 15 Minutes at the the patient's bedside AND on the patient's hospital floor or unit, greater than 50% of which was counseling/coordinating care for LLQ absc drain    Patient ID: [name], male   DOB: 27-May-1945, 77 y.o.   MRN: [id number]

## 2022-05-25 NOTE — Hospital Course (Addendum)
77 y.o.m w/  PAF, CAD s/p stent, ICM with EF of 25% as of Nov 2023, PAD s/p aorto bifembypass in 1992 and fem-fem bypass (maybe with fem-pop) by Dr. Donnetta Hutching in 2011, HTN, DM2, COPD quit smoking in 2022  Pt with diverticulitis with abscess, initially on Apr 08, 2022.  Had IR drain placed.Seemed to be doing better for month of Jan, drainage had basically stopped.  But then sudden worsening of abd pain and increased drain output saw him admitted to [facility] on 2/1.  IR repositioned drain on 2/2 but had ongoing feculent output despite this and ABx.  Ultimately surgery offered on 2/6 but family wanted him transferred for the surgery to larger hospital.  Pt transferred to [facility]. Patient underwent exploratory  laparotomy with Hartman's procedure for complicated diverticulitis with coloatmospheric Fistula by Dr Bobbye Morton 2/08. Skilled nursing facility was denied BY INSURANCE. Surgery saw the patient 2/17 staples removed and started wet-to-dry dressing, midline Penrose drain was removed  2/15.Okay for discharge. CCS and TOC okay with  home with home health for wet-to-dry midline and ostomy care or insurance appeal for SNF. Snf denied- he was cleared for dc by ccs and daughter okay with it he will be going to sister's house TOC setting up [facility]

## 2022-05-25 NOTE — Progress Notes (Addendum)
ANTICOAGULATION CONSULT NOTE  Pharmacy Consult for Heparin Indication: atrial fibrillation  Allergies  Allergen Reactions   Lisinopril Swelling and Rash    Rash - face and tounge swell    Patient Measurements: Height: '5\' 7"'$  (170.2 cm) Weight: 70.4 kg (155 lb 3.3 oz) IBW/kg (Calculated) : 66.1 Heparin Dosing Weight: 70 kg  Vital Signs: Temp: 98 F (36.7 C) (02/07 1648) Temp Source: Oral (02/07 0902) BP: 109/55 (02/07 1715) Pulse Rate: 91 (02/07 1715)  Labs: Recent Labs    05/24/22 2210 05/25/22 0712 05/25/22 1631  HGB  --  8.6* 8.8*  HCT  --  26.0* 27.4*  PLT  --  348 411*  APTT 40* 42* 45*  HEPARINUNFRC 1.02* 0.63 0.34  CREATININE  --  1.47*  --      Estimated Creatinine Clearance: 39.3 mL/min (A) (by C-G formula based on SCr of 1.47 mg/dL (H)).   Medical History: Past Medical History:  Diagnosis Date   Atherosclerosis of native arteries of the extremities with intermittent claudication 08/13/2013   Bladder cancer [facility])    resection x3   CAD, NATIVE VESSEL 09/22/2008   Qualifier: Diagnosis of  By: Jaramillo, [place], ISCHEMIC 09/22/2008   Qualifier: Diagnosis of  By: Sidney Ace     Chronic systolic CHF (congestive heart failure) ([place])    Cigarette smoker    Coronary artery disease    status post DMI RX Taxus stent RCA 2006 with susequent Stent LAD and subsequent  stent thrombosis RCA unable to be opened 2006 -neg mv 10/2008, 10/16/17 ISR to pLAD with PTCA/DES, CTO of RCA with collaterals, EF 25%   Coronary artery disease involving native coronary artery of native heart with unstable angina pectoris ([place])    Diverticulitis of sigmoid colon 05/11/2021   HYPERLIPIDEMIA-MIXED 09/22/2008   Qualifier: Diagnosis of  By: Jaramillo, [place], BENIGN 09/22/2008   Qualifier: Diagnosis of  By: Sidney Ace     Ischemic cardiomyopathy    ejection fraction of 40-45%   Lumbar spinal stenosis    Myocardial infarction ([place])    "I've had 4"  (10/16/2017)   NSVT (nonsustained ventricular tachycardia) ([facility])    Paroxysmal atrial fibrillation ([facility])    PVC's (premature ventricular contractions)    PVD 09/22/2008   Qualifier: Diagnosis of  By: Sidney Ace     Status post coronary artery stent placement    SYSTOLIC HEART FAILURE, CHRONIC 09/22/2008   Qualifier: Diagnosis of  By: Jaramillo, [place] 05/29/2009   Qualifier: Diagnosis of  By: Earley Favor RN, BSN, Patricia L    Type II diabetes mellitus ([place])    Unstable angina ([place]) 10/16/2017     Assessment: 42 YOM presenting with diverticulitis and abscess of the intestine. On Xarelto PTA for Afib; however, recently ran out. Last dose 2/4 at outside hospital. Pharmacy consulted to dose heparin while determining surgery plans.  aPTT this evening remains subtherapeutic despite rate adjustment, heparin level appears closer to correlating as DOAC clears. H/H stable on evening check.     Goal of Therapy:  Heparin level 0.3-0.7 units/ml aPTT 66-102 seconds Monitor platelets by anticoagulation protocol: Yes   Plan:  Increase IV heparin to 1550 units/h Recheck heparin level and aPTT in am  Arrie Senate, PharmD, BCPS, [facility] Clinical Pharmacist [phone] Please check AMION for all [place] numbers 05/25/2022

## 2022-05-25 NOTE — Progress Notes (Signed)
General Surgery Follow Up Note  Subjective:    Overnight Issues:   Objective:  Vital signs for last 24 hours: Temp:  [98.5 F (36.9 C)-99.5 F (37.5 C)] 99 F (37.2 C) (02/07 0902) Pulse Rate:  [46-110] 92 (02/07 0902) Resp:  [17-18] 17 (02/07 0902) BP: (96-134)/(45-58) 96/45 (02/07 0902) SpO2:  [93 %] 93 % (02/07 0902) Weight:  [70.4 kg] 70.4 kg (02/06 2000)  Hemodynamic parameters for last 24 hours:    Intake/Output from previous day: 02/06 0701 - 02/07 0700 In: 68.7 [I.V.:46.2; IV Piggyback:22.5] Out: 300 [Urine:300]  Intake/Output this shift: Total I/O In: 10 [Other:10] Out: 325 [Urine:200; Drains:125]  Vent settings for last 24 hours:    Physical Exam:  Gen: comfortable, no distress Neuro: non-focal exam HEENT: PERRL Neck: supple CV: RRR Pulm: unlabored breathing Abd: soft, diffusely TTP, feculent drainage GU: clear yellow urine Extr: wwp, no edema   Results for orders placed or performed during the hospital encounter of 05/24/22 (from the past 24 hour(s))  MRSA Next Gen by PCR, Nasal     Status: None   Collection Time: 05/24/22  7:59 PM   Specimen: Nasal Mucosa; Nasal Swab  Result Value Ref Range   MRSA by PCR Next Gen NOT DETECTED NOT DETECTED  Glucose, capillary     Status: Abnormal   Collection Time: 05/24/22  9:20 PM  Result Value Ref Range   Glucose-Capillary 149 (H) 70 - 99 mg/dL  Heparin level (unfractionated)     Status: Abnormal   Collection Time: 05/24/22 10:10 PM  Result Value Ref Range   Heparin Unfractionated 1.02 (H) 0.30 - 0.70 IU/mL  TSH     Status: Abnormal   Collection Time: 05/24/22 10:10 PM  Result Value Ref Range   TSH 6.794 (H) 0.350 - 4.500 uIU/mL  T4, free     Status: Abnormal   Collection Time: 05/24/22 10:10 PM  Result Value Ref Range   Free T4 1.31 (H) 0.61 - 1.12 ng/dL  APTT     Status: Abnormal   Collection Time: 05/24/22 10:10 PM  Result Value Ref Range   aPTT 40 (H) 24 - 36 seconds  Glucose, capillary      Status: Abnormal   Collection Time: 05/25/22 12:02 AM  Result Value Ref Range   Glucose-Capillary 138 (H) 70 - 99 mg/dL  Glucose, capillary     Status: Abnormal   Collection Time: 05/25/22 12:35 AM  Result Value Ref Range   Glucose-Capillary 142 (H) 70 - 99 mg/dL  Glucose, capillary     Status: Abnormal   Collection Time: 05/25/22  4:53 AM  Result Value Ref Range   Glucose-Capillary 143 (H) 70 - 99 mg/dL  CBC     Status: Abnormal   Collection Time: 05/25/22  7:12 AM  Result Value Ref Range   WBC 12.4 (H) 4.0 - 10.5 K/uL   RBC 3.10 (L) 4.22 - 5.81 MIL/uL   Hemoglobin 8.6 (L) 13.0 - 17.0 g/dL   HCT 26.0 (L) 39.0 - 52.0 %   MCV 83.9 80.0 - 100.0 fL   MCH 27.7 26.0 - 34.0 pg   MCHC 33.1 30.0 - 36.0 g/dL   RDW 17.7 (H) 11.5 - 15.5 %   Platelets 348 150 - 400 K/uL   nRBC 0.0 0.0 - 0.2 %  Comprehensive metabolic panel     Status: Abnormal   Collection Time: 05/25/22  7:12 AM  Result Value Ref Range   Sodium 131 (L) 135 - 145 mmol/L  Potassium 3.2 (L) 3.5 - 5.1 mmol/L   Chloride 95 (L) 98 - 111 mmol/L   CO2 23 22 - 32 mmol/L   Glucose, Bld 122 (H) 70 - 99 mg/dL   BUN 15 8 - 23 mg/dL   Creatinine, Ser 1.47 (H) 0.61 - 1.24 mg/dL   Calcium 7.9 (L) 8.9 - 10.3 mg/dL   Total Protein 5.3 (L) 6.5 - 8.1 g/dL   Albumin 2.0 (L) 3.5 - 5.0 g/dL   AST 17 15 - 41 U/L   ALT 10 0 - 44 U/L   Alkaline Phosphatase 40 38 - 126 U/L   Total Bilirubin 0.7 0.3 - 1.2 mg/dL   GFR, Estimated 49 (L) >60 mL/min   Anion gap 13 5 - 15  Heparin level (unfractionated)     Status: None   Collection Time: 05/25/22  7:12 AM  Result Value Ref Range   Heparin Unfractionated 0.63 0.30 - 0.70 IU/mL  APTT     Status: Abnormal   Collection Time: 05/25/22  7:12 AM  Result Value Ref Range   aPTT 42 (H) 24 - 36 seconds  Glucose, capillary     Status: Abnormal   Collection Time: 05/25/22  7:22 AM  Result Value Ref Range   Glucose-Capillary 115 (H) 70 - 99 mg/dL  Glucose, capillary     Status: Abnormal    Collection Time: 05/25/22 11:21 AM  Result Value Ref Range   Glucose-Capillary 128 (H) 70 - 99 mg/dL    Assessment & Plan:  Present on Admission:  Diverticulitis of large intestine with abscess  AKI (acute kidney injury) ([place])  SYSTOLIC HEART FAILURE, CHRONIC  Paroxysmal atrial fibrillation ([place])  Hypothyroidism    LOS: 1 day   Additional comments:I reviewed the patient's new clinical lab test results.   and I reviewed the patients new imaging test results.    Complicated diverticulitis with coloatmospheric fistula - persistent and worsening abdominal pain. Offered patient option of exlap partial colectomy and colostomy and he desires to proceed. High risk surgical candidate with h/o fem-fem bypass followed by AoBifem, DM, HTN, and HF with EF 25%. ACS risk calculator (below) reviewed in detail with patient and daughter at bedside. Recommend cardiac risk stratification and optimization pre-op with plan for OR 2/8. Primary team notified. NPO at midnight,  DVT - SCDs, LMWH Dispo - med-surg       Jesusita Oka, MD Trauma & General Surgery Please use [email or web address] to contact on call provider  05/25/2022  *Care during the described time interval was provided by me. I have reviewed this patient's available data, including medical history, events of note, physical examination and test results as part of my evaluation.

## 2022-05-25 NOTE — Consult Note (Addendum)
Cardiology Consultation   Patient ID: PAWEL Nguyen MRN: [id number]; DOB: [birth year]  Admit date: 05/24/2022 Date of Consult: 05/25/2022  PCP:  [name], [place] Providers Cardiologist:  Dennis Campus, MD   Patient Profile:   [name] is a 77 y.o. male with a hx of PAF, chronic anticoagulation, COPD, chronic systolic heart failure/ICM, HLD, HTN, DM2, ongoing tobacco abuse, and PAD who is being seen 05/25/2022 for the evaluation of preoperative risk evaluation at the request of [name].  History of Present Illness:   [name] has been followed by [facility] for PAD. He has had bladder cancer and multiple surgeries for this. Carotid dopplers 2021 with mild bilateral disease. He has a significant CAD history with inferior MI in 1992 with PTCA to RCA, anterior MI 2000 treated with stent to LAD, and inferior MI 2006 with DES-RCA, LHC later in 2006 with ISR in the RCA stent. Echocardiogram 2017 with stable mild LV dysfunction, 35-40% to 40-45%. Nuclear stress test Sept 2017 negative for ischemia but EF 23%.  He was seen in the office 02/2016 for dizziness and weakness found to be in Afib with SVR. BB changed to coreg and xarelto started for [facility]. Dizziness resolved when lasix stopped. He was seen back in the office 2019 with dyspnea and fatigue, anorexia and 20 lb weight loss. Echo with LVEF 30% and WMA leading to nuclear stress test that appeared "identical" to stress test in 2017 with large scar in the inferior and apical walls with EF 29%. Eventually had repeat heart cath 10/2017 with severe stenosis in the proximal LAD treated with DES. RCA was chronically occluded with L-R collaterals. Echo 01/2018 with LVEF 25-30%. He has since been maintained on 81 mg ASA. He declined EP referral to discuss ICD in 2019.    He transitioned care Nguyen Dr. Angelena Nguyen to Dr. Agustin Nguyen and was last seen in clinic 02/10/22. He was doing well and trying to walk at that time.   Last echo 02/2022 with  LVEF 25% with grade II DD, mildly reduced RV, mild MR.   He has been followed [facility] Jan/Feb 2024 for hospital follow up after acute sigmoid diverticulitis with perforation and abscess formation. This was treated with percutaneous drain placement. CT imaging showed resolution of abscess, drain was continued. Unfortunately, he became acutely ill, as discussed at last visit 05/19/22. He underwent repeat imaging and was transferred from [facility] to [facility] for acute worsening drain output. Surgery was offered but patient and family requested transfer to [facility].   Last does of xarelto was 05/22/22 at [facility].   Cardiology was consulted for risk management and optimization prior to surgery. General surgery tentatively planning resection tomorrow.   sCr 1.47 K 3.2 TSH 6.794 - this is a new elevation compared to 1 month ago T4 1.31 Hb 12.5 --> 8.6 Albumin 2.0  During my interview, his daughter was bedside and helped with history. He does report that his abdominal pain is spreading to his chest. He recounts the chest pain he had during his prior MI's and this is similar to one event. He does not appear volume up and is unaware of when he is in Afib. He denies shortness of breath. He reports being able to walk through [employer]. He walks around his yard but does not climb a flight of stairs. He does some weed eating at home. He can complete moderate house work (floors). He denies chest pain with these activities,  but does have DOE that limits activity.   Past Medical History:  Diagnosis Date   Atherosclerosis of native arteries of the extremities with intermittent claudication 08/13/2013   Bladder cancer [facility])    resection x3   CAD, NATIVE VESSEL 09/22/2008   Qualifier: Diagnosis of  By: [name], [place], ISCHEMIC 09/22/2008   Qualifier: Diagnosis of  By: [name]     Chronic systolic CHF (congestive heart failure) ([place])    Cigarette smoker    Coronary artery disease     status post DMI RX Taxus stent RCA 2006 with susequent Stent LAD and subsequent  stent thrombosis RCA unable to be opened 2006 -neg mv 10/2008, 10/16/17 ISR to pLAD with PTCA/DES, CTO of RCA with collaterals, EF 25%   Coronary artery disease involving native coronary artery of native heart with unstable angina pectoris ([place])    Diverticulitis of sigmoid colon 05/11/2021   HYPERLIPIDEMIA-MIXED 09/22/2008   Qualifier: Diagnosis of  By: [name], [place], BENIGN 09/22/2008   Qualifier: Diagnosis of  By: [name]     Ischemic cardiomyopathy    ejection fraction of 40-45%   Lumbar spinal stenosis    Myocardial infarction ([place])    "I've had 4" (10/16/2017)   NSVT (nonsustained ventricular tachycardia) (HCC)    Paroxysmal atrial fibrillation (HCC)    PVC's (premature ventricular contractions)    PVD 09/22/2008   Qualifier: Diagnosis of  By: [name]     Status post coronary artery stent placement    SYSTOLIC HEART FAILURE, CHRONIC 09/22/2008   Qualifier: Diagnosis of  By: [name], [place] 05/29/2009   Qualifier: Diagnosis of  By: Earley Favor, RN, BSN, Patricia L    Type II diabetes mellitus ([place])    Unstable angina ([place]) 10/16/2017    Past Surgical History:  Procedure Laterality Date   AORTA - BILATERAL FEMORAL ARTERY BYPASS GRAFT Bilateral 1992   APPENDECTOMY     BACK SURGERY     CORONARY ANGIOPLASTY WITH STENT PLACEMENT     taxus stent  placed into his right coronary artery; stent placed to the LAD.     CORONARY STENT INTERVENTION N/A 10/16/2017   Procedure: CORONARY STENT INTERVENTION;  Surgeon: Burnell Blanks, MD;  Location: [facility];  Service: Cardiovascular;  Laterality: N/A;   FEMORAL-FEMORAL BYPASS GRAFT  07/2009   LUMBAR MICRODISCECTOMY  12/31/08   RIGHT/LEFT HEART CATH AND CORONARY ANGIOGRAPHY N/A 10/16/2017   Procedure: RIGHT/LEFT HEART CATH AND CORONARY ANGIOGRAPHY;  Surgeon: Burnell Blanks, MD;  Location: [facility];   Service: Cardiovascular;  Laterality: N/A;   SHOULDER OPEN ROTATOR CUFF REPAIR Left      Home Medications:  Prior to Admission medications   Medication Sig Start Date End Date Taking? Authorizing Provider  amLODipine (NORVASC) 5 MG tablet Take 1 tablet (5 mg total) by mouth daily. 07/08/21  Yes Burnell Blanks, MD  aspirin EC 81 MG tablet Take 1 tablet (81 mg total) by mouth daily. Swallow whole. 05/10/21  Yes Burnell Blanks, MD  budesonide-formoterol [facility]) 160-4.5 MCG/ACT inhaler Inhale 2 puffs into the lungs 2 (two) times daily as needed for wheezing or shortness of breath.   Yes [provider]  carvedilol (COREG) 12.5 MG tablet Take 12.5 mg by mouth 2 (two) times daily with a meal.   Yes [provider]  cetirizine (ZYRTEC) 10 MG tablet Take 10 mg by mouth daily. 02/01/22  Yes [provider]  glipiZIDE (GLUCOTROL) 10 MG tablet Take 1 tablet (10 mg total) by mouth 2 (two) times daily. 03/28/22  Yes Dennis Brothers, MD  rivaroxaban (XARELTO) 20 MG TABS tablet Take 20 mg by mouth daily with supper.   Yes [provider]  rosuvastatin (CRESTOR) 20 MG tablet Take 1 tablet (20 mg total) by mouth daily. 07/08/21  Yes Burnell Blanks, MD  sacubitril-valsartan (ENTRESTO) 24-26 MG Take 1 tablet by mouth 2 (two) times daily.   Yes [provider]  sertraline (ZOLOFT) 50 MG tablet Take 1 tablet (50 mg total) by mouth daily. Patient not taking: Reported on 05/24/2022 03/28/22 03/28/23  Dennis Brothers, MD  XARELTO 20 MG TABS tablet TAKE 1 TABLET BY MOUTH DAILY WITH SUPPER Patient not taking: Reported on 05/24/2022 07/26/21   Burnell Blanks, MD    Inpatient Medications: Scheduled Meds:  carvedilol  12.5 mg Oral BID WC   insulin aspart  0-9 Units Subcutaneous Q4H   insulin glargine-yfgn  5 Units Subcutaneous QHS   sodium chloride flush  10 mL Intracatheter Q8H   Continuous Infusions:  heparin 1,350 Units/hr (05/25/22 1547)    lactated ringers 75 mL/hr at 05/25/22 1547   piperacillin-tazobactam (ZOSYN)  IV Stopped (05/25/22 1318)   potassium chloride     PRN Meds: acetaminophen **OR** acetaminophen, melatonin, morphine injection, ondansetron **OR** ondansetron (ZOFRAN) IV  Allergies:    Allergies  Allergen Reactions   Lisinopril Swelling and Rash    Rash - face and tounge swell    Social History:   Social History   Socioeconomic History   Marital status: Married    Spouse name: Not on file   Number of children: 1   Years of education: Not on file   Highest education level: Not on file  Occupational History   Occupation: retired    Fish farm manager: RETIRED    Comment: truck driver  Tobacco Use   Smoking status: Former    Packs/day: 0.50    Years: 56.00    Total pack years: 28.00    Types: Cigars, Cigarettes    Quit date: 2022    Years since quitting: 2.1   Smokeless tobacco: Former    Types: Nurse, children's Use: Never used  Substance and Sexual Activity   Alcohol use: Yes    Comment: 10/16/2017 "1 beer q 2-3 months"   Drug use: Never   Sexual activity: Not Currently  Other Topics Concern   Not on file  Social History Narrative   Lives with girlfriend in [place].     Social Determinants of Health   Financial Resource Strain: Not on file  Food Insecurity: No Food Insecurity (05/24/2022)   Hunger Vital Sign    Worried About Running Out of Food in the Last Year: Never true    Ran Out of Food in the Last Year: Never true  Transportation Needs: No Transportation Needs (05/24/2022)   PRAPARE - Hydrologist (Medical): No    Lack of Transportation (Non-Medical): No  Physical Activity: Not on file  Stress: Not on file  Social Connections: Not on file  Intimate Partner Violence: Not on file    Family History:    Family History  Problem Relation Age of Onset   Coronary artery disease Unknown    Diabetes Unknown    Cardiomyopathy Mother    Heart disease  Mother    Hyperlipidemia Mother    Coronary artery disease  Father    Stroke Father    Diabetes Father    Heart disease Father        before age 48   Hyperlipidemia Father    Heart attack Father    Diabetes Sister    Heart disease Sister        before age 21   Hyperlipidemia Sister    Heart attack Sister      ROS:  Please see the history of present illness.   All other ROS reviewed and negative.     Physical Exam/Data:   Vitals:   05/24/22 1952 05/24/22 2000 05/25/22 0452 05/25/22 0902  BP:   (!) 133/55 (!) 96/45  Pulse:   (!) 110 92  Resp:    17  Temp:   99.5 F (37.5 C) 99 F (37.2 C)  TempSrc:   Oral Oral  SpO2:   93% 93%  Weight: 70.4 kg 70.4 kg    Height:  '5\' 7"'$  (1.702 m)      Intake/Output Summary (Last 24 hours) at 05/25/2022 1605 Last data filed at 05/25/2022 1547 Gross per 24 hour  Intake 922.24 ml  Output 625 ml  Net 297.24 ml      05/24/2022    8:00 PM 05/24/2022    7:52 PM 04/22/2022   10:12 AM  Last 3 Weights  Weight (lbs) 155 lb 3.3 oz 155 lb 3.3 oz 157 lb 2 oz  Weight (kg) 70.4 kg 70.4 kg 71.271 kg     Body mass index is 24.31 kg/m.  General:  Well nourished, well developed, in no acute distress HEENT: normal Neck: no JVD Vascular: No carotid bruits; Distal pulses 2+ bilaterally Cardiac:  normal S1, S2; RRR; no murmur  Lungs:  clear to auscultation bilaterally, no wheezing, rhonchi or rales  Abd: soft, nontender, no hepatomegaly  Ext: no edema Musculoskeletal:  No deformities, BUE and BLE strength normal and equal Skin: warm and dry  Neuro:  CNs 2-12 intact, no focal abnormalities noted Psych:  Normal affect   EKG:  The EKG was personally reviewed and demonstrates:  pending Telemetry:  Telemetry was personally reviewed and demonstrates:  appears either sinus rhythm with low voltage p waves with PVCs vs junctional rhythm, rhythm is regular with ectopy  Relevant CV Studies:  Echo 02/2022: 1. Left ventricular ejection fraction, by estimation,  is 60 to 65%. Left  ventricular ejection fraction by PLAX is 25 %. The left ventricle has  severely decreased function. The left ventricle demonstrates global  hypokinesis. The left ventricular internal   cavity size was mildly dilated. Left ventricular diastolic parameters are  consistent with Grade II diastolic dysfunction (pseudonormalization).  Elevated left atrial pressure. The average left ventricular global  longitudinal strain is -4.9 %. The global  longitudinal strain is abnormal.   2. Right ventricular systolic function is mildly reduced. The right  ventricular size is normal. There is normal pulmonary artery systolic  pressure.   3. Left atrial size was mildly dilated.   4. The mitral valve is degenerative. Mild mitral valve regurgitation. No  evidence of mitral stenosis.   5. The aortic valve is normal in structure. Aortic valve regurgitation is  not visualized. No aortic stenosis is present.   6. Aortic Normal DTA.   7. The inferior vena cava is normal in size with greater than 50%  respiratory variability, suggesting right atrial pressure of 3 mmHg.   Laboratory Data:  High Sensitivity Troponin:  No results for input(s): "TROPONINIHS" in the  last 720 hours.   Chemistry Recent Labs  Lab 05/25/22 0712  NA 131*  K 3.2*  CL 95*  CO2 23  GLUCOSE 122*  BUN 15  CREATININE 1.47*  CALCIUM 7.9*  GFRNONAA 49*  ANIONGAP 13    Recent Labs  Lab 05/25/22 0712  PROT 5.3*  ALBUMIN 2.0*  AST 17  ALT 10  ALKPHOS 40  BILITOT 0.7   Lipids No results for input(s): "CHOL", "TRIG", "HDL", "LABVLDL", "LDLCALC", "CHOLHDL" in the last 168 hours.  Hematology Recent Labs  Lab 05/25/22 0712  WBC 12.4*  RBC 3.10*  HGB 8.6*  HCT 26.0*  MCV 83.9  MCH 27.7  MCHC 33.1  RDW 17.7*  PLT 348   Thyroid  Recent Labs  Lab 05/24/22 2210  TSH 6.794*  FREET4 1.31*    BNPNo results for input(s): "BNP", "PROBNP" in the last 168 hours.  DDimer No results for input(s): "DDIMER"  in the last 168 hours.   Radiology/Studies:  CT ABDOMEN PELVIS WO CONTRAST  Result Date: 05/25/2022 CLINICAL DATA:  Diverticulitis. EXAM: CT ABDOMEN AND PELVIS WITHOUT CONTRAST TECHNIQUE: Multidetector CT imaging of the abdomen and pelvis was performed following the standard protocol without IV contrast. RADIATION DOSE REDUCTION: This exam was performed according to the departmental dose-optimization program which includes automated exposure control, adjustment of the mA and/or kV according to patient size and/or use of iterative reconstruction technique. COMPARISON:  05/19/2022. FINDINGS: Lower chest: Subsegmental volume loss in the dependent right lower lobe with dependent atelectasis in the left lower lobe. Heart is at the upper limits of normal in size to mildly enlarged. Decreased attenuation of the intravascular compartment is indicative of anemia. No pericardial or pleural effusion. Distal esophagus is grossly unremarkable. Hepatobiliary: Liver is unremarkable. Stones in the gallbladder. No biliary ductal dilatation. Pancreas: Scattered calcifications in the pancreas. Otherwise unremarkable. Spleen: Negative. Adrenals/Urinary Tract: Right adrenal gland is unremarkable. Slight nodular thickening of the left adrenal gland. No specific follow-up necessary. Low-attenuation lesions in the kidneys, better seen on 05/19/2022. No specific follow-up necessary. Tiny left renal stone. Ureters are decompressed. Bladder is grossly unremarkable. Stomach/Bowel: Stomach, small bowel and majority of the colon are unremarkable. There is a fistulous tract from the posterior margin of the proximal sigmoid colon to a collection of fluid and air in the left iliac fossa (3/50), measuring approximately 5.3 x 6.6 cm (3/53), similar to 05/19/2022 when remeasured in a similar fashion. Indwelling percutaneous drain with associated increased air. Abscess is continuous with and involves the left psoas muscle, similar. Appendectomy.  Vascular/Lymphatic: Atherosclerotic calcification of the aorta. No pathologically enlarged lymph nodes. Aortobifemoral bypass graft. Reproductive: Prostate is visualized. Other: No free fluid. Mesenteries and peritoneum are otherwise unremarkable. Tiny midline supraumbilical ventral hernia contains fat. Musculoskeletal: Degenerative changes in the spine. Postoperative changes in the lumbar spine. No worrisome lytic or sclerotic lesions. IMPRESSION: 1. Grossly similar abscess in the left iliac fossa with a strongly suspected in fistulous connection to the proximal sigmoid colon, as on 05/19/2022. Interval exchange of the associated percutaneous drain and involvement of the left iliopsoas muscle. 2. Cholelithiasis. 3. Chronic calcific pancreatitis. 4. Tiny left renal stone. 5.  Aortic atherosclerosis (ICD10-I70.0). Electronically Signed   By: Lorin Picket M.D.   On: 05/25/2022 08:51     Assessment and Plan:   Paroxysmal atrial fibrillation Will obtain EKG - pending Telemetry shows regular rhythm with PVCs that is either junctional or low voltage p waves.  Has been maintained on 12.5 mg coreg Continue telemetry  this admission Keep K closer to 4 and Mg above 2 Defer to primary regarding thyroid studies - may complicate arrhythmia   Chronic anticoagulation Per notes, patient rand out of xarelto, has been started on heparin gtt Sounds like patient didn't fill xarelto ?4 weeks. Unclear if this was an affordability issue. Can involve SW for help with this after surgery.  This patients CHA2DS2-VASc Score and unadjusted Ischemic Stroke Rate (% per year) is equal to 9.7 % stroke rate/year from a score of 6 (CAD, CHF,, HTN, DM, 2age) - I reviewed why he was on xarelto, seemed to be some confusion regarding ASA monotherapy   Chronic systolic heart failure Ischemic cardiomyopathy  Hypertension Known LVEF 95% with diastolic dysfunction. GDMT: coreg, entresto Has declined EP evaluation for ICD in the  past Does not appear volume up on exam Would recommend minimal IVF during surgery with PRN lasix after   CAD Prior PCIs with known occlusion of RCA with collaterals Prior ISR Will need to clarify antiplatelet regimen prior to discharge [facility] meds on [facility] note listed ASA, plavix, and xarelto Will likely only need to discharge on ASA and xarelto, defer to surgeon on timing Pt reports chest pain that is radiating from his abdomen. Given disease, demand ischemia likely. He is already on heparin and BB.  EKG as above.   PAD Hx of aortobifemoral bypass No claudication - has done well after last intervention   AKI sCr 1.47 ARB/ARNI held   Preoperative risk evaluation for MACE Given his history of CAD and CHF, he has a 6.6% risk of MACE during a perioperative period. If procedure is open abdominal, risk reaches 11%. We had a long discussion regarding his risk for surgery. It sounds like he is in significant pain and has failed percutaneous drain. He is having open conversations with his daughter and providers regarding code status. He reports activity equivalent to 4.0 METS, limited by DOE. He was not having chest pain prior to his abdominal abscess/diverticulitis. Likelihood of converting to Afib with RVR is high given his surgery - would manage with BB, avoid cardizem. Unclear if thyroid studies are due to inflammation/stress - would likely avoid amiodarone unless hypotensive with RVR. He has missed doses of [place], so would not be a DCCV candidate without TEE. Cautious with IVF perioperatively, may need targeted doses of lasix following surgery.  Obtain EKG to confirm no ischemic changes. Pt and daughter understand his risk and will continue discussion with surgeon, but will likely proceed with surgery.     Risk Assessment/Risk Scores:        For questions or updates, please contact [place] Please consult [email or web address] for contact info under    Signed, Ledora Bottcher,  PA  05/25/2022 4:05 PM

## 2022-05-25 NOTE — Progress Notes (Signed)
ANTICOAGULATION CONSULT NOTE -Follow up  Pharmacy Consult for Heparin Indication: atrial fibrillation  Allergies  Allergen Reactions   Lisinopril Swelling and Rash    Rash - face and tounge swell    Patient Measurements: Height: '5\' 7"'$  (170.2 cm) Weight: 70.4 kg (155 lb 3.3 oz) IBW/kg (Calculated) : 66.1 Heparin Dosing Weight: 70 kg  Vital Signs: Temp: 99.5 F (37.5 C) (02/07 0452) Temp Source: Oral (02/07 0452) BP: 133/55 (02/07 0452) Pulse Rate: 110 (02/07 0452)  Labs: Recent Labs    05/24/22 2210 05/25/22 0712  HGB  --  8.6*  HCT  --  26.0*  PLT  --  348  APTT 40* 42*  HEPARINUNFRC 1.02* 0.63  CREATININE  --  1.47*    Estimated Creatinine Clearance: 39.3 mL/min (A) (by C-G formula based on SCr of 1.47 mg/dL (H)).   Medical History: Past Medical History:  Diagnosis Date   Atherosclerosis of native arteries of the extremities with intermittent claudication 08/13/2013   Bladder cancer [facility])    resection x3   CAD, NATIVE VESSEL 09/22/2008   Qualifier: Diagnosis of  By: Jaramillo, [place], ISCHEMIC 09/22/2008   Qualifier: Diagnosis of  By: Sidney Ace     Chronic systolic CHF (congestive heart failure) ([place])    Cigarette smoker    Coronary artery disease    status post DMI RX Taxus stent RCA 2006 with susequent Stent LAD and subsequent  stent thrombosis RCA unable to be opened 2006 -neg mv 10/2008, 10/16/17 ISR to pLAD with PTCA/DES, CTO of RCA with collaterals, EF 25%   Coronary artery disease involving native coronary artery of native heart with unstable angina pectoris ([place])    Diverticulitis of sigmoid colon 05/11/2021   HYPERLIPIDEMIA-MIXED 09/22/2008   Qualifier: Diagnosis of  By: Jaramillo, [place], BENIGN 09/22/2008   Qualifier: Diagnosis of  By: Sidney Ace     Ischemic cardiomyopathy    ejection fraction of 40-45%   Lumbar spinal stenosis    Myocardial infarction ([place])    "I've had 4" (10/16/2017)   NSVT (nonsustained  ventricular tachycardia) (HCC)    Paroxysmal atrial fibrillation (HCC)    PVC's (premature ventricular contractions)    PVD 09/22/2008   Qualifier: Diagnosis of  By: Sidney Ace     Status post coronary artery stent placement    SYSTOLIC HEART FAILURE, CHRONIC 09/22/2008   Qualifier: Diagnosis of  By: Jaramillo, [place] 05/29/2009   Qualifier: Diagnosis of  By: Earley Favor RN, BSN, Patricia L    Type II diabetes mellitus ([place])    Unstable angina ([place]) 10/16/2017     Assessment: 32 YOM presenting with diverticulitis and abscess of the intestine. On Xarelto PTA for Afib; however, recently ran out. Last dose 2/4 at outside hospital. Pharmacy consulted to dose heparin while determining surgery plans.  Baseline HL 1.02 (elevated 2/2 xarelto),  Ptt 40 sec prior to start of IV heparin.  Dose based on aPTT until heparin levels are correlating.   8 hour aPTT = 42 sec,  HL 0.63,  on heparin 1150 units/hr. Slight increase in aPTT and slight decrease in heparin level from baseline labs.  HLs possibly starting to correlate with aPTT, as questionable compliance with xarelto was noted. Last dose of xarelto noted 2/4 as OSH. Hgb 8.6 , pltc 348 this morning.   No bleeding and no issues/interruptions with heparin infusion reported per RN this AM.  Goal of Therapy:  Heparin level 0.3-0.7 units/ml aPTT 66-102 seconds Monitor platelets by anticoagulation protocol: Yes   Plan:  Increase IV heparin to 1350 units/hr Check 8hr aPTT, HL and CBC. Daily aPTT, HL, CBC  Monitor for signs/symptoms of bleeding    Thank you for allowing pharmacy to be part of this patients care team. Nicole Cella, RPh Clinical Pharmacist  Clinical Pharmacist 05/25/2022,8:21 AM

## 2022-05-25 NOTE — Progress Notes (Signed)
Progress Note   Patient: [name] HBZ:[id number] DOB: Mar 08, 1946 DOA: 05/24/2022     1 DOS: the patient was seen and examined on 05/25/2022   Brief hospital course: 77 y.o. male with medical history significant of PAF, CAD s/p stent, ICM with EF of 25% as of Nov 2023, PAD s/p aorto bifembypass in 1992 and fem-fem bypass (maybe with fem-pop) by Dr. Donnetta Hutching in 2011, HTN, DM2, COPD quit smoking in 2022.   Pt with diverticulitis with abscess, initially on Apr 08, 2022.  Had IR drain placed.  Seemed to be doing better for month of Jan, drainage had basically stopped.  But then sudden worsening of abd pain and increased drain output saw him admitted to [facility] on 2/1.  IR repositioned drain on 2/2 but had ongoing feculent output despite this and ABx.  Ultimately surgery offered on 2/6 but family wanted him transferred for the surgery to larger hospital.  Pt transferred to [facility]  Assessment and Plan: * Diverticulitis of large intestine with abscess Failed medical treatment and IR drainage attempts x2 with ongoing feculant output around drain, 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 Surgery offered at [facility], however family had requested transfer to larger hospital, thus pt was transferred to [facility] -General Surgery following with plan for surgery 2/8 -Have consulted Cardiology for pre-operative clearance, appreciate input -continue with analgesia as needed   Hypothyroidism Question of new diagnosis of hypothyroidism: -TSH 6.794 with free T4 of 1.31   AKI (acute kidney injury) ([place]) Creat at [facility] during admit: 1.6->1.0-> 1.4 Cr 1.47 this AM, appears dry on exam -Cont on LR at 75cc/hr -Recheck bmet in AM   [place], CHRONIC Holding ARB given AKI Cont BB Reportedly did require dose of lasix for concerns of volume overload on 2/3 at [facility]   DM2 (diabetes mellitus, type 2) (HCC) Lantus 5u QHS Sensitive SSI Q4H for the moment.   Paroxysmal atrial fibrillation (HCC) Reportedly ran out of  xarelto at home last month -continuing to hold xarelto, last dose was 2/4 at [facility] -cont heparin gtt for now  CAD with known RCA occlusion -Cardiology following -Recs to eventually d/c on ASA and xarelto when ultimately OK with General Surgery post-op -cont BB -Did have chest pains this afternoon, increased morphine dose and ordered PRN NTG       Subjective: Complained of mild upper quadrant discomfort this AM  Physical Exam: Vitals:   05/25/22 0452 05/25/22 0902 05/25/22 1648 05/25/22 1703  BP: (!) 133/55 (!) 96/45 122/82 (!) 97/52  Pulse: (!) 110 92 87 89  Resp:  17 19   Temp: 99.5 F (37.5 C) 99 F (37.2 C) 98 F (36.7 C)   TempSrc: Oral Oral    SpO2: 93% 93% 100%   Weight:      Height:       General exam: Awake, laying in bed, in nad Respiratory system: Normal respiratory effort, no wheezing Cardiovascular system: regular rate, s1, s2 Gastrointestinal system: Soft, nondistended, positive BS Central nervous system: CN2-12 grossly intact, strength intact Extremities: Perfused, no clubbing Skin: Normal skin turgor, no notable skin lesions seen Psychiatry: Mood normal // no visual hallucinations   Data Reviewed:  Labs reviewed: Na 131, K 3.2, Cr 1.47, WBC 10.0, hgb 8.8, Plts 411   Family Communication: Pt in room, family not at bedside  Disposition: Status is: Inpatient Remains inpatient appropriate because: Severity of illness  Planned Discharge Destination: Home    Author: Marylu Lund, MD 05/25/2022 5:10 PM  For on call review [email or web address].

## 2022-05-25 NOTE — TOC Initial Note (Signed)
Transition of Care [facility]) - Initial/Assessment Note    Patient Details  Name: [name] MRN: [id number] Date of Birth: 10/19/1945  Transition of [facility] CM/SW Contact:    Tom-Johnson, Renea Ee, RN Phone Number: 05/25/2022, 3:32 PM  Clinical Narrative:                  CM spoke with patient and daughter, Crystal at bedside about needs for post hospital transition.  Admitted for Large Intestine Diverticulitis with Abscess with drain placement. Patient transferred from [facility] to [facility] for further medical workup. General surgery following, possible Ex-lap Partial Colectomy and Colostomy. Patient has extensive Cardiac hx with EF of 25%. Cardiology consulted.  Patient is from home alone. Daughter Donella Stade is supportive. Patient independent and very active prior to hospitalization. No DME's at home. PCP is Garwin Brothers, MD and uses [facility] on [address] in [place].  No TOC needs or recommendations noted at this time. CM will continue to follow as patient progresses with care towards discharge.            Barriers to Discharge: Continued Medical Work up   Patient Goals and CMS Choice Patient states their goals for this hospitalization and ongoing recovery are:: To return home CMS Medicare.gov Compare Post Acute Care list provided to:: Patient Choice offered to / list presented to : Patient, Adult Children (Daughter, Teacher, music)      Expected Discharge Plan and Services   Discharge Planning Services: CM Consult   Living arrangements for the past 2 months: Single Family Home                                      Prior Living Arrangements/Services Living arrangements for the past 2 months: Single Family Home Lives with:: Self Patient language and need for interpreter reviewed:: Yes Do you feel safe going back to the place where you live?: Yes      Need for Family Participation in Patient Care: Yes (Comment) Care giver support system in  place?: Yes (comment)   Criminal Activity/Legal Involvement Pertinent to Current Situation/Hospitalization: No - Comment as needed  Activities of Daily Living Home Assistive Devices/Equipment: Dentures (specify type) ADL Screening (condition at time of admission) Patient's cognitive ability adequate to safely complete daily activities?: Yes Is the patient deaf or have difficulty hearing?: Yes Does the patient have difficulty seeing, even when wearing glasses/contacts?: No Does the patient have difficulty concentrating, remembering, or making decisions?: No Patient able to express need for assistance with ADLs?: Yes Does the patient have difficulty dressing or bathing?: No Independently performs ADLs?: Yes (appropriate for developmental age) Does the patient have difficulty walking or climbing stairs?: Yes Weakness of Legs: Both Weakness of Arms/Hands: None  Permission Sought/Granted Permission sought to share information with : Case Manager, Family Supports Permission granted to share information with : Yes, Verbal Permission Granted              Emotional Assessment Appearance:: Appears stated age Attitude/Demeanor/Rapport: Engaged, Gracious Affect (typically observed): Accepting, Appropriate, Calm, Hopeful, Pleasant Orientation: : Oriented to Self, Oriented to Place, Oriented to  Time, Oriented to Situation Alcohol / Substance Use: Not Applicable Psych Involvement: No (comment)  Admission diagnosis:  Diverticulitis of large intestine with abscess [K57.20] Patient Active Problem List   Diagnosis Date Noted   Diverticulitis of large intestine with abscess 05/24/2022   AKI (acute kidney injury) ([place])  05/24/2022   Hypothyroidism 05/24/2022   Need for prophylactic vaccination and inoculation against influenza 03/28/2022   Lethargy 03/28/2022   Current moderate episode of major depressive disorder without prior episode ([place]) 03/28/2022   Bladder cancer ([place]) 02/09/2022    Chronic systolic CHF (congestive heart failure) ([place]) 02/09/2022   Cigarette smoker 02/09/2022   Coronary artery disease 02/09/2022   Ischemic cardiomyopathy 02/09/2022   Lumbar spinal stenosis 02/09/2022   Myocardial infarction ([place]) 02/09/2022   NSVT (nonsustained ventricular tachycardia) ([place]) 02/09/2022   Paroxysmal atrial fibrillation ([place]) 02/09/2022   PVC's (premature ventricular contractions) 02/09/2022   DM2 (diabetes mellitus, type 2) ([place]) 02/09/2022   Diverticulitis large intestine 05/11/2021   Status post coronary artery stent placement    Unstable angina ([place]) 10/16/2017   Coronary artery disease involving native coronary artery of native heart with unstable angina pectoris ([place])    Acute low back pain 01/12/2015   Atherosclerosis of native arteries of the extremities with intermittent claudication 08/13/2013   Backache 02/08/2013   TOBACCO ABUSE 05/29/2009   HYPERLIPIDEMIA-MIXED 09/22/2008   HYPERTENSION, BENIGN 09/22/2008   CAD, NATIVE VESSEL 91/63/8466   SYSTOLIC HEART FAILURE, CHRONIC 09/22/2008   PVD 09/22/2008   PCP:  Garwin Brothers, MD Pharmacy:   [facility], [address] AT [place] [address] [place] [address] Phone: [phone] Fax: [phone]     Social Determinants of Health (SDOH) Social History: SDOH Screenings   Food Insecurity: No Food Insecurity (05/24/2022)  Housing: Low Risk  (05/24/2022)  Transportation Needs: No Transportation Needs (05/24/2022)  Utilities: Not At Risk (05/24/2022)  Depression (PHQ2-9): High Risk (03/28/2022)  Tobacco Use: Medium Risk (04/22/2022)   SDOH Interventions: Transportation Interventions: Intervention Not Indicated, Inpatient TOC, Patient Resources (Friends/Family)   Readmission Risk Interventions     No data to display

## 2022-05-26 ENCOUNTER — Inpatient Hospital Stay (HOSPITAL_COMMUNITY): Payer: Medicare Other

## 2022-05-26 ENCOUNTER — Encounter (HOSPITAL_COMMUNITY)
Admission: EM | Disposition: A | Discharge status: Home Health | Payer: Self-pay | Source: Other Acute Inpatient Hospital | Attending: Internal Medicine

## 2022-05-26 ENCOUNTER — Encounter (HOSPITAL_COMMUNITY): Payer: Self-pay | Admitting: Internal Medicine

## 2022-05-26 ENCOUNTER — Inpatient Hospital Stay (HOSPITAL_COMMUNITY): Payer: Medicare Other | Admitting: Certified Registered Nurse Anesthetist

## 2022-05-26 ENCOUNTER — Other Ambulatory Visit: Payer: Self-pay

## 2022-05-26 DIAGNOSIS — I110 Hypertensive heart disease with heart failure: Secondary | ICD-10-CM

## 2022-05-26 DIAGNOSIS — K5792 Diverticulitis of intestine, part unspecified, without perforation or abscess without bleeding: Secondary | ICD-10-CM

## 2022-05-26 DIAGNOSIS — I509 Heart failure, unspecified: Secondary | ICD-10-CM | POA: Diagnosis not present

## 2022-05-26 DIAGNOSIS — I252 Old myocardial infarction: Secondary | ICD-10-CM

## 2022-05-26 DIAGNOSIS — I25119 Atherosclerotic heart disease of native coronary artery with unspecified angina pectoris: Secondary | ICD-10-CM

## 2022-05-26 DIAGNOSIS — Z87891 Personal history of nicotine dependence: Secondary | ICD-10-CM

## 2022-05-26 DIAGNOSIS — K5720 Diverticulitis of large intestine with perforation and abscess without bleeding: Secondary | ICD-10-CM | POA: Diagnosis not present

## 2022-05-26 HISTORY — PX: LAPAROTOMY: SHX154

## 2022-05-26 HISTORY — PX: COLECTOMY WITH COLOSTOMY CREATION/HARTMANN PROCEDURE: SHX6598

## 2022-05-26 LAB — GLUCOSE, CAPILLARY
Glucose-Capillary: 136 mg/dL — ABNORMAL HIGH (ref 70–99)
Glucose-Capillary: 131 mg/dL — ABNORMAL HIGH (ref 70–99)
Glucose-Capillary: 175 mg/dL — ABNORMAL HIGH (ref 70–99)
Glucose-Capillary: 182 mg/dL — ABNORMAL HIGH (ref 70–99)
Glucose-Capillary: 207 mg/dL — ABNORMAL HIGH (ref 70–99)
Glucose-Capillary: 169 mg/dL — ABNORMAL HIGH (ref 70–99)

## 2022-05-26 LAB — HEPARIN LEVEL (UNFRACTIONATED): Heparin Unfractionated: 0.15 IU/mL — ABNORMAL LOW (ref 0.30–0.70)

## 2022-05-26 LAB — COMPREHENSIVE METABOLIC PANEL
ALT: 13 U/L (ref 0–44)
AST: 16 U/L (ref 15–41)
Albumin: 1.9 g/dL — ABNORMAL LOW (ref 3.5–5.0)
Alkaline Phosphatase: 37 U/L — ABNORMAL LOW (ref 38–126)
Anion gap: 16 — ABNORMAL HIGH (ref 5–15)
BUN: 16 mg/dL (ref 8–23)
CO2: 20 mmol/L — ABNORMAL LOW (ref 22–32)
Calcium: 8.2 mg/dL — ABNORMAL LOW (ref 8.9–10.3)
Chloride: 97 mmol/L — ABNORMAL LOW (ref 98–111)
Creatinine, Ser: 1.42 mg/dL — ABNORMAL HIGH (ref 0.61–1.24)
GFR, Estimated: 51 mL/min — ABNORMAL LOW (ref >60)
Glucose, Bld: 134 mg/dL — ABNORMAL HIGH (ref 70–99)
Potassium: 4.5 mmol/L (ref 3.5–5.1)
Sodium: 133 mmol/L — ABNORMAL LOW (ref 135–145)
Total Bilirubin: 1 mg/dL (ref 0.3–1.2)
Total Protein: 5.2 g/dL — ABNORMAL LOW (ref 6.5–8.1)

## 2022-05-26 LAB — POCT I-STAT 7, (LYTES, BLD GAS, ICA,H+H)
Acid-base deficit: 6 mmol/L — ABNORMAL HIGH (ref 0.0–2.0)
Bicarbonate: 19.7 mmol/L — ABNORMAL LOW (ref 20.0–28.0)
Calcium, Ion: 1.19 mmol/L (ref 1.15–1.40)
HCT: 29 % — ABNORMAL LOW (ref 39.0–52.0)
Hemoglobin: 9.9 g/dL — ABNORMAL LOW (ref 13.0–17.0)
O2 Saturation: 97 %
Potassium: 4.3 mmol/L (ref 3.5–5.1)
Sodium: 134 mmol/L — ABNORMAL LOW (ref 135–145)
TCO2: 21 mmol/L — ABNORMAL LOW (ref 22–32)
pCO2 arterial: 38.6 mmHg (ref 32–48)
pH, Arterial: 7.317 — ABNORMAL LOW (ref 7.35–7.45)
pO2, Arterial: 93 mmHg (ref 83–108)

## 2022-05-26 LAB — CBC
HCT: 26.2 % — ABNORMAL LOW (ref 39.0–52.0)
Hemoglobin: 8.5 g/dL — ABNORMAL LOW (ref 13.0–17.0)
MCH: 27.7 pg (ref 26.0–34.0)
MCHC: 32.4 g/dL (ref 30.0–36.0)
MCV: 85.3 fL (ref 80.0–100.0)
Platelets: 412 10*3/uL — ABNORMAL HIGH (ref 150–400)
RBC: 3.07 MIL/uL — ABNORMAL LOW (ref 4.22–5.81)
RDW: 17.8 % — ABNORMAL HIGH (ref 11.5–15.5)
WBC: 10.6 10*3/uL — ABNORMAL HIGH (ref 4.0–10.5)
nRBC: 0 % (ref 0.0–0.2)

## 2022-05-26 LAB — POCT ACTIVATED CLOTTING TIME: Activated Clotting Time: 158 seconds

## 2022-05-26 LAB — PREPARE RBC (CROSSMATCH)

## 2022-05-26 LAB — APTT: aPTT: 52 seconds — ABNORMAL HIGH (ref 24–36)

## 2022-05-26 SURGERY — LAPAROTOMY, EXPLORATORY
Anesthesia: General

## 2022-05-26 MED ORDER — PROTAMINE SULFATE 10 MG/ML IV SOLN
INTRAVENOUS | Status: DC | PRN
  Administered 2022-05-26: 20 mg via INTRAVENOUS

## 2022-05-26 MED ORDER — ACETAMINOPHEN 10 MG/ML IV SOLN
1000.0000 mg | Freq: Four times a day (QID) | INTRAVENOUS | Status: DC
  Administered 2022-05-26 – 2022-05-27 (×2): 1000 mg via INTRAVENOUS
  Filled 2022-05-26 (×4): qty 100

## 2022-05-26 MED ORDER — ALBUMIN HUMAN 5 % IV SOLN: INTRAVENOUS | Status: DC | PRN

## 2022-05-26 MED ORDER — FENTANYL CITRATE (PF) 250 MCG/5ML IJ SOLN
INTRAMUSCULAR | Status: AC
  Filled 2022-05-26: qty 5

## 2022-05-26 MED ORDER — ACETAMINOPHEN 10 MG/ML IV SOLN
INTRAVENOUS | Status: DC | PRN
  Administered 2022-05-26: 1000 mg via INTRAVENOUS

## 2022-05-26 MED ORDER — SUGAMMADEX SODIUM 200 MG/2ML IV SOLN
INTRAVENOUS | Status: DC | PRN
  Administered 2022-05-26: 200 mg via INTRAVENOUS

## 2022-05-26 MED ORDER — SODIUM CHLORIDE 0.9 % IV SOLN: 10.0000 mL/h | Freq: Once | INTRAVENOUS | Status: AC

## 2022-05-26 MED ORDER — 0.9 % SODIUM CHLORIDE (POUR BTL) OPTIME
TOPICAL | Status: DC | PRN
  Administered 2022-05-26: 4000 mL

## 2022-05-26 MED ORDER — ETOMIDATE 2 MG/ML IV SOLN
INTRAVENOUS | Status: DC | PRN
  Administered 2022-05-26: 12 mg via INTRAVENOUS

## 2022-05-26 MED ORDER — AMISULPRIDE (ANTIEMETIC) 5 MG/2ML IV SOLN: 10.0000 mg | Freq: Once | INTRAVENOUS | Status: DC | PRN

## 2022-05-26 MED ORDER — FENTANYL CITRATE (PF) 100 MCG/2ML IJ SOLN
INTRAMUSCULAR | Status: DC | PRN
  Administered 2022-05-26 (×2): 50 ug via INTRAVENOUS

## 2022-05-26 MED ORDER — MORPHINE SULFATE (PF) 2 MG/ML IV SOLN
2.0000 mg | INTRAVENOUS | Status: DC | PRN
  Administered 2022-05-26: 2 mg via INTRAVENOUS
  Administered 2022-05-27 (×4): 4 mg via INTRAVENOUS
  Administered 2022-05-27: 2 mg via INTRAVENOUS
  Administered 2022-05-28 – 2022-05-29 (×3): 4 mg via INTRAVENOUS
  Administered 2022-05-30 – 2022-05-31 (×2): 3 mg via INTRAVENOUS
  Administered 2022-05-31: 2 mg via INTRAVENOUS
  Filled 2022-05-26 (×8): qty 2
  Filled 2022-05-26: qty 1
  Filled 2022-05-26: qty 2
  Filled 2022-05-26 (×2): qty 1
  Filled 2022-05-26: qty 2

## 2022-05-26 MED ORDER — ROCURONIUM BROMIDE 10 MG/ML (PF) SYRINGE
PREFILLED_SYRINGE | INTRAVENOUS | Status: DC | PRN
  Administered 2022-05-26: 70 mg via INTRAVENOUS
  Administered 2022-05-26: 20 mg via INTRAVENOUS

## 2022-05-26 MED ORDER — DEXAMETHASONE SODIUM PHOSPHATE 10 MG/ML IJ SOLN
INTRAMUSCULAR | Status: DC | PRN
  Administered 2022-05-26: 5 mg via INTRAVENOUS

## 2022-05-26 MED ORDER — ACETAMINOPHEN 10 MG/ML IV SOLN
INTRAVENOUS | Status: AC
  Filled 2022-05-26: qty 100

## 2022-05-26 MED ORDER — FENTANYL CITRATE (PF) 250 MCG/5ML IJ SOLN
INTRAMUSCULAR | Status: DC | PRN
  Administered 2022-05-26 (×2): 50 ug via INTRAVENOUS
  Administered 2022-05-26: 100 ug via INTRAVENOUS
  Administered 2022-05-26: 50 ug via INTRAVENOUS

## 2022-05-26 MED ORDER — PHENYLEPHRINE HCL-NACL 20-0.9 MG/250ML-% IV SOLN
INTRAVENOUS | Status: DC | PRN
  Administered 2022-05-26: 25 ug/min via INTRAVENOUS

## 2022-05-26 MED ORDER — PIPERACILLIN-TAZOBACTAM 3.375 G IVPB
3.3750 g | Freq: Three times a day (TID) | INTRAVENOUS | Status: AC
  Administered 2022-05-26 – 2022-05-30 (×12): 3.375 g via INTRAVENOUS
  Filled 2022-05-26 (×12): qty 50

## 2022-05-26 MED ORDER — VASOPRESSIN 20 UNIT/ML IV SOLN
INTRAVENOUS | Status: AC
  Filled 2022-05-26: qty 1

## 2022-05-26 MED ORDER — CHLORHEXIDINE GLUCONATE CLOTH 2 % EX PADS
6.0000 | MEDICATED_PAD | Freq: Every day | CUTANEOUS | Status: DC
  Administered 2022-05-27 – 2022-06-01 (×6): 6 via TOPICAL

## 2022-05-26 MED ORDER — METHOCARBAMOL 1000 MG/10ML IJ SOLN
500.0000 mg | Freq: Four times a day (QID) | INTRAVENOUS | Status: DC
  Administered 2022-05-26 (×2): 500 mg via INTRAVENOUS
  Filled 2022-05-26 (×2): qty 500

## 2022-05-26 MED ORDER — HEPARIN (PORCINE) 25000 UT/250ML-% IV SOLN
2300.0000 [IU]/h | INTRAVENOUS | Status: DC
  Administered 2022-05-26: 1800 [IU]/h via INTRAVENOUS
  Administered 2022-05-27: 2000 [IU]/h via INTRAVENOUS
  Filled 2022-05-26: qty 250

## 2022-05-26 MED ORDER — ONDANSETRON HCL 4 MG/2ML IJ SOLN
INTRAMUSCULAR | Status: DC | PRN
  Administered 2022-05-26: 4 mg via INTRAVENOUS

## 2022-05-26 MED ORDER — PHENYLEPHRINE 80 MCG/ML (10ML) SYRINGE FOR IV PUSH (FOR BLOOD PRESSURE SUPPORT)
PREFILLED_SYRINGE | INTRAVENOUS | Status: DC | PRN
  Administered 2022-05-26 (×2): 160 ug via INTRAVENOUS
  Administered 2022-05-26 (×2): 80 ug via INTRAVENOUS
  Administered 2022-05-26: 160 ug via INTRAVENOUS
  Administered 2022-05-26 (×3): 80 ug via INTRAVENOUS

## 2022-05-26 MED ORDER — SODIUM CHLORIDE 0.9% IV SOLUTION: Freq: Once | INTRAVENOUS | Status: DC

## 2022-05-26 MED ORDER — EPHEDRINE SULFATE-NACL 50-0.9 MG/10ML-% IV SOSY
PREFILLED_SYRINGE | INTRAVENOUS | Status: DC | PRN
  Administered 2022-05-26: 10 mg via INTRAVENOUS

## 2022-05-26 MED ORDER — CHLORHEXIDINE GLUCONATE 0.12 % MT SOLN
OROMUCOSAL | Status: AC
  Filled 2022-05-26: qty 15

## 2022-05-26 MED ORDER — SODIUM CHLORIDE 0.9% FLUSH
10.0000 mL | Freq: Three times a day (TID) | INTRAVENOUS | Status: DC
  Administered 2022-05-26 – 2022-06-01 (×15): 10 mL

## 2022-05-26 MED ORDER — HYDROMORPHONE HCL 1 MG/ML IJ SOLN: 0.2500 mg | INTRAMUSCULAR | Status: DC | PRN

## 2022-05-26 MED ORDER — FENTANYL CITRATE (PF) 100 MCG/2ML IJ SOLN
INTRAMUSCULAR | Status: AC
  Filled 2022-05-26: qty 2

## 2022-05-26 SURGICAL SUPPLY — 43 items
BIOPATCH RED 1 DISK 7.0 (GAUZE/BANDAGES/DRESSINGS) IMPLANT
BLADE CLIPPER SURG (BLADE) IMPLANT
CANISTER SUCT 3000ML PPV (MISCELLANEOUS) ×1 IMPLANT
CHLORAPREP W/TINT 26 (MISCELLANEOUS) ×1 IMPLANT
COVER SURGICAL LIGHT HANDLE (MISCELLANEOUS) ×1 IMPLANT
DRAIN CHANNEL 19F RND (DRAIN) IMPLANT
DRAPE LAPAROSCOPIC ABDOMINAL (DRAPES) ×1 IMPLANT
DRAPE WARM FLUID 44X44 (DRAPES) ×1 IMPLANT
ELECT CAUTERY BLADE 6.4 (BLADE) ×1 IMPLANT
ELECT REM PT RETURN 9FT ADLT (ELECTROSURGICAL) ×1
ELECTRODE REM PT RTRN 9FT ADLT (ELECTROSURGICAL) ×1 IMPLANT
EVACUATOR SILICONE 100CC (DRAIN) IMPLANT
GAUZE PAD ABD 8X10 STRL (GAUZE/BANDAGES/DRESSINGS) IMPLANT
GAUZE SPONGE 4X4 12PLY STRL (GAUZE/BANDAGES/DRESSINGS) IMPLANT
GLOVE BIO SURGEON STRL SZ 6.5 (GLOVE) ×1 IMPLANT
GLOVE BIOGEL PI IND STRL 6 (GLOVE) ×1 IMPLANT
GOWN STRL REUS W/ TWL LRG LVL3 (GOWN DISPOSABLE) ×2 IMPLANT
GOWN STRL REUS W/TWL LRG LVL3 (GOWN DISPOSABLE) ×2
HANDLE SUCTION POOLE (INSTRUMENTS) ×1 IMPLANT
KIT BASIN OR (CUSTOM PROCEDURE TRAY) ×1 IMPLANT
KIT OSTOMY DRAINABLE 2.75 STR (WOUND CARE) IMPLANT
KIT TURNOVER KIT B (KITS) ×1 IMPLANT
LIGASURE IMPACT 36 18CM CVD LR (INSTRUMENTS) IMPLANT
NS IRRIG 1000ML POUR BTL (IV SOLUTION) ×2 IMPLANT
PACK GENERAL/GYN (CUSTOM PROCEDURE TRAY) ×1 IMPLANT
PAD ARMBOARD 7.5X6 YLW CONV (MISCELLANEOUS) ×1 IMPLANT
RELOAD PROXIMATE 75MM BLUE (ENDOMECHANICALS) ×1 IMPLANT
RELOAD STAPLE 75 3.8 BLU REG (ENDOMECHANICALS) IMPLANT
SPONGE T-LAP 18X18 ~~LOC~~+RFID (SPONGE) IMPLANT
STAPLER PROXIMATE 75MM BLUE (STAPLE) IMPLANT
STAPLER VISISTAT 35W (STAPLE) ×1 IMPLANT
SUCTION POOLE HANDLE (INSTRUMENTS) ×1
SUT ETHILON 2 0 FS 18 (SUTURE) IMPLANT
SUT PDS AB 1 TP1 54 (SUTURE) IMPLANT
SUT PDS AB 1 TP1 96 (SUTURE) IMPLANT
SUT SILK 2 0 SH CR/8 (SUTURE) ×1 IMPLANT
SUT SILK 2 0 TIES 10X30 (SUTURE) ×1 IMPLANT
SUT SILK 3 0 SH CR/8 (SUTURE) ×1 IMPLANT
SUT SILK 3 0 TIES 10X30 (SUTURE) ×1 IMPLANT
SUT VIC AB 3-0 SH 18 (SUTURE) IMPLANT
TOWEL GREEN STERILE (TOWEL DISPOSABLE) ×1 IMPLANT
TRAY FOLEY MTR SLVR 16FR STAT (SET/KITS/TRAYS/PACK) IMPLANT
YANKAUER SUCT BULB TIP NO VENT (SUCTIONS) IMPLANT

## 2022-05-26 NOTE — Progress Notes (Signed)
ANTICOAGULATION CONSULT NOTE - Initial Consult  Pharmacy Consult for heparin Indication: atrial fibrillation  Allergies  Allergen Reactions   Lisinopril Swelling and Rash    Rash - face and tounge swell    Patient Measurements: Height: '5\' 7"'$  (170.2 cm) Weight: 70.4 kg (155 lb 3.3 oz) IBW/kg (Calculated) : 66.1  Vital Signs: Temp: 97.6 F (36.4 C) (02/08 1429) Temp Source: Oral (02/08 1429) BP: 114/50 (02/08 1506) Pulse Rate: 74 (02/08 1506)  Labs: Recent Labs    05/25/22 0712 05/25/22 1631 05/25/22 1827 05/25/22 1909 05/26/22 0347 05/26/22 1125  HGB 8.6* 8.8*  --   --  8.5* 9.9*  HCT 26.0* 27.4*  --   --  26.2* 29.0*  PLT 348 411*  --   --  412*  --   APTT 42* 45*  --   --  52*  --   HEPARINUNFRC 0.63 0.34  --   --  0.15*  --   CREATININE 1.47*  --   --   --  1.42*  --   TROPONINIHS  --   --  25* 23*  --   --     Estimated Creatinine Clearance: 40.7 mL/min (A) (by C-G formula based on SCr of 1.42 mg/dL (H)).   Medical History: Past Medical History:  Diagnosis Date   Atherosclerosis of native arteries of the extremities with intermittent claudication 08/13/2013   Bladder cancer [facility])    resection x3   CAD, NATIVE VESSEL 09/22/2008   Qualifier: Diagnosis of  By: Jaramillo, [place], ISCHEMIC 09/22/2008   Qualifier: Diagnosis of  By: Sidney Ace     Chronic systolic CHF (congestive heart failure) ([place])    Cigarette smoker    Coronary artery disease    status post DMI RX Taxus stent RCA 2006 with susequent Stent LAD and subsequent  stent thrombosis RCA unable to be opened 2006 -neg mv 10/2008, 10/16/17 ISR to pLAD with PTCA/DES, CTO of RCA with collaterals, EF 25%   Coronary artery disease involving native coronary artery of native heart with unstable angina pectoris ([place])    Diverticulitis of sigmoid colon 05/11/2021   HYPERLIPIDEMIA-MIXED 09/22/2008   Qualifier: Diagnosis of  By: Jaramillo, [place], BENIGN 09/22/2008   Qualifier: Diagnosis  of  By: Sidney Ace     Ischemic cardiomyopathy    ejection fraction of 40-45%   Lumbar spinal stenosis    Myocardial infarction ([place])    "I've had 4" (10/16/2017)   NSVT (nonsustained ventricular tachycardia) (HCC)    Paroxysmal atrial fibrillation (HCC)    PVC's (premature ventricular contractions)    PVD 09/22/2008   Qualifier: Diagnosis of  By: Sidney Ace     Status post coronary artery stent placement    SYSTOLIC HEART FAILURE, CHRONIC 09/22/2008   Qualifier: Diagnosis of  By: Jaramillo, [place] 05/29/2009   Qualifier: Diagnosis of  By: Earley Favor RN, BSN, Patricia L    Type II diabetes mellitus ([place])    Unstable angina ([place]) 10/16/2017    Medications:  Medications Prior to Admission  Medication Sig Dispense Refill Last Dose   amLODipine (NORVASC) 5 MG tablet Take 1 tablet (5 mg total) by mouth daily. 90 tablet 3 05/19/2022   aspirin EC 81 MG tablet Take 1 tablet (81 mg total) by mouth daily. Swallow whole. 90 tablet 3 05/19/2022   budesonide-formoterol (SYMBICORT) 160-4.5 MCG/ACT inhaler Inhale 2 puffs into the lungs 2 (two) times daily  as needed for wheezing or shortness of breath.   Past Week   carvedilol (COREG) 12.5 MG tablet Take 12.5 mg by mouth 2 (two) times daily with a meal.   05/19/2022   cetirizine (ZYRTEC) 10 MG tablet Take 10 mg by mouth daily.   05/19/2022   glipiZIDE (GLUCOTROL) 10 MG tablet Take 1 tablet (10 mg total) by mouth 2 (two) times daily. 60 tablet 6 05/19/2022   rivaroxaban (XARELTO) 20 MG TABS tablet Take 20 mg by mouth daily with supper.   05/19/2022 at 1700   rosuvastatin (CRESTOR) 20 MG tablet Take 1 tablet (20 mg total) by mouth daily. 90 tablet 3 05/19/2022   sacubitril-valsartan (ENTRESTO) 24-26 MG Take 1 tablet by mouth 2 (two) times daily.   05/19/2022   sertraline (ZOLOFT) 50 MG tablet Take 1 tablet (50 mg total) by mouth daily. (Patient not taking: Reported on 05/24/2022) 30 tablet 2 Not Taking   XARELTO 20 MG TABS tablet TAKE 1 TABLET BY MOUTH  DAILY WITH SUPPER (Patient not taking: Reported on 05/24/2022) 90 tablet 1 Not Taking   Scheduled:   sodium chloride   Intravenous Once   carvedilol  12.5 mg Oral BID WC   chlorhexidine       insulin aspart  0-9 Units Subcutaneous Q4H   insulin glargine-yfgn  5 Units Subcutaneous QHS   sodium chloride flush  10 mL Intracatheter Q8H   Infusions:   acetaminophen     lactated ringers 75 mL/hr at 05/26/22 1002   methocarbamol (ROBAXIN) IV     piperacillin-tazobactam (ZOSYN)  IV 3.375 g (05/26/22 1518)    Assessment: 77yo male admitted for complicated diverticulitis requiring surgical intervention, now s/p ex-lap colectomy/colostomy, to resume heparin for Afib at 1900 today.  Goal of Therapy:  Heparin level 0.3-0.7 units/ml Monitor platelets by anticoagulation protocol: Yes   Plan:  At 1900 start heparin infusion at 1800 units/hr. Monitor heparin levels and CBC.  Wynona Neat, PharmD, BCPS  05/26/2022,3:27 PM

## 2022-05-26 NOTE — Progress Notes (Signed)
ANTICOAGULATION CONSULT NOTE  Pharmacy Consult for Heparin Indication: atrial fibrillation  Allergies  Allergen Reactions   Lisinopril Swelling and Rash    Rash - face and tounge swell    Patient Measurements: Height: '5\' 7"'$  (170.2 cm) Weight: 70.4 kg (155 lb 3.3 oz) IBW/kg (Calculated) : 66.1 Heparin Dosing Weight: 70 kg  Vital Signs: Temp: 99.6 F (37.6 C) (02/08 0434) Temp Source: Oral (02/08 0434) BP: 106/61 (02/08 0434) Pulse Rate: 94 (02/08 0434)  Labs: Recent Labs    05/25/22 0712 05/25/22 1631 05/25/22 1827 05/25/22 1909 05/26/22 0347  HGB 8.6* 8.8*  --   --  8.5*  HCT 26.0* 27.4*  --   --  26.2*  PLT 348 411*  --   --  412*  APTT 42* 45*  --   --  52*  HEPARINUNFRC 0.63 0.34  --   --  0.15*  CREATININE 1.47*  --   --   --  1.42*  TROPONINIHS  --   --  25* 23*  --      Estimated Creatinine Clearance: 40.7 mL/min (A) (by C-G formula based on SCr of 1.42 mg/dL (H)).   Medical History: Past Medical History:  Diagnosis Date   Atherosclerosis of native arteries of the extremities with intermittent claudication 08/13/2013   Bladder cancer [facility])    resection x3   CAD, NATIVE VESSEL 09/22/2008   Qualifier: Diagnosis of  By: Jaramillo, [place], ISCHEMIC 09/22/2008   Qualifier: Diagnosis of  By: Sidney Ace     Chronic systolic CHF (congestive heart failure) ([place])    Cigarette smoker    Coronary artery disease    status post DMI RX Taxus stent RCA 2006 with susequent Stent LAD and subsequent  stent thrombosis RCA unable to be opened 2006 -neg mv 10/2008, 10/16/17 ISR to pLAD with PTCA/DES, CTO of RCA with collaterals, EF 25%   Coronary artery disease involving native coronary artery of native heart with unstable angina pectoris ([place])    Diverticulitis of sigmoid colon 05/11/2021   HYPERLIPIDEMIA-MIXED 09/22/2008   Qualifier: Diagnosis of  By: Jaramillo, [place], BENIGN 09/22/2008   Qualifier: Diagnosis of  By: Sidney Ace     Ischemic  cardiomyopathy    ejection fraction of 40-45%   Lumbar spinal stenosis    Myocardial infarction ([place])    "I've had 4" (10/16/2017)   NSVT (nonsustained ventricular tachycardia) (HCC)    Paroxysmal atrial fibrillation (HCC)    PVC's (premature ventricular contractions)    PVD 09/22/2008   Qualifier: Diagnosis of  By: Sidney Ace     Status post coronary artery stent placement    SYSTOLIC HEART FAILURE, CHRONIC 09/22/2008   Qualifier: Diagnosis of  By: Jaramillo, [place] 05/29/2009   Qualifier: Diagnosis of  By: Earley Favor RN, BSN, Patricia L    Type II diabetes mellitus ([place])    Unstable angina ([place]) 10/16/2017     Assessment: 62 YOM presenting with diverticulitis and abscess of the intestine. On Xarelto PTA for Afib; however, recently ran out. Last dose 2/4 at outside hospital. Pharmacy consulted to dose heparin while determining surgery plans.  aPTT this evening remains subtherapeutic despite rate adjustment, heparin level appears closer to correlating as DOAC clears. H/H stable on evening check.  2/8 AM update:  Heparin level low No longer need aPTT's     Goal of Therapy:  Heparin level 0.3-0.7 units/ml aPTT 66-102 seconds Monitor platelets  by anticoagulation protocol: Yes   Plan:  Inc heparin to 1700 units/hr 8 hour heparin level  Narda Bonds, PharmD, BCPS Clinical Pharmacist Phone: [phone]

## 2022-05-26 NOTE — Anesthesia Preprocedure Evaluation (Addendum)
Anesthesia Evaluation  Patient identified by MRN, date of birth, ID band Patient awake    Reviewed: Allergy & Precautions, NPO status , Patient's Chart, lab work & pertinent test results  Airway Mallampati: II  TM Distance: >3 FB Neck ROM: Full    Dental  (+) Dental Advisory Given, Poor Dentition, Missing   Pulmonary Patient abstained from smoking., former smoker   Pulmonary exam normal breath sounds clear to auscultation       Cardiovascular hypertension, Pt. on medications and Pt. on home beta blockers + angina  + CAD, + Past MI, + Peripheral Vascular Disease and +CHF   Rhythm:Regular Rate:Normal  Echo 02/2022  1. Left ventricular ejection fraction, by estimation, is 60 to 65%. Left ventricular ejection fraction by PLAX is 25 %. The left ventricle has severely decreased function. The left ventricle demonstrates global hypokinesis. The left ventricular internal  cavity size was mildly dilated. Left ventricular diastolic parameters are consistent with Grade II diastolic dysfunction (pseudonormalization). Elevated left atrial pressure. The average left ventricular global longitudinal strain is -4.9 %. The global longitudinal strain is abnormal.  2. Right ventricular systolic function is mildly reduced. The right ventricular size is normal. There is normal pulmonary artery systolic pressure.  3. Left atrial size was mildly dilated.  4. The mitral valve is degenerative. Mild mitral valve regurgitation. No evidence of mitral stenosis.  5. The aortic valve is normal in structure. Aortic valve regurgitation is not visualized. No aortic stenosis is present.  6. Aortic Normal DTA.  7. The inferior vena cava is normal in size with greater than 50%respiratory variability, suggesting right atrial pressure of 3 mmHg.     Neuro/Psych  PSYCHIATRIC DISORDERS  Depression    negative neurological ROS     GI/Hepatic negative GI ROS, Neg liver  ROS,,,  Endo/Other  diabetesHypothyroidism    Renal/GU Renal disease     Musculoskeletal negative musculoskeletal ROS (+)    Abdominal   Peds  Hematology negative hematology ROS (+)   Anesthesia Other Findings   Reproductive/Obstetrics                             Anesthesia Physical Anesthesia Plan  ASA: 4  Anesthesia Plan: General   Post-op Pain Management: Ofirmev IV (intra-op)*   Induction: Intravenous  PONV Risk Score and Plan: 4 or greater and Ondansetron, Dexamethasone and Treatment may vary due to age or medical condition  Airway Management Planned: Oral ETT  Additional Equipment: Arterial line and CVP  Intra-op Plan:   Post-operative Plan: Possible Post-op intubation/ventilation  Informed Consent: I have reviewed the patients History and Physical, chart, labs and discussed the procedure including the risks, benefits and alternatives for the proposed anesthesia with the patient or authorized representative who has indicated his/her understanding and acceptance.     Dental advisory given  Plan Discussed with: CRNA  Anesthesia Plan Comments: (2 x PIV)       Anesthesia Quick Evaluation

## 2022-05-26 NOTE — Anesthesia Postprocedure Evaluation (Signed)
Anesthesia Post Note  Patient: [name]  Procedure(s) Performed: EXPLORATORY LAPAROTOMY COLECTOMY WITH COLOSTOMY CREATION/HARTMANN PROCEDURE     Patient location during evaluation: PACU Anesthesia Type: General Level of consciousness: sedated and patient cooperative Pain management: pain level controlled Vital Signs Assessment: post-procedure vital signs reviewed and stable Respiratory status: spontaneous breathing Cardiovascular status: stable Anesthetic complications: no   No notable events documented.  Last Vitals:  Vitals:   05/26/22 1506 05/26/22 2011  BP: (!) 114/50 116/60  Pulse: 74 72  Resp:  18  Temp:  (!) 36.3 C  SpO2: 97% 99%    Last Pain:  Vitals:   05/26/22 2011  TempSrc: Oral  PainSc:                  [name]

## 2022-05-26 NOTE — Consult Note (Signed)
WOC consulted for new ostomy teaching.  [place] nursing will follow up Friday 2/9 for ostomy assessment and needs.   [place], [place], [place]

## 2022-05-26 NOTE — Progress Notes (Signed)
  Progress Note   Patient: [name] DHR:[id number] DOB: 1945-06-29 DOA: 05/24/2022     2 DOS: the patient was seen and examined on 05/26/2022   Brief hospital course: 77 y.o. male with medical history significant of PAF, CAD s/p stent, ICM with EF of 25% as of Nov 2023, PAD s/p aorto bifembypass in 1992 and fem-fem bypass (maybe with fem-pop) by Dr. Donnetta Hutching in 2011, HTN, DM2, COPD quit smoking in 2022.   Pt with diverticulitis with abscess, initially on Apr 08, 2022.  Had IR drain placed.  Seemed to be doing better for month of Jan, drainage had basically stopped.  But then sudden worsening of abd pain and increased drain output saw him admitted to [facility] on 2/1.  IR repositioned drain on 2/2 but had ongoing feculent output despite this and ABx.  Ultimately surgery offered on 2/6 but family wanted him transferred for the surgery to larger hospital.  Pt transferred to [facility]  Assessment and Plan: * Diverticulitis of large intestine with abscess Failed medical treatment and IR drainage attempts x2 with ongoing feculant output around drain, 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 Surgery offered at [facility], however family had requested transfer to larger hospital, thus pt was transferred to [facility] -General Surgery following, now s/p surgery 2/8 -Advance diet per General Surgery   Hypothyroidism Question of new diagnosis of hypothyroidism: -TSH 6.794 with free T4 of 1.31   AKI (acute kidney injury) ([place]) Creat at [facility] during admit: 1.6->1.0-> 1.4 Cr 1.42 this AM -had been continued on basal IVF -Recheck bmet in AM   [place], CHRONIC Holding ARB given AKI Cont BB Reportedly did require dose of lasix for concerns of volume overload on 2/3 at [facility]   DM2 (diabetes mellitus, type 2) (HCC) Cont semglee  5u QHS Glycemic trends stable Sensitive SSI Q4H for the moment.   Paroxysmal atrial fibrillation (HCC) Reportedly ran out of xarelto at home last month -continuing to hold xarelto, last  dose was 2/4 at [facility] -cont heparin gtt for now  CAD with known RCA occlusion -Cardiology following -Recs to eventually d/c on ASA and xarelto when ultimately OK with General Surgery post-op -cont BB -Did have chest pains this afternoon, increased morphine dose and ordered PRN NTG     Subjective: Still feeling groggy post-op this afternoon  Physical Exam: Vitals:   05/26/22 1310 05/26/22 1325 05/26/22 1429 05/26/22 1506  BP: 101/70 (!) 118/56 114/68 (!) 114/50  Pulse: 79 79 78 74  Resp: '16 18 18   '$ Temp:  98.1 F (36.7 C) 97.6 F (36.4 C)   TempSrc:   Oral   SpO2: 93% 94% 97% 97%  Weight:      Height:       General exam: Conversant, in no acute distress Respiratory system: normal chest rise, clear, no audible wheezing Cardiovascular system: regular rhythm, s1-s2 Gastrointestinal system: Nondistended, nontender, pos BS Central nervous system: No seizures, no tremors Extremities: No cyanosis, no joint deformities Skin: No rashes, no pallor Psychiatry: Affect normal // no auditory hallucinations   Data Reviewed:  Labs reviewed: na 133, K 4.5, Cr 1.42, WBC 10.6, Hgb 8.5   Family Communication: Pt in room, family at bedside  Disposition: Status is: Inpatient Remains inpatient appropriate because: Severity of illness  Planned Discharge Destination: Home    Author: Marylu Lund, MD 05/26/2022 4:59 PM  For on call review [email or web address].

## 2022-05-26 NOTE — Progress Notes (Signed)
Floor nurse called for report on patient for ex lap/colectomy surgery and stated that patient was still on a heparin gtt. Transport in patients room. Dr. Bobbye Morton made aware and ordered for the heparin to be stopped asap. Dr. Bobbye Morton gave the okay to transport patient to pre-op for surgery.

## 2022-05-26 NOTE — Anesthesia Procedure Notes (Signed)
Arterial Line Insertion Start/End2/11/2022 9:25 AM, 05/26/2022 9:28 AM Performed by: Colin Benton, CRNA, CRNA  Patient location: Pre-op. Preanesthetic checklist: patient identified, IV checked, site marked, risks and benefits discussed, surgical consent, monitors and equipment checked, pre-op evaluation, timeout performed and anesthesia consent Lidocaine 1% used for infiltration Left, radial was placed Catheter size: 20 G Hand hygiene performed , maximum sterile barriers used  and Seldinger technique used Allen's test indicative of satisfactory collateral circulation Attempts: 1 Procedure performed without using ultrasound guided technique. Following insertion, dressing applied and Biopatch. Post procedure assessment: normal  Patient tolerated the procedure well with no immediate complications.

## 2022-05-26 NOTE — Transfer of Care (Signed)
Immediate Anesthesia Transfer of Care Note  Patient: [name]  Procedure(s) Performed: EXPLORATORY LAPAROTOMY COLECTOMY WITH COLOSTOMY CREATION/HARTMANN PROCEDURE  Patient Location: PACU  Anesthesia Type:General  Level of Consciousness: drowsy and patient cooperative  Airway & Oxygen Therapy: Patient Spontanous Breathing and Patient connected to face mask oxygen  Post-op Assessment: Report given to RN and Post -op Vital signs reviewed and stable  Post vital signs: Reviewed and stable  Last Vitals:  Vitals Value Taken Time  BP 132/64 05/26/22 1254  Temp    Pulse 79 05/26/22 1258  Resp 14 05/26/22 1258  SpO2 96 % 05/26/22 1258  Vitals shown include unvalidated device data.  Last Pain:  Vitals:   05/26/22 0921  TempSrc: Oral  PainSc:       Patients Stated Pain Goal: 0 (16/10/96 0454)  Complications: No notable events documented.

## 2022-05-26 NOTE — Progress Notes (Signed)
General Surgery Follow Up Note  Subjective:    Overnight Issues:   Objective:  Vital signs for last 24 hours: Temp:  [98 F (36.7 C)-99.6 F (37.6 C)] 98.2 F (36.8 C) (02/08 0921) Pulse Rate:  [84-94] 89 (02/08 0921) Resp:  [18-20] 20 (02/08 0921) BP: (89-122)/(48-82) 120/58 (02/08 0921) SpO2:  [83 %-100 %] 95 % (02/08 0829) Weight:  [70.4 kg] 70.4 kg (02/08 0829)  Hemodynamic parameters for last 24 hours:    Intake/Output from previous day: 02/07 0701 - 02/08 0700 In: 2111.9 [I.V.:1383.8; IV Piggyback:708.2] Out: 900 [Urine:600; Drains:300]  Intake/Output this shift: No intake/output data recorded.  Vent settings for last 24 hours:    Physical Exam:  Gen: comfortable, no distress Neuro: non-focal exam HEENT: PERRL Neck: supple CV: RRR Pulm: unlabored breathing Abd: soft, tender, feculent drainage GU: clear yellow urine Extr: wwp, no edema   Results for orders placed or performed during the hospital encounter of 05/24/22 (from the past 24 hour(s))  Glucose, capillary     Status: Abnormal   Collection Time: 05/25/22 11:21 AM  Result Value Ref Range   Glucose-Capillary 128 (H) 70 - 99 mg/dL  Heparin level (unfractionated)     Status: None   Collection Time: 05/25/22  4:31 PM  Result Value Ref Range   Heparin Unfractionated 0.34 0.30 - 0.70 IU/mL  APTT     Status: Abnormal   Collection Time: 05/25/22  4:31 PM  Result Value Ref Range   aPTT 45 (H) 24 - 36 seconds  CBC     Status: Abnormal   Collection Time: 05/25/22  4:31 PM  Result Value Ref Range   WBC 10.0 4.0 - 10.5 K/uL   RBC 3.19 (L) 4.22 - 5.81 MIL/uL   Hemoglobin 8.8 (L) 13.0 - 17.0 g/dL   HCT 27.4 (L) 39.0 - 52.0 %   MCV 85.9 80.0 - 100.0 fL   MCH 27.6 26.0 - 34.0 pg   MCHC 32.1 30.0 - 36.0 g/dL   RDW 17.6 (H) 11.5 - 15.5 %   Platelets 411 (H) 150 - 400 K/uL   nRBC 0.0 0.0 - 0.2 %  Glucose, capillary     Status: Abnormal   Collection Time: 05/25/22  4:47 PM  Result Value Ref Range    Glucose-Capillary 112 (H) 70 - 99 mg/dL  Troponin I (High Sensitivity)     Status: Abnormal   Collection Time: 05/25/22  6:27 PM  Result Value Ref Range   Troponin I (High Sensitivity) 25 (H) <18 ng/L  Troponin I (High Sensitivity)     Status: Abnormal   Collection Time: 05/25/22  7:09 PM  Result Value Ref Range   Troponin I (High Sensitivity) 23 (H) <18 ng/L  Glucose, capillary     Status: Abnormal   Collection Time: 05/25/22  8:37 PM  Result Value Ref Range   Glucose-Capillary 117 (H) 70 - 99 mg/dL  Glucose, capillary     Status: Abnormal   Collection Time: 05/25/22 11:56 PM  Result Value Ref Range   Glucose-Capillary 106 (H) 70 - 99 mg/dL  CBC     Status: Abnormal   Collection Time: 05/26/22  3:47 AM  Result Value Ref Range   WBC 10.6 (H) 4.0 - 10.5 K/uL   RBC 3.07 (L) 4.22 - 5.81 MIL/uL   Hemoglobin 8.5 (L) 13.0 - 17.0 g/dL   HCT 26.2 (L) 39.0 - 52.0 %   MCV 85.3 80.0 - 100.0 fL   MCH 27.7 26.0 -  34.0 pg   MCHC 32.4 30.0 - 36.0 g/dL   RDW 17.8 (H) 11.5 - 15.5 %   Platelets 412 (H) 150 - 400 K/uL   nRBC 0.0 0.0 - 0.2 %  Heparin level (unfractionated)     Status: Abnormal   Collection Time: 05/26/22  3:47 AM  Result Value Ref Range   Heparin Unfractionated 0.15 (L) 0.30 - 0.70 IU/mL  APTT     Status: Abnormal   Collection Time: 05/26/22  3:47 AM  Result Value Ref Range   aPTT 52 (H) 24 - 36 seconds  Comprehensive metabolic panel     Status: Abnormal   Collection Time: 05/26/22  3:47 AM  Result Value Ref Range   Sodium 133 (L) 135 - 145 mmol/L   Potassium 4.5 3.5 - 5.1 mmol/L   Chloride 97 (L) 98 - 111 mmol/L   CO2 20 (L) 22 - 32 mmol/L   Glucose, Bld 134 (H) 70 - 99 mg/dL   BUN 16 8 - 23 mg/dL   Creatinine, Ser 1.42 (H) 0.61 - 1.24 mg/dL   Calcium 8.2 (L) 8.9 - 10.3 mg/dL   Total Protein 5.2 (L) 6.5 - 8.1 g/dL   Albumin 1.9 (L) 3.5 - 5.0 g/dL   AST 16 15 - 41 U/L   ALT 13 0 - 44 U/L   Alkaline Phosphatase 37 (L) 38 - 126 U/L   Total Bilirubin 1.0 0.3 - 1.2  mg/dL   GFR, Estimated 51 (L) >60 mL/min   Anion gap 16 (H) 5 - 15  Glucose, capillary     Status: Abnormal   Collection Time: 05/26/22  4:31 AM  Result Value Ref Range   Glucose-Capillary 136 (H) 70 - 99 mg/dL  Type and screen [place]     Status: None (Preliminary result)   Collection Time: 05/26/22  7:36 AM  Result Value Ref Range   ABO/RH(D) O NEG    Antibody Screen NEG    Sample Expiration      05/29/2022,2359 Performed at [facility], [address], [place], [place] [address]    Unit Number [id number]    Blood Component Type RED CELLS,LR    Unit division 00    Status of Unit ISSUED    Transfusion Status OK TO TRANSFUSE    Crossmatch Result Compatible    Unit Number [id number]    Blood Component Type RED CELLS,LR    Unit division 00    Status of Unit ISSUED    Transfusion Status OK TO TRANSFUSE    Crossmatch Result Compatible    Unit Number [id number]    Blood Component Type RED CELLS,LR    Unit division 00    Status of Unit ALLOCATED    Transfusion Status OK TO TRANSFUSE    Crossmatch Result COMPATIBLE    Unit Number [id number]    Blood Component Type RBC LR PHER1    Unit division 00    Status of Unit ALLOCATED    Transfusion Status OK TO TRANSFUSE    Crossmatch Result COMPATIBLE   Glucose, capillary     Status: Abnormal   Collection Time: 05/26/22  7:44 AM  Result Value Ref Range   Glucose-Capillary 131 (H) 70 - 99 mg/dL  Prepare RBC (crossmatch)     Status: None   Collection Time: 05/26/22  8:40 AM  Result Value Ref Range   Order Confirmation      ORDER PROCESSED BY BLOOD BANK Performed at [facility]  Lab, [address] [place], [place] [address]     Assessment & Plan: The plan of care was discussed with the bedside nurse for the day, who is in agreement with this plan and no additional concerns were raised.   Present on Admission:  Diverticulitis of large intestine with abscess  AKI (acute kidney injury)  ([place])  SYSTOLIC HEART FAILURE, CHRONIC  Paroxysmal atrial fibrillation ([place])  Hypothyroidism    LOS: 2 days   Additional comments:I reviewed the patient's new clinical lab test results.   and I reviewed the patients new imaging test results.    Complicated diverticulitis with coloatmospheric fistula - persistent and worsening abdominal pain. Plan for exlap, partial colectomy, and colostomy. High risk surgical candidate with h/o fem-fem bypass followed by AoBifem, DM, HTN, and HF with EF 25%. ACS risk calculator (below) reviewed in detail with patient and daughter at bedside yesterday. Cardiac risk stratification of high yesterday with peri-op risk of 11% for major cardiac event and no opportunities for pre-op optimization. This was reviewed by me with the patient again this morning. Heparin drip stopped at 0815, will have protamine and blood available. CVC and art line to be placed pre-op by anesthesia.  DVT - SCDs Dispo - [place], MD Trauma & General Surgery Please use [email or web address] to contact on call provider  05/26/2022  *Care during the described time interval was provided by me. I have reviewed this patient's available data, including medical history, events of note, physical examination and test results as part of my evaluation.

## 2022-05-26 NOTE — Anesthesia Procedure Notes (Signed)
Procedure Name: Intubation Date/Time: 05/26/2022 10:16 AM  Performed by: Colin Benton, CRNAPre-anesthesia Checklist: Patient identified, Emergency Drugs available, Suction available and Patient being monitored Patient Re-evaluated:Patient Re-evaluated prior to induction Oxygen Delivery Method: Circle system utilized Preoxygenation: Pre-oxygenation with 100% oxygen Induction Type: IV induction Ventilation: Mask ventilation without difficulty and Oral airway inserted - appropriate to patient size Laryngoscope Size: Sabra Heck and 2 Grade View: Grade I Tube type: Oral Tube size: 7.5 mm Number of attempts: 1 Airway Equipment and Method: Stylet and Oral airway Placement Confirmation: ETT inserted through vocal cords under direct vision, positive ETCO2 and breath sounds checked- equal and bilateral Secured at: 22 cm Tube secured with: Tape Dental Injury: Teeth and Oropharynx as per pre-operative assessment

## 2022-05-26 NOTE — Anesthesia Procedure Notes (Signed)
Central Venous Catheter Insertion Performed by: [name], MD, anesthesiologist Start/End2/11/2022 9:35 AM, 05/26/2022 9:50 AM Patient location: Pre-op. Preanesthetic checklist: patient identified, IV checked, site marked, risks and benefits discussed, surgical consent, monitors and equipment checked, pre-op evaluation, timeout performed and anesthesia consent Position: Trendelenburg Lidocaine 1% used for infiltration and patient sedated Hand hygiene performed  and maximum sterile barriers used  Catheter size: 8 Fr Total catheter length 16. Central line was placed.Double lumen Procedure performed using ultrasound guided technique. Ultrasound Notes:anatomy identified, needle tip was noted to be adjacent to the nerve/plexus identified, no ultrasound evidence of intravascular and/or intraneural injection and image(s) printed for medical record Attempts: 1 Following insertion, dressing applied, line sutured and Biopatch. Post procedure assessment: blood return through all ports, free fluid flow and no air  Patient tolerated the procedure well with no immediate complications.

## 2022-05-26 NOTE — Op Note (Signed)
   Operative Note   Date: 05/26/2022  Procedure: exploratory laparotomy, takedown of splenic flexure, partial colectomy, colostomy creation  Pre-op diagnosis: Hinchey IV diverticulitis, refractory to non-operative management Post-op diagnosis: same  Indication and clinical history: The patient is a 77 y.o. year old male with Hinchey IV diverticulitis, refractory to non-operative management     Surgeon: Jesusita Oka, MD Assistant: Ileene Rubens, [place]  Anesthesiologist: Lissa Hoard, MD Anesthesia: General  Findings:  Specimen: segment of colon  EBL: 50cc Drains/Implants: 45F JP right abdomen, traversing pelvis, left paracolic gutter, terminating in retroperitoneal abscess cavity  Disposition: PACU - hemodynamically stable.  Description of procedure: The patient was positioned supine on the operating room table. General anesthetic induction and intubation were uneventful. Foley catheter insertion was performed and was atraumatic. Time-out was performed verifying correct patient, procedure, signature of informed consent, and administration of pre-operative antibiotics. The abdomen was prepped and draped in the usual sterile fashion.  A midline incision was made and deepened down through the fascia until the peritoneal cavity was entered.  A mild amount of omental adhesions were taken down using a combination of sharp dissection and electrocautery.  The abdomen was explored in its entirety and a nodular mass was noted in the sigmoid colon.  Proximal to this, the colon was densely adherent to the retroperitoneum and pelvic sidewall.  This was freed via blunt dissection.  After mobilization, an abscess cavity was noted of the left retroperitoneum, from which feculent material was grossly visible.  The colon was inspected and a small punctate full-thickness area of perforation was noted that was suspected to be the source of the feculent peritonitis.  The colon was transected distal to the nodular mass in  the sigmoid colon.  The mesentery was transected using the LigaSure device proximally.  The colon was transected just proximal to the area of the previously identified perforation.  Attempts were made to mobilize the colon for primary anastomosis, and this included takedown of splenic flexure.  It was felt that the anastomosis would be under tension without further mobilization and the decision was made to create a colostomy.  A site for the ostomy was identified and created in the left mid abdomen.  The colon was passed through this site.  The abdomen was copiously irrigated.  A 19 [place] Blake drain was placed in the right lower quadrant traversing the pelvis and the left pelvic sidewall, terminating in the abscess cavity.  The midline fascia was closed with #1 looped PDS suture.  The skin was reapproximated with staples with a Penrose drain underlying.  The colostomy was then matured.  Sterile dressings were applied as well as an ostomy appliance.  All sponge and instrument counts were correct at the conclusion of the procedure. The patient was awakened from anesthesia, extubated uneventfully, and transported to the PACU in good condition. There were no complications.   Upon entering the abdomen (organ space), I encountered feculent peritonitis and an abscess cavity in the left retroperioneum.  CASE DATA:  Type of patient?: DOW CASE (Surgical Hospitalist [facility])  Status of Case? URGENT Add On  Infection Present At Time Of Surgery (PATOS)?  FECULENT PERITONITIS and ABSCESS IN THE LEFT RETROPERITONEUM    Jesusita Oka, MD General and [place] [facility]

## 2022-05-27 ENCOUNTER — Encounter (HOSPITAL_COMMUNITY): Payer: Self-pay | Admitting: Surgery

## 2022-05-27 DIAGNOSIS — I2510 Atherosclerotic heart disease of native coronary artery without angina pectoris: Secondary | ICD-10-CM | POA: Diagnosis not present

## 2022-05-27 DIAGNOSIS — I480 Paroxysmal atrial fibrillation: Secondary | ICD-10-CM

## 2022-05-27 DIAGNOSIS — K5720 Diverticulitis of large intestine with perforation and abscess without bleeding: Secondary | ICD-10-CM | POA: Diagnosis not present

## 2022-05-27 DIAGNOSIS — R0781 Pleurodynia: Secondary | ICD-10-CM | POA: Diagnosis not present

## 2022-05-27 DIAGNOSIS — Z01818 Encounter for other preprocedural examination: Secondary | ICD-10-CM | POA: Diagnosis not present

## 2022-05-27 LAB — GLUCOSE, CAPILLARY
Glucose-Capillary: 210 mg/dL — ABNORMAL HIGH (ref 70–99)
Glucose-Capillary: 170 mg/dL — ABNORMAL HIGH (ref 70–99)
Glucose-Capillary: 168 mg/dL — ABNORMAL HIGH (ref 70–99)
Glucose-Capillary: 156 mg/dL — ABNORMAL HIGH (ref 70–99)
Glucose-Capillary: 263 mg/dL — ABNORMAL HIGH (ref 70–99)
Glucose-Capillary: 322 mg/dL — ABNORMAL HIGH (ref 70–99)

## 2022-05-27 LAB — CBC
HCT: 33.1 % — ABNORMAL LOW (ref 39.0–52.0)
Hemoglobin: 11.1 g/dL — ABNORMAL LOW (ref 13.0–17.0)
MCH: 28.8 pg (ref 26.0–34.0)
MCHC: 33.5 g/dL (ref 30.0–36.0)
MCV: 85.8 fL (ref 80.0–100.0)
Platelets: 395 10*3/uL (ref 150–400)
RBC: 3.86 MIL/uL — ABNORMAL LOW (ref 4.22–5.81)
RDW: 16.8 % — ABNORMAL HIGH (ref 11.5–15.5)
WBC: 9.9 10*3/uL (ref 4.0–10.5)
nRBC: 0 % (ref 0.0–0.2)

## 2022-05-27 LAB — COMPREHENSIVE METABOLIC PANEL
ALT: 11 U/L (ref 0–44)
AST: 15 U/L (ref 15–41)
Albumin: 2.4 g/dL — ABNORMAL LOW (ref 3.5–5.0)
Alkaline Phosphatase: 29 U/L — ABNORMAL LOW (ref 38–126)
Anion gap: 15 (ref 5–15)
BUN: 18 mg/dL (ref 8–23)
CO2: 18 mmol/L — ABNORMAL LOW (ref 22–32)
Calcium: 8 mg/dL — ABNORMAL LOW (ref 8.9–10.3)
Chloride: 99 mmol/L (ref 98–111)
Creatinine, Ser: 1.37 mg/dL — ABNORMAL HIGH (ref 0.61–1.24)
GFR, Estimated: 53 mL/min — ABNORMAL LOW (ref >60)
Glucose, Bld: 203 mg/dL — ABNORMAL HIGH (ref 70–99)
Potassium: 4.1 mmol/L (ref 3.5–5.1)
Sodium: 132 mmol/L — ABNORMAL LOW (ref 135–145)
Total Bilirubin: 1.5 mg/dL — ABNORMAL HIGH (ref 0.3–1.2)
Total Protein: 5.4 g/dL — ABNORMAL LOW (ref 6.5–8.1)

## 2022-05-27 LAB — ANTITHROMBIN III: AntiThromb III Func: 40 % — ABNORMAL LOW (ref 75–120)

## 2022-05-27 LAB — HEPARIN LEVEL (UNFRACTIONATED)
Heparin Unfractionated: 0.1 IU/mL — ABNORMAL LOW (ref 0.30–0.70)
Heparin Unfractionated: 0.11 IU/mL — ABNORMAL LOW (ref 0.30–0.70)

## 2022-05-27 LAB — T3: T3, Total: 78 ng/dL (ref 71–180)

## 2022-05-27 LAB — APTT: aPTT: 73 seconds — ABNORMAL HIGH (ref 24–36)

## 2022-05-27 LAB — PREALBUMIN: Prealbumin: <5 mg/dL — ABNORMAL LOW (ref 18–38)

## 2022-05-27 MED ORDER — METHOCARBAMOL 500 MG PO TABS
500.0000 mg | ORAL_TABLET | Freq: Four times a day (QID) | ORAL | Status: DC
  Administered 2022-05-27 – 2022-06-03 (×28): 500 mg via ORAL
  Filled 2022-05-27 (×28): qty 1

## 2022-05-27 MED ORDER — METHOCARBAMOL 1000 MG/10ML IJ SOLN
500.0000 mg | Freq: Four times a day (QID) | INTRAVENOUS | Status: DC
  Administered 2022-05-27: 500 mg via INTRAVENOUS
  Filled 2022-05-27 (×3): qty 5
  Filled 2022-05-27: qty 500

## 2022-05-27 MED ORDER — ARGATROBAN 50 MG/50ML IV SOLN
0.8000 ug/kg/min | INTRAVENOUS | Status: DC
  Administered 2022-05-27: 1 ug/kg/min via INTRAVENOUS
  Administered 2022-05-28 (×2): 0.8 ug/kg/min via INTRAVENOUS
  Administered 2022-05-29 – 2022-05-30 (×2): 0.72 ug/kg/min via INTRAVENOUS
  Filled 2022-05-27 (×6): qty 50

## 2022-05-27 MED ORDER — BOOST / RESOURCE BREEZE PO LIQD CUSTOM
1.0000 | Freq: Three times a day (TID) | ORAL | Status: DC
  Administered 2022-05-27 – 2022-05-30 (×7): 1 via ORAL

## 2022-05-27 MED ORDER — ACETAMINOPHEN 500 MG PO TABS
1000.0000 mg | ORAL_TABLET | Freq: Four times a day (QID) | ORAL | Status: DC
  Administered 2022-05-27 – 2022-06-03 (×27): 1000 mg via ORAL
  Filled 2022-05-27 (×28): qty 2

## 2022-05-27 NOTE — Consult Note (Addendum)
[place] Nurse ostomy consult note 05/26/2022 exp lap w/partial colectomy and colostomy creation  Stoma type/location: LLQ colostomy  Stomal assessment/size: 1 1/4" edematous slightly above skin level, round  Peristomal assessment: intact  Treatment options for stomal/peristomal skin: 2" barrier ring Kellie Simmering [phone]) Output 15 mls bloody fluid  Ostomy pouching: 2 piece 2 1/4" skin barrier Kellie Simmering [phone]), 2 1/4" pouch Kellie Simmering [phone])  Education provided: Patient alone in room at this visit.  Patient does state he has a daughter he would like [place] to contact for future education sessions.  Educated patient on the importance of emptying pouch when 1/3 to 1/2 full.  Demonstrated to patient how to utilize lock and roll mechanism to open pouch, use of wick to clean spout and how to close pouch.  Patient was able to return demonstration of opening and closing pouch.  Educated patient on removal of old pouch using push pull method and cleaning around stoma with water moistened washcloth, no soaps or baby wipes due to risk of residue around stoma.  I demonstrated how to measure stoma, cut skin barrier to fit, place a barrier ring and place new skin barrier around stoma.  Demonstrated how to attach pouch to skin barrier.    Discussed with patient that he can shower with pouch on or if it is a day to change the pouching system he can take pouch off and shower without a pouch on being sure to rinse any soap residue from around stoma thoroughly.  Discussed that the entire pouching system will need to be changed twice weekly or more often if skin barrier leaks.  Patient was very attentive during education session but wanted me to call his daughter to set up future sessions.  Voice mail left for daughter Crystal at [phone].   Enrolled patient in [place] program: no   WOC will follow this patient for ostomy education and support.   Thank you,    Anjeanette Petzold MSN, RN-BC, [employer]

## 2022-05-27 NOTE — Progress Notes (Signed)
Mobility Specialist Progress Note   05/27/22 1547  Mobility  Activity Ambulated with assistance in hallway  Level of Assistance +2 (takes two people) (Chair follow)  Assistive Device Front wheel walker  Distance Ambulated (ft) 80 ft  Activity Response Tolerated fair  Mobility Referral Yes  $Mobility charge 1 Mobility   Post Mobility: 76 HR, 133/58 BP, 95% SpO2  Received pt in bed c/o incisional pain (5/10) but agreeable to mobility. MinA to EOB d/t decreased pain tolerance and  decreased trunk strength, minA to stand. Ambulation limited by shooting pain in abdominals causing pt to buckle. Requested pt to sit down; while sitting pt began to look feint and started responding less to verbal cues. Rolled back to room to check vitals, VSS. Left pt in chair w/ family present and notified RN.   [place] F[place] Mobility Specialist Please contact via SecureChat or  Rehab office at [phone]

## 2022-05-27 NOTE — Progress Notes (Signed)
Pharmacy Antibiotic Note  [name] is a 77 y.o. male admitted on 05/24/2022 with diverticulitis with retroperitoneal abscess and feculent perotinitis. Pharmacy has been consulted for Zosyn dosing. Pt is s/p ex/lap with coloatmospheric fistula, JP drain placement in retroperitoneal abscess, and colostomy formation on 05/26/22.   WBC down to 9.9, remains afebrile SCr trending down to 1.37 (baseline SCr ~1)  Plan: Continue Zosyn 3.375g IV q24h extended-infusion through 05/30/22  Monitor daily CBC, temp, SCr, and for clinical signs of improvement   Height: 5' 7"$  (170.2 cm) Weight: 70.4 kg (155 lb 3.3 oz) IBW/kg (Calculated) : 66.1  Temp (24hrs), Avg:97.7 F (36.5 C), Min:97.3 F (36.3 C), Max:98.1 F (36.7 C)  Recent Labs  Lab 05/25/22 0712 05/25/22 1631 05/26/22 0347 05/27/22 0325  WBC 12.4* 10.0 10.6* 9.9  CREATININE 1.47*  --  1.42* 1.37*    Estimated Creatinine Clearance: 42.2 mL/min (A) (by C-G formula based on SCr of 1.37 mg/dL (H)).    Allergies  Allergen Reactions   Lisinopril Swelling and Rash    Rash - face and tounge swell    Antimicrobials this admission: Zosyn 2/7 >> (2/12)  Dose adjustments this admission: None  Microbiology results: 2/6 MRSA PCR: not detected  Thank you for allowing pharmacy to be a part of this patient's care.  Luisa Hart, PharmD, BCPS Clinical Pharmacist 05/27/2022 11:56 AM   Please refer to [place] for pharmacy phone number

## 2022-05-27 NOTE — Progress Notes (Signed)
Rounding Note    Patient Name: [name] Date of Encounter: 05/27/2022  [place] Cardiologist: Jenne Campus, MD   Subjective   Patient states he is feeling well, had surgery yesterday, walked with PT in the hall, felt slight SOB, denied any chest pain or resting SOB.   Inpatient Medications    Scheduled Meds:  sodium chloride   Intravenous Once   carvedilol  12.5 mg Oral BID WC   Chlorhexidine Gluconate Cloth  6 each Topical Daily   insulin aspart  0-9 Units Subcutaneous Q4H   insulin glargine-yfgn  5 Units Subcutaneous QHS   sodium chloride flush  10 mL Intracatheter Q8H   Continuous Infusions:  acetaminophen 1,000 mg (05/27/22 0529)   heparin 2,000 Units/hr (05/27/22 0527)   lactated ringers 75 mL/hr at 05/27/22 1022   methocarbamol (ROBAXIN) IV     piperacillin-tazobactam (ZOSYN)  IV 3.375 g (05/27/22 0000)   PRN Meds: acetaminophen **OR** acetaminophen, melatonin, morphine injection, nitroGLYCERIN, ondansetron **OR** ondansetron (ZOFRAN) IV   Vital Signs    Vitals:   05/26/22 2011 05/27/22 0420 05/27/22 0823 05/27/22 0839  BP: 116/60 (!) 113/58  (!) 106/58  Pulse: 72 70 66 71  Resp: 18 18    Temp: (!) 97.3 F (36.3 C) (!) 97.3 F (36.3 C)    TempSrc: Oral Oral    SpO2: 99% 95% 100%   Weight:      Height:        Intake/Output Summary (Last 24 hours) at 05/27/2022 1026 Last data filed at 05/27/2022 0800 Gross per 24 hour  Intake 4128.29 ml  Output 2115 ml  Net 2013.29 ml      05/26/2022    8:29 AM 05/24/2022    8:00 PM 05/24/2022    7:52 PM  Last 3 Weights  Weight (lbs) 155 lb 3.3 oz 155 lb 3.3 oz 155 lb 3.3 oz  Weight (kg) 70.4 kg 70.4 kg 70.4 kg      Telemetry    Sinus rhythm with PVCs  - Personally Reviewed  ECG    N/A today  - Personally Reviewed  Physical Exam   GEN: No acute distress.   Neck: R IJ with dressing, no significant JVD Cardiac: RRR, no murmurs, rubs, or gallops.  Respiratory: Clear to auscultation  bilaterally. On room air GI: Bile color drain noted in collection bag, abdominal exam deferred  MS: No leg edema, SCDs in place  Neuro:  Nonfocal  Psych: Normal affect   Labs    High Sensitivity Troponin:   Recent Labs  Lab 05/25/22 1827 05/25/22 1909  TROPONINIHS 25* 23*     Chemistry Recent Labs  Lab 05/25/22 0712 05/26/22 0347 05/26/22 1125 05/27/22 0325  NA 131* 133* 134* 132*  K 3.2* 4.5 4.3 4.1  CL 95* 97*  --  99  CO2 23 20*  --  18*  GLUCOSE 122* 134*  --  203*  BUN 15 16  --  18  CREATININE 1.47* 1.42*  --  1.37*  CALCIUM 7.9* 8.2*  --  8.0*  PROT 5.3* 5.2*  --  5.4*  ALBUMIN 2.0* 1.9*  --  2.4*  AST 17 16  --  15  ALT 10 13  --  11  ALKPHOS 40 37*  --  29*  BILITOT 0.7 1.0  --  1.5*  GFRNONAA 49* 51*  --  53*  ANIONGAP 13 16*  --  15    Lipids No results for input(s): "CHOL", "TRIG", "  HDL", "LABVLDL", "[place]", "CHOLHDL" in the last 168 hours.  Hematology Recent Labs  Lab 05/25/22 1631 05/26/22 0347 05/26/22 1125 05/27/22 0325  WBC 10.0 10.6*  --  9.9  RBC 3.19* 3.07*  --  3.86*  HGB 8.8* 8.5* 9.9* 11.1*  HCT 27.4* 26.2* 29.0* 33.1*  MCV 85.9 85.3  --  85.8  MCH 27.6 27.7  --  28.8  MCHC 32.1 32.4  --  33.5  RDW 17.6* 17.8*  --  16.8*  PLT 411* 412*  --  395   Thyroid  Recent Labs  Lab 05/24/22 2210  TSH 6.794*  FREET4 1.31*    BNPNo results for input(s): "BNP", "PROBNP" in the last 168 hours.  DDimer No results for input(s): "DDIMER" in the last 168 hours.   Radiology    DG CHEST PORT 1 VIEW  Result Date: 05/26/2022 CLINICAL DATA:  Central line placement. EXAM: PORTABLE CHEST 1 VIEW COMPARISON:  05/21/2022 FINDINGS: Interval placement of right IJ central venous catheter with tip over the SVC. Lungs are adequately inflated with minimal hazy prominence of the central perihilar vessels suggesting mild degree of vascular congestion. No airspace consolidation or effusion. No pneumothorax. Cardiomediastinal silhouette and remainder of the  exam is unchanged. IMPRESSION: 1. Suggestion of minimal vascular congestion. 2. Right IJ central venous catheter with tip over the SVC. Electronically Signed   By: Marin Olp M.D.   On: 05/26/2022 14:11   CT Angio Chest Pulmonary Embolism (PE) W or WO Contrast  Result Date: 05/25/2022 CLINICAL DATA:  Pulmonary embolus suspected with high probability. EXAM: CT ANGIOGRAPHY CHEST WITH CONTRAST TECHNIQUE: Multidetector CT imaging of the chest was performed using the standard protocol during bolus administration of intravenous contrast. Multiplanar CT image reconstructions and MIPs were obtained to evaluate the vascular anatomy. RADIATION DOSE REDUCTION: This exam was performed according to the departmental dose-optimization program which includes automated exposure control, adjustment of the mA and/or kV according to patient size and/or use of iterative reconstruction technique. CONTRAST:  26m OMNIPAQUE IOHEXOL 350 MG/ML SOLN COMPARISON:  11/30/2017 FINDINGS: Cardiovascular: Good opacification of the central and segmental pulmonary arteries. No focal filling defects. No evidence of significant pulmonary embolus. Normal heart size. No pericardial effusions. Normal caliber thoracic aorta. Calcification of the aorta and coronary arteries. Mediastinum/Nodes: Thyroid gland is unremarkable. Esophagus is decompressed. No significant lymphadenopathy. Lungs/Pleura: Small bilateral pleural effusions. Atelectasis in the lung bases is likely compressive. Emphysematous changes in the lungs. Bronchial wall thickening likely representing chronic bronchitis. Upper Abdomen: Cholelithiasis with small stones in the gallbladder. No gallbladder wall thickening. Musculoskeletal: Degenerative changes in the spine. Review of the MIP images confirms the above findings. IMPRESSION: 1. No evidence of significant pulmonary embolus. 2. Small bilateral pleural effusions with basilar atelectasis. 3. Cholelithiasis. Electronically Signed   By:  WLucienne CapersM.D.   On: 05/25/2022 22:08    Cardiac Studies   Echo from 02/28/22:  1. Left ventricular ejection fraction, by estimation, is 60 to 65%. Left  ventricular ejection fraction by PLAX is 25 %. The left ventricle has  severely decreased function. The left ventricle demonstrates global  hypokinesis. The left ventricular internal   cavity size was mildly dilated. Left ventricular diastolic parameters are  consistent with Grade II diastolic dysfunction (pseudonormalization).  Elevated left atrial pressure. The average left ventricular global  longitudinal strain is -4.9 %. The global  longitudinal strain is abnormal.   2. Right ventricular systolic function is mildly reduced. The right  ventricular size is normal. There is  normal pulmonary artery systolic  pressure.   3. Left atrial size was mildly dilated.   4. The mitral valve is degenerative. Mild mitral valve regurgitation. No  evidence of mitral stenosis.   5. The aortic valve is normal in structure. Aortic valve regurgitation is  not visualized. No aortic stenosis is present.   6. Aortic Normal DTA.   7. The inferior vena cava is normal in size with greater than 50%  respiratory variability, suggesting right atrial pressure of 3 mmHg.   Echo from 02/09/18:  - Left ventricle: The cavity size was mildly dilated. Wall    thickness was normal. Systolic function was severely reduced. The    estimated ejection fraction was in the range of 25% to 30%.    Diffuse hypokinesis with regional variation (worse inferiorly).    Doppler parameters are consistent with diastolic dysfunction and    elevated LV filling pressure.  - Aortic valve: Calcified leaflets. Trileaflet. No stenosis of    regurgitation.  - Mitral valve: Mildly thickened leaflets . There was mild    regurgitation.  - Left atrium: Moderately dilated.  - Right atrium: The atrium was mildly dilated.  - Inferior vena cava: The vessel was dilated. The  respirophasic    diameter changes were blunted (< 50%), consistent with elevated    central venous pressure.   Impressions:   - Compared to a prior study in 09/2017, there has been no    significant changes.   R/LHC on 10/16/17:  Prox RCA lesion is 100% stenosed. Mid RCA to Dist RCA lesion is 100% stenosed. Ost LM to Mid LM lesion is 30% stenosed. Ost LAD to Prox LAD lesion is 90% stenosed. A drug-eluting stent was successfully placed using a STENT SYNERGY DES 3X28. Post intervention, there is a 0% residual stenosis. LV end diastolic pressure is normal.   1. Chronic occlusion RCA with distal vessel filling from left to right collaterals.  2. Severe stenosis proximal LAD in the old stented segment (bare metal stent was placed in 2000).  3. Successful PTCA/DES x  Proximal LAD 4. Normal filling pressures   Recommendations: Will continue ASA and Plavix for one month along with Xarelto. Restart Xarelto in am. Will use ASA for only one month then will stop and continue Plavix with Xarelto for one year. Continue beta blocker and statin. Likely d/c home tomorrow am.     Patient Profile     77 y.o. male with PMH of CAD s/p stents and CTO of RCA, paroxysmal atrial fibrillation on Xarelto, COPD, chronic systolic heart failure, ischemic cardiomyopathy, hyperlipidemia, hypertension, diabetes mellitus, ongoing tobacco abuse and PAD, who is admitted for diverticulitis of large intestine with abscess, cardiology was consulted for pre-op risk evaluation.  Assessment & Plan    Chronic systolic heart failure Ischemic cardiomyopathy  - Echo last done 11/23 with LVEF 25%, grade II DD, mild RV dysfunction with mild MR  - POD#1 of exploratory laparotomy, takedown of splenic flexure, partial colectomy, colostomy creation  - CXR 2/8 showed minimal vascular congestion  - clinically euvolemic today  - please trac daily weight, I&O, s/s of volume overload , judicious use with IVF  - GDMT: continue Coreg,  PTA entresto held due to AKI, consider SGLT2I and MRA if able; declined ICD in the past   CAD with hx of stents - had pleuritic chest pain before OR, EKG no acute findings,  CTA chest negative for PE, chest pain has resolved, may be referred pain from abdomen  -  continue medical therapy: coreg, ASA and crestor currently held, may resume when able   Paroxysmal atrial fibrillation  - currently in SR, PVCs - continue coreg - Xarelto held for surgery, currently on heparin gtt, CHA2DS2 of 6  HTN - BP low normal, tolerating coreg   For questions or updates, please contact [place] Please consult [email or web address] for contact info under        Signed, Margie Billet, NP  05/27/2022, 10:26 AM

## 2022-05-27 NOTE — Progress Notes (Signed)
General Surgery Follow Up Note  Subjective:    Overnight Issues: up in chair and just got back from walking with PT down the hall.  Wanting some liquids.  No nausea.  Pain seems well controlled.    Objective:  Vital signs for last 24 hours: Temp:  [97.3 F (36.3 C)-98.1 F (36.7 C)] 97.3 F (36.3 C) (02/09 0420) Pulse Rate:  [66-79] 71 (02/09 0839) Resp:  [16-18] 18 (02/09 0420) BP: (101-132)/(50-70) 106/58 (02/09 0839) SpO2:  [93 %-100 %] 100 % (02/09 0823) Arterial Line BP: (121-140)/(43-56) 121/43 (02/08 1325)   Intake/Output from previous day: 02/08 0701 - 02/09 0700 In: 4128.3 [I.V.:2593.3; Blood:315; IV Piggyback:1200] Out: 2115 [Urine:1465; Drains:450; Blood:200]  Intake/Output this shift: No intake/output data recorded.  Physical Exam:  Gen: comfortable, no distress CV: RRR Pulm: unlabored breathing Abd: soft, appropriately tender tender, IR drain in place with some bloody, cloudy output, 160cc/24hrs.  JP with bloody output, 290cc/24hrs.  Midline wound clean and staples as well as penrose drain in place.  Colostomy viable with scant serosang output GU: clear yellow urine   Results for orders placed or performed during the hospital encounter of 05/24/22 (from the past 24 hour(s))  I-STAT 7, (LYTES, BLD GAS, ICA, H+H)     Status: Abnormal   Collection Time: 05/26/22 11:25 AM  Result Value Ref Range   pH, Arterial 7.317 (L) 7.35 - 7.45   pCO2 arterial 38.6 32 - 48 mmHg   pO2, Arterial 93 83 - 108 mmHg   Bicarbonate 19.7 (L) 20.0 - 28.0 mmol/L   TCO2 21 (L) 22 - 32 mmol/L   O2 Saturation 97 %   Acid-base deficit 6.0 (H) 0.0 - 2.0 mmol/L   Sodium 134 (L) 135 - 145 mmol/L   Potassium 4.3 3.5 - 5.1 mmol/L   Calcium, Ion 1.19 1.15 - 1.40 mmol/L   HCT 29.0 (L) 39.0 - 52.0 %   Hemoglobin 9.9 (L) 13.0 - 17.0 g/dL   Sample type ARTERIAL   Glucose, capillary     Status: Abnormal   Collection Time: 05/26/22 12:55 PM  Result Value Ref Range   Glucose-Capillary 175  (H) 70 - 99 mg/dL  Glucose, capillary     Status: Abnormal   Collection Time: 05/26/22  2:21 PM  Result Value Ref Range   Glucose-Capillary 182 (H) 70 - 99 mg/dL  Glucose, capillary     Status: Abnormal   Collection Time: 05/26/22  4:27 PM  Result Value Ref Range   Glucose-Capillary 207 (H) 70 - 99 mg/dL  Glucose, capillary     Status: Abnormal   Collection Time: 05/26/22  8:12 PM  Result Value Ref Range   Glucose-Capillary 169 (H) 70 - 99 mg/dL  Glucose, capillary     Status: Abnormal   Collection Time: 05/27/22  1:19 AM  Result Value Ref Range   Glucose-Capillary 210 (H) 70 - 99 mg/dL  Heparin level (unfractionated)     Status: Abnormal   Collection Time: 05/27/22  3:25 AM  Result Value Ref Range   Heparin Unfractionated 0.10 (L) 0.30 - 0.70 IU/mL  Prealbumin     Status: Abnormal   Collection Time: 05/27/22  3:25 AM  Result Value Ref Range   Prealbumin <5 (L) 18 - 38 mg/dL  Comprehensive metabolic panel     Status: Abnormal   Collection Time: 05/27/22  3:25 AM  Result Value Ref Range   Sodium 132 (L) 135 - 145 mmol/L   Potassium 4.1 3.5 - 5.1  mmol/L   Chloride 99 98 - 111 mmol/L   CO2 18 (L) 22 - 32 mmol/L   Glucose, Bld 203 (H) 70 - 99 mg/dL   BUN 18 8 - 23 mg/dL   Creatinine, Ser 1.37 (H) 0.61 - 1.24 mg/dL   Calcium 8.0 (L) 8.9 - 10.3 mg/dL   Total Protein 5.4 (L) 6.5 - 8.1 g/dL   Albumin 2.4 (L) 3.5 - 5.0 g/dL   AST 15 15 - 41 U/L   ALT 11 0 - 44 U/L   Alkaline Phosphatase 29 (L) 38 - 126 U/L   Total Bilirubin 1.5 (H) 0.3 - 1.2 mg/dL   GFR, Estimated 53 (L) >60 mL/min   Anion gap 15 5 - 15  CBC     Status: Abnormal   Collection Time: 05/27/22  3:25 AM  Result Value Ref Range   WBC 9.9 4.0 - 10.5 K/uL   RBC 3.86 (L) 4.22 - 5.81 MIL/uL   Hemoglobin 11.1 (L) 13.0 - 17.0 g/dL   HCT 33.1 (L) 39.0 - 52.0 %   MCV 85.8 80.0 - 100.0 fL   MCH 28.8 26.0 - 34.0 pg   MCHC 33.5 30.0 - 36.0 g/dL   RDW 16.8 (H) 11.5 - 15.5 %   Platelets 395 150 - 400 K/uL   nRBC 0.0 0.0 -  0.2 %  Glucose, capillary     Status: Abnormal   Collection Time: 05/27/22  4:21 AM  Result Value Ref Range   Glucose-Capillary 170 (H) 70 - 99 mg/dL  Glucose, capillary     Status: Abnormal   Collection Time: 05/27/22  7:30 AM  Result Value Ref Range   Glucose-Capillary 168 (H) 70 - 99 mg/dL    Assessment & Plan:   LOS: 3 days  POD 1, s/p ex lap with Hartmann's procedure forComplicated diverticulitis with coloatmospheric fistula by Dr. Bobbye Morton 2/8- -IR drain still in place -surgical drain in place -WOC following for new colostomy -will allow clear liquids today -mobilize with PT/OT -cont IV abx therapy -WBC nl -prealbumin - <5.  Add resource breeze  FEN - CLD, resource breeze, IVFs per primary DVT - SCDs, heparin gtt ID - [email or web address]   Henreitta Cea, PA-C General Surgery Please use [email or web address] to contact on call provider  05/27/2022  *Care during the described time interval was provided by me. I have reviewed this patient's available data, including medical history, events of note, physical examination and test results as part of my evaluation.

## 2022-05-27 NOTE — Progress Notes (Signed)
[place] for heparin -> argatroban Indication: atrial fibrillation   Allergies  Allergen Reactions   Lisinopril Swelling and Rash    Rash - face and tounge swell    Patient Measurements: Height: 5' 7"$  (170.2 cm) Weight: 70.4 kg (155 lb 3.3 oz) IBW/kg (Calculated) : 66.1  Vital Signs: Temp: 98.2 F (36.8 C) (02/09 1742) Temp Source: Oral (02/09 1301) BP: 119/69 (02/09 1742) Pulse Rate: 73 (02/09 1742)  Labs: Recent Labs    05/25/22 0712 05/25/22 1631 05/25/22 1827 05/25/22 1909 05/26/22 0347 05/26/22 1125 05/27/22 0325 05/27/22 1157  HGB 8.6* 8.8*  --   --  8.5* 9.9* 11.1*  --   HCT 26.0* 27.4*  --   --  26.2* 29.0* 33.1*  --   PLT 348 411*  --   --  412*  --  395  --   APTT 42* 45*  --   --  52*  --   --   --   HEPARINUNFRC 0.63 0.34  --   --  0.15*  --  0.10* 0.11*  CREATININE 1.47*  --   --   --  1.42*  --  1.37*  --   TROPONINIHS  --   --  25* 23*  --   --   --   --      Estimated Creatinine Clearance: 42.2 mL/min (A) (by C-G formula based on SCr of 1.37 mg/dL (H)).   Assessment: 77 y.o. male with h/o Afib, Xarelto on hold and s/p ex-lap colectomy/colostomy. Pharmacy consulted to dose heparin infusion. Heparin level remained subtherapeutic on ~30 units/kg/hr so antithrombin III level checked and it was 40 (low) indicating heparin resistance so decision made to change to argatroban. aPTT 40 on admission. CBC stable.  Goal of Therapy:  aPTT 50-90 sec Monitor platelets by anticoagulation protocol: Yes   Plan:  D/c heparin when argatroban started Start argatroban 1 mcg/kg/min F/u 4 hr aPTT until therapeutic x 2 then daily Daily CBC  Sherlon Handing, PharmD, BCPS Please see amion for complete clinical pharmacist phone list 05/27/2022 6:08 PM

## 2022-05-27 NOTE — TOC Progression Note (Signed)
Transition of Care [facility]) - Initial/Assessment Note    Patient Details  Name: [name] MRN: [id number] Date of Birth: 24-May-1945  [facility]) CM/SW Contact:    Milinda Antis, [place] Phone Number: 05/27/2022, 2:51 PM  Clinical Narrative:                 CSW received consult for possible SNF placement at time of discharge. CSW spoke with patient.  Patient expressed understanding of PT recommendation and reports wanting to see how he progresses with PT prior to making a final decision of SNF verses home health.    TOC following   Barriers to Discharge: Continued Medical Work up   Patient Goals and CMS Choice Patient states their goals for this hospitalization and ongoing recovery are:: To return home CMS Medicare.gov Compare Post Acute Care list provided to:: Patient Choice offered to / list presented to : Patient, Adult Children (Daughter, Teacher, music)      Expected Discharge Plan and Services   Discharge Planning Services: CM Consult   Living arrangements for the past 2 months: Single Family Home                                      Prior Living Arrangements/Services Living arrangements for the past 2 months: Single Family Home Lives with:: Self Patient language and need for interpreter reviewed:: Yes Do you feel safe going back to the place where you live?: Yes      Need for Family Participation in Patient Care: Yes (Comment) Care giver support system in place?: Yes (comment)   Criminal Activity/Legal Involvement Pertinent to Current Situation/Hospitalization: No - Comment as needed  Activities of Daily Living Home Assistive Devices/Equipment: Dentures (specify type) ADL Screening (condition at time of admission) Patient's cognitive ability adequate to safely complete daily activities?: Yes Is the patient deaf or have difficulty hearing?: Yes Does the patient have difficulty seeing, even when wearing glasses/contacts?: No Does the patient have  difficulty concentrating, remembering, or making decisions?: No Patient able to express need for assistance with ADLs?: Yes Does the patient have difficulty dressing or bathing?: No Independently performs ADLs?: Yes (appropriate for developmental age) Does the patient have difficulty walking or climbing stairs?: Yes Weakness of Legs: Both Weakness of Arms/Hands: None  Permission Sought/Granted Permission sought to share information with : Case Manager, Family Supports Permission granted to share information with : Yes, Verbal Permission Granted              Emotional Assessment Appearance:: Appears stated age Attitude/Demeanor/Rapport: Engaged, Gracious Affect (typically observed): Accepting, Appropriate, Calm, Hopeful, Pleasant Orientation: : Oriented to Self, Oriented to Place, Oriented to  Time, Oriented to Situation Alcohol / Substance Use: Not Applicable Psych Involvement: No (comment)  Admission diagnosis:  Diverticulitis of large intestine with abscess [K57.20] Patient Active Problem List   Diagnosis Date Noted   Diverticulitis of large intestine with abscess 05/24/2022   AKI (acute kidney injury) ([place]) 05/24/2022   Hypothyroidism 05/24/2022   Need for prophylactic vaccination and inoculation against influenza 03/28/2022   Lethargy 03/28/2022   Current moderate episode of major depressive disorder without prior episode ([place]) 03/28/2022   Bladder cancer ([place]) [id number]   Chronic systolic (congestive) heart failure ([place]) 02/09/2022   Cigarette smoker 02/09/2022   Coronary artery disease 02/09/2022   Ischemic cardiomyopathy 02/09/2022   Lumbar spinal stenosis 02/09/2022   Myocardial infarction ([place]) 02/09/2022  NSVT (nonsustained ventricular tachycardia) ([facility]) 02/09/2022   PAF (paroxysmal atrial fibrillation) ([place]) 02/09/2022   PVC's (premature ventricular contractions) 02/09/2022   DM2 (diabetes mellitus, type 2) ([place]) 02/09/2022   Diverticulitis large intestine  05/11/2021   Status post coronary artery stent placement    Unstable angina ([place]) 10/16/2017   Coronary artery disease involving native coronary artery of native heart with unstable angina pectoris ([place])    Acute low back pain 01/12/2015   Atherosclerosis of native arteries of the extremities with intermittent claudication 08/13/2013   Backache 02/08/2013   TOBACCO ABUSE 05/29/2009   HYPERLIPIDEMIA-MIXED 09/22/2008   HYPERTENSION, BENIGN 09/22/2008   CAD, NATIVE VESSEL 0000000   SYSTOLIC HEART FAILURE, CHRONIC 09/22/2008   PVD 09/22/2008   PCP:  Garwin Brothers, MD Pharmacy:   [facility], [address] AT [place] [id number] [place] [address] Phone: [phone] Fax: [phone]     Social Determinants of Health (SDOH) Social History: SDOH Screenings   Food Insecurity: No Food Insecurity (05/24/2022)  Housing: Low Risk  (05/24/2022)  Transportation Needs: No Transportation Needs (05/24/2022)  Utilities: Not At Risk (05/24/2022)  Depression (PHQ2-9): High Risk (03/28/2022)  Tobacco Use: Medium Risk (05/27/2022)   SDOH Interventions: Transportation Interventions: Intervention Not Indicated, Inpatient TOC, Patient Resources (Friends/Family)   Readmission Risk Interventions     No data to display

## 2022-05-27 NOTE — Progress Notes (Signed)
[place] for heparin -> argatroban Indication: atrial fibrillation   Allergies  Allergen Reactions   Lisinopril Swelling and Rash    Rash - face and tounge swell    Patient Measurements: Height: 5' 7"$  (170.2 cm) Weight: 70.4 kg (155 lb 3.3 oz) IBW/kg (Calculated) : 66.1  Vital Signs: Temp: 97.4 F (36.3 C) (02/09 2106) Temp Source: Oral (02/09 2106) BP: 136/65 (02/09 2106) Pulse Rate: 71 (02/09 2106)  Labs: Recent Labs    05/25/22 0712 05/25/22 1631 05/25/22 1827 05/25/22 1909 05/26/22 0347 05/26/22 1125 05/27/22 0325 05/27/22 1157 05/27/22 2239  HGB 8.6* 8.8*  --   --  8.5* 9.9* 11.1*  --   --   HCT 26.0* 27.4*  --   --  26.2* 29.0* 33.1*  --   --   PLT 348 411*  --   --  412*  --  395  --   --   APTT 42* 45*  --   --  52*  --   --   --  73*  HEPARINUNFRC 0.63 0.34  --   --  0.15*  --  0.10* 0.11*  --   CREATININE 1.47*  --   --   --  1.42*  --  1.37*  --   --   TROPONINIHS  --   --  25* 23*  --   --   --   --   --      Estimated Creatinine Clearance: 42.2 mL/min (A) (by C-G formula based on SCr of 1.37 mg/dL (H)).   Assessment: 77 y.o. male with h/o Afib, Xarelto on hold and s/p ex-lap colectomy/colostomy. Pharmacy consulted to dose heparin infusion. Heparin level remained subtherapeutic on ~30 units/kg/hr so antithrombin III level checked and it was 40 (low) indicating heparin resistance so decision made to change to argatroban. aPTT 40 on admission. CBC stable.  2/9 PM update:  aPTT therapeutic   Goal of Therapy:  aPTT 50-90 sec Monitor platelets by anticoagulation protocol: Yes   Plan:  Cont argatroban 1 mcg/kg/min F/u 4 hr aPTT until therapeutic x 2 then daily Daily CBC  Narda Bonds, PharmD, BCPS Clinical Pharmacist Phone: [phone]

## 2022-05-27 NOTE — Progress Notes (Signed)
Initial Nutrition Assessment  DOCUMENTATION CODES:   Severe malnutrition in context of chronic illness  INTERVENTION:  Continue Boost Breeze po TID, each supplement provides 250 kcal and 9 grams of protein.  Monitor magnesium, potassium, and phosphorus daily for at least 3 days, MD to replete as needed, as pt is at risk for refeeding syndrome.  Once diet advanced past clear liquids, recommend Ensure Enlive po TID, each supplement provides 350 kcal and 20 grams of protein.  As diet is advanced per surgery, consider advancing to dysphagia 3 (mechanical soft) diet as medically appropriate. Pt reports difficulty chewing with dentures.  NUTRITION DIAGNOSIS:   Severe Malnutrition related to chronic illness (COPD, diverticulitis with abscess) as evidenced by 8.9% weight loss over 3 months, moderate fat depletion, moderate muscle depletion, severe muscle depletion.  GOAL:   Patient will meet greater than or equal to 90% of their needs  MONITOR:   PO intake, Supplement acceptance, Diet advancement, Labs, Weight trends, I & O's, Skin  REASON FOR ASSESSMENT:   Malnutrition Screening Tool    ASSESSMENT:   77 year old male with PMHx of PAF, CAD s/p stent, ICM with EF 25% Nov 2023, PAD s/p aorto bifembypass in 1992 and fem-fem bypass 2011, HTN, DM type 2, COPD, diverticulitis with abscess s/p IR drain placement admitted to outside hospital on 05/19/22 and underwent repositioning of drain on 05/20/22, transferred to [facility] on 05/24/22 and is now s/p exploratory laparotomy, takedown of splenic flexure, partial colectomy, colostomy creation on 05/26/22.  2/9: diet advanced to clears and ordered for Boost Breeze by surgery  Met with pt and his daughter at bedside. Pt's hearing aids not working well at time of RD assessment, so daughter helped with providing history. Pt reports he was on liquid diet at outside hospital prior to transferring here and was made NPO on admission here. Prior to admission  he had been eating 2 meals per day. For breakfast he typically has eggs with biscuit and gravy and sausage. For dinner he has soup or another small meal. He denies food allergies or intolerances. He was having pain with eating PTA but has been tolerating clear liquids so far today. Denies nausea or emesis. Reports passing flatus. Pt with dentures and he endorses he does have some difficulty chewing. He is tolerating Boost Breeze supplement ordered by surgery.   Daughter reports pt has lost weight over time. UBW was 170 lbs (77.3 kg). Per review of chart pt was 77.3 kg on 02/10/22. He is currently 70.4 kg (155.2 lbs). He has lost 6.9 kg or 8.9% weight over approximately 3 months, which is significant for time frame.  Medications reviewed and include: Novolog 0-9 units Q4hrs, Semglee 5 units QHS, heparin, LR at 75 mL/hour, Zosyn  Labs reviewed: CBG 156-210, Sodium 132, CO2 18, Creatinine 1.37  UOP: 1465 mL (0.9 mL/kg/hr) RLP JP drain: 290 mL LLQ drain: 160 mL  I/O: +2993.9 mL since admission  Suspect there is an acute on chronic component to malnutrition with diverticulitis but wt had already started to decrease prior to December 2023.  NUTRITION - FOCUSED PHYSICAL EXAM:  Flowsheet Row Most Recent Value  Orbital Region Moderate depletion  Upper Arm Region Severe depletion  Thoracic and Lumbar Region Moderate depletion  Buccal Region Moderate depletion  Temple Region Moderate depletion  Clavicle Bone Region Moderate depletion  Clavicle and Acromion Bone Region Severe depletion  Scapular Bone Region Moderate depletion  Dorsal Hand Severe depletion  Patellar Region Severe depletion  Anterior  Thigh Region Moderate depletion  Posterior Calf Region Severe depletion  Edema (RD Assessment) None  Hair Reviewed  Eyes Reviewed  Mouth Reviewed  [dentures]  Skin Reviewed  Nails Reviewed       Diet Order:   Diet Order             Diet clear liquid Fluid consistency: Thin  Diet effective  now                  EDUCATION NEEDS:   Not appropriate for education at this time  Skin:  Skin Assessment: Skin Integrity Issues: Skin Integrity Issues:: Incisions Incisions: closed incision to abdomen  Last BM:  PTA; now with colostomy  Height:   Ht Readings from Last 1 Encounters:  05/26/22 5' 7"$  (1.702 m)   Weight:   Wt Readings from Last 1 Encounters:  05/26/22 70.4 kg   Ideal Body Weight:  67.3 kg  BMI:  Body mass index is 24.31 kg/m.  Estimated Nutritional Needs:   Kcal:  2000-2200  Protein:  100-110 grams  Fluid:  2-2.2 L/day  Loanne Drilling, MS, RD, LDN, CNSC Pager number available on Amion

## 2022-05-27 NOTE — Progress Notes (Signed)
  Progress Note   Patient: [name] NWG:[id number] DOB: Sep 05, 1945 DOA: 05/24/2022     3 DOS: the patient was seen and examined on 05/27/2022   Brief hospital course: 77 y.o. male with medical history significant of PAF, CAD s/p stent, ICM with EF of 25% as of Nov 2023, PAD s/p aorto bifembypass in 1992 and fem-fem bypass (maybe with fem-pop) by Dr. Donnetta Hutching in 2011, HTN, DM2, COPD quit smoking in 2022.   Pt with diverticulitis with abscess, initially on Apr 08, 2022.  Had IR drain placed.  Seemed to be doing better for month of Jan, drainage had basically stopped.  But then sudden worsening of abd pain and increased drain output saw him admitted to [facility] on 2/1.  IR repositioned drain on 2/2 but had ongoing feculent output despite this and ABx.  Ultimately surgery offered on 2/6 but family wanted him transferred for the surgery to larger hospital.  Pt transferred to [facility]  Assessment and Plan: * Diverticulitis of large intestine with abscess Failed medical treatment and IR drainage attempts x2 with ongoing feculant output around drain, 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 Surgery offered at [facility], however family had requested transfer to larger hospital, thus pt was transferred to [facility] -General Surgery following, now s/p surgery 2/8 -Would defer advancing diet per General Surgery when pt is ready to advance   Hypothyroidism Question of new diagnosis of hypothyroidism: -TSH 6.794 with free T4 of 1.31   AKI (acute kidney injury) ([place]) Creat at [facility] during admit: 1.6->1.0-> 1.4 Cr 1.37 this AM -had been continued on basal IVF -Recheck bmet in AM   Forgan, CHRONIC Holding ARB given AKI Cont BB Reportedly did require dose of lasix for concerns of volume overload on 2/3 at [facility]   DM2 (diabetes mellitus, type 2) (HCC) Glucose in the mid-200's this afternoon Will increase semglee to 10 units moderate SSI Q4H    Paroxysmal atrial fibrillation ([place]) Reportedly ran out of xarelto  at home last month -continuing to hold xarelto, last dose was 2/4 at [facility] -cont heparin gtt for now  CAD with known RCA occlusion -Cardiology following -Recs to eventually d/c on ASA and xarelto when ultimately OK with General Surgery post-op -cont BB     Subjective: Without complaints this afternoon  Physical Exam: Vitals:   05/27/22 0823 05/27/22 0839 05/27/22 1301 05/27/22 1742  BP:  (!) 106/58  119/69  Pulse: 66 71  73  Resp:  18  17  Temp:   98.8 F (37.1 C) 98.2 F (36.8 C)  TempSrc:   Oral   SpO2: 100%   94%  Weight:      Height:       General exam: Awake, laying in bed, in nad Respiratory system: Normal respiratory effort, no wheezing Cardiovascular system: regular rate, s1, s2 Gastrointestinal system: Soft, nondistended, positive BS Central nervous system: CN2-12 grossly intact, strength intact Extremities: Perfused, no clubbing Skin: Normal skin turgor, no notable skin lesions seen Psychiatry: Mood normal // no visual hallucinations   Data Reviewed:  Labs reviewed: Na 132, K 4.1, Cr 1.37, WBC 9.9   Family Communication: Pt in room, family not at bedside  Disposition: Status is: Inpatient Remains inpatient appropriate because: Severity of illness  Planned Discharge Destination: Home    Author: Marylu Lund, MD 05/27/2022 5:50 PM  For on call review [email or web address].

## 2022-05-27 NOTE — Progress Notes (Signed)
ANTICOAGULATION CONSULT NOTE  Pharmacy Consult for heparin Indication: atrial fibrillation   Allergies  Allergen Reactions   Lisinopril Swelling and Rash    Rash - face and tounge swell    Patient Measurements: Height: 5' 7"$  (170.2 cm) Weight: 70.4 kg (155 lb 3.3 oz) IBW/kg (Calculated) : 66.1  Vital Signs: Temp: 98.8 F (37.1 C) (02/09 1301) Temp Source: Oral (02/09 1301) BP: 106/58 (02/09 0839) Pulse Rate: 71 (02/09 0839)  Labs: Recent Labs    05/25/22 0712 05/25/22 1631 05/25/22 1827 05/25/22 1909 05/26/22 0347 05/26/22 1125 05/27/22 0325 05/27/22 1157  HGB 8.6* 8.8*  --   --  8.5* 9.9* 11.1*  --   HCT 26.0* 27.4*  --   --  26.2* 29.0* 33.1*  --   PLT 348 411*  --   --  412*  --  395  --   APTT 42* 45*  --   --  52*  --   --   --   HEPARINUNFRC 0.63 0.34  --   --  0.15*  --  0.10* 0.11*  CREATININE 1.47*  --   --   --  1.42*  --  1.37*  --   TROPONINIHS  --   --  25* 23*  --   --   --   --      Estimated Creatinine Clearance: 42.2 mL/min (A) (by C-G formula based on SCr of 1.37 mg/dL (H)).   Assessment: 77 y.o. male with h/o Afib, Xarelto on hold and s/p ex-lap colectomy/colostomy. Pharmacy consulted to dose heparin infusion.  Heparin level 0.11, subtherapeutic Current heparin infusion rate: 2000 units/hr (28 units/kg/hr)  Hgb 11.1, Plt 395 - stable No s/sx of bleeding per RN.  No issues or interruptions with heparin infusion.  Goal of Therapy:  Heparin level 0.3-0.7 units/ml Monitor platelets by anticoagulation protocol: Yes   Plan:  Increase heparin infusion rate to 2300 units/hr (32 units/kg/hr)  Check Antithrombin 3 level to assess for heparin resistance Check heparin level in 8 hours Monitor daily CBC, heparin levels, and for s/sx of bleeding If antithrombin 3 level is low, would recommend either transitioning to fondaparinux or bivalirudin/argatroban infusion until patient is able to transition back to Xarelto.   Luisa Hart, PharmD,  BCPS Clinical Pharmacist 05/27/2022 2:45 PM   Please refer to AMION for pharmacy phone number

## 2022-05-27 NOTE — Progress Notes (Signed)
Referring Physician(s): Moorhead,Andrew S.  Supervising Physician: Dr. Earleen Newport  Patient Status:  [facility] - In-pt  Chief Complaint:  Diverticular abscess Drain placed in IR at [facility] 05/12/22 Upsized 05/23/22  Subjective:  Pt taken to OR yesterday with Hartman's procedure IR drain left in place. Some discomfort    Allergies: Lisinopril  Medications:  Current Facility-Administered Medications:    0.9 %  sodium chloride infusion (Manually program via Guardrails IV Fluids), , Intravenous, Once, Meuth, Brooke A, PA-C   acetaminophen (TYLENOL) tablet 1,000 mg, 1,000 mg, Oral, Q6H, Saverio Danker, PA-C, 1,000 mg at 05/27/22 1144   carvedilol (COREG) tablet 12.5 mg, 12.5 mg, Oral, BID WC, Meuth, Brooke A, PA-C, 12.5 mg at 05/27/22 [id number]   Chlorhexidine Gluconate Cloth 2 % PADS 6 each, 6 each, Topical, Daily, Donne Hazel, MD, 6 each at 05/27/22 1152   feeding supplement (BOOST / RESOURCE BREEZE) liquid 1 Container, 1 Container, Oral, TID BM, Saverio Danker, PA-C   heparin ADULT infusion 100 units/mL (25000 units/251m), 2,000 Units/hr, Intravenous, Continuous, CDonne Hazel MD, Last Rate: 20 mL/hr at 05/27/22 1026, 2,000 Units/hr at 05/27/22 1026   insulin aspart (novoLOG) injection 0-9 Units, 0-9 Units, Subcutaneous, Q4H, Meuth, Brooke A, PA-C, 2 Units at 05/27/22 1145   insulin glargine-yfgn (SEMGLEE) injection 5 Units, 5 Units, Subcutaneous, QHS, Meuth, Brooke A, PA-C, 5 Units at 05/26/22 2355   lactated ringers infusion, , Intravenous, Continuous, Meuth, Brooke A, PA-C, Last Rate: 75 mL/hr at 05/27/22 1022, New Bag at 05/27/22 1022   melatonin tablet 3 mg, 3 mg, Oral, QHS PRN, Meuth, Brooke A, PA-C, 3 mg at 05/25/22 2223   methocarbamol (ROBAXIN) tablet 500 mg, 500 mg, Oral, Q6H, OSaverio Danker PA-C   morphine (PF) 2 MG/ML injection 2-4 mg, 2-4 mg, Intravenous, Q3H PRN, Meuth, Brooke A, PA-C, 4 mg at 05/27/22 [id number]  nitroGLYCERIN (NITROSTAT) SL tablet 0.4 mg, 0.4 mg,  Sublingual, Q5 min PRN, Meuth, Brooke A, PA-C, 0.4 mg at 05/25/22 2057   ondansetron (ZOFRAN) tablet 4 mg, 4 mg, Oral, Q6H PRN **OR** ondansetron (ZOFRAN) injection 4 mg, 4 mg, Intravenous, Q6H PRN, Meuth, Brooke A, PA-C   piperacillin-tazobactam (ZOSYN) IVPB 3.375 g, 3.375 g, Intravenous, Q8H, Meuth, Brooke A, PA-C, Last Rate: 12.5 mL/hr at 05/27/22 1119, 3.375 g at 05/27/22 1119   sodium chloride flush (NS) 0.9 % injection 10 mL, 10 mL, Intracatheter, Q8H, Meuth, Brooke A, PA-C, 10 mL at 05/27/22 1152    Vital Signs: BP (!) 106/58 (BP Location: Right Arm)   Pulse 71   Temp (!) 97.3 F (36.3 C) (Oral)   Resp 18   Ht 5' 7"$  (1.702 m)   Wt 155 lb 3.3 oz (70.4 kg)   SpO2 100%   BMI 24.31 kg/m   Physical Exam Skin:    General: Skin is warm.     Comments: Site of drain is clean and dry OP cloudy bloody     Imaging: DG CHEST PORT 1 VIEW  Result Date: 05/26/2022 CLINICAL DATA:  Central line placement. EXAM: PORTABLE CHEST 1 VIEW COMPARISON:  05/21/2022 FINDINGS: Interval placement of right IJ central venous catheter with tip over the SVC. Lungs are adequately inflated with minimal hazy prominence of the central perihilar vessels suggesting mild degree of vascular congestion. No airspace consolidation or effusion. No pneumothorax. Cardiomediastinal silhouette and remainder of the exam is unchanged. IMPRESSION: 1. Suggestion of minimal vascular congestion. 2. Right IJ central venous catheter with tip over the SVC. Electronically Signed  By: Marin Olp M.D.   On: 05/26/2022 14:11   CT Angio Chest Pulmonary Embolism (PE) W or WO Contrast  Result Date: 05/25/2022 CLINICAL DATA:  Pulmonary embolus suspected with high probability. EXAM: CT ANGIOGRAPHY CHEST WITH CONTRAST TECHNIQUE: Multidetector CT imaging of the chest was performed using the standard protocol during bolus administration of intravenous contrast. Multiplanar CT image reconstructions and MIPs were obtained to evaluate the  vascular anatomy. RADIATION DOSE REDUCTION: This exam was performed according to the departmental dose-optimization program which includes automated exposure control, adjustment of the mA and/or kV according to patient size and/or use of iterative reconstruction technique. CONTRAST:  49m OMNIPAQUE IOHEXOL 350 MG/ML SOLN COMPARISON:  11/30/2017 FINDINGS: Cardiovascular: Good opacification of the central and segmental pulmonary arteries. No focal filling defects. No evidence of significant pulmonary embolus. Normal heart size. No pericardial effusions. Normal caliber thoracic aorta. Calcification of the aorta and coronary arteries. Mediastinum/Nodes: Thyroid gland is unremarkable. Esophagus is decompressed. No significant lymphadenopathy. Lungs/Pleura: Small bilateral pleural effusions. Atelectasis in the lung bases is likely compressive. Emphysematous changes in the lungs. Bronchial wall thickening likely representing chronic bronchitis. Upper Abdomen: Cholelithiasis with small stones in the gallbladder. No gallbladder wall thickening. Musculoskeletal: Degenerative changes in the spine. Review of the MIP images confirms the above findings. IMPRESSION: 1. No evidence of significant pulmonary embolus. 2. Small bilateral pleural effusions with basilar atelectasis. 3. Cholelithiasis. Electronically Signed   By: WLucienne CapersM.D.   On: 05/25/2022 22:08   CT ABDOMEN PELVIS WO CONTRAST  Result Date: 05/25/2022 CLINICAL DATA:  Diverticulitis. EXAM: CT ABDOMEN AND PELVIS WITHOUT CONTRAST TECHNIQUE: Multidetector CT imaging of the abdomen and pelvis was performed following the standard protocol without IV contrast. RADIATION DOSE REDUCTION: This exam was performed according to the departmental dose-optimization program which includes automated exposure control, adjustment of the mA and/or kV according to patient size and/or use of iterative reconstruction technique. COMPARISON:  05/19/2022. FINDINGS: Lower chest:  Subsegmental volume loss in the dependent right lower lobe with dependent atelectasis in the left lower lobe. Heart is at the upper limits of normal in size to mildly enlarged. Decreased attenuation of the intravascular compartment is indicative of anemia. No pericardial or pleural effusion. Distal esophagus is grossly unremarkable. Hepatobiliary: Liver is unremarkable. Stones in the gallbladder. No biliary ductal dilatation. Pancreas: Scattered calcifications in the pancreas. Otherwise unremarkable. Spleen: Negative. Adrenals/Urinary Tract: Right adrenal gland is unremarkable. Slight nodular thickening of the left adrenal gland. No specific follow-up necessary. Low-attenuation lesions in the kidneys, better seen on 05/19/2022. No specific follow-up necessary. Tiny left renal stone. Ureters are decompressed. Bladder is grossly unremarkable. Stomach/Bowel: Stomach, small bowel and majority of the colon are unremarkable. There is a fistulous tract from the posterior margin of the proximal sigmoid colon to a collection of fluid and air in the left iliac fossa (3/50), measuring approximately 5.3 x 6.6 cm (3/53), similar to 05/19/2022 when remeasured in a similar fashion. Indwelling percutaneous drain with associated increased air. Abscess is continuous with and involves the left psoas muscle, similar. Appendectomy. Vascular/Lymphatic: Atherosclerotic calcification of the aorta. No pathologically enlarged lymph nodes. Aortobifemoral bypass graft. Reproductive: Prostate is visualized. Other: No free fluid. Mesenteries and peritoneum are otherwise unremarkable. Tiny midline supraumbilical ventral hernia contains fat. Musculoskeletal: Degenerative changes in the spine. Postoperative changes in the lumbar spine. No worrisome lytic or sclerotic lesions. IMPRESSION: 1. Grossly similar abscess in the left iliac fossa with a strongly suspected in fistulous connection to the proximal sigmoid colon, as on 05/19/2022.  Interval  exchange of the associated percutaneous drain and involvement of the left iliopsoas muscle. 2. Cholelithiasis. 3. Chronic calcific pancreatitis. 4. Tiny left renal stone. 5.  Aortic atherosclerosis (ICD10-I70.0). Electronically Signed   By: Lorin Picket M.D.   On: 05/25/2022 08:51    Labs:  CBC: Recent Labs    05/25/22 0712 05/25/22 1631 05/26/22 0347 05/26/22 1125 05/27/22 0325  WBC 12.4* 10.0 10.6*  --  9.9  HGB 8.6* 8.8* 8.5* 9.9* 11.1*  HCT 26.0* 27.4* 26.2* 29.0* 33.1*  PLT 348 411* 412*  --  395     COAGS: Recent Labs    05/24/22 2210 05/25/22 0712 05/25/22 1631 05/26/22 0347  APTT 40* 42* 45* 52*     BMP: Recent Labs    03/28/22 1038 05/25/22 0712 05/26/22 0347 05/26/22 1125 05/27/22 0325  NA 138 131* 133* 134* 132*  K 4.0 3.2* 4.5 4.3 4.1  CL 101 95* 97*  --  99  CO2 22 23 20*  --  18*  GLUCOSE 310* 122* 134*  --  203*  BUN 11 15 16  $ --  18  CALCIUM 9.1 7.9* 8.2*  --  8.0*  CREATININE 1.16 1.47* 1.42*  --  1.37*  GFRNONAA  --  49* 51*  --  53*     LIVER FUNCTION TESTS: Recent Labs    03/28/22 1038 05/25/22 0712 05/26/22 0347 05/27/22 0325  BILITOT 0.5 0.7 1.0 1.5*  AST 10 17 16 15  $ ALT 9 10 13 11  $ ALKPHOS 93 40 37* 29*  PROT 6.4 5.3* 5.2* 5.4*  ALBUMIN 3.2* 2.0* 1.9* 2.4*     Drain Location: LLQ Size:  Date of placement: 1/25-- upsize 05/23/22  Currently to: Drain collection device: gravity 24 hour output:  Output by Drain (mL) 05/25/22 0701 - 05/25/22 1900 05/25/22 1901 - 05/26/22 0700 05/26/22 0701 - 05/26/22 1900 05/26/22 1901 - 05/27/22 0700 05/27/22 0701 - 05/27/22 1230  Closed System Drain Left LLQ Other (Comment) 225 75  160   Closed System Drain 1 Right;Inferior RLQ Bulb (JP) 19 Fr.   210 80     Interval imaging/drain manipulation:  CT today  Current examination: Flushes/aspirates easily.  Insertion site unremarkable. Suture and stat lock in place. Dressed appropriately.   Plan: Continue TID flushes with 5 cc  NS. Record output Q shift. Dressing changes QD or PRN if soiled.  Call IR APP or on call IR MD if difficulty flushing or sudden change in drain output.    Discharge planning: Please contact IR APP or on call IR MD prior to patient d/c to ensure appropriate follow up plans are in place. Typically patient will follow up with IR clinic 10-14 days post d/c for repeat imaging/possible drain injection. IR scheduler will contact patient with date/time of appointment. Patient will need to flush drain QD with 5 cc NS, record output QD, dressing changes every 2-3 days or earlier if soiled.   IR will continue to follow - please call with questions or concerns.  Assessment and Plan:  Diverticular abscess drain OP dark bloody  Now s/p hartman's as well.   Electronically Signed: Ascencion Dike, PA-C 05/27/2022, 12:30 PM   I spent a total of 15 Minutes at the the patient's bedside AND on the patient's hospital floor or unit, greater than 50% of which was counseling/coordinating care for LLQ absc drain    Patient ID: [name], male   DOB: Jan 14, 1946, 77 y.o.   MRN: [id number]

## 2022-05-27 NOTE — Progress Notes (Signed)
ANTICOAGULATION CONSULT NOTE  Pharmacy Consult for heparin Indication: atrial fibrillation Brief A/P: Heparin level subtherapeutic Increase Heparin rate  Allergies  Allergen Reactions   Lisinopril Swelling and Rash    Rash - face and tounge swell    Patient Measurements: Height: 5' 7"$  (170.2 cm) Weight: 70.4 kg (155 lb 3.3 oz) IBW/kg (Calculated) : 66.1  Vital Signs: Temp: 97.3 F (36.3 C) (02/09 0420) Temp Source: Oral (02/09 0420) BP: 113/58 (02/09 0420) Pulse Rate: 70 (02/09 0420)  Labs: Recent Labs    05/25/22 0712 05/25/22 1631 05/25/22 1827 05/25/22 1909 05/26/22 0347 05/26/22 1125 05/27/22 0325  HGB 8.6* 8.8*  --   --  8.5* 9.9* 11.1*  HCT 26.0* 27.4*  --   --  26.2* 29.0* 33.1*  PLT 348 411*  --   --  412*  --  395  APTT 42* 45*  --   --  52*  --   --   HEPARINUNFRC 0.63 0.34  --   --  0.15*  --  0.10*  CREATININE 1.47*  --   --   --  1.42*  --   --   TROPONINIHS  --   --  25* 23*  --   --   --      Estimated Creatinine Clearance: 40.7 mL/min (A) (by C-G formula based on SCr of 1.42 mg/dL (H)).   Assessment: 77 y.o. male with h/o Afib, Xarelto on hold and s/p ex-lap colectomy/colostomy, for heparin  Goal of Therapy:  Heparin level 0.3-0.7 units/ml Monitor platelets by anticoagulation protocol: Yes   Plan:  Increase Heparin 2000 units/hr Check heparin level in 8 hours.  Phillis Knack, PharmD, BCPS  05/27/2022,4:47 AM

## 2022-05-27 NOTE — Progress Notes (Signed)
Physical Therapy Treatment Patient Details Name: [name] MRN: [id number] DOB: 1945-10-29 Today's Date: 05/27/2022   History of Present Illness 77 y.o. male admitted 2/6. s/p ex lap with Hartmann's procedure 2/8 for Complicated diverticulitis with coloatmospheric fistula. PMHx: PAF, CAD s/p stent, ICM with EF of 25%, PAD s/p aorto bifembypass, HTN, DM2, COPD    PT Comments    Pt admitted with above diagnosis. Required min assist initially with transfers but did progress to min guard with sit<>stand and gait with RW for support. Previously independent PTA. Patient lives alone with limited help available - states "daughter can come by from time to time."  Due to living alone, currently needing min assist, pt may need SNF however I am hopeful he will progress to a mod I level during admission, in which case he would be appropriate for HHPT. Will update recs as appropriate. Pt currently with functional limitations due to the deficits listed below (see PT Problem List). Pt will benefit from skilled PT to increase their independence and safety with mobility to allow discharge to the venue listed below.      Recommendations for follow up therapy are one component of a multi-disciplinary discharge planning process, led by the attending physician.  Recommendations may be updated based on patient status, additional functional criteria and insurance authorization.  Follow Up Recommendations  Skilled nursing-short term rehab (<3 hours/day) (May progress to HHPT*) Can patient physically be transported by private vehicle: Yes   Assistance Recommended at Discharge Intermittent Supervision/Assistance  Patient can return home with the following A little help with walking and/or transfers;A little help with bathing/dressing/bathroom;Assistance with cooking/housework;Assist for transportation;Help with stairs or ramp for entrance   Equipment Recommendations  None recommended by PT    Recommendations for  Other Services       Precautions / Restrictions Precautions Precautions: Fall Restrictions Weight Bearing Restrictions: No     Mobility  Bed Mobility Overal bed mobility: Needs Assistance Bed Mobility: Supine to Sit     Supine to sit: Supervision     General bed mobility comments: supervision for safety, extra time, assist with drains only.    Transfers Overall transfer level: Needs assistance Equipment used: Rolling walker (2 wheels) Transfers: Sit to/from Stand Sit to Stand: Min assist           General transfer comment: Min assist for boost to stand from bed, instability noted falling backwards. Practiced and improved to min guard.    Ambulation/Gait Ambulation/Gait assistance: Min guard Gait Distance (Feet): 100 Feet Assistive device: Rolling walker (2 wheels) Gait Pattern/deviations: Step-through pattern, Decreased stride length Gait velocity: decr Gait velocity interpretation: <1.31 ft/sec, indicative of household ambulator   General Gait Details: Slow, guarded and cautious. Close guard for safety. No buckling noted. Stable with RW for support. Educated on proximity to device and control with turns.   Stairs             Wheelchair Mobility    Modified Rankin (Stroke Patients Only)       Balance Overall balance assessment: Needs assistance Sitting-balance support: No upper extremity supported, Feet supported Sitting balance-Leahy Scale: Good     Standing balance support: Reliant on assistive device for balance Standing balance-Leahy Scale: Poor                              Cognition Arousal/Alertness: Awake/alert Behavior During Therapy: WFL for tasks assessed/performed Overall Cognitive Status: Within Functional Limits for  tasks assessed                                          Exercises      General Comments General comments (skin integrity, edema, etc.): SpO2 96% on RA throughout session.       Pertinent Vitals/Pain Pain Assessment Pain Assessment: 0-10 Pain Score: 5  Pain Location: LLQ Pain Descriptors / Indicators: Aching Pain Intervention(s): Limited activity within patient's tolerance, Repositioned, Monitored during session    [place] expects to be discharged to:: Private residence Living Arrangements: Alone Available Help at Discharge: Family;Available 24 hours/day (Daughter - [name]) Type of Home: House Home Access: Level entry       Home Layout: One level Home Equipment: Conservation officer, nature (2 wheels);Rollator (4 wheels);Cane - single point      Prior Function            PT Goals (current goals can now be found in the care plan section) Acute Rehab PT Goals Patient Stated Goal: Get well PT Goal Formulation: With patient Time For Goal Achievement: 06/10/22 Potential to Achieve Goals: Good    Frequency    Min 3X/week      PT Plan      Co-evaluation              AM-PAC PT "6 Clicks" Mobility   Outcome Measure  Help needed turning from your back to your side while in a flat bed without using bedrails?: None Help needed moving from lying on your back to sitting on the side of a flat bed without using bedrails?: None Help needed moving to and from a bed to a chair (including a wheelchair)?: A Little Help needed standing up from a chair using your arms (e.g., wheelchair or bedside chair)?: A Little Help needed to walk in hospital room?: A Little Help needed climbing 3-5 steps with a railing? : A Little 6 Click Score: 20    End of Session Equipment Utilized During Treatment: Gait belt Activity Tolerance: Patient tolerated treatment well Patient left: in chair;with chair alarm set;with call bell/phone within reach (Surgical PA in room with pt.) Nurse Communication: Mobility status PT Visit Diagnosis: Unsteadiness on feet (R26.81);Other abnormalities of gait and mobility (R26.89);Muscle weakness (generalized)  (M62.81);Difficulty in walking, not elsewhere classified (R26.2)     Time: [id number] PT Time Calculation (min) (ACUTE ONLY): 25 min  Charges:  $Gait Training: 8-22 mins                     Candie Mile, PT, DPT Physical Therapist Acute Rehabilitation Services [place] [facility]    [name] 05/27/2022, 11:38 AM

## 2022-05-27 NOTE — Care Management Important Message (Signed)
Important Message  Patient Details  Name: [name] MRN: [id number] Date of Birth: Mar 19, 1946   Medicare Important Message Given:  Yes     [name] Montine Circle 05/27/2022, 1:53 PM

## 2022-05-28 DIAGNOSIS — K5720 Diverticulitis of large intestine with perforation and abscess without bleeding: Secondary | ICD-10-CM | POA: Diagnosis not present

## 2022-05-28 DIAGNOSIS — I5022 Chronic systolic (congestive) heart failure: Secondary | ICD-10-CM | POA: Diagnosis not present

## 2022-05-28 LAB — COMPREHENSIVE METABOLIC PANEL
ALT: 10 U/L (ref 0–44)
AST: 16 U/L (ref 15–41)
Albumin: 2.2 g/dL — ABNORMAL LOW (ref 3.5–5.0)
Alkaline Phosphatase: 29 U/L — ABNORMAL LOW (ref 38–126)
Anion gap: 9 (ref 5–15)
BUN: 13 mg/dL (ref 8–23)
CO2: 23 mmol/L (ref 22–32)
Calcium: 8.2 mg/dL — ABNORMAL LOW (ref 8.9–10.3)
Chloride: 102 mmol/L (ref 98–111)
Creatinine, Ser: 1.12 mg/dL (ref 0.61–1.24)
GFR, Estimated: >60 mL/min (ref >60)
Glucose, Bld: 232 mg/dL — ABNORMAL HIGH (ref 70–99)
Potassium: 3.4 mmol/L — ABNORMAL LOW (ref 3.5–5.1)
Sodium: 134 mmol/L — ABNORMAL LOW (ref 135–145)
Total Bilirubin: 0.8 mg/dL (ref 0.3–1.2)
Total Protein: 5.3 g/dL — ABNORMAL LOW (ref 6.5–8.1)

## 2022-05-28 LAB — APTT
aPTT: 93 seconds — ABNORMAL HIGH (ref 24–36)
aPTT: 88 seconds — ABNORMAL HIGH (ref 24–36)
aPTT: 92 seconds — ABNORMAL HIGH (ref 24–36)

## 2022-05-28 LAB — CBC
HCT: 33.1 % — ABNORMAL LOW (ref 39.0–52.0)
Hemoglobin: 11.1 g/dL — ABNORMAL LOW (ref 13.0–17.0)
MCH: 28.7 pg (ref 26.0–34.0)
MCHC: 33.5 g/dL (ref 30.0–36.0)
MCV: 85.5 fL (ref 80.0–100.0)
Platelets: 412 10*3/uL — ABNORMAL HIGH (ref 150–400)
RBC: 3.87 MIL/uL — ABNORMAL LOW (ref 4.22–5.81)
RDW: 17 % — ABNORMAL HIGH (ref 11.5–15.5)
WBC: 9.4 10*3/uL (ref 4.0–10.5)
nRBC: 0 % (ref 0.0–0.2)

## 2022-05-28 LAB — GLUCOSE, CAPILLARY
Glucose-Capillary: 155 mg/dL — ABNORMAL HIGH (ref 70–99)
Glucose-Capillary: 166 mg/dL — ABNORMAL HIGH (ref 70–99)
Glucose-Capillary: 183 mg/dL — ABNORMAL HIGH (ref 70–99)
Glucose-Capillary: 199 mg/dL — ABNORMAL HIGH (ref 70–99)
Glucose-Capillary: 216 mg/dL — ABNORMAL HIGH (ref 70–99)
Glucose-Capillary: 221 mg/dL — ABNORMAL HIGH (ref 70–99)

## 2022-05-28 LAB — PHOSPHORUS: Phosphorus: 3.3 mg/dL (ref 2.5–4.6)

## 2022-05-28 LAB — MAGNESIUM: Magnesium: 2 mg/dL (ref 1.7–2.4)

## 2022-05-28 MED ORDER — OXYCODONE HCL 5 MG PO TABS
5.0000 mg | ORAL_TABLET | ORAL | Status: DC | PRN
  Administered 2022-05-28: 10 mg via ORAL
  Administered 2022-05-29 (×2): 5 mg via ORAL
  Administered 2022-05-30 – 2022-06-03 (×10): 10 mg via ORAL
  Filled 2022-05-28 (×7): qty 2
  Filled 2022-05-28: qty 1
  Filled 2022-05-28 (×4): qty 2
  Filled 2022-05-28: qty 1

## 2022-05-28 MED ORDER — KETOROLAC TROMETHAMINE 15 MG/ML IJ SOLN
15.0000 mg | Freq: Once | INTRAMUSCULAR | Status: AC
  Administered 2022-05-28: 15 mg via INTRAVENOUS
  Filled 2022-05-28: qty 1

## 2022-05-28 MED ORDER — POTASSIUM CHLORIDE CRYS ER 20 MEQ PO TBCR
60.0000 meq | EXTENDED_RELEASE_TABLET | Freq: Once | ORAL | Status: AC
  Administered 2022-05-28: 60 meq via ORAL
  Filled 2022-05-28: qty 3

## 2022-05-28 MED ORDER — INSULIN GLARGINE-YFGN 100 UNIT/ML ~~LOC~~ SOLN
8.0000 [IU] | Freq: Every day | SUBCUTANEOUS | Status: DC
  Administered 2022-05-28 – 2022-06-02 (×6): 8 [IU] via SUBCUTANEOUS
  Filled 2022-05-28 (×7): qty 0.08

## 2022-05-28 NOTE — Progress Notes (Signed)
ANTICOAGULATION CONSULT NOTE - Follow Up Consult  Pharmacy Consult for Argatroban Indication: atrial fibrillation  Allergies  Allergen Reactions   Lisinopril Swelling and Rash    Rash - face and tounge swell    Patient Measurements: Height: 5' 7"$  (170.2 cm) Weight: 70.4 kg (155 lb 3.3 oz) IBW/kg (Calculated) : 66.1 Argatroban Dosing Weight: 70.4 kg  Vital Signs: Temp: 98.5 F (36.9 C) (02/10 0938) Temp Source: Oral (02/10 0546) BP: 113/59 (02/10 0938) Pulse Rate: 64 (02/10 0938)  Labs: Recent Labs    05/25/22 1827 05/25/22 1909 05/26/22 0347 05/26/22 1125 05/27/22 0325 05/27/22 1157 05/27/22 2239 05/28/22 0410 05/28/22 1146  HGB  --   --  8.5* 9.9* 11.1*  --   --  11.1*  --   HCT  --   --  26.2* 29.0* 33.1*  --   --  33.1*  --   PLT  --   --  412*  --  395  --   --  412*  --   APTT  --   --  52*  --   --   --  73* 93* 88*  HEPARINUNFRC  --   --  0.15*  --  0.10* 0.11*  --   --   --   CREATININE  --   --  1.42*  --  1.37*  --   --  1.12  --   TROPONINIHS 25* 23*  --   --   --   --   --   --   --     Estimated Creatinine Clearance: 51.6 mL/min (by C-G formula based on SCr of 1.12 mg/dL).  Assessment: 77 y.o. male with h/o Afib, Xarelto on hold and s/p ex-lap colectomy/colostomy. Pharmacy consulted to dose heparin infusion. Heparin level remained subtherapeutic on ~30 units/kg/hr so antithrombin III level checked and it was 40 (low) indicating heparin resistance so decision made to change to argatroban on 2/9 pm. aPTT 40 on admission. CBC stable.   aPTT is now therapeutic (88 seconds) on Argatroban 0.8 mcg/kg/min. CBC stable.  Goal of Therapy:  aPTT 50-90 seconds Monitor platelets by anticoagulation protocol: Yes   Plan:  Continue Argatroban 0.8 mcg/kg/min. Confirmation aPTT later today. Daily aPTT and CBC. F/u for resuming Xarelto and ASA 81 mg when cleared by Surgery.  Arty Baumgartner, RPh 05/28/2022,12:28 PM

## 2022-05-28 NOTE — Progress Notes (Signed)
ANTICOAGULATION CONSULT NOTE - Follow Up Consult  Pharmacy Consult for Argatroban Indication: atrial fibrillation  Allergies  Allergen Reactions   Lisinopril Swelling and Rash    Rash - face and tounge swell    Patient Measurements: Height: 5' 7"$  (170.2 cm) Weight: 70.4 kg (155 lb 3.3 oz) IBW/kg (Calculated) : 66.1 Argatroban Dosing Weight: 70.4 kg  Vital Signs: Temp: 98.2 F (36.8 C) (02/10 1637) Temp Source: Oral (02/10 0546) BP: 115/63 (02/10 1637) Pulse Rate: 57 (02/10 1637)  Labs: Recent Labs    05/25/22 1827 05/25/22 1909 05/26/22 0347 05/26/22 0347 05/26/22 1125 05/27/22 0325 05/27/22 1157 05/27/22 2239 05/28/22 0410 05/28/22 1146 05/28/22 1614  HGB  --   --  8.5*   < > 9.9* 11.1*  --   --  11.1*  --   --   HCT  --   --  26.2*   < > 29.0* 33.1*  --   --  33.1*  --   --   PLT  --   --  412*  --   --  395  --   --  412*  --   --   APTT  --   --  52*  --   --   --   --    < > 93* 88* 92*  HEPARINUNFRC  --   --  0.15*  --   --  0.10* 0.11*  --   --   --   --   CREATININE  --   --  1.42*  --   --  1.37*  --   --  1.12  --   --   TROPONINIHS 25* 23*  --   --   --   --   --   --   --   --   --    < > = values in this interval not displayed.     Estimated Creatinine Clearance: 51.6 mL/min (by C-G formula based on SCr of 1.12 mg/dL).  Assessment: 77 y.o. male with h/o Afib, Xarelto on hold and s/p ex-lap colectomy/colostomy. Pharmacy consulted to dose heparin infusion. Heparin level remained subtherapeutic on ~30 units/kg/hr so antithrombin III level checked and it was 40 (low) indicating heparin resistance so decision made to change to argatroban on 2/9 pm. aPTT 40 on admission. CBC stable.   aPTT is slightly above goal (92 seconds) on Argatroban 0.8 mcg/kg/min. Will reduce infusion by 10%.  CBC stable.  Goal of Therapy:  aPTT 50-90 seconds Monitor platelets by anticoagulation protocol: Yes   Plan:  Decrease Argatroban 0.72 mcg/kg/min. Daily aPTT and  CBC. F/u for resuming Xarelto and ASA 81 mg when cleared by Surgery.  [employer], Pharm.D., BCPS Clinical Pharmacist  **Pharmacist phone directory can be found on [email or web address] listed under [place].  05/28/2022 4:59 PM

## 2022-05-28 NOTE — Progress Notes (Signed)
[facility] for heparin -> argatroban Indication: atrial fibrillation   Allergies  Allergen Reactions   Lisinopril Swelling and Rash    Rash - face and tounge swell    Patient Measurements: Height: 5' 7"$  (170.2 cm) Weight: 70.4 kg (155 lb 3.3 oz) IBW/kg (Calculated) : 66.1  Vital Signs: Temp: 97.4 F (36.3 C) (02/09 2106) Temp Source: Oral (02/09 2106) BP: 136/65 (02/09 2106) Pulse Rate: 71 (02/09 2106)  Labs: Recent Labs    05/25/22 0712 05/25/22 1631 05/25/22 1827 05/25/22 1909 05/26/22 0347 05/26/22 1125 05/27/22 0325 05/27/22 1157 05/27/22 2239 05/28/22 0410  HGB 8.6*   < >  --   --  8.5* 9.9* 11.1*  --   --  11.1*  HCT 26.0*   < >  --   --  26.2* 29.0* 33.1*  --   --  33.1*  PLT 348   < >  --   --  412*  --  395  --   --  412*  APTT 42*   < >  --   --  52*  --   --   --  73* 93*  HEPARINUNFRC 0.63   < >  --   --  0.15*  --  0.10* 0.11*  --   --   CREATININE 1.47*  --   --   --  1.42*  --  1.37*  --   --   --   TROPONINIHS  --   --  25* 23*  --   --   --   --   --   --    < > = values in this interval not displayed.     Estimated Creatinine Clearance: 42.2 mL/min (A) (by C-G formula based on SCr of 1.37 mg/dL (H)).   Assessment: 77 y.o. male with h/o Afib, Xarelto on hold and s/p ex-lap colectomy/colostomy. Pharmacy consulted to dose heparin infusion. Heparin level remained subtherapeutic on ~30 units/kg/hr so antithrombin III level checked and it was 40 (low) indicating heparin resistance so decision made to change to argatroban. aPTT 40 on admission. CBC stable.  2/10 AM update:  aPTT just above goal  Goal of Therapy:  aPTT 50-90 sec Monitor platelets by anticoagulation protocol: Yes   Plan:  Dec argatroban to 0.8 mcg/kg/min F/u 4 hr aPTT until therapeutic x 2 then daily Daily CBC  Narda Bonds, PharmD, BCPS Clinical Pharmacist Phone: [phone]

## 2022-05-28 NOTE — Progress Notes (Signed)
Progress Note  Patient Name: [name] Date of Encounter: 05/28/2022  Primary Cardiologist:   Jenne Campus, MD   Subjective   Mild abdominal pain.  No SOB. No chest pain.   Inpatient Medications    Scheduled Meds:  acetaminophen  1,000 mg Oral Q6H   carvedilol  12.5 mg Oral BID WC   Chlorhexidine Gluconate Cloth  6 each Topical Daily   feeding supplement  1 Container Oral TID BM   insulin aspart  0-9 Units Subcutaneous Q4H   insulin glargine-yfgn  8 Units Subcutaneous QHS   methocarbamol  500 mg Oral Q6H   sodium chloride flush  10 mL Intracatheter Q8H   Continuous Infusions:  argatroban 0.8 mcg/kg/min (05/28/22 0639)   lactated ringers 50 mL/hr at 05/28/22 1017   piperacillin-tazobactam (ZOSYN)  IV 3.375 g (05/28/22 0825)   PRN Meds: melatonin, morphine injection, nitroGLYCERIN, ondansetron **OR** ondansetron (ZOFRAN) IV, oxyCODONE   Vital Signs    Vitals:   05/27/22 1742 05/27/22 2106 05/28/22 0546 05/28/22 0938  BP: 119/69 136/65 132/69 (!) 113/59  Pulse: 73 71 68 64  Resp: 17 18 20 17  $ Temp: 98.2 F (36.8 C) (!) 97.4 F (36.3 C) 97.6 F (36.4 C) 98.5 F (36.9 C)  TempSrc:  Oral Oral   SpO2: 94%  94% 94%  Weight:      Height:        Intake/Output Summary (Last 24 hours) at 05/28/2022 1107 Last data filed at 05/28/2022 0849 Gross per 24 hour  Intake 3542.48 ml  Output 1330 ml  Net 2212.48 ml   Filed Weights   05/24/22 1952 05/24/22 2000 05/26/22 0829  Weight: 70.4 kg 70.4 kg 70.4 kg    Telemetry    NSR, PVCs - Personally Reviewed  ECG    NA - Personally Reviewed  Physical Exam   GEN: No acute distress.   Neck: No  JVD Cardiac: RRR, No murmurs, rubs, or gallops.  Respiratory: Clear to auscultation bilaterally. GI: Soft, nontender, non-distended  MS: No  edema; No deformity. Neuro:  Nonfocal  Psych: Normal affect   Labs    Chemistry Recent Labs  Lab 05/26/22 0347 05/26/22 1125 05/27/22 0325 05/28/22 0410  NA 133*  134* 132* 134*  K 4.5 4.3 4.1 3.4*  CL 97*  --  99 102  CO2 20*  --  18* 23  GLUCOSE 134*  --  203* 232*  BUN 16  --  18 13  CREATININE 1.42*  --  1.37* 1.12  CALCIUM 8.2*  --  8.0* 8.2*  PROT 5.2*  --  5.4* 5.3*  ALBUMIN 1.9*  --  2.4* 2.2*  AST 16  --  15 16  ALT 13  --  11 10  ALKPHOS 37*  --  29* 29*  BILITOT 1.0  --  1.5* 0.8  GFRNONAA 51*  --  53* >60  ANIONGAP 16*  --  15 9     Hematology Recent Labs  Lab 05/26/22 0347 05/26/22 1125 05/27/22 0325 05/28/22 0410  WBC 10.6*  --  9.9 9.4  RBC 3.07*  --  3.86* 3.87*  HGB 8.5* 9.9* 11.1* 11.1*  HCT 26.2* 29.0* 33.1* 33.1*  MCV 85.3  --  85.8 85.5  MCH 27.7  --  28.8 28.7  MCHC 32.4  --  33.5 33.5  RDW 17.8*  --  16.8* 17.0*  PLT 412*  --  395 412*    Cardiac EnzymesNo results for input(s): "TROPONINI" in the  last 168 hours. No results for input(s): "TROPIPOC" in the last 168 hours.   BNPNo results for input(s): "BNP", "PROBNP" in the last 168 hours.   DDimer No results for input(s): "DDIMER" in the last 168 hours.   Radiology    DG CHEST PORT 1 VIEW  Result Date: 05/26/2022 CLINICAL DATA:  Central line placement. EXAM: PORTABLE CHEST 1 VIEW COMPARISON:  05/21/2022 FINDINGS: Interval placement of right IJ central venous catheter with tip over the SVC. Lungs are adequately inflated with minimal hazy prominence of the central perihilar vessels suggesting mild degree of vascular congestion. No airspace consolidation or effusion. No pneumothorax. Cardiomediastinal silhouette and remainder of the exam is unchanged. IMPRESSION: 1. Suggestion of minimal vascular congestion. 2. Right IJ central venous catheter with tip over the SVC. Electronically Signed   By: Marin Olp M.D.   On: 05/26/2022 14:11    Cardiac Studies   Echo: 11/20203  1. Left ventricular ejection fraction by PLAX is 25 %. The left ventricle has  severely decreased function. The left ventricle demonstrates global  hypokinesis. The left ventricular  internal   cavity size was mildly dilated. Left ventricular diastolic parameters are  consistent with Grade II diastolic dysfunction (pseudonormalization).  Elevated left atrial pressure. The average left ventricular global  longitudinal strain is -4.9 %. The global  longitudinal strain is abnormal.   2. Right ventricular systolic function is mildly reduced. The right  ventricular size is normal. There is normal pulmonary artery systolic  pressure.   3. Left atrial size was mildly dilated.   4. The mitral valve is degenerative. Mild mitral valve regurgitation. No  evidence of mitral stenosis.   5. The aortic valve is normal in structure. Aortic valve regurgitation is  not visualized. No aortic stenosis is present.   6. Aortic Normal DTA.   7. The inferior vena cava is normal in size with greater than 50%  respiratory variability, suggesting right atrial pressure of 3 mmHg.   Patient Profile     77 y.o. male with PMH of CAD s/p stents and CTO of RCA, paroxysmal atrial fibrillation on Xarelto, COPD, chronic systolic heart failure, ischemic cardiomyopathy, hyperlipidemia, hypertension, diabetes mellitus, ongoing tobacco abuse and PAD, who is admitted for diverticulitis of large intestine with abscess, cardiology was consulted for pre-op risk evaluation.   Assessment & Plan    Paroxysmal atrial fibrillation:  Resume Xarelto and ASA when OK with surgery.  Maintaining NSR.    Chronic anticoagulation:  As above. Restart when OK with surgery.     Chronic systolic heart failure:  EF in Nov was 25%.  Please note that there is an error on the report and that the EF is severely reduced.   I will likely wait another day to restart Entresto.       CAD:  No evidence of active ischemia.        For questions or updates, please contact [place] Please consult [email or web address] for contact info under Cardiology/STEMI.   Signed, Minus Breeding, MD  05/28/2022, 11:07 AM

## 2022-05-28 NOTE — Progress Notes (Signed)
Trauma/Critical Care Follow Up Note  Subjective:    Overnight Issues:   Objective:  Vital signs for last 24 hours: Temp:  [97.4 F (36.3 C)-98.8 F (37.1 C)] 98.5 F (36.9 C) (02/10 0938) Pulse Rate:  [64-73] 64 (02/10 0938) Resp:  [17-20] 17 (02/10 0938) BP: (113-136)/(59-69) 113/59 (02/10 0938) SpO2:  [94 %] 94 % (02/10 0938)  Hemodynamic parameters for last 24 hours:    Intake/Output from previous day: 02/09 0701 - 02/10 0700 In: 3142.5 [P.O.:951; I.V.:2058.2; IV Piggyback:133.2] Out: 1490 [Urine:1150; Drains:340]  Intake/Output this shift: Total I/O In: 400 [P.O.:400] Out: 40 [Drains:40]  Vent settings for last 24 hours:    Physical Exam:  Gen: comfortable, no distress Neuro: non-focal exam HEENT: PERRL Neck: supple CV: RRR Pulm: unlabored breathing Abd: soft, appropriately TTP, midline looks good, ostomy productive of gas GU: clear yellow urine Extr: wwp, no edema   Results for orders placed or performed during the hospital encounter of 05/24/22 (from the past 24 hour(s))  Glucose, capillary     Status: Abnormal   Collection Time: 05/27/22 11:10 AM  Result Value Ref Range   Glucose-Capillary 156 (H) 70 - 99 mg/dL  Heparin level (unfractionated)     Status: Abnormal   Collection Time: 05/27/22 11:57 AM  Result Value Ref Range   Heparin Unfractionated 0.11 (L) 0.30 - 0.70 IU/mL  Antithrombin III     Status: Abnormal   Collection Time: 05/27/22  3:20 PM  Result Value Ref Range   AntiThromb III Func 40 (L) 75 - 120 %  Glucose, capillary     Status: Abnormal   Collection Time: 05/27/22  4:42 PM  Result Value Ref Range   Glucose-Capillary 263 (H) 70 - 99 mg/dL  Glucose, capillary     Status: Abnormal   Collection Time: 05/27/22  8:59 PM  Result Value Ref Range   Glucose-Capillary 322 (H) 70 - 99 mg/dL  APTT     Status: Abnormal   Collection Time: 05/27/22 10:39 PM  Result Value Ref Range   aPTT 73 (H) 24 - 36 seconds  Glucose, capillary      Status: Abnormal   Collection Time: 05/27/22 11:52 PM  Result Value Ref Range   Glucose-Capillary 183 (H) 70 - 99 mg/dL  CBC     Status: Abnormal   Collection Time: 05/28/22  4:10 AM  Result Value Ref Range   WBC 9.4 4.0 - 10.5 K/uL   RBC 3.87 (L) 4.22 - 5.81 MIL/uL   Hemoglobin 11.1 (L) 13.0 - 17.0 g/dL   HCT 33.1 (L) 39.0 - 52.0 %   MCV 85.5 80.0 - 100.0 fL   MCH 28.7 26.0 - 34.0 pg   MCHC 33.5 30.0 - 36.0 g/dL   RDW 17.0 (H) 11.5 - 15.5 %   Platelets 412 (H) 150 - 400 K/uL   nRBC 0.0 0.0 - 0.2 %  Comprehensive metabolic panel     Status: Abnormal   Collection Time: 05/28/22  4:10 AM  Result Value Ref Range   Sodium 134 (L) 135 - 145 mmol/L   Potassium 3.4 (L) 3.5 - 5.1 mmol/L   Chloride 102 98 - 111 mmol/L   CO2 23 22 - 32 mmol/L   Glucose, Bld 232 (H) 70 - 99 mg/dL   BUN 13 8 - 23 mg/dL   Creatinine, Ser 1.12 0.61 - 1.24 mg/dL   Calcium 8.2 (L) 8.9 - 10.3 mg/dL   Total Protein 5.3 (L) 6.5 - 8.1 g/dL  Albumin 2.2 (L) 3.5 - 5.0 g/dL   AST 16 15 - 41 U/L   ALT 10 0 - 44 U/L   Alkaline Phosphatase 29 (L) 38 - 126 U/L   Total Bilirubin 0.8 0.3 - 1.2 mg/dL   GFR, Estimated >60 >60 mL/min   Anion gap 9 5 - 15  Magnesium     Status: None   Collection Time: 05/28/22  4:10 AM  Result Value Ref Range   Magnesium 2.0 1.7 - 2.4 mg/dL  Phosphorus     Status: None   Collection Time: 05/28/22  4:10 AM  Result Value Ref Range   Phosphorus 3.3 2.5 - 4.6 mg/dL  APTT     Status: Abnormal   Collection Time: 05/28/22  4:10 AM  Result Value Ref Range   aPTT 93 (H) 24 - 36 seconds  Glucose, capillary     Status: Abnormal   Collection Time: 05/28/22  5:43 AM  Result Value Ref Range   Glucose-Capillary 199 (H) 70 - 99 mg/dL  Glucose, capillary     Status: Abnormal   Collection Time: 05/28/22  7:24 AM  Result Value Ref Range   Glucose-Capillary 166 (H) 70 - 99 mg/dL    Assessment & Plan:  Present on Admission:  Diverticulitis of large intestine with abscess  AKI (acute kidney  injury) ([place])  SYSTOLIC HEART FAILURE, CHRONIC  PAF (paroxysmal atrial fibrillation) ([facility])  Hypothyroidism  Chronic systolic (congestive) heart failure ([place])    LOS: 4 days   Additional comments:I reviewed the patient's new clinical lab test results.   and I reviewed the patients new imaging test results.    POD 2, s/p ex lap with Hartmann's procedure for complicated diverticulitis with coloatmospheric fistula by Dr. Bobbye Morton 2/8- -IR drain still in place -surgical drain in place -WOC following for new colostomy -will allow full liquids today -mobilize with PT/OT -cont IV abx therapy -WBC nl -prealbumin - <5.  Add resource breeze   FEN - CLD, resource breeze, IVFs per primary DVT - SCDs, argatroban gtt ID - [email or web address]   Potential readiness for d/c 2/11  Jesusita Oka, MD Trauma & General Surgery Please use [email or web address] to contact on call provider  05/28/2022  *Care during the described time interval was provided by me. I have reviewed this patient's available data, including medical history, events of note, physical examination and test results as part of my evaluation.

## 2022-05-28 NOTE — Progress Notes (Signed)
  Progress Note   Patient: [name] OVZ:[id number] DOB: [birth year] DOA: 05/24/2022     4 DOS: the patient was seen and examined on 05/28/2022   Brief hospital course: 77 y.o. male with medical history significant of PAF, CAD s/p stent, ICM with EF of 25% as of Nov 2023, PAD s/p aorto bifembypass in 1992 and fem-fem bypass (maybe with fem-pop) by Dr. Donnetta Hutching in 2011, HTN, DM2, COPD quit smoking in 2022.   Pt with diverticulitis with abscess, initially on Apr 08, 2022.  Had IR drain placed.  Seemed to be doing better for month of Jan, drainage had basically stopped.  But then sudden worsening of abd pain and increased drain output saw him admitted to [facility] on 2/1.  IR repositioned drain on 2/2 but had ongoing feculent output despite this and ABx.  Ultimately surgery offered on 2/6 but family wanted him transferred for the surgery to larger hospital.  Pt transferred to [facility]  Assessment and Plan: * Diverticulitis of large intestine with abscess Failed medical treatment and IR drainage attempts x2 with ongoing feculant output around drain, 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 Surgery offered at [facility], however family had requested transfer to larger hospital, thus pt was transferred to [facility] -General Surgery following, now s/p surgery 2/8 -Advancing diet per Surgery. Discussed with General Surgerysd   Hypothyroidism Question of new diagnosis of hypothyroidism: -TSH 6.794 with free T4 of 1.31   AKI (acute kidney injury) ([place]) Creat at [facility] during admit: 1.6->1.0-> 1.4 Cr 1.12 -had been continued on basal IVF -Recheck bmet in AM   [place], CHRONIC Holding ARB given AKI Cont BB Reportedly did require dose of lasix for concerns of volume overload on 2/3 at [facility]   DM2 (diabetes mellitus, type 2) (HCC) Glucose in the mid-200's this afternoon Currently on semglee to 10 units moderate SSI Q4H    Paroxysmal atrial fibrillation (HCC) Reportedly ran out of xarelto at home last  month -continuing to hold xarelto, last dose was 2/4 at [facility] -cont on argatroban bridge for now  CAD with known RCA occlusion -Cardiology following -Recs to eventually d/c on ASA and xarelto when ultimately OK with General Surgery post-op -cont BB -Per cardiology, plan to wait another day to restart entresto     Subjective: No complaints this AM  Physical Exam: Vitals:   05/27/22 1742 05/27/22 2106 05/28/22 0546 05/28/22 0938  BP: 119/69 136/65 132/69 (!) 113/59  Pulse: 73 71 68 64  Resp: '17 18 20 17  '$ Temp: 98.2 F (36.8 C) (!) 97.4 F (36.3 C) 97.6 F (36.4 C) 98.5 F (36.9 C)  TempSrc:  Oral Oral   SpO2: 94%  94% 94%  Weight:      Height:       General exam: Conversant, in no acute distress Respiratory system: normal chest rise, clear, no audible wheezing Cardiovascular system: regular rhythm, s1-s2 Gastrointestinal system: Nondistended, nontender, pos BS Central nervous system: No seizures, no tremors Extremities: No cyanosis, no joint deformities Skin: No rashes, no pallor Psychiatry: Affect normal // no auditory hallucinations   Data Reviewed:  Labs reviewed: Na 134, K 3.4, Cr 1.12, WBC 9.4   Family Communication: Pt in room, family not at bedside  Disposition: Status is: Inpatient Remains inpatient appropriate because: Severity of illness  Planned Discharge Destination: Skilled nursing facility    Author: Marylu Lund, MD 05/28/2022 3:17 PM  For on call review [email or web address].

## 2022-05-29 DIAGNOSIS — I5021 Acute systolic (congestive) heart failure: Secondary | ICD-10-CM

## 2022-05-29 DIAGNOSIS — E43 Unspecified severe protein-calorie malnutrition: Secondary | ICD-10-CM | POA: Insufficient documentation

## 2022-05-29 DIAGNOSIS — K5720 Diverticulitis of large intestine with perforation and abscess without bleeding: Secondary | ICD-10-CM | POA: Diagnosis not present

## 2022-05-29 HISTORY — DX: Unspecified severe protein-calorie malnutrition: E43

## 2022-05-29 LAB — COMPREHENSIVE METABOLIC PANEL
ALT: 11 U/L (ref 0–44)
AST: 14 U/L — ABNORMAL LOW (ref 15–41)
Albumin: 2 g/dL — ABNORMAL LOW (ref 3.5–5.0)
Alkaline Phosphatase: 31 U/L — ABNORMAL LOW (ref 38–126)
Anion gap: 7 (ref 5–15)
BUN: 11 mg/dL (ref 8–23)
CO2: 23 mmol/L (ref 22–32)
Calcium: 8.1 mg/dL — ABNORMAL LOW (ref 8.9–10.3)
Chloride: 105 mmol/L (ref 98–111)
Creatinine, Ser: 1.1 mg/dL (ref 0.61–1.24)
GFR, Estimated: >60 mL/min (ref >60)
Glucose, Bld: 198 mg/dL — ABNORMAL HIGH (ref 70–99)
Potassium: 3.8 mmol/L (ref 3.5–5.1)
Sodium: 135 mmol/L (ref 135–145)
Total Bilirubin: 0.7 mg/dL (ref 0.3–1.2)
Total Protein: 4.9 g/dL — ABNORMAL LOW (ref 6.5–8.1)

## 2022-05-29 LAB — GLUCOSE, CAPILLARY
Glucose-Capillary: 106 mg/dL — ABNORMAL HIGH (ref 70–99)
Glucose-Capillary: 134 mg/dL — ABNORMAL HIGH (ref 70–99)
Glucose-Capillary: 140 mg/dL — ABNORMAL HIGH (ref 70–99)
Glucose-Capillary: 166 mg/dL — ABNORMAL HIGH (ref 70–99)
Glucose-Capillary: 180 mg/dL — ABNORMAL HIGH (ref 70–99)
Glucose-Capillary: 181 mg/dL — ABNORMAL HIGH (ref 70–99)

## 2022-05-29 LAB — CBC
HCT: 36.4 % — ABNORMAL LOW (ref 39.0–52.0)
Hemoglobin: 11.6 g/dL — ABNORMAL LOW (ref 13.0–17.0)
MCH: 28.2 pg (ref 26.0–34.0)
MCHC: 31.9 g/dL (ref 30.0–36.0)
MCV: 88.3 fL (ref 80.0–100.0)
Platelets: 484 10*3/uL — ABNORMAL HIGH (ref 150–400)
RBC: 4.12 MIL/uL — ABNORMAL LOW (ref 4.22–5.81)
RDW: 17.5 % — ABNORMAL HIGH (ref 11.5–15.5)
WBC: 11.5 10*3/uL — ABNORMAL HIGH (ref 4.0–10.5)
nRBC: 0 % (ref 0.0–0.2)

## 2022-05-29 LAB — PHOSPHORUS: Phosphorus: 2.8 mg/dL (ref 2.5–4.6)

## 2022-05-29 LAB — APTT: aPTT: 71 seconds — ABNORMAL HIGH (ref 24–36)

## 2022-05-29 LAB — MAGNESIUM: Magnesium: 1.7 mg/dL (ref 1.7–2.4)

## 2022-05-29 MED ORDER — CALCIUM CARBONATE ANTACID 500 MG PO CHEW
1.0000 | CHEWABLE_TABLET | Freq: Three times a day (TID) | ORAL | Status: DC | PRN
  Administered 2022-05-29 – 2022-06-01 (×5): 200 mg via ORAL
  Filled 2022-05-29 (×6): qty 1

## 2022-05-29 MED ORDER — SPIRONOLACTONE 25 MG PO TABS
25.0000 mg | ORAL_TABLET | Freq: Every day | ORAL | Status: DC
  Administered 2022-05-29 – 2022-06-03 (×6): 25 mg via ORAL
  Filled 2022-05-29 (×6): qty 1

## 2022-05-29 MED ORDER — POTASSIUM CHLORIDE CRYS ER 20 MEQ PO TBCR
40.0000 meq | EXTENDED_RELEASE_TABLET | Freq: Once | ORAL | Status: AC
  Administered 2022-05-29: 40 meq via ORAL
  Filled 2022-05-29: qty 2

## 2022-05-29 NOTE — Progress Notes (Signed)
Assessment & Plan: POD#3 - s/p ex lap with Hartmann's procedure for complicated diverticulitis with coloatmospheric fistula by Dr. Bobbye Morton 2/8 -IR drain to remain in place -surgical drain to remain in place -WOC following for new colostomy, fistula -continue full liquid diet -mobilize with PT/OT -cont IV abx therapy -prealbumin <5 - continue resource breeze   FEN - FLD, resource breeze, IVFs per primary DVT - SCDs, argatroban gtt ID - [email or web address]    Patient states planning to go to his daughter's home in [place] on discharge.  Will need HHN to assist with wound care, drains, dressing changes.  Will enter consult request.  Anticipate ready for discharge from surgical standpoint 1-2 days.        Armandina Gemma, MD [facility] [place] practice Office: [phone]        Chief Complaint: Complex diverticulitis with fistula  Subjective: Patient in bed, no complaints.  Tolerating full liquid diet.  Objective: Vital signs in last 24 hours: Temp:  [97.4 F (36.3 C)-98.5 F (36.9 C)] 98 F (36.7 C) (02/11 0427) Pulse Rate:  [57-87] 87 (02/11 0825) Resp:  [16-18] 18 (02/11 0825) BP: (113-143)/(59-85) 142/85 (02/11 0825) SpO2:  [94 %-97 %] 94 % (02/11 0825)    Intake/Output from previous day: 02/10 0701 - 02/11 0700 In: 2964.6 [P.O.:1300; I.V.:1435.6; IV Piggyback:229.1] Out: 1310 [Urine:1150; Drains:160] Intake/Output this shift: Total I/O In: -  Out: 230 [Urine:150; Drains:80]  Physical Exam: HEENT - sclerae clear, mucous membranes moist Neck - soft Abdomen - dressing dry and intact; right JP drain with thin serosanguinous; left gravity drain with thin brown output; Eakin's pouch LLQ with thin feculent output  Lab Results:  Recent Labs    05/27/22 0325 05/28/22 0410  WBC 9.9 9.4  HGB 11.1* 11.1*  HCT 33.1* 33.1*  PLT 395 412*   BMET Recent Labs    05/27/22 0325 05/28/22 0410  NA 132* 134*  K 4.1 3.4*  CL 99 102  CO2 18* 23  GLUCOSE 203*  232*  BUN 18 13  CREATININE 1.37* 1.12  CALCIUM 8.0* 8.2*   PT/INR No results for input(s): "LABPROT", "INR" in the last 72 hours. Comprehensive Metabolic Panel:    Component Value Date/Time   NA 134 (L) 05/28/2022 0410   NA 132 (L) 05/27/2022 0325   NA 138 03/28/2022 1038   NA 141 02/09/2018 0721   K 3.4 (L) 05/28/2022 0410   K 4.1 05/27/2022 0325   CL 102 05/28/2022 0410   CL 99 05/27/2022 0325   CO2 23 05/28/2022 0410   CO2 18 (L) 05/27/2022 0325   BUN 13 05/28/2022 0410   BUN 18 05/27/2022 0325   BUN 11 03/28/2022 1038   BUN 17 02/09/2018 0721   CREATININE 1.12 05/28/2022 0410   CREATININE 1.37 (H) 05/27/2022 0325   CREATININE 1.20 09/10/2013 0513   GLUCOSE 232 (H) 05/28/2022 0410   GLUCOSE 203 (H) 05/27/2022 0325   CALCIUM 8.2 (L) 05/28/2022 0410   CALCIUM 8.0 (L) 05/27/2022 0325   AST 16 05/28/2022 0410   AST 15 05/27/2022 0325   ALT 10 05/28/2022 0410   ALT 11 05/27/2022 0325   ALKPHOS 29 (L) 05/28/2022 0410   ALKPHOS 29 (L) 05/27/2022 0325   BILITOT 0.8 05/28/2022 0410   BILITOT 1.5 (H) 05/27/2022 0325   BILITOT 0.5 03/28/2022 1038   BILITOT 0.6 02/09/2018 0721   PROT 5.3 (L) 05/28/2022 0410   PROT 5.4 (L) 05/27/2022 0325   PROT 6.4 03/28/2022 1038  PROT 6.9 02/09/2018 0721   ALBUMIN 2.2 (L) 05/28/2022 0410   ALBUMIN 2.4 (L) 05/27/2022 0325   ALBUMIN 3.2 (L) 03/28/2022 1038   ALBUMIN 4.4 02/09/2018 0721    Studies/Results: No results found.    Armandina Gemma 05/29/2022  Patient ID: [name], male   DOB: [birth year], 77 y.o.   MRN: [id number]

## 2022-05-29 NOTE — Progress Notes (Signed)
ANTICOAGULATION CONSULT NOTE - Follow Up Consult  Pharmacy Consult for Argatroban Indication: atrial fibrillation  Allergies  Allergen Reactions   Lisinopril Swelling and Rash    Rash - face and tounge swell    Patient Measurements: Height: 5' 7"$  (170.2 cm) Weight: 70.4 kg (155 lb 3.3 oz) IBW/kg (Calculated) : 66.1 Argatroban Dosing Weight: 70.4 kg  Vital Signs: Temp: 98.4 F (36.9 C) (02/11 0915) BP: 147/77 (02/11 0915) Pulse Rate: 82 (02/11 0915)  Labs: Recent Labs    05/27/22 0325 05/27/22 1157 05/27/22 2239 05/28/22 0410 05/28/22 1146 05/28/22 1614 05/29/22 1135  HGB 11.1*  --   --  11.1*  --   --  11.6*  HCT 33.1*  --   --  33.1*  --   --  36.4*  PLT 395  --   --  412*  --   --  484*  APTT  --   --    < > 93* 88* 92* 71*  HEPARINUNFRC 0.10* 0.11*  --   --   --   --   --   CREATININE 1.37*  --   --  1.12  --   --  1.10   < > = values in this interval not displayed.     Estimated Creatinine Clearance: 52.6 mL/min (by C-G formula based on SCr of 1.1 mg/dL).  Assessment: 77 y.o. male with h/o Afib, Xarelto on hold and s/p ex-lap colectomy/colostomy. Pharmacy consulted to dose heparin infusion. Heparin level remained subtherapeutic on ~30 units/kg/hr so antithrombin III level checked and it was 40 (low) indicating heparin resistance so decision made to change to argatroban on 2/9 pm. aPTT 40 on admission. CBC stable.  aPTT is therapeutic (71 seconds) on Argatroban 0.72 mcg/kg/min. CBC stable.  Goal of Therapy:  aPTT 50-90 seconds Monitor platelets by anticoagulation protocol: Yes   Plan:  Continue Argatroban 0.72 mcg/kg/min. Daily aPTT and CBC. F/u for resuming Xarelto and ASA 81 mg when cleared by Surgery.  Arty Baumgartner, [place] 05/29/2022,12:40 PM

## 2022-05-29 NOTE — Progress Notes (Addendum)
Progress Note   Patient: [name] [id number] DOB: 09-25-1945 DOA: 05/24/2022     5 DOS: the patient was seen and examined on 05/29/2022   Brief hospital course: 77 y.o. male with medical history significant of PAF, CAD s/p stent, ICM with EF of 25% as of Nov 2023, PAD s/p aorto bifembypass in 1992 and fem-fem bypass (maybe with fem-pop) by Dr. Donnetta Hutching in 2011, HTN, DM2, COPD quit smoking in 2022.   Pt with diverticulitis with abscess, initially on Apr 08, 2022.  Had IR drain placed.  Seemed to be doing better for month of Jan, drainage had basically stopped.  But then sudden worsening of abd pain and increased drain output saw him admitted to [facility] on 2/1.  IR repositioned drain on 2/2 but had ongoing feculent output despite this and ABx.  Ultimately surgery offered on 2/6 but family wanted him transferred for the surgery to larger hospital.  Pt transferred to [facility]  Assessment and Plan: * Diverticulitis of large intestine with abscess Failed medical treatment and IR drainage attempts x2 with ongoing feculant output around drain, 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 Surgery offered at [facility], however family had requested transfer to larger hospital, thus pt was transferred to [facility] -General Surgery following, now s/p surgery 2/8 -Advancing diet per Surgery. Per Surgery, possible d/c in 1-2 days   Hypothyroidism Question of new diagnosis of hypothyroidism: -TSH 6.794 with free T4 of 1.31   AKI (acute kidney injury) ([place]) Creat at [facility] during admit: 1.6->1.0-> 1.4 Cr 1.12 -had been continued on basal IVF -Recheck bmet in AM   [place], CHRONIC Holding ARB given AKI Cont BB Reportedly did require dose of lasix for concerns of volume overload on 2/3 at [facility]   DM2 (diabetes mellitus, type 2) (HCC) Glucose in the mid-200's this afternoon Currently on semglee to 10 units moderate SSI Q4H    Paroxysmal atrial fibrillation (HCC) Reportedly ran out of xarelto at home last  month -continuing to hold xarelto, last dose was 2/4 at [facility] -cont on argatroban bridge for now, resume xarelto when OK with Surgery  CAD with known RCA occlusion -Cardiology following -Recs to eventually d/c on ASA and xarelto when ultimately OK with General Surgery post-op -cont BB -Per cardiology, given hx of tongue and lip swelling to ACEI, will avoid ACEI/ARB. Plan for spironolactone and Farxiga before discharge with BiDil  Subjective: Eager to go home soon  Physical Exam: Vitals:   05/29/22 0427 05/29/22 0825 05/29/22 0915 05/29/22 1531  BP: (!) 143/80 (!) 142/85 (!) 147/77 (!) 125/59  Pulse: 77 87 82   Resp: 18 18 18 20  $ Temp: 98 F (36.7 C)  98.4 F (36.9 C) 97.6 F (36.4 C)  TempSrc:    Oral  SpO2: 94% 94% 96% 95%  Weight:      Height:       General exam: Awake, laying in bed, in nad Respiratory system: Normal respiratory effort, no wheezing Cardiovascular system: regular rate, s1, s2 Gastrointestinal system: Soft, nondistended, positive BS Central nervous system: CN2-12 grossly intact, strength intact Extremities: Perfused, no clubbing Skin: Normal skin turgor, no notable skin lesions seen Psychiatry: Mood normal // no visual hallucinations   Data Reviewed:  Labs reviewed: Na 135, K 3.8, Cr 1.10, WBC 11.5, Hgb 11.6   Family Communication: Pt in room, family not at bedside  Disposition: Status is: Inpatient Remains inpatient appropriate because: Severity of illness  Planned Discharge Destination: Home    Author: Annie Main  Wyline Copas, MD 05/29/2022 5:37 PM  For on call review [email or web address].

## 2022-05-29 NOTE — Progress Notes (Signed)
Progress Note  Patient Name: [name] Date of Encounter: 05/29/2022  Primary Cardiologist:   Jenne Campus, MD   Subjective   Abdominal pain controlled.  No SOB.   Inpatient Medications    Scheduled Meds:  acetaminophen  1,000 mg Oral Q6H   carvedilol  12.5 mg Oral BID WC   Chlorhexidine Gluconate Cloth  6 each Topical Daily   feeding supplement  1 Container Oral TID BM   insulin aspart  0-9 Units Subcutaneous Q4H   insulin glargine-yfgn  8 Units Subcutaneous QHS   methocarbamol  500 mg Oral Q6H   sodium chloride flush  10 mL Intracatheter Q8H   Continuous Infusions:  argatroban 0.72 mcg/kg/min (05/29/22 0507)   lactated ringers 50 mL/hr at 05/29/22 0233   piperacillin-tazobactam (ZOSYN)  IV 3.375 g (05/29/22 0819)   PRN Meds: melatonin, morphine injection, nitroGLYCERIN, ondansetron **OR** ondansetron (ZOFRAN) IV, oxyCODONE   Vital Signs    Vitals:   05/28/22 2007 05/29/22 0427 05/29/22 0825 05/29/22 0915  BP: 117/67 (!) 143/80 (!) 142/85 (!) 147/77  Pulse: 61 77 87 82  Resp: 16 18 18 18  $ Temp: (!) 97.4 F (36.3 C) 98 F (36.7 C)  98.4 F (36.9 C)  TempSrc: Oral     SpO2: 96% 94% 94% 96%  Weight:      Height:        Intake/Output Summary (Last 24 hours) at 05/29/2022 0952 Last data filed at 05/29/2022 0819 Gross per 24 hour  Intake 2564.61 ml  Output 1500 ml  Net 1064.61 ml   Filed Weights   05/24/22 1952 05/24/22 2000 05/26/22 0829  Weight: 70.4 kg 70.4 kg 70.4 kg    Telemetry    NSR - Personally Reviewed  ECG    NA - Personally Reviewed  Physical Exam   GENERAL:  Well appearing NECK:  No jugular venous distention, waveform within normal limits, carotid upstroke brisk and symmetric, no bruits, no thyromegaly LUNGS:  Clear to auscultation bilaterally CHEST:  Unremarkable HEART:  PMI not displaced or sustained,S1 and S2 within normal limits, no S3, no S4, no clicks, no rubs, no murmurs ABD:  Flat, positive bowel sounds normal in  frequency in pitch, no bruits, no rebound, no guarding, no midline pulsatile mass, no hepatomegaly, no splenomegaly EXT:  2 plus pulses throughout, no edema, no cyanosis no clubbing   Labs    Chemistry Recent Labs  Lab 05/26/22 0347 05/26/22 1125 05/27/22 0325 05/28/22 0410  NA 133* 134* 132* 134*  K 4.5 4.3 4.1 3.4*  CL 97*  --  99 102  CO2 20*  --  18* 23  GLUCOSE 134*  --  203* 232*  BUN 16  --  18 13  CREATININE 1.42*  --  1.37* 1.12  CALCIUM 8.2*  --  8.0* 8.2*  PROT 5.2*  --  5.4* 5.3*  ALBUMIN 1.9*  --  2.4* 2.2*  AST 16  --  15 16  ALT 13  --  11 10  ALKPHOS 37*  --  29* 29*  BILITOT 1.0  --  1.5* 0.8  GFRNONAA 51*  --  53* >60  ANIONGAP 16*  --  15 9     Hematology Recent Labs  Lab 05/26/22 0347 05/26/22 1125 05/27/22 0325 05/28/22 0410  WBC 10.6*  --  9.9 9.4  RBC 3.07*  --  3.86* 3.87*  HGB 8.5* 9.9* 11.1* 11.1*  HCT 26.2* 29.0* 33.1* 33.1*  MCV 85.3  --  85.8 85.5  MCH 27.7  --  28.8 28.7  MCHC 32.4  --  33.5 33.5  RDW 17.8*  --  16.8* 17.0*  PLT 412*  --  395 412*    Cardiac EnzymesNo results for input(s): "TROPONINI" in the last 168 hours. No results for input(s): "TROPIPOC" in the last 168 hours.   BNPNo results for input(s): "BNP", "PROBNP" in the last 168 hours.   DDimer No results for input(s): "DDIMER" in the last 168 hours.   Radiology    No results found.  Cardiac Studies   Echo: 11/20203  1. Left ventricular ejection fraction by PLAX is 25 %. The left ventricle has  severely decreased function. The left ventricle demonstrates global  hypokinesis. The left ventricular internal   cavity size was mildly dilated. Left ventricular diastolic parameters are  consistent with Grade II diastolic dysfunction (pseudonormalization).  Elevated left atrial pressure. The average left ventricular global  longitudinal strain is -4.9 %. The global  longitudinal strain is abnormal.   2. Right ventricular systolic function is mildly reduced. The  right  ventricular size is normal. There is normal pulmonary artery systolic  pressure.   3. Left atrial size was mildly dilated.   4. The mitral valve is degenerative. Mild mitral valve regurgitation. No  evidence of mitral stenosis.   5. The aortic valve is normal in structure. Aortic valve regurgitation is  not visualized. No aortic stenosis is present.   6. Aortic Normal DTA.   7. The inferior vena cava is normal in size with greater than 50%  respiratory variability, suggesting right atrial pressure of 3 mmHg.   Patient Profile     77 y.o. male with PMH of CAD s/p stents and CTO of RCA, paroxysmal atrial fibrillation on Xarelto, COPD, chronic systolic heart failure, ischemic cardiomyopathy, hyperlipidemia, hypertension, diabetes mellitus, ongoing tobacco abuse and PAD, who is admitted for diverticulitis of large intestine with abscess, cardiology was consulted for pre-op risk evaluation.   Assessment & Plan    Paroxysmal atrial fibrillation:  Continuing to hold Xarelto and ASA .  Resume when OK with surgery.  Maintaining NSR.     Chronic anticoagulation:  As above. Restart when OK with surgery.     Chronic systolic heart failure:  EF in Nov was 25%.  Please note that there is an error on the report and that the EF is severely reduced.  He has tongue and lip swelling to ACE inhibitor.  Will avoid ACE/ARB and start spironolactone.  Farxiga before discharge and BiDil.      CAD:  No evidence of active ischemia.     Hypokalemia:  Supplemented.       For questions or updates, please contact [place] Please consult [email or web address] for contact info under Cardiology/STEMI.   Signed, Minus Breeding, MD  05/29/2022, 9:52 AM

## 2022-05-29 NOTE — Progress Notes (Signed)
Mobility Specialist Progress Note   05/29/22 1045  Mobility  Activity Ambulated with assistance in hallway  Level of Assistance Minimal assist, patient does 75% or more  Assistive Device Front wheel walker  Distance Ambulated (ft) 470 ft  Activity Response Tolerated well  Mobility Referral Yes  $Mobility charge 1 Mobility   Pt agreeable to mobility. MinA to EOB and MinG during ambulation + verbal cues on posture. Returned back to bed w/o fault and all needs met.   Holland Falling Mobility Specialist Please contact via SecureChat or  Rehab office at [phone]

## 2022-05-29 NOTE — Plan of Care (Signed)

## 2022-05-30 ENCOUNTER — Other Ambulatory Visit (HOSPITAL_COMMUNITY): Payer: Self-pay

## 2022-05-30 DIAGNOSIS — I2510 Atherosclerotic heart disease of native coronary artery without angina pectoris: Secondary | ICD-10-CM | POA: Diagnosis not present

## 2022-05-30 DIAGNOSIS — K5720 Diverticulitis of large intestine with perforation and abscess without bleeding: Secondary | ICD-10-CM | POA: Diagnosis not present

## 2022-05-30 DIAGNOSIS — I5022 Chronic systolic (congestive) heart failure: Secondary | ICD-10-CM | POA: Diagnosis not present

## 2022-05-30 LAB — CBC
HCT: 33.1 % — ABNORMAL LOW (ref 39.0–52.0)
Hemoglobin: 10.9 g/dL — ABNORMAL LOW (ref 13.0–17.0)
MCH: 28.7 pg (ref 26.0–34.0)
MCHC: 32.9 g/dL (ref 30.0–36.0)
MCV: 87.1 fL (ref 80.0–100.0)
Platelets: 476 10*3/uL — ABNORMAL HIGH (ref 150–400)
RBC: 3.8 MIL/uL — ABNORMAL LOW (ref 4.22–5.81)
RDW: 17.6 % — ABNORMAL HIGH (ref 11.5–15.5)
WBC: 9.8 10*3/uL (ref 4.0–10.5)
nRBC: 0 % (ref 0.0–0.2)

## 2022-05-30 LAB — COMPREHENSIVE METABOLIC PANEL
ALT: 12 U/L (ref 0–44)
AST: 14 U/L — ABNORMAL LOW (ref 15–41)
Albumin: 1.8 g/dL — ABNORMAL LOW (ref 3.5–5.0)
Alkaline Phosphatase: 34 U/L — ABNORMAL LOW (ref 38–126)
Anion gap: 9 (ref 5–15)
BUN: 10 mg/dL (ref 8–23)
CO2: 25 mmol/L (ref 22–32)
Calcium: 8.4 mg/dL — ABNORMAL LOW (ref 8.9–10.3)
Chloride: 106 mmol/L (ref 98–111)
Creatinine, Ser: 1.02 mg/dL (ref 0.61–1.24)
GFR, Estimated: >60 mL/min (ref >60)
Glucose, Bld: 112 mg/dL — ABNORMAL HIGH (ref 70–99)
Potassium: 4.4 mmol/L (ref 3.5–5.1)
Sodium: 140 mmol/L (ref 135–145)
Total Bilirubin: 0.3 mg/dL (ref 0.3–1.2)
Total Protein: 4.8 g/dL — ABNORMAL LOW (ref 6.5–8.1)

## 2022-05-30 LAB — TYPE AND SCREEN
ABO/RH(D): O NEG
Antibody Screen: NEGATIVE
Unit division: 0
Unit division: 0
Unit division: 0
Unit division: 0

## 2022-05-30 LAB — BPAM RBC
Blood Product Expiration Date: 202402252359
Blood Product Expiration Date: 202402262359
Blood Product Expiration Date: 202403102359
Blood Product Expiration Date: 202403112359
ISSUE DATE / TIME: 202402071756
ISSUE DATE / TIME: 202402071756
ISSUE DATE / TIME: 202402080910
ISSUE DATE / TIME: 202402080910
Unit Type and Rh: 5100
Unit Type and Rh: 5100
Unit Type and Rh: 9500
Unit Type and Rh: 9500

## 2022-05-30 LAB — SURGICAL PATHOLOGY

## 2022-05-30 LAB — GLUCOSE, CAPILLARY
Glucose-Capillary: 110 mg/dL — ABNORMAL HIGH (ref 70–99)
Glucose-Capillary: 110 mg/dL — ABNORMAL HIGH (ref 70–99)
Glucose-Capillary: 129 mg/dL — ABNORMAL HIGH (ref 70–99)
Glucose-Capillary: 129 mg/dL — ABNORMAL HIGH (ref 70–99)
Glucose-Capillary: 137 mg/dL — ABNORMAL HIGH (ref 70–99)
Glucose-Capillary: 165 mg/dL — ABNORMAL HIGH (ref 70–99)
Glucose-Capillary: 181 mg/dL — ABNORMAL HIGH (ref 70–99)

## 2022-05-30 LAB — APTT: aPTT: 46 seconds — ABNORMAL HIGH (ref 24–36)

## 2022-05-30 MED ORDER — SACUBITRIL-VALSARTAN 24-26 MG PO TABS
1.0000 | ORAL_TABLET | Freq: Two times a day (BID) | ORAL | Status: DC
  Administered 2022-05-30 – 2022-06-03 (×9): 1 via ORAL
  Filled 2022-05-30 (×10): qty 1

## 2022-05-30 MED ORDER — ALUM & MAG HYDROXIDE-SIMETH 200-200-20 MG/5ML PO SUSP
30.0000 mL | ORAL | Status: DC | PRN
  Administered 2022-05-30 – 2022-06-01 (×3): 30 mL via ORAL
  Filled 2022-05-30 (×4): qty 30

## 2022-05-30 MED ORDER — AMLODIPINE BESYLATE 5 MG PO TABS
5.0000 mg | ORAL_TABLET | Freq: Every day | ORAL | Status: DC
  Administered 2022-05-30 – 2022-06-03 (×5): 5 mg via ORAL
  Filled 2022-05-30 (×5): qty 1

## 2022-05-30 MED ORDER — ALUM & MAG HYDROXIDE-SIMETH 200-200-20 MG/5 ML NICU TOPICAL: 1.0000 | Freq: Four times a day (QID) | TOPICAL | Status: DC | PRN

## 2022-05-30 MED ORDER — RIVAROXABAN 20 MG PO TABS
20.0000 mg | ORAL_TABLET | Freq: Every day | ORAL | Status: DC
  Administered 2022-05-30 – 2022-06-02 (×4): 20 mg via ORAL
  Filled 2022-05-30 (×4): qty 1

## 2022-05-30 MED ORDER — ENSURE ENLIVE PO LIQD
237.0000 mL | Freq: Three times a day (TID) | ORAL | Status: DC
  Administered 2022-05-30 – 2022-06-03 (×7): 237 mL via ORAL

## 2022-05-30 NOTE — Progress Notes (Addendum)
Rounding Note    Patient Name: [name] Date of Encounter: 05/30/2022  [place] Cardiologist: Jenne Campus, MD   Subjective   Patient is feeling well this morning. No chest pain, palpitations, shortness of breath.  Inpatient Medications    Scheduled Meds:  acetaminophen  1,000 mg Oral Q6H   carvedilol  12.5 mg Oral BID WC   Chlorhexidine Gluconate Cloth  6 each Topical Daily   feeding supplement  1 Container Oral TID BM   insulin aspart  0-9 Units Subcutaneous Q4H   insulin glargine-yfgn  8 Units Subcutaneous QHS   methocarbamol  500 mg Oral Q6H   sodium chloride flush  10 mL Intracatheter Q8H   spironolactone  25 mg Oral Daily   Continuous Infusions:  argatroban 0.8 mcg/kg/min (05/30/22 0900)   lactated ringers 50 mL/hr at 05/29/22 2335   piperacillin-tazobactam (ZOSYN)  IV 3.375 g (05/30/22 0841)   PRN Meds: calcium carbonate, melatonin, morphine injection, nitroGLYCERIN, ondansetron **OR** ondansetron (ZOFRAN) IV, oxyCODONE   Vital Signs    Vitals:   05/29/22 1531 05/29/22 2016 05/30/22 0458 05/30/22 0835  BP: (!) 125/59 (!) 148/82 (!) 147/84 (!) 163/75  Pulse:  73 77 81  Resp: 20 18 18   $ Temp: 97.6 F (36.4 C) 98.1 F (36.7 C) 97.6 F (36.4 C)   TempSrc: Oral Oral Oral   SpO2: 95% 95% 97% 96%  Weight:      Height:        Intake/Output Summary (Last 24 hours) at 05/30/2022 1045 Last data filed at 05/30/2022 0841 Gross per 24 hour  Intake 1941.3 ml  Output 1515 ml  Net 426.3 ml      05/26/2022    8:29 AM 05/24/2022    8:00 PM 05/24/2022    7:52 PM  Last 3 Weights  Weight (lbs) 155 lb 3.3 oz 155 lb 3.3 oz 155 lb 3.3 oz  Weight (kg) 70.4 kg 70.4 kg 70.4 kg      Telemetry    Sinus rhythm with PVCs - Personally Reviewed  ECG    No new tracing - Personally Reviewed  Physical Exam   GEN: No acute distress.   Neck: No JVD Cardiac: RRR, no murmurs, rubs, or gallops.  Respiratory: Clear to auscultation bilaterally. GI: Soft,  nontender, non-distended. Abdominal incision with dressing in place. Colostomy noted MS: No edema; No deformity. Neuro:  Nonfocal  Psych: Normal affect   Labs    High Sensitivity Troponin:   Recent Labs  Lab 05/25/22 1827 05/25/22 1909  TROPONINIHS 25* 23*     Chemistry Recent Labs  Lab 05/28/22 0410 05/29/22 1135 05/30/22 0318  NA 134* 135 140  K 3.4* 3.8 4.4  CL 102 105 106  CO2 23 23 25  $ GLUCOSE 232* 198* 112*  BUN 13 11 10  $ CREATININE 1.12 1.10 1.02  CALCIUM 8.2* 8.1* 8.4*  MG 2.0 1.7  --   PROT 5.3* 4.9* 4.8*  ALBUMIN 2.2* 2.0* 1.8*  AST 16 14* 14*  ALT 10 11 12  $ ALKPHOS 29* 31* 34*  BILITOT 0.8 0.7 0.3  GFRNONAA >60 >60 >60  ANIONGAP 9 7 9    $ Lipids No results for input(s): "CHOL", "TRIG", "HDL", "LABVLDL", "LDLCALC", "CHOLHDL" in the last 168 hours.  Hematology Recent Labs  Lab 05/28/22 0410 05/29/22 1135 05/30/22 0318  WBC 9.4 11.5* 9.8  RBC 3.87* 4.12* 3.80*  HGB 11.1* 11.6* 10.9*  HCT 33.1* 36.4* 33.1*  MCV 85.5 88.3 87.1  MCH 28.7  28.2 28.7  MCHC 33.5 31.9 32.9  RDW 17.0* 17.5* 17.6*  PLT 412* 484* 476*   Thyroid  Recent Labs  Lab 05/24/22 2210  TSH 6.794*  FREET4 1.31*    BNPNo results for input(s): "BNP", "PROBNP" in the last 168 hours.  DDimer No results for input(s): "DDIMER" in the last 168 hours.   Radiology    No results found.  Cardiac Studies   R/LHC on 10/16/17:   Prox RCA lesion is 100% stenosed. Mid RCA to Dist RCA lesion is 100% stenosed. Ost LM to Mid LM lesion is 30% stenosed. Ost LAD to Prox LAD lesion is 90% stenosed. A drug-eluting stent was successfully placed using a STENT SYNERGY DES 3X28. Post intervention, there is a 0% residual stenosis. LV end diastolic pressure is normal.   1. Chronic occlusion RCA with distal vessel filling from left to right collaterals.  2. Severe stenosis proximal LAD in the old stented segment (bare metal stent was placed in 2000).  3. Successful PTCA/DES x  Proximal LAD 4.  Normal filling pressures  Echo 02/28/2022:  1. Left ventricular ejection fraction, by estimation, is 60 to 65%. Left  ventricular ejection fraction by PLAX is 25 %. The left ventricle has  severely decreased function. The left ventricle demonstrates global  hypokinesis. The left ventricular internal   cavity size was mildly dilated. Left ventricular diastolic parameters are  consistent with Grade II diastolic dysfunction (pseudonormalization).  Elevated left atrial pressure. The average left ventricular global  longitudinal strain is -4.9 %. The global  longitudinal strain is abnormal.   2. Right ventricular systolic function is mildly reduced. The right  ventricular size is normal. There is normal pulmonary artery systolic  pressure.   3. Left atrial size was mildly dilated.   4. The mitral valve is degenerative. Mild mitral valve regurgitation. No  evidence of mitral stenosis.   5. The aortic valve is normal in structure. Aortic valve regurgitation is  not visualized. No aortic stenosis is present.   6. Aortic Normal DTA.   7. The inferior vena cava is normal in size with greater than 50%  respiratory variability, suggesting right atrial pressure of 3 mmHg.   Patient Profile     [name] is a 77 y.o. male with a hx of PAF, chronic anticoagulation, COPD, chronic systolic heart failure/ICM, HLD, HTN, DM2, ongoing tobacco abuse, and PAD who is being seen 05/25/2022 for the evaluation of preoperative risk evaluation at the request of Dr. Wyline Copas. Patient found with diverticulitis of large intestine with abscess.  Assessment & Plan    Chronic systolic CHF Ischemic cardiomyopathy HTN  Last echocardiogram in November 2023 with LVEF ~25%, grade II DD, mild RV dysfunction with mild MR. Patient POD 4 from ex lap with takedown of splenic flexure, partial colectomy, colostomy creation.  Remains euvolemic on exam GDMT: Entresto: Resume today Continue coreg 12.244m BID Spironolactone  282mDiscussed SGLT2i with patient. Either is $45 copay. Patient wishes to consider affordability prior to starting.  CAD, hx PCI  No chest pain or evidence of acute/active ischemia. Resume ASA per surgical clearance.   Paroxysmal atrial fibrillation CHADs2-VASc score: 6  Patient remains in NSR. Continue Coreg 12.44m8mID. Resume Xarelto and ASA per surgery clearance. Argatroban per pharmacy until able to resume Xarelto  Hypertension  Resume Entresto and Amlodipine. Continue Coreg.      For questions or updates, please contact [facility] consult [email or web address] for contact info under  Signed, Lily Kocher, PA-C  05/30/2022, 10:45 AM    I have seen and examined the patient along with Lily Kocher, PA-C .  I have reviewed the chart, notes and new data.  I agree with PA/NP's note.  Key new complaints: no angina or dyspnea Key examination changes: no overt hypervolemia Key new findings / data: normal electrolytes and renal parameters  PLAN: Resume his usual HF meds. Can start SGLT2inh as an outpatient. Would prefer to replace amlodipine with higher entresto dose, unless amlodipine has been helping as an antianginal.  Sanda Klein, MD, [place] [phone] 05/30/2022, 1:42 PM

## 2022-05-30 NOTE — Progress Notes (Signed)
Progress Note   Patient: [name] [id number] DOB: 15-Oct-1945 DOA: 05/24/2022     6 DOS: the patient was seen and examined on 05/30/2022   Brief hospital course: 77 y.o. male with medical history significant of PAF, CAD s/p stent, ICM with EF of 25% as of Nov 2023, PAD s/p aorto bifembypass in 1992 and fem-fem bypass (maybe with fem-pop) by Dr. Donnetta Hutching in 2011, HTN, DM2, COPD quit smoking in 2022.   Pt with diverticulitis with abscess, initially on Apr 08, 2022.  Had IR drain placed.  Seemed to be doing better for month of Jan, drainage had basically stopped.  But then sudden worsening of abd pain and increased drain output saw him admitted to [facility] on 2/1.  IR repositioned drain on 2/2 but had ongoing feculent output despite this and ABx.  Ultimately surgery offered on 2/6 but family wanted him transferred for the surgery to larger hospital.  Pt transferred to [facility]  Assessment and Plan: * Diverticulitis of large intestine with abscess Failed medical treatment and IR drainage attempts x2 with ongoing feculant output around drain, 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 Surgery offered at [facility], however family had requested transfer to larger hospital, thus pt was transferred to [facility] -General Surgery following, now s/p surgery 2/8 -Advancing diet per Surgery. -IR drain in place. Cont IV abx. Per surgery, possible stable for d/c as early as 2/13 -Pt and family is now in agreement for SNF. TOC following   Hypothyroidism Question of new diagnosis of hypothyroidism: -TSH 6.794 with free T4 of 1.31   AKI (acute kidney injury) ([place]) Creat at [facility] during admit: 1.6->1.0-> 1.4 Cr 1.02 -had been continued on basal IVF -Recheck bmet in AM   SYSTOLIC HEART FAILURE, CHRONIC Cont BB Reportedly did require dose of lasix for concerns of volume overload on 2/3 at [facility] Entresto resumed Spironolactone continued   DM2 (diabetes mellitus, type 2) (HCC) Glucose in the mid-200's this afternoon Currently  on semglee to 10 units moderate SSI Q4H    Paroxysmal atrial fibrillation (HCC) Reportedly ran out of xarelto at home last month -held [place] for surgery this visit, on argatrogan bridge -discussed with General Surgery, OK to resume home xarelto  CAD with known RCA occlusion -Cardiology following -Recs to eventually d/c on ASA and xarelto when ultimately OK with General Surgery post-op -cont BB -Per cardiology, given hx of tongue and lip swelling to ACEI, will avoid ACEI/ARB. Plan for spironolactone and Farxiga before discharge with BiDil  Subjective: Feeling much better today  Physical Exam: Vitals:   05/29/22 1531 05/29/22 2016 05/30/22 0458 05/30/22 0835  BP: (!) 125/59 (!) 148/82 (!) 147/84 (!) 163/75  Pulse:  73 77 81  Resp: 20 18 18   $ Temp: 97.6 F (36.4 C) 98.1 F (36.7 C) 97.6 F (36.4 C)   TempSrc: Oral Oral Oral   SpO2: 95% 95% 97% 96%  Weight:      Height:       General exam: Conversant, in no acute distress Respiratory system: normal chest rise, clear, no audible wheezing Cardiovascular system: regular rhythm, s1-s2 Gastrointestinal system: Nondistended, nontender, pos BS Central nervous system: No seizures, no tremors Extremities: No cyanosis, no joint deformities Skin: No rashes, no pallor Psychiatry: Affect normal // no auditory hallucinations   Data Reviewed:  Labs reviewed: Na Na 140, K 4.4, Cr 1.02, Hgb 10.9   Family Communication: Pt in room, family not at bedside  Disposition: Status is: Inpatient Remains inpatient appropriate because: Severity  of illness  Planned Discharge Destination: Home    Author: Marylu Lund, MD 05/30/2022 3:12 PM  For on call review [email or web address].

## 2022-05-30 NOTE — TOC Progression Note (Addendum)
Transition of Care [facility]) - Initial/Assessment Note    Patient Details  Name: [name] MRN: [id number] Date of Birth: 05/04/1945  Transition of [facility]) CM/SW Contact:    Milinda Antis, LCSWA Phone Number: 05/30/2022, 1:47 PM  Clinical Narrative:                 LCSW notified that patient's daughter is requesting SNF.  LCSW contacted the daughter.  There was no answer.  LCSW left a VM requesting  a returned call.    1415- LCSW received a returned call from the patient's daughter.  The daughter is requesting a SNF as she is "on the road" for work a lot and will not be home to assist the patient "24/7".  LCSW explained that PT has changed their recommendation from SNF to home health and there is a chance that insurance will not approve SNF.  The daughter stated that she understood, but requested that CSW send the SNF referral to facilities in [place] and [place].    TOC following.   TOC following.     Barriers to Discharge: Continued Medical Work up   Patient Goals and CMS Choice Patient states their goals for this hospitalization and ongoing recovery are:: To return home CMS Medicare.gov Compare Post Acute Care list provided to:: Patient Choice offered to / list presented to : Patient, Adult Children (Daughter, Teacher, music)      Expected Discharge Plan and Services   Discharge Planning Services: CM Consult   Living arrangements for the past 2 months: Single Family Home                                      Prior Living Arrangements/Services Living arrangements for the past 2 months: Single Family Home Lives with:: Self Patient language and need for interpreter reviewed:: Yes Do you feel safe going back to the place where you live?: Yes      Need for Family Participation in Patient Care: Yes (Comment) Care giver support system in place?: Yes (comment)   Criminal Activity/Legal Involvement Pertinent to Current Situation/Hospitalization: No - Comment as  needed  Activities of Daily Living Home Assistive Devices/Equipment: Dentures (specify type) ADL Screening (condition at time of admission) Patient's cognitive ability adequate to safely complete daily activities?: Yes Is the patient deaf or have difficulty hearing?: Yes Does the patient have difficulty seeing, even when wearing glasses/contacts?: No Does the patient have difficulty concentrating, remembering, or making decisions?: No Patient able to express need for assistance with ADLs?: Yes Does the patient have difficulty dressing or bathing?: No Independently performs ADLs?: Yes (appropriate for developmental age) Does the patient have difficulty walking or climbing stairs?: Yes Weakness of Legs: Both Weakness of Arms/Hands: None  Permission Sought/Granted Permission sought to share information with : Case Manager, Family Supports Permission granted to share information with : Yes, Verbal Permission Granted              Emotional Assessment Appearance:: Appears stated age Attitude/Demeanor/Rapport: Engaged, Gracious Affect (typically observed): Accepting, Appropriate, Calm, Hopeful, Pleasant Orientation: : Oriented to Self, Oriented to Place, Oriented to  Time, Oriented to Situation Alcohol / Substance Use: Not Applicable Psych Involvement: No (comment)  Admission diagnosis:  Diverticulitis of large intestine with abscess [K57.20] Patient Active Problem List   Diagnosis Date Noted   Protein-calorie malnutrition, severe 05/29/2022   Diverticulitis of large intestine with abscess 05/24/2022  AKI (acute kidney injury) ([place]) 05/24/2022   Hypothyroidism 05/24/2022   Need for prophylactic vaccination and inoculation against influenza 03/28/2022   Lethargy 03/28/2022   Current moderate episode of major depressive disorder without prior episode ([place]) 03/28/2022   Bladder cancer ([place]) [id number]   Chronic systolic (congestive) heart failure ([place]) 02/09/2022   Cigarette smoker  02/09/2022   Coronary artery disease 02/09/2022   Ischemic cardiomyopathy 02/09/2022   Lumbar spinal stenosis 02/09/2022   Myocardial infarction ([place]) 02/09/2022   NSVT (nonsustained ventricular tachycardia) ([place]) 02/09/2022   PAF (paroxysmal atrial fibrillation) ([place]) 02/09/2022   PVC's (premature ventricular contractions) 02/09/2022   DM2 (diabetes mellitus, type 2) ([place]) 02/09/2022   Diverticulitis large intestine 05/11/2021   Status post coronary artery stent placement    Unstable angina ([place]) 10/16/2017   Coronary artery disease involving native coronary artery of native heart with unstable angina pectoris ([place])    Acute low back pain 01/12/2015   Atherosclerosis of native arteries of the extremities with intermittent claudication 08/13/2013   Backache 02/08/2013   TOBACCO ABUSE 05/29/2009   HYPERLIPIDEMIA-MIXED 09/22/2008   HYPERTENSION, BENIGN 09/22/2008   CAD, NATIVE VESSEL 0000000   SYSTOLIC HEART FAILURE, CHRONIC 09/22/2008   PVD 09/22/2008   PCP:  Garwin Brothers, MD Pharmacy:   [facility] [place], [address] AT [place] [id number] [place] [address] Phone: [phone] Fax: [phone]     Social Determinants of Health (SDOH) Social History: SDOH Screenings   Food Insecurity: No Food Insecurity (05/24/2022)  Housing: Low Risk  (05/24/2022)  Transportation Needs: No Transportation Needs (05/24/2022)  Utilities: Not At Risk (05/24/2022)  Depression (PHQ2-9): High Risk (03/28/2022)  Tobacco Use: Medium Risk (05/27/2022)   SDOH Interventions: Transportation Interventions: Intervention Not Indicated, Inpatient TOC, Patient Resources (Friends/Family)   Readmission Risk Interventions     No data to display

## 2022-05-30 NOTE — NC FL2 (Signed)
[place] LEVEL OF CARE FORM     IDENTIFICATION  Patient Name: [name] Birthdate: May 13, 1945 Sex: male Admission Date (Current Location): 05/24/2022  [facility] and [place] Number:  Herbalist and Address:  [facility], [place] [address] [place], [place] [address]      Provider Number: [id number]  Attending Physician Name and Address:  Donne Hazel, MD  Relative Name and Phone Number:  Corrigan, Wanek (Daughter) [phone]    Current Level of Care: Hospital Recommended Level of Care: [place] Prior Approval Number:    Date Approved/Denied:   PASRR Number: [id number]  Discharge Plan: SNF    Current Diagnoses: Patient Active Problem List   Diagnosis Date Noted   Protein-calorie malnutrition, severe 05/29/2022   Diverticulitis of large intestine with abscess 05/24/2022   AKI (acute kidney injury) ([place]) 05/24/2022   Hypothyroidism 05/24/2022   Need for prophylactic vaccination and inoculation against influenza 03/28/2022   Lethargy 03/28/2022   Current moderate episode of major depressive disorder without prior episode ([place]) 03/28/2022   Bladder cancer ([place]) [id number]   Chronic systolic (congestive) heart failure ([place]) 02/09/2022   Cigarette smoker 02/09/2022   Coronary artery disease 02/09/2022   Ischemic cardiomyopathy 02/09/2022   Lumbar spinal stenosis 02/09/2022   Myocardial infarction ([place]) 02/09/2022   NSVT (nonsustained ventricular tachycardia) ([place]) 02/09/2022   PAF (paroxysmal atrial fibrillation) ([place]) 02/09/2022   PVC's (premature ventricular contractions) 02/09/2022   DM2 (diabetes mellitus, type 2) ([place]) 02/09/2022   Diverticulitis large intestine 05/11/2021   Status post coronary artery stent placement    Unstable angina (HCC) 10/16/2017   Coronary artery disease involving native coronary artery of native heart with unstable angina pectoris (HCC)    Acute low back pain 01/12/2015    Atherosclerosis of native arteries of the extremities with intermittent claudication 08/13/2013   Backache 02/08/2013   TOBACCO ABUSE 05/29/2009   HYPERLIPIDEMIA-MIXED 09/22/2008   HYPERTENSION, BENIGN 09/22/2008   CAD, NATIVE VESSEL 0000000   SYSTOLIC HEART FAILURE, CHRONIC 09/22/2008   PVD 09/22/2008    Orientation RESPIRATION BLADDER Height & Weight     Self, Time, Situation, Place  Normal Continent Weight: 155 lb 3.3 oz (70.4 kg) Height:  5' 7"$  (170.2 cm)  BEHAVIORAL SYMPTOMS/MOOD NEUROLOGICAL BOWEL NUTRITION STATUS      Colostomy Diet (see d/c summary)  AMBULATORY STATUS COMMUNICATION OF NEEDS Skin   Supervision Verbally Surgical wounds (abdomen)                       Personal Care Assistance Level of Assistance  Bathing, Feeding, Dressing Bathing Assistance: Limited assistance Feeding assistance: Independent Dressing Assistance: Independent     Functional Limitations Info  Sight, Hearing, Speech Sight Info: Adequate Hearing Info: Impaired Speech Info: Adequate    SPECIAL CARE FACTORS FREQUENCY  PT (By licensed PT), OT (By licensed OT)     PT Frequency: 5x/ week OT Frequency: 5x/ week            Contractures Contractures Info: Not present    Additional Factors Info  Code Status, Allergies, Insulin Sliding Scale Code Status Info: Full Allergies Info: Lisinopril   Insulin Sliding Scale Info: see d/c summary       Current Medications (05/30/2022):  This is the current hospital active medication list Current Facility-Administered Medications  Medication Dose Route Frequency Provider Last Rate Last Admin   acetaminophen (TYLENOL) tablet 1,000 mg  1,000 mg Oral Q6H Saverio Danker, PA-C  1,000 mg at 05/30/22 1246   amLODipine (NORVASC) tablet 5 mg  5 mg Oral Daily Lily Kocher, PA-C   5 mg at 05/30/22 1247   argatroban 1 mg/mL infusion  0.8 mcg/kg/min Intravenous Continuous Dimple Nanas, RPH 3.38 mL/hr at 05/30/22 0900 0.8 mcg/kg/min at  05/30/22 0900   calcium carbonate (TUMS - dosed in mg elemental calcium) chewable tablet 200 mg of elemental calcium  1 tablet Oral TID PRN Donne Hazel, MD   200 mg of elemental calcium at 05/30/22 0538   carvedilol (COREG) tablet 12.5 mg  12.5 mg Oral BID WC Meuth, Brooke A, PA-C   12.5 mg at 05/30/22 [id number]   Chlorhexidine Gluconate Cloth 2 % PADS 6 each  6 each Topical Daily Donne Hazel, MD   6 each at 05/30/22 0912   feeding supplement (ENSURE ENLIVE / ENSURE PLUS) liquid 237 mL  237 mL Oral TID WC [place], Elizabeth S, PA-C       insulin aspart (novoLOG) injection 0-9 Units  0-9 Units Subcutaneous Q4H Meuth, Brooke A, PA-C   2 Units at 05/30/22 1318   insulin glargine-yfgn (SEMGLEE) injection 8 Units  8 Units Subcutaneous QHS Donne Hazel, MD   8 Units at 05/29/22 2027   lactated ringers infusion   Intravenous Continuous Jesusita Oka, MD 50 mL/hr at 05/29/22 2335 New Bag at 05/29/22 2335   melatonin tablet 3 mg  3 mg Oral QHS PRN Meuth, Brooke A, PA-C   3 mg at 05/29/22 2028   methocarbamol (ROBAXIN) tablet 500 mg  500 mg Oral Q6H Saverio Danker, PA-C   500 mg at 05/30/22 [id number]   morphine (PF) 2 MG/ML injection 2-4 mg  2-4 mg Intravenous Q3H PRN Meuth, Brooke A, PA-C   3 mg at 05/30/22 0908   nitroGLYCERIN (NITROSTAT) SL tablet 0.4 mg  0.4 mg Sublingual Q5 min PRN Meuth, Brooke A, PA-C   0.4 mg at 05/25/22 2057   ondansetron (ZOFRAN) tablet 4 mg  4 mg Oral Q6H PRN Meuth, Brooke A, PA-C   4 mg at 05/29/22 1356   Or   ondansetron (ZOFRAN) injection 4 mg  4 mg Intravenous Q6H PRN Meuth, Brooke A, PA-C   4 mg at 05/29/22 2027   oxyCODONE (Oxy IR/ROXICODONE) immediate release tablet 5-10 mg  5-10 mg Oral Q4H PRN Jesusita Oka, MD   10 mg at 05/30/22 [id number]   sacubitril-valsartan (ENTRESTO) 24-26 mg per tablet  1 tablet Oral BID Lily Kocher, PA-C   1 tablet at 05/30/22 1247   sodium chloride flush (NS) 0.9 % injection 10 mL  10 mL Intracatheter Q8H Meuth, Brooke A, PA-C   10 mL at  05/30/22 0913   spironolactone (ALDACTONE) tablet 25 mg  25 mg Oral Daily Minus Breeding, MD   25 mg at 05/30/22 [id number]     Discharge Medications: Please see discharge summary for a list of discharge medications.  Relevant Imaging Results:  Relevant Lab Results:   Additional Information SSN: [id number] [id number]  Chaunice Obie F Etta Gassett, LCSWA

## 2022-05-30 NOTE — Progress Notes (Signed)
ANTICOAGULATION CONSULT NOTE - Follow Up Consult  Pharmacy Consult for Argatroban Indication: atrial fibrillation  Allergies  Allergen Reactions   Lisinopril Swelling and Rash    Rash - face and tounge swell    Patient Measurements: Height: 5' 7"$  (170.2 cm) Weight: 70.4 kg (155 lb 3.3 oz) IBW/kg (Calculated) : 66.1 Argatroban Dosing Weight: 70.4 kg  Vital Signs: Temp: 97.6 F (36.4 C) (02/12 0458) Temp Source: Oral (02/12 0458) BP: 147/84 (02/12 0458) Pulse Rate: 77 (02/12 0458)  Labs: Recent Labs    05/27/22 1157 05/27/22 2239 05/28/22 0410 05/28/22 1146 05/28/22 1614 05/29/22 1135 05/30/22 0318  HGB  --    < > 11.1*  --   --  11.6* 10.9*  HCT  --   --  33.1*  --   --  36.4* 33.1*  PLT  --   --  412*  --   --  484* 476*  APTT  --    < > 93*   < > 92* 71* 46*  HEPARINUNFRC 0.11*  --   --   --   --   --   --   CREATININE  --   --  1.12  --   --  1.10 1.02   < > = values in this interval not displayed.     Estimated Creatinine Clearance: 56.7 mL/min (by C-G formula based on SCr of 1.02 mg/dL).  Assessment: 77 y.o. male with h/o Afib, Xarelto on hold and s/p ex-lap colectomy/colostomy. Pharmacy consulted to dose heparin infusion. Heparin level remained subtherapeutic on ~30 units/kg/hr so antithrombin III level checked and it was 40 (low) indicating heparin resistance so decision made to change to argatroban on 2/9 pm. aPTT 40 on admission. CBC stable.  aPTT is slightly subtherapeutic at 46 seconds. CBC stable. No issues with argatroban infusion overnight.  Goal of Therapy:  aPTT 50-90 seconds Monitor platelets by anticoagulation protocol: Yes   Plan:  Increase argatroban to 0.8 mcg/kg/min (~20% incr) Daily aPTT and CBC. F/u for resuming Xarelto and ASA 81 mg when cleared by Surgery.  Dimple Nanas, PharmD, BCPS 05/30/2022 7:13 AM

## 2022-05-30 NOTE — Progress Notes (Signed)
Physical Therapy Treatment Patient Details Name: [name] MRN: [id number] DOB: 04-23-1945 Today's Date: 05/30/2022   History of Present Illness 77 y.o. male admitted 2/6. s/p ex lap with Hartmann's procedure 2/8 for Complicated diverticulitis with coloatmospheric fistula. PMHx: PAF, CAD s/p stent, ICM with EF of 25%, PAD s/p aorto bifembypass, HTN, DM2, COPD    PT Comments    Patient progressing well towards functional goals which have been updated to maximize safety at a mod I level. Plans to d/c home with daughter for a while. Will benefit from HHPT. Ambulating >200 feet with supervision for safety. Still unsteady without assistive device. Feeling more confident. Patient will continue to benefit from skilled physical therapy services to further improve independence with functional mobility.    Recommendations for follow up therapy are one component of a multi-disciplinary discharge planning process, led by the attending physician.  Recommendations may be updated based on patient status, additional functional criteria and insurance authorization.  Follow Up Recommendations  Home health PT Can patient physically be transported by private vehicle: Yes   Assistance Recommended at Discharge Set up Supervision/Assistance  Patient can return home with the following A little help with walking and/or transfers;A little help with bathing/dressing/bathroom;Assistance with cooking/housework;Assist for transportation;Help with stairs or ramp for entrance   Equipment Recommendations  None recommended by PT    Recommendations for Other Services       Precautions / Restrictions Precautions Precautions: Fall Restrictions Weight Bearing Restrictions: No     Mobility  Bed Mobility Overal bed mobility: Needs Assistance Bed Mobility: Supine to Sit     Supine to sit: Supervision     General bed mobility comments: supervision for safety, extra time, assist with drains only.     Transfers Overall transfer level: Needs assistance Equipment used: Rolling walker (2 wheels) Transfers: Sit to/from Stand Sit to Stand: Min guard           General transfer comment: Min guard for safety, effortful rise but without physical assist from therapist, cues only for hand placement.    Ambulation/Gait Ambulation/Gait assistance: Supervision Gait Distance (Feet): 210 Feet Assistive device: Rolling walker (2 wheels) Gait Pattern/deviations: Step-through pattern, Decreased stride length Gait velocity: decr Gait velocity interpretation: <1.8 ft/sec, indicate of risk for recurrent falls   General Gait Details: Slower gait speed, good RW control, cues to leave room for error when approachign hazards. No overt buckling or LOB noted. Cues for proximity to walker for support. Pt reports feeling more confident today.   Stairs             Wheelchair Mobility    Modified Rankin (Stroke Patients Only)       Balance Overall balance assessment: Needs assistance Sitting-balance support: No upper extremity supported, Feet supported Sitting balance-Leahy Scale: Good     Standing balance support: Reliant on assistive device for balance Standing balance-Leahy Scale: Poor                              Cognition Arousal/Alertness: Awake/alert Behavior During Therapy: WFL for tasks assessed/performed Overall Cognitive Status: Within Functional Limits for tasks assessed                                          Exercises      General Comments        Pertinent  Vitals/Pain Pain Assessment Pain Assessment: 0-10 Pain Score: 4  Pain Location: LLQ Pain Descriptors / Indicators: Aching Pain Intervention(s): Monitored during session, Premedicated before session, Repositioned    Home Living                          Prior Function            PT Goals (current goals can now be found in the care plan section) Acute Rehab PT  Goals Patient Stated Goal: Get well PT Goal Formulation: With patient Time For Goal Achievement: 06/10/22 Potential to Achieve Goals: Good Progress towards PT goals: Progressing toward goals    Frequency    Min 3X/week      PT Plan Discharge plan needs to be updated    Co-evaluation              AM-PAC PT "6 Clicks" Mobility   Outcome Measure  Help needed turning from your back to your side while in a flat bed without using bedrails?: None Help needed moving from lying on your back to sitting on the side of a flat bed without using bedrails?: None Help needed moving to and from a bed to a chair (including a wheelchair)?: A Little Help needed standing up from a chair using your arms (e.g., wheelchair or bedside chair)?: A Little Help needed to walk in hospital room?: A Little Help needed climbing 3-5 steps with a railing? : A Little 6 Click Score: 20    End of Session Equipment Utilized During Treatment: Gait belt Activity Tolerance: Patient tolerated treatment well Patient left: in chair;with chair alarm set;with call bell/phone within reach Nurse Communication: Mobility status PT Visit Diagnosis: Unsteadiness on feet (R26.81);Other abnormalities of gait and mobility (R26.89);Muscle weakness (generalized) (M62.81);Difficulty in walking, not elsewhere classified (R26.2)     Time: [id number] PT Time Calculation (min) (ACUTE ONLY): 19 min  Charges:  $Gait Training: 8-22 mins                     Candie Mile, PT, DPT Physical Therapist Acute Rehabilitation Services [place] [facility]    [name] 05/30/2022, 10:23 AM

## 2022-05-30 NOTE — Consult Note (Addendum)
[place] Nurse ostomy follow up 05/26/2022 exp lap w/partial colectomy and colostomy creation  Stoma type/location: LLQ colostomy  Stomal assessment/size: 1 1/4", mildly edematous, slightly above skin level, slightly oval  Peristomal assessment: intact  Treatment options for stomal/peristomal skin:  Output 25 mls brown liquid  Ostomy pouching: 1pc convex Kellie Simmering [phone]) Education provided: Daughter Crystal present for education.  Educated patient and daughter on importance of emptying pouch when 1/3 to 1/2 full, cleaning spout with toilet paper wick.  Patient and daughter able to utilize lock and roll mechanism to open and close pouch.  Demonstrated to daughter how to size stoma, that stoma is still mildly edematous at this time and may change size over the next 2-4 weeks as edema subsides.  Demonstrated to daughter and patient how to cut 1 piece pouch to fit stoma, pattern left in room.  Daughter is very tearful, says she feels very overwhelmed because she travels for work and will not be at home to help her dad even if he comes to stay with her.  Daughter has lots of questions about drains and when they will be removed.  I did secure chat primary MD and surgical team regarding daughters questions.    Did demonstrate to daughter how to secure pouching system to stoma.  Daughter wants patient to learn how to perform ostomy change himself as she says she will not be there to help him.  WOC will plan to work with patient again prior to discharge with a mirror to see if patient can be more independent with care of ostomy.  Did give daughter educational packet which has steps outlined for one piece and two piece pouch change.  Daughter is interested in patient going for rehab to assist him at discharge.  TOC in room when this educational session was being completed.    Enrolled patient in [place] Start Discharge program: Yes   WOC will follow this patient for further ostomy education and support.     Thank you,    Yeiden Frenkel MSN, RN-BC, [employer]

## 2022-05-30 NOTE — Progress Notes (Signed)
Assessment & Plan: POD#4 - s/p ex lap with Hartmann's procedure for complicated diverticulitis with coloatmospheric fistula by Dr. Bobbye Morton 2/8 -IR drain to remain in place -surgical drain to remain in place -WOC following for new colostomy, fistula - advance to soft diet, start ensure TID instead of boost breeze -mobilize with PT/OT -cont IV abx therapy -prealbumin <5 - continue resource breeze   FEN - SOFT, resource breeze, IVFs per primary DVT - SCDs, argatroban gtt ID - zosyn    Patient states planning to go to his daughter's home in [place] on discharge.  Will need HHN to assist with wound care, drains, dressing changes.  Will enter consult request. Stable for discharge home tomorrow.        Obie Dredge, [facility] [place] practice Office: [phone]        Chief Complaint: Complex diverticulitis with fistula  Subjective: Up in the chair. Reports abdominal soreness. States he is tolerating 50-65% of his meal trays. Has been walking with PT  Objective: Vital signs in last 24 hours: Temp:  [97.6 F (36.4 C)-98.1 F (36.7 C)] 97.6 F (36.4 C) (02/12 0458) Pulse Rate:  [73-81] 81 (02/12 0835) Resp:  [18-20] 18 (02/12 0458) BP: (125-163)/(59-84) 163/75 (02/12 0835) SpO2:  [95 %-97 %] 96 % (02/12 0835)    Intake/Output from previous day: 02/11 0701 - 02/12 0700 In: 1901.3 [P.O.:500; I.V.:1251.3; IV Piggyback:150.1] Out: 1575 [Urine:1275; Drains:300] Intake/Output this shift: Total I/O In: 240 [P.O.:240] Out: 320 [Urine:275; Drains:45]  Physical Exam: HEENT - sclerae clear, mucous membranes moist Neck - soft Abdomen - dressing dry and intact; right JP drain with thin serosanguinous; left gravity drain with thin brown output; ostomy pouch LLQ with thin feculent output and gas  Lab Results:  Recent Labs    05/29/22 1135 05/30/22 0318  WBC 11.5* 9.8  HGB 11.6* 10.9*  HCT 36.4* 33.1*  PLT 484* 476*   BMET Recent Labs     05/29/22 1135 05/30/22 0318  NA 135 140  K 3.8 4.4  CL 105 106  CO2 23 25  GLUCOSE 198* 112*  BUN 11 10  CREATININE 1.10 1.02  CALCIUM 8.1* 8.4*   PT/INR No results for input(s): "LABPROT", "INR" in the last 72 hours. Comprehensive Metabolic Panel:    Component Value Date/Time   NA 140 05/30/2022 0318   NA 135 05/29/2022 1135   NA 138 03/28/2022 1038   NA 141 02/09/2018 0721   K 4.4 05/30/2022 0318   K 3.8 05/29/2022 1135   CL 106 05/30/2022 0318   CL 105 05/29/2022 1135   CO2 25 05/30/2022 0318   CO2 23 05/29/2022 1135   BUN 10 05/30/2022 0318   BUN 11 05/29/2022 1135   BUN 11 03/28/2022 1038   BUN 17 02/09/2018 0721   CREATININE 1.02 05/30/2022 0318   CREATININE 1.10 05/29/2022 1135   CREATININE 1.20 09/10/2013 0513   GLUCOSE 112 (H) 05/30/2022 0318   GLUCOSE 198 (H) 05/29/2022 1135   CALCIUM 8.4 (L) 05/30/2022 0318   CALCIUM 8.1 (L) 05/29/2022 1135   AST 14 (L) 05/30/2022 0318   AST 14 (L) 05/29/2022 1135   ALT 12 05/30/2022 0318   ALT 11 05/29/2022 1135   ALKPHOS 34 (L) 05/30/2022 0318   ALKPHOS 31 (L) 05/29/2022 1135   BILITOT 0.3 05/30/2022 0318   BILITOT 0.7 05/29/2022 1135   BILITOT 0.5 03/28/2022 1038   BILITOT 0.6 02/09/2018 0721   PROT 4.8 (L) 05/30/2022 [id number]  PROT 4.9 (L) 05/29/2022 1135   PROT 6.4 03/28/2022 1038   PROT 6.9 02/09/2018 0721   ALBUMIN 1.8 (L) 05/30/2022 0318   ALBUMIN 2.0 (L) 05/29/2022 1135   ALBUMIN 3.2 (L) 03/28/2022 1038   ALBUMIN 4.4 02/09/2018 0721    Studies/Results: No results found.    Darci Current Katie Moch 05/30/2022  Patient ID: [name], male   DOB: 1946-02-01, 77 y.o.   MRN: [id number]

## 2022-05-31 DIAGNOSIS — K5720 Diverticulitis of large intestine with perforation and abscess without bleeding: Secondary | ICD-10-CM | POA: Diagnosis not present

## 2022-05-31 LAB — CBC
HCT: 37.3 % — ABNORMAL LOW (ref 39.0–52.0)
Hemoglobin: 12.1 g/dL — ABNORMAL LOW (ref 13.0–17.0)
MCH: 28.3 pg (ref 26.0–34.0)
MCHC: 32.4 g/dL (ref 30.0–36.0)
MCV: 87.4 fL (ref 80.0–100.0)
Platelets: 540 10*3/uL — ABNORMAL HIGH (ref 150–400)
RBC: 4.27 MIL/uL (ref 4.22–5.81)
RDW: 18 % — ABNORMAL HIGH (ref 11.5–15.5)
WBC: 10.5 10*3/uL (ref 4.0–10.5)
nRBC: 0 % (ref 0.0–0.2)

## 2022-05-31 LAB — GLUCOSE, CAPILLARY
Glucose-Capillary: 119 mg/dL — ABNORMAL HIGH (ref 70–99)
Glucose-Capillary: 125 mg/dL — ABNORMAL HIGH (ref 70–99)
Glucose-Capillary: 148 mg/dL — ABNORMAL HIGH (ref 70–99)
Glucose-Capillary: 190 mg/dL — ABNORMAL HIGH (ref 70–99)
Glucose-Capillary: 197 mg/dL — ABNORMAL HIGH (ref 70–99)
Glucose-Capillary: 215 mg/dL — ABNORMAL HIGH (ref 70–99)

## 2022-05-31 MED ORDER — MORPHINE SULFATE (PF) 2 MG/ML IV SOLN
2.0000 mg | INTRAVENOUS | Status: DC | PRN
  Administered 2022-06-02 – 2022-06-03 (×2): 2 mg via INTRAVENOUS
  Filled 2022-05-31 (×2): qty 1

## 2022-05-31 MED ORDER — ASPIRIN 81 MG PO TBEC
81.0000 mg | DELAYED_RELEASE_TABLET | Freq: Every day | ORAL | Status: DC
  Administered 2022-05-31 – 2022-06-03 (×4): 81 mg via ORAL
  Filled 2022-05-31 (×4): qty 1

## 2022-05-31 NOTE — Progress Notes (Signed)
Physical Therapy Treatment Patient Details Name: [name] MRN: [id number] DOB: May 09, 1945 Today's Date: 05/31/2022   History of Present Illness 77 y.o. male admitted 2/6. s/p ex lap with Hartmann's procedure 2/8 for Complicated diverticulitis with coloatmospheric fistula. PMHx: PAF, CAD s/p stent, ICM with EF of 25%, PAD s/p aorto bifembypass, HTN, DM2, COPD    PT Comments    Continues to make great progress. Navigating 275 feet today with RW for support no evidence of LOB while using AD correctly. Needed cues on 2 occasions to keep RW on floor while turning. Safely navigates steps similar set-up to daughter's house. He states she will be available intermittently and has several friends that can check on him if he needs anything. Agreeable to HHPT. Pt mobilizing well and encouraged by progress. Patient will continue to benefit from skilled physical therapy services to further improve independence with functional mobility.   Recommendations for follow up therapy are one component of a multi-disciplinary discharge planning process, led by the attending physician.  Recommendations may be updated based on patient status, additional functional criteria and insurance authorization.  Follow Up Recommendations  Home health PT Can patient physically be transported by private vehicle: Yes   Assistance Recommended at Discharge PRN  Patient can return home with the following A little help with bathing/dressing/bathroom;Assistance with cooking/housework;Assist for transportation;Help with stairs or ramp for entrance   Equipment Recommendations  None recommended by PT    Recommendations for Other Services       Precautions / Restrictions Precautions Precautions: Fall Restrictions Weight Bearing Restrictions: No     Mobility  Bed Mobility Overal bed mobility: Needs Assistance Bed Mobility: Supine to Sit     Supine to sit: Supervision     General bed mobility comments: supervision for  safety, extra time, assist with drains only.    Transfers Overall transfer level: Needs assistance Equipment used: Rolling walker (2 wheels) Transfers: Sit to/from Stand Sit to Stand: Supervision           General transfer comment: Supervision for safety. Good boost to stand with single hand, holding urinal with second hand, stable upon standing with RW for light support.    Ambulation/Gait Ambulation/Gait assistance: Supervision Gait Distance (Feet): 275 Feet Assistive device: Rolling walker (2 wheels) Gait Pattern/deviations: Step-through pattern, Decreased stride length Gait velocity: decr Gait velocity interpretation: 1.31 - 2.62 ft/sec, indicative of limited community ambulator   General Gait Details: Impoving gait speed and symmetry. No evidence of buckling or erratic control, demonstrating safe ability to navigate around objects in hallway and room. Pt did need cues on 2 occasions to avoid lifting RW with sharp turns showing mild instability when doing so.   Stairs Stairs: Yes Stairs assistance: Supervision Stair Management: One rail Right, Alternating pattern, Step to pattern, Forwards Number of Stairs: 6 General stair comments: Alternating step pattern going up, step to pattern on descent. No evidence of buckling. Educated on safety and awareness. Set up similar to daughter's home. Pt feels confident with task.   Wheelchair Mobility    Modified Rankin (Stroke Patients Only)       Balance Overall balance assessment: Needs assistance Sitting-balance support: No upper extremity supported, Feet supported Sitting balance-Leahy Scale: Good     Standing balance support: No upper extremity supported, During functional activity Standing balance-Leahy Scale: [place]  Arousal/Alertness: Awake/alert Behavior During Therapy: WFL for tasks assessed/performed Overall Cognitive Status: Within Functional Limits for tasks  assessed                                          Exercises      General Comments        Pertinent Vitals/Pain Pain Assessment Pain Assessment: 0-10 Pain Score: 4  Pain Location: LLQ Pain Descriptors / Indicators: Aching Pain Intervention(s): Monitored during session, Repositioned    Home Living                          Prior Function            PT Goals (current goals can now be found in the care plan section) Acute Rehab PT Goals Patient Stated Goal: Get well PT Goal Formulation: With patient Time For Goal Achievement: 06/10/22 Potential to Achieve Goals: Good Progress towards PT goals: Progressing toward goals    Frequency    Min 3X/week      PT Plan Current plan remains appropriate    Co-evaluation              AM-PAC PT "6 Clicks" Mobility   Outcome Measure  Help needed turning from your back to your side while in a flat bed without using bedrails?: None Help needed moving from lying on your back to sitting on the side of a flat bed without using bedrails?: None Help needed moving to and from a bed to a chair (including a wheelchair)?: None Help needed standing up from a chair using your arms (e.g., wheelchair or bedside chair)?: A Little Help needed to walk in hospital room?: A Little Help needed climbing 3-5 steps with a railing? : A Little 6 Click Score: 21    End of Session Equipment Utilized During Treatment: Gait belt Activity Tolerance: Patient tolerated treatment well Patient left: in chair;with chair alarm set;with call bell/phone within reach Nurse Communication: Mobility status PT Visit Diagnosis: Unsteadiness on feet (R26.81);Other abnormalities of gait and mobility (R26.89);Muscle weakness (generalized) (M62.81);Difficulty in walking, not elsewhere classified (R26.2)     Time: [id number] PT Time Calculation (min) (ACUTE ONLY): 23 min  Charges:  $Gait Training: 8-22 mins $Therapeutic Activity: 8-22  mins                     Candie Mile, PT, DPT Physical Therapist Acute Rehabilitation Services [place]    [name] 05/31/2022, 10:01 AM

## 2022-05-31 NOTE — Progress Notes (Signed)
Progress Note   Patient: [name]:[id number] DOB: May 14, 1945 DOA: 05/24/2022     7 DOS: the patient was seen and examined on 05/31/2022   Brief hospital course: 77 y.o. male with medical history significant of PAF, CAD s/p stent, ICM with EF of 25% as of Nov 2023, PAD s/p aorto bifembypass in 1992 and fem-fem bypass (maybe with fem-pop) by Dr. Donnetta Hutching in 2011, HTN, DM2, COPD quit smoking in 2022.   Pt with diverticulitis with abscess, initially on Apr 08, 2022.  Had IR drain placed.  Seemed to be doing better for month of Jan, drainage had basically stopped.  But then sudden worsening of abd pain and increased drain output saw him admitted to [facility] on 2/1.  IR repositioned drain on 2/2 but had ongoing feculent output despite this and ABx.  Ultimately surgery offered on 2/6 but family wanted him transferred for the surgery to larger hospital.  Pt transferred to [facility]  Assessment and Plan: * Diverticulitis of large intestine with abscess -Failed medical treatment and IR drainage attempts x2 with ongoing feculant output around drain, 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 Surgery offered at [facility], however family had requested transfer to larger hospital, thus pt was transferred to [facility] -General Surgery following, pt is now s/p surgery 2/8 -Advancing diet per Surgery. -IR drain in place. Will need drain removed prior to d/c -completed 5 days of zosyn -Pt and family is in agreement for SNF. TOC following   Hypothyroidism Question of new diagnosis of hypothyroidism: -TSH 6.794 with free T4 of 1.31   AKI (acute kidney injury) ([place]) Creat at [facility] during admit: 1.6->1.0-> 1.4 Renal function improved with hydration -Recheck bmet in AM   SYSTOLIC HEART FAILURE, CHRONIC Cont BB Reportedly did require dose of lasix for concerns of volume overload on 2/3 at [facility] Entresto resumed Spironolactone continued   DM2 (diabetes mellitus, type 2) (HCC) Glucose in the mid-200's this afternoon Currently on  semglee to 10 units moderate SSI Q4H    Paroxysmal atrial fibrillation ([place]) Reportedly ran out of xarelto at home last month -Initially held [place] for surgery. Xarelto resumed 2/12  CAD with known RCA occlusion -Cardiology following -resumed ASA and xarelto per Cardiology recs. OK to give per General Surgery -cont BB -Continue on spironolactone -Entresto ras resumed per Cardiology -Cardiology recommended Farxiga before d/c  Subjective: Feeling much better today  Physical Exam: Vitals:   05/30/22 1600 05/30/22 2053 05/31/22 0423 05/31/22 0838  BP: (!) 148/76 (!) 143/80 (!) 152/77 (!) 147/66  Pulse: 70 74 75 79  Resp: '18 17 18 15  '$ Temp: 98 F (36.7 C) 98 F (36.7 C) 98 F (36.7 C) 97.9 F (36.6 C)  TempSrc: Oral Oral Oral Oral  SpO2: 96% 93% 94% 94%  Weight:      Height:       General exam: Awake, laying in bed, in nad Respiratory system: Normal respiratory effort, no wheezing Cardiovascular system: regular rate, s1, s2 Gastrointestinal system: Soft, nondistended, positive BS Central nervous system: CN2-12 grossly intact, strength intact Extremities: Perfused, no clubbing Skin: Normal skin turgor, no notable skin lesions seen Psychiatry: Mood normal // no visual hallucinations   Data Reviewed:  Labs reviewed: WBC 10.5, Hgb 12.1, Plts 540   Family Communication: Pt in room, family not at bedside  Disposition: Status is: Inpatient Remains inpatient appropriate because: Severity of illness  Planned Discharge Destination: Skilled nursing facility    Author: Marylu Lund, MD 05/31/2022 1:35 PM  For on  call review [email or web address].

## 2022-05-31 NOTE — Progress Notes (Signed)
Mobility Specialist Progress Note   05/31/22 1450  Mobility  Activity Ambulated with assistance in hallway  Level of Assistance Contact guard assist, steadying assist  Assistive Device Front wheel walker  Distance Ambulated (ft) 570 ft  Activity Response Tolerated well  Mobility Referral Yes  $Mobility charge 1 Mobility   Pt agreeable to mobility. Minimal pain to get EOB but no physical assist. Steady gait throughout ambulation. Returned back to room w/o fault, call bell in reach and bed alarm on.  [place] Mobility Specialist Please contact via SecureChat or  Rehab office at [phone]

## 2022-05-31 NOTE — TOC Progression Note (Addendum)
Transition of Care [facility]) - Initial/Assessment Note    Patient Details  Name: [name] MRN: [id number] Date of Birth: 1945-12-23  Transition of Care [facility]) CM/SW Contact:    Milinda Antis, LCSWA Phone Number: 05/31/2022, 10:32 AM  Clinical Narrative:                 LCSW contacted the patient's daughter to present bed offers as she is assisting with SNF search. There was no answer. LCSW left a VM requesting a returned call.   1500-  LCSW received a returned call from the patient's daughter.  LCSW presented bed offers.  The daughter choose [facility].  LCSW secured bed offer and requested that insurance auth be initiated.  TOC following.      Barriers to Discharge: Continued Medical Work up   Patient Goals and CMS Choice Patient states their goals for this hospitalization and ongoing recovery are:: To return home CMS Medicare.gov Compare Post Acute Care list provided to:: Patient Choice offered to / list presented to : Patient, Adult Children (Daughter, Teacher, music)      Expected Discharge Plan and Services   Discharge Planning Services: CM Consult   Living arrangements for the past 2 months: Single Family Home                                      Prior Living Arrangements/Services Living arrangements for the past 2 months: Single Family Home Lives with:: Self Patient language and need for interpreter reviewed:: Yes Do you feel safe going back to the place where you live?: Yes      Need for Family Participation in Patient Care: Yes (Comment) Care giver support system in place?: Yes (comment)   Criminal Activity/Legal Involvement Pertinent to Current Situation/Hospitalization: No - Comment as needed  Activities of Daily Living Home Assistive Devices/Equipment: Dentures (specify type) ADL Screening (condition at time of admission) Patient's cognitive ability adequate to safely complete daily activities?: Yes Is the patient deaf or have difficulty hearing?:  Yes Does the patient have difficulty seeing, even when wearing glasses/contacts?: No Does the patient have difficulty concentrating, remembering, or making decisions?: No Patient able to express need for assistance with ADLs?: Yes Does the patient have difficulty dressing or bathing?: No Independently performs ADLs?: Yes (appropriate for developmental age) Does the patient have difficulty walking or climbing stairs?: Yes Weakness of Legs: Both Weakness of Arms/Hands: None  Permission Sought/Granted Permission sought to share information with : Case Manager, Family Supports Permission granted to share information with : Yes, Verbal Permission Granted              Emotional Assessment Appearance:: Appears stated age Attitude/Demeanor/Rapport: Engaged, Gracious Affect (typically observed): Accepting, Appropriate, Calm, Hopeful, Pleasant Orientation: : Oriented to Self, Oriented to Place, Oriented to  Time, Oriented to Situation Alcohol / Substance Use: Not Applicable Psych Involvement: No (comment)  Admission diagnosis:  Diverticulitis of large intestine with abscess [K57.20] Patient Active Problem List   Diagnosis Date Noted   Protein-calorie malnutrition, severe 05/29/2022   Diverticulitis of large intestine with abscess 05/24/2022   AKI (acute kidney injury) ([place]) 05/24/2022   Hypothyroidism 05/24/2022   Need for prophylactic vaccination and inoculation against influenza 03/28/2022   Lethargy 03/28/2022   Current moderate episode of major depressive disorder without prior episode ([place]) 03/28/2022   Bladder cancer ([place]) [id number]   Chronic systolic (congestive) heart failure ([place]) 02/09/2022  Cigarette smoker 02/09/2022   Coronary artery disease 02/09/2022   Ischemic cardiomyopathy 02/09/2022   Lumbar spinal stenosis 02/09/2022   Myocardial infarction ([place]) 02/09/2022   NSVT (nonsustained ventricular tachycardia) ([place]) 02/09/2022   PAF (paroxysmal atrial fibrillation)  ([place]) 02/09/2022   PVC's (premature ventricular contractions) 02/09/2022   DM2 (diabetes mellitus, type 2) ([place]) 02/09/2022   Diverticulitis large intestine 05/11/2021   Status post coronary artery stent placement    Unstable angina ([place]) 10/16/2017   Coronary artery disease involving native coronary artery of native heart with unstable angina pectoris ([place])    Acute low back pain 01/12/2015   Atherosclerosis of native arteries of the extremities with intermittent claudication 08/13/2013   Backache 02/08/2013   TOBACCO ABUSE 05/29/2009   HYPERLIPIDEMIA-MIXED 09/22/2008   HYPERTENSION, BENIGN 09/22/2008   CAD, NATIVE VESSEL 0000000   SYSTOLIC HEART FAILURE, CHRONIC 09/22/2008   PVD 09/22/2008   PCP:  Garwin Brothers, MD Pharmacy:   [facility], [address] AT [place] [id number] [place] [address] Phone: [phone] Fax: [phone]     Social Determinants of Health (SDOH) Social History: SDOH Screenings   Food Insecurity: No Food Insecurity (05/24/2022)  Housing: Low Risk  (05/24/2022)  Transportation Needs: No Transportation Needs (05/24/2022)  Utilities: Not At Risk (05/24/2022)  Depression (PHQ2-9): High Risk (03/28/2022)  Tobacco Use: Medium Risk (05/27/2022)   SDOH Interventions: Transportation Interventions: Intervention Not Indicated, Inpatient TOC, Patient Resources (Friends/Family)   Readmission Risk Interventions     No data to display

## 2022-05-31 NOTE — Progress Notes (Signed)
Progress Note  5 Days Post-Op  Subjective: Tolerating diet and having bowel function. Patient planning on DC to SNF prior to going home now. Denies n/v. Likes chocolate ensure.   Objective: Vital signs in last 24 hours: Temp:  [97.9 F (36.6 C)-98 F (36.7 C)] 97.9 F (36.6 C) (02/13 0838) Pulse Rate:  [70-79] 79 (02/13 0838) Resp:  [15-18] 15 (02/13 0838) BP: (143-152)/(66-80) 147/66 (02/13 0838) SpO2:  [93 %-96 %] 94 % (02/13 0838)    Intake/Output from previous day: 02/12 0701 - 02/13 0700 In: 240 [P.O.:240] Out: 2105 [Urine:1895; Drains:185; Stool:25] Intake/Output this shift: Total I/O In: 240 [P.O.:240] Out: 20 [Drains:20]  PE: General: pleasant, WD, thin male who is laying in bed in NAD Lungs:  Respiratory effort nonlabored Abd: midline wound with staples present, penrose present with bloody drainage superiorly and inferiorly, no cellulitis; right JP drain with thin serosanguinous; left gravity drain with thin brown feculent output; ostomy pouch LLQ with thin feculent output; minimal TTP as expected   Lab Results:  Recent Labs    05/30/22 0318 05/31/22 0235  WBC 9.8 10.5  HGB 10.9* 12.1*  HCT 33.1* 37.3*  PLT 476* 540*   BMET Recent Labs    05/29/22 1135 05/30/22 0318  NA 135 140  K 3.8 4.4  CL 105 106  CO2 23 25  GLUCOSE 198* 112*  BUN 11 10  CREATININE 1.10 1.02  CALCIUM 8.1* 8.4*   PT/INR No results for input(s): "LABPROT", "INR" in the last 72 hours. CMP     Component Value Date/Time   NA 140 05/30/2022 0318   NA 138 03/28/2022 1038   K 4.4 05/30/2022 0318   CL 106 05/30/2022 0318   CO2 25 05/30/2022 0318   GLUCOSE 112 (H) 05/30/2022 0318   BUN 10 05/30/2022 0318   BUN 11 03/28/2022 1038   CREATININE 1.02 05/30/2022 0318   CREATININE 1.20 09/10/2013 0513   CALCIUM 8.4 (L) 05/30/2022 0318   PROT 4.8 (L) 05/30/2022 0318   PROT 6.4 03/28/2022 1038   ALBUMIN 1.8 (L) 05/30/2022 0318   ALBUMIN 3.2 (L) 03/28/2022 1038   AST 14 (L)  05/30/2022 0318   ALT 12 05/30/2022 0318   ALKPHOS 34 (L) 05/30/2022 0318   BILITOT 0.3 05/30/2022 0318   BILITOT 0.5 03/28/2022 1038   GFRNONAA >60 05/30/2022 0318   GFRAA 82 02/09/2018 0721   Lipase  No results found for: "LIPASE"     Studies/Results: No results found.  Anti-infectives: Anti-infectives (From admission, onward)    Start     Dose/Rate Route Frequency Ordered Stop   05/26/22 1600  piperacillin-tazobactam (ZOSYN) IVPB 3.375 g        3.375 g 12.5 mL/hr over 240 Minutes Intravenous Every 8 hours 05/26/22 1429 05/30/22 2056   05/25/22 0000  piperacillin-tazobactam (ZOSYN) IVPB 3.375 g  Status:  Discontinued        3.375 g 12.5 mL/hr over 240 Minutes Intravenous Every 8 hours 05/24/22 2126 05/26/22 1429        Assessment/Plan  POD#5 - s/p ex lap with Hartmann's procedure for complicated diverticulitis with coloatmospheric fistula by Dr. Bobbye Morton 2/8 - IR drain to remain in place, feculent appearing  - surgical drain to remain in place, SS fluid  - WOC following for new colostomy, fistula - continue soft diet and ensure TID  - mobilize with PT/OT - now planning SNF on DC - completed IV abx, no leukocytosis and afebrile - will discuss with MD if PO  abx needed  - prealbumin <5 - continue resource breeze - will need penrose removed from midline incision prior to DC - stable for DC from surgery standpoint    FEN - SOFT, ensure  DVT - SCDs, xarelto, ok to resume ASA also if needed  ID - completed 5 days Zosyn    - per TRH-  CAD s/p stenting, known RCA occlusion - xarelto resumed  Chronic systolic CHF - EF [id number] - SSI PAF - xarelto resumed yesterday AKI Hypothyroidism   LOS: 7 days     Norm Parcel, [facility] Surgery 05/31/2022, 10:08 AM Please see Amion for pager number during day hours 7:00am-4:30pm

## 2022-06-01 DIAGNOSIS — K5720 Diverticulitis of large intestine with perforation and abscess without bleeding: Secondary | ICD-10-CM | POA: Diagnosis not present

## 2022-06-01 LAB — GLUCOSE, CAPILLARY
Glucose-Capillary: 122 mg/dL — ABNORMAL HIGH (ref 70–99)
Glucose-Capillary: 195 mg/dL — ABNORMAL HIGH (ref 70–99)
Glucose-Capillary: 148 mg/dL — ABNORMAL HIGH (ref 70–99)
Glucose-Capillary: 225 mg/dL — ABNORMAL HIGH (ref 70–99)
Glucose-Capillary: 188 mg/dL — ABNORMAL HIGH (ref 70–99)

## 2022-06-01 LAB — COMPREHENSIVE METABOLIC PANEL
ALT: 11 U/L (ref 0–44)
AST: 12 U/L — ABNORMAL LOW (ref 15–41)
Albumin: 1.9 g/dL — ABNORMAL LOW (ref 3.5–5.0)
Alkaline Phosphatase: 46 U/L (ref 38–126)
Anion gap: 6 (ref 5–15)
BUN: 10 mg/dL (ref 8–23)
CO2: 28 mmol/L (ref 22–32)
Calcium: 8.5 mg/dL — ABNORMAL LOW (ref 8.9–10.3)
Chloride: 101 mmol/L (ref 98–111)
Creatinine, Ser: 1.17 mg/dL (ref 0.61–1.24)
GFR, Estimated: >60 mL/min (ref >60)
Glucose, Bld: 133 mg/dL — ABNORMAL HIGH (ref 70–99)
Potassium: 4.2 mmol/L (ref 3.5–5.1)
Sodium: 135 mmol/L (ref 135–145)
Total Bilirubin: <0.1 mg/dL — ABNORMAL LOW (ref 0.3–1.2)
Total Protein: 4.8 g/dL — ABNORMAL LOW (ref 6.5–8.1)

## 2022-06-01 LAB — CBC
HCT: 36.9 % — ABNORMAL LOW (ref 39.0–52.0)
Hemoglobin: 11.9 g/dL — ABNORMAL LOW (ref 13.0–17.0)
MCH: 28.5 pg (ref 26.0–34.0)
MCHC: 32.2 g/dL (ref 30.0–36.0)
MCV: 88.3 fL (ref 80.0–100.0)
Platelets: 474 10*3/uL — ABNORMAL HIGH (ref 150–400)
RBC: 4.18 MIL/uL — ABNORMAL LOW (ref 4.22–5.81)
RDW: 18.4 % — ABNORMAL HIGH (ref 11.5–15.5)
WBC: 10.4 10*3/uL (ref 4.0–10.5)
nRBC: 0 % (ref 0.0–0.2)

## 2022-06-01 LAB — MAGNESIUM: Magnesium: 1.9 mg/dL (ref 1.7–2.4)

## 2022-06-01 MED ORDER — SIMETHICONE 80 MG PO CHEW: 80.0000 mg | CHEWABLE_TABLET | Freq: Four times a day (QID) | ORAL | Status: DC | PRN

## 2022-06-01 MED ORDER — INSULIN ASPART 100 UNIT/ML IJ SOLN
0.0000 [IU] | Freq: Three times a day (TID) | INTRAMUSCULAR | Status: DC
  Administered 2022-06-01 (×2): 2 [IU] via SUBCUTANEOUS
  Administered 2022-06-01: 1 [IU] via SUBCUTANEOUS
  Administered 2022-06-02 (×3): 2 [IU] via SUBCUTANEOUS
  Administered 2022-06-02: 3 [IU] via SUBCUTANEOUS
  Administered 2022-06-03: 2 [IU] via SUBCUTANEOUS
  Administered 2022-06-03: 1 [IU] via SUBCUTANEOUS

## 2022-06-01 MED ORDER — PANTOPRAZOLE SODIUM 40 MG PO TBEC
40.0000 mg | DELAYED_RELEASE_TABLET | Freq: Every day | ORAL | Status: DC
  Administered 2022-06-01 – 2022-06-03 (×3): 40 mg via ORAL
  Filled 2022-06-01 (×3): qty 1

## 2022-06-01 NOTE — Progress Notes (Signed)
Mobility Specialist Progress Note   06/01/22 1205  Mobility  Activity Ambulated with assistance in room;Ambulated with assistance to bathroom;Transferred from bed to chair  Level of Assistance Contact guard assist, steadying assist  Assistive Device None  Distance Ambulated (ft) 30 ft  Range of Motion/Exercises Active;All extremities  Activity Response Tolerated well   Patient received in supine and agreeable to participate. Deferred hallway ambulation for unspecified reasons but requested assistance to bathroom and for lunch setup in recliner. Ambulated in room min guard to supervision with steady gait. Was left in recliner with all needs met, call bell in reach.   [name], BS EXP Mobility Specialist Please contact via SecureChat or Rehab office at [phone]

## 2022-06-01 NOTE — TOC Progression Note (Signed)
Transition of Care [facility]) - Progression Note    Patient Details  Name: [name] MRN: [id number] Date of Birth: 1945-05-05  Transition [facility]) CM/SW Contact  Tom-Johnson, Renea Ee, RN Phone Number: 06/01/2022, 4:30 PM  Clinical Narrative:     CM spoke with patient and daughter about Home Health, both has no preference. CM called in referral to [facility], Kelly voiced acceptance and info on AVS.  CM will continue to follow as patient progresses with care towards discharge.    Barriers to Discharge: Continued Medical Work up  Expected Discharge Plan and Services   Discharge Planning Services: CM Consult   Living arrangements for the past 2 months: Single Family Home                                       Social Determinants of Health (SDOH) Interventions SDOH Screenings   Food Insecurity: No Food Insecurity (05/24/2022)  Housing: Low Risk  (05/24/2022)  Transportation Needs: No Transportation Needs (05/24/2022)  Utilities: Not At Risk (05/24/2022)  Depression (PHQ2-9): High Risk (03/28/2022)  Tobacco Use: Medium Risk (05/27/2022)    Readmission Risk Interventions     No data to display

## 2022-06-01 NOTE — Progress Notes (Signed)
Progress Note  6 Days Post-Op  Subjective: Reports poor sleep due to frequent disruptions. Pain controlled. Tolerating diet and having bowel function. Patient planning on DC to SNF prior to going home now.   Objective: Vital signs in last 24 hours: Temp:  [97.6 F (36.4 C)-98 F (36.7 C)] 97.9 F (36.6 C) (02/14 0557) Pulse Rate:  [71-80] 80 (02/14 0557) Resp:  [16-18] 18 (02/14 0557) BP: (105-143)/(54-79) 143/79 (02/14 0557) SpO2:  [92 %-95 %] 94 % (02/14 0557)    Intake/Output from previous day: 02/13 0701 - 02/14 0700 In: 730 [P.O.:720; I.V.:10] Out: 1455 [Urine:820; Drains:535; Stool:100] Intake/Output this shift: No intake/output data recorded.  PE: General: pleasant, WD, thin male who is laying in bed in NAD Lungs:  Respiratory effort nonlabored Abd: midline wound with staples present, penrose present with SS drainage superiorly and inferiorly, no cellulitis; right JP drain with thin serosanguinous; left gravity drain with thin brown feculent output; ostomy pouch LLQ with thin feculent output; minimal TTP as expected   Lab Results:  Recent Labs    05/31/22 0235 06/01/22 0052  WBC 10.5 10.4  HGB 12.1* 11.9*  HCT 37.3* 36.9*  PLT 540* 474*   BMET Recent Labs    05/30/22 0318 06/01/22 0052  NA 140 135  K 4.4 4.2  CL 106 101  CO2 25 28  GLUCOSE 112* 133*  BUN 10 10  CREATININE 1.02 1.17  CALCIUM 8.4* 8.5*   PT/INR No results for input(s): "LABPROT", "INR" in the last 72 hours. CMP     Component Value Date/Time   NA 135 06/01/2022 0052   NA 138 03/28/2022 1038   K 4.2 06/01/2022 0052   CL 101 06/01/2022 0052   CO2 28 06/01/2022 0052   GLUCOSE 133 (H) 06/01/2022 0052   BUN 10 06/01/2022 0052   BUN 11 03/28/2022 1038   CREATININE 1.17 06/01/2022 0052   CREATININE 1.20 09/10/2013 0513   CALCIUM 8.5 (L) 06/01/2022 0052   PROT 4.8 (L) 06/01/2022 0052   PROT 6.4 03/28/2022 1038   ALBUMIN 1.9 (L) 06/01/2022 0052   ALBUMIN 3.2 (L) 03/28/2022 1038    AST 12 (L) 06/01/2022 0052   ALT 11 06/01/2022 0052   ALKPHOS 46 06/01/2022 0052   BILITOT <0.1 (L) 06/01/2022 0052   BILITOT 0.5 03/28/2022 1038   GFRNONAA >60 06/01/2022 0052   GFRAA 82 02/09/2018 0721   Lipase  No results found for: "LIPASE"     Studies/Results: No results found.  Anti-infectives: Anti-infectives (From admission, onward)    Start     Dose/Rate Route Frequency Ordered Stop   05/26/22 1600  piperacillin-tazobactam (ZOSYN) IVPB 3.375 g        3.375 g 12.5 mL/hr over 240 Minutes Intravenous Every 8 hours 05/26/22 1429 05/30/22 2056   05/25/22 0000  piperacillin-tazobactam (ZOSYN) IVPB 3.375 g  Status:  Discontinued        3.375 g 12.5 mL/hr over 240 Minutes Intravenous Every 8 hours 05/24/22 2126 05/26/22 1429        Assessment/Plan  POD#6 - s/p ex lap with Hartmann's procedure for complicated diverticulitis with coloatmospheric fistula by Dr. Bobbye Morton 2/8 - IR drain to remain in place, feculent appearing  - surgical drain SS fluid - plan to d/c prior to discharge - WOC following for new colostomy, fistula - continue soft diet and ensure TID  - mobilize with PT/OT - now planning SNF on DC - completed IV abx, no leukocytosis and afebrile  - prealbumin <5 -  continue ensure - will need penrose removed from midline incision prior to DC - stable for DC from surgery standpoint, awaiting SNF, insurance auth inititiated 2/13   FEN - SOFT, ensure  DVT - SCDs, xarelto, ok to resume ASA also if needed; hgb stable ID - completed 5 days Zosyn    - per TRH-  CAD s/p stenting, known RCA occlusion - xarelto resumed  Chronic systolic CHF - EF [id number] - SSI PAF - xarelto 2/12 AKI Hypothyroidism   LOS: 8 days     Jill Alexanders, [facility] Surgery 06/01/2022, 8:59 AM Please see Amion for pager number during day hours 7:00am-4:30pm

## 2022-06-01 NOTE — Consult Note (Signed)
[facility] Nurse ostomy follow up 05/26/2022 exp lap w/partial colectomy and colostomy  Stoma type/location: LLQ colostomy  Stomal assessment/size: edema subsiding, oval 1" x 7/8", slightly above skin level Peristomal assessment:  mild erythema circumferential Treatment options for stomal/peristomal skin: crusted skin with skin barrier film and antifungal powder; ordered ostomy powder for room  Output 50 mls soft brown stool  Ostomy pouching: 1pc. Convex Kellie Simmering [phone])  Education provided: Patient alone in room at this session.  Daughter wants patient  to learn to manage his own ostomy as she travels for work. Patient very attentive and engaged.  We reviewed emptying the pouch, patient is able to utilize the lock and roll mechanism to open pouch, use toilet paper wick to clean spout and then close pouch.  This RN brought patient a hand held mirror for this visit so he could see stoma better.  We discussed how the edema is subsiding and that stoma looks different now than it did 2 days ago.  We discussed measuring the stoma and demonstrated how to measure using measuring device as stoma size may continue to change over next 2-4 weeks.  Demonstrated to patient how to cut skin barrier.  We discussed using pattern once stoma has stabilized to precut pouches and that he could have pouches ready (skin barrier cut and lock and roll mechanism closed) prior to removing old pouch so that when he did remove the old pouch he could just clean skin and apply new pouch.  Patient verbalized understanding of cleaning around stoma only with water moistened washcloth and no use of lotions or regular powders like baby powders as these would hinder skin barrier from adhering to skin.  Did demonstrate to patient how to adhere skin barrier to skin using his hand to meld barrier around stoma.  Overall patient feels like he will be able to manage ostomy independently once he no longer has an abdominal dressing and drains to work around.   These items are indeed a hindrance  to him being able to see the ostomy at this time.  The mirror today did help him to visualize the stoma and watch nurse apply skin barrier to ostomy.    Enrolled patient in [facility] Discharge program: Yes  Plan now is for patient to go to SNF for rehab, awaiting insurance authorization.  I did order 1 piece Convex pouches Kellie Simmering [phone]) and ostomy powder Kellie Simmering #6) at today's session.    WOC will continue to follow this patient for further ostomy education and support while inpatient.    Thank you,    Aowyn Rozeboom MSN, RN-BC, [employer]

## 2022-06-01 NOTE — Progress Notes (Signed)
Referring Physician(s): Moorhead,Andrew S.  Supervising Physician: Arne Cleveland  Patient Status:  [facility] - In-pt  Chief Complaint:  Diverticular abscess s/p drain placement 05/12/22 at [place] and upsized 05/23/22  Subjective:  Patient alert, lying in bed watching TV at time of exam. He reports no concerns regarding his LLQ abdominal drain at this time, says that he feels "blessed to have such a good team taking care of him."   Allergies: Lisinopril  Medications: Prior to Admission medications   Medication Sig Start Date End Date Taking? Authorizing Provider  amLODipine (NORVASC) 5 MG tablet Take 1 tablet (5 mg total) by mouth daily. 07/08/21  Yes Burnell Blanks, MD  aspirin EC 81 MG tablet Take 1 tablet (81 mg total) by mouth daily. Swallow whole. 05/10/21  Yes Burnell Blanks, MD  budesonide-formoterol [facility]) 160-4.5 MCG/ACT inhaler Inhale 2 puffs into the lungs 2 (two) times daily as needed for wheezing or shortness of breath.   Yes [provider]  carvedilol (COREG) 12.5 MG tablet Take 12.5 mg by mouth 2 (two) times daily with a meal.   Yes [provider]  cetirizine (ZYRTEC) 10 MG tablet Take 10 mg by mouth daily. 02/01/22  Yes [provider]  glipiZIDE (GLUCOTROL) 10 MG tablet Take 1 tablet (10 mg total) by mouth 2 (two) times daily. 03/28/22  Yes Garwin Brothers, MD  rivaroxaban (XARELTO) 20 MG TABS tablet Take 20 mg by mouth daily with supper.   Yes [provider]  rosuvastatin (CRESTOR) 20 MG tablet Take 1 tablet (20 mg total) by mouth daily. 07/08/21  Yes Burnell Blanks, MD  sacubitril-valsartan (ENTRESTO) 24-26 MG Take 1 tablet by mouth 2 (two) times daily.   Yes [provider]  sertraline (ZOLOFT) 50 MG tablet Take 1 tablet (50 mg total) by mouth daily. Patient not taking: Reported on 05/24/2022 03/28/22 03/28/23  Garwin Brothers, MD  XARELTO 20 MG TABS tablet TAKE 1 TABLET BY MOUTH DAILY WITH  SUPPER Patient not taking: Reported on 05/24/2022 07/26/21   Burnell Blanks, MD     Vital Signs: BP (!) 148/68   Pulse 87   Temp 98 F (36.7 C) (Oral)   Resp 14   Ht 5' 7"$  (1.702 m)   Wt 155 lb 3.3 oz (70.4 kg)   SpO2 95%   BMI 24.31 kg/m   Physical Exam Vitals reviewed.  Constitutional:      General: He is not in acute distress. Pulmonary:     Effort: Pulmonary effort is normal.  Abdominal:     Comments: Colostomy bag in place, surgical drain in RLQ, IR drain in LLQ.  LLQ drain dressed appropriately, dressing was clean/dry/intact. Less than 50 mL of feculent fluid present in gravity bag at time of exam. Patient non-tender at site, suture in place.  Neurological:     Mental Status: He is alert and oriented to person, place, and time.  Psychiatric:        Mood and Affect: Mood normal.        Behavior: Behavior normal.      Interval imaging/drain manipulation:  CT Abdomen Pelvis WO Contrast 05/25/22 -Concern for fistulous connection from abscess to proximal sigmoid colon  Current examination: Insertion site unremarkable. Suture and stat lock in place. Dressed appropriately.    Imaging: No results found.  Labs:  CBC: Recent Labs    05/29/22 1135 05/30/22 0318 05/31/22 0235 06/01/22 0052  WBC 11.5* 9.8 10.5 10.4  HGB 11.6* 10.9* 12.1*  11.9*  HCT 36.4* 33.1* 37.3* 36.9*  PLT 484* 476* 540* 474*    COAGS: Recent Labs    05/28/22 1146 05/28/22 1614 05/29/22 1135 05/30/22 0318  APTT 88* 92* 71* 46*    BMP: Recent Labs    05/28/22 0410 05/29/22 1135 05/30/22 0318 06/01/22 0052  NA 134* 135 140 135  K 3.4* 3.8 4.4 4.2  CL 102 105 106 101  CO2 23 23 25 28  $ GLUCOSE 232* 198* 112* 133*  BUN 13 11 10 10  $ CALCIUM 8.2* 8.1* 8.4* 8.5*  CREATININE 1.12 1.10 1.02 1.17  GFRNONAA >60 >60 >60 >60    LIVER FUNCTION TESTS: Recent Labs    05/28/22 0410 05/29/22 1135 05/30/22 0318 06/01/22 0052  BILITOT 0.8 0.7 0.3 <0.1*  AST 16 14* 14* 12*   ALT 10 11 12 11  $ ALKPHOS 29* 31* 34* 46  PROT 5.3* 4.9* 4.8* 4.8*  ALBUMIN 2.2* 2.0* 1.8* 1.9*    Assessment and Plan:  Diverticular abscess s/p drain placement 05/12/22 with drain upsize 05/23/22 -drain seems to be functioning without issue -Outpatient follow-up arranged for patient once he is discharged. IR schedulers to contact patient with details of appointment.   Plan: Discontinue saline flushes d/t fistulous connection Record output Q shift. Dressing changes QD or PRN if soiled.  Call IR APP or on call IR MD if difficulty flushing or sudden change in drain output.  Repeat imaging/possible drain injection once output < 10 mL/QD (excluding flush material). Consideration for drain removal if output is < 10 mL/QD (excluding flush material), pending discussion with the providing surgical service.  Discharge planning: Patient's outpatient follow-up orders for IR drain clinic have been placed.   Electronically Signed: Lura Em, PA-C 06/01/2022, 4:31 PM   I spent a total of 15 Minutes at the the patient's bedside AND on the patient's hospital floor or unit, greater than 50% of which was counseling/coordinating care for diverticular abscess s/p drain placement.

## 2022-06-01 NOTE — TOC Progression Note (Signed)
Transition of Care [facility]) - Initial/Assessment Note    Patient Details  Name: [name] MRN: [id number] Date of Birth: 25-Nov-1945  Transition of Care [facility]) CM/SW Contact:    Milinda Antis, [place] Phone Number: 06/01/2022, 4:25 PM  Clinical Narrative:                 LCSW informed attending MD that the insurance company is requesting a peer to peer.  Peer to peer completed and insurance authorization was denied.  LCSW contacted the facility and informed them of the above information.  LCSW also contacted the patient's daughter and left a voicemail.  RNCM informed.  TOC following.      Barriers to Discharge: Continued Medical Work up   Patient Goals and CMS Choice Patient states their goals for this hospitalization and ongoing recovery are:: To return home CMS Medicare.gov Compare Post Acute Care list provided to:: Patient Choice offered to / list presented to : Patient, Adult Children (Daughter, Teacher, music)      Expected Discharge Plan and Services   Discharge Planning Services: CM Consult   Living arrangements for the past 2 months: Single Family Home                                      Prior Living Arrangements/Services Living arrangements for the past 2 months: Single Family Home Lives with:: Self Patient language and need for interpreter reviewed:: Yes Do you feel safe going back to the place where you live?: Yes      Need for Family Participation in Patient Care: Yes (Comment) Care giver support system in place?: Yes (comment)   Criminal Activity/Legal Involvement Pertinent to Current Situation/Hospitalization: No - Comment as needed  Activities of Daily Living Home Assistive Devices/Equipment: Dentures (specify type) ADL Screening (condition at time of admission) Patient's cognitive ability adequate to safely complete daily activities?: Yes Is the patient deaf or have difficulty hearing?: Yes Does the patient have difficulty seeing, even when  wearing glasses/contacts?: No Does the patient have difficulty concentrating, remembering, or making decisions?: No Patient able to express need for assistance with ADLs?: Yes Does the patient have difficulty dressing or bathing?: No Independently performs ADLs?: Yes (appropriate for developmental age) Does the patient have difficulty walking or climbing stairs?: Yes Weakness of Legs: Both Weakness of Arms/Hands: None  Permission Sought/Granted Permission sought to share information with : Case Manager, Family Supports Permission granted to share information with : Yes, Verbal Permission Granted              Emotional Assessment Appearance:: Appears stated age Attitude/Demeanor/Rapport: Engaged, Gracious Affect (typically observed): Accepting, Appropriate, Calm, Hopeful, Pleasant Orientation: : Oriented to Self, Oriented to Place, Oriented to  Time, Oriented to Situation Alcohol / Substance Use: Not Applicable Psych Involvement: No (comment)  Admission diagnosis:  Diverticulitis of large intestine with abscess [K57.20] Patient Active Problem List   Diagnosis Date Noted   Protein-calorie malnutrition, severe 05/29/2022   Diverticulitis of large intestine with abscess 05/24/2022   AKI (acute kidney injury) ([place]) 05/24/2022   Hypothyroidism 05/24/2022   Need for prophylactic vaccination and inoculation against influenza 03/28/2022   Lethargy 03/28/2022   Current moderate episode of major depressive disorder without prior episode ([place]) 03/28/2022   Bladder cancer ([place]) [id number]   Chronic systolic (congestive) heart failure ([place]) 02/09/2022   Cigarette smoker 02/09/2022   Coronary artery disease 02/09/2022   Ischemic cardiomyopathy  02/09/2022   Lumbar spinal stenosis 02/09/2022   Myocardial infarction ([place]) 02/09/2022   NSVT (nonsustained ventricular tachycardia) ([place]) 02/09/2022   PAF (paroxysmal atrial fibrillation) ([place]) 02/09/2022   PVC's (premature ventricular  contractions) 02/09/2022   DM2 (diabetes mellitus, type 2) ([place]) 02/09/2022   Diverticulitis large intestine 05/11/2021   Status post coronary artery stent placement    Unstable angina ([place]) 10/16/2017   Coronary artery disease involving native coronary artery of native heart with unstable angina pectoris ([place])    Acute low back pain 01/12/2015   Atherosclerosis of native arteries of the extremities with intermittent claudication 08/13/2013   Backache 02/08/2013   TOBACCO ABUSE 05/29/2009   HYPERLIPIDEMIA-MIXED 09/22/2008   HYPERTENSION, BENIGN 09/22/2008   CAD, NATIVE VESSEL 0000000   SYSTOLIC HEART FAILURE, CHRONIC 09/22/2008   PVD 09/22/2008   PCP:  Garwin Brothers, MD Pharmacy:   [facility], [address] AT [id number] [place] [address] Phone: [phone] Fax: [phone]     Social Determinants of Health (SDOH) Social History: SDOH Screenings   Food Insecurity: No Food Insecurity (05/24/2022)  Housing: Low Risk  (05/24/2022)  Transportation Needs: No Transportation Needs (05/24/2022)  Utilities: Not At Risk (05/24/2022)  Depression (PHQ2-9): High Risk (03/28/2022)  Tobacco Use: Medium Risk (05/27/2022)   SDOH Interventions: Transportation Interventions: Intervention Not Indicated, Inpatient TOC, Patient Resources (Friends/Family)   Readmission Risk Interventions     No data to display

## 2022-06-01 NOTE — Progress Notes (Addendum)
Per Dr. Victorino December request, post hospital f/u arranged 3/5, appt info placed on AVS (I also outlined that the appointment was scheduled in [facility] since Dr. Agustin Cree did not have any appointments available in the timeframe needed for follow-up in [place]). Please call with questions.

## 2022-06-01 NOTE — Plan of Care (Signed)
  Problem: Nutrition: Goal: Adequate nutrition will be maintained Outcome: Progressing   Problem: Elimination: Goal: Will not experience complications related to urinary retention Outcome: Progressing   Problem: Pain Managment: Goal: General experience of comfort will improve Outcome: Progressing   

## 2022-06-01 NOTE — Progress Notes (Addendum)
Progress Note   Patient: [name] [id number] DOB: Apr 25, 1945 DOA: 05/24/2022     8 DOS: the patient was seen and examined on 06/01/2022   Brief hospital course: 77 y.o. male with medical history significant of PAF, CAD s/p stent, ICM with EF of 25% as of Nov 2023, PAD s/p aorto bifembypass in 1992 and fem-fem bypass (maybe with fem-pop) by Dr. Donnetta Hutching in 2011, HTN, DM2, COPD quit smoking in 2022.   Pt with diverticulitis with abscess, initially on Apr 08, 2022.  Had IR drain placed.  Seemed to be doing better for month of Jan, drainage had basically stopped.  But then sudden worsening of abd pain and increased drain output saw him admitted to [facility] on 2/1.  IR repositioned drain on 2/2 but had ongoing feculent output despite this and ABx.  Ultimately surgery offered on 2/6 but family wanted him transferred for the surgery to larger hospital.  Pt transferred to [facility]  Patient underwent exploratory  laparotomy with Hartman's procedure for complicated diverticulitis with coloatmospheric Fistula by Dr Bobbye Morton 2/08.    Assessment and Plan:  Diverticulitis of large intestine with abscess:  -Failed medical treatment and IR drainage attempts x2 with ongoing feculant output around drain, 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 -Patient transfer [facility] for Surgery.  -Underwent exploratory  laparotomy with Hartman's procedure for complicated diverticulitis with coloatmospheric Fistula by Dr Bobbye Morton 2/08. -General Surgery following. Post op care per surgery.  -IR drain in place. Will need drain removed prior to d/c -Completed 5 days of zosyn -Tolerating soft diet. Report nausea, PRN zofran.  -Will add PPI   Sick Euthyroid.  -TSH 6.794 with free T4 of 1.31 elevated.  Free t3 : normal.  Needs TSH in 4 weeks.     AKI (acute kidney injury) ([place]) Creat at [facility] during admit: 1.6->1.0-> 1.4 Renal function improved with hydration -resolved, cr down to 1.1   SYSTOLIC  HEART FAILURE, CHRONIC, EF severely reduce.  Cont BB Reportedly did require dose of lasix for concerns of volume overload on 2/3 at [facility] Started on Entresto by cardiology 2/12. Continue with Spironolactone  Evaluated by cardiology for  pre op, thought to be high risk for procedure.  On Norvac.  Resume [facility] out patient.   DM2 (diabetes mellitus, type 2) (HCC) Currently on semglee  SSI   Paroxysmal atrial fibrillation (HCC) Reportedly ran out of xarelto at home last month -Initially held [place] for surgery. Xarelto resumed 2/12  CAD with known RCA occlusion -Cardiology following -resumed ASA and xarelto per Cardiology recs. OK to give per General Surgery -cont BB -Continue on spironolactone -Entresto ras resumed per Cardiology -Cardiology recommended Farxiga before d/c  Subjective: report nausea, for last several days. Tolerating diet.    Physical Exam: Vitals:   05/31/22 2111 06/01/22 0557 06/01/22 0921 06/01/22 1016  BP: (!) 105/55 (!) 143/79 (!) 109/59 128/67  Pulse: 72 80 81 78  Resp: 18 18 14 18  $ Temp: 98 F (36.7 C) 97.9 F (36.6 C) 97.7 F (36.5 C) 98.3 F (36.8 C)  TempSrc: Oral Oral Oral   SpO2: 92% 94% 93% 94%  Weight:      Height:       General exam: NAD Respiratory system: CTA Cardiovascular system: S 1, S 2 RRR Gastrointestinal system: BS present, soft, colostomy in place with bag, midline incision with stale, penrose drain in place SS drainage, JP drain in place. Left side drain.  Central nervous system:  Alert, conversant.  Extremities: no edema  Data Reviewed:    Family Communication: Pt in room, family not at bedside  Disposition: Status is: Inpatient Remains inpatient appropriate because: Severity of illness  Planned Discharge Destination: Skilled nursing facility, denied by insurance.     Author: Elmarie Shiley, MD 06/01/2022 3:25 PM  For on call review [email or web address].

## 2022-06-02 DIAGNOSIS — K5720 Diverticulitis of large intestine with perforation and abscess without bleeding: Secondary | ICD-10-CM | POA: Diagnosis not present

## 2022-06-02 LAB — BASIC METABOLIC PANEL
Anion gap: 9 (ref 5–15)
BUN: 10 mg/dL (ref 8–23)
CO2: 25 mmol/L (ref 22–32)
Calcium: 8.3 mg/dL — ABNORMAL LOW (ref 8.9–10.3)
Chloride: 101 mmol/L (ref 98–111)
Creatinine, Ser: 1.13 mg/dL (ref 0.61–1.24)
GFR, Estimated: >60 mL/min (ref >60)
Glucose, Bld: 170 mg/dL — ABNORMAL HIGH (ref 70–99)
Potassium: 4.1 mmol/L (ref 3.5–5.1)
Sodium: 135 mmol/L (ref 135–145)

## 2022-06-02 LAB — CBC
HCT: 33.8 % — ABNORMAL LOW (ref 39.0–52.0)
Hemoglobin: 11.4 g/dL — ABNORMAL LOW (ref 13.0–17.0)
MCH: 29.2 pg (ref 26.0–34.0)
MCHC: 33.7 g/dL (ref 30.0–36.0)
MCV: 86.4 fL (ref 80.0–100.0)
Platelets: 456 10*3/uL — ABNORMAL HIGH (ref 150–400)
RBC: 3.91 MIL/uL — ABNORMAL LOW (ref 4.22–5.81)
RDW: 18.3 % — ABNORMAL HIGH (ref 11.5–15.5)
WBC: 10.7 10*3/uL — ABNORMAL HIGH (ref 4.0–10.5)
nRBC: 0 % (ref 0.0–0.2)

## 2022-06-02 LAB — GLUCOSE, CAPILLARY
Glucose-Capillary: 152 mg/dL — ABNORMAL HIGH (ref 70–99)
Glucose-Capillary: 217 mg/dL — ABNORMAL HIGH (ref 70–99)
Glucose-Capillary: 184 mg/dL — ABNORMAL HIGH (ref 70–99)
Glucose-Capillary: 205 mg/dL — ABNORMAL HIGH (ref 70–99)

## 2022-06-02 NOTE — Progress Notes (Signed)
Progress Note  7 Days Post-Op  Subjective: No new complaints. Tells me insurance denied SNF so he is going [facility] member [facility] [place]. Expresses come concern about ostomy management.  Objective: Vital signs in last 24 hours: Temp:  [97.8 F (36.6 C)-98.3 F (36.8 C)] 98 F (36.7 C) (02/15 0856) Pulse Rate:  [80-88] 81 (02/15 0856) Resp:  [14-18] 17 (02/15 0856) BP: (83-148)/(64-70) 123/68 (02/15 0856) SpO2:  [95 %-100 %] 96 % (02/15 0856) Last BM Date : 06/01/22  Intake/Output from previous day: 02/14 0701 - 02/15 0700 In: 740 [P.O.:740] Out: 1220 [Urine:1100; Drains:70; Stool:50] Intake/Output this shift: No intake/output data recorded.  PE: General: pleasant, WD, thin male who is laying in bed in NAD Lungs:  Respiratory effort nonlabored Abd: stoma viable with stool in ostomy pouch, RLQ JP SS- removed. Midline penrose - removed. Some purulence from inferior wound so I'm going to remove staples. Continue IR drain.   Lab Results:  Recent Labs    06/01/22 0052 06/02/22 0300  WBC 10.4 10.7*  HGB 11.9* 11.4*  HCT 36.9* 33.8*  PLT 474* 456*   BMET Recent Labs    06/01/22 0052 06/02/22 0300  NA 135 135  K 4.2 4.1  CL 101 101  CO2 28 25  GLUCOSE 133* 170*  BUN 10 10  CREATININE 1.17 1.13  CALCIUM 8.5* 8.3*   PT/INR No results for input(s): "LABPROT", "INR" in the last 72 hours. CMP     Component Value Date/Time   NA 135 06/02/2022 0300   NA 138 03/28/2022 1038   K 4.1 06/02/2022 0300   CL 101 06/02/2022 0300   CO2 25 06/02/2022 0300   GLUCOSE 170 (H) 06/02/2022 0300   BUN 10 06/02/2022 0300   BUN 11 03/28/2022 1038   CREATININE 1.13 06/02/2022 0300   CREATININE 1.20 09/10/2013 0513   CALCIUM 8.3 (L) 06/02/2022 0300   PROT 4.8 (L) 06/01/2022 0052   PROT 6.4 03/28/2022 1038   ALBUMIN 1.9 (L) 06/01/2022 0052   ALBUMIN 3.2 (L) 03/28/2022 1038   AST 12 (L) 06/01/2022 0052   ALT 11 06/01/2022 0052   ALKPHOS 46 06/01/2022 0052    BILITOT <0.1 (L) 06/01/2022 0052   BILITOT 0.5 03/28/2022 1038   GFRNONAA >60 06/02/2022 0300   GFRAA 82 02/09/2018 0721   Lipase  No results found for: "LIPASE"     Studies/Results: No results found.  Anti-infectives: Anti-infectives (From admission, onward)    Start     Dose/Rate Route Frequency Ordered Stop   05/26/22 1600  piperacillin-tazobactam (ZOSYN) IVPB 3.375 g        3.375 g 12.5 mL/hr over 240 Minutes Intravenous Every 8 hours 05/26/22 1429 05/30/22 2056   05/25/22 0000  piperacillin-tazobactam (ZOSYN) IVPB 3.375 g  Status:  Discontinued        3.375 g 12.5 mL/hr over 240 Minutes Intravenous Every 8 hours 05/24/22 2126 05/26/22 1429        Assessment/Plan  POD#6 - s/p ex lap with Hartmann's procedure for complicated diverticulitis with coloatmospheric fistula by Dr. Bobbye Morton 2/8 - IR drain to remain in place, feculent appearing  - surgical drain SS fluid - removed - WOC following for new colostomy, fistula - continue soft diet and ensure TID  - mobilize with PT/OT - now planning SNF on DC - completed IV abx, no leukocytosis and afebrile  - prealbumin <5 - continue ensure - removed midline penrose drain. Mild redness around incision and some purulence,  D/C staples and start wet-to-dry. - stable for DC from surgery standpoint, will need Home health for wet to dry midline and ostomy care OR insurance appeal for SNF - he does have increased wound care needs after I changed his wound care/removed staples. - will need follow up with Dr. Bobbye Morton and in IR clinic   FEN - SOFT, ensure  DVT - SCDs, xarelto, ok to resume ASA also if needed; hgb stable ID - completed 5 days Zosyn    - per TRH-  CAD s/p stenting, known RCA occlusion - xarelto resumed  Chronic systolic CHF - EF [id number] - SSI PAF - xarelto 2/12 AKI Hypothyroidism   LOS: 9 days     Jill Alexanders, [facility] Surgery 06/02/2022, 10:22 AM Please see Amion for pager number during  day hours 7:00am-4:30pm

## 2022-06-02 NOTE — Progress Notes (Signed)
Referring Physician(s): Moorhead,Andrew S.  Supervising Physician: Corrie Mckusick  Patient Status:  [facility] - In-pt  Chief Complaint:  Diverticular abscess s/p drain placement 05/12/22 at [place] and upsized 05/23/22  Subjective:  Patient more sleepy today.   Agreeable to assessment, but limited interaction.   Lunch at bedside untouched.   Allergies: Lisinopril  Medications: Prior to Admission medications   Medication Sig Start Date End Date Taking? Authorizing Provider  amLODipine (NORVASC) 5 MG tablet Take 1 tablet (5 mg total) by mouth daily. 07/08/21  Yes Burnell Blanks, MD  aspirin EC 81 MG tablet Take 1 tablet (81 mg total) by mouth daily. Swallow whole. 05/10/21  Yes Burnell Blanks, MD  budesonide-formoterol [facility]) 160-4.5 MCG/ACT inhaler Inhale 2 puffs into the lungs 2 (two) times daily as needed for wheezing or shortness of breath.   Yes [provider]  carvedilol (COREG) 12.5 MG tablet Take 12.5 mg by mouth 2 (two) times daily with a meal.   Yes [provider]  cetirizine (ZYRTEC) 10 MG tablet Take 10 mg by mouth daily. 02/01/22  Yes [provider]  glipiZIDE (GLUCOTROL) 10 MG tablet Take 1 tablet (10 mg total) by mouth 2 (two) times daily. 03/28/22  Yes Garwin Brothers, MD  rivaroxaban (XARELTO) 20 MG TABS tablet Take 20 mg by mouth daily with supper.   Yes [provider]  rosuvastatin (CRESTOR) 20 MG tablet Take 1 tablet (20 mg total) by mouth daily. 07/08/21  Yes Burnell Blanks, MD  sacubitril-valsartan (ENTRESTO) 24-26 MG Take 1 tablet by mouth 2 (two) times daily.   Yes [provider]  sertraline (ZOLOFT) 50 MG tablet Take 1 tablet (50 mg total) by mouth daily. Patient not taking: Reported on 05/24/2022 03/28/22 03/28/23  Garwin Brothers, MD  XARELTO 20 MG TABS tablet TAKE 1 TABLET BY MOUTH DAILY WITH SUPPER Patient not taking: Reported on 05/24/2022 07/26/21   Burnell Blanks, MD     Vital  Signs: BP 123/68 (BP Location: Right Arm)   Pulse 81   Temp 98 F (36.7 C) (Oral)   Resp 17   Ht 5' 7"$  (1.702 m)   Wt 155 lb 3.3 oz (70.4 kg)   SpO2 96%   BMI 24.31 kg/m   Physical Exam Vitals reviewed.  Constitutional:      General: He is not in acute distress. Pulmonary:     Effort: Pulmonary effort is normal.  Abdominal:     Comments: Colostomy bag in place, surgical drain in RLQ, IR drain in LLQ.  LLQ drain in place with ongoing feculent-appearing output.  Suture no longer visible and drain has slightly eroded the skin at the insertion site.  No leakage.  Flushes without issue.   Neurological:     Mental Status: He is alert and oriented to person, place, and time.  Psychiatric:        Mood and Affect: Mood normal.        Behavior: Behavior normal.     Interval imaging/drain manipulation:  CT Abdomen Pelvis WO Contrast 05/25/22 -Concern for fistulous connection from abscess to proximal sigmoid colon   Imaging: No results found.  Labs:  CBC: Recent Labs    05/30/22 0318 05/31/22 0235 06/01/22 0052 06/02/22 0300  WBC 9.8 10.5 10.4 10.7*  HGB 10.9* 12.1* 11.9* 11.4*  HCT 33.1* 37.3* 36.9* 33.8*  PLT 476* 540* 474* 456*     COAGS: Recent Labs    05/28/22 1146 05/28/22 1614 05/29/22 1135 05/30/22  0318  APTT 88* 92* 71* 46*     BMP: Recent Labs    05/29/22 1135 05/30/22 0318 06/01/22 0052 06/02/22 0300  NA 135 140 135 135  K 3.8 4.4 4.2 4.1  CL 105 106 101 101  CO2 23 25 28 25  $ GLUCOSE 198* 112* 133* 170*  BUN 11 10 10 10  $ CALCIUM 8.1* 8.4* 8.5* 8.3*  CREATININE 1.10 1.02 1.17 1.13  GFRNONAA >60 >60 >60 >60     LIVER FUNCTION TESTS: Recent Labs    05/28/22 0410 05/29/22 1135 05/30/22 0318 06/01/22 0052  BILITOT 0.8 0.7 0.3 <0.1*  AST 16 14* 14* 12*  ALT 10 11 12 11  $ ALKPHOS 29* 31* 34* 46  PROT 5.3* 4.9* 4.8* 4.8*  ALBUMIN 2.2* 2.0* 1.8* 1.9*     Assessment and Plan:  Diverticular abscess s/p drain placement 05/12/22 with  drain upsize 05/23/22 Ongoing feculent-appearing output via drain.  Site with some breakdown today as there is some evidence of skin erosion.  Suture no longer visible.  Drain continues to work well.  Will monitor output and skin site for possible need for drain exchange.  Keep dressing in place to secure.    Plan: Discontinue saline flushes d/t fistulous connection Record output Q shift. Dressing changes QD or PRN if soiled.  Call IR APP or on call IR MD if difficulty flushing or sudden change in drain output.  Repeat imaging/possible drain injection once output < 10 mL/QD (excluding flush material). Consideration for drain removal if output is < 10 mL/QD (excluding flush material), pending discussion with the providing surgical service.  Discharge planning: Patient's outpatient follow-up orders for IR drain clinic have been placed.   Electronically Signed: Docia Barrier, PA-C 06/02/2022, 2:55 PM   I spent a total of 15 Minutes at the the patient's bedside AND on the patient's hospital floor or unit, greater than 50% of which was counseling/coordinating care for diverticular abscess

## 2022-06-02 NOTE — Progress Notes (Signed)
PROGRESS NOTE [name]  [id number] DOB: May 03, 1945 DOA: 05/24/2022 PCP: Garwin Brothers, MD   Brief Narrative/Hospital Course: 77 y.o.m w/  PAF, CAD s/p stent, ICM with EF of 25% as of Nov 2023, PAD s/p aorto bifembypass in 1992 and fem-fem bypass (maybe with fem-pop) by Dr. Donnetta Hutching in 2011, HTN, DM2, COPD quit smoking in 2022  Pt with diverticulitis with abscess, initially on Apr 08, 2022.  Had IR drain placed.Seemed to be doing better for month of Jan, drainage had basically stopped.  But then sudden worsening of abd pain and increased drain output saw him admitted to [facility] on 2/1.  IR repositioned drain on 2/2 but had ongoing feculent output despite this and ABx.  Ultimately surgery offered on 2/6 but family wanted him transferred for the surgery to larger hospital.  Pt transferred to [facility]. Patient underwent exploratory  laparotomy with Hartman's procedure for complicated diverticulitis with coloatmospheric Fistula by Dr Bobbye Morton 2/08. Skilled nursing facility was denied BY INSURANCE. Surgery saw the patient 2/17 staples removed and started wet-to-dry dressing, midline Penrose drain was removed  2/15.Okay for discharge. CCS and TOC okay with  home with home health for wet-to-dry midline and ostomy care or insurance appeal for SNF.   Subjective: Seen examined this morning he reports no new complaints, resting comfortably. Overnight BP stable although transient low in evening,otherwise afebrile on room air Labs shows CBC fairly stable, and renal function is stable. He feels ready to go home.  Assessment and Plan: Principal Problem:   Diverticulitis of large intestine with abscess Active Problems:   SYSTOLIC HEART FAILURE, CHRONIC   AKI (acute kidney injury) ([place])   Hypothyroidism   PAF (paroxysmal atrial fibrillation) (HCC)   DM2 (diabetes mellitus, type 2) (HCC)   Chronic systolic (congestive) heart failure (HCC)   Protein-calorie malnutrition, severe  Diverticulitis of large intestine with  abscess s/p exploratory  laparotomy with Hartman's procedure 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 Failed medical management and IR drain x 2 and had ongoing feculent output around the drain> so transferred from RM> [facility] for surgery evaluation.Surgery saw the patient 2/17 staples removed and started wet-to-dry dressing, midline Penrose drain was removed  2/15.Okay for discharge.CCS and TOC okay with  home with home health for wet-to-dry midline and ostomy care or insurance appeal for SNF. Completed zosyn x5 days.  Continue PPI continue diet antibiotics.  I reached out to surgery team to discuss with patient's daughter about wound care and follow-up,reached out to the daughter but no answer  Sick euthyroid with-TSH 6.794 with free T4 of 1.31 elevated.Free t3 : normal. Obtain tsh in 4 wk AKI peaked to 1.6 at [facility] on admission, resolved Chronic systolic CHF with severely reduced EF.Reportedly  needed lasix for volume overload on 2/3 at [facility].  Currently euvolemic continue Entresto, Aldactone, beta-blocker as per cardiology.  Monitor volume status continue follow-up with cardiology as outpatient., resume [place] outpatient Hypertension BP controlled Norvasc S/P were stable, on Semglee 8 units at bedtime,sssi [id number] ran out of xarelto at home last month, Initially held [place] for surgery.  Xarelto resumed since 2/12.   CAD with known RCA occlusion: Seen by cardiology aspirin Xarelto, Coreg follow-up with GDMT as above. Severe Protein calorie malnutrition augment diet, dietitian following Nutrition Problem: Severe Malnutrition Etiology: chronic illness (COPD, diverticulitis with abscess) Signs/Symptoms: percent weight loss, moderate fat depletion, moderate muscle depletion, severe muscle depletion Percent weight loss: 8.9 % (over 3 months) Interventions: Refer to RD note for recommendations   DVT  prophylaxis:  Code Status:   Code Status: Full Code Family Communication: plan of care  discussed with patient at bedside. Patient status is: inpatient because of safe disposition Level of care: Telemetry Medical   Dispo: The patient is from: home            Anticipated disposition: Anticipate discharge to home with home health, called the daughter unable to reach her Objective: Vitals last 24 hrs: Vitals:   06/01/22 1630 06/01/22 2056 06/02/22 0512 06/02/22 0856  BP: (!) 148/68 113/69 109/64 123/68  Pulse:  88 80 81  Resp:  18 16 17  $ Temp:  98.3 F (36.8 C) 97.8 F (36.6 C) 98 F (36.7 C)  TempSrc:  Oral Oral Oral  SpO2:  100% 95% 96%  Weight:      Height:       Weight change:   Physical Examination: General exam: alert awake, older than stated age HEENT:Oral mucosa moist, Ear/Nose WNL grossly Respiratory system: bilaterally clear BS, no use of accessory muscle Cardiovascular system: S1 & S2 +, No JVD. Gastrointestinal system: Abdomen soft, reveals surgical site with dressing in place with drains present, with left-sided colostomy present  Nervous System:Alert, awake, moving extremities. Extremities: LE edema neg,distal peripheral pulses palpable.  Skin: No rashes,no icterus. MSK: Normal muscle bulk,tone, power  Medications reviewed:  Scheduled Meds:  acetaminophen  1,000 mg Oral Q6H   amLODipine  5 mg Oral Daily   aspirin EC  81 mg Oral Daily   carvedilol  12.5 mg Oral BID WC   Chlorhexidine Gluconate Cloth  6 each Topical Daily   feeding supplement  237 mL Oral TID WC   insulin aspart  0-9 Units Subcutaneous TID AC & HS   insulin glargine-yfgn  8 Units Subcutaneous QHS   methocarbamol  500 mg Oral Q6H   pantoprazole  40 mg Oral Daily   rivaroxaban  20 mg Oral Q supper   sacubitril-valsartan  1 tablet Oral BID   spironolactone  25 mg Oral Daily   Continuous Infusions:    Diet Order             DIET SOFT Room service appropriate? Yes; Fluid consistency: Thin  Diet effective now           Diet - low sodium heart healthy                     Nutrition Problem: Severe Malnutrition Etiology: chronic illness (COPD, diverticulitis with abscess) Signs/Symptoms: percent weight loss, moderate fat depletion, moderate muscle depletion, severe muscle depletion Percent weight loss: 8.9 % (over 3 months) Interventions: Refer to RD note for recommendations   Intake/Output Summary (Last 24 hours) at 06/02/2022 1419 Last data filed at 06/02/2022 0900 Gross per 24 hour  Intake 460 ml  Output 1170 ml  Net -710 ml   Net IO Since Admission: 3,777.31 mL [06/02/22 1419]  Wt Readings from Last 3 Encounters:  05/26/22 70.4 kg  04/22/22 71.3 kg  04/08/22 74 kg     Unresulted Labs (From admission, onward)     Start     Ordered   05/27/22 0500  CBC  Daily,   R     See Hyperspace for full Linked Orders Report.   05/26/22 1533          Data Reviewed: I have personally reviewed following labs and imaging studies CBC: Recent Labs  Lab 05/29/22 1135 05/30/22 0318 05/31/22 0235 06/01/22 0052 06/02/22 0300  WBC 11.5* 9.8 10.5  10.4 10.7*  HGB 11.6* 10.9* 12.1* 11.9* 11.4*  HCT 36.4* 33.1* 37.3* 36.9* 33.8*  MCV 88.3 87.1 87.4 88.3 86.4  PLT 484* 476* 540* 474* [id number]*   Basic Metabolic Panel: Recent Labs  Lab 05/28/22 0410 05/29/22 1135 05/30/22 0318 06/01/22 0052 06/02/22 0300  NA 134* 135 140 135 135  K 3.4* 3.8 4.4 4.2 4.1  CL 102 105 106 101 101  CO2 23 23 25 28 25  $ GLUCOSE 232* 198* 112* 133* 170*  BUN 13 11 10 10 10  $ CREATININE 1.12 1.10 1.02 1.17 1.13  CALCIUM 8.2* 8.1* 8.4* 8.5* 8.3*  MG 2.0 1.7  --  1.9  --   PHOS 3.3 2.8  --   --   --    Recent Labs  Lab 05/27/22 0325 05/28/22 0410 05/29/22 1135 05/30/22 0318 06/01/22 0052  AST 15 16 14* 14* 12*  ALT 11 10 11 12 11  $ ALKPHOS 29* 29* 31* 34* 46  BILITOT 1.5* 0.8 0.7 0.3 <0.1*  PROT 5.4* 5.3* 4.9* 4.8* 4.8*  ALBUMIN 2.4* 2.2* 2.0* 1.8* 1.9*   Recent Results (from the past 240 hour(s))  MRSA Next Gen by PCR, Nasal     Status: None   Collection Time:  05/24/22  7:59 PM   Specimen: Nasal Mucosa; Nasal Swab  Result Value Ref Range Status   MRSA by PCR Next Gen NOT DETECTED NOT DETECTED Final    Comment: (NOTE) The GeneXpert MRSA Assay (FDA approved for NASAL specimens only), is one component of a comprehensive MRSA colonization surveillance program. It is not intended to diagnose MRSA infection nor to guide or monitor treatment for MRSA infections. Test performance is not FDA approved in patients less than 63 years old. Performed at [facility], [place] [address] [place], [place] [address]     Antimicrobials: Anti-infectives (From admission, onward)    Start     Dose/Rate Route Frequency Ordered Stop   05/26/22 1600  piperacillin-tazobactam (ZOSYN) IVPB 3.375 g        3.375 g 12.5 mL/hr over 240 Minutes Intravenous Every 8 hours 05/26/22 1429 05/30/22 2056   05/25/22 0000  piperacillin-tazobactam (ZOSYN) IVPB 3.375 g  Status:  Discontinued        3.375 g 12.5 mL/hr over 240 Minutes Intravenous Every 8 hours 05/24/22 2126 05/26/22 1429      Culture/Microbiology No results found for: "SDES", "SPECREQUEST", "CULT", "REPTSTATUS"  Other culture-see note  Radiology Studies: No results found.   LOS: 9 days   Antonieta Pert, MD [facility]  06/02/2022, 2:19 PM

## 2022-06-02 NOTE — Progress Notes (Signed)
Physical Therapy Treatment Patient Details Name: [name] MRN: [id number] DOB: 12-10-1945 Today's Date: 06/02/2022   History of Present Illness 77 y.o. male admitted 2/6. s/p ex lap with Hartmann's procedure 2/8 for Complicated diverticulitis with coloatmospheric fistula. PMHx: PAF, CAD s/p stent, ICM with EF of 25%, PAD s/p aorto bifembypass, HTN, DM2, COPD    PT Comments    Continues to make good progress towards functional goals. Focused on higher level gait and dynamic mobility tasks. Mild instability but able to self correct with AD. We did discuss continued use of RW while ambulating after d/c until more stable with gait. Tolerated exercises well. Patient will continue to benefit from skilled physical therapy services to further improve independence with functional mobility.    Recommendations for follow up therapy are one component of a multi-disciplinary discharge planning process, led by the attending physician.  Recommendations may be updated based on patient status, additional functional criteria and insurance authorization.  Follow Up Recommendations  Home health PT Can patient physically be transported by private vehicle: Yes   Assistance Recommended at Discharge PRN  Patient can return home with the following A little help with bathing/dressing/bathroom;Assistance with cooking/housework;Assist for transportation;Help with stairs or ramp for entrance   Equipment Recommendations  None recommended by PT    Recommendations for Other Services       Precautions / Restrictions Precautions Precautions: Fall Restrictions Weight Bearing Restrictions: No     Mobility  Bed Mobility Overal bed mobility: Modified Independent             General bed mobility comments: No assist, slight extra time to manage to EOB.    Transfers Overall transfer level: Needs assistance Equipment used: None Transfers: Sit to/from Stand Sit to Stand: Supervision            General transfer comment: Supervision for safety. No physical assist needed, able to rise without UE support from bed.    Ambulation/Gait Ambulation/Gait assistance: Supervision Gait Distance (Feet): 200 Feet Assistive device: None Gait Pattern/deviations: Step-through pattern, Decreased stride length Gait velocity: decr Gait velocity interpretation: <1.8 ft/sec, indicate of risk for recurrent falls   General Gait Details: Supervision for safety, performed without AD to assess and progress safety and independence. Cues for symmetry and awarness. No buckling or overt LOB, mild instability present but able to self correct. Performed dynamic challenges with vertical nods and horiziontal head turns. Changes in gait speed and direction. Tolerated well.   Stairs             Wheelchair Mobility    Modified Rankin (Stroke Patients Only)       Balance Overall balance assessment: Needs assistance Sitting-balance support: No upper extremity supported, Feet supported Sitting balance-Leahy Scale: Good     Standing balance support: No upper extremity supported, During functional activity Standing balance-Leahy Scale: Good                              Cognition Arousal/Alertness: Awake/alert Behavior During Therapy: WFL for tasks assessed/performed Overall Cognitive Status: Within Functional Limits for tasks assessed                                          Exercises General Exercises - Lower Extremity Ankle Circles/Pumps: Strengthening, Both, 10 reps, Seated Long Arc Quad: Strengthening, Both, 10 reps, Supine Hip ABduction/ADduction:  Strengthening, Both, 10 reps, Seated Hip Flexion/Marching: Strengthening, Both, 10 reps, Seated Mini-Sqauts: Strengthening, Both, 10 reps, Standing    General Comments        Pertinent Vitals/Pain Pain Assessment Pain Assessment: No/denies pain Pain Intervention(s): Monitored during session    Home Living                           Prior Function            PT Goals (current goals can now be found in the care plan section) Acute Rehab PT Goals Patient Stated Goal: Get well PT Goal Formulation: With patient Time For Goal Achievement: 06/10/22 Potential to Achieve Goals: Good Progress towards PT goals: Progressing toward goals    Frequency    Min 3X/week      PT Plan Current plan remains appropriate    Co-evaluation              AM-PAC PT "6 Clicks" Mobility   Outcome Measure  Help needed turning from your back to your side while in a flat bed without using bedrails?: None Help needed moving from lying on your back to sitting on the side of a flat bed without using bedrails?: None Help needed moving to and from a bed to a chair (including a wheelchair)?: None Help needed standing up from a chair using your arms (e.g., wheelchair or bedside chair)?: A Little Help needed to walk in hospital room?: A Little Help needed climbing 3-5 steps with a railing? : A Little 6 Click Score: 21    End of Session Equipment Utilized During Treatment: Gait belt Activity Tolerance: Patient tolerated treatment well Patient left: with call bell/phone within reach;in bed;with bed alarm set Nurse Communication: Mobility status PT Visit Diagnosis: Unsteadiness on feet (R26.81);Other abnormalities of gait and mobility (R26.89);Muscle weakness (generalized) (M62.81);Difficulty in walking, not elsewhere classified (R26.2)     Time: [id number] PT Time Calculation (min) (ACUTE ONLY): 13 min  Charges:  $Gait Training: 8-22 mins                     [name], PT, DPT Physical Therapist Acute Rehabilitation Services [facility]    [name] 06/02/2022, 2:31 PM

## 2022-06-03 ENCOUNTER — Other Ambulatory Visit (HOSPITAL_COMMUNITY): Payer: Self-pay

## 2022-06-03 DIAGNOSIS — K5720 Diverticulitis of large intestine with perforation and abscess without bleeding: Secondary | ICD-10-CM | POA: Diagnosis not present

## 2022-06-03 LAB — GLUCOSE, CAPILLARY
Glucose-Capillary: 127 mg/dL — ABNORMAL HIGH (ref 70–99)
Glucose-Capillary: 188 mg/dL — ABNORMAL HIGH (ref 70–99)

## 2022-06-03 LAB — CBC
HCT: 33.8 % — ABNORMAL LOW (ref 39.0–52.0)
Hemoglobin: 11.3 g/dL — ABNORMAL LOW (ref 13.0–17.0)
MCH: 29 pg (ref 26.0–34.0)
MCHC: 33.4 g/dL (ref 30.0–36.0)
MCV: 86.9 fL (ref 80.0–100.0)
Platelets: 408 10*3/uL — ABNORMAL HIGH (ref 150–400)
RBC: 3.89 MIL/uL — ABNORMAL LOW (ref 4.22–5.81)
RDW: 18.6 % — ABNORMAL HIGH (ref 11.5–15.5)
WBC: 11 10*3/uL — ABNORMAL HIGH (ref 4.0–10.5)
nRBC: 0 % (ref 0.0–0.2)

## 2022-06-03 MED ORDER — RIVAROXABAN 20 MG PO TABS
20.0000 mg | ORAL_TABLET | Freq: Every day | ORAL | 0 refills | Status: DC
  Filled 2022-06-03: qty 30, 30d supply, fill #0

## 2022-06-03 MED ORDER — OXYCODONE HCL 5 MG PO TABS
5.0000 mg | ORAL_TABLET | Freq: Four times a day (QID) | ORAL | 0 refills | Status: DC | PRN
  Filled 2022-06-03: qty 20, 5d supply, fill #0

## 2022-06-03 NOTE — Progress Notes (Signed)
DISCHARGE NOTE HOME [name] to be discharged Home per MD order. Discussed prescriptions and follow up appointments with the patient. Prescriptions given to patient; medication list explained in detail. Patient verbalized understanding.  Skin clean, dry and intact without evidence of skin break down, no evidence of skin tears noted. IV catheter discontinued intact. Site without signs and symptoms of complications. Dressing and pressure applied. Pt denies pain at the site currently. No complaints noted.  Patient free of lines, drains, and wounds.   An After Visit Summary (AVS) was printed and given to the patient. Patient escorted via wheelchair, and discharged home via private auto.  Keenan Bachelor, RN

## 2022-06-03 NOTE — Progress Notes (Signed)
Referring Physician(s): Moorhead,Andrew S.  Supervising Physician: Sandi Mariscal  Patient Status:  [facility] - In-pt  Chief Complaint:  Diverticular abscess s/p LLQ intra abdominal abscess drain placement at [facility] on 2.5.24  Subjective:  Patient states he is being discharged home.   Allergies: Lisinopril  Medications: Prior to Admission medications   Medication Sig Start Date End Date Taking? Authorizing Provider  amLODipine (NORVASC) 5 MG tablet Take 1 tablet (5 mg total) by mouth daily. 07/08/21  Yes Burnell Blanks, MD  aspirin EC 81 MG tablet Take 1 tablet (81 mg total) by mouth daily. Swallow whole. 05/10/21  Yes Burnell Blanks, MD  budesonide-formoterol [facility]) 160-4.5 MCG/ACT inhaler Inhale 2 puffs into the lungs 2 (two) times daily as needed for wheezing or shortness of breath.   Yes [provider]  carvedilol (COREG) 12.5 MG tablet Take 12.5 mg by mouth 2 (two) times daily with a meal.   Yes [provider]  cetirizine (ZYRTEC) 10 MG tablet Take 10 mg by mouth daily. 02/01/22  Yes [provider]  glipiZIDE (GLUCOTROL) 10 MG tablet Take 1 tablet (10 mg total) by mouth 2 (two) times daily. 03/28/22  Yes Garwin Brothers, MD  rosuvastatin (CRESTOR) 20 MG tablet Take 1 tablet (20 mg total) by mouth daily. 07/08/21  Yes Burnell Blanks, MD  sacubitril-valsartan (ENTRESTO) 24-26 MG Take 1 tablet by mouth 2 (two) times daily.   Yes [provider]  rivaroxaban (XARELTO) 20 MG TABS tablet Take 1 tablet (20 mg total) by mouth daily with supper. 06/03/22 07/03/22  Antonieta Pert, MD  sertraline (ZOLOFT) 50 MG tablet Take 1 tablet (50 mg total) by mouth daily. Patient not taking: Reported on 05/24/2022 03/28/22 03/28/23  Garwin Brothers, MD  XARELTO 20 MG TABS tablet TAKE 1 TABLET BY MOUTH DAILY WITH SUPPER Patient not taking: Reported on 05/24/2022 07/26/21   Burnell Blanks, MD     Vital Signs: BP (!) 104/53 (BP  Location: Right Arm)   Pulse 81   Temp 98 F (36.7 C) (Oral)   Resp 18   Ht 5' 7"$  (1.702 m)   Wt 155 lb 3.3 oz (70.4 kg)   SpO2 96%   BMI 24.31 kg/m   Physical Exam Vitals and nursing note reviewed.  Constitutional:      Appearance: He is well-developed.  HENT:     Head: Normocephalic.  Pulmonary:     Effort: Pulmonary effort is normal.  Abdominal:     Comments: Positive LLQ  drain to gravity bag. Site is unremarkable with mild erythema around drain exit site. No edema, tenderness, bleeding or drainage noted at exit site. No suture .Stat lock placed. Dressing is clean dry and intact. < 2  ml of  feculent colored fluid noted in the gravity bag. Feculent material aspirated. Drain is able to be flushed easily. No leakage or pain with flushing.  Insertion site clean and dry.     Musculoskeletal:        General: Normal range of motion.     Cervical back: Normal range of motion.  Skin:    General: Skin is dry.  Neurological:     Mental Status: He is alert and oriented to person, place, and time.  Psychiatric:        Mood and Affect: Mood normal.        Behavior: Behavior normal.        Thought Content: Thought content normal.  Judgment: Judgment normal.     Imaging: No results found.  Labs:  CBC: Recent Labs    05/31/22 0235 06/01/22 0052 06/02/22 0300 06/03/22 0227  WBC 10.5 10.4 10.7* 11.0*  HGB 12.1* 11.9* 11.4* 11.3*  HCT 37.3* 36.9* 33.8* 33.8*  PLT 540* 474* 456* 408*    COAGS: Recent Labs    05/28/22 1146 05/28/22 1614 05/29/22 1135 05/30/22 0318  APTT 88* 92* 71* 46*    BMP: Recent Labs    05/29/22 1135 05/30/22 0318 06/01/22 0052 06/02/22 0300  NA 135 140 135 135  K 3.8 4.4 4.2 4.1  CL 105 106 101 101  CO2 23 25 28 25  $ GLUCOSE 198* 112* 133* 170*  BUN 11 10 10 10  $ CALCIUM 8.1* 8.4* 8.5* 8.3*  CREATININE 1.10 1.02 1.17 1.13  GFRNONAA >60 >60 >60 >60    LIVER FUNCTION TESTS: Recent Labs    05/28/22 0410 05/29/22 1135  05/30/22 0318 06/01/22 0052  BILITOT 0.8 0.7 0.3 <0.1*  AST 16 14* 14* 12*  ALT 10 11 12 11  $ ALKPHOS 29* 31* 34* 46  PROT 5.3* 4.9* 4.8* 4.8*  ALBUMIN 2.2* 2.0* 1.8* 1.9*    Assessment and Plan:  77 y.o. male inpatient. Smoker. History of HTN, CHF, bladder cancer, PCD, a fib, DM. Presented to ED at [place] to have a diverticular abscess. IR placed  a LLQ  intra abdominal abscess drain at [facility] on 1.25.24. Patient was transferred to [facility] for further evaluation and intervention. S/p a Hartman's pouch on 2.8.24.   Drain Location: LLQ Size: Fr size: 10 Fr Date of placement: 1.25.24  Currently to: Drain collection device: gravity  24 hour output:  Output by Drain (mL) 06/01/22 0701 - 06/01/22 1900 06/01/22 1901 - 06/02/22 0700 06/02/22 0701 - 06/02/22 1900 06/02/22 1901 - 06/03/22 0700 06/03/22 0701 - 06/03/22 1109  Closed System Drain Left LLQ Other (Comment)  0  10   Open Drain 1 Midline Other (Comment)   0     Feculent material aspiration. Scant amount of output noted to be in the gravity bag.  Interval imaging/drain manipulation:  2.7.24 - CT Abd pelvis  Current examination: Flushes/aspirates easily. Aspirates feculent material Insertion site unremarkable. Stat lock placed. No  suture visible. Dressed appropriately.   Plan: Stop flushing Record output Q shift. Dressing changes QD or PRN if soiled.  Call IR APP or on call IR MD if sudden change in drain output.   Discharge planning: Outpatient orders in place for patient to be followed up in [place] clinic. Typically patient will follow up with IR clinic 10-14 days post d/c for repeat imaging/possible drain injection. IR scheduler will contact patient with date/time of appointment. Patient will need to record output QD, dressing changes every 2-3 days or earlier if soiled.   IR will continue to follow - please call with questions or concerns.        Electronically Signed: Jacqualine Mau, NP 06/03/2022, 11:07 AM   I spent a total of 15 Minutes at the patient's bedside AND on the patient's hospital floor or unit, greater than 50% of which was counseling/coordinating care for LLQ abscess drain

## 2022-06-03 NOTE — TOC Transition Note (Signed)
Transition of Care [facility]) - CM/SW Discharge Note   Patient Details  Name: [name] MRN: [id number] Date of Birth: November 15, 1945  Transition of Care [facility]) CM/SW Contact:  Tom-Johnson, Renea Ee, RN Phone Number: 06/03/2022, 12:13 PM   Clinical Narrative:     Patient is scheduled for discharge today. Home Health info on AVS. CM spoke with patient and daughter about disposition. Daughter, Donella Stade states patient will be staying with her the weekend and the go stay with his sister on Sunday. Crystal spoke with [place] agency, [facility] and agreed to Monday 06/06/22 as start date. Crystal states she will be able to care for Colostomy and LLQ drain during the weekend. MD notified.  Crystal to transport at discharge. No further TOC needs noted.          Final next level of care: Home w Home Health Services Barriers to Discharge: Barriers Resolved   Patient Goals and CMS Choice CMS Medicare.gov Compare Post Acute Care list provided to:: Patient Choice offered to / list presented to : Patient, Adult Children (Daughter, Crystal)  Discharge Placement                  Patient to be transferred to facility by: Family      Discharge Plan and Services Additional resources added to the After Visit Summary for     Discharge Planning Services: CM Consult                      HH Arranged: PT, RN, OT, Disease Management, Nurse's Aide HH Agency: [place] Date So [facility] Agency Contacted: 06/01/22 Time [place]: 1630 Representative spoke with at [place]: [place] Determinants of Health ([place]) Interventions SDOH Screenings   Food Insecurity: No Food Insecurity (05/24/2022)  Housing: Low Risk  (05/24/2022)  Transportation Needs: No Transportation Needs (05/24/2022)  Utilities: Not At Risk (05/24/2022)  Depression (PHQ2-9): High Risk (03/28/2022)  Tobacco Use: Medium Risk (05/27/2022)     Readmission Risk Interventions     No data to display

## 2022-06-03 NOTE — Discharge Instructions (Addendum)
MIDLINE WOUND CARE: - midline dressing to be changed twice daily - supplies: saline, kerlix (rolled gauze) or gauze, scissors, ABD pads, tape  - remove dressing and all packing carefully - clean edges of skin around the wound with water/gauze, making sure there is no tape debris or leakage left on skin that could cause skin irritation or breakdown. - dampen a clean kerlix with sterile saline and pack wound from wound base to skin level, making sure to take note of any possible areas of wound tracking, tunneling and packing appropriately. Wound can be packed loosely. Trim kerlix to size if a whole kerlix is not required. - cover wound with a dry ABD pad and secure with tape.  - apply any skin protectant/powder recommended by clinician to protect skin/skin folds. - change dressing as needed if leakage occurs, wound gets contaminated, or patient requests to shower. - patient may shower daily with wound open and following the shower the wound should be dried and a clean dressing placed.          [facility], [place] [phone]  OPEN ABDOMINAL SURGERY: POST OP INSTRUCTIONS  Always review your discharge instruction sheet given to you by the facility where your surgery was performed.  IF YOU HAVE DISABILITY OR FAMILY LEAVE FORMS, YOU MUST BRING THEM TO THE OFFICE FOR PROCESSING.  PLEASE DO NOT GIVE THEM TO YOUR DOCTOR.  A prescription for pain medication may be given to you upon discharge.  Take your pain medication as prescribed, if needed.  If narcotic pain medicine is not needed, then you may take acetaminophen (Tylenol) or ibuprofen (Advil) as needed. Take your usually prescribed medications unless otherwise directed. If you need a refill on your pain medication, please contact your pharmacy. They will contact our office to request authorization.  Prescriptions will not be filled after 5pm or on week-ends. You should follow a light diet the first few days after arrival  home, such as soup and crackers, pudding, etc.unless your doctor has advised otherwise. A high-fiber, low fat diet can be resumed as tolerated.   Be sure to include lots of fluids daily. Most patients will experience some swelling and bruising on the chest and neck area.  Ice packs will help.  Swelling and bruising can take several days to resolve Most patients will experience some swelling and bruising in the area of the incision. Ice pack will help. Swelling and bruising can take several days to resolve..  It is common to experience some constipation if taking pain medication after surgery.  Increasing fluid intake and taking a stool softener will usually help or prevent this problem from occurring.  A mild laxative (Milk of Magnesia or Miralax) should be taken according to package directions if there are no bowel movements after 48 hours.  You may have steri-strips (small skin tapes) in place directly over the incision.  These strips should be left on the skin for 7-10 days.  If your surgeon used skin glue on the incision, you may shower in 24 hours.  The glue will flake off over the next 2-3 weeks.  Any sutures or staples will be removed at the office during your follow-up visit. You may find that a light gauze bandage over your incision may keep your staples from being rubbed or pulled. You may shower and replace the bandage daily. ACTIVITIES:  You may resume regular (light) daily activities beginning the next day--such as daily self-care, walking, climbing stairs--gradually increasing  activities as tolerated.  You may have sexual intercourse when it is comfortable.  Refrain from any heavy lifting or straining until approved by your doctor. You may drive when you no longer are taking prescription pain medication, you can comfortably wear a seatbelt, and you can safely maneuver your car and apply brakes Return to Work: ___________________________________ Dennis Bast should see your doctor in the office for a  follow-up appointment approximately two weeks after your surgery.  Make sure that you call for this appointment within a day or two after you arrive home to insure a convenient appointment time. OTHER INSTRUCTIONS:  _____________________________________________________________ _____________________________________________________________  WHEN TO CALL YOUR DOCTOR: Fever over 101.0 Inability to urinate Nausea and/or vomiting Extreme swelling or bruising Continued bleeding from incision. Increased pain, redness, or drainage from the incision. Difficulty swallowing or breathing Muscle cramping or spasms. Numbness or tingling in hands or feet or around lips.  The clinic staff is available to answer your questions during regular business hours.  Please don't hesitate to call and ask to speak to one of the nurses if you have concerns.  For further questions, please visit [email or web address]

## 2022-06-03 NOTE — Progress Notes (Signed)
Initial Nutrition Assessment  DOCUMENTATION CODES:   Severe malnutrition in context of chronic illness  INTERVENTION:  - Continue Ensure Enlive po TID, each supplement provides 350 kcal and 20 grams of protein.  NUTRITION DIAGNOSIS:   Severe Malnutrition related to chronic illness (COPD, diverticulitis with abscess) as evidenced by percent weight loss, moderate fat depletion, moderate muscle depletion, severe muscle depletion.  GOAL:   Patient will meet greater than or equal to 90% of their needs  MONITOR:   PO intake, Supplement acceptance, Diet advancement, Labs, Weight trends, I & O's, Skin  REASON FOR ASSESSMENT:   Malnutrition Screening Tool    ASSESSMENT:   77 year old male with PMHx of PAF, CAD s/p stent, ICM with EF 25% Nov 2023, PAD s/p aorto bifembypass in 1992 and fem-fem bypass 2011, HTN, DM type 2, COPD, diverticulitis with abscess s/p IR drain placement admitted to outside hospital on 05/19/22 and underwent repositioning of drain on 05/20/22, transferred to [facility] on 05/24/22 and is now s/p exploratory laparotomy, takedown of splenic flexure, partial colectomy, colostomy creation on 05/26/22.  Meds reviewed: sliding scale insulin, semglee (8 units); aldactone. Labs reviewed.   The pt reports that he has been eating well over the past several days. Per record, pt has been eating 50-100% of his meals over the past few days. Pt also reports that he is also drinking his supplements. Pt likely meeting his needs at this time. RD will continue to monitor PO intakes.   Diet Order:   Diet Order             DIET SOFT Room service appropriate? Yes; Fluid consistency: Thin  Diet effective now           Diet - low sodium heart healthy                   EDUCATION NEEDS:   Not appropriate for education at this time  Skin:  Skin Assessment: Skin Integrity Issues: Skin Integrity Issues:: Incisions Incisions: closed incision to abdomen  Last BM:  2/16  Height:    Ht Readings from Last 1 Encounters:  05/26/22 5' 7"$  (1.702 m)    Weight:   Wt Readings from Last 1 Encounters:  05/26/22 70.4 kg    Ideal Body Weight:  67.3 kg  BMI:  Body mass index is 24.31 kg/m.  Estimated Nutritional Needs:   Kcal:  2000-2200  Protein:  100-110 grams  Fluid:  2-2.2 L/day  Thalia Bloodgood, RD, LDN, CNSC.

## 2022-06-03 NOTE — Discharge Summary (Addendum)
Physician Discharge Summary  [name] [id number] DOB: 1945-07-31 DOA: 05/24/2022  PCP: Garwin Brothers, MD  Admit date: 05/24/2022 Discharge date: 06/03/2022 Recommendations for Outpatient Follow-up:  Follow up with PCP in 1 weeks-call for appointment Follow-up with general surgery 1 wk Please obtain BMP/CBC in one week  Discharge Dispo: Home with home health Discharge Condition: Stable Code Status:   Code Status: Full Code Diet recommendation:  Diet Order             DIET SOFT Room service appropriate? Yes; Fluid consistency: Thin  Diet effective now           Diet - low sodium heart healthy                    Brief/Interim Summary: 76 y.o.m w/  PAF, CAD s/p stent, ICM with EF of 25% as of Nov 2023, PAD s/p aorto bifembypass in 1992 and fem-fem bypass (maybe with fem-pop) by Dr. Donnetta Hutching in 2011, HTN, DM2, COPD quit smoking in 2022  Pt with diverticulitis with abscess, initially on Apr 08, 2022.  Had IR drain placed.Seemed to be doing better for month of Jan, drainage had basically stopped.  But then sudden worsening of abd pain and increased drain output saw him admitted to [facility] on 2/1.  IR repositioned drain on 2/2 but had ongoing feculent output despite this and ABx.  Ultimately surgery offered on 2/6 but family wanted him transferred for the surgery to larger hospital.  Pt transferred to [facility]. Patient underwent exploratory  laparotomy with Hartman's procedure for complicated diverticulitis with coloatmospheric Fistula by Dr Bobbye Morton 2/08. Skilled nursing facility was denied BY INSURANCE. Surgery saw the patient 2/17 staples removed and started wet-to-dry dressing, midline Penrose drain was removed  2/15.Okay for discharge. CCS and TOC okay with  home with home health for wet-to-dry midline and ostomy care or insurance appeal for SNF. Snf denied- he was cleared for dc by ccs and daughter okay with it he will be going to sister's house TOC setting up [facility]  Discharge Diagnoses:  Principal  Problem:   Diverticulitis of large intestine with abscess Active Problems:   SYSTOLIC HEART FAILURE, CHRONIC   AKI (acute kidney injury) ([place])   Hypothyroidism   PAF (paroxysmal atrial fibrillation) (HCC)   DM2 (diabetes mellitus, type 2) (HCC)   Chronic systolic (congestive) heart failure (HCC)   Protein-calorie malnutrition, severe  Diverticulitis of large intestine with abscess s/p exploratory  laparotomy with Hartman's procedure 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 Failed medical management and IR drain x 2 and had ongoing feculent output around the drain> so transferred from [facility] for surgery evaluation.Surgery saw the patient 2/17 staples removed and started wet-to-dry dressing, midline Penrose drain was removed  2/15.Okay for discharge.CCS and TOC okay with  home with home health for wet-to-dry midline and ostomy care or insurance appeal for SNF. Completed zosyn x5 days.Continue diet.I reached out to surgery team to discuss with patient's daughter about wound care and follow-up,reached out to the daughter and is agreeable for dc.  Sick euthyroid with-TSH 6.794 with free T4 of 1.31 elevated.Free t3 :Normal. Obtain tsh in 4 wk AKI peaked to 1.6 at [facility] on admission, resolved Chronic systolic CHF with severely reduced EF.Reportedly  needed lasix for volume overload on 2/3 at [facility].  Currently euvolemic continue Entresto, Aldactone, beta-blocker as per cardiology.  Overall euvolemic, continue to follow-up with cardiology as outpatient Hypertension BP controlled Norvasc S/P were stable, on Semglee 8 units  at bedtime,sssi [id number] ran out of xarelto at home last month,Initially held [place] for surgery.  Xarelto resumed since 2/12 rx provided.   CAD with known RCA occlusion: Seen by cardiology aspirin Xarelto, Coreg follow-up with GDMT as above. Severe Protein calorie malnutrition augment diet, dietitian following Nutrition Problem: Severe Malnutrition Etiology: chronic  illness (COPD, diverticulitis with abscess) Signs/Symptoms: percent weight loss, moderate fat depletion, moderate muscle depletion, severe muscle depletion Percent weight loss: 8.9 % (over 3 months) Interventions: Refer to RD note for recommendations  Consults: CCCS  Subjective: [id number], he wants to go home  Discharge Exam: Vitals:   06/03/22 0739 06/03/22 0954  BP: 124/68 (!) 104/53  Pulse: 87 81  Resp:    Temp:  98 F (36.7 C)  SpO2:  96%   General: Pt is alert, awake, not in acute distress Cardiovascular: RRR, S1/S2 +, no rubs, no gallops Respiratory: CTA bilaterally, no wheezing, no rhonchi Abdominal: Soft, NT, ND, bowel sounds + left colostomy in place, midline incision with dry dressing, left lower quadrant drain in place Extremities: no edema, no cyanosis  Discharge Instructions  Discharge Instructions     (HEART FAILURE PATIENTS) Call MD:  Anytime you have any of the following symptoms: 1) 3 pound weight gain in 24 hours or 5 pounds in 1 week 2) shortness of breath, with or without a dry hacking cough 3) swelling in the hands, feet or stomach 4) if you have to sleep on extra pillows at night in order to breathe.   Complete by: As directed    Change dressing (specify)   Complete by: As directed    Dressing change: as needed   Diet - low sodium heart healthy   Complete by: As directed    Discharge instructions   Complete by: As directed    Continue to maintain drain for now.  Continue flush drain daily.  Keep dressing clean and dry.  May change as needed.  Do not submerge drain in water.  Keep log of output and bring with you to all appointments.  Schedulers will call to arrange follow-up.   Discharge instructions   Complete by: As directed    Please call call MD or return to ER for similar or worsening recurring problem that brought you to hospital or if any fever,nausea/vomiting,abdominal pain, uncontrolled pain, chest pain,  shortness of breath or any other  alarming symptoms.  Please follow-up your doctor as instructed in a week time and call the office for appointment.  Please avoid alcohol, smoking, or any other illicit substance and maintain healthy habits including taking your regular medications as prescribed.  You were cared for by a hospitalist during your hospital stay. If you have any questions about your discharge medications or the care you received while you were in the hospital after you are discharged, you can call the unit and ask to speak with the hospitalist on call if the hospitalist that took care of you is not available.  Once you are discharged, your primary care physician will handle any further medical issues. Please note that NO REFILLS for any discharge medications will be authorized once you are discharged, as it is imperative that you return to your primary care physician (or establish a relationship with a primary care physician if you do not have one) for your aftercare needs so that they can reassess your need for medications and monitor your lab values.   Discharge wound care:   Complete by: As directed    Twice  daily moist-to-dry dressing to midline abdominal wound.   Increase activity slowly   Complete by: As directed       Allergies as of 06/03/2022       Reactions   Lisinopril Swelling, Rash   Rash - face and tounge swell        Medication List     TAKE these medications    amLODipine 5 MG tablet Commonly known as: NORVASC Take 1 tablet (5 mg total) by mouth daily.   aspirin EC 81 MG tablet Take 1 tablet (81 mg total) by mouth daily. Swallow whole.   budesonide-formoterol 160-4.5 MCG/ACT inhaler Commonly known as: SYMBICORT Inhale 2 puffs into the lungs 2 (two) times daily as needed for wheezing or shortness of breath.   carvedilol 12.5 MG tablet Commonly known as: COREG Take 12.5 mg by mouth 2 (two) times daily with a meal.   cetirizine 10 MG tablet Commonly known as: ZYRTEC Take 10 mg  by mouth daily.   glipiZIDE 10 MG tablet Commonly known as: GLUCOTROL Take 1 tablet (10 mg total) by mouth 2 (two) times daily.   oxyCODONE 5 MG immediate release tablet Commonly known as: Oxy IR/ROXICODONE Take 1 tablet (5 mg total) by mouth every 6 (six) hours as needed for severe pain (not relieved by tylenol).   rosuvastatin 20 MG tablet Commonly known as: CRESTOR Take 1 tablet (20 mg total) by mouth daily.   sacubitril-valsartan 24-26 MG Commonly known as: ENTRESTO Take 1 tablet by mouth 2 (two) times daily.   sertraline 50 MG tablet Commonly known as: Zoloft Take 1 tablet (50 mg total) by mouth daily.   Xarelto 20 MG Tabs tablet Generic drug: rivaroxaban Take 1 tablet (20 mg total) by mouth daily with supper. What changed: Another medication with the same name was removed. Continue taking this medication, and follow the directions you see here.               Discharge Care Instructions  (From admission, onward)           Start     Ordered   06/03/22 0000  Discharge wound care:       Comments: Twice daily moist-to-dry dressing to midline abdominal wound.   06/03/22 1101   05/30/22 0000  Change dressing (specify)       Comments: Dressing change: as needed   05/30/22 1711            Follow-up Information     Mugweru, Wille Glaser, MD Follow up.   Specialties: Interventional Radiology, Diagnostic Radiology, Radiology Why: Schedulers will call with date and time of follow-up appointment. Contact information: [address] [place] [address] [phone]         Marylu Lund., NP Follow up.   Specialty: Cardiology Why: [place] location - cardiology follow-up has been arranged on Tuesday Jun 21, 2022 at 10:55 AM (Arrive by 10:40 AM). Jaquelyn Bitter is one of the nurse practitioners that is part of the same group as Dr. Agustin Cree. The appointment was scheduled in [facility] since Dr. Agustin Cree did not have any  appointments available in the timeframe needed for follow-up. Contact information: [address] [place] [address] [place], [place] Follow up.   Specialty: Home Health Services Why: Someone will call you to schedule first home visit. If you have not received a call after two days of discharging home,  call their number listed. If no one comes to assess, call Case Manager at [phone]. Contact information: [address] [place] [address] [phone]         Garwin Brothers, MD Follow up in 1 week(s).   Specialty: Internal Medicine Contact information: [address] [place] [address] [phone]         Jesusita Oka, MD Follow up on 06/16/2022.   Specialty: Surgery Why: at 9:00 AM for post-operative follow up with your surgeon. please arrive 20-30 minutes early. Contact information: [place] [address] [phone]                Allergies  Allergen Reactions   Lisinopril Swelling and Rash    Rash - face and tounge swell    The results of significant diagnostics from this hospitalization (including imaging, microbiology, ancillary and laboratory) are listed below for reference.    Microbiology: Recent Results (from the past 240 hour(s))  MRSA Next Gen by PCR, Nasal     Status: None   Collection Time: 05/24/22  7:59 PM   Specimen: Nasal Mucosa; Nasal Swab  Result Value Ref Range Status   MRSA by PCR Next Gen NOT DETECTED NOT DETECTED Final    Comment: (NOTE) The GeneXpert MRSA Assay (FDA approved for NASAL specimens only), is one component of a comprehensive MRSA colonization surveillance program. It is not intended to diagnose MRSA infection nor to guide or monitor treatment for MRSA infections. Test performance is not FDA approved in patients less than 45 years old. Performed at [facility], [place] [address],  [place], [place] [address]     Procedures/Studies: DG CHEST PORT 1 VIEW  Result Date: 05/26/2022 CLINICAL DATA:  Central line placement. EXAM: PORTABLE CHEST 1 VIEW COMPARISON:  05/21/2022 FINDINGS: Interval placement of right IJ central venous catheter with tip over the SVC. Lungs are adequately inflated with minimal hazy prominence of the central perihilar vessels suggesting mild degree of vascular congestion. No airspace consolidation or effusion. No pneumothorax. Cardiomediastinal silhouette and remainder of the exam is unchanged. IMPRESSION: 1. Suggestion of minimal vascular congestion. 2. Right IJ central venous catheter with tip over the SVC. Electronically Signed   By: Marin Olp M.D.   On: 05/26/2022 14:11   CT Angio Chest Pulmonary Embolism (PE) W or WO Contrast  Result Date: 05/25/2022 CLINICAL DATA:  Pulmonary embolus suspected with high probability. EXAM: CT ANGIOGRAPHY CHEST WITH CONTRAST TECHNIQUE: Multidetector CT imaging of the chest was performed using the standard protocol during bolus administration of intravenous contrast. Multiplanar CT image reconstructions and MIPs were obtained to evaluate the vascular anatomy. RADIATION DOSE REDUCTION: This exam was performed according to the departmental dose-optimization program which includes automated exposure control, adjustment of the mA and/or kV according to patient size and/or use of iterative reconstruction technique. CONTRAST:  49m OMNIPAQUE IOHEXOL 350 MG/ML SOLN COMPARISON:  11/30/2017 FINDINGS: Cardiovascular: Good opacification of the central and segmental pulmonary arteries. No focal filling defects. No evidence of significant pulmonary embolus. Normal heart size. No pericardial effusions. Normal caliber thoracic aorta. Calcification of the aorta and coronary arteries. Mediastinum/Nodes: Thyroid gland is unremarkable. Esophagus is decompressed. No significant lymphadenopathy. Lungs/Pleura: Small bilateral pleural effusions.  Atelectasis in the lung bases is likely compressive. Emphysematous changes in the lungs. Bronchial wall thickening likely representing chronic bronchitis. Upper Abdomen: Cholelithiasis with small stones in the gallbladder. No gallbladder wall thickening. Musculoskeletal: Degenerative  changes in the spine. Review of the MIP images confirms the above findings. IMPRESSION: 1. No evidence of significant pulmonary embolus. 2. Small bilateral pleural effusions with basilar atelectasis. 3. Cholelithiasis. Electronically Signed   By: Lucienne Capers M.D.   On: 05/25/2022 22:08   CT ABDOMEN PELVIS WO CONTRAST  Result Date: 05/25/2022 CLINICAL DATA:  Diverticulitis. EXAM: CT ABDOMEN AND PELVIS WITHOUT CONTRAST TECHNIQUE: Multidetector CT imaging of the abdomen and pelvis was performed following the standard protocol without IV contrast. RADIATION DOSE REDUCTION: This exam was performed according to the departmental dose-optimization program which includes automated exposure control, adjustment of the mA and/or kV according to patient size and/or use of iterative reconstruction technique. COMPARISON:  05/19/2022. FINDINGS: Lower chest: Subsegmental volume loss in the dependent right lower lobe with dependent atelectasis in the left lower lobe. Heart is at the upper limits of normal in size to mildly enlarged. Decreased attenuation of the intravascular compartment is indicative of anemia. No pericardial or pleural effusion. Distal esophagus is grossly unremarkable. Hepatobiliary: Liver is unremarkable. Stones in the gallbladder. No biliary ductal dilatation. Pancreas: Scattered calcifications in the pancreas. Otherwise unremarkable. Spleen: Negative. Adrenals/Urinary Tract: Right adrenal gland is unremarkable. Slight nodular thickening of the left adrenal gland. No specific follow-up necessary. Low-attenuation lesions in the kidneys, better seen on 05/19/2022. No specific follow-up necessary. Tiny left renal stone. Ureters  are decompressed. Bladder is grossly unremarkable. Stomach/Bowel: Stomach, small bowel and majority of the colon are unremarkable. There is a fistulous tract from the posterior margin of the proximal sigmoid colon to a collection of fluid and air in the left iliac fossa (3/50), measuring approximately 5.3 x 6.6 cm (3/53), similar to 05/19/2022 when remeasured in a similar fashion. Indwelling percutaneous drain with associated increased air. Abscess is continuous with and involves the left psoas muscle, similar. Appendectomy. Vascular/Lymphatic: Atherosclerotic calcification of the aorta. No pathologically enlarged lymph nodes. Aortobifemoral bypass graft. Reproductive: Prostate is visualized. Other: No free fluid. Mesenteries and peritoneum are otherwise unremarkable. Tiny midline supraumbilical ventral hernia contains fat. Musculoskeletal: Degenerative changes in the spine. Postoperative changes in the lumbar spine. No worrisome lytic or sclerotic lesions. IMPRESSION: 1. Grossly similar abscess in the left iliac fossa with a strongly suspected in fistulous connection to the proximal sigmoid colon, as on 05/19/2022. Interval exchange of the associated percutaneous drain and involvement of the left iliopsoas muscle. 2. Cholelithiasis. 3. Chronic calcific pancreatitis. 4. Tiny left renal stone. 5.  Aortic atherosclerosis (ICD10-I70.0). Electronically Signed   By: Lorin Picket M.D.   On: 05/25/2022 08:51    Labs: BNP (last 3 results) No results for input(s): "BNP" in the last 8760 hours. Basic Metabolic Panel: Recent Labs  Lab 05/28/22 0410 05/29/22 1135 05/30/22 0318 06/01/22 0052 06/02/22 0300  NA 134* 135 140 135 135  K 3.4* 3.8 4.4 4.2 4.1  CL 102 105 106 101 101  CO2 23 23 25 28 25  $ GLUCOSE 232* 198* 112* 133* 170*  BUN 13 11 10 10 10  $ CREATININE 1.12 1.10 1.02 1.17 1.13  CALCIUM 8.2* 8.1* 8.4* 8.5* 8.3*  MG 2.0 1.7  --  1.9  --   PHOS 3.3 2.8  --   --   --    Liver Function  Tests: Recent Labs  Lab 05/28/22 0410 05/29/22 1135 05/30/22 0318 06/01/22 0052  AST 16 14* 14* 12*  ALT 10 11 12 11  $ ALKPHOS 29* 31* 34* 46  BILITOT 0.8 0.7 0.3 <0.1*  PROT 5.3* 4.9* 4.8* 4.8*  ALBUMIN 2.2* 2.0* 1.8* 1.9*   No results for input(s): "LIPASE", "AMYLASE" in the last 168 hours. No results for input(s): "AMMONIA" in the last 168 hours. CBC: Recent Labs  Lab 05/30/22 0318 05/31/22 0235 06/01/22 0052 06/02/22 0300 06/03/22 0227  WBC 9.8 10.5 10.4 10.7* 11.0*  HGB 10.9* 12.1* 11.9* 11.4* 11.3*  HCT 33.1* 37.3* 36.9* 33.8* 33.8*  MCV 87.1 87.4 88.3 86.4 86.9  PLT 476* 540* 474* 456* 408*  CBG: Recent Labs  Lab 06/02/22 1120 06/02/22 1622 06/02/22 2120 06/03/22 0733 06/03/22 1122  GLUCAP 217* 184* 205* 127* 188*      Component Value Date/Time   COLORURINE GREEN BIOCHEMICALS MAY BE AFFECTED BY COLOR (A) 07/20/2009 0747   APPEARANCEUR CLOUDY (A) 07/20/2009 0747   LABSPEC 1.031 (H) 07/20/2009 0747   PHURINE 5.5 07/20/2009 0747   GLUCOSEU NEGATIVE 07/20/2009 0747   HGBUR LARGE (A) 07/20/2009 0747   BILIRUBINUR NEGATIVE 07/20/2009 0747   KETONESUR NEGATIVE 07/20/2009 0747   PROTEINUR 30 (A) 07/20/2009 0747   UROBILINOGEN 0.2 07/20/2009 0747   NITRITE NEGATIVE 07/20/2009 0747   LEUKOCYTESUR SMALL (A) 07/20/2009 0747   Sepsis Labs Recent Labs  Lab 05/31/22 0235 06/01/22 0052 06/02/22 0300 06/03/22 0227  WBC 10.5 10.4 10.7* 11.0*   Microbiology Recent Results (from the past 240 hour(s))  MRSA Next Gen by PCR, Nasal     Status: None   Collection Time: 05/24/22  7:59 PM   Specimen: Nasal Mucosa; Nasal Swab  Result Value Ref Range Status   MRSA by PCR Next Gen NOT DETECTED NOT DETECTED Final    Comment: (NOTE) The GeneXpert MRSA Assay (FDA approved for NASAL specimens only), is one component of a comprehensive MRSA colonization surveillance program. It is not intended to diagnose MRSA infection nor to guide or monitor treatment for MRSA  infections. Test performance is not FDA approved in patients less than 38 years old. Performed at [facility], [place] [address], [place], [place] [address]   Time coordinating discharge: 35 minutes  SIGNED: Antonieta Pert, MD  [facility] 06/03/2022, 1:27 PM  If 7PM-7AM, please contact night-coverage [email or web address]

## 2022-06-03 NOTE — Progress Notes (Signed)
Progress Note  8 Days Post-Op  Subjective: No new complaints.  Resting comfortably  Objective: Vital signs in last 24 hours: Temp:  [97.7 F (36.5 C)-98.4 F (36.9 C)] 97.7 F (36.5 C) (02/16 0532) Pulse Rate:  [80-87] 87 (02/16 0739) Resp:  [16-18] 18 (02/16 0532) BP: (123-138)/(59-68) 124/68 (02/16 0739) SpO2:  [93 %-96 %] 93 % (02/16 0532) Last BM Date : 06/02/22  Intake/Output from previous day: 02/15 0701 - 02/16 0700 In: 1130 [P.O.:1120] Out: 1385 [Urine:575; Drains:10; Stool:800] Intake/Output this shift: Total I/O In: 300 [P.O.:300] Out: -   PE: General: pleasant, WD, thin male who is laying in bed in NAD Lungs:  Respiratory effort nonlabored Abd: stoma viable with stool in ostomy pouch, LLQ IR drain with brown drainage, midline wound with dressing in place   Lab Results:  Recent Labs    06/02/22 0300 06/03/22 0227  WBC 10.7* 11.0*  HGB 11.4* 11.3*  HCT 33.8* 33.8*  PLT 456* 408*   BMET Recent Labs    06/01/22 0052 06/02/22 0300  NA 135 135  K 4.2 4.1  CL 101 101  CO2 28 25  GLUCOSE 133* 170*  BUN 10 10  CREATININE 1.17 1.13  CALCIUM 8.5* 8.3*   PT/INR No results for input(s): "LABPROT", "INR" in the last 72 hours. CMP     Component Value Date/Time   NA 135 06/02/2022 0300   NA 138 03/28/2022 1038   K 4.1 06/02/2022 0300   CL 101 06/02/2022 0300   CO2 25 06/02/2022 0300   GLUCOSE 170 (H) 06/02/2022 0300   BUN 10 06/02/2022 0300   BUN 11 03/28/2022 1038   CREATININE 1.13 06/02/2022 0300   CREATININE 1.20 09/10/2013 0513   CALCIUM 8.3 (L) 06/02/2022 0300   PROT 4.8 (L) 06/01/2022 0052   PROT 6.4 03/28/2022 1038   ALBUMIN 1.9 (L) 06/01/2022 0052   ALBUMIN 3.2 (L) 03/28/2022 1038   AST 12 (L) 06/01/2022 0052   ALT 11 06/01/2022 0052   ALKPHOS 46 06/01/2022 0052   BILITOT <0.1 (L) 06/01/2022 0052   BILITOT 0.5 03/28/2022 1038   GFRNONAA >60 06/02/2022 0300   GFRAA 82 02/09/2018 0721   Lipase  No results found for:  "LIPASE"     Studies/Results: No results found.  Anti-infectives: Anti-infectives (From admission, onward)    Start     Dose/Rate Route Frequency Ordered Stop   05/26/22 1600  piperacillin-tazobactam (ZOSYN) IVPB 3.375 g        3.375 g 12.5 mL/hr over 240 Minutes Intravenous Every 8 hours 05/26/22 1429 05/30/22 2056   05/25/22 0000  piperacillin-tazobactam (ZOSYN) IVPB 3.375 g  Status:  Discontinued        3.375 g 12.5 mL/hr over 240 Minutes Intravenous Every 8 hours 05/24/22 2126 05/26/22 1429        Assessment/Plan  POD#8 - s/p ex lap with Hartmann's procedure for complicated diverticulitis with coloatmospheric fistula by Dr. Bobbye Morton 2/8 - completed IV abx, no leukocytosis and afebrile  - IR drain (placed 1/25, upsized 2/5) remains in place for diverticulitis with abscess. Low suspicion for ongoing fistula, as the last CT 2/7 suggesting fistula was pre-operative. The diseased portion of colon has been removed. The drainage appears brown but it is likely just residual abscess and blood. Will discuss repeat imaging/drain injection with IR. Has been 9 days since last scan. - surgical drain SS fluid - removed 2/15 - WOC following for new colostomy - moist-to-dry dressing changes BID to midline wound, staples  and penrose removed 2/15 for wound infection. No need for abx. - will need follow up with Dr. Bobbye Morton and in IR clinic  - Patient and family express concerns about wound care at home. The patient now requires midline wound care, ostomy care, and drain care. Will discuss with hospitalist today if insurance can re-evaluate pt candidacy for SNF based on increased wound care needs. Otherwise pt and family will need to perform wound care at home with intermittent help from [facility] RN.  Patient is stable for discharge from [place], ensure; - prealbumin <5 - continue ensure DVT - SCDs, xarelto, 17m ASA ID - completed 5 days Zosyn    - per TR[facility]-  CAD s/p stenting,  known RCA occlusion - xarelto resumed  Chronic systolic CHF - EF [id number]- SSI PAF - xarelto 2/12 AKI Hypothyroidism   LOS: 10 days     EJill Alexanders PS[facility] Center[facility] 06/03/2022, 8:26 AM Please see Amion for pager number during day hours 7:00am-4:30pm

## 2022-06-03 NOTE — Consult Note (Signed)
[facility] Nurse ostomy follow up, 05/26/2022 exp lap w/partial colectomy and colostomy  Stoma type/location: LLQ colostomy  Stomal assessment/size: mildly edematous, oval 1" x 7/8" slightly above skin level Peristomal assessment: mild erythema circumferential  Treatment options for stomal/peristomal skin: crusted skin with skin barrier film and antifungal powder Output small amount (<20 mls) brown liquid  Ostomy pouching: 1pc convex Kellie Simmering [phone]) Education provided: Patient alone in room, spoke with daughter Crystal on phone who states she will be in later to pick dad up for transport home.  Offered to set up an additional teaching session with daughter but she states she feels okay with ostomy and is hopeful that this bag stays in place until home health gets there on Monday.  Patient is going to be staying with a  sister who has had no ostomy education starting Monday.   Discussed with Crystal that there are ostomy supplies in room for patient to take home (these were bagged up by this RN and placed in patients home bag), educational booklet in room with step by step procedure on how to change ostomy pouch, and referral has been made to ostomy clinic.  Crystal expressed her thanks and understanding.   Patient and I did review pouching change, when and how to empty the pouch, that the appliance needs to be changed twice a week or sooner if leaking.  Patient cannot see ostomy to change himself but does understand the procedure on how to do so.  This RN did change pouching system at this visit and walked through all steps with patient.    Enrolled patient in [place] Start Discharge program: Yes  WOC will continue to follow patient if he remains inpatient for further education and ostomy support.   Thank you,     Finis Hendricksen MSN, RN-BC, [employer]

## 2022-06-06 ENCOUNTER — Other Ambulatory Visit: Payer: Self-pay

## 2022-06-06 DIAGNOSIS — I5022 Chronic systolic (congestive) heart failure: Secondary | ICD-10-CM | POA: Diagnosis not present

## 2022-06-06 DIAGNOSIS — I493 Ventricular premature depolarization: Secondary | ICD-10-CM | POA: Diagnosis not present

## 2022-06-06 DIAGNOSIS — E039 Hypothyroidism, unspecified: Secondary | ICD-10-CM | POA: Diagnosis not present

## 2022-06-06 DIAGNOSIS — Z7901 Long term (current) use of anticoagulants: Secondary | ICD-10-CM | POA: Diagnosis not present

## 2022-06-06 DIAGNOSIS — Z483 Aftercare following surgery for neoplasm: Secondary | ICD-10-CM | POA: Diagnosis not present

## 2022-06-06 DIAGNOSIS — E1151 Type 2 diabetes mellitus with diabetic peripheral angiopathy without gangrene: Secondary | ICD-10-CM | POA: Diagnosis not present

## 2022-06-06 DIAGNOSIS — I4729 Other ventricular tachycardia: Secondary | ICD-10-CM | POA: Diagnosis not present

## 2022-06-06 DIAGNOSIS — M48061 Spinal stenosis, lumbar region without neurogenic claudication: Secondary | ICD-10-CM | POA: Diagnosis not present

## 2022-06-06 DIAGNOSIS — F321 Major depressive disorder, single episode, moderate: Secondary | ICD-10-CM | POA: Diagnosis not present

## 2022-06-06 DIAGNOSIS — I480 Paroxysmal atrial fibrillation: Secondary | ICD-10-CM | POA: Diagnosis not present

## 2022-06-06 DIAGNOSIS — E782 Mixed hyperlipidemia: Secondary | ICD-10-CM | POA: Diagnosis not present

## 2022-06-06 DIAGNOSIS — Z6824 Body mass index (BMI) 24.0-24.9, adult: Secondary | ICD-10-CM | POA: Diagnosis not present

## 2022-06-06 DIAGNOSIS — Z8551 Personal history of malignant neoplasm of bladder: Secondary | ICD-10-CM | POA: Diagnosis not present

## 2022-06-06 DIAGNOSIS — I110 Hypertensive heart disease with heart failure: Secondary | ICD-10-CM | POA: Diagnosis not present

## 2022-06-06 DIAGNOSIS — I252 Old myocardial infarction: Secondary | ICD-10-CM | POA: Diagnosis not present

## 2022-06-06 DIAGNOSIS — Z7951 Long term (current) use of inhaled steroids: Secondary | ICD-10-CM | POA: Diagnosis not present

## 2022-06-06 DIAGNOSIS — Z7984 Long term (current) use of oral hypoglycemic drugs: Secondary | ICD-10-CM | POA: Diagnosis not present

## 2022-06-06 DIAGNOSIS — I2510 Atherosclerotic heart disease of native coronary artery without angina pectoris: Secondary | ICD-10-CM | POA: Diagnosis not present

## 2022-06-06 DIAGNOSIS — I255 Ischemic cardiomyopathy: Secondary | ICD-10-CM | POA: Diagnosis not present

## 2022-06-06 DIAGNOSIS — J449 Chronic obstructive pulmonary disease, unspecified: Secondary | ICD-10-CM | POA: Diagnosis not present

## 2022-06-06 DIAGNOSIS — Z7982 Long term (current) use of aspirin: Secondary | ICD-10-CM | POA: Diagnosis not present

## 2022-06-06 DIAGNOSIS — Z48815 Encounter for surgical aftercare following surgery on the digestive system: Secondary | ICD-10-CM | POA: Diagnosis not present

## 2022-06-06 DIAGNOSIS — I70219 Atherosclerosis of native arteries of extremities with intermittent claudication, unspecified extremity: Secondary | ICD-10-CM | POA: Diagnosis not present

## 2022-06-06 DIAGNOSIS — E43 Unspecified severe protein-calorie malnutrition: Secondary | ICD-10-CM | POA: Diagnosis not present

## 2022-06-06 MED ORDER — SACUBITRIL-VALSARTAN 24-26 MG PO TABS: 1.0000 | ORAL_TABLET | Freq: Two times a day (BID) | ORAL | 0 refills | Status: DC

## 2022-06-07 DIAGNOSIS — I4729 Other ventricular tachycardia: Secondary | ICD-10-CM | POA: Diagnosis not present

## 2022-06-07 DIAGNOSIS — I5022 Chronic systolic (congestive) heart failure: Secondary | ICD-10-CM | POA: Diagnosis not present

## 2022-06-07 DIAGNOSIS — I255 Ischemic cardiomyopathy: Secondary | ICD-10-CM | POA: Diagnosis not present

## 2022-06-07 DIAGNOSIS — E1151 Type 2 diabetes mellitus with diabetic peripheral angiopathy without gangrene: Secondary | ICD-10-CM | POA: Diagnosis not present

## 2022-06-07 DIAGNOSIS — J449 Chronic obstructive pulmonary disease, unspecified: Secondary | ICD-10-CM | POA: Diagnosis not present

## 2022-06-07 DIAGNOSIS — Z483 Aftercare following surgery for neoplasm: Secondary | ICD-10-CM | POA: Diagnosis not present

## 2022-06-07 DIAGNOSIS — E039 Hypothyroidism, unspecified: Secondary | ICD-10-CM | POA: Diagnosis not present

## 2022-06-07 DIAGNOSIS — I493 Ventricular premature depolarization: Secondary | ICD-10-CM | POA: Diagnosis not present

## 2022-06-07 DIAGNOSIS — E43 Unspecified severe protein-calorie malnutrition: Secondary | ICD-10-CM | POA: Diagnosis not present

## 2022-06-07 DIAGNOSIS — F321 Major depressive disorder, single episode, moderate: Secondary | ICD-10-CM | POA: Diagnosis not present

## 2022-06-07 DIAGNOSIS — I110 Hypertensive heart disease with heart failure: Secondary | ICD-10-CM | POA: Diagnosis not present

## 2022-06-07 DIAGNOSIS — M48061 Spinal stenosis, lumbar region without neurogenic claudication: Secondary | ICD-10-CM | POA: Diagnosis not present

## 2022-06-07 DIAGNOSIS — Z48815 Encounter for surgical aftercare following surgery on the digestive system: Secondary | ICD-10-CM | POA: Diagnosis not present

## 2022-06-07 DIAGNOSIS — I70219 Atherosclerosis of native arteries of extremities with intermittent claudication, unspecified extremity: Secondary | ICD-10-CM | POA: Diagnosis not present

## 2022-06-07 DIAGNOSIS — I252 Old myocardial infarction: Secondary | ICD-10-CM | POA: Diagnosis not present

## 2022-06-07 DIAGNOSIS — I480 Paroxysmal atrial fibrillation: Secondary | ICD-10-CM | POA: Diagnosis not present

## 2022-06-08 ENCOUNTER — Other Ambulatory Visit: Payer: Self-pay | Admitting: Surgery

## 2022-06-08 ENCOUNTER — Other Ambulatory Visit (HOSPITAL_COMMUNITY): Payer: Self-pay | Admitting: Student

## 2022-06-08 DIAGNOSIS — K5720 Diverticulitis of large intestine with perforation and abscess without bleeding: Secondary | ICD-10-CM

## 2022-06-10 ENCOUNTER — Encounter: Payer: Self-pay | Admitting: Internal Medicine

## 2022-06-10 ENCOUNTER — Ambulatory Visit: Payer: Medicare Other | Admitting: Internal Medicine

## 2022-06-10 VITALS — BP 108/60 | HR 90 | Temp 98.2°F | Resp 16 | Ht 67.0 in | Wt 148.8 lb

## 2022-06-10 DIAGNOSIS — K5792 Diverticulitis of intestine, part unspecified, without perforation or abscess without bleeding: Secondary | ICD-10-CM | POA: Insufficient documentation

## 2022-06-10 DIAGNOSIS — K5720 Diverticulitis of large intestine with perforation and abscess without bleeding: Secondary | ICD-10-CM | POA: Diagnosis not present

## 2022-06-10 HISTORY — DX: Diverticulitis of intestine, part unspecified, without perforation or abscess without bleeding: K57.92

## 2022-06-10 NOTE — Progress Notes (Signed)
Office Visit  Subjective   Patient ID: [name]   DOB: 1945-08-09   Age: 77 y.o.   MRN: [id number]   Chief Complaint Chief Complaint  Patient presents with   Hospitalization Follow-up     History of Present Illness Mr. Schossow is a 77 yo male who comes in today for a hospital followup where he was admitted to [facility] from 05/24/2022 until 06/03/2022 from diverticulitis and abscess formation.  He was initially admitted to [facility] on 04/08/2022 where he presented to the ER with weakness, lethargy, dyspnea and abdominal pain.  He had a CT scan of abd/plevis done which showed diverticulitis of sigmoid with adjacent abscess and iliopsoas abscess.  He was admitted to [facility] from 04/08/2022 until 04/14/2022 for this where he underwent a CT guided left pelvic abscess drain on 04/08/2022.  Cultures grew out E coli.  The patient was placed on IV antibiotics and was transitioned to oral augmentin at discharge.  He was also noted to have a large central disc extrusion of the L1-L2 level with moderate to severe spinal canal stenosis on CT scan of his abd/pelvis.  He was discharged home on augmentin and flagyl with directions to followup with general surgery as an outpatient.  The patient did followup with general surgery and had a repeat CT scan on 05/12/2022 which showed an increase in size of his abscess where he was referred to the ER for continued care.  There was concern on the CT scan for a fistula between the abscess and colon.  He was admitted back to [facility] on 05/19/2022 and underwent removal of his drain with placement of a new percutaneous drain.  He was started on broad spectrum antibiotics.  General surgery recommended colon resection and colostomy formation.  Again, there was evidence of a possible fistulous tract from the site of the diveticulitis to the abscess cavity.  The patient was transferred to [facility] for second opion where he was admitted there on 05/24/2022. He underwent exploratory  laparotomy with  Hartman's procedure for complicated diverticulitis with coloatmospheric Fistula by Dr Bobbye Morton 05/26/2022.  His penrose drain was removed on 06/02/2022.  He seemed to improve.  They did a referral for skilled nursing facility placement but that was denied by his insurance.   Of note, while he was at [facility], he has acute renal failure where his creatinine peaked to 1.6.  He received IVF's and this resolved.  He has chronic systolic CHF with severely reduced EF.  He needed lasix for volume overload on 2/3 at [facility].  He became euvolemic and they continued him on Entresto, Aldactone, beta-blocker as per cardiology.  He did lose a lot of weight over the last few months but his appetite is now improved.  Today, he denies any fevers, chills, abdominal pain, nausea, vomiting, and no problems with output thru his colostomy.  He has a surgical abdominal wound in his midline abdomen that his daughter is doing wound care.  He goes back this Thursday for followup with surgery.     Past Medical History Past Medical History:  Diagnosis Date   Atherosclerosis of native arteries of the extremities with intermittent claudication 08/13/2013   Bladder cancer [facility])    resection x3   CAD, NATIVE VESSEL 09/22/2008   Qualifier: Diagnosis of  By: Sidney Ace     CARDIOMYOPATHY, ISCHEMIC 09/22/2008   Qualifier: Diagnosis of  By: Sidney Ace     Chronic systolic CHF (congestive heart failure) ([place])  Cigarette smoker    Coronary artery disease    status post DMI RX Taxus stent RCA 2006 with susequent Stent LAD and subsequent  stent thrombosis RCA unable to be opened 2006 -neg mv 10/2008, 10/16/17 ISR to pLAD with PTCA/DES, CTO of RCA with collaterals, EF 25%   Coronary artery disease involving native coronary artery of native heart with unstable angina pectoris ([place])    Diverticulitis of sigmoid colon 05/11/2021   HYPERLIPIDEMIA-MIXED 09/22/2008   Qualifier: Diagnosis of  By: Jaramillo, [place], BENIGN 09/22/2008    Qualifier: Diagnosis of  By: Sidney Ace     Ischemic cardiomyopathy    ejection fraction of 40-45%   Lumbar spinal stenosis    Myocardial infarction ([place])    "I've had 4" (10/16/2017)   NSVT (nonsustained ventricular tachycardia) (HCC)    Paroxysmal atrial fibrillation (HCC)    PVC's (premature ventricular contractions)    PVD 09/22/2008   Qualifier: Diagnosis of  By: Sidney Ace     Status post coronary artery stent placement    SYSTOLIC HEART FAILURE, CHRONIC 09/22/2008   Qualifier: Diagnosis of  By: Jaramillo, [employer] 05/29/2009   Qualifier: Diagnosis of  By: Earley Favor RN, BSN, Patricia L    Type II diabetes mellitus ([place])    Unstable angina ([place]) 10/16/2017     Allergies Allergies  Allergen Reactions   Lisinopril Swelling and Rash    Rash - face and tounge swell     Medications  Current Outpatient Medications:    amLODipine (NORVASC) 5 MG tablet, Take 1 tablet (5 mg total) by mouth daily., Disp: 90 tablet, Rfl: 3   aspirin EC 81 MG tablet, Take 1 tablet (81 mg total) by mouth daily. Swallow whole., Disp: 90 tablet, Rfl: 3   budesonide-formoterol (SYMBICORT) 160-4.5 MCG/ACT inhaler, Inhale 2 puffs into the lungs 2 (two) times daily as needed for wheezing or shortness of breath., Disp: , Rfl:    carvedilol (COREG) 12.5 MG tablet, Take 12.5 mg by mouth 2 (two) times daily with a meal., Disp: , Rfl:    cetirizine (ZYRTEC) 10 MG tablet, Take 10 mg by mouth daily., Disp: , Rfl:    glipiZIDE (GLUCOTROL) 10 MG tablet, Take 1 tablet (10 mg total) by mouth 2 (two) times daily., Disp: 60 tablet, Rfl: 6   oxyCODONE (OXY IR/ROXICODONE) 5 MG immediate release tablet, Take 1 tablet (5 mg total) by mouth every 6 (six) hours as needed for severe pain (not relieved by tylenol)., Disp: 20 tablet, Rfl: 0   rivaroxaban (XARELTO) 20 MG TABS tablet, Take 1 tablet (20 mg total) by mouth daily with supper., Disp: 30 tablet, Rfl: 0   rosuvastatin (CRESTOR) 20 MG tablet, Take 1 tablet (20  mg total) by mouth daily., Disp: 90 tablet, Rfl: 3   sacubitril-valsartan (ENTRESTO) 24-26 MG, Take 1 tablet by mouth 2 (two) times daily., Disp: 60 tablet, Rfl: 0   sertraline (ZOLOFT) 50 MG tablet, Take 1 tablet (50 mg total) by mouth daily. (Patient not taking: Reported on 05/24/2022), Disp: 30 tablet, Rfl: 2   Review of Systems Review of Systems  Constitutional:  Negative for chills and fever.  Respiratory:  Negative for cough and shortness of breath.   Cardiovascular:  Negative for chest pain, palpitations and leg swelling.  Gastrointestinal:  Negative for abdominal pain, blood in stool, melena, nausea and vomiting.  Neurological:  Negative for dizziness, weakness and headaches.       Objective:  Vitals BP 108/60   Pulse 90   Temp 98.2 F (36.8 C)   Resp 16   Ht '5\' 7"'$  (1.702 m)   Wt 148 lb 12.8 oz (67.5 kg)   SpO2 97%   BMI 23.31 kg/m    Physical Examination Physical Exam Constitutional:      Appearance: Normal appearance. He is not ill-appearing.  Cardiovascular:     Rate and Rhythm: Normal rate and regular rhythm.     Pulses: Normal pulses.     Heart sounds: No murmur heard.    No friction rub. No gallop.  Pulmonary:     Effort: Pulmonary effort is normal. No respiratory distress.     Breath sounds: No wheezing, rhonchi or rales.  Abdominal:     General: Abdomen is flat. Bowel sounds are normal. There is no distension.     Palpations: Abdomen is soft.     Tenderness: There is no abdominal tenderness.  Musculoskeletal:     Right lower leg: No edema.     Left lower leg: No edema.  Skin:    General: Skin is warm and dry.     Findings: No rash.  Neurological:     Mental Status: He is alert.        Assessment & Plan:   Diverticulitis of large intestine with abscess He is s/p exp lap with hartman's procedure.  He was not sent home on antibiotics but looking at his surgical abdominal wound, there is no signs of infection.  They will folllowup with surgery as  directed.    No follow-ups on file.   Townsend Roger, MD

## 2022-06-10 NOTE — Assessment & Plan Note (Signed)
He is s/p exp lap with hartman's procedure.  He was not sent home on antibiotics but looking at his surgical abdominal wound, there is no signs of infection.  They will folllowup with surgery as directed.

## 2022-06-15 ENCOUNTER — Ambulatory Visit
Admission: RE | Admit: 2022-06-15 | Discharge: 2022-06-15 | Disposition: A | Discharge status: Home / Self Care | Payer: Medicare Other | Source: Ambulatory Visit | Attending: Student | Admitting: Student

## 2022-06-15 DIAGNOSIS — K8020 Calculus of gallbladder without cholecystitis without obstruction: Secondary | ICD-10-CM | POA: Diagnosis not present

## 2022-06-15 DIAGNOSIS — K5720 Diverticulitis of large intestine with perforation and abscess without bleeding: Secondary | ICD-10-CM

## 2022-06-15 DIAGNOSIS — J449 Chronic obstructive pulmonary disease, unspecified: Secondary | ICD-10-CM | POA: Diagnosis not present

## 2022-06-15 DIAGNOSIS — N400 Enlarged prostate without lower urinary tract symptoms: Secondary | ICD-10-CM | POA: Diagnosis not present

## 2022-06-15 DIAGNOSIS — I480 Paroxysmal atrial fibrillation: Secondary | ICD-10-CM | POA: Diagnosis not present

## 2022-06-15 DIAGNOSIS — I493 Ventricular premature depolarization: Secondary | ICD-10-CM | POA: Diagnosis not present

## 2022-06-15 DIAGNOSIS — I110 Hypertensive heart disease with heart failure: Secondary | ICD-10-CM | POA: Diagnosis not present

## 2022-06-15 DIAGNOSIS — I5022 Chronic systolic (congestive) heart failure: Secondary | ICD-10-CM | POA: Diagnosis not present

## 2022-06-15 DIAGNOSIS — I255 Ischemic cardiomyopathy: Secondary | ICD-10-CM | POA: Diagnosis not present

## 2022-06-15 DIAGNOSIS — I252 Old myocardial infarction: Secondary | ICD-10-CM | POA: Diagnosis not present

## 2022-06-15 DIAGNOSIS — E039 Hypothyroidism, unspecified: Secondary | ICD-10-CM | POA: Diagnosis not present

## 2022-06-15 DIAGNOSIS — K6389 Other specified diseases of intestine: Secondary | ICD-10-CM | POA: Diagnosis not present

## 2022-06-15 DIAGNOSIS — I70219 Atherosclerosis of native arteries of extremities with intermittent claudication, unspecified extremity: Secondary | ICD-10-CM | POA: Diagnosis not present

## 2022-06-15 DIAGNOSIS — K6812 Psoas muscle abscess: Secondary | ICD-10-CM | POA: Diagnosis not present

## 2022-06-15 DIAGNOSIS — Z483 Aftercare following surgery for neoplasm: Secondary | ICD-10-CM | POA: Diagnosis not present

## 2022-06-15 DIAGNOSIS — M48061 Spinal stenosis, lumbar region without neurogenic claudication: Secondary | ICD-10-CM | POA: Diagnosis not present

## 2022-06-15 DIAGNOSIS — I4729 Other ventricular tachycardia: Secondary | ICD-10-CM | POA: Diagnosis not present

## 2022-06-15 DIAGNOSIS — Z48815 Encounter for surgical aftercare following surgery on the digestive system: Secondary | ICD-10-CM | POA: Diagnosis not present

## 2022-06-15 DIAGNOSIS — E43 Unspecified severe protein-calorie malnutrition: Secondary | ICD-10-CM | POA: Diagnosis not present

## 2022-06-15 DIAGNOSIS — F321 Major depressive disorder, single episode, moderate: Secondary | ICD-10-CM | POA: Diagnosis not present

## 2022-06-15 DIAGNOSIS — E1151 Type 2 diabetes mellitus with diabetic peripheral angiopathy without gangrene: Secondary | ICD-10-CM | POA: Diagnosis not present

## 2022-06-15 MED ORDER — IOPAMIDOL (ISOVUE-300) INJECTION 61%
100.0000 mL | Freq: Once | INTRAVENOUS | Status: AC | PRN
  Administered 2022-06-15: 100 mL via INTRAVENOUS

## 2022-06-15 NOTE — Progress Notes (Signed)
Referring Physician(s): Dr. Bobbye Morton  Chief Complaint: The patient is seen in follow up today s/p drain placement into iliopsoas abscess  History of present illness:  [name] is a 77 yo male who comes in today for a drain followup where he was admitted to [facility] from 05/24/2022 until 06/03/2022 from diverticulitis and abscess formation.  He was initially admitted to [facility] on 04/08/2022 where he presented to the ER with weakness, lethargy, dyspnea and abdominal pain.  He had a CT scan of abd/plevis done which showed diverticulitis of sigmoid with adjacent abscess and iliopsoas abscess.  He was admitted to [facility] from 04/08/2022 until 04/14/2022 for this where he underwent a CT guided left pelvic abscess drain on 04/08/2022.  Cultures grew out E coli.  The patient was placed on IV antibiotics and was transitioned to oral augmentin at discharge.   The patient did followup with general surgery and had a repeat CT scan on 05/12/2022 which showed an increase in size of his abscess where he was referred to the ER for continued care.  There was concern on the CT scan for a fistula between the abscess and colon.  He was admitted back to [facility] on 05/19/2022 and underwent removal of his drain with placement of a new percutaneous drain.  Drain injection at this time did not reveal fistula.  He was started on broad spectrum antibiotics.  He transferred to [facility] and underwent  laparotomy with Hartman's procedure for complicated diverticulitis.  His daughter accompanies him today and has been helping him care for his drain at home with daily flushing.  He is no longer having drain output and flushing results in leakage around the catheter onto the skin.  He reports being tired and daughter explains he is perpetually worn out since the start of his bowel issues in December.  They are working on optimizing nutrition.  He is no longer on antibiotics, is not experiencing fever/chills, nausea/vomiting.  He reports appointment with  Dr. Bobbye Morton scheduled for tomorrow 06/16/22  Past Medical History:  Diagnosis Date   Atherosclerosis of native arteries of the extremities with intermittent claudication 08/13/2013   Bladder cancer [facility])    resection x3   CAD, NATIVE VESSEL 09/22/2008   Qualifier: Diagnosis of  By: Jaramillo, [place], ISCHEMIC 09/22/2008   Qualifier: Diagnosis of  By: Sidney Ace     Chronic systolic CHF (congestive heart failure) ([place])    Cigarette smoker    Coronary artery disease    status post DMI RX Taxus stent RCA 2006 with susequent Stent LAD and subsequent  stent thrombosis RCA unable to be opened 2006 -neg mv 10/2008, 10/16/17 ISR to pLAD with PTCA/DES, CTO of RCA with collaterals, EF 25%   Coronary artery disease involving native coronary artery of native heart with unstable angina pectoris ([place])    Diverticulitis of sigmoid colon 05/11/2021   HYPERLIPIDEMIA-MIXED 09/22/2008   Qualifier: Diagnosis of  By: Jaramillo, [place], BENIGN 09/22/2008   Qualifier: Diagnosis of  By: Sidney Ace     Ischemic cardiomyopathy    ejection fraction of 40-45%   Lumbar spinal stenosis    Myocardial infarction ([place])    "I've had 4" (10/16/2017)   NSVT (nonsustained ventricular tachycardia) (H[facility])    Paroxysmal atrial fibrillation (HCC)    PVC's (premature ventricular contractions)    PVD 09/22/2008   Qualifier: Diagnosis of  By: Sidney Ace     Status post coronary artery stent  placement    SYSTOLIC HEART FAILURE, CHRONIC 09/22/2008   Qualifier: Diagnosis of  By: Sidney Ace     TOBACCO ABUSE 05/29/2009   Qualifier: Diagnosis of  By: Earley Favor, RN, BSN, Leonie Man    Type II diabetes mellitus ([place])    Unstable angina ([place]) 10/16/2017    Past Surgical History:  Procedure Laterality Date   AORTA - BILATERAL FEMORAL ARTERY BYPASS GRAFT Bilateral 1992   APPENDECTOMY     BACK SURGERY     COLECTOMY WITH COLOSTOMY CREATION/HARTMANN PROCEDURE N/A 05/26/2022   Procedure: COLECTOMY WITH  COLOSTOMY CREATION/HARTMANN PROCEDURE;  Surgeon: Jesusita Oka, MD;  Location: [facility];  Service: General;  Laterality: N/A;   CORONARY ANGIOPLASTY WITH STENT PLACEMENT     taxus stent  placed into his right coronary artery; stent placed to the LAD.     CORONARY STENT INTERVENTION N/A 10/16/2017   Procedure: CORONARY STENT INTERVENTION;  Surgeon: Burnell Blanks, MD;  Location: [place] [facility];  Service: Cardiovascular;  Laterality: N/A;   FEMORAL-FEMORAL BYPASS GRAFT  07/2009   LAPAROTOMY N/A 05/26/2022   Procedure: EXPLORATORY LAPAROTOMY;  Surgeon: Jesusita Oka, MD;  Location: [facility];  Service: General;  Laterality: N/A;   LUMBAR MICRODISCECTOMY  12/31/08   RIGHT/LEFT HEART CATH AND CORONARY ANGIOGRAPHY N/A 10/16/2017   Procedure: RIGHT/LEFT HEART CATH AND CORONARY ANGIOGRAPHY;  Surgeon: Burnell Blanks, MD;  Location: [place] [facility];  Service: Cardiovascular;  Laterality: N/A;   SHOULDER OPEN ROTATOR CUFF REPAIR Left     Allergies: Lisinopril  Medications: Prior to Admission medications   Medication Sig Start Date End Date Taking? Authorizing Provider  amLODipine (NORVASC) 5 MG tablet Take 1 tablet (5 mg total) by mouth daily. 07/08/21   Burnell Blanks, MD  aspirin EC 81 MG tablet Take 1 tablet (81 mg total) by mouth daily. Swallow whole. 05/10/21   Burnell Blanks, MD  budesonide-formoterol (SYMBICORT) 160-4.5 MCG/ACT inhaler Inhale 2 puffs into the lungs 2 (two) times daily as needed for wheezing or shortness of breath.    [provider]  carvedilol (COREG) 12.5 MG tablet Take 12.5 mg by mouth 2 (two) times daily with a meal.    [provider]  cetirizine (ZYRTEC) 10 MG tablet Take 10 mg by mouth daily. 02/01/22   [provider]  glipiZIDE (GLUCOTROL) 10 MG tablet Take 1 tablet (10 mg total) by mouth 2 (two) times daily. 03/28/22   Garwin Brothers, MD  oxyCODONE (OXY IR/ROXICODONE) 5 MG immediate release tablet Take 1 tablet  (5 mg total) by mouth every 6 (six) hours as needed for severe pain (not relieved by tylenol). 06/03/22   Jill Alexanders, PA-C  rivaroxaban (XARELTO) 20 MG TABS tablet Take 1 tablet (20 mg total) by mouth daily with supper. 06/03/22 07/03/22  Antonieta Pert, MD  rosuvastatin (CRESTOR) 20 MG tablet Take 1 tablet (20 mg total) by mouth daily. 07/08/21   Burnell Blanks, MD  sacubitril-valsartan (ENTRESTO) 24-26 MG Take 1 tablet by mouth 2 (two) times daily. 06/06/22   Garwin Brothers, MD  sertraline (ZOLOFT) 50 MG tablet Take 1 tablet (50 mg total) by mouth daily. Patient not taking: Reported on 05/24/2022 03/28/22 03/28/23  Garwin Brothers, MD     Family History  Problem Relation Age of Onset   Coronary artery disease Unknown    Diabetes Unknown    Cardiomyopathy Mother    Heart disease Mother    Hyperlipidemia Mother    Coronary artery  disease Father    Stroke Father    Diabetes Father    Heart disease Father        before age 68   Hyperlipidemia Father    Heart attack Father    Diabetes Sister    Heart disease Sister        before age 11   Hyperlipidemia Sister    Heart attack Sister     Social History   Socioeconomic History   Marital status: Married    Spouse name: Not on file   Number of children: 1   Years of education: Not on file   Highest education level: Not on file  Occupational History   Occupation: retired    Fish farm manager: RETIRED    Comment: truck driver  Tobacco Use   Smoking status: Former    Packs/day: 0.50    Years: 56.00    Total pack years: 28.00    Types: Cigars, Cigarettes    Quit date: 2022    Years since quitting: 2.1   Smokeless tobacco: Former    Types: Nurse, children's Use: Never used  Substance and Sexual Activity   Alcohol use: Yes    Comment: 10/16/2017 "1 beer q 2-3 months"   Drug use: Never   Sexual activity: Not Currently  Other Topics Concern   Not on file  Social History Narrative   Lives with girlfriend in [place].      Social Determinants of Health   Financial Resource Strain: Not on file  Food Insecurity: No Food Insecurity (05/24/2022)   Hunger Vital Sign    Worried About Running Out of Food in the Last Year: Never true    Ran Out of Food in the Last Year: Never true  Transportation Needs: No Transportation Needs (05/24/2022)   PRAPARE - Hydrologist (Medical): No    Lack of Transportation (Non-Medical): No  Physical Activity: Not on file  Stress: Not on file  Social Connections: Not on file     Vital Signs: Temp 97.5 BP 122/64 HR 84 O2 96%  Physical Exam Constitutional:      General: He is not in acute distress. HENT:     Head: Normocephalic and atraumatic.     Mouth/Throat:     Pharynx: Oropharynx is clear.  Eyes:     General: No scleral icterus.    Extraocular Movements: Extraocular movements intact.  Cardiovascular:     Rate and Rhythm: Normal rate.  Pulmonary:     Effort: Pulmonary effort is normal. No respiratory distress.  Abdominal:     General: There is no distension.     Palpations: Abdomen is soft.  Skin:    General: Skin is warm and dry.  Neurological:     General: No focal deficit present.     Mental Status: He is alert and oriented to person, place, and time.  Psychiatric:        Mood and Affect: Mood normal.        Behavior: Behavior normal.   Drain: held in place with bandage.  No suture or statlock remains.  Skin has mild erythematous changes.  There is no area of fluctuance or skin breakdown  Imaging: CT ABDOMEN PELVIS W CONTRAST  Result Date: 06/15/2022 CLINICAL DATA:  Diverticulitis, left iliopsoas abscess post drain catheter placement 04/06/2022 EXAM: CT ABDOMEN AND PELVIS WITH CONTRAST TECHNIQUE: Multidetector CT imaging of the abdomen and pelvis was performed using the standard protocol following  bolus administration of intravenous contrast. RADIATION DOSE REDUCTION: This exam was performed according to the departmental  dose-optimization program which includes automated exposure control, adjustment of the mA and/or kV according to patient size and/or use of iterative reconstruction technique. CONTRAST:  164m ISOVUE-300 IOPAMIDOL (ISOVUE-300) INJECTION 61% COMPARISON:  05/25/2022 and previous FINDINGS: Lower chest: No pleural or pericardial effusion. Visualized lung bases clear. Hepatobiliary: Partially calcified subcentimeter stones in the nondistended gallbladder. No focal liver lesion or biliary ductal dilatation. Pancreas: Unremarkable. No pancreatic ductal dilatation or surrounding inflammatory changes. Spleen: Normal in size without focal abnormality. Adrenals/Urinary Tract: No adrenal mass. No urolithiasis or hydronephrosis. Multiple cortical lesions are again seen throughout both kidneys, some of which can be characterized as cysts, stable since 04/09/2021; no follow-up recommended. Urinary bladder partially distended, unremarkable. Stomach/Bowel: Stomach physiologically distended by ingested material, unremarkable. Small bowel is nondistended. Appendix not identified. The colon is partially distended by gas and fecal material, with changes of interval sigmoid colectomy and left lower quadrant colostomy. Hartmann's pouch decompressed, unremarkable. Vascular/Lymphatic: Post aortobifem with chronic occlusion of the right limb. Patent fem-fem graft. Portal and renal veins patent. No abdominal or pelvic adenopathy localized. Reproductive: Mild prostate enlargement. Other: Left iliopsoas pigtail drain catheter has slightly retracted. There has been significant resolution of the complex iliopsoas abscess. Peripherally enhancing 1 cm psoas and 1.4 cm iliacus remnants are identified, remote from the drain catheter. No new or undrained collections. No ascites.  No free air. Musculoskeletal: Solid-appearing Instrumented fusion L3-4. Degenerative disc disease L1-L3. No fracture or worrisome bone lesion. IMPRESSION: 1. Near complete  resolution of left iliopsoas abscess post drain catheter placement. 2. Small residual left psoas and iliacus remnants. 3. Interval sigmoid colectomy and left lower quadrant colostomy. 4. Cholelithiasis. 5. chronic occlusion of the right aortobifem limb, patent fem-fem graft. 6.  Aortic Atherosclerosis (ICD10-I70.0). Electronically Signed   By: DLucrezia EuropeM.D.   On: 06/15/2022 14:35    Labs:  CBC: Recent Labs    05/31/22 0235 06/01/22 0052 06/02/22 0300 06/03/22 0227  WBC 10.5 10.4 10.7* 11.0*  HGB 12.1* 11.9* 11.4* 11.3*  HCT 37.3* 36.9* 33.8* 33.8*  PLT 540* 474* 456* 408*    COAGS: Recent Labs    05/28/22 1146 05/28/22 1614 05/29/22 1135 05/30/22 0318  APTT 88* 92* 71* 46*    BMP: Recent Labs    05/29/22 1135 05/30/22 0318 06/01/22 0052 06/02/22 0300  NA 135 140 135 135  K 3.8 4.4 4.2 4.1  CL 105 106 101 101  CO2 '23 25 28 25  '$ GLUCOSE 198* 112* 133* 170*  BUN '11 10 10 10  '$ CALCIUM 8.1* 8.4* 8.5* 8.3*  CREATININE 1.10 1.02 1.17 1.13  GFRNONAA >60 >60 >60 >60    LIVER FUNCTION TESTS: Recent Labs    05/28/22 0410 05/29/22 1135 05/30/22 0318 06/01/22 0052  BILITOT 0.8 0.7 0.3 <0.1*  AST 16 14* 14* 12*  ALT '10 11 12 11  '$ ALKPHOS 29* 31* 34* 46  PROT 5.3* 4.9* 4.8* 4.8*  ALBUMIN 2.2* 2.0* 1.8* 1.9*    Assessment:  Iliopsoas abscess -s[facility]p [facility]-clinically improving -CT shows resolution of iliopsoas collection -drain removed uneventfully in office -encouraged patient to keep appointment with surgeon tomorrow.  Signed:Pasty Spillers PA 06/15/2022, 2:47 PM   Please refer to Dr. HAdron Beneattestation of this note for management and plan.

## 2022-06-17 DIAGNOSIS — M48061 Spinal stenosis, lumbar region without neurogenic claudication: Secondary | ICD-10-CM | POA: Diagnosis not present

## 2022-06-17 DIAGNOSIS — E039 Hypothyroidism, unspecified: Secondary | ICD-10-CM | POA: Diagnosis not present

## 2022-06-17 DIAGNOSIS — I5022 Chronic systolic (congestive) heart failure: Secondary | ICD-10-CM | POA: Diagnosis not present

## 2022-06-17 DIAGNOSIS — E43 Unspecified severe protein-calorie malnutrition: Secondary | ICD-10-CM | POA: Diagnosis not present

## 2022-06-17 DIAGNOSIS — I4729 Other ventricular tachycardia: Secondary | ICD-10-CM | POA: Diagnosis not present

## 2022-06-17 DIAGNOSIS — I480 Paroxysmal atrial fibrillation: Secondary | ICD-10-CM | POA: Diagnosis not present

## 2022-06-17 DIAGNOSIS — I70219 Atherosclerosis of native arteries of extremities with intermittent claudication, unspecified extremity: Secondary | ICD-10-CM | POA: Diagnosis not present

## 2022-06-17 DIAGNOSIS — F321 Major depressive disorder, single episode, moderate: Secondary | ICD-10-CM | POA: Diagnosis not present

## 2022-06-17 DIAGNOSIS — I255 Ischemic cardiomyopathy: Secondary | ICD-10-CM | POA: Diagnosis not present

## 2022-06-17 DIAGNOSIS — E1151 Type 2 diabetes mellitus with diabetic peripheral angiopathy without gangrene: Secondary | ICD-10-CM | POA: Diagnosis not present

## 2022-06-17 DIAGNOSIS — I493 Ventricular premature depolarization: Secondary | ICD-10-CM | POA: Diagnosis not present

## 2022-06-17 DIAGNOSIS — I110 Hypertensive heart disease with heart failure: Secondary | ICD-10-CM | POA: Diagnosis not present

## 2022-06-17 DIAGNOSIS — Z483 Aftercare following surgery for neoplasm: Secondary | ICD-10-CM | POA: Diagnosis not present

## 2022-06-17 DIAGNOSIS — Z48815 Encounter for surgical aftercare following surgery on the digestive system: Secondary | ICD-10-CM | POA: Diagnosis not present

## 2022-06-17 DIAGNOSIS — J449 Chronic obstructive pulmonary disease, unspecified: Secondary | ICD-10-CM | POA: Diagnosis not present

## 2022-06-17 DIAGNOSIS — I252 Old myocardial infarction: Secondary | ICD-10-CM | POA: Diagnosis not present

## 2022-06-20 NOTE — Progress Notes (Unsigned)
Office Visit    Patient Name: [name] Date of Encounter: 06/20/2022  Primary Care Provider:  Townsend Roger, MD Primary Cardiologist:  Jenne Campus, MD Primary Electrophysiologist: None  Chief Complaint    [name] is a 77 y.o. male with PMH of CAD s/p inferior MI in 1992 with PTCA of RCA, inferior wall MI in 2000 that was treated with PCI to LAD, inferior MI in 2006 with DES to RCA and developed in-stent restenosis with repeat stenting in 2006, [place] performed 2019 that showed severe stenosis of proximal LAD that was treated with DES and RCA was found to be chronically occluded with left-to-right collaterals, ICM, tobacco abuse, HLD, HTN, NSVT paroxysmal AF (on Xarelto), DM type II who presents today for posthospital follow-up.  Past Medical History    Past Medical History:  Diagnosis Date   Atherosclerosis of native arteries of the extremities with intermittent claudication 08/13/2013   Bladder cancer [facility])    resection x3   CAD, NATIVE VESSEL 09/22/2008   Qualifier: Diagnosis of  By: Jaramillo, [place], ISCHEMIC 09/22/2008   Qualifier: Diagnosis of  By: Sidney Ace     Chronic systolic CHF (congestive heart failure) ([place])    Cigarette smoker    Coronary artery disease    status post DMI RX Taxus stent RCA 2006 with susequent Stent LAD and subsequent  stent thrombosis RCA unable to be opened 2006 -neg mv 10/2008, 10/16/17 ISR to pLAD with PTCA/DES, CTO of RCA with collaterals, EF 25%   Coronary artery disease involving native coronary artery of native heart with unstable angina pectoris ([place])    Diverticulitis of sigmoid colon 05/11/2021   HYPERLIPIDEMIA-MIXED 09/22/2008   Qualifier: Diagnosis of  By: Jaramillo, [place], BENIGN 09/22/2008   Qualifier: Diagnosis of  By: Sidney Ace     Ischemic cardiomyopathy    ejection fraction of 40-45%   Lumbar spinal stenosis    Myocardial infarction ([place])    "I've had 4" (10/16/2017)   NSVT  (nonsustained ventricular tachycardia) ([facility])    Paroxysmal atrial fibrillation ([facility])    PVC's (premature ventricular contractions)    PVD 09/22/2008   Qualifier: Diagnosis of  By: Sidney Ace     Status post coronary artery stent placement    SYSTOLIC HEART FAILURE, CHRONIC 09/22/2008   Qualifier: Diagnosis of  By: Jaramillo, [place] 05/29/2009   Qualifier: Diagnosis of  By: Earley Favor, RN, BSN, Patricia L    Type II diabetes mellitus ([place])    Unstable angina ([place]) 10/16/2017   Past Surgical History:  Procedure Laterality Date   AORTA - BILATERAL FEMORAL ARTERY BYPASS GRAFT Bilateral 1992   APPENDECTOMY     BACK SURGERY     COLECTOMY WITH COLOSTOMY CREATION/HARTMANN PROCEDURE N/A 05/26/2022   Procedure: COLECTOMY WITH COLOSTOMY CREATION/HARTMANN PROCEDURE;  Surgeon: Jesusita Oka, MD;  Location: [facility];  Service: General;  Laterality: N/A;   CORONARY ANGIOPLASTY WITH STENT PLACEMENT     taxus stent  placed into his right coronary artery; stent placed to the LAD.     CORONARY STENT INTERVENTION N/A 10/16/2017   Procedure: CORONARY STENT INTERVENTION;  Surgeon: Burnell Blanks, MD;  Location: [facility];  Service: Cardiovascular;  Laterality: N/A;   FEMORAL-FEMORAL BYPASS GRAFT  07/2009   LAPAROTOMY N/A 05/26/2022   Procedure: EXPLORATORY LAPAROTOMY;  Surgeon: Jesusita Oka, MD;  Location: [facility];  Service: General;  Laterality: N/A;   LUMBAR MICRODISCECTOMY  12/31/08   RIGHT/LEFT HEART CATH AND CORONARY ANGIOGRAPHY N/A 10/16/2017   Procedure: RIGHT/LEFT HEART CATH AND CORONARY ANGIOGRAPHY;  Surgeon: Burnell Blanks, MD;  Location: [facility];  Service: Cardiovascular;  Laterality: N/A;   SHOULDER OPEN ROTATOR CUFF REPAIR Left     Allergies  Allergies  Allergen Reactions   Lisinopril Swelling and Rash    Rash - face and tounge swell    History of Present Illness    [name]  is a 77 year old male with the above mention past medical  history who presents today for posthospital follow-up.  [name] has a complex coronary history dating back to 41 with most recent ischemic evaluation completed in 2019 with R/LHC performed showing CTO of RCA and severe stenosis at proximal LAD treated with DES.  2D echo was also completed showing EF of 25-30% and patient declined EP referral for ICD.  [facility] and treated for perforation and abscess formation after acute sigmoid diverticulitis.  He had percutaneous drains placed and CT showed resolution of abscess.  He developed acute illness and was transferred from [facility] to [place] long due to worsening drain output.  He required Lasix for volume overload on 2/3.  Surgery was offered but patient requested transfer to [facility].  Xarelto was held in anticipation of possible surgery last dose being 05/22/2022.  He had no active evidence of ischemia and surgical clearance was provided.  He underwent laparotomy with Hartmann procedure and developed chest pain postop which was sharp and pleuritic.  This improved with nitroglycerin and chest CTA showed no PE.  Xarelto was restarted and Entresto was on hold due to AKI and soft BP.  Plan was to restart Entresto and consider addition of SGLT2 and MRA in outpatient setting.  [name] presents today with his daughter for posthospital follow-up.  Since last being seen in the office patient reports that he has been feeling well with no new fevers, chest pain, or shortness of breath at rest.  He is still tired and weak from his surgeries and recently had his final drain removed by  He has been compliant with his current medications and denies any adverse reactions.  His blood pressure today is well-controlled at 122/62.  During the visit we discussed his severely reduced EF and the need to optimize GDMT.  I also advised him to consider referral to EP to discuss placement of a defibrillator.  They both agreed to think about this option  further.  Patient denies chest pain, palpitations, dyspnea, PND, orthopnea, nausea, vomiting, dizziness, syncope, edema, weight gain, or early satiety.  Home Medications    Current Outpatient Medications  Medication Sig Dispense Refill   amLODipine (NORVASC) 5 MG tablet Take 1 tablet (5 mg total) by mouth daily. 90 tablet 3   aspirin EC 81 MG tablet Take 1 tablet (81 mg total) by mouth daily. Swallow whole. 90 tablet 3   budesonide-formoterol (SYMBICORT) 160-4.5 MCG/ACT inhaler Inhale 2 puffs into the lungs 2 (two) times daily as needed for wheezing or shortness of breath.     carvedilol (COREG) 12.5 MG tablet Take 12.5 mg by mouth 2 (two) times daily with a meal.     cetirizine (ZYRTEC) 10 MG tablet Take 10 mg by mouth daily.     glipiZIDE (GLUCOTROL) 10 MG tablet Take 1 tablet (10 mg total) by mouth 2 (two) times daily. 60 tablet 6  oxyCODONE (OXY IR/ROXICODONE) 5 MG immediate release tablet Take 1 tablet (5 mg total) by mouth every 6 (six) hours as needed for severe pain (not relieved by tylenol). 20 tablet 0   rivaroxaban (XARELTO) 20 MG TABS tablet Take 1 tablet (20 mg total) by mouth daily with supper. 30 tablet 0   rosuvastatin (CRESTOR) 20 MG tablet Take 1 tablet (20 mg total) by mouth daily. 90 tablet 3   sacubitril-valsartan (ENTRESTO) 24-26 MG Take 1 tablet by mouth 2 (two) times daily. 60 tablet 0   sertraline (ZOLOFT) 50 MG tablet Take 1 tablet (50 mg total) by mouth daily. (Patient not taking: Reported on 05/24/2022) 30 tablet 2   No current facility-administered medications for this visit.     Review of Systems  Please see the history of present illness.    (+) Weakness, fatigue (+) Shortness of breath with exertion  All other systems reviewed and are otherwise negative except as noted above.  Physical Exam    Wt Readings from Last 3 Encounters:  06/10/22 148 lb 12.8 oz (67.5 kg)  05/26/22 155 lb 3.3 oz (70.4 kg)  04/22/22 157 lb 2 oz (71.3 kg)   [id number] were no  vitals filed for this visit.,There is no height or weight on file to calculate BMI.  Constitutional:      Appearance: Healthy appearance. Not in distress.  Neck:     Vascular: JVD normal.  Pulmonary:     Effort: Pulmonary effort is normal.     Breath sounds: No wheezing. No rales. Diminished in the bases Cardiovascular:     Normal rate. Regular rhythm. Normal S1. Normal S2.      Murmurs: There is no murmur.  Edema:    Peripheral edema absent.  Abdominal:     Palpations: Abdomen is soft non tender. There is no hepatomegaly.  Skin:    General: Skin is warm and dry.  Neurological:     General: No focal deficit present.     Mental Status: Alert and oriented to person, place and time.     Cranial Nerves: Cranial nerves are intact.  EKG/LABS/ Recent Cardiac Studies    ECG personally reviewed by me today -none completed today  Risk Assessment/Calculations:    CHA2DS2-VASc Score = 6   This indicates a 9.7% annual risk of stroke. The patient's score is based upon: CHF History: 1 HTN History: 1 Diabetes History: 1 Stroke History: 0 Vascular Disease History: 1 Age Score: 2 Gender Score: 0           Lab Results  Component Value Date   WBC 11.0 (H) 06/03/2022   HGB 11.3 (L) 06/03/2022   HCT 33.8 (L) 06/03/2022   MCV 86.9 06/03/2022   PLT 408 (H) 06/03/2022   Lab Results  Component Value Date   CREATININE 1.13 06/02/2022   BUN 10 06/02/2022   NA 135 06/02/2022   K 4.1 06/02/2022   CL 101 06/02/2022   CO2 25 06/02/2022   Lab Results  Component Value Date   ALT 11 06/01/2022   AST 12 (L) 06/01/2022   ALKPHOS 46 06/01/2022   BILITOT <0.1 (L) 06/01/2022   Lab Results  Component Value Date   CHOL 122 02/28/2022   HDL 35 (L) 02/28/2022   LDLCALC 66 02/28/2022   TRIG 111 02/28/2022   CHOLHDL 3.5 02/28/2022    Lab Results  Component Value Date   HGBA1C 8.5 (H) 02/28/2022    Cardiac Studies & Procedures   CARDIAC CATHETERIZATION  CARDIAC CATHETERIZATION  10/16/2017  Narrative  Prox RCA lesion is 100% stenosed.  Mid RCA to Dist RCA lesion is 100% stenosed.  Ost LM to Mid LM lesion is 30% stenosed.  Ost LAD to Prox LAD lesion is 90% stenosed.  A drug-eluting stent was successfully placed using a STENT SYNERGY DES 3X28.  Post intervention, there is a 0% residual stenosis.  LV end diastolic pressure is normal.  1. Chronic occlusion RCA with distal vessel filling from left to right collaterals. 2. Severe stenosis proximal LAD in the old stented segment (bare metal stent was placed in 2000). 3. Successful PTCA/DES x  Proximal LAD 4. Normal filling pressures  Recommendations: Will continue ASA and Plavix for one month along with Xarelto. Restart Xarelto in am. Will use ASA for only one month then will stop and continue Plavix with Xarelto for one year. Continue beta blocker and statin. Likely d/c home tomorrow am.  Findings Coronary Findings Diagnostic  Dominance: Right  Left Main Ost LM to Mid LM lesion is 30% stenosed.  Left Anterior Descending Vessel is large. Ost LAD to Prox LAD lesion is 90% stenosed. The lesion was previously treated using a bare metal stent over 2 years ago.  Right Coronary Artery Vessel is large. Prox RCA lesion is 100% stenosed. The lesion is chronically occluded. Mid RCA to Dist RCA lesion is 100% stenosed. The lesion is chronically occluded. The lesion was previously treated.  Right Posterior Atrioventricular Artery Collaterals RPAV filled by collaterals from 1st Sept.  Intervention  Ost LAD to Prox LAD lesion Stent Pre-stent angioplasty was performed using a BALLOON WOLVERINE 2.50X10. A drug-eluting stent was successfully placed using a STENT SYNERGY DES 3X28. Stent strut is well apposed. Stent overlaps previously placed stent. Post-stent angioplasty was performed using a BALLOON SAPPHIRE [place] 3.5X18. Post-Intervention Lesion Assessment The intervention was successful. Pre-interventional TIMI flow  is 3. Post-intervention TIMI flow is 3. No complications occurred at this lesion. There is a 0% residual stenosis post intervention.   STRESS TESTS  MYOCARDIAL PERFUSION IMAGING 09/28/2017  Narrative  Nuclear stress EF: 29%.  Defect 1: There is a large defect of severe severity present in the basal inferoseptal, basal inferior, mid inferoseptal, mid inferior, apical anterior, apical septal, apical inferior, apical lateral and apex location.  This is a high risk study.  Findings consistent with prior myocardial infarction.  The left ventricular ejection fraction is severely decreased (<30%).  High risk study due to a very large old scar and severely reduced left ventricular systolic function. No reversible ischemia is seen.   ECHOCARDIOGRAM  ECHOCARDIOGRAM COMPLETE 02/28/2022  Narrative ECHOCARDIOGRAM REPORT    Patient Name:   ALVE SKENANDORE Date of Exam: 02/28/2022 Medical Rec #:  [id number]     Height:       67.6 in Accession #:    [id number]    Weight:       170.4 lb Date of Birth:  1945-07-12     BSA:          1.901 m Patient Age:    31 years      BP:           126/74 mmHg Patient Gender: M             HR:           87 bpm. Exam Location:  [place]  Procedure: 2D Echo, Cardiac Doppler, Color Doppler and Strain Analysis  Indications:    Cardiomyopathy-Unspecified I42.9  History:  Patient has prior history of Echocardiogram examinations, most recent 02/09/2018. CAD, Arrythmias:PVC, Signs/Symptoms:Chest Pain and Altered Mental Status; Risk Factors:Hypertension, Dyslipidemia and Former Smoker.  Sonographer:    Luane School RDCS Referring Phys: [id number] [place]   1. Left ventricular ejection fraction, by estimation, is 60 to 65%. Left ventricular ejection fraction by PLAX is 25 %. The left ventricle has severely decreased function. The left ventricle demonstrates global hypokinesis. The left ventricular internal cavity size was mildly  dilated. Left ventricular diastolic parameters are consistent with Grade II diastolic dysfunction (pseudonormalization). Elevated left atrial pressure. The average left ventricular global longitudinal strain is -4.9 %. The global longitudinal strain is abnormal. 2. Right ventricular systolic function is mildly reduced. The right ventricular size is normal. There is normal pulmonary artery systolic pressure. 3. Left atrial size was mildly dilated. 4. The mitral valve is degenerative. Mild mitral valve regurgitation. No evidence of mitral stenosis. 5. The aortic valve is normal in structure. Aortic valve regurgitation is not visualized. No aortic stenosis is present. 6. Aortic Normal DTA. 7. The inferior vena cava is normal in size with greater than 50% respiratory variability, suggesting right atrial pressure of 3 mmHg.  FINDINGS Left Ventricle: Left ventricular ejection fraction, by estimation, is 60 to 65%. Left ventricular ejection fraction by PLAX is 25 %. The left ventricle has severely decreased function. The left ventricle demonstrates global hypokinesis. The average left ventricular global longitudinal strain is -4.9 %. The global longitudinal strain is abnormal. The left ventricular internal cavity size was mildly dilated. There is no left ventricular hypertrophy. Left ventricular diastolic parameters are consistent with Grade II diastolic dysfunction (pseudonormalization). Elevated left atrial pressure.  Right Ventricle: The right ventricular size is normal. No increase in right ventricular wall thickness. Right ventricular systolic function is mildly reduced. There is normal pulmonary artery systolic pressure. The tricuspid regurgitant velocity is 2.08 m/s, and with an assumed right atrial pressure of 3 mmHg, the estimated right ventricular systolic pressure is 0000000 mmHg.  Left [facility]: Left atrial size was mildly dilated.  Right [facility]: Right atrial size was normal in  size.  Pericardium: There is no evidence of pericardial effusion.  Mitral Valve: The mitral valve is degenerative in appearance. Mild mitral annular calcification. Mild mitral valve regurgitation. No evidence of mitral valve stenosis.  Tricuspid Valve: The tricuspid valve is normal in structure. Tricuspid valve regurgitation is trivial. No evidence of tricuspid stenosis.  Aortic Valve: The aortic valve is normal in structure. Aortic valve regurgitation is not visualized. No aortic stenosis is present.  Pulmonic Valve: The pulmonic valve was normal in structure. Pulmonic valve regurgitation is not visualized. No evidence of pulmonic stenosis.  Aorta: The aortic root and ascending aorta are structurally normal, with no evidence of dilitation, the aortic arch was not well visualized and Normal DTA.  Venous: The pulmonary veins were not well visualized. The inferior vena cava is normal in size with greater than 50% respiratory variability, suggesting right atrial pressure of 3 mmHg.  IAS/Shunts: No atrial level shunt detected by color flow Doppler.   LEFT VENTRICLE PLAX 2D LV EF:         Left            Diastology ventricular     LV e' medial:    5.18 cm/s ejection        LV E/e' medial:  21.8 fraction by     LV e' lateral:   5.25 cm/s PLAX is 25  LV E/e' lateral: 21.5 %. LVIDd:         5.90 cm         2D LVIDs:         5.20 cm         Longitudinal LV PW:         0.80 cm         Strain LV IVS:        1.10 cm         2D Strain GLS  -4.9 % LVOT diam:     2.00 cm         Avg: LV SV:         42 LV SV Index:   22 LVOT Area:     3.14 cm  LV Volumes (MOD) LV vol d, MOD    96.8 ml A2C: LV vol d, MOD    83.4 ml A4C: LV vol s, MOD    67.6 ml A2C: LV vol s, MOD    60.9 ml A4C: LV SV MOD A2C:   29.2 ml LV SV MOD A4C:   83.4 ml LV SV MOD BP:    23.9 ml  RIGHT VENTRICLE            IVC RV S prime:     9.11 cm/s  IVC diam: 1.90 cm TAPSE (M-mode): 2.4 cm  LEFT ATRIUM              Index        RIGHT ATRIUM           Index LA diam:        4.30 cm 2.26 cm/m   RA Area:     17.20 cm LA Vol (A2C):   61.1 ml 32.14 ml/m  RA Volume:   44.70 ml  23.51 ml/m LA Vol (A4C):   59.4 ml 31.24 ml/m LA Biplane Vol: 64.5 ml 33.93 ml/m AORTIC VALVE LVOT Vmax:   63.20 cm/s LVOT Vmean:  41.750 cm/s LVOT VTI:    0.133 m  AORTA Ao Root diam: 3.00 cm Ao Asc diam:  2.80 cm Ao Desc diam: 2.30 cm  MITRAL VALVE                TRICUSPID VALVE MV Area (PHT): 4.46 cm     TR Peak grad:   17.3 mmHg MV Decel Time: 170 msec     TR Vmax:        208.00 cm/s MV E velocity: 113.00 cm/s MV A velocity: 96.00 cm/s   SHUNTS MV E/A ratio:  1.18         Systemic VTI:  0.13 m Systemic Diam: 2.00 cm  Shirlee More MD Electronically signed by Shirlee More MD Signature Date/Time: 02/28/2022/3:53:02 PM    Final    MONITORS  CARDIAC EVENT MONITOR 06/01/2016  Narrative Sinus rhythm. Non-sustained ventricular tachycardia, asymptomatic at time of events. Several 3 second pauses. Premature ventricular contractions  His monitor has been reviewed over the last month due to NSVT and he has been seen in EP clinic by Dr. Rayann Heman (04/27/16 and 05/27/16). Continue beta blocker.           Assessment & Plan    1.  Combined chronic systolic diastolic CHF: -Previous 2D echo completed 02/2022 with severely reduced EF of 25% with grade 2 DD and mild MV regurgitation -We discussed the importance of optimizing GDMT and also referral to EP for discussion of possible ICD. -We will increase Entresto today to  49/51 mg twice daily -We will submit preauthorization for Jardiance/Farxiga and based on affordability patient will begin taking 10 mg daily -We will check BMET today and again in 2 weeks -Low sodium diet, fluid restriction <2L, and daily weights encouraged. Educated to contact our office for weight gain of 2 lbs overnight or 5 lbs in one week.    2.  Coronary artery disease/ICM: -Extensive history of  CAD multiple PCI's and stents placed. -Today patient reports no chest pain or anginal equivalent. -He will continue GDMT with ASA 81 mg, carvedilol 12.5 mg, Crestor 20 mg -We will add 0.4 mg nitroglycerin as needed -Patient was advised of ED precautions continue low-sodium heart healthy diet  3.  Essential hypertension: -Patient's blood pressure today is well-controlled at 122/62 -Continue Norvasc 5 mg daily, carvedilol 12.5 mg   4.  Hyperlipidemia: -Patient's most recent LDL was 42 -Continue Crestor 20 mg daily  5.  Paroxysmal atrial fibrillation: -Patient reports no evidence of palpitations or tachycardia since hospitalization -Continue carvedilol as noted above -He was advised to continue Xarelto 20 mg daily -Patient's creatinine clearance is 53 and dose is appropriate -CHA2DS2-VASc Score = 6 [CHF History: 1, HTN History: 1, Diabetes History: 1, Stroke History: 0, Vascular Disease History: 1, Age Score: 2, Gender Score: 0].  Therefore, the patient's annual risk of stroke is 9.7 %.      Disposition: Follow-up with Jenne Campus, MD or APP in 2  months    Medication Adjustments/Labs and Tests Ordered: Current medicines are reviewed at length with the patient today.  Concerns regarding medicines are outlined above.   Signed, Mable Fill, Marissa Nestle, NP 06/20/2022, 11:27 AM [facility]  Note:  This document was prepared using Dragon voice recognition software and may include unintentional dictation errors.

## 2022-06-21 ENCOUNTER — Encounter: Payer: Self-pay | Admitting: Nurse Practitioner

## 2022-06-21 ENCOUNTER — Ambulatory Visit: Payer: Medicare Other | Attending: Nurse Practitioner | Admitting: Nurse Practitioner

## 2022-06-21 VITALS — BP 122/62 | HR 88 | Ht 67.0 in | Wt 152.0 lb

## 2022-06-21 DIAGNOSIS — I480 Paroxysmal atrial fibrillation: Secondary | ICD-10-CM | POA: Diagnosis not present

## 2022-06-21 DIAGNOSIS — I5022 Chronic systolic (congestive) heart failure: Secondary | ICD-10-CM

## 2022-06-21 DIAGNOSIS — I10 Essential (primary) hypertension: Secondary | ICD-10-CM

## 2022-06-21 DIAGNOSIS — I255 Ischemic cardiomyopathy: Secondary | ICD-10-CM | POA: Diagnosis not present

## 2022-06-21 DIAGNOSIS — E785 Hyperlipidemia, unspecified: Secondary | ICD-10-CM

## 2022-06-21 MED ORDER — NITROGLYCERIN 0.4 MG SL SUBL: 0.4000 mg | SUBLINGUAL_TABLET | SUBLINGUAL | 3 refills | Status: DC | PRN

## 2022-06-21 NOTE — Patient Instructions (Addendum)
Medication Instructions:  START Nitroglycerin 0.'4mg'$  Take 1 as needed for emergency chest pain. Take first dose for emergency chest pain; WAIT 5 minutes and if still having chest pain CALL 911 and then take 2nd dose. IF still having pain wait an additional 5 minutes before taking final dose. Do not take more than 3 doses in a day. WE WILL REACH OUT TO YOUR INSURANCE TO SEE WHICH MEDICATION WILL BE AFFORDABLE JARDIANCE OR FARXIGA AND WE WILL BE IN TOUCH WITH YOU. *If you need a refill on your cardiac medications before your next appointment, please call your pharmacy*   Lab Work: TODAY-BMET 2 WEEKS- BMET If you have labs (blood work) drawn today and your tests are completely normal, you will receive your results only by: [place] (if you have MyChart) OR A paper copy in the mail If you have any lab test that is abnormal or we need to change your treatment, we will call you to review the results.   Testing/Procedures: NONE ORDERED   Follow-Up: At [facility], you and your health needs are our priority.  As part of our continuing mission to provide you with exceptional heart care, we have created designated Provider Care Teams.  These Care Teams include your primary Cardiologist (physician) and Advanced Practice Providers (APPs -  Physician Assistants and Nurse Practitioners) who all work together to provide you with the care you need, when you need it.  We recommend signing up for the patient portal called "MyChart".  Sign up information is provided on this After Visit Summary.  MyChart is used to connect with patients for Virtual Visits (Telemedicine).  Patients are able to view lab/test results, encounter notes, upcoming appointments, etc.  Non-urgent messages can be sent to your provider as well.   To learn more about what you can do with MyChart, go to [email or web address].    Your next appointment:   2 month(s)  Provider:   Jenne Campus, MD    Other  Instructions Please check your blood pressure daily for 2 weeks, then contact the office with your readings.  Please monitor blood pressures and keep a log of your readings.  Make sure to check 2 hours after your medications.  Please contact the office if you gain more than 3lbs in a day or 5lbs in a week. Limit your salt intake to 1500-'2000mg'$  per day or '500mg'$  of Sodium per meal.

## 2022-06-22 LAB — BASIC METABOLIC PANEL
BUN/Creatinine Ratio: 13 (ref 10–24)
BUN: 15 mg/dL (ref 8–27)
CO2: 23 mmol/L (ref 20–29)
Calcium: 9.9 mg/dL (ref 8.6–10.2)
Chloride: 105 mmol/L (ref 96–106)
Creatinine, Ser: 1.13 mg/dL (ref 0.76–1.27)
Glucose: 186 mg/dL — ABNORMAL HIGH (ref 70–99)
Potassium: 4.9 mmol/L (ref 3.5–5.2)
Sodium: 142 mmol/L (ref 134–144)
eGFR: 67 mL/min/{1.73_m2} (ref >59)

## 2022-06-23 DIAGNOSIS — I480 Paroxysmal atrial fibrillation: Secondary | ICD-10-CM | POA: Diagnosis not present

## 2022-06-23 DIAGNOSIS — F321 Major depressive disorder, single episode, moderate: Secondary | ICD-10-CM | POA: Diagnosis not present

## 2022-06-23 DIAGNOSIS — I70219 Atherosclerosis of native arteries of extremities with intermittent claudication, unspecified extremity: Secondary | ICD-10-CM | POA: Diagnosis not present

## 2022-06-23 DIAGNOSIS — I5022 Chronic systolic (congestive) heart failure: Secondary | ICD-10-CM | POA: Diagnosis not present

## 2022-06-23 DIAGNOSIS — I110 Hypertensive heart disease with heart failure: Secondary | ICD-10-CM | POA: Diagnosis not present

## 2022-06-23 DIAGNOSIS — E1151 Type 2 diabetes mellitus with diabetic peripheral angiopathy without gangrene: Secondary | ICD-10-CM | POA: Diagnosis not present

## 2022-06-23 DIAGNOSIS — J449 Chronic obstructive pulmonary disease, unspecified: Secondary | ICD-10-CM | POA: Diagnosis not present

## 2022-06-23 DIAGNOSIS — E039 Hypothyroidism, unspecified: Secondary | ICD-10-CM | POA: Diagnosis not present

## 2022-06-23 DIAGNOSIS — I255 Ischemic cardiomyopathy: Secondary | ICD-10-CM | POA: Diagnosis not present

## 2022-06-23 DIAGNOSIS — I4729 Other ventricular tachycardia: Secondary | ICD-10-CM | POA: Diagnosis not present

## 2022-06-23 DIAGNOSIS — M48061 Spinal stenosis, lumbar region without neurogenic claudication: Secondary | ICD-10-CM | POA: Diagnosis not present

## 2022-06-23 DIAGNOSIS — E43 Unspecified severe protein-calorie malnutrition: Secondary | ICD-10-CM | POA: Diagnosis not present

## 2022-06-23 DIAGNOSIS — Z48815 Encounter for surgical aftercare following surgery on the digestive system: Secondary | ICD-10-CM | POA: Diagnosis not present

## 2022-06-23 DIAGNOSIS — I252 Old myocardial infarction: Secondary | ICD-10-CM | POA: Diagnosis not present

## 2022-06-23 DIAGNOSIS — I493 Ventricular premature depolarization: Secondary | ICD-10-CM | POA: Diagnosis not present

## 2022-06-23 DIAGNOSIS — Z483 Aftercare following surgery for neoplasm: Secondary | ICD-10-CM | POA: Diagnosis not present

## 2022-06-24 ENCOUNTER — Telehealth: Payer: Self-pay

## 2022-06-24 DIAGNOSIS — E43 Unspecified severe protein-calorie malnutrition: Secondary | ICD-10-CM | POA: Diagnosis not present

## 2022-06-24 DIAGNOSIS — I252 Old myocardial infarction: Secondary | ICD-10-CM | POA: Diagnosis not present

## 2022-06-24 DIAGNOSIS — M48061 Spinal stenosis, lumbar region without neurogenic claudication: Secondary | ICD-10-CM | POA: Diagnosis not present

## 2022-06-24 DIAGNOSIS — Z48815 Encounter for surgical aftercare following surgery on the digestive system: Secondary | ICD-10-CM | POA: Diagnosis not present

## 2022-06-24 DIAGNOSIS — I110 Hypertensive heart disease with heart failure: Secondary | ICD-10-CM | POA: Diagnosis not present

## 2022-06-24 DIAGNOSIS — I70219 Atherosclerosis of native arteries of extremities with intermittent claudication, unspecified extremity: Secondary | ICD-10-CM | POA: Diagnosis not present

## 2022-06-24 DIAGNOSIS — I5022 Chronic systolic (congestive) heart failure: Secondary | ICD-10-CM | POA: Diagnosis not present

## 2022-06-24 DIAGNOSIS — J449 Chronic obstructive pulmonary disease, unspecified: Secondary | ICD-10-CM | POA: Diagnosis not present

## 2022-06-24 DIAGNOSIS — E039 Hypothyroidism, unspecified: Secondary | ICD-10-CM | POA: Diagnosis not present

## 2022-06-24 DIAGNOSIS — I255 Ischemic cardiomyopathy: Secondary | ICD-10-CM | POA: Diagnosis not present

## 2022-06-24 DIAGNOSIS — I480 Paroxysmal atrial fibrillation: Secondary | ICD-10-CM | POA: Diagnosis not present

## 2022-06-24 DIAGNOSIS — F321 Major depressive disorder, single episode, moderate: Secondary | ICD-10-CM | POA: Diagnosis not present

## 2022-06-24 DIAGNOSIS — Z483 Aftercare following surgery for neoplasm: Secondary | ICD-10-CM | POA: Diagnosis not present

## 2022-06-24 DIAGNOSIS — I4729 Other ventricular tachycardia: Secondary | ICD-10-CM | POA: Diagnosis not present

## 2022-06-24 DIAGNOSIS — I493 Ventricular premature depolarization: Secondary | ICD-10-CM | POA: Diagnosis not present

## 2022-06-24 DIAGNOSIS — E1151 Type 2 diabetes mellitus with diabetic peripheral angiopathy without gangrene: Secondary | ICD-10-CM | POA: Diagnosis not present

## 2022-06-24 MED ORDER — EMPAGLIFLOZIN 10 MG PO TABS: 10.0000 mg | ORAL_TABLET | Freq: Every day | ORAL | 3 refills | Status: DC

## 2022-06-24 NOTE — Telephone Encounter (Signed)
-----   Message from Marylu Lund., NP sent at 06/24/2022 10:50 AM EST ----- Regarding: RE: percert We can try Jardiance 10 mg QD Thanks, Jaquelyn Bitter ----- Message ----- From: Whitman Hero, CMA Sent: 06/24/2022  10:13 AM EST To: Marylu Lund., NP Subject: Melton Alar: percert                                    Judeen Hammans,  which rx would you like for me to send in for the patient?   Jeani Hawking also said if she calls the patients insurance they will not tell her anything over the phone but the patient can call themselves.  Tee   ----- Message ----- From: Dennie Fetters, LPN Sent: 05/23/971   9:05 AM EST To: Whitman Hero, CMA Subject: RE: percert                                    I forgot to mention, Jaquelyn Bitter can prescribe either one and I can start a PA from there and his plan will then let me know what the preferred meds is at that time. Let me know what he decides. Thanks, Jeani Hawking ----- Message ----- From: Whitman Hero, CMA Sent: 06/23/2022   4:41 PM EST To: Deliah Boston Via, LPN Subject: percert                                        Wyonia Hough,   This patient was seen in the office yesterday and Jaquelyn Bitter wanted to know if we could check to see if his insurance will cover Jardiance or Farxiga?  Please let me know!  Thanks,  [employer]

## 2022-06-29 ENCOUNTER — Telehealth: Payer: Self-pay

## 2022-06-29 DIAGNOSIS — I5022 Chronic systolic (congestive) heart failure: Secondary | ICD-10-CM

## 2022-06-29 DIAGNOSIS — I255 Ischemic cardiomyopathy: Secondary | ICD-10-CM

## 2022-06-29 DIAGNOSIS — I480 Paroxysmal atrial fibrillation: Secondary | ICD-10-CM

## 2022-06-29 NOTE — Telephone Encounter (Signed)
Pt's daughter calling stating that Ambrose Pancoast, NP increased pt's medication entresto to 49-51 mg tablets at last office visit, this was not done. Pt daughter would like a call back concerning this matter. Please address

## 2022-06-30 DIAGNOSIS — I255 Ischemic cardiomyopathy: Secondary | ICD-10-CM | POA: Diagnosis not present

## 2022-06-30 DIAGNOSIS — Z48815 Encounter for surgical aftercare following surgery on the digestive system: Secondary | ICD-10-CM | POA: Diagnosis not present

## 2022-06-30 DIAGNOSIS — E1151 Type 2 diabetes mellitus with diabetic peripheral angiopathy without gangrene: Secondary | ICD-10-CM | POA: Diagnosis not present

## 2022-06-30 DIAGNOSIS — Z483 Aftercare following surgery for neoplasm: Secondary | ICD-10-CM | POA: Diagnosis not present

## 2022-06-30 DIAGNOSIS — I480 Paroxysmal atrial fibrillation: Secondary | ICD-10-CM | POA: Diagnosis not present

## 2022-06-30 DIAGNOSIS — J449 Chronic obstructive pulmonary disease, unspecified: Secondary | ICD-10-CM | POA: Diagnosis not present

## 2022-06-30 DIAGNOSIS — E43 Unspecified severe protein-calorie malnutrition: Secondary | ICD-10-CM | POA: Diagnosis not present

## 2022-06-30 DIAGNOSIS — M48061 Spinal stenosis, lumbar region without neurogenic claudication: Secondary | ICD-10-CM | POA: Diagnosis not present

## 2022-06-30 DIAGNOSIS — I4729 Other ventricular tachycardia: Secondary | ICD-10-CM | POA: Diagnosis not present

## 2022-06-30 DIAGNOSIS — I252 Old myocardial infarction: Secondary | ICD-10-CM | POA: Diagnosis not present

## 2022-06-30 DIAGNOSIS — I70219 Atherosclerosis of native arteries of extremities with intermittent claudication, unspecified extremity: Secondary | ICD-10-CM | POA: Diagnosis not present

## 2022-06-30 DIAGNOSIS — F321 Major depressive disorder, single episode, moderate: Secondary | ICD-10-CM | POA: Diagnosis not present

## 2022-06-30 DIAGNOSIS — I110 Hypertensive heart disease with heart failure: Secondary | ICD-10-CM | POA: Diagnosis not present

## 2022-06-30 DIAGNOSIS — E039 Hypothyroidism, unspecified: Secondary | ICD-10-CM | POA: Diagnosis not present

## 2022-06-30 DIAGNOSIS — I5022 Chronic systolic (congestive) heart failure: Secondary | ICD-10-CM | POA: Diagnosis not present

## 2022-06-30 DIAGNOSIS — I493 Ventricular premature depolarization: Secondary | ICD-10-CM | POA: Diagnosis not present

## 2022-06-30 MED ORDER — ENTRESTO 49-51 MG PO TABS: 1.0000 | ORAL_TABLET | Freq: Two times a day (BID) | ORAL | 3 refills | Status: DC

## 2022-06-30 NOTE — Telephone Encounter (Addendum)
Spoke with the patients daughter who states she is calling to let us know that her dad has a few days left of Entresto and is ready for [place] to send the new rx to the pharmacy.   Per Jaquelyn Bitter note increase Entresto to 49/'51mg'$  bid.  Rx(s) sent to pharmacy electronically.  She states she knows her dad needs labs but she was not sure when. Advised her that Jaquelyn Bitter wants her dad to have a bmet after he has taken the entresto for 2 weeks.   She voiced understanding.

## 2022-07-06 DIAGNOSIS — Z483 Aftercare following surgery for neoplasm: Secondary | ICD-10-CM | POA: Diagnosis not present

## 2022-07-06 DIAGNOSIS — E1151 Type 2 diabetes mellitus with diabetic peripheral angiopathy without gangrene: Secondary | ICD-10-CM | POA: Diagnosis not present

## 2022-07-06 DIAGNOSIS — Z7901 Long term (current) use of anticoagulants: Secondary | ICD-10-CM | POA: Diagnosis not present

## 2022-07-06 DIAGNOSIS — I2510 Atherosclerotic heart disease of native coronary artery without angina pectoris: Secondary | ICD-10-CM | POA: Diagnosis not present

## 2022-07-06 DIAGNOSIS — I110 Hypertensive heart disease with heart failure: Secondary | ICD-10-CM | POA: Diagnosis not present

## 2022-07-06 DIAGNOSIS — Z48815 Encounter for surgical aftercare following surgery on the digestive system: Secondary | ICD-10-CM | POA: Diagnosis not present

## 2022-07-06 DIAGNOSIS — I5022 Chronic systolic (congestive) heart failure: Secondary | ICD-10-CM | POA: Diagnosis not present

## 2022-07-06 DIAGNOSIS — E43 Unspecified severe protein-calorie malnutrition: Secondary | ICD-10-CM | POA: Diagnosis not present

## 2022-07-06 DIAGNOSIS — Z8551 Personal history of malignant neoplasm of bladder: Secondary | ICD-10-CM | POA: Diagnosis not present

## 2022-07-06 DIAGNOSIS — M48061 Spinal stenosis, lumbar region without neurogenic claudication: Secondary | ICD-10-CM | POA: Diagnosis not present

## 2022-07-06 DIAGNOSIS — I255 Ischemic cardiomyopathy: Secondary | ICD-10-CM | POA: Diagnosis not present

## 2022-07-06 DIAGNOSIS — I70219 Atherosclerosis of native arteries of extremities with intermittent claudication, unspecified extremity: Secondary | ICD-10-CM | POA: Diagnosis not present

## 2022-07-06 DIAGNOSIS — I493 Ventricular premature depolarization: Secondary | ICD-10-CM | POA: Diagnosis not present

## 2022-07-06 DIAGNOSIS — Z7982 Long term (current) use of aspirin: Secondary | ICD-10-CM | POA: Diagnosis not present

## 2022-07-06 DIAGNOSIS — Z7951 Long term (current) use of inhaled steroids: Secondary | ICD-10-CM | POA: Diagnosis not present

## 2022-07-06 DIAGNOSIS — Z6824 Body mass index (BMI) 24.0-24.9, adult: Secondary | ICD-10-CM | POA: Diagnosis not present

## 2022-07-06 DIAGNOSIS — F321 Major depressive disorder, single episode, moderate: Secondary | ICD-10-CM | POA: Diagnosis not present

## 2022-07-06 DIAGNOSIS — E782 Mixed hyperlipidemia: Secondary | ICD-10-CM | POA: Diagnosis not present

## 2022-07-06 DIAGNOSIS — E039 Hypothyroidism, unspecified: Secondary | ICD-10-CM | POA: Diagnosis not present

## 2022-07-06 DIAGNOSIS — I4729 Other ventricular tachycardia: Secondary | ICD-10-CM | POA: Diagnosis not present

## 2022-07-06 DIAGNOSIS — I252 Old myocardial infarction: Secondary | ICD-10-CM | POA: Diagnosis not present

## 2022-07-06 DIAGNOSIS — J449 Chronic obstructive pulmonary disease, unspecified: Secondary | ICD-10-CM | POA: Diagnosis not present

## 2022-07-06 DIAGNOSIS — Z7984 Long term (current) use of oral hypoglycemic drugs: Secondary | ICD-10-CM | POA: Diagnosis not present

## 2022-07-06 DIAGNOSIS — I480 Paroxysmal atrial fibrillation: Secondary | ICD-10-CM | POA: Diagnosis not present

## 2022-07-07 DIAGNOSIS — E1151 Type 2 diabetes mellitus with diabetic peripheral angiopathy without gangrene: Secondary | ICD-10-CM | POA: Diagnosis not present

## 2022-07-07 DIAGNOSIS — I110 Hypertensive heart disease with heart failure: Secondary | ICD-10-CM | POA: Diagnosis not present

## 2022-07-07 DIAGNOSIS — M48061 Spinal stenosis, lumbar region without neurogenic claudication: Secondary | ICD-10-CM | POA: Diagnosis not present

## 2022-07-07 DIAGNOSIS — E43 Unspecified severe protein-calorie malnutrition: Secondary | ICD-10-CM | POA: Diagnosis not present

## 2022-07-07 DIAGNOSIS — I255 Ischemic cardiomyopathy: Secondary | ICD-10-CM | POA: Diagnosis not present

## 2022-07-07 DIAGNOSIS — I493 Ventricular premature depolarization: Secondary | ICD-10-CM | POA: Diagnosis not present

## 2022-07-07 DIAGNOSIS — I252 Old myocardial infarction: Secondary | ICD-10-CM | POA: Diagnosis not present

## 2022-07-07 DIAGNOSIS — F321 Major depressive disorder, single episode, moderate: Secondary | ICD-10-CM | POA: Diagnosis not present

## 2022-07-07 DIAGNOSIS — I5022 Chronic systolic (congestive) heart failure: Secondary | ICD-10-CM | POA: Diagnosis not present

## 2022-07-07 DIAGNOSIS — E039 Hypothyroidism, unspecified: Secondary | ICD-10-CM | POA: Diagnosis not present

## 2022-07-07 DIAGNOSIS — J449 Chronic obstructive pulmonary disease, unspecified: Secondary | ICD-10-CM | POA: Diagnosis not present

## 2022-07-07 DIAGNOSIS — Z483 Aftercare following surgery for neoplasm: Secondary | ICD-10-CM | POA: Diagnosis not present

## 2022-07-07 DIAGNOSIS — Z48815 Encounter for surgical aftercare following surgery on the digestive system: Secondary | ICD-10-CM | POA: Diagnosis not present

## 2022-07-07 DIAGNOSIS — I4729 Other ventricular tachycardia: Secondary | ICD-10-CM | POA: Diagnosis not present

## 2022-07-07 DIAGNOSIS — I480 Paroxysmal atrial fibrillation: Secondary | ICD-10-CM | POA: Diagnosis not present

## 2022-07-07 DIAGNOSIS — I70219 Atherosclerosis of native arteries of extremities with intermittent claudication, unspecified extremity: Secondary | ICD-10-CM | POA: Diagnosis not present

## 2022-07-12 ENCOUNTER — Other Ambulatory Visit: Payer: Self-pay

## 2022-07-12 MED ORDER — GLUCOSE BLOOD VI STRP: ORAL_STRIP | 12 refills | Status: DC

## 2022-07-14 DIAGNOSIS — I493 Ventricular premature depolarization: Secondary | ICD-10-CM | POA: Diagnosis not present

## 2022-07-14 DIAGNOSIS — I5022 Chronic systolic (congestive) heart failure: Secondary | ICD-10-CM | POA: Diagnosis not present

## 2022-07-14 DIAGNOSIS — M48061 Spinal stenosis, lumbar region without neurogenic claudication: Secondary | ICD-10-CM | POA: Diagnosis not present

## 2022-07-14 DIAGNOSIS — I255 Ischemic cardiomyopathy: Secondary | ICD-10-CM | POA: Diagnosis not present

## 2022-07-14 DIAGNOSIS — I110 Hypertensive heart disease with heart failure: Secondary | ICD-10-CM | POA: Diagnosis not present

## 2022-07-14 DIAGNOSIS — I252 Old myocardial infarction: Secondary | ICD-10-CM | POA: Diagnosis not present

## 2022-07-14 DIAGNOSIS — I70219 Atherosclerosis of native arteries of extremities with intermittent claudication, unspecified extremity: Secondary | ICD-10-CM | POA: Diagnosis not present

## 2022-07-14 DIAGNOSIS — E1151 Type 2 diabetes mellitus with diabetic peripheral angiopathy without gangrene: Secondary | ICD-10-CM | POA: Diagnosis not present

## 2022-07-14 DIAGNOSIS — E43 Unspecified severe protein-calorie malnutrition: Secondary | ICD-10-CM | POA: Diagnosis not present

## 2022-07-14 DIAGNOSIS — Z48815 Encounter for surgical aftercare following surgery on the digestive system: Secondary | ICD-10-CM | POA: Diagnosis not present

## 2022-07-14 DIAGNOSIS — J449 Chronic obstructive pulmonary disease, unspecified: Secondary | ICD-10-CM | POA: Diagnosis not present

## 2022-07-14 DIAGNOSIS — E039 Hypothyroidism, unspecified: Secondary | ICD-10-CM | POA: Diagnosis not present

## 2022-07-14 DIAGNOSIS — Z483 Aftercare following surgery for neoplasm: Secondary | ICD-10-CM | POA: Diagnosis not present

## 2022-07-14 DIAGNOSIS — F321 Major depressive disorder, single episode, moderate: Secondary | ICD-10-CM | POA: Diagnosis not present

## 2022-07-14 DIAGNOSIS — I4729 Other ventricular tachycardia: Secondary | ICD-10-CM | POA: Diagnosis not present

## 2022-07-14 DIAGNOSIS — I480 Paroxysmal atrial fibrillation: Secondary | ICD-10-CM | POA: Diagnosis not present

## 2022-07-15 ENCOUNTER — Telehealth: Payer: Self-pay | Admitting: Nurse Practitioner

## 2022-07-15 DIAGNOSIS — Z79899 Other long term (current) drug therapy: Secondary | ICD-10-CM

## 2022-07-15 DIAGNOSIS — I5022 Chronic systolic (congestive) heart failure: Secondary | ICD-10-CM

## 2022-07-15 NOTE — Telephone Encounter (Signed)
Left message for patient's daughter Crystal to callback.

## 2022-07-15 NOTE — Telephone Encounter (Signed)
Patient's daughter called and said that they have not received the orders yet. Patient saw Ambrose Pancoast on 06/21/22 and said he was supposed to put orders in for patient. Please call back

## 2022-07-15 NOTE — Telephone Encounter (Signed)
Incoming call transferred to triage.  Crystal states patient has not had BMET drawn following increase in [place] dose from last office visit with Ambrose Pancoast, NP on 06/21/22. She states patient will go to [place] to have this drawn.  Placed new order for BMET and released to [place]. Patient will have labs drawn next week, possibly 07/19/22.  Advised Crystal to callback if any further issues, she verbalized understanding and expressed appreciation for call.

## 2022-07-21 DIAGNOSIS — I480 Paroxysmal atrial fibrillation: Secondary | ICD-10-CM | POA: Diagnosis not present

## 2022-07-21 DIAGNOSIS — I252 Old myocardial infarction: Secondary | ICD-10-CM | POA: Diagnosis not present

## 2022-07-21 DIAGNOSIS — F321 Major depressive disorder, single episode, moderate: Secondary | ICD-10-CM | POA: Diagnosis not present

## 2022-07-21 DIAGNOSIS — I5022 Chronic systolic (congestive) heart failure: Secondary | ICD-10-CM | POA: Diagnosis not present

## 2022-07-21 DIAGNOSIS — J449 Chronic obstructive pulmonary disease, unspecified: Secondary | ICD-10-CM | POA: Diagnosis not present

## 2022-07-21 DIAGNOSIS — I255 Ischemic cardiomyopathy: Secondary | ICD-10-CM | POA: Diagnosis not present

## 2022-07-21 DIAGNOSIS — E43 Unspecified severe protein-calorie malnutrition: Secondary | ICD-10-CM | POA: Diagnosis not present

## 2022-07-21 DIAGNOSIS — I4729 Other ventricular tachycardia: Secondary | ICD-10-CM | POA: Diagnosis not present

## 2022-07-21 DIAGNOSIS — I70219 Atherosclerosis of native arteries of extremities with intermittent claudication, unspecified extremity: Secondary | ICD-10-CM | POA: Diagnosis not present

## 2022-07-21 DIAGNOSIS — E1151 Type 2 diabetes mellitus with diabetic peripheral angiopathy without gangrene: Secondary | ICD-10-CM | POA: Diagnosis not present

## 2022-07-21 DIAGNOSIS — Z48815 Encounter for surgical aftercare following surgery on the digestive system: Secondary | ICD-10-CM | POA: Diagnosis not present

## 2022-07-21 DIAGNOSIS — Z483 Aftercare following surgery for neoplasm: Secondary | ICD-10-CM | POA: Diagnosis not present

## 2022-07-21 DIAGNOSIS — I110 Hypertensive heart disease with heart failure: Secondary | ICD-10-CM | POA: Diagnosis not present

## 2022-07-21 DIAGNOSIS — E039 Hypothyroidism, unspecified: Secondary | ICD-10-CM | POA: Diagnosis not present

## 2022-07-21 DIAGNOSIS — I493 Ventricular premature depolarization: Secondary | ICD-10-CM | POA: Diagnosis not present

## 2022-07-21 DIAGNOSIS — M48061 Spinal stenosis, lumbar region without neurogenic claudication: Secondary | ICD-10-CM | POA: Diagnosis not present

## 2022-07-22 ENCOUNTER — Ambulatory Visit: Payer: Medicare Other | Admitting: Internal Medicine

## 2022-07-22 ENCOUNTER — Encounter: Payer: Self-pay | Admitting: Internal Medicine

## 2022-07-22 VITALS — BP 122/64 | HR 88 | Temp 97.6°F | Resp 18 | Ht 67.0 in | Wt 165.4 lb

## 2022-07-22 DIAGNOSIS — I480 Paroxysmal atrial fibrillation: Secondary | ICD-10-CM | POA: Diagnosis not present

## 2022-07-22 DIAGNOSIS — I10 Essential (primary) hypertension: Secondary | ICD-10-CM

## 2022-07-22 DIAGNOSIS — M25511 Pain in right shoulder: Secondary | ICD-10-CM

## 2022-07-22 DIAGNOSIS — I5042 Chronic combined systolic (congestive) and diastolic (congestive) heart failure: Secondary | ICD-10-CM | POA: Insufficient documentation

## 2022-07-22 DIAGNOSIS — E119 Type 2 diabetes mellitus without complications: Secondary | ICD-10-CM | POA: Insufficient documentation

## 2022-07-22 DIAGNOSIS — I2510 Atherosclerotic heart disease of native coronary artery without angina pectoris: Secondary | ICD-10-CM

## 2022-07-22 DIAGNOSIS — I5022 Chronic systolic (congestive) heart failure: Secondary | ICD-10-CM | POA: Insufficient documentation

## 2022-07-22 DIAGNOSIS — E1159 Type 2 diabetes mellitus with other circulatory complications: Secondary | ICD-10-CM | POA: Diagnosis not present

## 2022-07-22 DIAGNOSIS — I25110 Atherosclerotic heart disease of native coronary artery with unstable angina pectoris: Secondary | ICD-10-CM

## 2022-07-22 DIAGNOSIS — M25512 Pain in left shoulder: Secondary | ICD-10-CM

## 2022-07-22 HISTORY — DX: Pain in right shoulder: M25.511

## 2022-07-22 HISTORY — DX: Essential (primary) hypertension: I10

## 2022-07-22 HISTORY — DX: Type 2 diabetes mellitus with other circulatory complications: E11.59

## 2022-07-22 HISTORY — DX: Chronic combined systolic (congestive) and diastolic (congestive) heart failure: I50.42

## 2022-07-22 NOTE — Assessment & Plan Note (Signed)
His rate seems controlled at this time.  I told him he needs to take his xarelto daily.

## 2022-07-22 NOTE — Assessment & Plan Note (Signed)
He denies any angina today.  He has bruising over his right biceps and this is due to his ASA and xarelto.  I have reviewed his medications.  We will continue risk factor modfication.

## 2022-07-22 NOTE — Assessment & Plan Note (Signed)
He has pain of both shoulders.  We will get a xray and if this is negative, he may have bursitis or tendonitis.  We may refer to ortho.

## 2022-07-22 NOTE — Progress Notes (Signed)
Office Visit  Subjective   Patient ID: [name]   DOB: Jul 11, 1945   Age: 77 y.o.   MRN: [id number]   Chief Complaint Chief Complaint  Patient presents with   Follow-up    HTN & T2D Shoulder pain and swelling Bumped on door and bruised yesterday     History of Present Illness [name] is a 77 yo male who returns today for followup of his complicated diverticulitis where he was admitted to [facility] from 05/24/2022 until 06/03/2022 from diverticulitis and abscess formation.  He had a CT scan of abd/plevis done which showed diverticulitis of sigmoid with adjacent abscess and iliopsoas abscess.  He was admitted to [facility] from 04/08/2022 until 04/14/2022 for this where he underwent a CT guided left pelvic abscess drain on 04/08/2022.  Cultures grew out E coli.  The patient was placed on IV antibiotics and was transitioned to oral augmentin at discharge.  He was also noted to have a large central disc extrusion of the L1-L2 level with moderate to severe spinal canal stenosis on CT scan of his abd/pelvis.  He was discharged home on augmentin and flagyl with directions to followup with general surgery as an outpatient.  The patient did followup with general surgery and had a repeat CT scan on 05/12/2022 which showed an increase in size of his abscess where he was referred to the ER for continued care.  There was concern on the CT scan for a fistula between the abscess and colon.  He was admitted back to [facility] on 05/19/2022 and underwent removal of his drain with placement of a new percutaneous drain.  He was started on broad spectrum antibiotics.  General surgery recommended colon resection and colostomy formation.  Again, there was evidence of a possible fistulous tract from the site of the diveticulitis to the abscess cavity.  Of note, while he was at [facility], he has acute renal failure where his creatinine peaked to 1.6.  He received IVF's and this resolved.  They did stop him temporarily on entresto due to his  renal failure where he has chronic systolic CHF with severely reduced EF.  He needed lasix for volume overload on 2/3 at [facility].  He became euvolemic and they continued him on Entresto, Aldactone, beta-blocker as per cardiology.   The patient was transferred to [facility] for second opion where he was admitted there on 05/24/2022. He underwent exploratory  laparotomy with Hartman's procedure for complicated diverticulitis with coloatmospheric Fistula by Dr Bedelia Person 05/26/2022.  His penrose drain was removed on 06/02/2022.  They did a referral for skilled nursing facility placement but that was denied by his insurance.  He states his surgical site is healing and homehealth wound nursing is coming to assess this weekly.     The patient is a 77 year old Caucasian/White male who returns for a follow-up visit for his T2 diabetes. He was diagnosed with T2 diabetes in the 1990's.  Since the last visit, there have been no problems. He remains on glipizide 10 mg BID and Jardiance 10mg  daily.  He was recently placed on jardiance by his cardiologist due to his heart disease.  He is not walking as much as they would like.[name] He specifically denies unexplained fatigue, palpitations, unexplained abdominal pain, nausea or vomiting or hypoglycemia. He does check blood sugars daily where they range from 100-150 with some elevated ones that are intermittent.  He came in fasting today in anticipation of lab work. He stopped metformin in  the past due to diarrhea.  He has no complications of diabetic retinopathy, neuropathy, or nephropathy.  He does have PVD and CAD associated with his diabetes.     This is a 77 year old Caucasian/White male who has mild atrial fibrillation of many years known duration and presents today for a status visit. He remains rate controlled on coreg 12.5mg  BID and continues on Xarelto 20mg  every other day.  He remains on ASA 81mg  daily.  He last saw cardiology on 06/21/2022 where they noted his CHAD2VASC score was a 6.   Specifically denied complaints: palpitations, chest pain, weakness, syncope and TIAs.   The patient is a 77 year old Caucasian/White male who presents for a follow-up evaluation of hypertension.   Since his last visit, there has been no problems.  The patient has been checking his blood pressure at home.  His BP diary shows his SBYP run 120-140's.  The patient's current medications include: coreg 12.5mg  BID, amlodipine 5mg  daily, and entresto 49/51mg  BID.  The patient has been tolerating his medications well. The patient denies any dizziness, lightheadness, chest pain, and shortness of breath. He reports there have been no other symptoms noted.   This patient also has moderately severe CAD of about years known duration and presents today for a status visit. He had an inferior MI in 1992 with PTCA of RCA.  He had an inferior wall MI in 2000 that was treated with PCI to LAD.  He also had an inferior MI in 2006 with DES to RCA and developed in-stent restenosis with repeat stenting in 2006.  He had a myocardial perfusion scan done on 09/2017 that showed an LVEF of 29%.  There was a large defect of severe severity present in the basal inferoseptal, basal inferior, mid inferoseptal, mid inferior, apical anterior, apical septal, apical inferior, apical lateral and apex location.  This was a high risk study and there were findings  consistent with prior myocardial infarction. It was a high risk study due to a very large old scar and severely reduced left ventricular systolic function. No reversible ischemia was seen. A LHC was performed 2019 that showed severe stenosis of proximal LAD that was treated with DES and RCA was found to be chronically occluded with left-to-right collaterals.  His last visit with cardiology was in 06/21/2022 where they felt his CAD was stable and he was not having any angina.  See PMH for summary of cardiac history and status of revascularization. His CAD is controlled with therapy as summarized  in the medication list and previous notes. The patient has no comorbid conditions. He has the following baseline symptoms: exertional dyspnea. He has the following modifiable risk factor(s): HTN, DM, hyperlipidemia, and sedentary lifestyle. Specifically denied complaint(s): chest pain, palpitations, orthopnea, edema, exertional dyspnea, and syncope. He quit smoking in 02/2021.  The patient also has a history of combined chronic systolic and diastolic CHF of years duration and presents for a regular status visit. The etiology of the CHF is secondary to ischemic cardiomyopathy related to his CAD as described above.  His CHF has been in a compensated state on medications as noted in the medication list. He has the following baseline symptoms: exertional dyspnea. He states his baseline is that he can go up an entire flight of 10 stairs before he gets DOE. Specifically denied complaints: orthopnea, PND, edema, worsening orthopnea, worsening edema, and worsening exertional dyspnea.  His last ECHO was done in 02/28/2022 that showed a LVEF 60-65%. Left ventricular  ejection fraction by PLAX is 25 %. The left ventricle has severely decreased function. The left ventricle demonstrates global hypokinesis. The left ventricular internal cavity size was mildly dilated. Left ventricular diastolic parameters are consistent with Grade II diastolic dysfunction (pseudonormalization).  His right ventricular systolic function is mildly reduced. The right ventricular size is normal. There was normal pulmonary artery systolic pressure.  3. Left atrial size was mildly dilated.  The mitral valve is degenerative. Mild mitral valve regurgitation. No evidence of mitral stenosis.  Cardiology saw him on 06/21/2022 where they increased his dose of entresto to 49/51mg  BID.  His BP has been doing well but he never went back to cardiology to recheck his BMP after going up on his dose of entresto.  They discussed possible referral to EP for discussion  of possible ICD.  They also placed him on Jardiance  daily.    He is also having some bilateral shoulder pain in his shoulders that started while he was in the hospital.  He hit his right arm on a car door last week where he has a large hematoma of his biceps of his right arm.  The patient decided to take xarelto every other day.     Past Medical History Past Medical History:  Diagnosis Date   Atherosclerosis of native arteries of the extremities with intermittent claudication 08/13/2013   Bladder cancer    resection x3   CAD, NATIVE VESSEL 09/22/2008   Qualifier: Diagnosis of  By: Vikki Ports     CARDIOMYOPATHY, ISCHEMIC 09/22/2008   Qualifier: Diagnosis of  By: Vikki Ports     Chronic systolic CHF (congestive heart failure)    Cigarette smoker    Coronary artery disease    status post DMI RX Taxus stent RCA 2006 with susequent Stent LAD and subsequent  stent thrombosis RCA unable to be opened 2006 -neg mv 10/2008, 10/16/17 ISR to pLAD with PTCA/DES, CTO of RCA with collaterals, EF 25%   Coronary artery disease involving native coronary artery of native heart with unstable angina pectoris    Diverticulitis of sigmoid colon 05/11/2021   HYPERLIPIDEMIA-MIXED 09/22/2008   Qualifier: Diagnosis of  By: Vikki Ports     HYPERTENSION, BENIGN 09/22/2008   Qualifier: Diagnosis of  By: Vikki Ports     Ischemic cardiomyopathy    ejection fraction of 40-45%   Lumbar spinal stenosis    Myocardial infarction    "I've had 4" (10/16/2017)   NSVT (nonsustained ventricular tachycardia)    Paroxysmal atrial fibrillation    PVC's (premature ventricular contractions)    PVD 09/22/2008   Qualifier: Diagnosis of  By: Vikki Ports     Status post coronary artery stent placement    SYSTOLIC HEART FAILURE, CHRONIC 09/22/2008   Qualifier: Diagnosis of  By: Vikki Ports     TOBACCO ABUSE 05/29/2009   Qualifier: Diagnosis of  By: Meryl Crutch RN, BSN, Patricia L    Type II diabetes mellitus    Unstable  angina 10/16/2017     Allergies Allergies  Allergen Reactions   Lisinopril Swelling and Rash    Rash - face and tounge swell     Medications  Current Outpatient Medications:    glucose blood test strip, Use as instructed, Disp: 100 each, Rfl: 12   amLODipine (NORVASC) 5 MG tablet, Take 1 tablet (5 mg total) by mouth daily., Disp: 90 tablet, Rfl: 3   aspirin EC 81 MG tablet, Take 1 tablet (81 mg total) by mouth daily. Swallow whole., Disp:  90 tablet, Rfl: 3   budesonide-formoterol (SYMBICORT) 160-4.5 MCG/ACT inhaler, Inhale 2 puffs into the lungs 2 (two) times daily as needed for wheezing or shortness of breath., Disp: , Rfl:    carvedilol (COREG) 12.5 MG tablet, Take 12.5 mg by mouth 2 (two) times daily with a meal., Disp: , Rfl:    cetirizine (ZYRTEC) 10 MG tablet, Take 10 mg by mouth daily., Disp: , Rfl:    empagliflozin (JARDIANCE) 10 MG TABS tablet, Take 1 tablet (10 mg total) by mouth daily before breakfast., Disp: 30 tablet, Rfl: 3   glipiZIDE (GLUCOTROL) 10 MG tablet, Take 1 tablet (10 mg total) by mouth 2 (two) times daily., Disp: 60 tablet, Rfl: 6   nitroGLYCERIN (NITROSTAT) 0.4 MG SL tablet, Place 1 tablet (0.4 mg total) under the tongue every 5 (five) minutes as needed for chest pain., Disp: 90 tablet, Rfl: 3   oxyCODONE (OXY IR/ROXICODONE) 5 MG immediate release tablet, Take 1 tablet (5 mg total) by mouth every 6 (six) hours as needed for severe pain (not relieved by tylenol)., Disp: 20 tablet, Rfl: 0   rivaroxaban (XARELTO) 20 MG TABS tablet, Take 1 tablet (20 mg total) by mouth daily with supper., Disp: 30 tablet, Rfl: 0   rosuvastatin (CRESTOR) 20 MG tablet, Take 1 tablet (20 mg total) by mouth daily., Disp: 90 tablet, Rfl: 3   sacubitril-valsartan (ENTRESTO) 49-51 MG, Take 1 tablet by mouth 2 (two) times daily., Disp: 60 tablet, Rfl: 3   Review of Systems Review of Systems  Constitutional:  Negative for chills, fever, malaise/fatigue and weight loss.  Eyes:  Negative for  blurred vision and double vision.  Respiratory:  Negative for cough, sputum production, shortness of breath and wheezing.   Cardiovascular:  Negative for chest pain, palpitations and leg swelling.  Gastrointestinal:  Negative for abdominal pain, blood in stool, constipation, diarrhea, heartburn, melena, nausea and vomiting.  Genitourinary:  Negative for hematuria.  Musculoskeletal:  Negative for myalgias.  Skin:  Negative for itching and rash.  Neurological:  Negative for dizziness, weakness and headaches.  Psychiatric/Behavioral:  Negative for depression. The patient is not nervous/anxious.        Objective:    Vitals BP 122/64 (BP Location: Left Arm, Patient Position: Sitting, Cuff Size: Normal)   Pulse 88   Temp 97.6 F (36.4 C) (Temporal)   Resp 18   Ht 5\' 7"  (1.702 m)   Wt 165 lb 6.4 oz (75 kg)   SpO2 96%   BMI 25.91 kg/m    Physical Examination Physical Exam Constitutional:      Appearance: Normal appearance. He is not ill-appearing.  Cardiovascular:     Rate and Rhythm: Normal rate and regular rhythm.     Pulses: Normal pulses.     Heart sounds: No murmur heard.    No friction rub. No gallop.  Pulmonary:     Effort: Pulmonary effort is normal. No respiratory distress.     Breath sounds: No wheezing, rhonchi or rales.  Abdominal:     General: Abdomen is flat. Bowel sounds are normal. There is no distension.     Palpations: Abdomen is soft.     Tenderness: There is no abdominal tenderness.  Musculoskeletal:     Right lower leg: No edema.     Left lower leg: No edema.  Skin:    General: Skin is warm and dry.     Findings: No rash.  Neurological:     General: No focal deficit present.  Mental Status: He is alert and oriented to person, place, and time.  Psychiatric:        Mood and Affect: Mood normal.        Behavior: Behavior normal.        Assessment & Plan:   Coronary artery disease involving native coronary artery of native heart with unstable  angina pectoris (HCC) He denies any angina today.  He has bruising over his right biceps and this is due to his ASA and xarelto.  I have reviewed his medications.  We will continue risk factor modfication.  Chronic combined systolic (congestive) and diastolic (congestive) heart failure He has chronic combined systolic and diastolic CHF.  There is no evidence of volume overload today.  They did increase his entresto and we will repeat a BMP today.  His BP is stable and doing well.  I had a discussion with him to consider an AICD placement.  Essential hypertension His BP is doing well.  I did medication reconcilation with him today.  Continue with his current meds.  Type 2 diabetes mellitus with other circulatory complications He has T2 diabetes associated with CAD and PVD.  We will check a HgBA1c on him today with goal <7%.  Acute pain of both shoulders He has pain of both shoulders.  We will get a xray and if this is negative, he may have bursitis or tendonitis.  We may refer to ortho.  PAF (paroxysmal atrial fibrillation) (HCC) His rate seems controlled at this time.  I told him he needs to take his xarelto daily.      Return in about 3 months (around 10/21/2022) for annual.   Crist FatJason Van Eyk, MD

## 2022-07-22 NOTE — Assessment & Plan Note (Addendum)
He has chronic combined systolic and diastolic CHF.  There is no evidence of volume overload today.  They did increase his entresto and we will repeat a BMP today.  His BP is stable and doing well.  I had a discussion with him to consider an AICD placement.

## 2022-07-22 NOTE — Assessment & Plan Note (Signed)
He has T2 diabetes associated with CAD and PVD.  We will check a HgBA1c on him today with goal <7%.

## 2022-07-22 NOTE — Assessment & Plan Note (Signed)
His BP is doing well.  I did medication reconcilation with him today.  Continue with his current meds.

## 2022-07-23 LAB — CMP14 + ANION GAP
ALT: 7 IU/L (ref 0–44)
AST: 16 IU/L (ref 0–40)
Albumin/Globulin Ratio: 1.5 (ref 1.2–2.2)
Albumin: 4 g/dL (ref 3.8–4.8)
Alkaline Phosphatase: 82 IU/L (ref 44–121)
Anion Gap: 16 mmol/L (ref 10.0–18.0)
BUN/Creatinine Ratio: 12 (ref 10–24)
BUN: 14 mg/dL (ref 8–27)
Bilirubin Total: 0.4 mg/dL (ref 0.0–1.2)
CO2: 22 mmol/L (ref 20–29)
Calcium: 9 mg/dL (ref 8.6–10.2)
Chloride: 105 mmol/L (ref 96–106)
Creatinine, Ser: 1.18 mg/dL (ref 0.76–1.27)
Globulin, Total: 2.7 g/dL (ref 1.5–4.5)
Glucose: 287 mg/dL — ABNORMAL HIGH (ref 70–99)
Potassium: 5.1 mmol/L (ref 3.5–5.2)
Sodium: 143 mmol/L (ref 134–144)
Total Protein: 6.7 g/dL (ref 6.0–8.5)
eGFR: 64 mL/min/{1.73_m2} (ref >59)

## 2022-07-23 LAB — CBC WITH DIFFERENTIAL/PLATELET
Basophils Absolute: 0 10*3/uL (ref 0.0–0.2)
Basos: 0 %
EOS (ABSOLUTE): 0.1 10*3/uL (ref 0.0–0.4)
Eos: 2 %
Hematocrit: 37.1 % — ABNORMAL LOW (ref 37.5–51.0)
Hemoglobin: 12.3 g/dL — ABNORMAL LOW (ref 13.0–17.7)
Immature Grans (Abs): 0 10*3/uL (ref 0.0–0.1)
Immature Granulocytes: 0 %
Lymphocytes Absolute: 1.5 10*3/uL (ref 0.7–3.1)
Lymphs: 21 %
MCH: 30 pg (ref 26.6–33.0)
MCHC: 33.2 g/dL (ref 31.5–35.7)
MCV: 91 fL (ref 79–97)
Monocytes Absolute: 0.4 10*3/uL (ref 0.1–0.9)
Monocytes: 6 %
Neutrophils Absolute: 5.1 10*3/uL (ref 1.4–7.0)
Neutrophils: 71 %
Platelets: 224 10*3/uL (ref 150–450)
RBC: 4.1 x10E6/uL — ABNORMAL LOW (ref 4.14–5.80)
RDW: 14.3 % (ref 11.6–15.4)
WBC: 7.3 10*3/uL (ref 3.4–10.8)

## 2022-07-23 LAB — HEMOGLOBIN A1C
Est. average glucose Bld gHb Est-mCnc: 166 mg/dL
Hgb A1c MFr Bld: 7.4 % — ABNORMAL HIGH (ref 4.8–5.6)

## 2022-07-25 DIAGNOSIS — M19011 Primary osteoarthritis, right shoulder: Secondary | ICD-10-CM | POA: Diagnosis not present

## 2022-07-25 DIAGNOSIS — M25512 Pain in left shoulder: Secondary | ICD-10-CM | POA: Diagnosis not present

## 2022-07-25 DIAGNOSIS — M25511 Pain in right shoulder: Secondary | ICD-10-CM | POA: Diagnosis not present

## 2022-07-25 DIAGNOSIS — M19012 Primary osteoarthritis, left shoulder: Secondary | ICD-10-CM | POA: Diagnosis not present

## 2022-07-26 DIAGNOSIS — M25511 Pain in right shoulder: Secondary | ICD-10-CM | POA: Diagnosis not present

## 2022-07-28 ENCOUNTER — Telehealth: Payer: Self-pay | Admitting: *Deleted

## 2022-07-28 ENCOUNTER — Other Ambulatory Visit: Payer: Self-pay

## 2022-07-28 DIAGNOSIS — I252 Old myocardial infarction: Secondary | ICD-10-CM | POA: Diagnosis not present

## 2022-07-28 DIAGNOSIS — J449 Chronic obstructive pulmonary disease, unspecified: Secondary | ICD-10-CM | POA: Diagnosis not present

## 2022-07-28 DIAGNOSIS — I480 Paroxysmal atrial fibrillation: Secondary | ICD-10-CM | POA: Diagnosis not present

## 2022-07-28 DIAGNOSIS — I493 Ventricular premature depolarization: Secondary | ICD-10-CM | POA: Diagnosis not present

## 2022-07-28 DIAGNOSIS — E1151 Type 2 diabetes mellitus with diabetic peripheral angiopathy without gangrene: Secondary | ICD-10-CM | POA: Diagnosis not present

## 2022-07-28 DIAGNOSIS — Z48815 Encounter for surgical aftercare following surgery on the digestive system: Secondary | ICD-10-CM | POA: Diagnosis not present

## 2022-07-28 DIAGNOSIS — E43 Unspecified severe protein-calorie malnutrition: Secondary | ICD-10-CM | POA: Diagnosis not present

## 2022-07-28 DIAGNOSIS — F321 Major depressive disorder, single episode, moderate: Secondary | ICD-10-CM | POA: Diagnosis not present

## 2022-07-28 DIAGNOSIS — I70219 Atherosclerosis of native arteries of extremities with intermittent claudication, unspecified extremity: Secondary | ICD-10-CM | POA: Diagnosis not present

## 2022-07-28 DIAGNOSIS — Z483 Aftercare following surgery for neoplasm: Secondary | ICD-10-CM | POA: Diagnosis not present

## 2022-07-28 DIAGNOSIS — E039 Hypothyroidism, unspecified: Secondary | ICD-10-CM | POA: Diagnosis not present

## 2022-07-28 DIAGNOSIS — I110 Hypertensive heart disease with heart failure: Secondary | ICD-10-CM | POA: Diagnosis not present

## 2022-07-28 DIAGNOSIS — I5022 Chronic systolic (congestive) heart failure: Secondary | ICD-10-CM | POA: Diagnosis not present

## 2022-07-28 DIAGNOSIS — I255 Ischemic cardiomyopathy: Secondary | ICD-10-CM | POA: Diagnosis not present

## 2022-07-28 DIAGNOSIS — I4729 Other ventricular tachycardia: Secondary | ICD-10-CM | POA: Diagnosis not present

## 2022-07-28 DIAGNOSIS — M48061 Spinal stenosis, lumbar region without neurogenic claudication: Secondary | ICD-10-CM | POA: Diagnosis not present

## 2022-07-28 MED ORDER — EMPAGLIFLOZIN 25 MG PO TABS: 25.0000 mg | ORAL_TABLET | Freq: Every day | ORAL | 1 refills | Status: DC

## 2022-07-28 NOTE — Telephone Encounter (Signed)
        Patient  visited [facility] on 07/26/2022  for treatment    Telephone encounter attempt :  1st  A HIPAA compliant voice message was left requesting a return call.  Instructed patient to call back at [phone].  Dennis Mao Greenauer -[facility] [place], Population Health [phone] [address] [place] , [place] [address] Email : [email or web address]

## 2022-07-29 ENCOUNTER — Telehealth: Payer: Self-pay | Admitting: *Deleted

## 2022-07-29 ENCOUNTER — Other Ambulatory Visit (HOSPITAL_COMMUNITY): Payer: Self-pay | Admitting: Surgery

## 2022-07-29 DIAGNOSIS — Z933 Colostomy status: Secondary | ICD-10-CM

## 2022-07-29 NOTE — Telephone Encounter (Signed)
        Patient  visited [facility] on 07/26/2022  for treatment    Telephone encounter attempt :  2nd  A HIPAA compliant voice message was left requesting a return call.  Instructed patient to call back at [phone]. Yehuda Mao Greenauer -[facility] [place], Population Health [phone] [address] [place] , [place] [address] Email : [email or web address]

## 2022-08-01 DIAGNOSIS — Z483 Aftercare following surgery for neoplasm: Secondary | ICD-10-CM | POA: Diagnosis not present

## 2022-08-01 DIAGNOSIS — Z48815 Encounter for surgical aftercare following surgery on the digestive system: Secondary | ICD-10-CM | POA: Diagnosis not present

## 2022-08-01 DIAGNOSIS — I110 Hypertensive heart disease with heart failure: Secondary | ICD-10-CM | POA: Diagnosis not present

## 2022-08-01 DIAGNOSIS — J449 Chronic obstructive pulmonary disease, unspecified: Secondary | ICD-10-CM | POA: Diagnosis not present

## 2022-08-01 DIAGNOSIS — I70219 Atherosclerosis of native arteries of extremities with intermittent claudication, unspecified extremity: Secondary | ICD-10-CM | POA: Diagnosis not present

## 2022-08-01 DIAGNOSIS — I493 Ventricular premature depolarization: Secondary | ICD-10-CM | POA: Diagnosis not present

## 2022-08-01 DIAGNOSIS — E43 Unspecified severe protein-calorie malnutrition: Secondary | ICD-10-CM | POA: Diagnosis not present

## 2022-08-01 DIAGNOSIS — I252 Old myocardial infarction: Secondary | ICD-10-CM | POA: Diagnosis not present

## 2022-08-01 DIAGNOSIS — I4729 Other ventricular tachycardia: Secondary | ICD-10-CM | POA: Diagnosis not present

## 2022-08-01 DIAGNOSIS — I5022 Chronic systolic (congestive) heart failure: Secondary | ICD-10-CM | POA: Diagnosis not present

## 2022-08-01 DIAGNOSIS — I255 Ischemic cardiomyopathy: Secondary | ICD-10-CM | POA: Diagnosis not present

## 2022-08-01 DIAGNOSIS — E1151 Type 2 diabetes mellitus with diabetic peripheral angiopathy without gangrene: Secondary | ICD-10-CM | POA: Diagnosis not present

## 2022-08-01 DIAGNOSIS — I480 Paroxysmal atrial fibrillation: Secondary | ICD-10-CM | POA: Diagnosis not present

## 2022-08-01 DIAGNOSIS — M48061 Spinal stenosis, lumbar region without neurogenic claudication: Secondary | ICD-10-CM | POA: Diagnosis not present

## 2022-08-01 DIAGNOSIS — E039 Hypothyroidism, unspecified: Secondary | ICD-10-CM | POA: Diagnosis not present

## 2022-08-01 DIAGNOSIS — F321 Major depressive disorder, single episode, moderate: Secondary | ICD-10-CM | POA: Diagnosis not present

## 2022-08-02 DIAGNOSIS — M12811 Other specific arthropathies, not elsewhere classified, right shoulder: Secondary | ICD-10-CM | POA: Diagnosis not present

## 2022-08-02 DIAGNOSIS — M75101 Unspecified rotator cuff tear or rupture of right shoulder, not specified as traumatic: Secondary | ICD-10-CM | POA: Diagnosis not present

## 2022-08-13 ENCOUNTER — Other Ambulatory Visit: Payer: Self-pay | Admitting: Cardiovascular Disease

## 2022-08-30 ENCOUNTER — Ambulatory Visit (HOSPITAL_COMMUNITY)
Admission: RE | Admit: 2022-08-30 | Discharge: 2022-08-30 | Disposition: A | Discharge status: Home / Self Care | Payer: Medicare Other | Source: Ambulatory Visit | Attending: Surgery | Admitting: Surgery

## 2022-08-30 DIAGNOSIS — Z933 Colostomy status: Secondary | ICD-10-CM | POA: Diagnosis not present

## 2022-08-30 DIAGNOSIS — K6389 Other specified diseases of intestine: Secondary | ICD-10-CM | POA: Diagnosis not present

## 2022-08-30 MED ORDER — IOHEXOL 300 MG/ML  SOLN
100.0000 mL | Freq: Once | INTRAMUSCULAR | Status: AC | PRN
  Administered 2022-08-30: 25 mL

## 2022-08-31 DIAGNOSIS — M25411 Effusion, right shoulder: Secondary | ICD-10-CM | POA: Diagnosis not present

## 2022-08-31 DIAGNOSIS — M25511 Pain in right shoulder: Secondary | ICD-10-CM | POA: Diagnosis not present

## 2022-08-31 DIAGNOSIS — Z79899 Other long term (current) drug therapy: Secondary | ICD-10-CM | POA: Diagnosis not present

## 2022-09-02 ENCOUNTER — Ambulatory Visit: Payer: Medicare Other | Admitting: Internal Medicine

## 2022-09-02 ENCOUNTER — Encounter: Payer: Self-pay | Admitting: Internal Medicine

## 2022-09-02 VITALS — BP 128/88 | HR 82 | Temp 98.2°F | Resp 16 | Ht 67.0 in | Wt 166.2 lb

## 2022-09-02 DIAGNOSIS — M25511 Pain in right shoulder: Secondary | ICD-10-CM | POA: Diagnosis not present

## 2022-09-02 DIAGNOSIS — G8929 Other chronic pain: Secondary | ICD-10-CM

## 2022-09-02 HISTORY — DX: Other chronic pain: G89.29

## 2022-09-02 NOTE — Progress Notes (Signed)
Office Visit  Subjective   Patient ID: [name]   DOB: 21-May-1945   Age: 78 y.o.   MRN: [id number]   Chief Complaint Chief Complaint  Patient presents with   Follow-up    ER     History of Present Illness [name] is a 77 yo male who comes in today for an ER followup where he was seen at [facility] ER on 08/31/2022 with right shoulder pain.  He has a known history of bilateral shoulder pain in his shoulders that started while he was in the hospital with his admission for diverticulitis.  He hit his right arm on a car door prior to that hospitailzation where he has a large hematoma of his biceps of his right arm.  The patient decided to take xarelto every other day after that.  I saw him on 07/22/2022 and wanted to obtain shoulder xrays which were done on 07/25/2022 and his right shoulder showed soft tissue calcification adjacent to the humeral head suggestive of calcific tendiopathy and he had AC joint and glenohumeral degenerative changes.  His left shoulder xray showed irregularity of the distal clavicle most consistent with remote traumatic change with widening of the [facility] joint and glenohumeral degenerative change.  We did refer him to Dr. [name] (I do not have his notes) where the patient tells me he had a kenalog injection in his right shoulder.  He had this injection about a week before he presented to the ER on 08/31/2022 where he had continued complaints of right shoulder pain and swelling.  They did repeat xrays of his shoulder and did a CT scan of his right shoulder that showed soft tissue swelling which was suggestive of a hematoma and an infectious etiology could not be ruled out.  He had a minimal ESR elevation but normal CRP and no white count.  They contacted Dr. [name] and he recommended a CT scan which showed a shoulder effusion and possible myositis vs. Intramuscular hemorrhage.  He recommended a steriod taper and analgesics as needed and to followup with him and myself in the clinic.  He  recommended him to get a MRI of his shoulder.  He states his pain has worsened since his ER visit.  Today, he states he never did start the prednisone pack and he wanted to talk to me before he started this.  He does have an appointment with ortho on 09/06/2022.     Past Medical History Past Medical History:  Diagnosis Date   Atherosclerosis of native arteries of the extremities with intermittent claudication 08/13/2013   Bladder cancer [facility])    resection x3   CAD, NATIVE VESSEL 09/22/2008   Qualifier: Diagnosis of  By: Vikki Ports     CARDIOMYOPATHY, ISCHEMIC 09/22/2008   Qualifier: Diagnosis of  By: Vikki Ports     Chronic systolic CHF (congestive heart failure) (HCC)    Cigarette smoker    Coronary artery disease    status post DMI RX Taxus stent RCA 2006 with susequent Stent LAD and subsequent  stent thrombosis RCA unable to be opened 2006 -neg mv 10/2008, 10/16/17 ISR to pLAD with PTCA/DES, CTO of RCA with collaterals, EF 25%   Coronary artery disease involving native coronary artery of native heart with unstable angina pectoris (HCC)    Diverticulitis of sigmoid colon 05/11/2021   HYPERLIPIDEMIA-MIXED 09/22/2008   Qualifier: Diagnosis of  By: Vikki Ports     HYPERTENSION, BENIGN 09/22/2008   Qualifier: Diagnosis of  By: Marga Melnick  Luz     Ischemic cardiomyopathy    ejection fraction of 40-45%   Lumbar spinal stenosis    Myocardial infarction (HCC)    "I've had 4" (10/16/2017)   NSVT (nonsustained ventricular tachycardia) (HCC)    Paroxysmal atrial fibrillation (HCC)    PVC's (premature ventricular contractions)    PVD 09/22/2008   Qualifier: Diagnosis of  By: Vikki Ports     Status post coronary artery stent placement    SYSTOLIC HEART FAILURE, CHRONIC 09/22/2008   Qualifier: Diagnosis of  By: Vikki Ports     TOBACCO ABUSE 05/29/2009   Qualifier: Diagnosis of  By: Meryl Crutch RN, BSN, Patricia L    Type II diabetes mellitus (HCC)    Unstable angina (HCC) 10/16/2017      Allergies Allergies  Allergen Reactions   Lisinopril Swelling and Rash    Rash - face and tounge swell     Medications  Current Outpatient Medications:    glucose blood test strip, Use as instructed, Disp: 100 each, Rfl: 12   amLODipine (NORVASC) 5 MG tablet, Take 1 tablet (5 mg total) by mouth daily., Disp: 90 tablet, Rfl: 3   aspirin EC 81 MG tablet, Take 1 tablet (81 mg total) by mouth daily. Swallow whole., Disp: 90 tablet, Rfl: 3   budesonide-formoterol (SYMBICORT) 160-4.5 MCG/ACT inhaler, Inhale 2 puffs into the lungs 2 (two) times daily as needed for wheezing or shortness of breath., Disp: , Rfl:    carvedilol (COREG) 12.5 MG tablet, Take 12.5 mg by mouth 2 (two) times daily with a meal., Disp: , Rfl:    cetirizine (ZYRTEC) 10 MG tablet, Take 10 mg by mouth daily., Disp: , Rfl:    empagliflozin (JARDIANCE) 25 MG TABS tablet, Take 1 tablet (25 mg total) by mouth daily before breakfast., Disp: 90 tablet, Rfl: 1   glipiZIDE (GLUCOTROL) 10 MG tablet, Take 1 tablet (10 mg total) by mouth 2 (two) times daily., Disp: 60 tablet, Rfl: 6   nitroGLYCERIN (NITROSTAT) 0.4 MG SL tablet, Place 1 tablet (0.4 mg total) under the tongue every 5 (five) minutes as needed for chest pain., Disp: 90 tablet, Rfl: 3   oxyCODONE (OXY IR/ROXICODONE) 5 MG immediate release tablet, Take 1 tablet (5 mg total) by mouth every 6 (six) hours as needed for severe pain (not relieved by tylenol)., Disp: 20 tablet, Rfl: 0   rivaroxaban (XARELTO) 20 MG TABS tablet, Take 1 tablet (20 mg total) by mouth daily with supper., Disp: 30 tablet, Rfl: 0   rosuvastatin (CRESTOR) 20 MG tablet, Take 1 tablet (20 mg total) by mouth daily., Disp: 90 tablet, Rfl: 1   sacubitril-valsartan (ENTRESTO) 49-51 MG, Take 1 tablet by mouth 2 (two) times daily., Disp: 60 tablet, Rfl: 3   Review of Systems Review of Systems  Constitutional:  Negative for chills and fever.  Respiratory:  Negative for shortness of breath.   Cardiovascular:   Negative for chest pain, palpitations and leg swelling.  Gastrointestinal:  Positive for nausea. Negative for abdominal pain, constipation, diarrhea and vomiting.  Musculoskeletal:  Positive for joint pain. Negative for myalgias.  Neurological:  Negative for dizziness, weakness and headaches.       Objective:    Vitals BP 128/88   Pulse 82   Temp 98.2 F (36.8 C)   Resp 16   Ht 5\' 7"  (1.702 m)   Wt 166 lb 3.2 oz (75.4 kg)   SpO2 98%   BMI 26.03 kg/m    Physical Examination Physical Exam  Constitutional:      Appearance: Normal appearance. He is not ill-appearing.  Cardiovascular:     Rate and Rhythm: Normal rate and regular rhythm.     Pulses: Normal pulses.     Heart sounds: No murmur heard.    No friction rub. No gallop.  Pulmonary:     Effort: Pulmonary effort is normal. No respiratory distress.     Breath sounds: No wheezing, rhonchi or rales.  Abdominal:     General: Abdomen is flat. Bowel sounds are normal. There is no distension.     Palpations: Abdomen is soft.     Tenderness: There is no abdominal tenderness.  Musculoskeletal:     Right lower leg: No edema.     Left lower leg: No edema.     Comments: He has swelling of his right shoulder but no erythema.  There is a large bruise located on his right biceps.  Skin:    General: Skin is warm and dry.     Findings: No rash.  Neurological:     Mental Status: He is alert.        Assessment & Plan:   Chronic right shoulder pain The patient is on a blood thinner where he may have a hemearthrosis on my exam.  We will obtain a MRI of his right shoulder and I told him to take his prednisone as directed.  He can take oxycodone prn.  He will followup with ortho next week.    No follow-ups on file.   Crist Fat, MD

## 2022-09-02 NOTE — Assessment & Plan Note (Signed)
The patient is on a blood thinner where he may have a hemearthrosis on my exam.  We will obtain a MRI of his right shoulder and I told him to take his prednisone as directed.  He can take oxycodone prn.  He will followup with ortho next week.

## 2022-09-06 ENCOUNTER — Telehealth: Payer: Self-pay

## 2022-09-06 ENCOUNTER — Ambulatory Visit: Payer: Medicare Other | Attending: Cardiology | Admitting: Cardiology

## 2022-09-06 ENCOUNTER — Other Ambulatory Visit: Payer: Self-pay | Admitting: Cardiology

## 2022-09-06 ENCOUNTER — Encounter: Payer: Self-pay | Admitting: Cardiology

## 2022-09-06 VITALS — BP 108/62 | HR 85 | Ht 67.0 in | Wt 167.2 lb

## 2022-09-06 DIAGNOSIS — I10 Essential (primary) hypertension: Secondary | ICD-10-CM | POA: Diagnosis not present

## 2022-09-06 DIAGNOSIS — I5042 Chronic combined systolic (congestive) and diastolic (congestive) heart failure: Secondary | ICD-10-CM

## 2022-09-06 DIAGNOSIS — I25110 Atherosclerotic heart disease of native coronary artery with unstable angina pectoris: Secondary | ICD-10-CM | POA: Diagnosis not present

## 2022-09-06 DIAGNOSIS — R0609 Other forms of dyspnea: Secondary | ICD-10-CM

## 2022-09-06 DIAGNOSIS — I480 Paroxysmal atrial fibrillation: Secondary | ICD-10-CM

## 2022-09-06 DIAGNOSIS — M12811 Other specific arthropathies, not elsewhere classified, right shoulder: Secondary | ICD-10-CM | POA: Diagnosis not present

## 2022-09-06 DIAGNOSIS — Z955 Presence of coronary angioplasty implant and graft: Secondary | ICD-10-CM

## 2022-09-06 DIAGNOSIS — M75101 Unspecified rotator cuff tear or rupture of right shoulder, not specified as traumatic: Secondary | ICD-10-CM | POA: Diagnosis not present

## 2022-09-06 MED ORDER — ENTRESTO 49-51 MG PO TABS: 1.0000 | ORAL_TABLET | Freq: Two times a day (BID) | ORAL | 0 refills | Status: DC

## 2022-09-06 NOTE — Progress Notes (Unsigned)
Cardiology Office Note:    Date:  09/06/2022   ID:  [name], DOB [birth year], MRN [id number]  PCP:  Crist Fat, MD  Cardiologist:  Gypsy Balsam, MD    Referring MD: Leonia Reader, Barbara Cower, MD   Chief Complaint  Patient presents with   Medication Management    Can't afford Xarelto, [place] and Entresto    History of Present Illness:    [name] is a 77 y.o. male minimally complex past medical history.  Briefly 1992 he did have inferior wall myocardial infarction with PTCA of RCA then in 2000 he was treated with PTCA to LAD, not actual MI in 2006 with drug-eluting stent to RCA secondary to in-stent restenosis, cardiac catheterization done in 2019 showing severe stenosis of proximal LAD was treated with drug-eluting stent and RCA he was found to have completely occluded with left-to-right collateralization, he also had history of essential hypertension, nonsustained ventricular tachycardia, paroxysmal atrial fibrillation anticoagulant Xarelto, diabetes.  Last estimation of his left ventricle ejection fraction was from November 2023 when he was found to have ejection fraction 20%, he did have conversation about defibrillator however he declined referral at that time.  Also recently [facility] because of perforation of the sigmoid diverticular lightest.  Initially drain was placed and surgery with resolution of abscess, eventually he ended up having laparotomy done to develop chest pain postop however no STEMI, on non-STEMI. Comes today to my office for follow-up.  Overall he have to admit looks pretty good.  Denies have any chest pain tightness squeezing pressure mentions some shortness of breath but otherwise seems to be doing quite well.  Past Medical History:  Diagnosis Date   Atherosclerosis of native arteries of the extremities with intermittent claudication 08/13/2013   Bladder cancer [facility])    resection x3   CAD, NATIVE VESSEL 09/22/2008   Qualifier: Diagnosis  of  By: Vikki Ports     CARDIOMYOPATHY, ISCHEMIC 09/22/2008   Qualifier: Diagnosis of  By: Vikki Ports     Chronic systolic CHF (congestive heart failure) (HCC)    Cigarette smoker    Coronary artery disease    status post DMI RX Taxus stent RCA 2006 with susequent Stent LAD and subsequent  stent thrombosis RCA unable to be opened 2006 -neg mv 10/2008, 10/16/17 ISR to pLAD with PTCA/DES, CTO of RCA with collaterals, EF 25%   Coronary artery disease involving native coronary artery of native heart with unstable angina pectoris (HCC)    Diverticulitis of sigmoid colon 05/11/2021   HYPERLIPIDEMIA-MIXED 09/22/2008   Qualifier: Diagnosis of  By: Vikki Ports     HYPERTENSION, BENIGN 09/22/2008   Qualifier: Diagnosis of  By: Vikki Ports     Ischemic cardiomyopathy    ejection fraction of 40-45%   Lumbar spinal stenosis    Myocardial infarction (HCC)    "I've had 4" (10/16/2017)   NSVT (nonsustained ventricular tachycardia) (HCC)    Paroxysmal atrial fibrillation (HCC)    PVC's (premature ventricular contractions)    PVD 09/22/2008   Qualifier: Diagnosis of  By: Vikki Ports     Status post coronary artery stent placement    SYSTOLIC HEART FAILURE, CHRONIC 09/22/2008   Qualifier: Diagnosis of  By: Vikki Ports     TOBACCO ABUSE 05/29/2009   Qualifier: Diagnosis of  By: Meryl Crutch RN, BSN, Patricia L    Type II diabetes mellitus (HCC)    Unstable angina (HCC) 10/16/2017    Past Surgical History:  Procedure  Laterality Date   AORTA - BILATERAL FEMORAL ARTERY BYPASS GRAFT Bilateral 1992   APPENDECTOMY     BACK SURGERY     COLECTOMY WITH COLOSTOMY CREATION/HARTMANN PROCEDURE N/A 05/26/2022   Procedure: COLECTOMY WITH COLOSTOMY CREATION/HARTMANN PROCEDURE;  Surgeon: Diamantina Monks, MD;  Location: [facility] OR;  Service: General;  Laterality: N/A;   CORONARY ANGIOPLASTY WITH STENT PLACEMENT     taxus stent  placed into his right coronary artery; stent placed to the LAD.     CORONARY STENT INTERVENTION  N/A 10/16/2017   Procedure: CORONARY STENT INTERVENTION;  Surgeon: Kathleene Hazel, MD;  Location: [facility] INVASIVE CV LAB;  Service: Cardiovascular;  Laterality: N/A;   FEMORAL-FEMORAL BYPASS GRAFT  07/2009   LAPAROTOMY N/A 05/26/2022   Procedure: EXPLORATORY LAPAROTOMY;  Surgeon: Diamantina Monks, MD;  Location: [facility] OR;  Service: General;  Laterality: N/A;   LUMBAR MICRODISCECTOMY  12/31/08   RIGHT/LEFT HEART CATH AND CORONARY ANGIOGRAPHY N/A 10/16/2017   Procedure: RIGHT/LEFT HEART CATH AND CORONARY ANGIOGRAPHY;  Surgeon: Kathleene Hazel, MD;  Location: [facility] INVASIVE CV LAB;  Service: Cardiovascular;  Laterality: N/A;   SHOULDER OPEN ROTATOR CUFF REPAIR Left     Current Medications: Current Meds  Medication Sig   amLODipine (NORVASC) 5 MG tablet Take 1 tablet (5 mg total) by mouth daily.   aspirin EC 81 MG tablet Take 1 tablet (81 mg total) by mouth daily. Swallow whole.   carvedilol (COREG) 12.5 MG tablet Take 12.5 mg by mouth 2 (two) times daily with a meal.   cetirizine (ZYRTEC) 10 MG tablet Take 10 mg by mouth daily.   empagliflozin (JARDIANCE) 25 MG TABS tablet Take 1 tablet (25 mg total) by mouth daily before breakfast.   glipiZIDE (GLUCOTROL) 10 MG tablet Take 1 tablet (10 mg total) by mouth 2 (two) times daily.   nitroGLYCERIN (NITROSTAT) 0.4 MG SL tablet Place 1 tablet (0.4 mg total) under the tongue every 5 (five) minutes as needed for chest pain.   rivaroxaban (XARELTO) 20 MG TABS tablet Take 1 tablet (20 mg total) by mouth daily with supper.   rosuvastatin (CRESTOR) 20 MG tablet Take 1 tablet (20 mg total) by mouth daily.   sacubitril-valsartan (ENTRESTO) 49-51 MG Take 1 tablet by mouth 2 (two) times daily.   sacubitril-valsartan (ENTRESTO) 49-51 MG Take 1 tablet by mouth 2 (two) times daily.   [DISCONTINUED] budesonide-formoterol (SYMBICORT) 160-4.5 MCG/ACT inhaler Inhale 2 puffs into the lungs 2 (two) times daily as needed for wheezing or shortness of breath.    [DISCONTINUED] glucose blood test strip Use as instructed (Patient taking differently: 1 each by Other route as needed for other (see below). Use as instructed)   [DISCONTINUED] oxyCODONE (OXY IR/ROXICODONE) 5 MG immediate release tablet Take 1 tablet (5 mg total) by mouth every 6 (six) hours as needed for severe pain (not relieved by tylenol).     Allergies:   Lisinopril   Social History   Socioeconomic History   Marital status: Divorced    Spouse name: Not on file   Number of children: 1   Years of education: Not on file   Highest education level: Not on file  Occupational History   Occupation: retired    Associate Professor: RETIRED    Comment: truck driver  Tobacco Use   Smoking status: Former    Packs/day: 0.50    Years: 56.00    Additional pack years: 0.00    Total pack years: 28.00    Types: Cigars, Cigarettes  Quit date: 2022    Years since quitting: 2.3   Smokeless tobacco: Former    Types: Associate Professor Use: Never used  Substance and Sexual Activity   Alcohol use: Yes    Comment: 10/16/2017 "1 beer q 2-3 months"   Drug use: Never   Sexual activity: Not Currently  Other Topics Concern   Not on file  Social History Narrative   Lives with girlfriend in [place].     Social Determinants of Health   Financial Resource Strain: Not on file  Food Insecurity: No Food Insecurity (05/24/2022)   Hunger Vital Sign    Worried About Running Out of Food in the Last Year: Never true    Ran Out of Food in the Last Year: Never true  Transportation Needs: No Transportation Needs (05/24/2022)   PRAPARE - Administrator, Civil Service (Medical): No    Lack of Transportation (Non-Medical): No  Physical Activity: Not on file  Stress: Not on file  Social Connections: Not on file     Family History: The patient's family history includes Cardiomyopathy in his mother; Coronary artery disease in his father and unknown relative; Diabetes in his father, sister, and  unknown relative; Heart attack in his father and sister; Heart disease in his father, mother, and sister; Hyperlipidemia in his father, mother, and sister; Stroke in his father. ROS:   Please see the history of present illness.    All 14 point review of systems negative except as described per history of present illness  EKGs/Labs/Other Studies Reviewed:      Recent Labs: 05/24/2022: TSH 6.794 06/01/2022: Magnesium 1.9 07/22/2022: ALT 7; BUN 14; Creatinine, Ser 1.18; Hemoglobin 12.3; Platelets 224; Potassium 5.1; Sodium 143  Recent Lipid Panel    Component Value Date/Time   CHOL 122 02/28/2022 1037   TRIG 111 02/28/2022 1037   HDL 35 (L) 02/28/2022 1037   CHOLHDL 3.5 02/28/2022 1037   CHOLHDL 6.1 CALC 11/07/2006 0958   VLDL 20 11/07/2006 0958   LDLCALC 66 02/28/2022 1037    Physical Exam:    VS:  BP 108/62 (BP Location: Left Arm, Patient Position: Sitting)   Pulse 85   Ht 5\' 7"  (1.702 m)   Wt 167 lb 3.2 oz (75.8 kg)   SpO2 95%   BMI 26.19 kg/m     Wt Readings from Last 3 Encounters:  09/06/22 167 lb 3.2 oz (75.8 kg)  09/02/22 166 lb 3.2 oz (75.4 kg)  07/22/22 165 lb 6.4 oz (75 kg)     GEN:  Well nourished, well developed in no acute distress HEENT: Normal NECK: No JVD; No carotid bruits LYMPHATICS: No lymphadenopathy CARDIAC: RRR, no murmurs, no rubs, no gallops RESPIRATORY:  Clear to auscultation without rales, wheezing or rhonchi  ABDOMEN: Soft, non-tender, non-distended MUSCULOSKELETAL:  No edema; No deformity  SKIN: Warm and dry LOWER EXTREMITIES: no swelling NEUROLOGIC:  Alert and oriented x 3 PSYCHIATRIC:  Normal affect   ASSESSMENT:    1. Primary hypertension   2. Dyspnea on exertion   3. Chronic combined systolic (congestive) and diastolic (congestive) heart failure (HCC)   4. Coronary artery disease involving native coronary artery of native heart with unstable angina pectoris (HCC)   5. PAF (paroxysmal atrial fibrillation) (HCC)   6. Status post  coronary artery stent placement    PLAN:    In order of problems listed above:  Cardiomyopathy ischemic in origin will repeat echocardiogram to recheck left ventricle  ejection fraction, he is on guideline directed medical therapy which include carvedilol, Entresto 49-51 as well as Jardiance which I will continue.  Will check Chem-7 today to see if we can augment his medical therapy. Paroxysmal atrial fibrillation maintained sinus rhythm on Xarelto which I will continue Chem-7 will be checked make sure the dose is appropriate. Essential hypertension blood pressure actually on the lower side we will continue monitoring. Dyslipidemia he is on Crestor 20 which is high intense statin I did review K PN which show me his LDL 66 HDL 35 this is from November 2023. Overall I have to be he looks quite good.  Will recheck his left ventricle ejection fraction to help him get to in terms of potential ICD implantation.  It look like he may require shoulder surgery, required repeated abdominal surgery.  Will wait again for results of echocardiogram before final advice on that   Medication Adjustments/Labs and Tests Ordered: Current medicines are reviewed at length with the patient today.  Concerns regarding medicines are outlined above.  Orders Placed This Encounter  Procedures   Basic metabolic panel   Pro b natriuretic peptide (BNP)   ECHOCARDIOGRAM COMPLETE   Medication changes:  Meds ordered this encounter  Medications   sacubitril-valsartan (ENTRESTO) 49-51 MG    Sig: Take 1 tablet by mouth 2 (two) times daily.    Dispense:  56 tablet    Refill:  0    Order Specific Question:   Lot Number?    Answer:   [id number]    [id number] Exp Jul 25    Order Specific Question:   Expiration Date?    Answer:   12/16/2023    Signed, Georgeanna Lea, MD, [facility] 09/06/2022 12:30 PM    [facility]

## 2022-09-06 NOTE — Patient Instructions (Signed)
Medication Instructions:  Your physician recommends that you continue on your current medications as directed. Please refer to the Current Medication list given to you today.  *If you need a refill on your cardiac medications before your next appointment, please call your pharmacy*   Lab Work: BMP, ProBNP- today If you have labs (blood work) drawn today and your tests are completely normal, you will receive your results only by: MyChart Message (if you have MyChart) OR A paper copy in the mail If you have any lab test that is abnormal or we need to change your treatment, we will call you to review the results.   Testing/Procedures: Your physician has requested that you have an echocardiogram. Echocardiography is a painless test that uses sound waves to create images of your heart. It provides your doctor with information about the size and shape of your heart and how well your heart's chambers and valves are working. This procedure takes approximately one hour. There are no restrictions for this procedure. Please do NOT wear cologne, perfume, aftershave, or lotions (deodorant is allowed). Please arrive 15 minutes prior to your appointment time.    Follow-Up: At [facility], you and your health needs are our priority.  As part of our continuing mission to provide you with exceptional heart care, we have created designated Provider Care Teams.  These Care Teams include your primary Cardiologist (physician) and Advanced Practice Providers (APPs -  Physician Assistants and Nurse Practitioners) who all work together to provide you with the care you need, when you need it.  We recommend signing up for the patient portal called "MyChart".  Sign up information is provided on this After Visit Summary.  MyChart is used to connect with patients for Virtual Visits (Telemedicine).  Patients are able to view lab/test results, encounter notes, upcoming appointments, etc.  Non-urgent messages can be sent to  your provider as well.   To learn more about what you can do with MyChart, go to [email or web address].    Your next appointment:   1 month(s)  The format for your next appointment:   In Person  Provider:   Gypsy Balsam, MD    Other Instructions NA

## 2022-09-06 NOTE — Telephone Encounter (Signed)
   [facility] Pre-operative Risk Assessment    Request for surgical clearance:  What type of surgery is being performed? Right Total Shoulder Replacement    When is this surgery scheduled? TBD   What type of clearance is required (medical clearance vs. Pharmacy clearance to hold med vs. Both)? Both  Are there any medications that need to be held prior to surgery and how long?Mot specified   Practice name and name of physician performing surgery? [facility]    What is your office phone number: [phone]    7.   What is your office fax number: [phone]  8.   Anesthesia type (None, local, MAC, general) General Anesthesia    [name] [name] 09/06/2022, 5:22 PM  _________________________________________________________________   (provider comments below)

## 2022-09-07 DIAGNOSIS — R9431 Abnormal electrocardiogram [ECG] [EKG]: Secondary | ICD-10-CM | POA: Diagnosis not present

## 2022-09-07 DIAGNOSIS — I700 Atherosclerosis of aorta: Secondary | ICD-10-CM | POA: Diagnosis not present

## 2022-09-07 DIAGNOSIS — Z79899 Other long term (current) drug therapy: Secondary | ICD-10-CM | POA: Diagnosis not present

## 2022-09-07 DIAGNOSIS — E559 Vitamin D deficiency, unspecified: Secondary | ICD-10-CM | POA: Diagnosis not present

## 2022-09-07 DIAGNOSIS — M79609 Pain in unspecified limb: Secondary | ICD-10-CM | POA: Diagnosis not present

## 2022-09-07 DIAGNOSIS — Z01818 Encounter for other preprocedural examination: Secondary | ICD-10-CM | POA: Diagnosis not present

## 2022-09-07 LAB — BASIC METABOLIC PANEL
BUN/Creatinine Ratio: 16 (ref 10–24)
BUN: 25 mg/dL (ref 8–27)
CO2: 21 mmol/L (ref 20–29)
Calcium: 9.5 mg/dL (ref 8.6–10.2)
Chloride: 101 mmol/L (ref 96–106)
Creatinine, Ser: 1.59 mg/dL — ABNORMAL HIGH (ref 0.76–1.27)
Glucose: 299 mg/dL — ABNORMAL HIGH (ref 70–99)
Potassium: 5.4 mmol/L — ABNORMAL HIGH (ref 3.5–5.2)
Sodium: 136 mmol/L (ref 134–144)
eGFR: 44 mL/min/{1.73_m2} — ABNORMAL LOW (ref >59)

## 2022-09-07 LAB — PRO B NATRIURETIC PEPTIDE: NT-Pro BNP: 3744 pg/mL — ABNORMAL HIGH (ref 0–486)

## 2022-09-09 DIAGNOSIS — I493 Ventricular premature depolarization: Secondary | ICD-10-CM | POA: Diagnosis not present

## 2022-09-13 DIAGNOSIS — M25511 Pain in right shoulder: Secondary | ICD-10-CM | POA: Diagnosis not present

## 2022-09-19 ENCOUNTER — Telehealth: Payer: Self-pay

## 2022-09-19 DIAGNOSIS — I10 Essential (primary) hypertension: Secondary | ICD-10-CM

## 2022-09-19 NOTE — Telephone Encounter (Signed)
Spoke with daughter per [facility] regarding lab results. Pt will be having ECHO on Friday and will have BMP done at that time

## 2022-09-21 ENCOUNTER — Other Ambulatory Visit: Payer: Self-pay

## 2022-09-21 MED ORDER — AMLODIPINE BESYLATE 5 MG PO TABS: 5.0000 mg | ORAL_TABLET | Freq: Every day | ORAL | 3 refills | Status: DC

## 2022-09-23 ENCOUNTER — Ambulatory Visit (INDEPENDENT_AMBULATORY_CARE_PROVIDER_SITE_OTHER): Payer: Medicare Other

## 2022-09-23 DIAGNOSIS — R0609 Other forms of dyspnea: Secondary | ICD-10-CM | POA: Diagnosis not present

## 2022-09-23 LAB — ECHOCARDIOGRAM COMPLETE
AR max vel: 1.87 cm2
AV Area VTI: 2.19 cm2
AV Area mean vel: 1.95 cm2
AV Mean grad: 3 mmHg
AV Peak grad: 6.4 mmHg
Ao pk vel: 1.26 m/s
Area-P 1/2: 4.31 cm2
Calc EF: 47.4 %
S' Lateral: 4.33 cm
Single Plane A2C EF: 49.2 %
Single Plane A4C EF: 45.7 %

## 2022-09-23 MED ORDER — PERFLUTREN LIPID MICROSPHERE
1.0000 mL | INTRAVENOUS | Status: AC | PRN
Start: 2022-09-23 — End: 2022-09-23
  Administered 2022-09-23: 3 mL via INTRAVENOUS

## 2022-09-26 ENCOUNTER — Ambulatory Visit: Payer: Medicare Other | Admitting: Internal Medicine

## 2022-09-27 ENCOUNTER — Encounter: Payer: Self-pay | Admitting: Orthopedic Surgery

## 2022-09-27 ENCOUNTER — Encounter: Payer: Self-pay | Admitting: Internal Medicine

## 2022-09-27 ENCOUNTER — Ambulatory Visit: Payer: Medicare Other | Admitting: Internal Medicine

## 2022-09-27 VITALS — BP 124/76 | HR 80 | Temp 98.1°F | Resp 18 | Ht 67.0 in | Wt 165.4 lb

## 2022-09-27 DIAGNOSIS — M12811 Other specific arthropathies, not elsewhere classified, right shoulder: Secondary | ICD-10-CM | POA: Insufficient documentation

## 2022-09-27 DIAGNOSIS — I5042 Chronic combined systolic (congestive) and diastolic (congestive) heart failure: Secondary | ICD-10-CM

## 2022-09-27 DIAGNOSIS — Z933 Colostomy status: Secondary | ICD-10-CM | POA: Diagnosis not present

## 2022-09-27 DIAGNOSIS — E1165 Type 2 diabetes mellitus with hyperglycemia: Secondary | ICD-10-CM | POA: Insufficient documentation

## 2022-09-27 DIAGNOSIS — K5732 Diverticulitis of large intestine without perforation or abscess without bleeding: Secondary | ICD-10-CM | POA: Diagnosis not present

## 2022-09-27 DIAGNOSIS — I2510 Atherosclerotic heart disease of native coronary artery without angina pectoris: Secondary | ICD-10-CM

## 2022-09-27 HISTORY — DX: Other specific arthropathies, not elsewhere classified, right shoulder: M12.811

## 2022-09-27 HISTORY — DX: Type 2 diabetes mellitus with hyperglycemia: E11.65

## 2022-09-27 MED ORDER — OXYCODONE-ACETAMINOPHEN 5-325 MG PO TABS: 1.0000 | ORAL_TABLET | Freq: Four times a day (QID) | ORAL | 0 refills | Status: AC | PRN

## 2022-09-27 MED ORDER — OZEMPIC (0.25 OR 0.5 MG/DOSE) 2 MG/1.5ML ~~LOC~~ SOPN: 0.2500 mg | PEN_INJECTOR | SUBCUTANEOUS | 1 refills | Status: DC

## 2022-09-27 NOTE — Assessment & Plan Note (Signed)
I am not sure what happened to his sugars over a 2 month period where he went up 1.5% on his HgBA1c despite going up on his dose of jardiance.  He states he is taking his meds.  I want him to start taking his FSBS twice a day.  I am going to add ozempic to his regimen at this time.  This was taught in the clinic.

## 2022-09-27 NOTE — Assessment & Plan Note (Signed)
We will continue to optimize his risk factors.

## 2022-09-27 NOTE — Assessment & Plan Note (Addendum)
His ECHO from 09/23/2022 showed an EF 25-30% with global hypokinesis.  I spoke to Dr. Kirtland Bouchard and he is going to refer the patient for ICD placement.  There is no evidence of volume overload on exam today.

## 2022-09-27 NOTE — Progress Notes (Signed)
Office Visit  Subjective   Patient ID: [name]   DOB: 05-22-1945   Age: 77 y.o.   MRN: [id number]   Chief Complaint Chief Complaint  Patient presents with  . Surgical Clearance     History of Present Illness [name] is a 77 yo male who comes in today for preoperative clearance where ortho wishes to perform a right total should replacement.  I saw him on 09/02/2022 where he followed up after an ER visit on 08/31/2022 with right shoulder pain.  He has a known history of bilateral shoulder pain in his shoulders that started while he was in the hospital with his admission for diverticulitis.  He hit his right arm on a car door prior to that hospitailzation where he has a large hematoma of his biceps of his right arm.  The patient decided to take xarelto every other day after that.  I saw him on 07/22/2022 and wanted to obtain shoulder xrays which were done on 07/25/2022 and his right shoulder showed soft tissue calcification adjacent to the humeral head suggestive of calcific tendiopathy and he had AC joint and glenohumeral degenerative changes.  His left shoulder xray showed irregularity of the distal clavicle most consistent with remote traumatic change with widening of the [facility] joint and glenohumeral degenerative change.  We did refer him to Dr. [name] (I do not have his notes) where the patient tells me he had a kenalog injection in his right shoulder.  He had this injection about a week before he presented to the ER on 08/31/2022 where he had continued complaints of right shoulder pain and swelling.  They did repeat xrays of his shoulder and did a CT scan of his right shoulder that showed soft tissue swelling which was suggestive of a hematoma and an infectious etiology could not be ruled out.  He had a minimal ESR elevation but normal CRP and no white count.  They contacted Dr. [name] and he recommended a CT scan which showed a shoulder effusion and possible myositis vs. Intramuscular hemorrhage.  He  recommended a steriod taper and analgesics as needed and to followup with him and myself in the clinic.  He recommended him to get a MRI of his shoulder.  He stated during followup with me that his pain had worsened since his ER visit. He admitted at that time that he never did start the prednisone pack and he wanted to talk to me before he started this.  I asked him to start the prednisone pack and I felt at that time he could have had a hemearthrosis as the patient was on a blood thinner at that time.  He had a followup appointment with ortho already scheduled on 09/06/2022.  His MRI of his right shoulder on 09/13/2022 showed a full thickness full width retracted rupture of the supraspinatus and subscapularis tendons.  There was full thickness nearly full width treating of the infraspinatus with severe supraspinatus, infraspinatus and subscapularis atrophy.  There is medial dislocation of the intraarticular segment of the long head of the biceps with a glenohumeral joint effusion communicating with the subacromial subdeltoid bursa with extensive synovitis and moderate degenerative AC joint arthropathy.  He followed up with ortho on 09/06/2022 and they decided for this surgery after reviewing his MRI and they wanted him to have medical and cardiac clearance.  He went for his preoperative labs on 09/07/2022 where he had an EKG that showed sinus rhythm with PVC's.  He was noted  to have an elevated HgBA1c of 8.9% at that time.    The patient is a 77 year old Caucasian/White male who returns for a follow-up visit for his T2 diabetes.  I saw him on 07/22/2022 about 2 months ago where his HgBa1c was 7.4%.  I asked him to start on jardiance 25mg  daily at that time.  Again, he was diagnosed with T2 diabetes in the 1990's.  Since the last visit, there have been no problems. He remains on glipizide 10 mg BID and Jardiance 10mg  daily.  He is not walking as much as they would like.[name] He specifically denies unexplained fatigue,  palpitations, unexplained abdominal pain, nausea or vomiting or hypoglycemia. He does check blood sugars daily where they range from 200-230 with some elevated ones that are intermittent.  He came in fasting today in anticipation of lab work. He stopped metformin in the past due to diarrhea.  His last HgBa1c was done 2 months ago and was 7.4% but again for his preoperative anesthesia workup, his HgBA1c was 8.9% on 09/07/2022.  He has no complications of diabetic retinopathy, neuropathy, or nephropathy.  He does have PVD and CAD associated with his diabetes.     He has other risk factors including his history of HTN, DM, HCL, CAD, A. Fib and combined systolic and diastolic CHF.  He did go see cardiology for preoperative clearacne on 09/06/2022.  They noted at that time that his A. Fib was maintaining in sinus rhythm.  He is on Xarelto 20mg  every other day and ASA 81mg  daily.  His BP has been controlled with visits to [place] over the last year.  He has moderately severe CAD with history of MI in 1992 with PTCA of RCA.  He had an inferior wall MI in 2000 that was treated with PCI to LAD.  He also had an inferior MI in 2006 with DES to RCA and developed in-stent restenosis with repeat stenting in 2006.  He had a myocardial perfusion scan done on 09/2017 that showed an LVEF of 29%.  There was a large defect of severe severity present in the basal inferoseptal, basal inferior, mid inferoseptal, mid inferior, apical anterior, apical septal, apical inferior, apical lateral and apex location.  This was a high risk study and there were findings  consistent with prior myocardial infarction. It was a high risk study due to a very large old scar and severely reduced left ventricular systolic function. No reversible ischemia was seen. A LHC was performed 2019 that showed severe stenosis of proximal LAD that was treated with DES and RCA was found to be chronically occluded with left-to-right collaterals.  His last visit with cardiology  was in 06/21/2022 where they felt his CAD was stable and he was not having any angina.  See PMH for summary of cardiac history and status of revascularization. His CAD is controlled with therapy as summarized in the medication list and previous notes. The patient has no comorbid conditions. He has the following baseline symptoms: exertional dyspnea. He has the following modifiable risk factor(s): HTN, DM, hyperlipidemia, and sedentary lifestyle. Specifically denied complaint(s): chest pain, palpitations, orthopnea, edema, exertional dyspnea, and syncope. He quit smoking in 02/2021.  They felt his CAD was stable when he saw cardiology but he has known ischemic cardiomyopathy (see below).   The patient also has a history of combined chronic systolic and diastolic CHF of years duration and presents for a regular status visit. The etiology of the CHF is secondary to ischemic  cardiomyopathy related to his CAD as described above.  His CHF has been in a compensated state on medications as noted in the medication list. He has the following baseline symptoms: exertional dyspnea. He states his baseline is that he can go up an entire flight of 10 stairs before he gets DOE. Specifically denied complaints: orthopnea, PND, edema, worsening orthopnea, worsening edema, and worsening exertional dyspnea.  His last ECHO was done in 02/28/2022 that showed a LVEF 60-65%. Left ventricular ejection fraction by PLAX is 25 %. The left ventricle has severely decreased function. The left ventricle demonstrates global hypokinesis. The left ventricular internal cavity size was mildly dilated. Left ventricular diastolic parameters are consistent with Grade II diastolic dysfunction (pseudonormalization).  His right ventricular systolic function is mildly reduced. The right ventricular size is normal. There was normal pulmonary artery systolic pressure.  3. Left atrial size was mildly dilated.  The mitral valve is degenerative. Mild mitral valve  regurgitation. No evidence of mitral stenosis.  Cardiology saw him on 06/21/2022 where they increased his dose of entresto to 49/51mg  BID.  They discussed possible referral to EP for discussion of possible ICD.  When he saw cardiology on 09/06/2022 they wanted to repeat his ECHO to check his EF and go from there to see if he needs ICD placement.  He had an ECHO done on 09/23/2022 that showed his EF was 25-30%. The left ventricle has severely decreased function. The left ventricle  demonstrates global hypokinesis. Left ventricular diastolic parameters are consistent with Grade I diastolic dysfunction (impaired relaxation). Elevated left ventricular end-diastolic pressure.  The right ventricular systolic function is normal. The right ventricular size is normal. There is mildly elevated pulmonary artery systolic pressure.  His left atrial size was severely dilated.     Past Medical History Past Medical History:  Diagnosis Date  . Atherosclerosis of native arteries of the extremities with intermittent claudication 08/13/2013  . Bladder cancer (HCC)    resection x3  . CAD, NATIVE VESSEL 09/22/2008   Qualifier: Diagnosis of  By: Vikki Ports    . CARDIOMYOPATHY, ISCHEMIC 09/22/2008   Qualifier: Diagnosis of  By: Vikki Ports    . Chronic systolic CHF (congestive heart failure) (HCC)   . Cigarette smoker   . Coronary artery disease    status post DMI RX Taxus stent RCA 2006 with susequent Stent LAD and subsequent  stent thrombosis RCA unable to be opened 2006 -neg mv 10/2008, 10/16/17 ISR to pLAD with PTCA/DES, CTO of RCA with collaterals, EF 25%  . Coronary artery disease involving native coronary artery of native heart with unstable angina pectoris (HCC)   . Diverticulitis of sigmoid colon 05/11/2021  . HYPERLIPIDEMIA-MIXED 09/22/2008   Qualifier: Diagnosis of  By: Vikki Ports    . HYPERTENSION, BENIGN 09/22/2008   Qualifier: Diagnosis of  By: Vikki Ports    . Ischemic cardiomyopathy    ejection fraction  of 40-45%  . Lumbar spinal stenosis   . Myocardial infarction (HCC)    "I've had 4" (10/16/2017)  . NSVT (nonsustained ventricular tachycardia) (HCC)   . Paroxysmal atrial fibrillation (HCC)   . PVC's (premature ventricular contractions)   . PVD 09/22/2008   Qualifier: Diagnosis of  By: Vikki Ports    . Status post coronary artery stent placement   . SYSTOLIC HEART FAILURE, CHRONIC 09/22/2008   Qualifier: Diagnosis of  By: Vikki Ports    . TOBACCO ABUSE 05/29/2009   Qualifier: Diagnosis of  By: Meryl Crutch RN, BSN, Doreatha Lew  Type II diabetes mellitus (HCC)   . Unstable angina (HCC) 10/16/2017     Allergies Allergies  Allergen Reactions  . Lisinopril Swelling and Rash    Rash - face and tounge swell     Medications  Current Outpatient Medications:  .  JARDIANCE 10 MG TABS tablet, Take 10 mg by mouth daily., Disp: , Rfl:  .  amLODipine (NORVASC) 5 MG tablet, Take 1 tablet (5 mg total) by mouth daily., Disp: 90 tablet, Rfl: 3 .  aspirin EC 81 MG tablet, Take 1 tablet (81 mg total) by mouth daily. Swallow whole., Disp: 90 tablet, Rfl: 3 .  carvedilol (COREG) 12.5 MG tablet, Take 12.5 mg by mouth 2 (two) times daily with a meal., Disp: , Rfl:  .  cetirizine (ZYRTEC) 10 MG tablet, Take 10 mg by mouth daily., Disp: , Rfl:  .  glipiZIDE (GLUCOTROL) 10 MG tablet, Take 1 tablet (10 mg total) by mouth 2 (two) times daily., Disp: 60 tablet, Rfl: 6 .  nitroGLYCERIN (NITROSTAT) 0.4 MG SL tablet, Place 1 tablet (0.4 mg total) under the tongue every 5 (five) minutes as needed for chest pain., Disp: 90 tablet, Rfl: 3 .  rivaroxaban (XARELTO) 20 MG TABS tablet, Take 1 tablet (20 mg total) by mouth daily with supper., Disp: 30 tablet, Rfl: 0 .  rosuvastatin (CRESTOR) 20 MG tablet, Take 1 tablet (20 mg total) by mouth daily., Disp: 90 tablet, Rfl: 1 .  sacubitril-valsartan (ENTRESTO) 49-51 MG, Take 1 tablet by mouth 2 (two) times daily., Disp: 60 tablet, Rfl: 3 .  sacubitril-valsartan (ENTRESTO)  49-51 MG, Take 1 tablet by mouth 2 (two) times daily., Disp: 56 tablet, Rfl: 0   Review of Systems Review of Systems  Constitutional:  Negative for chills and fever.  Eyes:  Negative for blurred vision and double vision.  Respiratory:  Negative for shortness of breath.   Cardiovascular:  Negative for chest pain, palpitations and leg swelling.  Gastrointestinal:  Negative for abdominal pain, constipation, diarrhea, nausea and vomiting.  Musculoskeletal:  Negative for myalgias.  Skin:  Negative for itching and rash.  Neurological:  Negative for dizziness, weakness and headaches.  Psychiatric/Behavioral:  Negative for depression. The patient is not nervous/anxious.        Objective:    Vitals BP 124/76 (BP Location: Left Arm, Patient Position: Sitting, Cuff Size: Normal)   Pulse 80   Temp 98.1 F (36.7 C) (Temporal)   Resp 18   Ht 5\' 7"  (1.702 m)   Wt 165 lb 6.4 oz (75 kg)   SpO2 92%   BMI 25.91 kg/m    Physical Examination Physical Exam Constitutional:      Appearance: Normal appearance. He is not ill-appearing.  Cardiovascular:     Rate and Rhythm: Normal rate and regular rhythm.     Pulses: Normal pulses.     Heart sounds: No murmur heard.    No friction rub. No gallop.  Pulmonary:     Effort: Pulmonary effort is normal. No respiratory distress.     Breath sounds: No wheezing, rhonchi or rales.  Abdominal:     General: Abdomen is flat. Bowel sounds are normal. There is no distension.     Palpations: Abdomen is soft.     Tenderness: There is no abdominal tenderness.  Musculoskeletal:     Right lower leg: No edema.     Left lower leg: No edema.  Skin:    General: Skin is warm and dry.     Findings:  No rash.  Neurological:     General: No focal deficit present.     Mental Status: He is alert and oriented to person, place, and time.  Psychiatric:        Mood and Affect: Mood normal.        Behavior: Behavior normal.       Assessment & Plan:   Poorly  controlled T2 diabetes mellitus (HCC) I am not sure what happened to his sugars over a 2 month period where he went up 1.5% on his HgBA1c despite going up on his dose of jardiance.  He states he is taking his meds.  I want him to start taking his FSBS twice a day.  I am going to add ozempic to his regimen at this time.  This was taught in the clinic.  Rotator cuff arthropathy of right shoulder He is not going to get medical or cardiac clearance at this time.  We need to get his HgBA1c down.  He still has pain at times and I will give him some pain meds only to use as needed.  Chronic combined systolic (congestive) and diastolic (congestive) heart failure (HCC) His ECHO from 09/23/2022 showed an EF 25-30% with global hypokinesis.  I spoke to Dr. Kirtland Bouchard and he is going to refer the patient for ICD placement.  There is no evidence of volume overload on exam today.  Coronary artery disease We will continue to optimize his risk factors.    Return in about 4 weeks (around 10/25/2022).   Crist Fat, MD

## 2022-09-27 NOTE — Assessment & Plan Note (Signed)
He is not going to get medical or cardiac clearance at this time.  We need to get his HgBA1c down.  He still has pain at times and I will give him some pain meds only to use as needed.

## 2022-09-28 ENCOUNTER — Telehealth: Payer: Self-pay

## 2022-09-28 NOTE — Telephone Encounter (Signed)
  Patient notified through my chart. Patient has appt on 10/06/22

## 2022-09-28 NOTE — Telephone Encounter (Signed)
Primary Cardiologist:[name], MD  Chart reviewed as part of pre-operative protocol coverage. Because of [name]'s past medical history and time since last visit, he/she will require a follow-up visit in order to better assess preoperative cardiovascular risk.  Pre-op covering staff: - Patient has an appointment with Dr. Bing Matter on 10/06/22 at which time request for cardiac risk assessment for upcoming right total shoulder replacement can be addressed.  - Please contact requesting surgeon's office via preferred method (i.e, phone, fax) to inform them of need for appointment prior to surgery.  This message has also been routed to pharmacy pool for input on holding anticoagulant agent as requested below so that this information is available at time of patient's appointment.   Levi Aland, NP-C  09/28/2022, 7:49 AM [address], [address] Office [phone] Fax [phone]

## 2022-09-28 NOTE — Telephone Encounter (Signed)
Pt is scheduled to see Dr. Bing Matter, 10/06/22, clearance will be addressed at that time.  Will route to the requesting surgeon's office to make them aware.

## 2022-09-28 NOTE — Telephone Encounter (Signed)
Patient with diagnosis of afib on Xarelto for anticoagulation.    Procedure: right total shoulder replacement Date of procedure: TBD  CHA2DS2-VASc Score = 6  This indicates a 9.7% annual risk of stroke. The patient's score is based upon: CHF History: 1 HTN History: 1 Diabetes History: 1 Stroke History: 0 Vascular Disease History: 1 Age Score: 2 Gender Score: 0   CrCl 78mL/min Platelet count 224K  Per office protocol, patient can hold Xarelto for 3 days prior to procedure.    **This guidance is not considered finalized until pre-operative APP has relayed final recommendations.**

## 2022-09-28 NOTE — Telephone Encounter (Signed)
-----   Message from Georgeanna Lea, MD sent at 09/28/2022  1:03 PM EDT ----- Left ventricle ejection fraction still reduced, we will talk during next visit about options

## 2022-10-04 ENCOUNTER — Other Ambulatory Visit: Payer: Medicare Other

## 2022-10-06 ENCOUNTER — Encounter: Payer: Self-pay | Admitting: Cardiology

## 2022-10-06 ENCOUNTER — Ambulatory Visit: Payer: Medicare Other | Attending: Cardiology | Admitting: Cardiology

## 2022-10-06 ENCOUNTER — Telehealth (HOSPITAL_COMMUNITY): Payer: Self-pay | Admitting: Radiology

## 2022-10-06 VITALS — BP 124/84 | HR 88 | Ht 67.0 in | Wt 165.8 lb

## 2022-10-06 DIAGNOSIS — I255 Ischemic cardiomyopathy: Secondary | ICD-10-CM

## 2022-10-06 DIAGNOSIS — I2510 Atherosclerotic heart disease of native coronary artery without angina pectoris: Secondary | ICD-10-CM

## 2022-10-06 DIAGNOSIS — I480 Paroxysmal atrial fibrillation: Secondary | ICD-10-CM | POA: Diagnosis not present

## 2022-10-06 DIAGNOSIS — R0609 Other forms of dyspnea: Secondary | ICD-10-CM

## 2022-10-06 DIAGNOSIS — Z7984 Long term (current) use of oral hypoglycemic drugs: Secondary | ICD-10-CM

## 2022-10-06 DIAGNOSIS — K080 Exfoliation of teeth due to systemic causes: Secondary | ICD-10-CM | POA: Diagnosis not present

## 2022-10-06 DIAGNOSIS — E1159 Type 2 diabetes mellitus with other circulatory complications: Secondary | ICD-10-CM

## 2022-10-06 NOTE — Patient Instructions (Signed)
Medication Instructions:  Your physician recommends that you continue on your current medications as directed. Please refer to the Current Medication list given to you today.  *If you need a refill on your cardiac medications before your next appointment, please call your pharmacy*   Lab Work: None If you have labs (blood work) drawn today and your tests are completely normal, you will receive your results only by: MyChart Message (if you have MyChart) OR A paper copy in the mail If you have any lab test that is abnormal or we need to change your treatment, we will call you to review the results.   Testing/Procedures:   [facility] [address] [place], [place] [address] Phone:  [phone]    Please arrive 15 minutes prior to your appointment time for registration and insurance purposes.  The test will take approximately 3 to 4 hours to complete; you may bring reading material.  If someone comes with you to your appointment, they will need to remain in the main lobby due to limited space in the testing area. **If you are pregnant or breastfeeding, please notify the nuclear lab prior to your appointment**  How to prepare for your Myocardial Perfusion Test: Do not eat or drink 3 hours prior to your test, except you may have water. Do not consume products containing caffeine (regular or decaffeinated) 12 hours prior to your test. (ex: coffee, chocolate, sodas, tea). Do bring a list of your current medications with you.  If not listed below, you may take your medications as normal. HOLD diabetic medication/insulin the morning of the test: Jardiance and Glucotrol Do wear comfortable clothes (no dresses or overalls) and walking shoes, tennis shoes preferred (No heels or open toe shoes are allowed). Do NOT wear cologne, perfume, aftershave, or lotions (deodorant is allowed). If these instructions are not followed, your test will have to be  rescheduled.  Please report to [address] for your test.  If you have questions or concerns about your appointment, you can call the [facility] at [phone].  If you cannot keep your appointment, please provide 24 hours notification to the Nuclear Lab, to avoid a possible $50 charge to your account.    Follow-Up: At [facility], you and your health needs are our priority.  As part of our continuing mission to provide you with exceptional heart care, we have created designated Provider Care Teams.  These Care Teams include your primary Cardiologist (physician) and Advanced Practice Providers (APPs -  Physician Assistants and Nurse Practitioners) who all work together to provide you with the care you need, when you need it.  We recommend signing up for the patient portal called "MyChart".  Sign up information is provided on this After Visit Summary.  MyChart is used to connect with patients for Virtual Visits (Telemedicine).  Patients are able to view lab/test results, encounter notes, upcoming appointments, etc.  Non-urgent messages can be sent to your provider as well.   To learn more about what you can do with MyChart, go to [email or web address].    Your next appointment:   1 month(s)  Provider:   Gypsy Balsam, MD    Other Instructions None

## 2022-10-06 NOTE — Progress Notes (Signed)
Cardiology Office Note:    Date:  10/06/2022   ID:  [name], DOB 11-01-1945, MRN [id number]  PCP:  Crist Fat, MD  Cardiologist:  Gypsy Balsam, MD    Referring MD: Leonia Reader, Barbara Cower, MD   No chief complaint on file. I need to have my surgery for shoulder and reverse colostomy  History of Present Illness:    [name] is a 77 y.o. male with extremely complex past medical history that include paroxysmal atrial fibrillation, COPD, history of ischemic cardiomyopathy, essential hypertension, hyperlipidemia.  He comes today to my office to talk about incoming surgery for his reverse colostomy as well as potential shoulder surgery ischemic history is very complex initial event happened in 1992 when he suffered from inferior wall myocardial infarction, PTCA and stent to RCA has been placed at that time 2000 for anterior wall MI required stent to LAD in 2006 he had another myocardial infarction low inferior wall with Taxus stent placed in the right coronary artery and shortly after that another cardiac catheterization has been performed which showed total occlusion of right coronary artery.  Last cardiac catheterization done in July 2019 showing severe stenosis of the proximal LAD treated with drug-eluting stent right coronary artery was chronically occluded echocardiogram from October 2019 showed ejection fraction 25 to 30%.  Recently he underwent surgical intervention of his abdomen that was in February apparently he did have some chest pain around surgical time but likely no myocardial injury. He comes today to talk about his issues.  He denies have any chest pain tightness squeezing pressure burning chest, he complained of being weak tired but otherwise doing fine.  Past Medical History:  Diagnosis Date   Atherosclerosis of native arteries of the extremities with intermittent claudication 08/13/2013   Bladder cancer [facility])    resection x3   CAD, NATIVE VESSEL 09/22/2008   Qualifier:  Diagnosis of  By: Vikki Ports     CARDIOMYOPATHY, ISCHEMIC 09/22/2008   Qualifier: Diagnosis of  By: Vikki Ports     Chronic systolic CHF (congestive heart failure) (HCC)    Cigarette smoker    Coronary artery disease    status post DMI RX Taxus stent RCA 2006 with susequent Stent LAD and subsequent  stent thrombosis RCA unable to be opened 2006 -neg mv 10/2008, 10/16/17 ISR to pLAD with PTCA/DES, CTO of RCA with collaterals, EF 25%   Coronary artery disease involving native coronary artery of native heart with unstable angina pectoris (HCC)    Diverticulitis of sigmoid colon 05/11/2021   HYPERLIPIDEMIA-MIXED 09/22/2008   Qualifier: Diagnosis of  By: Vikki Ports     HYPERTENSION, BENIGN 09/22/2008   Qualifier: Diagnosis of  By: Vikki Ports     Ischemic cardiomyopathy    ejection fraction of 40-45%   Lumbar spinal stenosis    Myocardial infarction (HCC)    "I've had 4" (10/16/2017)   NSVT (nonsustained ventricular tachycardia) (HCC)    Paroxysmal atrial fibrillation (HCC)    PVC's (premature ventricular contractions)    PVD 09/22/2008   Qualifier: Diagnosis of  By: Vikki Ports     Status post coronary artery stent placement    SYSTOLIC HEART FAILURE, CHRONIC 09/22/2008   Qualifier: Diagnosis of  By: Vikki Ports     TOBACCO ABUSE 05/29/2009   Qualifier: Diagnosis of  By: Meryl Crutch RN, BSN, Patricia L    Type II diabetes mellitus (HCC)    Unstable angina (HCC) 10/16/2017    Past Surgical History:  Procedure  Laterality Date   AORTA - BILATERAL FEMORAL ARTERY BYPASS GRAFT Bilateral 1992   APPENDECTOMY     BACK SURGERY     COLECTOMY WITH COLOSTOMY CREATION/HARTMANN PROCEDURE N/A 05/26/2022   Procedure: COLECTOMY WITH COLOSTOMY CREATION/HARTMANN PROCEDURE;  Surgeon: Diamantina Monks, MD;  Location: [facility] OR;  Service: General;  Laterality: N/A;   CORONARY ANGIOPLASTY WITH STENT PLACEMENT     taxus stent  placed into his right coronary artery; stent placed to the LAD.     CORONARY STENT  INTERVENTION N/A 10/16/2017   Procedure: CORONARY STENT INTERVENTION;  Surgeon: Kathleene Hazel, MD;  Location: [facility] INVASIVE CV LAB;  Service: Cardiovascular;  Laterality: N/A;   FEMORAL-FEMORAL BYPASS GRAFT  07/2009   LAPAROTOMY N/A 05/26/2022   Procedure: EXPLORATORY LAPAROTOMY;  Surgeon: Diamantina Monks, MD;  Location: [facility] OR;  Service: General;  Laterality: N/A;   LUMBAR MICRODISCECTOMY  12/31/08   RIGHT/LEFT HEART CATH AND CORONARY ANGIOGRAPHY N/A 10/16/2017   Procedure: RIGHT/LEFT HEART CATH AND CORONARY ANGIOGRAPHY;  Surgeon: Kathleene Hazel, MD;  Location: [facility] INVASIVE CV LAB;  Service: Cardiovascular;  Laterality: N/A;   SHOULDER OPEN ROTATOR CUFF REPAIR Left     Current Medications: Current Meds  Medication Sig   amLODipine (NORVASC) 5 MG tablet Take 1 tablet (5 mg total) by mouth daily.   aspirin EC 81 MG tablet Take 1 tablet (81 mg total) by mouth daily. Swallow whole.   carvedilol (COREG) 12.5 MG tablet Take 12.5 mg by mouth 2 (two) times daily with a meal.   cetirizine (ZYRTEC) 10 MG tablet Take 10 mg by mouth daily.   glipiZIDE (GLUCOTROL) 10 MG tablet Take 1 tablet (10 mg total) by mouth 2 (two) times daily.   JARDIANCE 10 MG TABS tablet Take 10 mg by mouth daily.   nitroGLYCERIN (NITROSTAT) 0.4 MG SL tablet Place 1 tablet (0.4 mg total) under the tongue every 5 (five) minutes as needed for chest pain.   rivaroxaban (XARELTO) 20 MG TABS tablet Take 1 tablet (20 mg total) by mouth daily with supper.   rosuvastatin (CRESTOR) 20 MG tablet Take 1 tablet (20 mg total) by mouth daily.   sacubitril-valsartan (ENTRESTO) 49-51 MG Take 1 tablet by mouth 2 (two) times daily.   Semaglutide,0.25 or 0.5MG /DOS, (OZEMPIC, 0.25 OR 0.5 MG/DOSE,) 2 MG/1.5ML SOPN Inject 0.25 mg into the skin once a week.     Allergies:   Lisinopril   Social History   Socioeconomic History   Marital status: Divorced    Spouse name: Not on file   Number of children: 1   Years of education: Not on  file   Highest education level: Not on file  Occupational History   Occupation: retired    Associate Professor: RETIRED    Comment: truck driver  Tobacco Use   Smoking status: Former    Packs/day: 0.50    Years: 56.00    Additional pack years: 0.00    Total pack years: 28.00    Types: Cigars, Cigarettes    Quit date: 2022    Years since quitting: 2.4   Smokeless tobacco: Former    Types: Associate Professor Use: Never used  Substance and Sexual Activity   Alcohol use: Yes    Comment: 10/16/2017 "1 beer q 2-3 months"   Drug use: Never   Sexual activity: Not Currently  Other Topics Concern   Not on file  Social History Narrative   Lives with girlfriend in [place].  Social Determinants of Health   Financial Resource Strain: Not on file  Food Insecurity: No Food Insecurity (05/24/2022)   Hunger Vital Sign    Worried About Running Out of Food in the Last Year: Never true    Ran Out of Food in the Last Year: Never true  Transportation Needs: No Transportation Needs (05/24/2022)   PRAPARE - Administrator, Civil Service (Medical): No    Lack of Transportation (Non-Medical): No  Physical Activity: Not on file  Stress: Not on file  Social Connections: Not on file     Family History: The patient's family history includes Cardiomyopathy in his mother; Coronary artery disease in his father and unknown relative; Diabetes in his father, sister, and unknown relative; Heart attack in his father and sister; Heart disease in his father, mother, and sister; Hyperlipidemia in his father, mother, and sister; Stroke in his father. ROS:   Please see the history of present illness.    All 14 point review of systems negative except as described per history of present illness  EKGs/Labs/Other Studies Reviewed:      Recent Labs: 05/24/2022: TSH 6.794 06/01/2022: Magnesium 1.9 07/22/2022: ALT 7; Hemoglobin 12.3; Platelets 224 09/06/2022: BUN 25; Creatinine, Ser 1.59; NT-Pro BNP 3,744;  Potassium 5.4; Sodium 136  Recent Lipid Panel    Component Value Date/Time   CHOL 122 02/28/2022 1037   TRIG 111 02/28/2022 1037   HDL 35 (L) 02/28/2022 1037   CHOLHDL 3.5 02/28/2022 1037   CHOLHDL 6.1 CALC 11/07/2006 0958   VLDL 20 11/07/2006 0958   LDLCALC 66 02/28/2022 1037    Physical Exam:    VS:  BP 124/84 (BP Location: Left Arm, Patient Position: Sitting, Cuff Size: Normal)   Pulse 88   Ht 5\' 7"  (1.702 m)   Wt 165 lb 12.8 oz (75.2 kg)   SpO2 95%   BMI 25.97 kg/m     Wt Readings from Last 3 Encounters:  10/06/22 165 lb 12.8 oz (75.2 kg)  09/27/22 165 lb 6.4 oz (75 kg)  09/06/22 167 lb 3.2 oz (75.8 kg)     GEN:  Well nourished, well developed in no acute distress HEENT: Normal NECK: No JVD; No carotid bruits LYMPHATICS: No lymphadenopathy CARDIAC: RRR, no murmurs, no rubs, no gallops RESPIRATORY:  Clear to auscultation without rales, wheezing or rhonchi  ABDOMEN: Soft, non-tender, non-distended MUSCULOSKELETAL:  No edema; No deformity  SKIN: Warm and dry LOWER EXTREMITIES: no swelling NEUROLOGIC:  Alert and oriented x 3 PSYCHIATRIC:  Normal affect   ASSESSMENT:    1. Coronary artery disease involving native coronary artery of native heart without angina pectoris   2. Dyspnea on exertion   3. Ischemic cardiomyopathy   4. Paroxysmal atrial fibrillation (HCC)   5. Type 2 diabetes mellitus with other circulatory complications (HCC)    PLAN:    In order of problems listed above:  Coronary disease which is extensive with multiple interventions.  He is contemplating having reverse colostomy, echocardiogram performed which shows still severely reduced left ventricle ejection fraction, I will schedule him to have a stress test to see if he have any inducible ischemia he does have known completely occluded right coronary artery with a myocardial infarction in this area.  In the meantime we will continue antiplatelets therapy. History of cardiomyopathy he is on  guideline directed medical therapy which include Entresto, Jardiance, Coreg, we cannot use Aldactone/spironolactone because of high potassium.  I initiated again conversation about potentially ICD he told  me clearly he does not want it. Paroxysmal atrial fibrillation on Xarelto which I will continue.  Denies having any recent palpitations. Type 2 diabetes I did review K PN which show me his hemoglobin A1c of 7.4 this is from April 2024. Dyslipidemia he is on Crestor 20 which I will continue.  He is LDL 66 HDL 35 this is from end of last year. Cardiovascular preop evaluation.  This is a very difficult situation he does have severe cardiomyopathy likely appears to be hemodynamically compensated, I would make me worry is the fact that the last time when he got his surgery he did have some chest pain, likely there was no myocardial injury.  I will ask him to have stress test done to see if he get any inducible ischemia.  Medication Adjustments/Labs and Tests Ordered: Current medicines are reviewed at length with the patient today.  Concerns regarding medicines are outlined above.  Orders Placed This Encounter  Procedures   MYOCARDIAL PERFUSION IMAGING   EKG 12-Lead   Medication changes: No orders of the defined types were placed in this encounter.   Signed, Georgeanna Lea, MD, [facility] 10/06/2022 10:24 AM    [facility]

## 2022-10-06 NOTE — Telephone Encounter (Signed)
Left message on voicemail per DPR in reference to upcoming appointment scheduled on 6/25/ at 11:15 with detailed instructions given per Stress Test Requisition Sheet for the test. LM to arrive 30 minutes early, and that it is imperative to arrive on time for appointment to keep from having the test rescheduled. If you need to cancel or reschedule your appointment, please call the office within 24 hours of your appointment. Failure to do so may result in a cancellation of your appointment, and a $50 no show fee. Phone number given for call back for any questions. EHK

## 2022-10-11 ENCOUNTER — Ambulatory Visit: Payer: Medicare Other | Attending: Cardiology

## 2022-10-11 DIAGNOSIS — I2510 Atherosclerotic heart disease of native coronary artery without angina pectoris: Secondary | ICD-10-CM

## 2022-10-11 DIAGNOSIS — R0609 Other forms of dyspnea: Secondary | ICD-10-CM | POA: Diagnosis not present

## 2022-10-11 LAB — MYOCARDIAL PERFUSION IMAGING
LV dias vol: 133 mL (ref 62–150)
LV sys vol: 85 mL
Nuc Stress EF: 36 %
Peak HR: 96 {beats}/min
Rest HR: 75 {beats}/min
Rest Nuclear Isotope Dose: 10.9 mCi
SDS: 1
SRS: 36
SSS: 37
ST Depression (mm): 0 mm
Stress Nuclear Isotope Dose: 31.8 mCi
TID: 1.13

## 2022-10-11 MED ORDER — REGADENOSON 0.4 MG/5ML IV SOLN
0.4000 mg | Freq: Once | INTRAVENOUS | Status: AC
Start: 2022-10-11 — End: 2022-10-11
  Administered 2022-10-11: 0.4 mg via INTRAVENOUS

## 2022-10-11 MED ORDER — TECHNETIUM TC 99M TETROFOSMIN IV KIT
10.9000 | PACK | Freq: Once | INTRAVENOUS | Status: AC | PRN
  Administered 2022-10-11: 10.9 via INTRAVENOUS

## 2022-10-11 MED ORDER — TECHNETIUM TC 99M TETROFOSMIN IV KIT
31.8000 | PACK | Freq: Once | INTRAVENOUS | Status: AC | PRN
  Administered 2022-10-11: 31.8 via INTRAVENOUS

## 2022-10-13 DIAGNOSIS — K080 Exfoliation of teeth due to systemic causes: Secondary | ICD-10-CM | POA: Diagnosis not present

## 2022-10-24 ENCOUNTER — Other Ambulatory Visit: Payer: Self-pay | Admitting: Internal Medicine

## 2022-10-26 ENCOUNTER — Ambulatory Visit: Payer: Medicare Other | Admitting: Internal Medicine

## 2022-10-27 ENCOUNTER — Other Ambulatory Visit: Payer: Self-pay | Admitting: Nurse Practitioner

## 2022-10-27 NOTE — Telephone Encounter (Signed)
This is a Reed pt. Dr. Bing Matter see pt now. Thanks

## 2022-10-28 ENCOUNTER — Encounter: Payer: Self-pay | Admitting: Internal Medicine

## 2022-10-28 ENCOUNTER — Ambulatory Visit: Payer: Medicare Other | Admitting: Internal Medicine

## 2022-10-28 VITALS — BP 130/80 | HR 56 | Temp 98.5°F | Resp 18 | Ht 67.0 in | Wt 175.0 lb

## 2022-10-28 DIAGNOSIS — E7800 Pure hypercholesterolemia, unspecified: Secondary | ICD-10-CM

## 2022-10-28 DIAGNOSIS — I10 Essential (primary) hypertension: Secondary | ICD-10-CM | POA: Diagnosis not present

## 2022-10-28 DIAGNOSIS — E1159 Type 2 diabetes mellitus with other circulatory complications: Secondary | ICD-10-CM

## 2022-10-28 HISTORY — DX: Pure hypercholesterolemia, unspecified: E78.00

## 2022-10-28 NOTE — Assessment & Plan Note (Addendum)
He has diabetes associated with PAD and CAD.  His last HgBa1c was done 3 months ago and was 7.4%, but again for his preoperative anesthesia workup, his HgBA1c was 8.9% on 09/07/2022.  I started him on ozempic 4 weeks ago.  We will recheck his HgBA1c today.  I will also refer him to optometry.

## 2022-10-28 NOTE — Assessment & Plan Note (Signed)
His BP is controlled.  We will continue on his current meds.

## 2022-10-28 NOTE — Progress Notes (Signed)
Office Visit  Subjective   Patient ID: [name]   DOB: 11-04-1945   Age: 77 y.o.   MRN: [id number]   Chief Complaint Chief Complaint  Patient presents with   Follow-up    3 month follow up     History of Present Illness The patient is a 77 year old Caucasian/White male who presents for a follow-up evaluation of hypertension.   Since his last visit, there has been no problems.  The patient has been checking his blood pressure at home.  His BP diary shows his systolic runs in the 120-130's.  The patient's current medications include: coreg 12.5mg  BID, amlodipine 5mg  daily, and entresto 49/51mg  BID.  The patient has been tolerating his medications well. The patient denies any dizziness, lightheadness, chest pain, and shortness of breath. He reports there have been no other symptoms noted.   The patient is a 77 year old Caucasian/White male who returns for a follow-up visit for his T2 diabetes.  I saw him a month ago where we were doing preoperative clearance.  I decided to start him on ozempic where he is currently on 0.5mg  subcut weekly.  Again, he was diagnosed with T2 diabetes in the 1990's.  He remains on glipizide 10 mg BID, Jardiance 10mg  daily and ozempic 0.5mg  subcut weekly.  He is not walking as much as they would like.[name] He specifically denies unexplained fatigue, palpitations, unexplained abdominal pain, nausea or vomiting or hypoglycemia. He does check blood sugars daily where they range from 110-180.  He came in fasting today in anticipation of lab work. He stopped metformin in the past due to diarrhea.  His last HgBa1c was done 3 months ago and was 7.4%, but again for his preoperative anesthesia workup, his HgBA1c was 8.9% on 09/07/2022.  He has no complications of diabetic retinopathy, neuropathy, or nephropathy.  He does have PVD and CAD associated with his diabetes.     The patient also returns today for routine followup on his cholesterol. Overall, he states he is doing well and  is without any complaints or problems at this time. He specifically denies abdominal pain, nausea, vomiting, diarrhea, myalgias, and fatigue.  He remains on dietary management as well as crestor 20mg  qhs. He is fasting in anticipation for labs today.      Past Medical History Past Medical History:  Diagnosis Date   Atherosclerosis of native arteries of the extremities with intermittent claudication 08/13/2013   Bladder cancer [facility])    resection x3   CAD, NATIVE VESSEL 09/22/2008   Qualifier: Diagnosis of  By: Vikki Ports     CARDIOMYOPATHY, ISCHEMIC 09/22/2008   Qualifier: Diagnosis of  By: Vikki Ports     Chronic systolic CHF (congestive heart failure) (HCC)    Cigarette smoker    Coronary artery disease    status post DMI RX Taxus stent RCA 2006 with susequent Stent LAD and subsequent  stent thrombosis RCA unable to be opened 2006 -neg mv 10/2008, 10/16/17 ISR to pLAD with PTCA/DES, CTO of RCA with collaterals, EF 25%   Coronary artery disease involving native coronary artery of native heart with unstable angina pectoris (HCC)    Diverticulitis of sigmoid colon 05/11/2021   HYPERLIPIDEMIA-MIXED 09/22/2008   Qualifier: Diagnosis of  By: Vikki Ports     HYPERTENSION, BENIGN 09/22/2008   Qualifier: Diagnosis of  By: Vikki Ports     Ischemic cardiomyopathy    ejection fraction of 40-45%   Lumbar spinal stenosis  Myocardial infarction (HCC)    "I've had 4" (10/16/2017)   NSVT (nonsustained ventricular tachycardia) (HCC)    Paroxysmal atrial fibrillation (HCC)    PVC's (premature ventricular contractions)    PVD 09/22/2008   Qualifier: Diagnosis of  By: Vikki Ports     Status post coronary artery stent placement    SYSTOLIC HEART FAILURE, CHRONIC 09/22/2008   Qualifier: Diagnosis of  By: Vikki Ports     TOBACCO ABUSE 05/29/2009   Qualifier: Diagnosis of  By: Meryl Crutch RN, BSN, Doreatha Lew    Type II diabetes mellitus (HCC)    Unstable angina (HCC) 10/16/2017      Allergies Allergies  Allergen Reactions   Lisinopril Swelling and Rash    Rash - face and tounge swell     Medications  Current Outpatient Medications:    amLODipine (NORVASC) 5 MG tablet, Take 1 tablet (5 mg total) by mouth daily., Disp: 90 tablet, Rfl: 3   aspirin EC 81 MG tablet, Take 1 tablet (81 mg total) by mouth daily. Swallow whole., Disp: 90 tablet, Rfl: 3   carvedilol (COREG) 12.5 MG tablet, Take 12.5 mg by mouth 2 (two) times daily with a meal., Disp: , Rfl:    cetirizine (ZYRTEC) 10 MG tablet, Take 10 mg by mouth daily., Disp: , Rfl:    glipiZIDE (GLUCOTROL) 10 MG tablet, TAKE 1 TABLET(10 MG) BY MOUTH TWICE DAILY, Disp: 60 tablet, Rfl: 6   JARDIANCE 10 MG TABS tablet, Take 10 mg by mouth daily., Disp: , Rfl:    nitroGLYCERIN (NITROSTAT) 0.4 MG SL tablet, Place 1 tablet (0.4 mg total) under the tongue every 5 (five) minutes as needed for chest pain., Disp: 90 tablet, Rfl: 3   rivaroxaban (XARELTO) 20 MG TABS tablet, Take 1 tablet (20 mg total) by mouth daily with supper., Disp: 30 tablet, Rfl: 0   rosuvastatin (CRESTOR) 20 MG tablet, Take 1 tablet (20 mg total) by mouth daily., Disp: 90 tablet, Rfl: 1   sacubitril-valsartan (ENTRESTO) 49-51 MG, Take 1 tablet by mouth 2 (two) times daily., Disp: 60 tablet, Rfl: 3   Semaglutide,0.25 or 0.5MG /DOS, (OZEMPIC, 0.25 OR 0.5 MG/DOSE,) 2 MG/1.5ML SOPN, Inject 0.25 mg into the skin once a week., Disp: 2 mL, Rfl: 1   Review of Systems Review of Systems  Constitutional:  Negative for chills and fever.  Eyes:  Negative for blurred vision and double vision.  Respiratory:  Negative for cough, shortness of breath and wheezing.   Cardiovascular:  Negative for chest pain, palpitations and leg swelling.  Gastrointestinal:  Negative for abdominal pain, constipation, diarrhea, nausea and vomiting.  Genitourinary:  Negative for frequency.  Musculoskeletal:  Negative for myalgias.  Skin:  Negative for itching and rash.  Neurological:  Negative  for dizziness, weakness and headaches.  Endo/Heme/Allergies:  Negative for polydipsia.       Objective:    Vitals BP 130/80 (BP Location: Left Arm, Patient Position: Sitting, Cuff Size: Normal)   Pulse (!) 56   Temp 98.5 F (36.9 C)   Resp 18   Ht 5\' 7"  (1.702 m)   Wt 175 lb (79.4 kg)   SpO2 96%   BMI 27.41 kg/m    Physical Examination Physical Exam Constitutional:      Appearance: Normal appearance. He is not ill-appearing.  Cardiovascular:     Rate and Rhythm: Normal rate and regular rhythm.     Pulses: Normal pulses.     Heart sounds: No murmur heard.    No friction rub.  No gallop.  Pulmonary:     Effort: Pulmonary effort is normal. No respiratory distress.     Breath sounds: No wheezing, rhonchi or rales.  Abdominal:     General: Abdomen is flat. Bowel sounds are normal. There is no distension.     Palpations: Abdomen is soft.     Tenderness: There is no abdominal tenderness.  Musculoskeletal:     Right lower leg: No edema.     Left lower leg: No edema.  Skin:    General: Skin is warm and dry.     Findings: No rash.  Neurological:     General: No focal deficit present.     Mental Status: He is alert and oriented to person, place, and time.  Psychiatric:        Mood and Affect: Mood normal.        Behavior: Behavior normal.        Assessment & Plan:   Type 2 diabetes mellitus with other circulatory complications (HCC) He has diabetes associated with PAD and CAD.  His last HgBa1c was done 3 months ago and was 7.4%, but again for his preoperative anesthesia workup, his HgBA1c was 8.9% on 09/07/2022.  I started him on ozempic 4 weeks ago.  We will recheck his HgBA1c today.  I will also refer him to optometry.  Essential hypertension His BP is controlled.  We will continue on his current meds.  Hypercholesterolemia We will check his FLP today with goal LDL <55.    Return in about 3 months (around 01/28/2023) for annual.   Crist Fat, MD

## 2022-10-28 NOTE — Assessment & Plan Note (Signed)
We will check his FLP today with goal LDL <55. 

## 2022-10-29 LAB — CMP14 + ANION GAP
ALT: 11 IU/L (ref 0–44)
AST: 19 IU/L (ref 0–40)
Albumin: 4.3 g/dL (ref 3.8–4.8)
Alkaline Phosphatase: 96 IU/L (ref 44–121)
Anion Gap: 14 mmol/L (ref 10.0–18.0)
BUN/Creatinine Ratio: 12 (ref 10–24)
BUN: 17 mg/dL (ref 8–27)
Bilirubin Total: 0.4 mg/dL (ref 0.0–1.2)
CO2: 22 mmol/L (ref 20–29)
Calcium: 9.9 mg/dL (ref 8.6–10.2)
Chloride: 103 mmol/L (ref 96–106)
Creatinine, Ser: 1.39 mg/dL — ABNORMAL HIGH (ref 0.76–1.27)
Globulin, Total: 2.7 g/dL (ref 1.5–4.5)
Glucose: 165 mg/dL — ABNORMAL HIGH (ref 70–99)
Potassium: 5.1 mmol/L (ref 3.5–5.2)
Sodium: 139 mmol/L (ref 134–144)
Total Protein: 7 g/dL (ref 6.0–8.5)
eGFR: 52 mL/min/{1.73_m2} — ABNORMAL LOW (ref >59)

## 2022-10-29 LAB — HEMOGLOBIN A1C
Est. average glucose Bld gHb Est-mCnc: 243 mg/dL
Hgb A1c MFr Bld: 10.1 % — ABNORMAL HIGH (ref 4.8–5.6)

## 2022-10-29 LAB — LIPID PANEL
Chol/HDL Ratio: 3.6 ratio (ref 0.0–5.0)
Cholesterol, Total: 154 mg/dL (ref 100–199)
HDL: 43 mg/dL (ref >39)
LDL Chol Calc (NIH): 85 mg/dL (ref 0–99)
Triglycerides: 149 mg/dL (ref 0–149)
VLDL Cholesterol Cal: 26 mg/dL (ref 5–40)

## 2022-11-08 ENCOUNTER — Ambulatory Visit: Payer: Medicare Other | Admitting: Cardiology

## 2022-11-17 ENCOUNTER — Other Ambulatory Visit: Payer: Self-pay

## 2022-11-17 MED ORDER — GLUCOSE BLOOD VI STRP: ORAL_STRIP | 12 refills | Status: DC

## 2022-11-22 ENCOUNTER — Other Ambulatory Visit: Payer: Self-pay

## 2022-11-22 MED ORDER — SEMAGLUTIDE (1 MG/DOSE) 4 MG/3ML ~~LOC~~ SOPN: 1.0000 mg | PEN_INJECTOR | SUBCUTANEOUS | 0 refills | Status: DC

## 2022-11-22 MED ORDER — ROSUVASTATIN CALCIUM 40 MG PO TABS: 40.0000 mg | ORAL_TABLET | Freq: Every day | ORAL | 1 refills | Status: DC

## 2022-11-23 NOTE — Progress Notes (Signed)
Patient called.  Patient aware.  VE: Tell him that his diabetes is worsse.  Increase his ozempic to 1mg  subcut weekly x 1 month, then increase to 2mg  subcut weekly.  His cholesterole is not controlled.  Increase his crestor from 20mg  to 40mg  at bedtime.

## 2022-11-24 ENCOUNTER — Telehealth: Payer: Self-pay | Admitting: Cardiology

## 2022-11-24 DIAGNOSIS — Z01818 Encounter for other preprocedural examination: Secondary | ICD-10-CM | POA: Diagnosis not present

## 2022-11-24 NOTE — Telephone Encounter (Signed)
Pharmacy please advise on holding Xarelto prior to Colonoscopy scheduled for TBD . Thank you.

## 2022-11-24 NOTE — Telephone Encounter (Signed)
Dr. Bing Matter  , patient's chart was reviewed for preoperative cardiac evaluation.  He/she was seen by you on 10/06/2022 and according to protocol, we request that you comment on cardiac risk for upcoming procedure since office visit was less than 2 months ago.    Please route your response to p cv div preop.  Thank you, Marcelino Duster

## 2022-11-24 NOTE — Telephone Encounter (Signed)
   Pre-operative Risk Assessment    Patient Name: [name]  DOB: [birth year] MRN: [id number]      Request for Surgical Clearance    Procedure:   Colonoscopy  Date of Surgery:  Clearance TBD                                 Surgeon:  Demetria Pore Surgeon's Group or Practice Name:  [facility]  Phone number:  Unknown Fax number:  [phone]   Type of Clearance Requested:   Pharmacy    Type of Anesthesia:  Not Indicated   Additional requests/questions:    Signed, Belva Bertin   11/24/2022, 1:48 PM

## 2022-11-25 NOTE — Telephone Encounter (Signed)
Patient with diagnosis of afib on Xarelto for anticoagulation.    Procedure: colonoscopy Date of procedure: TBD  CHA2DS2-VASc Score = 6  This indicates a 9.7% annual risk of stroke. The patient's score is based upon: CHF History: 1 HTN History: 1 Diabetes History: 1 Stroke History: 0 Vascular Disease History: 1 Age Score: 2 Gender Score: 0   CrCl 75mL/min Platelet count 224K  Per office protocol, patient can hold Xarelto for 1-2 days prior to procedure.    **This guidance is not considered finalized until pre-operative APP has relayed final recommendations.**

## 2022-11-28 ENCOUNTER — Other Ambulatory Visit: Payer: Self-pay

## 2022-11-28 DIAGNOSIS — I5022 Chronic systolic (congestive) heart failure: Secondary | ICD-10-CM | POA: Insufficient documentation

## 2022-11-28 DIAGNOSIS — E119 Type 2 diabetes mellitus without complications: Secondary | ICD-10-CM | POA: Insufficient documentation

## 2022-11-28 MED ORDER — ENTRESTO 49-51 MG PO TABS: 1.0000 | ORAL_TABLET | Freq: Two times a day (BID) | ORAL | 0 refills | Status: DC

## 2022-11-29 ENCOUNTER — Encounter: Payer: Self-pay | Admitting: Cardiology

## 2022-11-29 ENCOUNTER — Ambulatory Visit: Payer: Medicare Other | Attending: Cardiology | Admitting: Cardiology

## 2022-11-29 VITALS — BP 120/72 | HR 76 | Ht 67.0 in | Wt 169.6 lb

## 2022-11-29 DIAGNOSIS — E785 Hyperlipidemia, unspecified: Secondary | ICD-10-CM

## 2022-11-29 DIAGNOSIS — I255 Ischemic cardiomyopathy: Secondary | ICD-10-CM | POA: Diagnosis not present

## 2022-11-29 DIAGNOSIS — I2510 Atherosclerotic heart disease of native coronary artery without angina pectoris: Secondary | ICD-10-CM

## 2022-11-29 DIAGNOSIS — I480 Paroxysmal atrial fibrillation: Secondary | ICD-10-CM

## 2022-11-29 DIAGNOSIS — E1159 Type 2 diabetes mellitus with other circulatory complications: Secondary | ICD-10-CM

## 2022-11-29 DIAGNOSIS — D6859 Other primary thrombophilia: Secondary | ICD-10-CM

## 2022-11-29 DIAGNOSIS — Z7984 Long term (current) use of oral hypoglycemic drugs: Secondary | ICD-10-CM

## 2022-11-29 DIAGNOSIS — I5020 Unspecified systolic (congestive) heart failure: Secondary | ICD-10-CM | POA: Diagnosis not present

## 2022-11-29 DIAGNOSIS — Z01810 Encounter for preprocedural cardiovascular examination: Secondary | ICD-10-CM

## 2022-11-29 NOTE — Progress Notes (Signed)
Cardiology Office Note:  .   Date:  11/29/2022  ID:  [name], DOB 1945/10/18, MRN [id number] PCP: Crist Fat, MD  [facility] Providers Cardiologist:  Gypsy Balsam, MD    History of Present Illness: .   [name] is a 77 y.o. male with a past medical history of CAD s/p multiple interventions, ischemic cardiomyopathy, NSVT, PAF, PVCs, DM 2, PVD, PAD, COPD, hypertension, history of bladder cancer, hyperlipidemia, tobacco abuse.  10/11/2022 MPI prior infarction, no current ischemia. 09/23/2022 echocardiogram EF 25 to 30%, severely decreased function, global hypokinesis, grade 1 DD, mildly elevated PASP, mild MR, mild calcification aortic valve without stenosis 02/28/2022 echocardiogram EF 60 to 65%, global hypokinesis, grade 2 DD, mild MR 02/28/2022 carotid ultrasound 1 to 39% stenosis bilaterally 10/16/2017 left heart cath CTO of RCA with left-to-right collaterals, severe stenosis proximal LAD, PTCA/DES to proximal LAD  Evaluated by Dr. Bing Matter on 10/06/2022, he was evaluated for preoperative clearance for possible reverse colostomy and upcoming shoulder surgery.  A stress test was arranged and completed on 10/11/2022 revealing previous infarction no current ischemia.  He presents today for follow up after ischemic evaluation. He has been doing well, offers no formal complaints.  He does occasionally noticed some DOE, however this has been predictable and consistent for him for some time. He denies chest pain, palpitations, dyspnea, pnd, orthopnea, n, v, dizziness, syncope, edema, weight gain, or early satiety.    ROS: Review of Systems  Constitutional: Negative.   HENT: Negative.    Eyes: Negative.   Respiratory:  Positive for shortness of breath (with exertion).   Cardiovascular: Negative.   Gastrointestinal:        Presence of colostomy  Genitourinary: Negative.   Musculoskeletal:  Positive for joint pain.  Skin: Negative.   Neurological: Negative.    Endo/Heme/Allergies: Negative.   Psychiatric/Behavioral: Negative.       Studies Reviewed: .        Cardiac Studies & Procedures   CARDIAC CATHETERIZATION  CARDIAC CATHETERIZATION 10/16/2017  Narrative  Prox RCA lesion is 100% stenosed.  Mid RCA to Dist RCA lesion is 100% stenosed.  Ost LM to Mid LM lesion is 30% stenosed.  Ost LAD to Prox LAD lesion is 90% stenosed.  A drug-eluting stent was successfully placed using a STENT SYNERGY DES 3X28.  Post intervention, there is a 0% residual stenosis.  LV end diastolic pressure is normal.  1. Chronic occlusion RCA with distal vessel filling from left to right collaterals. 2. Severe stenosis proximal LAD in the old stented segment (bare metal stent was placed in 2000). 3. Successful PTCA/DES x  Proximal LAD 4. Normal filling pressures  Recommendations: Will continue ASA and Plavix for one month along with Xarelto. Restart Xarelto in am. Will use ASA for only one month then will stop and continue Plavix with Xarelto for one year. Continue beta blocker and statin. Likely d/c home tomorrow am.  Findings Coronary Findings Diagnostic  Dominance: Right  Left Main Ost LM to Mid LM lesion is 30% stenosed.  Left Anterior Descending Vessel is large. Ost LAD to Prox LAD lesion is 90% stenosed. The lesion was previously treated using a bare metal stent over 2 years ago.  Right Coronary Artery Vessel is large. Prox RCA lesion is 100% stenosed. The lesion is chronically occluded. Mid RCA to Dist RCA lesion is 100% stenosed. The lesion is chronically occluded. The lesion was previously treated.  Right Posterior Atrioventricular Artery Collaterals RPAV filled by collaterals  from 1st Sept.  Intervention  Ost LAD to Prox LAD lesion Stent Pre-stent angioplasty was performed using a BALLOON WOLVERINE 2.50X10. A drug-eluting stent was successfully placed using a STENT SYNERGY DES 3X28. Stent strut is well apposed. Stent overlaps  previously placed stent. Post-stent angioplasty was performed using a BALLOON SAPPHIRE [place] 3.5X18. Post-Intervention Lesion Assessment The intervention was successful. Pre-interventional TIMI flow is 3. Post-intervention TIMI flow is 3. No complications occurred at this lesion. There is a 0% residual stenosis post intervention.   STRESS TESTS  MYOCARDIAL PERFUSION IMAGING 10/11/2022  Narrative   Findings are consistent with infarction and no ischemia.   No ST deviation was noted.   Left ventricular function is abnormal. Global function is moderately reduced. Nuclear stress EF: 36 %. The left ventricular ejection fraction is moderately decreased (30-44%). End diastolic cavity size is mildly enlarged.   Prior study available for comparison from 09/28/2017.   ECHOCARDIOGRAM  ECHOCARDIOGRAM COMPLETE 09/23/2022  Narrative ECHOCARDIOGRAM REPORT    Patient Name:   [name] Date of Exam: 09/23/2022 Medical Rec #:  [id number]     Height:       67.0 in Accession #:    [id number]    Weight:       167.2 lb Date of Birth:  [birth year]     BSA:          1.874 m Patient Age:    76 years      BP:           110/65 mmHg Patient Gender: M             HR:           83 bpm. Exam Location:  Outpatient  Procedure: 2D Echo, 3D Echo, Cardiac Doppler, Color Doppler, Intracardiac Opacification Agent and Strain Analysis  Indications:    R06.9 DOE  History:        Patient has prior history of Echocardiogram examinations, most recent 02/28/2022. Cardiomyopathy and CHF, CAD and Previous Myocardial Infarction, PAD, Arrythmias:Atrial Fibrillation and PVC, Signs/Symptoms:Dyspnea; Risk Factors:Hypertension, Diabetes, Dyslipidemia and Former Smoker. Patient denies chest pain and leg edema. He does have DOE.  Sonographer:    Carlos American RVT, RDCS (AE), RDMS Referring Phys: [phone] Marveen Reeks KRASOWSKI  IMPRESSIONS   1. Left ventricular ejection fraction, by estimation, is 25 to 30%. The left ventricle has  severely decreased function. The left ventricle demonstrates global hypokinesis. Left ventricular diastolic parameters are consistent with Grade I diastolic dysfunction (impaired relaxation). Elevated left ventricular end-diastolic pressure. 2. Right ventricular systolic function is normal. The right ventricular size is normal. There is mildly elevated pulmonary artery systolic pressure. 3. Left atrial size was severely dilated. 4. The mitral valve is normal in structure. Mild mitral valve regurgitation. No evidence of mitral stenosis. 5. The aortic valve is normal in structure. There is mild calcification of the aortic valve. There is mild thickening of the aortic valve. Aortic valve regurgitation is not visualized. No aortic stenosis is present. Aortic valve area, by VTI measures 2.19 cm. Aortic valve mean gradient measures 3.0 mmHg. Aortic valve Vmax measures 1.26 m/s. 6. The inferior vena cava is normal in size with greater than 50% respiratory variability, suggesting right atrial pressure of 3 mmHg.  Comparison(s): EF 60%, LT EF by PLAX 25%, GLS -4.7%.  FINDINGS Left Ventricle: Left ventricular ejection fraction, by estimation, is 25 to 30%. The left ventricle has severely decreased function. The left ventricle demonstrates global hypokinesis. Definity contrast agent was given IV  to delineate the left ventricular endocardial borders. The left ventricular internal cavity size was normal in size. There is no left ventricular hypertrophy. Left ventricular diastolic parameters are consistent with Grade I diastolic dysfunction (impaired relaxation). Elevated left ventricular end-diastolic pressure.  Right Ventricle: The right ventricular size is normal. No increase in right ventricular wall thickness. Right ventricular systolic function is normal. There is mildly elevated pulmonary artery systolic pressure. The tricuspid regurgitant velocity is 2.97 m/s, and with an assumed right atrial pressure of  3 mmHg, the estimated right ventricular systolic pressure is 38.3 mmHg.  Left Atrium: Left atrial size was severely dilated.  Right Atrium: Right atrial size was normal in size.  Pericardium: There is no evidence of pericardial effusion.  Mitral Valve: The mitral valve is normal in structure. Mild mitral valve regurgitation. No evidence of mitral valve stenosis.  Tricuspid Valve: The tricuspid valve is normal in structure. Tricuspid valve regurgitation is trivial. No evidence of tricuspid stenosis.  Aortic Valve: The aortic valve is normal in structure. There is mild calcification of the aortic valve. There is mild thickening of the aortic valve. Aortic valve regurgitation is not visualized. No aortic stenosis is present. Aortic valve mean gradient measures 3.0 mmHg. Aortic valve peak gradient measures 6.4 mmHg. Aortic valve area, by VTI measures 2.19 cm.  Pulmonic Valve: The pulmonic valve was normal in structure. Pulmonic valve regurgitation is not visualized. No evidence of pulmonic stenosis.  Aorta: The aortic root is normal in size and structure.  Venous: The inferior vena cava is normal in size with greater than 50% respiratory variability, suggesting right atrial pressure of 3 mmHg.  IAS/Shunts: No atrial level shunt detected by color flow Doppler.   LEFT VENTRICLE PLAX 2D LVIDd:         5.05 cm      Diastology LVIDs:         4.33 cm      LV e' medial:    4.43 cm/s LV PW:         0.97 cm      LV E/e' medial:  22.0 LV IVS:        1.02 cm      LV e' lateral:   7.31 cm/s LVOT diam:     2.10 cm      LV E/e' lateral: 13.4 LV SV:         45 LV SV Index:   24           2D Longitudinal Strain LVOT Area:     3.46 cm     2D Strain GLS (A2C):   -5.4 % 2D Strain GLS (A3C):   -5.2 % 2D Strain GLS (A4C):   -4.5 % LV Volumes (MOD)            2D Strain GLS Avg:     -5.0 % LV vol d, MOD A2C: 157.0 ml LV vol d, MOD A4C: 134.0 ml LV vol s, MOD A2C: 79.8 ml LV vol s, MOD A4C: 72.7 ml  3D  Volume EF: LV SV MOD A2C:     77.2 ml  3D EF:        36 % LV SV MOD A4C:     134.0 ml LV EDV:       128 ml LV SV MOD BP:      68.9 ml  LV ESV:       81 ml LV SV:        47 ml  RIGHT  VENTRICLE RV S prime:     9.56 cm/s TAPSE (M-mode): 1.7 cm  LEFT ATRIUM             Index        RIGHT ATRIUM           Index LA diam:        4.50 cm 2.40 cm/m   RA Area:     18.30 cm LA Vol (A2C):   83.9 ml 44.77 ml/m  RA Volume:   54.70 ml  29.19 ml/m LA Vol (A4C):   66.6 ml 35.54 ml/m LA Biplane Vol: 75.4 ml 40.23 ml/m AORTIC VALVE                    PULMONIC VALVE AV Area (Vmax):    1.87 cm     PV Vmax:       1.38 m/s AV Area (Vmean):   1.95 cm     PV Peak grad:  7.6 mmHg AV Area (VTI):     2.19 cm AV Vmax:           126.00 cm/s AV Vmean:          76.200 cm/s AV VTI:            0.207 m AV Peak Grad:      6.4 mmHg AV Mean Grad:      3.0 mmHg LVOT Vmax:         68.00 cm/s LVOT Vmean:        43.000 cm/s LVOT VTI:          0.131 m LVOT/AV VTI ratio: 0.63  AORTA Ao Root diam: 3.60 cm Ao Arch diam: 2.6 cm  MITRAL VALVE                TRICUSPID VALVE MV Area (PHT): 4.31 cm     TR Peak grad:   35.3 mmHg MV Decel Time: 176 msec     TR Vmax:        297.00 cm/s MV E velocity: 97.60 cm/s MV A velocity: 106.00 cm/s  SHUNTS MV E/A ratio:  0.92         Systemic VTI:  0.13 m Systemic Diam: 2.10 cm  Chilton Si MD Electronically signed by Chilton Si MD Signature Date/Time: 09/23/2022/4:45:43 PM    Final    MONITORS  CARDIAC EVENT MONITOR 05/31/2016  Narrative Sinus rhythm. Non-sustained ventricular tachycardia, asymptomatic at time of events. Several 3 second pauses. Premature ventricular contractions  His monitor has been reviewed over the last month due to NSVT and he has been seen in EP clinic by Dr. Johney Frame (04/27/16 and 05/27/16). Continue beta blocker.           Risk Assessment/Calculations:    CHA2DS2-VASc Score = 6   This indicates a 9.7% annual risk of  stroke. The patient's score is based upon: CHF History: 1 HTN History: 1 Diabetes History: 1 Stroke History: 0 Vascular Disease History: 1 Age Score: 2 Gender Score: 0            Physical Exam:   VS:  BP 120/72   Pulse 76   Ht 5\' 7"  (1.702 m)   Wt 169 lb 9.6 oz (76.9 kg)   SpO2 92%   BMI 26.56 kg/m    Wt Readings from Last 3 Encounters:  11/29/22 169 lb 9.6 oz (76.9 kg)  10/28/22 175 lb (79.4 kg)  10/11/22 165 lb (74.8 kg)    GEN: Well nourished,  well developed in no acute distress NECK: No JVD; No carotid bruits CARDIAC: RRR, no murmurs, rubs, gallops RESPIRATORY:  Clear to auscultation without rales, wheezing or rhonchi  ABDOMEN: Soft, non-tender, non-distended EXTREMITIES:  No edema; No deformity   ASSESSMENT AND PLAN: .   Coronary artery disease -multiple interventions, most recent left heart cath CTO of RCA with left-to-right collaterals, severe stenosis proximal LAD, PTCA/DES to proximal LAD.  Repeat ischemic evaluation on 10/11/2022 revealed no current ischemia, prior infarct.  Continue aspirin 81 mg daily, continue Coreg 12.5 mg twice daily. HFrEF -NYHA class I, euvolemic.  Continue Coreg 12.5 mg twice daily, continue Jardiance 10 mg daily, continue Entresto 49-51 mg twice daily cannot be on MRA secondary to hyperkalemia.most recent EF 25 to 30%, did not want ICD. PAF/hypercoagulable state -CHA2DS2-VASc score of 6, continue Xarelto 20 mg daily--no indication for dose reduction as most recent creatinine clearance was 53, continue Coreg 12.5 mg twice daily. Dyslipidemia -most recent LDL was 85 on 10/29/2022, would prefer for this to be less than 70, his PCP increased his Crestor as well as adjusted his Ozempic.  Managed by PCP. DM2-most recent A1c was elevated greater than 10, this has been very bothersome for him, he is trying to watch what he is eating.  His surgery for possible colostomy takedown has been postponed secondary to this. Preoperative evaluation -needing an  upcoming colonoscopy with Dr. Jennye Boroughs for possibility of colostomy takedown. According to the Revised Cardiac Risk Index (RCRI), his Perioperative Risk of Major Cardiac Event is (%): 6.6 His Functional Capacity in METs is: 5.62 according to the Duke Activity Status Index (DASI).  Ischemic evaluation revealed no inducible ischemia.  Therefore, based on ACC/AHA guidelines, patient would be at acceptable risk for the planned procedure without further cardiovascular testing.  Regarding his Xarelto, this may be held for 1 to 2 days prior to procedure and resumed as soon as possible.  I will route this recommendation to the requesting party via Epic fax function.        Dispo: Follow-up with general cardiology in 3 months.  Signed, Flossie Dibble, NP

## 2022-11-29 NOTE — Patient Instructions (Signed)
Medication Instructions:  Your physician recommends that you continue on your current medications as directed. Please refer to the Current Medication list given to you today.  *If you need a refill on your cardiac medications before your next appointment, please call your pharmacy*   Lab Work: NONE If you have labs (blood work) drawn today and your tests are completely normal, you will receive your results only by: [place] (if you have MyChart) OR A paper copy in the mail If you have any lab test that is abnormal or we need to change your treatment, we will call you to review the results.   Testing/Procedures: NONE   Follow-Up: At [facility], you and your health needs are our priority.  As part of our continuing mission to provide you with exceptional heart care, we have created designated Provider Care Teams.  These Care Teams include your primary Cardiologist (physician) and Advanced Practice Providers (APPs -  Physician Assistants and Nurse Practitioners) who all work together to provide you with the care you need, when you need it.  We recommend signing up for the patient portal called "MyChart".  Sign up information is provided on this After Visit Summary.  MyChart is used to connect with patients for Virtual Visits (Telemedicine).  Patients are able to view lab/test results, encounter notes, upcoming appointments, etc.  Non-urgent messages can be sent to your provider as well.   To learn more about what you can do with MyChart, go to [email or web address].    Your next appointment:   3 month(s)  Provider:   Jenne Campus, MD    Other Instructions

## 2022-11-30 ENCOUNTER — Telehealth: Payer: Self-pay | Admitting: Cardiology

## 2022-11-30 NOTE — Telephone Encounter (Signed)
   Pre-operative Risk Assessment    Patient Name: [name]  DOB: October 07, 1945 MRN: [id number]      Request for Surgical Clearance    Procedure:   Colonoscopy   Date of Surgery:  Clearance TBD                                 Surgeon:  Tollie Pizza Misenhelmer Surgeon's Group or Practice Name:  [facility]  Phone number:  Unknown Fax number:  [phone]   Type of Clearance Requested:   - Pharmacy:  Hold Rivaroxaban (Xarelto)     Type of Anesthesia:  Not Indicated   Additional requests/questions:    Golden Pop   11/30/2022, 2:07 PM

## 2022-11-30 NOTE — Telephone Encounter (Signed)
Patient with diagnosis of afib on Xarelto for anticoagulation.    Procedure: colonoscopy Date of procedure: TBD   CHA2DS2-VASc Score = 6   This indicates a 9.7% annual risk of stroke. The patient's score is based upon: CHF History: 1 HTN History: 1 Diabetes History: 1 Stroke History: 0 Vascular Disease History: 1 Age Score: 2 Gender Score: 0      CrCl 48 ml/min  Per office protocol, patient can hold Xarelto for 2 days prior to procedure.    **This guidance is not considered finalized until pre-operative APP has relayed final recommendations.**

## 2022-12-01 NOTE — Telephone Encounter (Signed)
     Primary Cardiologist: Gypsy Balsam, MD  Chart reviewed as part of pre-operative protocol coverage. Given past medical history and time since last visit, based on ACC/AHA guidelines, ROSCO ELMORE would be at acceptable risk for the planned procedure without further cardiovascular testing.   Patient with diagnosis of afib on Xarelto for anticoagulation.     Procedure: colonoscopy Date of procedure: TBD     CHA2DS2-VASc Score = 6   This indicates a 9.7% annual risk of stroke. The patient's score is based upon: CHF History: 1 HTN History: 1 Diabetes History: 1 Stroke History: 0 Vascular Disease History: 1 Age Score: 2 Gender Score: 0       CrCl 48 ml/min   Per office protocol, patient can hold Xarelto for 2 days prior to procedure.  I will route this recommendation to the requesting party via Epic fax function and remove from pre-op pool.  Please call with questions.  Thomasene Ripple.  NP-C     12/01/2022, 8:28 AM [facility] [address] Office [phone] Fax [phone]

## 2022-12-15 ENCOUNTER — Telehealth: Payer: Self-pay | Admitting: Cardiology

## 2022-12-15 DIAGNOSIS — K5732 Diverticulitis of large intestine without perforation or abscess without bleeding: Secondary | ICD-10-CM | POA: Diagnosis not present

## 2022-12-15 DIAGNOSIS — Z933 Colostomy status: Secondary | ICD-10-CM | POA: Diagnosis not present

## 2022-12-15 NOTE — Telephone Encounter (Signed)
   Patient Name: [name]  DOB: [birth year] MRN: [id number]  Primary Cardiologist: Gypsy Balsam, MD  Chart reviewed as part of pre-operative protocol coverage. Pt was last seen in the office on 11/29/2022 and was doing well.   Regarding ASA therapy, we recommend continuation of ASA throughout the perioperative period.  However, if the surgeon feels that cessation of ASA is required in the perioperative period, it may be stopped 5-7 days prior to surgery with a plan to resume it as soon as felt to be feasible from a surgical standpoint in the post-operative period.  I will route this recommendation to the requesting party via Epic fax function and remove from pre-op pool.  Please call with questions.  Joylene Grapes, NP 12/15/2022, 4:35 PM

## 2022-12-15 NOTE — Telephone Encounter (Signed)
   Pre-operative Risk Assessment    Patient Name: [name]  DOB: November 29, 1945 MRN: [id number]      Request for Surgical Clearance    Procedure:   Colonoscopy  Date of Surgery:  Clearance  01-17-23                                 Surgeon:  Dr Jennye Boroughs Surgeon's Group or Practice Name:   Phone number:  [phone] Fax number:  [phone]   Type of Clearance Requested:   - Pharmacy:  Hold Aspirin - can they hold the Aspirin for 5 days   Type of Anesthesia:   propofol   Additional requests/questions:    Rivka Safer   12/15/2022, 3:05 PM

## 2022-12-23 ENCOUNTER — Other Ambulatory Visit: Payer: Self-pay

## 2022-12-23 MED ORDER — RIVAROXABAN 20 MG PO TABS: 20.0000 mg | ORAL_TABLET | Freq: Every day | ORAL | 3 refills | Status: DC

## 2023-01-05 ENCOUNTER — Telehealth: Payer: Self-pay

## 2023-01-05 NOTE — Telephone Encounter (Signed)
Applications for Eual Fines and Jardiance completed and faxed. [employer] notified.

## 2023-01-10 ENCOUNTER — Telehealth: Payer: Self-pay

## 2023-01-10 NOTE — Telephone Encounter (Signed)
[employer] informed, [employer] is awaiting 1040 Tax return proof and [name] denied Xarelto however, she is aware [employer] is another source se can seek assistance. Information to contact them has been provided.   Documents processed to scan.  Awaiting [facility] status

## 2023-01-17 DIAGNOSIS — Z7982 Long term (current) use of aspirin: Secondary | ICD-10-CM | POA: Diagnosis not present

## 2023-01-17 DIAGNOSIS — I4891 Unspecified atrial fibrillation: Secondary | ICD-10-CM | POA: Diagnosis not present

## 2023-01-17 DIAGNOSIS — J449 Chronic obstructive pulmonary disease, unspecified: Secondary | ICD-10-CM | POA: Diagnosis not present

## 2023-01-17 DIAGNOSIS — I252 Old myocardial infarction: Secondary | ICD-10-CM | POA: Diagnosis not present

## 2023-01-17 DIAGNOSIS — Z7901 Long term (current) use of anticoagulants: Secondary | ICD-10-CM | POA: Diagnosis not present

## 2023-01-17 DIAGNOSIS — I493 Ventricular premature depolarization: Secondary | ICD-10-CM | POA: Diagnosis not present

## 2023-01-17 DIAGNOSIS — K635 Polyp of colon: Secondary | ICD-10-CM | POA: Diagnosis not present

## 2023-01-17 DIAGNOSIS — D122 Benign neoplasm of ascending colon: Secondary | ICD-10-CM | POA: Diagnosis not present

## 2023-01-17 DIAGNOSIS — I2510 Atherosclerotic heart disease of native coronary artery without angina pectoris: Secondary | ICD-10-CM | POA: Diagnosis not present

## 2023-01-17 DIAGNOSIS — Z860100 Personal history of colon polyps, unspecified: Secondary | ICD-10-CM | POA: Diagnosis not present

## 2023-01-17 DIAGNOSIS — Z79899 Other long term (current) drug therapy: Secondary | ICD-10-CM | POA: Diagnosis not present

## 2023-01-17 DIAGNOSIS — E785 Hyperlipidemia, unspecified: Secondary | ICD-10-CM | POA: Diagnosis not present

## 2023-01-17 DIAGNOSIS — K5730 Diverticulosis of large intestine without perforation or abscess without bleeding: Secondary | ICD-10-CM | POA: Diagnosis not present

## 2023-01-17 DIAGNOSIS — K219 Gastro-esophageal reflux disease without esophagitis: Secondary | ICD-10-CM | POA: Diagnosis not present

## 2023-01-17 DIAGNOSIS — K6289 Other specified diseases of anus and rectum: Secondary | ICD-10-CM | POA: Diagnosis not present

## 2023-01-17 DIAGNOSIS — Z7984 Long term (current) use of oral hypoglycemic drugs: Secondary | ICD-10-CM | POA: Diagnosis not present

## 2023-01-17 DIAGNOSIS — Z933 Colostomy status: Secondary | ICD-10-CM | POA: Diagnosis not present

## 2023-01-17 DIAGNOSIS — E1151 Type 2 diabetes mellitus with diabetic peripheral angiopathy without gangrene: Secondary | ICD-10-CM | POA: Diagnosis not present

## 2023-01-17 DIAGNOSIS — D126 Benign neoplasm of colon, unspecified: Secondary | ICD-10-CM | POA: Diagnosis not present

## 2023-01-17 DIAGNOSIS — K5732 Diverticulitis of large intestine without perforation or abscess without bleeding: Secondary | ICD-10-CM | POA: Diagnosis not present

## 2023-01-17 DIAGNOSIS — Z8551 Personal history of malignant neoplasm of bladder: Secondary | ICD-10-CM | POA: Diagnosis not present

## 2023-01-17 DIAGNOSIS — Z955 Presence of coronary angioplasty implant and graft: Secondary | ICD-10-CM | POA: Diagnosis not present

## 2023-01-17 DIAGNOSIS — I10 Essential (primary) hypertension: Secondary | ICD-10-CM | POA: Diagnosis not present

## 2023-01-24 ENCOUNTER — Other Ambulatory Visit: Payer: Self-pay | Admitting: Cardiovascular Disease

## 2023-01-24 ENCOUNTER — Encounter: Payer: Self-pay | Admitting: Internal Medicine

## 2023-02-06 ENCOUNTER — Telehealth: Payer: Self-pay

## 2023-02-06 NOTE — Telephone Encounter (Signed)
Application package refax too [employer] per patient request

## 2023-02-06 NOTE — Telephone Encounter (Signed)
LM explaining [employer] approved assistance without prescription [employer]'s assistance. Letter of approval place in scan pile.

## 2023-02-09 ENCOUNTER — Other Ambulatory Visit: Payer: Self-pay

## 2023-02-09 MED ORDER — CETIRIZINE HCL 10 MG PO TABS: 10.0000 mg | ORAL_TABLET | Freq: Every day | ORAL | 0 refills | Status: DC

## 2023-02-14 ENCOUNTER — Telehealth: Payer: Self-pay | Admitting: Cardiology

## 2023-02-14 NOTE — Telephone Encounter (Signed)
LM to return my call. 

## 2023-02-14 NOTE — Telephone Encounter (Signed)
Office is calling to get the update on the 2 other medication for approval for Jardiance and Xarelto. Please call [phone] and ask for [facility]

## 2023-02-20 ENCOUNTER — Telehealth: Payer: Self-pay | Admitting: Cardiology

## 2023-02-20 NOTE — Telephone Encounter (Signed)
[name] from [employer] is requesting a callback at [phone] regarding them trying to get pt enrolled for assistance with Jardiance and Xarelto. She stated they'd been awaiting on a callback but hadn't heard anything, I did make her aware that a callback was made last week. Please advise

## 2023-02-21 NOTE — Telephone Encounter (Signed)
I called Crystal and LM to return my call

## 2023-02-22 ENCOUNTER — Encounter: Payer: Self-pay | Admitting: Internal Medicine

## 2023-02-22 ENCOUNTER — Ambulatory Visit: Payer: Medicare Other | Admitting: Internal Medicine

## 2023-02-22 VITALS — BP 120/70 | HR 77 | Temp 98.0°F | Resp 16 | Ht 67.0 in | Wt 175.8 lb

## 2023-02-22 DIAGNOSIS — E1159 Type 2 diabetes mellitus with other circulatory complications: Secondary | ICD-10-CM

## 2023-02-22 DIAGNOSIS — I10 Essential (primary) hypertension: Secondary | ICD-10-CM

## 2023-02-22 DIAGNOSIS — I5042 Chronic combined systolic (congestive) and diastolic (congestive) heart failure: Secondary | ICD-10-CM

## 2023-02-22 DIAGNOSIS — N401 Enlarged prostate with lower urinary tract symptoms: Secondary | ICD-10-CM

## 2023-02-22 DIAGNOSIS — I480 Paroxysmal atrial fibrillation: Secondary | ICD-10-CM

## 2023-02-22 DIAGNOSIS — I25110 Atherosclerotic heart disease of native coronary artery with unstable angina pectoris: Secondary | ICD-10-CM

## 2023-02-22 DIAGNOSIS — I4891 Unspecified atrial fibrillation: Secondary | ICD-10-CM

## 2023-02-22 DIAGNOSIS — R350 Frequency of micturition: Secondary | ICD-10-CM | POA: Diagnosis not present

## 2023-02-22 DIAGNOSIS — Z933 Colostomy status: Secondary | ICD-10-CM | POA: Diagnosis not present

## 2023-02-22 DIAGNOSIS — Z6827 Body mass index (BMI) 27.0-27.9, adult: Secondary | ICD-10-CM

## 2023-02-22 DIAGNOSIS — E7800 Pure hypercholesterolemia, unspecified: Secondary | ICD-10-CM

## 2023-02-22 DIAGNOSIS — I255 Ischemic cardiomyopathy: Secondary | ICD-10-CM

## 2023-02-22 DIAGNOSIS — I739 Peripheral vascular disease, unspecified: Secondary | ICD-10-CM

## 2023-02-22 DIAGNOSIS — Z8551 Personal history of malignant neoplasm of bladder: Secondary | ICD-10-CM

## 2023-02-22 DIAGNOSIS — I2510 Atherosclerotic heart disease of native coronary artery without angina pectoris: Secondary | ICD-10-CM

## 2023-02-22 DIAGNOSIS — I70219 Atherosclerosis of native arteries of extremities with intermittent claudication, unspecified extremity: Secondary | ICD-10-CM

## 2023-02-22 DIAGNOSIS — E1165 Type 2 diabetes mellitus with hyperglycemia: Secondary | ICD-10-CM | POA: Diagnosis not present

## 2023-02-22 DIAGNOSIS — K5792 Diverticulitis of intestine, part unspecified, without perforation or abscess without bleeding: Secondary | ICD-10-CM

## 2023-02-22 DIAGNOSIS — M12811 Other specific arthropathies, not elsewhere classified, right shoulder: Secondary | ICD-10-CM

## 2023-02-22 NOTE — Telephone Encounter (Signed)
Fleet Contras is not on DPR.  I called and LM for Crystal to call back  Update [facility]: I called and per [facility] rep stated application was denied however someone stated the appeals process on 02/21/2023. ID: [id number].  Patient on 09/24 has been aware Xarelto was denied and I provided [employer] information to apply for additional assistance.

## 2023-02-22 NOTE — Progress Notes (Unsigned)
Preventive Screening-Counseling & Management     [name] is a 77 y.o. male who presents for Medicare Annual/Subsequent preventive examination.  The patient states that his last eye exam was done about 2 years ago.  He had an appointment with Dr. [name] but he missed it.  There is no family history of colorectal or prostate cancer.  His last colonoscopy was done on 06/17/2021 that showed simoid diverticulosis.  He does not need another colonoscopy due to his age.  The patient has a history of bladder cancer with TCC high grade.  He had a TURB in 2011 per urology but their rerport states he had a biopsy in 2014.  The patient is followed by Dr. [name] for his history of bladder cancer as well as BPH.  He does states he has frequent urination but no blood in his urine.  The patient does not exercise.  The patient has a history of smoking where he starting smoking at age 53 about a ppd and quit in 2020 (about 60 pack year history).  He denies chronic cough, SOB, wheezing or hemoptysis.  He does get yearly flu vaccines.  The patient states he did have a pneumonia vaccine.  He has never had a shingles vaccine.  He has never had any of the COVID-19 vaccines.  He denies any depression, anxiety, or memory loss.  He is on ASA 81mg  daily and xarelto 20mg  every other day.  The patient is a 78 year old Caucasian/White male who presents for a follow-up evaluation of hypertension.   Since his last visit, there has been no problems.  The patient has been checking his blood pressure at home.  His BP diary shows his systolic runs in the 120-130's.  The patient's current medications include: coreg 12.5mg  BID, amlodipine 5mg  daily, and entresto 49/51mg  BID.  The patient has been tolerating his medications well. The patient denies any dizziness, lightheadness, chest pain, and shortness of breath. He reports there have been no other symptoms noted.    The patient is a 77 year old Caucasian/White male who returns for a  follow-up visit for his T2 diabetes.  We saw him several months ago where we were doing preoperative clearance.  I decided to start him on ozempic where he was increased to 0.5mg  subcut weekly.  We saw him in 10/2022 and his A1c was 10.1% where I asked him to increase his ozempic to 1mg  weekly for a month, then increase his ozempic to 2mg  weekly thereafter.  Today, he states he never did this.  He ran out of ozempic last week.  Again, he was diagnosed with T2 diabetes in the 1990's.  He remains on glipizide 10 mg BID, Jardiance 10mg  daily and ozempic 0.5mg  subcut weekly.  He is not walking as much as they would like.[name] He specifically denies unexplained fatigue, palpitations, unexplained abdominal pain, nausea or vomiting or hypoglycemia. He does check blood sugars daily where they range from 110-180.  He came in fasting today in anticipation of lab work. He stopped metformin in the past due to diarrhea.  His last HgBa1c was done 4 months ago and was 10.1%.  He has no complications of diabetic retinopathy, neuropathy, or nephropathy.  He does have PVD and CAD associated with his diabetes.   Again, he did miss his yearly diabetic eye exam this past year.   The patient also returns today for routine followup on his cholesterol. Overall, he states he is doing well and is without  any complaints or problems at this time. He specifically denies abdominal pain, nausea, vomiting, diarrhea, myalgias, and fatigue.  He remains on dietary management as well as crestor 20mg  qhs. He is fasting in anticipation for labs today.   Again, I saw [name] in 09/2022 for preoperative clearance where ortho wishes to perform a right total should replacement.  We did not give him medical clearance at that due to his diabetes not being controlled.  I saw him on 09/02/2022 where he followed up after an ER visit on 08/31/2022 with right shoulder pain.  He has a known history of bilateral shoulder pain in his shoulders that started while he was  in the hospital with his admission for diverticulitis.  He hit his right arm on a car door prior to that hospitailzation where he has a large hematoma of his biceps of his right arm.  The patient decided to take xarelto every other day after that.  I saw him on 07/22/2022 and wanted to obtain shoulder xrays which were done on 07/25/2022 and his right shoulder showed soft tissue calcification adjacent to the humeral head suggestive of calcific tendiopathy and he had AC joint and glenohumeral degenerative changes.  His left shoulder xray showed irregularity of the distal clavicle most consistent with remote traumatic change with widening of the [facility] joint and glenohumeral degenerative change.  We did refer him to Dr. [name] (I do not have his notes) where the patient tells me he had a kenalog injection in his right shoulder.  He had this injection about a week before he presented to the ER on 08/31/2022 where he had continued complaints of right shoulder pain and swelling.  They did repeat xrays of his shoulder and did a CT scan of his right shoulder that showed soft tissue swelling which was suggestive of a hematoma and an infectious etiology could not be ruled out.  He had a minimal ESR elevation but normal CRP and no white count.  They contacted Dr. [name] and he recommended a CT scan which showed a shoulder effusion and possible myositis vs. Intramuscular hemorrhage.  He recommended a steriod taper and analgesics as needed and to followup with him and myself in the clinic.  He recommended him to get a MRI of his shoulder.  He stated during followup with me that his pain had worsened since his ER visit. He admitted at that time that he never did start the prednisone pack and he wanted to talk to me before he started this.  I asked him to start the prednisone pack and I felt at that time he could have had a hemearthrosis as the patient was on a blood thinner at that time.  He had a followup appointment with ortho already  scheduled on 09/06/2022.  His MRI of his right shoulder on 09/13/2022 showed a full thickness full width retracted rupture of the supraspinatus and subscapularis tendons.  There was full thickness nearly full width treating of the infraspinatus with severe supraspinatus, infraspinatus and subscapularis atrophy.  There is medial dislocation of the intraarticular segment of the long head of the biceps with a glenohumeral joint effusion communicating with the subacromial subdeltoid bursa with extensive synovitis and moderate degenerative AC joint arthropathy.  He followed up with ortho on 09/06/2022 and they decided for this surgery after reviewing his MRI and they wanted him to have medical and cardiac clearance.  He went for his preoperative labs on 09/07/2022 where he had an EKG that showed sinus  rhythm with PVC's.  He was noted to have an elevated HgBA1c of 8.9% at that time.    workup, his HgBA1c was 8.9% on 09/07/2022.  He has no complications of diabetic retinopathy, neuropathy, or nephropathy.  He does have PVD and CAD associated with his diabetes.      The patient has a history of CAD, Atrial Fib and combined systolic and diastolic CHF/ischemic cardiomyopathy.  He did go see cardiology for preoperative clearacne on 09/06/2022.  They noted at that time that his A. Fib was maintaining in sinus rhythm.  He is on Xarelto 20mg  every other day and ASA 81mg  daily.  He has moderately severe CAD with history of MI in 1992 with PTCA of RCA.  He had an inferior wall MI in 2000 that was treated with PCI to LAD.  He also had an inferior MI in 2006 with DES to RCA and developed in-stent restenosis with repeat stenting in 2006.  He had a myocardial perfusion scan done on 09/2017 that showed an LVEF of 29%.  There was a large defect of severe severity present in the basal inferoseptal, basal inferior, mid inferoseptal, mid inferior, apical anterior, apical septal, apical inferior, apical lateral and apex location.  This was a  high risk study and there were findings  consistent with prior myocardial infarction. It was a high risk study due to a very large old scar and severely reduced left ventricular systolic function. No reversible ischemia was seen. A LHC was performed 2019 that showed severe stenosis of proximal LAD that was treated with DES and RCA was found to be chronically occluded with left-to-right collaterals.  His last visit with cardiology was in 11/29/2022 where they did a preoperative workup.  He had a myocardial perfusion imaginging done on 10/10/2022 that showed prior infarction but no current ischemia.  He had an ECHO done on 09/23/2022 that showed EF 25 to 30%, severely decreased function, global hypokinesis, grade 1 DD, mildly elevated PASP, mild MR, mild calcification aortic valve without stenosis .  See PMH for summary of cardiac history and status of revascularization. His CAD is controlled with therapy as summarized in the medication list and previous notes.  He has the following baseline symptoms: none.  He states he can go up 3-4 flight of stairs before he gets SOB.[name] He has the following modifiable risk factor(s): HTN, DM, hyperlipidemia, and sedentary lifestyle. Specifically denied complaint(s): chest pain, palpitations, orthopnea, edema, exertional dyspnea, and syncope. Cardiology felt his CAD was stable.  They wanted to continue his medications for his A. Fib.    Again, the patient has a history of combined chronic systolic and diastolic CHF of years duration and presents for a regular status visit. The etiology of the CHF is secondary to ischemic cardiomyopathy related to his CAD as described above.  His CHF has been in a compensated state on medications as noted in the medication list. He has the following baseline symptoms: none. Specifically denied complaints: orthopnea, PND, edema, worsening orthopnea, worsening edema, and worsening exertional dyspnea.  A prior ECHO was done in 02/28/2022 that showed a LVEF  60-65%. Left ventricular ejection fraction by PLAX is 25 %. The left ventricle has severely decreased function. The left ventricle demonstrates global hypokinesis. The left ventricular internal cavity size was mildly dilated. Left ventricular diastolic parameters are consistent with Grade II diastolic dysfunction (pseudonormalization).  His right ventricular systolic function is mildly reduced. The right ventricular size is normal. There was normal pulmonary artery systolic pressure.  3. Left atrial size was mildly dilated.  The mitral valve is degenerative. Mild mitral valve regurgitation. No evidence of mitral stenosis.  Cardiology saw him on 06/21/2022 where they increased his dose of entresto to 49/51mg  BID.  They discussed possible referral to EP for discussion of possible ICD.  When he saw cardiology on 09/06/2022 they wanted to repeat his ECHO to check his EF and go from there to see if he needs ICD placement.  He had an ECHO done on 09/23/2022 that showed his EF was 25-30%. The left ventricle has severely decreased function. The left ventricle demonstrates global hypokinesis. Left ventricular diastolic parameters are consistent with Grade I diastolic dysfunction (impaired relaxation). Elevated left ventricular end-diastolic pressure.  The right ventricular systolic function is normal. The right ventricular size is normal. There is mildly elevated pulmonary artery systolic pressure.  His left atrial size was severely dilated.   I did see Mr. Theil in 07/2022 for followup of his complicated diverticulitis where he was admitted to [facility] from 05/24/2022 until 06/03/2022 from diverticulitis and abscess formation.  He had a CT scan of abd/plevis done which showed diverticulitis of sigmoid with adjacent abscess and iliopsoas abscess.  He was admitted to [facility] from 04/08/2022 until 04/14/2022 for this where he underwent a CT guided left pelvic abscess drain on 04/08/2022.  Cultures grew out E coli.  The patient was  placed on IV antibiotics and was transitioned to oral augmentin at discharge.  He was also noted to have a large central disc extrusion of the L1-L2 level with moderate to severe spinal canal stenosis on CT scan of his abd/pelvis.  He was discharged home on augmentin and flagyl with directions to followup with general surgery as an outpatient.  The patient did followup with general surgery and had a repeat CT scan on 05/12/2022 which showed an increase in size of his abscess where he was referred to the ER for continued care.  There was concern on the CT scan for a fistula between the abscess and colon.  He was admitted back to [facility] on 05/19/2022 and underwent removal of his drain with placement of a new percutaneous drain.  He was started on broad spectrum antibiotics.  General surgery recommended colon resection and colostomy formation.  Again, there was evidence of a possible fistulous tract from the site of the diveticulitis to the abscess cavity.  Of note, while he was at [facility], he has acute renal failure where his creatinine peaked to 1.6.  He received IVF's and this resolved.  They did stop him temporarily on entresto due to his renal failure where he has chronic systolic CHF with severely reduced EF.  He needed lasix for volume overload on 2/3 at [facility].  He became euvolemic and they continued him on Entresto, Aldactone, beta-blocker as per cardiology.   The patient was transferred to [facility] for second opinion where he was admitted there on 05/24/2022. He underwent exploratory laparotomy with Hartman's procedure for complicated diverticulitis with coloatmospheric Fistula by Dr Bedelia Person 05/26/2022.  His penrose drain was removed on 06/02/2022.  They did a referral for skilled nursing facility placement but that was denied by his insurance.  Today, he states they are looking to do a reversal on his ostomy but again we have to look at his HgBA1c to see if his diabetes is controlled.     This is a 77 year old Caucasian/White  male who has mild atrial fibrillation of many years known duration and presents  today for a status visit. He remains rate controlled on coreg 12.5mg  BID and continues on Xarelto 20mg  every other day.  He remains on ASA 81mg  daily.  He last saw cardiology on 06/21/2022 where they noted his CHAD2VASC score was a 6.  Specifically denied complaints: palpitations, chest pain, weakness, syncope and TIAs.     This patient also has moderately severe CAD of about years known duration and presents today for a status visit. He had an inferior MI in 1992 with PTCA of RCA.  He had an inferior wall MI in 2000 that was treated with PCI to LAD.  He also had an inferior MI in 2006 with DES to RCA and developed in-stent restenosis with repeat stenting in 2006.  He had a myocardial perfusion scan done on 09/2017 that showed an LVEF of 29%.  There was a large defect of severe severity present in the basal inferoseptal, basal inferior, mid inferoseptal, mid inferior, apical anterior, apical septal, apical inferior, apical lateral and apex location.  This was a high risk study and there were findings  consistent with prior myocardial infarction. It was a high risk study due to a very large old scar and severely reduced left ventricular systolic function. No reversible ischemia was seen. A LHC was performed 2019 that showed severe stenosis of proximal LAD that was treated with DES and RCA was found to be chronically occluded with left-to-right collaterals.  His last visit with cardiology was in 06/21/2022 where they felt his CAD was stable and he was not having any angina.  See PMH for summary of cardiac history and status of revascularization. His CAD is controlled with therapy as summarized in the medication list and previous notes. The patient has no comorbid conditions. He has the following baseline symptoms:none. He has the following modifiable risk factor(s): HTN, DM, hyperlipidemia, and sedentary lifestyle. Specifically denied  complaint(s): chest pain, palpitations, orthopnea, edema, exertional dyspnea, and syncope.      Are there smokers in your home (other than you)? No  Risk Factors Current exercise habits:  as above   Dietary issues discussed: none   Depression Screen (Note: if answer to either of the following is "Yes", a more complete depression screening is indicated)   Over the past two weeks, have you felt down, depressed or hopeless? No  Over the past two weeks, have you felt little interest or pleasure in doing things? No  Have you lost interest or pleasure in daily life? No  Do you often feel hopeless? No  Do you cry easily over simple problems? No  Activities of Daily Living In your present state of health, do you have any difficulty performing the following activities?:  Driving? No Managing money?  No Feeding yourself? No Getting from bed to chair? No Climbing a flight of stairs? No Preparing food and eating?: No Bathing or showering? No Getting dressed: No Getting to the toilet? No Using the toilet:No Moving around from place to place: No In the past year have you fallen or had a near fall?:No   Are you sexually active?  No  Do you have more than one partner?  No  Hearing Difficulties: Yes- he does have hearing aids Do you often ask people to speak up or repeat themselves? Yes Do you experience ringing or noises in your ears? No Do you have difficulty understanding soft or whispered voices? Yes   Do you feel that you have a problem with memory? No  Do you often misplace  items? No  Do you feel safe at home?  Yes  Cognitive Testing  Alert? Yes  Normal Appearance?Yes  Oriented to person? Yes  Place? Yes   Time? Yes  Recall of three objects?  No  Can perform simple calculations? No  Displays appropriate judgment?Yes  Can read the correct time from a watch face?Yes  Fall Risk Prevention  Any stairs in or around the home? No  If so, are there any without handrails? No   Home free of loose throw rugs in walkways, pet beds, electrical cords, etc? Yes  Adequate lighting in your home to reduce risk of falls? Yes  Use of a cane, walker or w/c? No    Time Up and Go  Was the test performed? Yes .  Length of time to ambulate 10 feet: 9 sec.   Gait steady and fast without use of assistive device    Advanced Directives have been discussed with the patient? Yes   List the Names of Other Physician/Practitioners you currently use: Patient Care Team: Crist Fat, MD as PCP - General (Internal Medicine) Georgeanna Lea, MD as PCP - Cardiology (Cardiology)    Past Medical History:  Diagnosis Date   Acute low back pain 01/12/2015   Acute pain of both shoulders 07/22/2022   AKI (acute kidney injury) (HCC) 05/24/2022   Atherosclerosis of native arteries of the extremities with intermittent claudication 08/13/2013   Backache 02/08/2013   Bladder cancer (HCC)    resection x3   Chronic combined systolic (congestive) and diastolic (congestive) heart failure (HCC) 07/22/2022   Chronic right shoulder pain 09/02/2022   Chronic systolic CHF (congestive heart failure) (HCC)    Cigarette smoker    Coronary artery disease    status post DMI RX Taxus stent RCA 2006 with susequent Stent LAD and subsequent  stent thrombosis RCA unable to be opened 2006 -neg mv 10/2008, 10/16/17 ISR to pLAD with PTCA/DES, CTO of RCA with collaterals, EF 25%   Coronary artery disease involving native coronary artery of native heart with unstable angina pectoris (HCC)    Current moderate episode of major depressive disorder without prior episode (HCC) 03/28/2022   Diverticulitis 06/10/2022   Diverticulitis large intestine 05/11/2021   Diverticulitis of large intestine with abscess 05/24/2022   Diverticulitis of sigmoid colon 05/11/2021   DM2 (diabetes mellitus, type 2) (HCC) 02/09/2022   status post bilateral aortobifemoral bypass [id number] with recent fem to fembypass April  2011 per  DR. Early     Essential hypertension 07/22/2022   Hypercholesterolemia 10/28/2022   HYPERLIPIDEMIA-MIXED 09/22/2008   Qualifier: Diagnosis of  By: Vikki Ports     Hypothyroidism 05/24/2022   Ischemic cardiomyopathy    ejection fraction of 40-45%   Lethargy 03/28/2022   Lumbar spinal stenosis    Myocardial infarction (HCC)    "I've had 4" (10/16/2017)   Need for prophylactic vaccination and inoculation against influenza 03/28/2022   NSVT (nonsustained ventricular tachycardia) (HCC)    PAF (paroxysmal atrial fibrillation) (HCC) 02/09/2022   Paroxysmal atrial fibrillation (HCC)    Poorly controlled T2 diabetes mellitus (HCC) 09/27/2022   Protein-calorie malnutrition, severe 05/29/2022   PVC's (premature ventricular contractions)    PVD 09/22/2008   Qualifier: Diagnosis of  By: Vikki Ports     Rotator cuff arthropathy of right shoulder 09/27/2022   Status post coronary artery stent placement    TOBACCO ABUSE 05/29/2009   Qualifier: Diagnosis of  By: Meryl Crutch RN, BSN, Doreatha Lew    Type 2  diabetes mellitus with other circulatory complications (HCC) 07/22/2022   Type II diabetes mellitus (HCC)    Unstable angina (HCC) 10/16/2017    Past Surgical History:  Procedure Laterality Date   AORTA - BILATERAL FEMORAL ARTERY BYPASS GRAFT Bilateral 1992   APPENDECTOMY     BACK SURGERY     COLECTOMY WITH COLOSTOMY CREATION/HARTMANN PROCEDURE N/A 05/26/2022   Procedure: COLECTOMY WITH COLOSTOMY CREATION/HARTMANN PROCEDURE;  Surgeon: Diamantina Monks, MD;  Location: [facility] OR;  Service: General;  Laterality: N/A;   CORONARY ANGIOPLASTY WITH STENT PLACEMENT     taxus stent  placed into his right coronary artery; stent placed to the LAD.     CORONARY STENT INTERVENTION N/A 10/16/2017   Procedure: CORONARY STENT INTERVENTION;  Surgeon: Kathleene Hazel, MD;  Location: [facility] INVASIVE CV LAB;  Service: Cardiovascular;  Laterality: N/A;   FEMORAL-FEMORAL BYPASS GRAFT  07/2009   LAPAROTOMY N/A  05/26/2022   Procedure: EXPLORATORY LAPAROTOMY;  Surgeon: Diamantina Monks, MD;  Location: [facility] OR;  Service: General;  Laterality: N/A;   LUMBAR MICRODISCECTOMY  12/31/08   RIGHT/LEFT HEART CATH AND CORONARY ANGIOGRAPHY N/A 10/16/2017   Procedure: RIGHT/LEFT HEART CATH AND CORONARY ANGIOGRAPHY;  Surgeon: Kathleene Hazel, MD;  Location: [facility] INVASIVE CV LAB;  Service: Cardiovascular;  Laterality: N/A;   SHOULDER OPEN ROTATOR CUFF REPAIR Left       Current Medications  Current Outpatient Medications  Medication Sig Dispense Refill   amLODipine (NORVASC) 5 MG tablet Take 1 tablet (5 mg total) by mouth daily. 90 tablet 3   aspirin EC 81 MG tablet Take 1 tablet (81 mg total) by mouth daily. Swallow whole. 90 tablet 3   carvedilol (COREG) 12.5 MG tablet Take 1 tablet (12.5 mg total) by mouth 2 (two) times daily with a meal. 180 tablet 3   cetirizine (ZYRTEC) 10 MG tablet Take 1 tablet (10 mg total) by mouth daily. 90 tablet 0   glipiZIDE (GLUCOTROL) 10 MG tablet TAKE 1 TABLET(10 MG) BY MOUTH TWICE DAILY 60 tablet 6   glucose blood test strip Use to check blood sugars up to 3 times a day DX E11.9 100 each 12   JARDIANCE 10 MG TABS tablet Take 10 mg by mouth daily.     nitroGLYCERIN (NITROSTAT) 0.4 MG SL tablet Place 0.4 mg under the tongue every 5 (five) minutes as needed for chest pain.     rivaroxaban (XARELTO) 20 MG TABS tablet Take 1 tablet (20 mg total) by mouth daily with supper. 30 tablet 3   rosuvastatin (CRESTOR) 40 MG tablet Take 1 tablet (40 mg total) by mouth daily. 90 tablet 1   sacubitril-valsartan (ENTRESTO) 49-51 MG Take 1 tablet by mouth 2 (two) times daily. 60 tablet 3   sacubitril-valsartan (ENTRESTO) 49-51 MG Take 1 tablet by mouth 2 (two) times daily. 84 tablet 0   Semaglutide, 1 MG/DOSE, 4 MG/3ML SOPN Inject 1 mg into the skin once a week. For one month then increase to 2mg  weekly. 3 mL 0   No current facility-administered medications for this visit.     Allergies Lisinopril   Social History Social History   Tobacco Use   Smoking status: Former    Current packs/day: 0.00    Average packs/day: 0.5 packs/day for 56.0 years (28.0 ttl pk-yrs)    Types: Cigars, Cigarettes    Start date: 17    Quit date: 2022    Years since quitting: 2.8   Smokeless tobacco: Former    Types: Sports administrator  Substance Use Topics   Alcohol use: Yes    Comment: 10/16/2017 "1 beer q 2-3 months"     Review of Systems Review of Systems  Constitutional:  Negative for chills, fever, malaise/fatigue and weight loss.  Eyes:  Negative for blurred vision and double vision.  Respiratory:  Negative for cough, hemoptysis, shortness of breath and wheezing.   Cardiovascular:  Negative for chest pain, palpitations and leg swelling.  Gastrointestinal:  Negative for abdominal pain, constipation, diarrhea, heartburn, nausea and vomiting.  Genitourinary:  Negative for frequency, hematuria and urgency.  Musculoskeletal:  Negative for myalgias.  Skin:  Negative for itching and rash.  Neurological:  Negative for dizziness, weakness and headaches.  Endo/Heme/Allergies:  Negative for polydipsia.  Psychiatric/Behavioral:  Negative for depression. The patient is not nervous/anxious.      Physical Exam:      Body mass index is 27.53 kg/m. BP 120/70   Pulse 77   Temp 98 F (36.7 C)   Resp 16   Ht 5\' 7"  (1.702 m)   Wt 175 lb 12.8 oz (79.7 kg)   SpO2 96%   BMI 27.53 kg/m   Physical Exam Constitutional:      Appearance: Normal appearance. He is not ill-appearing.  HENT:     Head: Normocephalic and atraumatic.     Right Ear: Tympanic membrane, ear canal and external ear normal.     Left Ear: Tympanic membrane, ear canal and external ear normal.     Nose: Nose normal. No congestion or rhinorrhea.     Mouth/Throat:     Mouth: Mucous membranes are dry.     Pharynx: Oropharynx is clear. No oropharyngeal exudate or posterior oropharyngeal erythema.  Eyes:     General:  No scleral icterus.    Conjunctiva/sclera: Conjunctivae normal.     Pupils: Pupils are equal, round, and reactive to light.  Neck:     Vascular: No carotid bruit.  Cardiovascular:     Rate and Rhythm: Normal rate and regular rhythm.     Pulses: Normal pulses.     Heart sounds: No murmur heard.    No friction rub. No gallop.  Pulmonary:     Effort: Pulmonary effort is normal. No respiratory distress.     Breath sounds: No wheezing, rhonchi or rales.  Abdominal:     General: Abdomen is flat. Bowel sounds are normal. There is no distension.     Palpations: Abdomen is soft.     Tenderness: There is no abdominal tenderness.  Musculoskeletal:     Cervical back: Neck supple. No tenderness.     Right lower leg: No edema.     Left lower leg: No edema.  Lymphadenopathy:     Cervical: No cervical adenopathy.  Skin:    General: Skin is warm and dry.     Findings: No rash.  Neurological:     General: No focal deficit present.     Mental Status: He is alert and oriented to person, place, and time.  Psychiatric:        Mood and Affect: Mood normal.        Behavior: Behavior normal.      Assessment:      Poorly controlled diabetes mellitus (HCC)  Chronic combined systolic (congestive) and diastolic (congestive) heart failure (HCC)  Atrial fibrillation, unspecified type (HCC)  Coronary artery disease involving native coronary artery of native heart without angina pectoris  Benign prostatic hyperplasia with urinary frequency  Hypercholesterolemia  Rotator cuff arthropathy of right  shoulder  PVD (peripheral vascular disease) (HCC)  History of bladder cancer  Essential hypertension  BMI 27.0-27.9,adult  Atherosclerosis of native artery of lower extremity with intermittent claudication, unspecified laterality (HCC)  Coronary artery disease involving native coronary artery of native heart with unstable angina pectoris (HCC)  Ischemic cardiomyopathy  PAF (paroxysmal atrial  fibrillation) (HCC)  PVD  Type 2 diabetes mellitus with other circulatory complications (HCC)  Diverticulitis    Plan:     During the course of the visit the patient was educated and counseled about appropriate screening and preventive services including:   Pneumococcal vaccine  Influenza vaccine Colorectal cancer screening Advanced directives: discussed  Diet review for nutrition referral? Yes ____  Not Indicated __X__   Patient Instructions (the written plan) was given to the patient.  Atherosclerosis of native artery of extremity with intermittent claudication (HCC) We will continue with risk factor modification as well as his statin and ASA.  I want him to continue to exercise and be active.  Chronic combined systolic (congestive) and diastolic (congestive) heart failure (HCC) There is no evidence of volume overload.  He remains on entresto and jardiance as well as coreg.  Coronary artery disease involving native coronary artery of native heart with unstable angina pectoris [facility]) He has no evidence of angina.  We will continue secondary prevention.  Essential hypertension His BP is controlled.  We will continue to monitor.  Ischemic cardiomyopathy As above.  PAF (paroxysmal atrial fibrillation) (HCC) His A. Fib is rate controlled.  WE will continue on anticoagulation with xarelto.  PVD Plan as above.  Type 2 diabetes mellitus with other circulatory complications (HCC) We will refer him to optometry for his yearly eye exam and send for urine studies.  His foot exam was normal.  Poorly controlled T2 diabetes mellitus (HCC) We will check a HgBA1c on him and give further instructions.  Rotator cuff arthropathy of right shoulder He will followup with urology as indicated.  Benign prostatic hyperplasia with urinary frequency WE will check a PSA today.  BMI 27.0-27.9,adult I want him to eat healthy, exercise and lose weight.  Diverticulitis He had recent  diverticulitis and wants a reversal for his ostomy.  He has to have a controlled HgBa1c.  History of bladder cancer HE has not seen Dr. [name] in a while.  We will refer him back to urology.  Hypercholesterolemia We will check his FLP today with goal LDL <55.   Prevention Health maintenance discussed.  We will give him a flu vaccine.  We will obtain some yearly labs.  Medicare Attestation I have personally reviewed: The patient's medical and social history Their use of alcohol, tobacco or illicit drugs Their current medications and supplements The patient's functional ability including ADLs,fall risks, home safety risks, cognitive, and hearing and visual impairment Diet and physical activities Evidence for depression or mood disorders  The patient's weight, height, and BMI have been recorded in the chart.  I have made referrals, counseling, and provided education to the patient based on review of the above and I have provided the patient with a written personalized care plan for preventive services.     Crist Fat, MD   02/23/2023

## 2023-02-23 DIAGNOSIS — I4891 Unspecified atrial fibrillation: Secondary | ICD-10-CM | POA: Insufficient documentation

## 2023-02-23 DIAGNOSIS — I739 Peripheral vascular disease, unspecified: Secondary | ICD-10-CM

## 2023-02-23 DIAGNOSIS — N401 Enlarged prostate with lower urinary tract symptoms: Secondary | ICD-10-CM | POA: Insufficient documentation

## 2023-02-23 DIAGNOSIS — Z6827 Body mass index (BMI) 27.0-27.9, adult: Secondary | ICD-10-CM | POA: Insufficient documentation

## 2023-02-23 DIAGNOSIS — Z8551 Personal history of malignant neoplasm of bladder: Secondary | ICD-10-CM | POA: Insufficient documentation

## 2023-02-23 HISTORY — DX: Benign prostatic hyperplasia with lower urinary tract symptoms: N40.1

## 2023-02-23 HISTORY — DX: Body mass index (BMI) 27.0-27.9, adult: Z68.27

## 2023-02-23 HISTORY — DX: Peripheral vascular disease, unspecified: I73.9

## 2023-02-23 HISTORY — DX: Personal history of malignant neoplasm of bladder: Z85.51

## 2023-02-23 NOTE — Assessment & Plan Note (Signed)
We will continue with risk factor modification as well as his statin and ASA.  I want him to continue to exercise and be active.

## 2023-02-23 NOTE — Assessment & Plan Note (Signed)
We will check a HgBA1c on him and give further instructions.

## 2023-02-23 NOTE — Assessment & Plan Note (Signed)
We will refer him to optometry for his yearly eye exam and send for urine studies.  His foot exam was normal.

## 2023-02-23 NOTE — Assessment & Plan Note (Signed)
We will check his FLP today with goal LDL <55. 

## 2023-02-23 NOTE — Assessment & Plan Note (Signed)
HE has not seen Dr. [name] in a while.  We will refer him back to urology.

## 2023-02-23 NOTE — Assessment & Plan Note (Signed)
He will followup with urology as indicated.

## 2023-02-23 NOTE — Assessment & Plan Note (Signed)
His A. Fib is rate controlled.  WE will continue on anticoagulation with xarelto.

## 2023-02-23 NOTE — Assessment & Plan Note (Signed)
WE will check a PSA today.

## 2023-02-23 NOTE — Assessment & Plan Note (Signed)
He had recent diverticulitis and wants a reversal for his ostomy.  He has to have a controlled HgBa1c.

## 2023-02-23 NOTE — Assessment & Plan Note (Signed)
His BP is controlled.  We will continue to monitor. 

## 2023-02-23 NOTE — Assessment & Plan Note (Signed)
Plan as above.  

## 2023-02-23 NOTE — Assessment & Plan Note (Signed)
I want him to eat healthy, exercise and lose weight. 

## 2023-02-23 NOTE — Assessment & Plan Note (Signed)
As above.

## 2023-02-23 NOTE — Assessment & Plan Note (Signed)
There is no evidence of volume overload.  He remains on entresto and jardiance as well as coreg.

## 2023-02-23 NOTE — Assessment & Plan Note (Signed)
He has no evidence of angina.  We will continue secondary prevention.

## 2023-02-24 LAB — CBC WITH DIFFERENTIAL/PLATELET
Basophils Absolute: 0 10*3/uL (ref 0.0–0.2)
Basos: 0 %
EOS (ABSOLUTE): 0.1 10*3/uL (ref 0.0–0.4)
Eos: 1 %
Hematocrit: 45.4 % (ref 37.5–51.0)
Hemoglobin: 14.1 g/dL (ref 13.0–17.7)
Immature Grans (Abs): 0 10*3/uL (ref 0.0–0.1)
Immature Granulocytes: 0 %
Lymphocytes Absolute: 1.6 10*3/uL (ref 0.7–3.1)
Lymphs: 20 %
MCH: 27.4 pg (ref 26.6–33.0)
MCHC: 31.1 g/dL — ABNORMAL LOW (ref 31.5–35.7)
MCV: 88 fL (ref 79–97)
Monocytes Absolute: 0.4 10*3/uL (ref 0.1–0.9)
Monocytes: 6 %
Neutrophils Absolute: 5.6 10*3/uL (ref 1.4–7.0)
Neutrophils: 73 %
Platelets: 238 10*3/uL (ref 150–450)
RBC: 5.14 x10E6/uL (ref 4.14–5.80)
RDW: 14.3 % (ref 11.6–15.4)
WBC: 7.7 10*3/uL (ref 3.4–10.8)

## 2023-02-24 LAB — CMP14 + ANION GAP
ALT: 14 [IU]/L (ref 0–44)
AST: 19 [IU]/L (ref 0–40)
Albumin: 4.5 g/dL (ref 3.8–4.8)
Alkaline Phosphatase: 87 [IU]/L (ref 44–121)
Anion Gap: 17 mmol/L (ref 10.0–18.0)
BUN/Creatinine Ratio: 15 (ref 10–24)
BUN: 19 mg/dL (ref 8–27)
Bilirubin Total: 0.5 mg/dL (ref 0.0–1.2)
CO2: 22 mmol/L (ref 20–29)
Calcium: 9.6 mg/dL (ref 8.6–10.2)
Chloride: 102 mmol/L (ref 96–106)
Creatinine, Ser: 1.3 mg/dL — ABNORMAL HIGH (ref 0.76–1.27)
Globulin, Total: 2.6 g/dL (ref 1.5–4.5)
Glucose: 128 mg/dL — ABNORMAL HIGH (ref 70–99)
Potassium: 4.6 mmol/L (ref 3.5–5.2)
Sodium: 141 mmol/L (ref 134–144)
Total Protein: 7.1 g/dL (ref 6.0–8.5)
eGFR: 56 mL/min/{1.73_m2} — ABNORMAL LOW (ref >59)

## 2023-02-24 LAB — LIPID PANEL
Chol/HDL Ratio: 3.4 ratio (ref 0.0–5.0)
Cholesterol, Total: 139 mg/dL (ref 100–199)
HDL: 41 mg/dL (ref >39)
LDL Chol Calc (NIH): 76 mg/dL (ref 0–99)
Triglycerides: 125 mg/dL (ref 0–149)
VLDL Cholesterol Cal: 22 mg/dL (ref 5–40)

## 2023-02-24 LAB — PSA: Prostate Specific Ag, Serum: 1.4 ng/mL (ref 0.0–4.0)

## 2023-02-24 LAB — MICROALBUMIN / CREATININE URINE RATIO
Creatinine, Urine: 49.5 mg/dL
Microalb/Creat Ratio: 73 mg/g{creat} — ABNORMAL HIGH (ref 0–29)
Microalbumin, Urine: 36.3 ug/mL

## 2023-02-24 LAB — HEMOGLOBIN A1C
Est. average glucose Bld gHb Est-mCnc: 214 mg/dL
Hgb A1c MFr Bld: 9.1 % — ABNORMAL HIGH (ref 4.8–5.6)

## 2023-02-24 LAB — TSH: TSH: 2.76 u[IU]/mL (ref 0.450–4.500)

## 2023-02-24 NOTE — Telephone Encounter (Signed)
Spoke with patient and advise Fleet Contras from [employer] is not on DPR. Must add her if approved to speak to this company. I provided Fleet Contras information if the patient wish to speak to this company. He verbalized understanding after explaining our DPR process.

## 2023-02-28 ENCOUNTER — Telehealth: Payer: Self-pay

## 2023-02-28 NOTE — Telephone Encounter (Signed)
Patient notified of  [employer] approved Jardiance until 04/17/2024. Approval#[id number].   Messages left on VM and my chart

## 2023-03-01 ENCOUNTER — Encounter: Payer: Self-pay | Admitting: Cardiology

## 2023-03-01 ENCOUNTER — Ambulatory Visit: Payer: Medicare Other | Attending: Cardiology | Admitting: Cardiology

## 2023-03-01 VITALS — BP 130/70 | HR 75 | Ht 67.0 in | Wt 175.8 lb

## 2023-03-01 DIAGNOSIS — I739 Peripheral vascular disease, unspecified: Secondary | ICD-10-CM | POA: Diagnosis not present

## 2023-03-01 DIAGNOSIS — I5042 Chronic combined systolic (congestive) and diastolic (congestive) heart failure: Secondary | ICD-10-CM | POA: Diagnosis not present

## 2023-03-01 DIAGNOSIS — E1159 Type 2 diabetes mellitus with other circulatory complications: Secondary | ICD-10-CM | POA: Diagnosis not present

## 2023-03-01 DIAGNOSIS — I25110 Atherosclerotic heart disease of native coronary artery with unstable angina pectoris: Secondary | ICD-10-CM | POA: Diagnosis not present

## 2023-03-01 MED ORDER — EZETIMIBE 10 MG PO TABS: 10.0000 mg | ORAL_TABLET | Freq: Every day | ORAL | 3 refills | Status: AC

## 2023-03-01 NOTE — Telephone Encounter (Signed)
Patient notified today in person

## 2023-03-01 NOTE — Progress Notes (Signed)
Cardiology Office Note:    Date:  03/01/2023   ID:  [name], DOB 12/27/1945, MRN [id number]  PCP:  Crist Fat, MD  Cardiologist:  Gypsy Balsam, MD    Referring MD: Leonia Reader, Barbara Cower, MD   Chief Complaint  Patient presents with   Follow-up    History of Present Illness:    [name] is a 77 y.o. male  with extremely complex past medical history that include paroxysmal atrial fibrillation, COPD, history of ischemic cardiomyopathy, essential hypertension, hyperlipidemia.  He comes today to my office to talk about incoming surgery for his reverse colostomy as well as potential shoulder surgery ischemic history is very complex initial event happened in 1992 when he suffered from inferior wall myocardial infarction, PTCA and stent to RCA has been placed at that time 2000 for anterior wall MI required stent to LAD in 2006 he had another myocardial infarction low inferior wall with Taxus stent placed in the right coronary artery and shortly after that another cardiac catheterization has been performed which showed total occlusion of right coronary artery.  Last cardiac catheterization done in July 2019 showing severe stenosis of the proximal LAD treated with drug-eluting stent right coronary artery was chronically occluded echocardiogram from October 2019 showed ejection fraction 25 to 30%.  Last elation of his ejection fraction was done in March of this year which showed ejection fraction 25 to 30%, he also got a stress test done at the time which show old MI but no ischemia. Comes today to months for follow-up overall doing fine.  Denies have any chest pain tightness squeezing pressure burning chest he said he got his shop at home about 150 yards from his house going there is uphill and he said he is to stop twice now he stops maybe once on the way in.  Denies have any chest pain tightness squeezing pressure burning chest overall doing better.  Past Medical History:  Diagnosis Date    Acute low back pain 01/12/2015   Acute pain of both shoulders 07/22/2022   AKI (acute kidney injury) (HCC) 05/24/2022   Atherosclerosis of native arteries of the extremities with intermittent claudication 08/13/2013   Backache 02/08/2013   Bladder cancer (HCC)    resection x3   Chronic combined systolic (congestive) and diastolic (congestive) heart failure (HCC) 07/22/2022   Chronic right shoulder pain 09/02/2022   Chronic systolic CHF (congestive heart failure) (HCC)    Cigarette smoker    Coronary artery disease    status post DMI RX Taxus stent RCA 2006 with susequent Stent LAD and subsequent  stent thrombosis RCA unable to be opened 2006 -neg mv 10/2008, 10/16/17 ISR to pLAD with PTCA/DES, CTO of RCA with collaterals, EF 25%   Coronary artery disease involving native coronary artery of native heart with unstable angina pectoris (HCC)    Current moderate episode of major depressive disorder without prior episode (HCC) 03/28/2022   Diverticulitis 06/10/2022   Diverticulitis large intestine 05/11/2021   Diverticulitis of large intestine with abscess 05/24/2022   Diverticulitis of sigmoid colon 05/11/2021   DM2 (diabetes mellitus, type 2) (HCC) 02/09/2022   status post bilateral aortobifemoral bypass [id number] with recent fem to fembypass April  2011 per DR. Early     Essential hypertension 07/22/2022   Hypercholesterolemia 10/28/2022   HYPERLIPIDEMIA-MIXED 09/22/2008   Qualifier: Diagnosis of  By: Vikki Ports     Hypothyroidism 05/24/2022   Ischemic cardiomyopathy    ejection fraction of 40-45%   Lethargy  03/28/2022   Lumbar spinal stenosis    Myocardial infarction (HCC)    "I've had 4" (10/16/2017)   Need for prophylactic vaccination and inoculation against influenza 03/28/2022   NSVT (nonsustained ventricular tachycardia) (HCC)    PAF (paroxysmal atrial fibrillation) (HCC) 02/09/2022   Paroxysmal atrial fibrillation (HCC)    Poorly controlled T2 diabetes mellitus (HCC) 09/27/2022    Protein-calorie malnutrition, severe 05/29/2022   PVC's (premature ventricular contractions)    PVD 09/22/2008   Qualifier: Diagnosis of  By: Vikki Ports     Rotator cuff arthropathy of right shoulder 09/27/2022   Status post coronary artery stent placement    TOBACCO ABUSE 05/29/2009   Qualifier: Diagnosis of  By: Meryl Crutch, RN, BSN, Doreatha Lew    Type 2 diabetes mellitus with other circulatory complications (HCC) 07/22/2022   Type II diabetes mellitus (HCC)    Unstable angina (HCC) 10/16/2017    Past Surgical History:  Procedure Laterality Date   AORTA - BILATERAL FEMORAL ARTERY BYPASS GRAFT Bilateral 1992   APPENDECTOMY     BACK SURGERY     COLECTOMY WITH COLOSTOMY CREATION/HARTMANN PROCEDURE N/A 05/26/2022   Procedure: COLECTOMY WITH COLOSTOMY CREATION/HARTMANN PROCEDURE;  Surgeon: Diamantina Monks, MD;  Location: [facility] OR;  Service: General;  Laterality: N/A;   CORONARY ANGIOPLASTY WITH STENT PLACEMENT     taxus stent  placed into his right coronary artery; stent placed to the LAD.     CORONARY STENT INTERVENTION N/A 10/16/2017   Procedure: CORONARY STENT INTERVENTION;  Surgeon: Kathleene Hazel, MD;  Location: [facility] INVASIVE CV [facility];  Service: Cardiovascular;  Laterality: N/A;   FEMORAL-FEMORAL BYPASS GRAFT  07/2009   LAPAROTOMY N/A 05/26/2022   Procedure: EXPLORATORY LAPAROTOMY;  Surgeon: Diamantina Monks, MD;  Location: [facility] OR;  Service: General;  Laterality: N/A;   LUMBAR MICRODISCECTOMY  12/31/08   RIGHT/LEFT HEART CATH AND CORONARY ANGIOGRAPHY N/A 10/16/2017   Procedure: RIGHT/LEFT HEART CATH AND CORONARY ANGIOGRAPHY;  Surgeon: Kathleene Hazel, MD;  Location: [facility] INV[facility];  Service: Cardiovascular;  Laterality: N/A;   SHOULDER OPEN ROTATOR CUFF REPAIR Left     Current Medications: Current Meds  Medication Sig   amLODipine (NORVASC) 5 MG tablet Take 1 tablet (5 mg total) by mouth daily.   aspirin EC 81 MG tablet Take 1 tablet (81 mg total) by mouth daily. Swallow  whole.   carvedilol (COREG) 12.5 MG tablet Take 1 tablet (12.5 mg total) by mouth 2 (two) times daily with a meal.   cetirizine (ZYRTEC) 10 MG tablet Take 1 tablet (10 mg total) by mouth daily.   glipiZIDE (GLUCOTROL) 10 MG tablet TAKE 1 TABLET(10 MG) BY MOUTH TWICE DAILY (Patient taking differently: Take 10 mg by mouth 2 (two) times daily before a meal.)   glucose blood test strip Use to check blood sugars up to 3 times a day DX E11.9 (Patient taking differently: 1 each by Other route daily. Use to check blood sugars up to 3 times a day DX E11.9)   JARDIANCE 10 MG TABS tablet Take 10 mg by mouth daily.   nitroGLYCERIN (NITROSTAT) 0.4 MG SL tablet Place 0.4 mg under the tongue every 5 (five) minutes as needed for chest pain.   rivaroxaban (XARELTO) 20 MG TABS tablet Take 1 tablet (20 mg total) by mouth daily with supper.   rosuvastatin (CRESTOR) 40 MG tablet Take 1 tablet (40 mg total) by mouth daily.   sacubitril-valsartan (ENTRESTO) 49-51 MG Take 1 tablet by mouth 2 (two) times  daily.   sacubitril-valsartan (ENTRESTO) 49-51 MG Take 1 tablet by mouth 2 (two) times daily.   Semaglutide, 1 MG/DOSE, 4 MG/3ML SOPN Inject 1 mg into the skin once a week. For one month then increase to 2mg  weekly.     Allergies:   Lisinopril   Social History   Socioeconomic History   Marital status: Divorced    Spouse name: Not on file   Number of children: 1   Years of education: Not on file   Highest education level: Not on file  Occupational History   Occupation: retired    Associate Professor: RETIRED    Comment: truck driver  Tobacco Use   Smoking status: Former    Current packs/day: 0.00    Average packs/day: 0.5 packs/day for 56.0 years (28.0 ttl pk-yrs)    Types: Cigars, Cigarettes    Start date: 1966    Quit date: 2022    Years since quitting: 2.8   Smokeless tobacco: Former    Types: Engineer, drilling   Vaping status: Never Used  Substance and Sexual Activity   Alcohol use: Yes    Comment: 10/16/2017  "1 beer q 2-3 months"   Drug use: Never   Sexual activity: Not Currently  Other Topics Concern   Not on file  Social History Narrative   Lives with girlfriend in [place].     Social Determinants of Health   Financial Resource Strain: Not on file  Food Insecurity: No Food Insecurity (05/24/2022)   Hunger Vital Sign    Worried About Running Out of Food in the Last Year: Never true    Ran Out of Food in the Last Year: Never true  Transportation Needs: No Transportation Needs (05/24/2022)   PRAPARE - Administrator, Civil Service (Medical): No    Lack of Transportation (Non-Medical): No  Physical Activity: Not on file  Stress: Not on file  Social Connections: Not on file     Family History: The patient's family history includes Cardiomyopathy in his mother; Coronary artery disease in his father and unknown relative; Diabetes in his father, sister, and unknown relative; Heart attack in his father and sister; Heart disease in his father, mother, and sister; Hyperlipidemia in his father, mother, and sister; Stroke in his father. ROS:   Please see the history of present illness.    All 14 point review of systems negative except as described per history of present illness  EKGs/Labs/Other Studies Reviewed:         Recent Labs: 06/01/2022: Magnesium 1.9 09/06/2022: NT-Pro BNP 3,744 02/22/2023: ALT 14; BUN 19; Creatinine, Ser 1.30; Hemoglobin 14.1; Platelets 238; Potassium 4.6; Sodium 141; TSH 2.760  Recent Lipid Panel    Component Value Date/Time   CHOL 139 02/22/2023 1600   TRIG 125 02/22/2023 1600   HDL 41 02/22/2023 1600   CHOLHDL 3.4 02/22/2023 1600   CHOLHDL 6.1 CALC 11/07/2006 0958   VLDL 20 11/07/2006 0958   LDLCALC 76 02/22/2023 1600    Physical Exam:    VS:  BP 130/70 (BP Location: Left Arm, Patient Position: Sitting)   Pulse 75   Ht 5\' 7"  (1.702 m)   Wt 175 lb 12.8 oz (79.7 kg)   SpO2 94%   BMI 27.53 kg/m     Wt Readings from Last 3 Encounters:   03/01/23 175 lb 12.8 oz (79.7 kg)  02/22/23 175 lb 12.8 oz (79.7 kg)  11/29/22 169 lb 9.6 oz (76.9 kg)     GEN:  Well nourished, well developed in no acute distress HEENT: Normal NECK: No JVD; No carotid bruits LYMPHATICS: No lymphadenopathy CARDIAC: RRR, no murmurs, no rubs, no gallops RESPIRATORY:  Clear to auscultation without rales, wheezing or rhonchi  ABDOMEN: Soft, non-tender, non-distended MUSCULOSKELETAL:  No edema; No deformity  SKIN: Warm and dry LOWER EXTREMITIES: no swelling NEUROLOGIC:  Alert and oriented x 3 PSYCHIATRIC:  Normal affect   ASSESSMENT:    1. Chronic combined systolic (congestive) and diastolic (congestive) heart failure (HCC)   2. Coronary artery disease involving native coronary artery of native heart with unstable angina pectoris (HCC)   3. PVD (peripheral vascular disease) (HCC)   4. Type 2 diabetes mellitus with other circulatory complications (HCC)    PLAN:    In order of problems listed above:  Cardiomyopathy which is ischemic in origin I had a discussion with him again about potential ICD.  He is still reluctant to consider it I think there is a value of repeating his echocardiogram to recheck left ventricle ejection fraction if ejection fraction still less than 35% then we need to reconsider defibrillator. Congestive heart failure.  Compensated on physical exam.  Not candidate for Aldactone because of hyperkalemia.  Hemodynamically stable. Peripheral vascular disease.  Stable. Diabetes I did review K PN which show me still very elevated hemoglobin A1c of 9.1 he understand he is trying to work better on this. Dyslipidemia I did review K PN which show me LDL 76 HDL 41.  He is on Crestor 40.  Will add Zetia to his medical regimen.   Medication Adjustments/Labs and Tests Ordered: Current medicines are reviewed at length with the patient today.  Concerns regarding medicines are outlined above.  No orders of the defined types were placed in this  encounter.  Medication changes: No orders of the defined types were placed in this encounter.   Signed, Georgeanna Lea, MD, [facility] 03/01/2023 8:49 AM    [facility]

## 2023-03-01 NOTE — Patient Instructions (Signed)
Medication Instructions:  Your physician has recommended you make the following change in your medication:   Start Zetia 10 mg daily.  *If you need a refill on your cardiac medications before your next appointment, please call your pharmacy*   Lab Work: None ordered If you have labs (blood work) drawn today and your tests are completely normal, you will receive your results only by: MyChart Message (if you have MyChart) OR A paper copy in the mail If you have any lab test that is abnormal or we need to change your treatment, we will call you to review the results.   Testing/Procedures: Your physician has requested that you have an echocardiogram. Echocardiography is a painless test that uses sound waves to create images of your heart. It provides your doctor with information about the size and shape of your heart and how well your heart's chambers and valves are working. This procedure takes approximately one hour. There are no restrictions for this procedure. Please do NOT wear cologne, perfume, aftershave, or lotions (deodorant is allowed). Please arrive 15 minutes prior to your appointment time.     Follow-Up: At [facility], you and your health needs are our priority.  As part of our continuing mission to provide you with exceptional heart care, we have created designated Provider Care Teams.  These Care Teams include your primary Cardiologist (physician) and Advanced Practice Providers (APPs -  Physician Assistants and Nurse Practitioners) who all work together to provide you with the care you need, when you need it.  We recommend signing up for the patient portal called "MyChart".  Sign up information is provided on this After Visit Summary.  MyChart is used to connect with patients for Virtual Visits (Telemedicine).  Patients are able to view lab/test results, encounter notes, upcoming appointments, etc.  Non-urgent messages can be sent to your provider as well.   To learn more  about what you can do with MyChart, go to [email or web address].    Your next appointment:   6 month(s)  The format for your next appointment:   In Person  Provider:   Gypsy Balsam, MD   Other Instructions Echocardiogram An echocardiogram is a test that uses sound waves (ultrasound) to produce images of the heart. Images from an echocardiogram can provide important information about: Heart size and shape. The size and thickness and movement of your heart's walls. Heart muscle function and strength. Heart valve function or if you have stenosis. Stenosis is when the heart valves are too narrow. If blood is flowing backward through the heart valves (regurgitation). A tumor or infectious growth around the heart valves. Areas of heart muscle that are not working well because of poor blood flow or injury from a heart attack. Aneurysm detection. An aneurysm is a weak or damaged part of an artery wall. The wall bulges out from the normal force of blood pumping through the body. Tell a health care provider about: Any allergies you have. All medicines you are taking, including vitamins, herbs, eye drops, creams, and over-the-counter medicines. Any blood disorders you have. Any surgeries you have had. Any medical conditions you have. Whether you are pregnant or may be pregnant. What are the risks? Generally, this is a safe test. However, problems may occur, including an allergic reaction to dye (contrast) that may be used during the test. What happens before the test? No specific preparation is needed. You may eat and drink normally. What happens during the test? You will take off your clothes  from the waist up and put on a hospital gown. Electrodes or electrocardiogram (ECG)patches may be placed on your chest. The electrodes or patches are then connected to a device that monitors your heart rate and rhythm. You will lie down on a table for an ultrasound exam. A gel will be applied  to your chest to help sound waves pass through your skin. A handheld device, called a transducer, will be pressed against your chest and moved over your heart. The transducer produces sound waves that travel to your heart and bounce back (or "echo" back) to the transducer. These sound waves will be captured in real-time and changed into images of your heart that can be viewed on a video monitor. The images will be recorded on a computer and reviewed by your health care provider. You may be asked to change positions or hold your breath for a short time. This makes it easier to get different views or better views of your heart. In some cases, you may receive contrast through an IV in one of your veins. This can improve the quality of the pictures from your heart. The procedure may vary among health care providers and hospitals.   What can I expect after the test? You may return to your normal, everyday life, including diet, activities, and medicines, unless your health care provider tells you not to do that. Follow these instructions at home: It is up to you to get the results of your test. Ask your health care provider, or the department that is doing the test, when your results will be ready. Keep all follow-up visits. This is important. Summary An echocardiogram is a test that uses sound waves (ultrasound) to produce images of the heart. Images from an echocardiogram can provide important information about the size and shape of your heart, heart muscle function, heart valve function, and other possible heart problems. You do not need to do anything to prepare before this test. You may eat and drink normally. After the echocardiogram is completed, you may return to your normal, everyday life, unless your health care provider tells you not to do that. This information is not intended to replace advice given to you by your health care provider. Make sure you discuss any questions you have with your  health care provider. Document Revised: 11/26/2019 Document Reviewed: 11/26/2019 Elsevier Patient Education  2021 Elsevier Inc.   Important Information About Sugar

## 2023-03-01 NOTE — Addendum Note (Signed)
Addended by: Eleonore Chiquito on: 03/01/2023 09:06 AM   Modules accepted: Orders

## 2023-03-13 ENCOUNTER — Encounter: Payer: Self-pay | Admitting: Student

## 2023-03-13 ENCOUNTER — Ambulatory Visit: Payer: Medicare Other | Admitting: Student

## 2023-03-13 VITALS — BP 120/78 | HR 78 | Temp 98.2°F | Resp 18 | Ht 67.0 in | Wt 177.1 lb

## 2023-03-13 DIAGNOSIS — R5383 Other fatigue: Secondary | ICD-10-CM | POA: Insufficient documentation

## 2023-03-13 DIAGNOSIS — E1159 Type 2 diabetes mellitus with other circulatory complications: Secondary | ICD-10-CM

## 2023-03-13 DIAGNOSIS — R531 Weakness: Secondary | ICD-10-CM | POA: Diagnosis not present

## 2023-03-13 HISTORY — DX: Other fatigue: R53.83

## 2023-03-13 MED ORDER — TRULICITY 1.5 MG/0.5ML ~~LOC~~ SOAJ: 1.5000 mg | SUBCUTANEOUS | 0 refills | Status: DC

## 2023-03-13 MED ORDER — TRULICITY 0.75 MG/0.5ML ~~LOC~~ SOAJ: 0.7500 mg | SUBCUTANEOUS | 0 refills | Status: DC

## 2023-03-13 NOTE — Assessment & Plan Note (Signed)
Due to insurance coverage the patient will need to be switched from Ozempic to Trulicity.  He will use 0.75 mg once weekly then increase to 1.5 mg.  He will continue to monitor fasting blood sugars.  Instructions provided.

## 2023-03-13 NOTE — Patient Instructions (Addendum)
For headaches you can use tylenol as needed.   I will switch Ozempic to Trulicity. This is an injectable once weekly to help control your A1c. We will wait to here back from your insurance.  Please talk to Urology about testosterone levels.   Monitor your symptoms and drink plenty of fluids as you can tolerate.

## 2023-03-13 NOTE — Assessment & Plan Note (Signed)
The patient is requesting B12 and testosterone labs.  I suggest that [name] wait until [name] sees urology for testosterone I strongly believe that the addition of Zetia is causing fatigue.  Labs drawn today: CK, CMP, CBC.  [name] will need to increase his fluid intake and monitor his symptoms.

## 2023-03-13 NOTE — Progress Notes (Signed)
Acute Office Visit  Subjective:     Patient ID: [name], male    DOB: 01-06-1946, 77 y.o.   MRN: [id number]  Chief Complaint  Patient presents with   Weakness, entire body. Had a headache x 3 days.    HPI  [name] is a 77 y.o. male who presents for evaluation of fatigue. Symptoms began around 4 weeks ago.  Associated symptoms include generalized weakness and headaches for 3 days. He denies any sick contacts, chest pain, increased shortness of breath, productive cough or lower extremity edema.  Dr. Ledora Bottcher with cardiology started him on Zetia 40 mg, the patient endorses beginning this on November 18th. Since this time he reports an increase in tiredness along with a dry cough. He has gained 2 lbs since the last visit.  The patients diabetes has been poorly controlled.  His last hemoglobin A1c collected 2 weeks ago is 9.1.  Dr. Leonia Reader started him on Ozempic once weekly injections and he is supposed to be using 2 mg.  The patient also brings his Ozempic injector with him stating that he is out of the dose.  Due to insurance he is unable to afford the medication, he does continue to monitor his diet and checks fasting blood sugars daily.  He denies any complaints.      Review of Systems  Constitutional:  Positive for malaise/fatigue. Negative for weight loss.  Eyes: Negative.   Respiratory:  Positive for cough. Negative for sputum production, shortness of breath and wheezing.   Cardiovascular: Negative.   Gastrointestinal: Negative.   Genitourinary: Negative.   Musculoskeletal: Negative.   Neurological:  Positive for weakness and headaches.        Objective:    BP 120/78 (BP Location: Left Arm, Patient Position: Sitting)   Pulse 78   Temp 98.2 F (36.8 C)   Resp 18   Ht 5\' 7"  (1.702 m)   Wt 177 lb 2 oz (80.3 kg)   SpO2 98%   BMI 27.74 kg/m    Physical Exam Vitals reviewed.  Constitutional:      Appearance: Normal appearance.  Cardiovascular:     Rate  and Rhythm: Normal rate and regular rhythm.     Pulses: Normal pulses.     Heart sounds: Normal heart sounds.  Pulmonary:     Effort: Pulmonary effort is normal.     Breath sounds: Normal breath sounds.  Abdominal:     General: Bowel sounds are normal. There is distension.     Palpations: Abdomen is soft.     Tenderness: There is no abdominal tenderness.  Neurological:     General: No focal deficit present.     Mental Status: He is alert and oriented to person, place, and time.  Psychiatric:        Mood and Affect: Mood normal.        Behavior: Behavior normal.     No results found for any visits on 03/13/23.      Assessment & Plan:   Problem List Items Addressed This Visit     Type 2 diabetes mellitus with other circulatory complications (HCC)    Due to insurance coverage the patient will need to be switched from Ozempic to Trulicity.  He will use 0.75 mg once weekly then increase to 1.5 mg.  He will continue to monitor fasting blood sugars.  Instructions provided.      Relevant Medications   Dulaglutide (TRULICITY) 0.75 MG/0.5ML SOAJ   Dulaglutide (  TRULICITY) 1.5 MG/0.5ML SOAJ (Start on 04/10/2023)   Fatigue - Primary    The patient is requesting B12 and testosterone labs.  I suggest that he wait until he sees urology for testosterone I strongly believe that the addition of Zetia is causing fatigue.  Labs drawn today: CK, CMP, CBC.  He will need to increase his fluid intake and monitor his symptoms.      Relevant Orders   Orthostatic vital signs   Comprehensive metabolic panel   CBC with Diff   CK, total    Meds ordered this encounter  Medications   Dulaglutide (TRULICITY) 0.75 MG/0.5ML SOAJ    Sig: Inject 0.75 mg into the skin once a week for 28 days.    Dispense:  2 mL    Refill:  0   Dulaglutide (TRULICITY) 1.5 MG/0.5ML SOAJ    Sig: Inject 1.5 mg into the skin once a week for 28 days.    Dispense:  2 mL    Refill:  0    No follow-ups on file.  Edwena Blow, NP

## 2023-03-14 DIAGNOSIS — K5732 Diverticulitis of large intestine without perforation or abscess without bleeding: Secondary | ICD-10-CM | POA: Diagnosis not present

## 2023-03-14 DIAGNOSIS — Z933 Colostomy status: Secondary | ICD-10-CM | POA: Diagnosis not present

## 2023-03-14 LAB — CBC WITH DIFFERENTIAL/PLATELET
Basophils Absolute: 0 10*3/uL (ref 0.0–0.2)
Basos: 0 %
EOS (ABSOLUTE): 0.1 10*3/uL (ref 0.0–0.4)
Eos: 1 %
Hematocrit: 44.6 % (ref 37.5–51.0)
Hemoglobin: 14.1 g/dL (ref 13.0–17.7)
Immature Grans (Abs): 0 10*3/uL (ref 0.0–0.1)
Immature Granulocytes: 0 %
Lymphocytes Absolute: 1.5 10*3/uL (ref 0.7–3.1)
Lymphs: 18 %
MCH: 27.3 pg (ref 26.6–33.0)
MCHC: 31.6 g/dL (ref 31.5–35.7)
MCV: 86 fL (ref 79–97)
Monocytes Absolute: 0.6 10*3/uL (ref 0.1–0.9)
Monocytes: 8 %
Neutrophils Absolute: 5.9 10*3/uL (ref 1.4–7.0)
Neutrophils: 73 %
Platelets: 208 10*3/uL (ref 150–450)
RBC: 5.16 x10E6/uL (ref 4.14–5.80)
RDW: 13.9 % (ref 11.6–15.4)
WBC: 8.1 10*3/uL (ref 3.4–10.8)

## 2023-03-14 LAB — COMPREHENSIVE METABOLIC PANEL
ALT: 12 [IU]/L (ref 0–44)
AST: 16 [IU]/L (ref 0–40)
Albumin: 4.1 g/dL (ref 3.8–4.8)
Alkaline Phosphatase: 82 [IU]/L (ref 44–121)
BUN/Creatinine Ratio: 9 — ABNORMAL LOW (ref 10–24)
BUN: 13 mg/dL (ref 8–27)
Bilirubin Total: 0.4 mg/dL (ref 0.0–1.2)
CO2: 19 mmol/L — ABNORMAL LOW (ref 20–29)
Calcium: 9.4 mg/dL (ref 8.6–10.2)
Chloride: 104 mmol/L (ref 96–106)
Creatinine, Ser: 1.46 mg/dL — ABNORMAL HIGH (ref 0.76–1.27)
Globulin, Total: 2.8 g/dL (ref 1.5–4.5)
Glucose: 188 mg/dL — ABNORMAL HIGH (ref 70–99)
Potassium: 4.8 mmol/L (ref 3.5–5.2)
Sodium: 140 mmol/L (ref 134–144)
Total Protein: 6.9 g/dL (ref 6.0–8.5)
eGFR: 49 mL/min/{1.73_m2} — ABNORMAL LOW (ref >59)

## 2023-03-14 LAB — CK: Total CK: 67 U/L (ref 41–331)

## 2023-03-14 NOTE — Progress Notes (Signed)
I have reviewed your lab results. Your kidney functions remains the same and liver function is normal. The complete blood count and CK are both normal. Continue to stay hydrated and monitor symptoms. If weakness continues, contact cardiology about medication zetia.

## 2023-03-14 NOTE — Progress Notes (Signed)
[name], Luetta Nutting, NP  Christianne Dolin, CMA I have reviewed your lab results. Your kidney functions remains the same and liver function is normal. The complete blood count and CK are both normal. Continue to stay hydrated and monitor symptoms. If weakness continues, contact cardiology about medication zetia. Pt informed of results and recommendations.

## 2023-03-21 ENCOUNTER — Ambulatory Visit: Payer: Self-pay | Admitting: Surgery

## 2023-03-21 DIAGNOSIS — K5902 Outlet dysfunction constipation: Secondary | ICD-10-CM

## 2023-03-21 DIAGNOSIS — Z933 Colostomy status: Secondary | ICD-10-CM

## 2023-03-21 DIAGNOSIS — I739 Peripheral vascular disease, unspecified: Secondary | ICD-10-CM | POA: Diagnosis not present

## 2023-03-21 DIAGNOSIS — Z8719 Personal history of other diseases of the digestive system: Secondary | ICD-10-CM

## 2023-03-21 DIAGNOSIS — I25110 Atherosclerotic heart disease of native coronary artery with unstable angina pectoris: Secondary | ICD-10-CM | POA: Diagnosis not present

## 2023-03-21 HISTORY — DX: Colostomy status: Z93.3

## 2023-03-21 HISTORY — DX: Outlet dysfunction constipation: K59.02

## 2023-03-21 HISTORY — DX: Personal history of other diseases of the digestive system: Z87.19

## 2023-03-23 ENCOUNTER — Telehealth: Payer: Self-pay | Admitting: Cardiology

## 2023-03-23 NOTE — Telephone Encounter (Signed)
Caller Fleet Contras) stated patient will need a letter on office letterhead from the doctor which includes the following information:  - Addressed to [employer] stating "the patient cannot afford the $89 co-pay of the Xarelto with me (doctor).  - Coverage gap support program and needs to be reconsidered for [employer].   - Must include patient's annual household income - $21,524.40.   - Must include patient's medication cost - $144/30 day supply  Caller requested the letter be faxed to fax# [phone] as soon as possible.

## 2023-03-28 NOTE — Telephone Encounter (Signed)
Spoke to the patient daughter Aggie Cosier, aware I've attempt to reach out to [place] from prescription line but, after a long wait  I simply hang up. Patient aware I started the appeal process on 02/24/2023 and I don't not have the status at the moment. I advise I will reach out to the foundation for an update.

## 2023-03-28 NOTE — Telephone Encounter (Signed)
Spoke with Sophia at [place], she stated patient is only needing letter stating patient cannot afford the copay from [employer] program. Letter generate and awaiting for doctor signature.

## 2023-03-29 ENCOUNTER — Encounter: Payer: Self-pay | Admitting: Internal Medicine

## 2023-03-29 ENCOUNTER — Ambulatory Visit: Payer: Medicare Other | Admitting: Internal Medicine

## 2023-03-29 VITALS — BP 132/80 | HR 74 | Temp 98.7°F | Resp 18 | Ht 67.0 in | Wt 174.4 lb

## 2023-03-29 DIAGNOSIS — M65341 Trigger finger, right ring finger: Secondary | ICD-10-CM

## 2023-03-29 DIAGNOSIS — E1165 Type 2 diabetes mellitus with hyperglycemia: Secondary | ICD-10-CM | POA: Diagnosis not present

## 2023-03-29 DIAGNOSIS — E7800 Pure hypercholesterolemia, unspecified: Secondary | ICD-10-CM | POA: Diagnosis not present

## 2023-03-29 HISTORY — DX: Trigger finger, right ring finger: M65.341

## 2023-03-29 MED ORDER — INSULIN GLARGINE 100 UNITS/ML SOLOSTAR PEN: PEN_INJECTOR | SUBCUTANEOUS | 2 refills | Status: DC

## 2023-03-29 NOTE — Progress Notes (Signed)
Office Visit  Subjective   Patient ID: [name]   DOB: February 24, 1946   Age: 77 y.o.   MRN: [id number]   Chief Complaint Chief Complaint  Patient presents with   Follow-up     History of Present Illness The patient is a 77 year old Caucasian/White male who returns for a follow-up visit for his T2 diabetes.  My NP saw him about 2 weeks ago where he told her that he could not afford the ozempic.  I wanted him to go go up to 2mg  weekly of ozempic.  She switched him to trulicity but he states this medication was still too expensive for him.   We saw him several months ago where we were doing preoperative clearance.  I decided to start him on ozempic where he was increased to 0.5mg  subcut weekly.  We saw him in 10/2022 and his A1c was 10.1% where I asked him to increase his ozempic to 1mg  weekly for a month, then increase his ozempic to 2mg  weekly thereafter but he never did this.  The patient was diagnosed with T2 diabetes in the 1990's.  He remains on glipizide 10 mg BID, Jardiance 10mg  daily.  He is not walking as much as they would like.[name] He specifically denies unexplained fatigue, palpitations, unexplained abdominal pain, nausea or vomiting or hypoglycemia. He does check blood sugars daily where they range from 130-200.  He came in fasting today in anticipation of lab work. He stopped metformin in the past due to diarrhea.  His last HgBa1c was done on 02/22/2023 and was 9.1%.  He has no complications of diabetic retinopathy, neuropathy, or nephropathy.  He does have PVD and CAD associated with his diabetes.   Again, he did miss his yearly diabetic eye exam this past year.  He is also having problems with contraction of his right hand where he states his 4th finger will not extend when he contracts this.  He states this started 6 months ago.  There is pain when he hears a "pop" when he extends his finger.  The patient also returns today for routine followup on his cholesterol. This past year, his  cholesterol was not fully controlled and about 3 months ago, we increased his crestor from 20mg  to 40mg  daily.  He saw cardiology on 03/01/2023 and noted that his recent yearly labs showed his LDL was still not controlled with his history of CAD and diabetes.  He started him on zetia 10mg  daily at that time.  Overall, he states he is doing well and is without any complaints or problems at this time. He specifically denies abdominal pain, nausea, vomiting, diarrhea, myalgias, and fatigue.  He remains on dietary management as well as crestor 40mg  at bedtime and zetia 10mg  daily. He is fasting in anticipation for labs today.     Past Medical History Past Medical History:  Diagnosis Date   Acute low back pain 01/12/2015   Acute pain of both shoulders 07/22/2022   AKI (acute kidney injury) (HCC) 05/24/2022   Atherosclerosis of native arteries of the extremities with intermittent claudication 08/13/2013   Backache 02/08/2013   Bladder cancer (HCC)    resection x3   Chronic combined systolic (congestive) and diastolic (congestive) heart failure (HCC) 07/22/2022   Chronic right shoulder pain 09/02/2022   Chronic systolic CHF (congestive heart failure) (HCC)    Cigarette smoker    Coronary artery disease    status post DMI RX Taxus stent RCA 2006 with susequent Stent  LAD and subsequent  stent thrombosis RCA unable to be opened 2006 -neg mv 10/2008, 10/16/17 ISR to pLAD with PTCA/DES, CTO of RCA with collaterals, EF 25%   Coronary artery disease involving native coronary artery of native heart with unstable angina pectoris (HCC)    Current moderate episode of major depressive disorder without prior episode (HCC) 03/28/2022   Diverticulitis 06/10/2022   Diverticulitis large intestine 05/11/2021   Diverticulitis of large intestine with abscess 05/24/2022   Diverticulitis of sigmoid colon 05/11/2021   DM2 (diabetes mellitus, type 2) (HCC) 02/09/2022   status post bilateral aortobifemoral bypass [id number]  with recent fem to fembypass April  2011 per DR. Early     Essential hypertension 07/22/2022   Hypercholesterolemia 10/28/2022   HYPERLIPIDEMIA-MIXED 09/22/2008   Qualifier: Diagnosis of  By: Vikki Ports     Hypothyroidism 05/24/2022   Ischemic cardiomyopathy    ejection fraction of 40-45%   Lethargy 03/28/2022   Lumbar spinal stenosis    Myocardial infarction (HCC)    "I've had 4" (10/16/2017)   Need for prophylactic vaccination and inoculation against influenza 03/28/2022   NSVT (nonsustained ventricular tachycardia) (HCC)    PAF (paroxysmal atrial fibrillation) (HCC) 02/09/2022   Paroxysmal atrial fibrillation (HCC)    Poorly controlled T2 diabetes mellitus (HCC) 09/27/2022   Protein-calorie malnutrition, severe 05/29/2022   PVC's (premature ventricular contractions)    PVD 09/22/2008   Qualifier: Diagnosis of  By: Vikki Ports     Rotator cuff arthropathy of right shoulder 09/27/2022   Status post coronary artery stent placement    TOBACCO ABUSE 05/29/2009   Qualifier: Diagnosis of  By: Meryl Crutch, RN, BSN, Doreatha Lew    Type 2 diabetes mellitus with other circulatory complications (HCC) 07/22/2022   Type II diabetes mellitus (HCC)    Unstable angina (HCC) 10/16/2017     Allergies Allergies  Allergen Reactions   Lisinopril Swelling and Rash    Rash - face and tounge swell     Medications  Current Outpatient Medications:    amLODipine (NORVASC) 5 MG tablet, Take 1 tablet (5 mg total) by mouth daily., Disp: 90 tablet, Rfl: 3   aspirin EC 81 MG tablet, Take 1 tablet (81 mg total) by mouth daily. Swallow whole., Disp: 90 tablet, Rfl: 3   carvedilol (COREG) 12.5 MG tablet, Take 1 tablet (12.5 mg total) by mouth 2 (two) times daily with a meal., Disp: 180 tablet, Rfl: 3   cetirizine (ZYRTEC) 10 MG tablet, Take 1 tablet (10 mg total) by mouth daily., Disp: 90 tablet, Rfl: 0   ezetimibe (ZETIA) 10 MG tablet, Take 1 tablet (10 mg total) by mouth daily., Disp: 90 tablet, Rfl:  3   glipiZIDE (GLUCOTROL) 10 MG tablet, TAKE 1 TABLET(10 MG) BY MOUTH TWICE DAILY (Patient taking differently: Take 10 mg by mouth 2 (two) times daily before a meal.), Disp: 60 tablet, Rfl: 6   glucose blood test strip, Use to check blood sugars up to 3 times a day DX E11.9 (Patient taking differently: 1 each by Other route daily. Use to check blood sugars up to 3 times a day DX E11.9), Disp: 100 each, Rfl: 12   JARDIANCE 10 MG TABS tablet, Take 10 mg by mouth daily., Disp: , Rfl:    nitroGLYCERIN (NITROSTAT) 0.4 MG SL tablet, Place 0.4 mg under the tongue every 5 (five) minutes as needed for chest pain., Disp: , Rfl:    rivaroxaban (XARELTO) 20 MG TABS tablet, Take 1 tablet (20 mg total) by mouth  daily with supper., Disp: 30 tablet, Rfl: 3   rosuvastatin (CRESTOR) 40 MG tablet, Take 1 tablet (40 mg total) by mouth daily., Disp: 90 tablet, Rfl: 1   sacubitril-valsartan (ENTRESTO) 49-51 MG, Take 1 tablet by mouth 2 (two) times daily., Disp: 60 tablet, Rfl: 3   sacubitril-valsartan (ENTRESTO) 49-51 MG, Take 1 tablet by mouth 2 (two) times daily., Disp: 84 tablet, Rfl: 0   Review of Systems Review of Systems  Constitutional:  Negative for chills and fever.  Eyes:  Negative for blurred vision and double vision.  Respiratory:  Negative for shortness of breath.   Cardiovascular:  Negative for chest pain, palpitations and leg swelling.  Gastrointestinal:  Negative for abdominal pain, constipation, diarrhea, nausea and vomiting.  Genitourinary:  Negative for frequency.  Musculoskeletal:  Negative for myalgias.  Skin:  Negative for itching and rash.  Neurological:  Negative for dizziness, weakness and headaches.  Endo/Heme/Allergies:  Negative for polydipsia.       Objective:    Vitals BP 132/80   Pulse 74   Temp 98.7 F (37.1 C)   Resp 18   Ht 5\' 7"  (1.702 m)   Wt 174 lb 6.4 oz (79.1 kg)   SpO2 98%   BMI 27.31 kg/m    Physical Examination Physical Exam Constitutional:       Appearance: Normal appearance. He is not ill-appearing.  Cardiovascular:     Rate and Rhythm: Normal rate and regular rhythm.     Pulses: Normal pulses.     Heart sounds: No murmur heard.    No friction rub. No gallop.  Pulmonary:     Effort: Pulmonary effort is normal. No respiratory distress.     Breath sounds: No wheezing, rhonchi or rales.  Abdominal:     General: Abdomen is flat. Bowel sounds are normal. There is no distension.     Palpations: Abdomen is soft.     Tenderness: There is no abdominal tenderness.  Musculoskeletal:     Right lower leg: No edema.     Left lower leg: No edema.  Skin:    General: Skin is warm and dry.     Findings: No rash.  Neurological:     Mental Status: He is alert.        Assessment & Plan:   Inadequately controlled diabetes mellitus (HCC) He cannot afford trulicity or ozempic.  I am going to start him on Lantus 8 Units subcut daily.  He is to increase this by 3 Units every 3rd day until his FSBS are consistently less than 150 and then stay at that dose.  I had a long discussion to explain this to him and gave him written instructions.  Continue on his other diabetic meds.  Hypercholesterolemia Cardiology last month added zetia to his crestor 40mg  daily.  We will check his FLP on his next visit.  Trigger ring finger of right hand We will refer him to ortho for evaluation.    Return in about 3 months (around 06/27/2023).   Crist Fat, MD

## 2023-03-29 NOTE — Assessment & Plan Note (Signed)
Cardiology last month added zetia to his crestor 40mg  daily.  We will check his FLP on his next visit.

## 2023-03-29 NOTE — Assessment & Plan Note (Signed)
He cannot afford trulicity or ozempic.  I am going to start him on Lantus 8 Units subcut daily.  He is to increase this by 3 Units every 3rd day until his FSBS are consistently less than 150 and then stay at that dose.  I had a long discussion to explain this to him and gave him written instructions.  Continue on his other diabetic meds.

## 2023-03-29 NOTE — Assessment & Plan Note (Signed)
We will refer him to ortho for evaluation.

## 2023-03-31 DIAGNOSIS — R8289 Other abnormal findings on cytological and histological examination of urine: Secondary | ICD-10-CM | POA: Diagnosis not present

## 2023-03-31 DIAGNOSIS — N401 Enlarged prostate with lower urinary tract symptoms: Secondary | ICD-10-CM | POA: Diagnosis not present

## 2023-03-31 DIAGNOSIS — C679 Malignant neoplasm of bladder, unspecified: Secondary | ICD-10-CM | POA: Diagnosis not present

## 2023-04-03 ENCOUNTER — Telehealth: Payer: Self-pay

## 2023-04-03 NOTE — Telephone Encounter (Signed)
Letter of medical necessity to appeal patient application with [employer] for Xarelto faxed.

## 2023-04-04 NOTE — Telephone Encounter (Signed)
Requested letter completed and faxed to [facility]

## 2023-04-05 ENCOUNTER — Other Ambulatory Visit: Payer: Medicare Other

## 2023-04-07 NOTE — Telephone Encounter (Signed)
Patient notified application approved. He confirmed Rx received from [facility]. Letter processed t scan

## 2023-04-08 DIAGNOSIS — Z86718 Personal history of other venous thrombosis and embolism: Secondary | ICD-10-CM | POA: Diagnosis not present

## 2023-04-08 DIAGNOSIS — Z7901 Long term (current) use of anticoagulants: Secondary | ICD-10-CM | POA: Diagnosis not present

## 2023-04-08 DIAGNOSIS — R040 Epistaxis: Secondary | ICD-10-CM | POA: Diagnosis not present

## 2023-04-14 ENCOUNTER — Telehealth: Payer: Self-pay

## 2023-04-14 NOTE — Telephone Encounter (Signed)
Unable to reach the patient to inform him application for Xarelto approved. Letter on itt's way.

## 2023-04-26 ENCOUNTER — Telehealth: Payer: Self-pay

## 2023-04-26 NOTE — Telephone Encounter (Signed)
   Pre-operative Risk Assessment    Patient Name: [name]  DOB: Aug 31, 1945 MRN: [id number]   Date of last office visit: 03/01/23 Date of next office visit: TBD   Request for Surgical Clearance    Procedure:   Ostomy reversal  Date of Surgery:  Clearance TBD                                Surgeon:  Elspeth Schultze Surgeon's Group or Practice Name:  [facility] Phone number:  [phone] Fax number:  [phone]   Type of Clearance Requested:   - Medical  - Pharmacy:  Hold Rivaroxaban  (Xarelto )     Type of Anesthesia:  General    Additional requests/questions:    Bonney Ival LOISE Gerome   04/26/2023, 4:53 PM

## 2023-04-27 NOTE — Telephone Encounter (Signed)
 Dr. Bernie,  You saw this patient on 03/01/2023. It looks like you wanted to repeat his echo but it was never completed. Echo in June 2024 showed EF 25-30%. He is now pending ostomy reversal.  Per office protocol, will you please comment on medical clearance? Will he need to complete echo prior to clearance?  Please route your response to P [facility] Preop. I will communicate with requesting office once you have given recommendations.   Thank you!  Barnie Hila, NP

## 2023-04-27 NOTE — Telephone Encounter (Signed)
 Please advise holding Xarelto prior to ostomy reversal.  Thank you!  DW

## 2023-05-01 NOTE — Telephone Encounter (Signed)
 Patient with diagnosis of atrial fibrillation on Xarelto  for anticoagulation.    Procedure:   Ostomy reversal   Date of Surgery:  Clearance TBD   CHA2DS2-VASc Score = 6   This indicates a 9.7% annual risk of stroke. The patient's score is based upon: CHF History: 1 HTN History: 1 Diabetes History: 1 Stroke History: 0 Vascular Disease History: 1 Age Score: 2 Gender Score: 0   CrCl 47 Platelet count 208   Per office protocol, patient can hold Xarelto  for 2 days prior to procedure.   Patient will not need bridging with Lovenox  (enoxaparin ) around procedure.  **This guidance is not considered finalized until pre-operative APP has relayed final recommendations.**

## 2023-05-02 ENCOUNTER — Other Ambulatory Visit: Payer: Self-pay

## 2023-05-02 DIAGNOSIS — Z01810 Encounter for preprocedural cardiovascular examination: Secondary | ICD-10-CM

## 2023-05-02 NOTE — Telephone Encounter (Signed)
 Per Joni Reining , DNP to see notes from Dr. Dulce Sellar. Pt will need echo before being cleared. We will send a message to Dr. Dulce Sellar nurse to schedule echo for preop clearance.   Will update Dr. Michaell Cowing as Lorain Childes.

## 2023-05-02 NOTE — Telephone Encounter (Signed)
 Dr. Dulce Sellar request that this patient have an echocardiogram prior to being cleared.  Please contact Dr. Hulen Shouts office about scheduling this as I cannot do it from preop.  Thank you!

## 2023-05-02 NOTE — Progress Notes (Unsigned)
 [name]

## 2023-05-03 NOTE — Telephone Encounter (Signed)
 Order has been placed.

## 2023-05-10 ENCOUNTER — Telehealth: Payer: Self-pay | Admitting: Cardiology

## 2023-05-10 NOTE — Telephone Encounter (Signed)
Spoke with daughter, per DPR, explained that the Echo needs to be repeated before he can be cleared for surgery per the Pre op team. She verbalized understanding and had no further questions.

## 2023-05-10 NOTE — Telephone Encounter (Signed)
Daughter is questioning why pt is having an Echo again. Please advise

## 2023-05-23 ENCOUNTER — Ambulatory Visit: Payer: Medicare Other | Attending: Cardiology

## 2023-05-23 DIAGNOSIS — Z01810 Encounter for preprocedural cardiovascular examination: Secondary | ICD-10-CM | POA: Diagnosis not present

## 2023-05-23 LAB — ECHOCARDIOGRAM COMPLETE
Area-P 1/2: 4.31 cm2
Calc EF: 45.5 %
S' Lateral: 4.9 cm
Single Plane A2C EF: 45.3 %
Single Plane A4C EF: 50.1 %

## 2023-05-23 MED ORDER — PERFLUTREN LIPID MICROSPHERE
1.0000 mL | INTRAVENOUS | Status: AC | PRN
  Administered 2023-05-23: 10 mL via INTRAVENOUS

## 2023-05-24 ENCOUNTER — Ambulatory Visit: Payer: Medicare Other | Admitting: Internal Medicine

## 2023-05-24 ENCOUNTER — Encounter: Payer: Self-pay | Admitting: Internal Medicine

## 2023-05-24 VITALS — BP 130/82 | HR 55 | Temp 98.6°F | Resp 16 | Ht 67.0 in | Wt 185.0 lb

## 2023-05-24 DIAGNOSIS — G8929 Other chronic pain: Secondary | ICD-10-CM

## 2023-05-24 DIAGNOSIS — E1165 Type 2 diabetes mellitus with hyperglycemia: Secondary | ICD-10-CM

## 2023-05-24 DIAGNOSIS — M25512 Pain in left shoulder: Secondary | ICD-10-CM

## 2023-05-24 DIAGNOSIS — I10 Essential (primary) hypertension: Secondary | ICD-10-CM | POA: Diagnosis not present

## 2023-05-24 HISTORY — DX: Other chronic pain: G89.29

## 2023-05-24 MED ORDER — CYCLOBENZAPRINE HCL 10 MG PO TABS: 10.0000 mg | ORAL_TABLET | Freq: Three times a day (TID) | ORAL | 0 refills | Status: DC | PRN

## 2023-05-24 NOTE — Assessment & Plan Note (Signed)
 His BP looks good today.  We will continue his current medications.

## 2023-05-24 NOTE — Assessment & Plan Note (Signed)
 He is now on lantus  and hopefully his A1c is under better control.  We will check his HgBA1c today.  Continue his other meds.  He denies any problems with hypoglycemia.

## 2023-05-24 NOTE — Assessment & Plan Note (Addendum)
 I am going to refer her back to Dr. Larnell in ortho.  He did not have surgery on his right shoulder and he now states his left shoulder hurts worse and he has muscle spasms.  We will start him on a short course of cyclobenzaprine  and get him back in to see Dr. Larnell.  Warnings and precautions regarding cyclobenzaprine  were discussed.

## 2023-05-24 NOTE — Progress Notes (Signed)
 Office Visit  Subjective   Patient ID: [name]   DOB: 09/30/1945   Age: 78 y.o.   MRN: [id number]   Chief Complaint Chief Complaint  Patient presents with   Follow-up     History of Present Illness The patient is a 78 year old Caucasian/White male who returns for a follow-up visit for his T2 diabetes.  On his last visit, his diabetes was not controlled and I asked him to increase his ozempic  to 2mg  once a week.  We instead started him on lantus  instead of ozempic .  Again, he was diagnosed with T2 diabetes in the 1990's.  He remains on glipizide  10 mg BID, Jardiance  10mg  daily and Lantus  30 Units subcut daily.  He is not walking as much as they would like. He specifically denies unexplained fatigue, palpitations, unexplained abdominal pain, nausea or vomiting or hypoglycemia. He does check blood sugars daily where they range from 120-200.  He came in fasting today in anticipation of lab work. He stopped metformin in the past due to diarrhea.  His last HgBa1c was done 3 months ago and was 9.1%.  He has no complications of diabetic retinopathy, neuropathy, or nephropathy.  He does have PVD and CAD associated with his diabetes.   Again, he did miss his yearly diabetic eye exam this past year.  [name] was seen last year for preoperative clearance for right arm pain where ortho wanted to perform a right total should replacement.  We did not give him medical clearance at that due to his diabetes not being controlled.  Today, he states his left shoulder is worse than his right shoulder.  I saw him on 09/02/2022 where he followed up after an ER visit on 08/31/2022 with right shoulder pain.  He has a known history of bilateral shoulder pain in his shoulders that started while he was in the hospital with his admission for diverticulitis.  He hit his right arm on a car door prior to that hospitailzation where he had a large hematoma of his biceps of his right arm.  The patient decided to take xarelto   every other day after that.  I saw him on 07/22/2022 and wanted to obtain shoulder xrays which were done on 07/25/2022 and his right shoulder showed soft tissue calcification adjacent to the humeral head suggestive of calcific tendiopathy and he had AC joint and glenohumeral degenerative changes.  His left shoulder xray showed irregularity of the distal clavicle most consistent with remote traumatic change with widening of the [facility] joint and glenohumeral degenerative change.  We did refer him to Dr. Larnell where the patient tells me he had a kenalog  injection in his right shoulder.  He had this injection about a week before he presented to the ER on 08/31/2022 where he had continued complaints of right shoulder pain and swelling.  They did repeat xrays of his shoulder and did a CT scan of his right shoulder that showed soft tissue swelling which was suggestive of a hematoma and an infectious etiology could not be ruled out.  He had a minimal ESR elevation but normal CRP and no white count.  They contacted Dr. Larnell and he recommended a CT scan which showed a shoulder effusion and possible myositis vs. Intramuscular hemorrhage.  He recommended a steriod taper and analgesics as needed and to followup with him and myself in the clinic.  He recommended him to get a MRI of his shoulder.  He stated during followup with me  that his pain had worsened since his ER visit. He admitted at that time that he never did start the prednisone pack and he wanted to talk to me before he started this.  I asked him to start the prednisone pack and I felt at that time he could have had a hemearthrosis as the patient was on a blood thinner at that time.  He had a followup appointment with ortho already scheduled on 09/06/2022.  His MRI of his right shoulder on 09/13/2022 showed a full thickness full width retracted rupture of the supraspinatus and subscapularis tendons.  There was full thickness nearly full width treating of the infraspinatus with  severe supraspinatus, infraspinatus and subscapularis atrophy.  There is medial dislocation of the intraarticular segment of the long head of the biceps with a glenohumeral joint effusion communicating with the subacromial subdeltoid bursa with extensive synovitis and moderate degenerative AC joint arthropathy.  He followed up with ortho on 09/06/2022 and they decided for this surgery after reviewing his MRI and they wanted him to have medical and cardiac clearance.  He went for his preoperative labs on 09/07/2022 where he had an EKG that showed sinus rhythm with PVC's.  He was noted to have an elevated HgBA1c of 8.9% at that time.    workup, his HgBA1c was 8.9% on 09/07/2022.   The patient is a 78 year old Caucasian/White male who presents for a follow-up evaluation of hypertension.   Since his last visit, there has been no problems.  The patient has been checking his blood pressure at home.  His BP diary shows his systolic runs in the 120-130's.  The patient's current medications include: coreg  12.5mg  BID, amlodipine  5mg  daily, and entresto  49/51mg  BID.  The patient has been tolerating his medications well. The patient denies any dizziness, lightheadness, chest pain, and shortness of breath. He reports there have been no other symptoms noted.       Past Medical History Past Medical History:  Diagnosis Date   Acute low back pain 01/12/2015   Acute pain of both shoulders 07/22/2022   AKI (acute kidney injury) (HCC) 05/24/2022   Atherosclerosis of native arteries of the extremities with intermittent claudication 08/13/2013   Backache 02/08/2013   Bladder cancer (HCC)    resection x3   Chronic combined systolic (congestive) and diastolic (congestive) heart failure (HCC) 07/22/2022   Chronic right shoulder pain 09/02/2022   Chronic systolic CHF (congestive heart failure) (HCC)    Cigarette smoker    Coronary artery disease    status post DMI RX Taxus stent RCA 2006 with susequent Stent LAD and  subsequent  stent thrombosis RCA unable to be opened 2006 -neg mv 10/2008, 10/16/17 ISR to pLAD with PTCA/DES, CTO of RCA with collaterals, EF 25%   Coronary artery disease involving native coronary artery of native heart with unstable angina pectoris (HCC)    Current moderate episode of major depressive disorder without prior episode (HCC) 03/28/2022   Diverticulitis 06/10/2022   Diverticulitis large intestine 05/11/2021   Diverticulitis of large intestine with abscess 05/24/2022   Diverticulitis of sigmoid colon 05/11/2021   DM2 (diabetes mellitus, type 2) (HCC) 02/09/2022   status post bilateral aortobifemoral bypass [id number] with recent fem to fembypass April  2011 per DR. Early     Essential hypertension 07/22/2022   Hypercholesterolemia 10/28/2022   HYPERLIPIDEMIA-MIXED 09/22/2008   Qualifier: Diagnosis of  By: Jaramillo, Luz     Hypothyroidism 05/24/2022   Ischemic cardiomyopathy    ejection fraction of  40-45%   Lethargy 03/28/2022   Lumbar spinal stenosis    Myocardial infarction (HCC)    I've had 4 (10/16/2017)   Need for prophylactic vaccination and inoculation against influenza 03/28/2022   NSVT (nonsustained ventricular tachycardia) (HCC)    PAF (paroxysmal atrial fibrillation) (HCC) 02/09/2022   Paroxysmal atrial fibrillation (HCC)    Poorly controlled T2 diabetes mellitus (HCC) 09/27/2022   Protein-calorie malnutrition, severe 05/29/2022   PVC's (premature ventricular contractions)    PVD 09/22/2008   Qualifier: Diagnosis of  By: Justina Kos     Rotator cuff arthropathy of right shoulder 09/27/2022   Status post coronary artery stent placement    TOBACCO ABUSE 05/29/2009   Qualifier: Diagnosis of  By: Velinda, RN, BSN, Avelina CROME    Type 2 diabetes mellitus with other circulatory complications (HCC) 07/22/2022   Type II diabetes mellitus (HCC)    Unstable angina (HCC) 10/16/2017     Allergies Allergies  Allergen Reactions   Lisinopril  Swelling and Rash    Rash -  face and tounge swell     Medications  Current Outpatient Medications:    amLODipine  (NORVASC ) 5 MG tablet, Take 1 tablet (5 mg total) by mouth daily., Disp: 90 tablet, Rfl: 3   aspirin  EC 81 MG tablet, Take 1 tablet (81 mg total) by mouth daily. Swallow whole., Disp: 90 tablet, Rfl: 3   carvedilol  (COREG ) 12.5 MG tablet, Take 1 tablet (12.5 mg total) by mouth 2 (two) times daily with a meal., Disp: 180 tablet, Rfl: 3   cetirizine  (ZYRTEC ) 10 MG tablet, Take 1 tablet (10 mg total) by mouth daily., Disp: 90 tablet, Rfl: 0   ezetimibe  (ZETIA ) 10 MG tablet, Take 1 tablet (10 mg total) by mouth daily., Disp: 90 tablet, Rfl: 3   glipiZIDE  (GLUCOTROL ) 10 MG tablet, TAKE 1 TABLET(10 MG) BY MOUTH TWICE DAILY (Patient taking differently: Take 10 mg by mouth 2 (two) times daily before a meal.), Disp: 60 tablet, Rfl: 6   glucose blood test strip, Use to check blood sugars up to 3 times a day DX E11.9 (Patient taking differently: 1 each by Other route daily. Use to check blood sugars up to 3 times a day DX E11.9), Disp: 100 each, Rfl: 12   insulin  glargine (LANTUS ) 100 unit/mL SOPN, Start with 8 Units subcut daily.  Increase by 3 Units every 3 days until your FSBS are consistently less than 150, Disp: 6 mL, Rfl: 2   JARDIANCE  10 MG TABS tablet, Take 10 mg by mouth daily., Disp: , Rfl:    nitroGLYCERIN  (NITROSTAT ) 0.4 MG SL tablet, Place 0.4 mg under the tongue every 5 (five) minutes as needed for chest pain., Disp: , Rfl:    rivaroxaban  (XARELTO ) 20 MG TABS tablet, Take 1 tablet (20 mg total) by mouth daily with supper., Disp: 30 tablet, Rfl: 3   rosuvastatin  (CRESTOR ) 40 MG tablet, Take 1 tablet (40 mg total) by mouth daily., Disp: 90 tablet, Rfl: 1   sacubitril -valsartan  (ENTRESTO ) 49-51 MG, Take 1 tablet by mouth 2 (two) times daily., Disp: 60 tablet, Rfl: 3   sacubitril -valsartan  (ENTRESTO ) 49-51 MG, Take 1 tablet by mouth 2 (two) times daily., Disp: 84 tablet, Rfl: 0   Review of Systems Review of  Systems  Constitutional:  Negative for chills and fever.  Eyes:  Negative for blurred vision and double vision.  Respiratory:  Negative for shortness of breath.   Cardiovascular:  Negative for chest pain, palpitations and leg swelling.  Gastrointestinal:  Negative  for abdominal pain, constipation, diarrhea, nausea and vomiting.  Genitourinary:  Negative for frequency.  Musculoskeletal:  Positive for joint pain. Negative for myalgias.  Skin:  Negative for itching and rash.  Neurological:  Negative for dizziness, weakness and headaches.  Endo/Heme/Allergies:  Negative for polydipsia.       Objective:    Vitals BP 130/82   Pulse (!) 55   Temp 98.6 F (37 C)   Resp 16   Ht 5' 7 (1.702 m)   Wt 185 lb (83.9 kg)   SpO2 95%   BMI 28.98 kg/m    Physical Examination Physical Exam Constitutional:      Appearance: Normal appearance. He is not ill-appearing.  Cardiovascular:     Rate and Rhythm: Normal rate and regular rhythm.     Pulses: Normal pulses.     Heart sounds: No murmur heard.    No friction rub. No gallop.  Pulmonary:     Effort: Pulmonary effort is normal. No respiratory distress.     Breath sounds: No wheezing, rhonchi or rales.  Abdominal:     General: Abdomen is flat. Bowel sounds are normal. There is no distension.     Palpations: Abdomen is soft.     Tenderness: There is no abdominal tenderness.  Musculoskeletal:     Right lower leg: No edema.     Left lower leg: No edema.  Skin:    General: Skin is warm and dry.     Findings: No rash.  Neurological:     General: No focal deficit present.     Mental Status: He is alert and oriented to person, place, and time.  Psychiatric:        Mood and Affect: Mood normal.        Behavior: Behavior normal.        Assessment & Plan:   Essential hypertension His BP looks good today.  We will continue his current medications.  Inadequately controlled diabetes mellitus (HCC) He is now on lantus  and hopefully his  A1c is under better control.  We will check his HgBA1c today.  Continue his other meds.  He denies any problems with hypoglycemia.  Chronic left shoulder pain I am going to refer her back to Dr. Larnell in ortho.  He did not have surgery on his right shoulder and he now states his left shoulder hurts worse and he has muscle spasms.  We will start him on a short course of cyclobenzaprine  and get him back in to see Dr. Larnell.  Warnings and precautions regarding cyclobenzaprine  were discussed.    Return in about 3 months (around 08/21/2023).   Selinda Fleeta Finger, MD

## 2023-05-25 LAB — HEMOGLOBIN A1C
Est. average glucose Bld gHb Est-mCnc: 243 mg/dL
Hgb A1c MFr Bld: 10.1 % — ABNORMAL HIGH (ref 4.8–5.6)

## 2023-05-26 ENCOUNTER — Telehealth: Payer: Self-pay

## 2023-05-26 NOTE — Telephone Encounter (Signed)
 Echo Results reviewed with pt as per Dr. Tonja Fray note.  Pt verbalized understanding and had no additional questions. Routed to PCP & Pre Op

## 2023-05-29 NOTE — Telephone Encounter (Signed)
   Primary Cardiologist: Ralene Burger, MD  Chart reviewed as part of pre-operative protocol coverage. Given past medical history and time since last visit, based on ACC/AHA guidelines, [name] would be at acceptable risk for the planned procedure without further cardiovascular testing.   Patient was advised that if he develops new symptoms prior to surgery to contact our office to arrange a follow-up appointment.  He verbalized understanding.  Per office protocol, patient can hold Xarelto  for 2 days prior to procedure.   Patient will not need bridging with Lovenox  (enoxaparin ) around procedure.  I will route this recommendation to the requesting party via Epic fax function and remove from pre-op pool.  Please call with questions.  Gerldine Koch, NP-C 05/29/2023, 7:20 AM [address], [address] Office [phone] Fax [phone]

## 2023-05-30 DIAGNOSIS — M75102 Unspecified rotator cuff tear or rupture of left shoulder, not specified as traumatic: Secondary | ICD-10-CM | POA: Diagnosis not present

## 2023-05-30 DIAGNOSIS — M12812 Other specific arthropathies, not elsewhere classified, left shoulder: Secondary | ICD-10-CM | POA: Diagnosis not present

## 2023-05-30 NOTE — Progress Notes (Signed)
 Call him and tell him that his diabetes has worsened. He needs to check his blood sugars 2-3 times a day before he eats. Increase his lantus  to 34 Units daily and increase by 3 Units every 3 days until his FSBS are less than 150 consistently.  Called patient, left message to return call.

## 2023-06-05 ENCOUNTER — Other Ambulatory Visit: Payer: Self-pay | Admitting: Urology

## 2023-06-05 ENCOUNTER — Other Ambulatory Visit: Payer: Self-pay

## 2023-06-05 MED ORDER — JARDIANCE 10 MG PO TABS: 10.0000 mg | ORAL_TABLET | Freq: Every day | ORAL | 2 refills | Status: DC

## 2023-06-06 ENCOUNTER — Other Ambulatory Visit: Payer: Self-pay

## 2023-06-06 DIAGNOSIS — N401 Enlarged prostate with lower urinary tract symptoms: Secondary | ICD-10-CM | POA: Diagnosis not present

## 2023-06-06 MED ORDER — INSULIN GLARGINE 100 UNITS/ML SOLOSTAR PEN: PEN_INJECTOR | SUBCUTANEOUS | 2 refills | Status: DC

## 2023-06-16 NOTE — Progress Notes (Addendum)
 COVID Vaccine received:  [x]  No []  Yes Date of any COVID positive Test in last 90 days: no PCP - Crist Fat MD Cardiologist - Abelino Derrick MD- [facility]  Chest x-ray -  EKG -  10/06/22 Epic Stress Test -  ECHO - 05/23/23 Epic Cardiac Cath - 10/16/17 Epic  Cardiac clearance- Eligha Bridegroom NP- 05/29/23  Bowel Prep - [x]  No  []   Yes ______  Pacemaker / ICD device [x]  No []  Yes   Spinal Cord Stimulator:[x]  No []  Yes       History of Sleep Apnea? [x]  No []  Yes   CPAP used?- [x]  No []  Yes    Does the patient monitor blood sugar?          []  No [x]  Yes  []  N/A  Patient has: []  NO Hx DM   []  Pre-DM                 []  DM1  [x]   DM2 Does patient have a [employer] or Dexacom? [x]  No []  Yes   Fasting Blood Sugar Ranges- 150's-160's Checks Blood Sugar __2___ times a day  GLP1 agonist / usual dose - No GLP1 instructions:  SGLT-2 inhibitors / usual dose - No SGLT-2 instructions:   Blood Thinner / Instructions:Xarelto- Hold 2 days prior to surgery.Last dose 06/27/23 PM. Aspirin Instructions:ASA 81mg  OK to continue  Comments:   Activity level: Patient is able to climb a flight of stairs without difficulty; [x]  No CP  [x]  No SOB,    Patient can  perform ADLs without assistance.   Anesthesia review: DM, CAD, MI with stents, A-fib, HTN, HgbA1C 10.1 05/24/23  Patient denies shortness of breath, fever, cough and chest pain at PAT appointment.  Patient verbalized understanding and agreement to the Pre-Surgical Instructions that were given to them at this PAT appointment. Patient was also educated of the need to review these PAT instructions again prior to his/her surgery.I reviewed the appropriate phone numbers to call if they have any and questions or concerns.

## 2023-06-16 NOTE — Patient Instructions (Addendum)
 SURGICAL WAITING ROOM VISITATION  Patients having surgery or a procedure may have no more than 2 support people in the waiting area - these visitors may rotate.    Children under the age of 47 must have an adult with them who is not the patient.  Due to an increase in RSV and influenza rates and associated hospitalizations, children ages 67 and under may not visit patients in [facility] hospitals.  Visitors with respiratory illnesses are discouraged from visiting and should remain at home.  If the patient needs to stay at the hospital during part of their recovery, the visitor guidelines for inpatient rooms apply. Pre-op nurse will coordinate an appropriate time for 1 support person to accompany patient in pre-op.  This support person may not rotate.    Please refer to the [facility] website for the visitor guidelines for Inpatients (after your surgery is over and you are in a regular room).       Your procedure is scheduled on: 06/30/23   Report to [facility] Main Entrance    Report to admitting at 5:15 AM   Call this number if you have problems the morning of surgery [phone]   Do not eat food :After Midnight.   After Midnight you may have the following liquids until 4:30 AM DAY OF SURGERY  Water Non-Citrus Juices (without pulp, NO RED-Apple, White grape, White cranberry) Black Coffee (NO MILK/CREAM OR CREAMERS, sugar ok)  Clear Tea (NO MILK/CREAM OR CREAMERS, sugar ok) regular and decaf                             Plain Jell-O (NO RED)                                           Fruit ices (not with fruit pulp, NO RED)                                     Popsicles (NO RED)                                                               Sports drinks like Gatorade (NO RED)              Drink 2 G2 drinks AT 10:00 PM the night before surgery.        The day of surgery:  Drink ONE (1) Pre-Surgery  G2 at 4:30 AM the morning of surgery. Drink in one sitting. Do not  sip.  This drink was given to you during your hospital  pre-op appointment visit. Nothing else to drink after completing the  Pre-Surgery G2.    FOLLOW BOWEL PREP AND ANY ADDITIONAL PRE OP INSTRUCTIONS YOU RECEIVED FROM YOUR SURGEON'S OFFICE!!!     Oral Hygiene is also important to reduce your risk of infection.                                    Remember -  BRUSH YOUR TEETH THE MORNING OF SURGERY WITH YOUR REGULAR TOOTHPASTE  DENTURES WILL BE REMOVED PRIOR TO SURGERY PLEASE DO NOT APPLY "Poly grip" OR ADHESIVES!!!   Stop all vitamins and herbal supplements 7 days before surgery.   Take these medicines the morning of surgery with A SIP OF WATER: Amlodipine, Carvedilol, Cetirizine, Ezetimibe(Zetia), Rosuvastatin  DO NOT TAKE ANY ORAL DIABETIC MEDICATIONS DAY OF YOUR SURGERY Hold Glipizide the morning of surgery. Hold Jardiance for 72 hours prior to surgery. Last dose to be March 10th. Take half of regular dose of Lantus insulin the day before surgery. Take half of regular dose of Lantus insulin the day of surgery.             You may not have any metal on your body including hair pins, jewelry, and body piercing             Do not wear make-up, lotions, powders, perfumes/cologne, or deodorant              Men may shave face and neck.   Do not bring valuables to the hospital. [facility] IS NOT             RESPONSIBLE   FOR VALUABLES.   Contacts, glasses, dentures or bridgework may not be worn into surgery.   Bring small overnight bag day of surgery.   DO NOT BRING YOUR HOME MEDICATIONS TO THE HOSPITAL. PHARMACY WILL DISPENSE MEDICATIONS LISTED ON YOUR MEDICATION LIST TO YOU DURING YOUR ADMISSION IN THE HOSPITAL!    Patients discharged on the day of surgery will not be allowed to drive home.  Someone NEEDS to stay with you for the first 24 hours after anesthesia.   Special Instructions: Bring a copy of your healthcare power of attorney and living will documents the day of  surgery if you haven't scanned them before.              Please read over the following fact sheets you were given: IF YOU HAVE QUESTIONS ABOUT YOUR PRE-OP INSTRUCTIONS PLEASE CALL [phone] Rosey Bath   If you received a COVID test during your pre-op visit  it is requested that you wear a mask when out in public, stay away from anyone that may not be feeling well and notify your surgeon if you develop symptoms. If you test positive for Covid or have been in contact with anyone that has tested positive in the last 10 days please notify you surgeon.    [facility] - Preparing for Surgery Before surgery, you can play an important role.  Because skin is not sterile, your skin needs to be as free of germs as possible.  You can reduce the number of germs on your skin by washing with CHG (chlorahexidine gluconate) soap before surgery.  CHG is an antiseptic cleaner which kills germs and bonds with the skin to continue killing germs even after washing. Please DO NOT use if you have an allergy to CHG or antibacterial soaps.  If your skin becomes reddened/irritated stop using the CHG and inform your nurse when you arrive at Short Stay. Do not shave (including legs and underarms) for at least 48 hours prior to the first CHG shower.  You may shave your face/neck.  Please follow these instructions carefully:  1.  Shower with CHG Soap the night before surgery and the  morning of surgery.  2.  If you choose to wash your hair, wash your hair first as usual with your normal  shampoo.  3.  After you shampoo, rinse your hair and body thoroughly to remove the shampoo.                             4.  Use CHG as you would any other liquid soap.  You can apply chg directly to the skin and wash.  Gently with a scrungie or clean washcloth.  5.  Apply the CHG Soap to your body ONLY FROM THE NECK DOWN.   Do   not use on face/ open                           Wound or open sores. Avoid contact with eyes, ears mouth and    genitals (private parts).                       Wash face,  Genitals (private parts) with your normal soap.             6.  Wash thoroughly, paying special attention to the area where your    surgery  will be performed.  7.  Thoroughly rinse your body with warm water from the neck down.  8.  DO NOT shower/wash with your normal soap after using and rinsing off the CHG Soap.                9.  Pat yourself dry with a clean towel.            10.  Wear clean pajamas.            11.  Place clean sheets on your bed the night of your first shower and do not  sleep with pets. Day of Surgery : Do not apply any lotions/deodorants the morning of surgery.  Please wear clean clothes to the hospital/surgery center.  FAILURE TO FOLLOW THESE INSTRUCTIONS MAY RESULT IN THE CANCELLATION OF YOUR SURGERY  PATIENT SIGNATURE_________________________________  NURSE SIGNATURE__________________________________   Incentive Spirometer  An incentive spirometer is a tool that can help keep your lungs clear and active. This tool measures how well you are filling your lungs with each breath. Taking long deep breaths may help reverse or decrease the chance of developing breathing (pulmonary) problems (especially infection) following: A long period of time when you are unable to move or be active. BEFORE THE PROCEDURE  If the spirometer includes an indicator to show your best effort, your nurse or respiratory therapist will set it to a desired goal. If possible, sit up straight or lean slightly forward. Try not to slouch. Hold the incentive spirometer in an upright position. INSTRUCTIONS FOR USE  Sit on the edge of your bed if possible, or sit up as far as you can in bed or on a chair. Hold the incentive spirometer in an upright position. Breathe out normally. Place the mouthpiece in your mouth and seal your lips tightly around it. Breathe in slowly and as deeply as possible, raising the piston or the ball toward  the top of the column. Hold your breath for 3-5 seconds or for as long as possible. Allow the piston or ball to fall to the bottom of the column. Remove the mouthpiece from your mouth and breathe out normally. Rest for a few seconds and repeat Steps 1 through 7 at least 10 times every 1-2 hours when you are awake. Take your time and  take a few normal breaths between deep breaths. The spirometer may include an indicator to show your best effort. Use the indicator as a goal to work toward during each repetition. After each set of 10 deep breaths, practice coughing to be sure your lungs are clear. If you have an incision (the cut made at the time of surgery), support your incision when coughing by placing a pillow or rolled up towels firmly against it. Once you are able to get out of bed, walk around indoors and cough well. You may stop using the incentive spirometer when instructed by your caregiver.  RISKS AND COMPLICATIONS Take your time so you do not get dizzy or light-headed. If you are in pain, you may need to take or ask for pain medication before doing incentive spirometry. It is harder to take a deep breath if you are having pain. AFTER USE Rest and breathe slowly and easily. It can be helpful to keep track of a log of your progress. Your caregiver can provide you with a simple table to help with this. If you are using the spirometer at home, follow these instructions: SEEK MEDICAL CARE IF:  You are having difficultly using the spirometer. You have trouble using the spirometer as often as instructed. Your pain medication is not giving enough relief while using the spirometer. You develop fever of 100.5 F (38.1 C) or higher. SEEK IMMEDIATE MEDICAL CARE IF:  You cough up bloody sputum that had not been present before. You develop fever of 102 F (38.9 C) or greater. You develop worsening pain at or near the incision site. MAKE SURE YOU:  Understand these instructions. Will watch your  condition. Will get help right away if you are not doing well or get worse.  How to Manage Your Diabetes Before and After Surgery  Why is it important to control my blood sugar before and after surgery? Improving blood sugar levels before and after surgery helps healing and can limit problems. A way of improving blood sugar control is eating a healthy diet by:  Eating less sugar and carbohydrates  Increasing activity/exercise  Talking with your doctor about reaching your blood sugar goals High blood sugars (greater than 180 mg/dL) can raise your risk of infections and slow your recovery, so you will need to focus on controlling your diabetes during the weeks before surgery. Make sure that the doctor who takes care of your diabetes knows about your planned surgery including the date and location.  How do I manage my blood sugar before surgery? Check your blood sugar at least 4 times a day, starting 2 days before surgery, to make sure that the level is not too high or low. Check your blood sugar the morning of your surgery when you wake up and every 2 hours until you get to the Short Stay unit. If your blood sugar is less than 70 mg/dL, you will need to treat for low blood sugar: Do not take insulin. Treat a low blood sugar (less than 70 mg/dL) with  cup of clear juice (cranberry or apple), 4 glucose tablets, OR glucose gel. Recheck blood sugar in 15 minutes after treatment (to make sure it is greater than 70 mg/dL). If your blood sugar is not greater than 70 mg/dL on recheck, call [phone] for further instructions. Report your blood sugar to the short stay nurse when you get to Short Stay.  If you are admitted to the hospital after surgery: Your blood sugar will be checked by  the staff and you will probably be given insulin after surgery (instead of oral diabetes medicines) to make sure you have good blood sugar levels. The goal for blood sugar control after surgery is 80-180  mg/dL.   WHAT DO I DO ABOUT MY DIABETES MEDICATION?  Do not take oral diabetes medicines (pills) the morning of surgery.  THE DAY BEFORE SURGERY, take  half of  Lantus insulin.       THE MORNING OF SURGERY, take  half  of  Lantus  insulin.  Patient Signature:  Date:   Nurse Signature:  Date:   Reviewed and Endorsed by [facility] Patient Education Committee, August 2015

## 2023-06-19 ENCOUNTER — Encounter (HOSPITAL_COMMUNITY)
Admission: RE | Admit: 2023-06-19 | Discharge: 2023-06-19 | Disposition: A | Discharge status: Home / Self Care | Payer: Medicare Other | Source: Ambulatory Visit | Attending: Surgery | Admitting: Surgery

## 2023-06-19 ENCOUNTER — Encounter (HOSPITAL_COMMUNITY): Payer: Self-pay

## 2023-06-19 ENCOUNTER — Other Ambulatory Visit: Payer: Self-pay

## 2023-06-19 VITALS — BP 159/67 | HR 81 | Temp 97.7°F | Resp 18 | Ht 67.0 in | Wt 180.4 lb

## 2023-06-19 DIAGNOSIS — I480 Paroxysmal atrial fibrillation: Secondary | ICD-10-CM | POA: Insufficient documentation

## 2023-06-19 DIAGNOSIS — Z01818 Encounter for other preprocedural examination: Secondary | ICD-10-CM | POA: Diagnosis not present

## 2023-06-19 DIAGNOSIS — I5042 Chronic combined systolic (congestive) and diastolic (congestive) heart failure: Secondary | ICD-10-CM | POA: Diagnosis not present

## 2023-06-19 DIAGNOSIS — Z01812 Encounter for preprocedural laboratory examination: Secondary | ICD-10-CM | POA: Insufficient documentation

## 2023-06-19 DIAGNOSIS — I252 Old myocardial infarction: Secondary | ICD-10-CM | POA: Insufficient documentation

## 2023-06-19 DIAGNOSIS — I2510 Atherosclerotic heart disease of native coronary artery without angina pectoris: Secondary | ICD-10-CM | POA: Insufficient documentation

## 2023-06-19 DIAGNOSIS — I10 Essential (primary) hypertension: Secondary | ICD-10-CM

## 2023-06-19 DIAGNOSIS — I255 Ischemic cardiomyopathy: Secondary | ICD-10-CM | POA: Diagnosis not present

## 2023-06-19 DIAGNOSIS — E119 Type 2 diabetes mellitus without complications: Secondary | ICD-10-CM | POA: Insufficient documentation

## 2023-06-19 DIAGNOSIS — Z933 Colostomy status: Secondary | ICD-10-CM | POA: Insufficient documentation

## 2023-06-19 DIAGNOSIS — Z7901 Long term (current) use of anticoagulants: Secondary | ICD-10-CM | POA: Diagnosis not present

## 2023-06-19 DIAGNOSIS — Z955 Presence of coronary angioplasty implant and graft: Secondary | ICD-10-CM | POA: Insufficient documentation

## 2023-06-19 DIAGNOSIS — I110 Hypertensive heart disease with heart failure: Secondary | ICD-10-CM | POA: Insufficient documentation

## 2023-06-19 DIAGNOSIS — Z794 Long term (current) use of insulin: Secondary | ICD-10-CM | POA: Insufficient documentation

## 2023-06-19 HISTORY — DX: Unspecified osteoarthritis, unspecified site: M19.90

## 2023-06-19 LAB — CBC
HCT: 46.6 % (ref 39.0–52.0)
Hemoglobin: 13.9 g/dL (ref 13.0–17.0)
MCH: 27.3 pg (ref 26.0–34.0)
MCHC: 29.8 g/dL — ABNORMAL LOW (ref 30.0–36.0)
MCV: 91.6 fL (ref 80.0–100.0)
Platelets: 220 10*3/uL (ref 150–400)
RBC: 5.09 MIL/uL (ref 4.22–5.81)
RDW: 14.7 % (ref 11.5–15.5)
WBC: 7 10*3/uL (ref 4.0–10.5)
nRBC: 0 % (ref 0.0–0.2)

## 2023-06-19 LAB — BASIC METABOLIC PANEL
Anion gap: 8 (ref 5–15)
BUN: 26 mg/dL — ABNORMAL HIGH (ref 8–23)
CO2: 23 mmol/L (ref 22–32)
Calcium: 9.1 mg/dL (ref 8.9–10.3)
Chloride: 105 mmol/L (ref 98–111)
Creatinine, Ser: 1.49 mg/dL — ABNORMAL HIGH (ref 0.61–1.24)
GFR, Estimated: 48 mL/min — ABNORMAL LOW (ref >60)
Glucose, Bld: 365 mg/dL — ABNORMAL HIGH (ref 70–99)
Potassium: 4.5 mmol/L (ref 3.5–5.1)
Sodium: 136 mmol/L (ref 135–145)

## 2023-06-19 LAB — GLUCOSE, CAPILLARY: Glucose-Capillary: 370 mg/dL — ABNORMAL HIGH (ref 70–99)

## 2023-06-19 NOTE — Progress Notes (Signed)
 Pt. Here for PST interview. Pt's CBG was370. Pt's daughter stated they had just eaten lunch and the pt. Had bread and a cookie. Pt. Stated he "runs high sometimes." Denied feeling dizzy. No c/o. Accompanied out by daughter.

## 2023-06-20 NOTE — Progress Notes (Signed)
 Anesthesia Chart Review   Case: [id number] Date/Time: 06/30/23 0715   Procedures:      XI ROBOTIC ASSISTED OSTOMY TAKEDOWN     LAPAROTOMY, FOR LYSIS OF ADHESIONS     RIGID PROCTOSCOPY     CYSTOSCOPY with FIREFLY INJECTION   Anesthesia type: General   Pre-op diagnosis: COLOSTOMY FOR COLON RESECTION DESIRE FOR OSTOMY TAKEDOWN   Location: [facility] ROOM 02 / [facility]   Surgeons: Karie Soda, MD; Crist Fat, MD       DISCUSSION:77 y.o. former smoker with h/o CAD, ischemic cardiomyopathy, PAF, DM II, colostomy in place s/p colon resection scheduled for above procedure 06/30/2023 with Dr. Karie Soda and Dr. Berniece Salines.   05/24/2023 A1C 10.1, PCP aware and increased his Lantus on 05/30/2023. Glucose 365 at PAT visit, this is non-fasting. Pt reports fasting glucose 150-160s at home.  Forwarded to surgeon, will evaluate DOS.   Per cardio notes, "1992 when he suffered from inferior wall myocardial infarction, PTCA and stent to RCA has been placed at that time 2000 for anterior wall MI required stent to LAD in 2006 he had another myocardial infarction low inferior wall with Taxus stent placed in the right coronary artery and shortly after that another cardiac catheterization has been performed which showed total occlusion of right coronary artery. Last cardiac catheterization done in July 2019 showing severe stenosis of the proximal LAD treated with drug-eluting stent right coronary artery was chronically occluded echocardiogram from October 2019 showed ejection fraction 25 to 30%."  Most recent echo 05/23/2023 with EF 45-50%.   Per cardiology preoperative evaluation 05/29/2023, "Chart reviewed as part of pre-operative protocol coverage. Given past medical history and time since last visit, based on ACC/AHA guidelines, [name] would be at acceptable risk for the planned procedure without further cardiovascular testing.    Patient was advised that if he develops new symptoms prior to surgery to  contact our office to arrange a follow-up appointment.  He verbalized understanding.   Per office protocol, patient can hold Xarelto for 2 days prior to procedure.   Patient will not need bridging with Lovenox (enoxaparin) around procedure."   VS: BP (!) 159/67   Pulse 81   Temp 36.5 C (Oral)   Resp 18   Ht 5\' 7"  (1.702 m)   Wt 81.8 kg   SpO2 98%   BMI 28.25 kg/m   PROVIDERS: Crist Fat, MD is PCP    LABS: Labs reviewed: Acceptable for surgery. (all labs ordered are listed, but only abnormal results are displayed)  Labs Reviewed  BASIC METABOLIC PANEL - Abnormal; Notable for the following components:      Result Value   Glucose, Bld 365 (*)    BUN 26 (*)    Creatinine, Ser 1.49 (*)    GFR, Estimated 48 (*)    All other components within normal limits  CBC - Abnormal; Notable for the following components:   MCHC 29.8 (*)    All other components within normal limits  GLUCOSE, CAPILLARY - Abnormal; Notable for the following components:   Glucose-Capillary 370 (*)    All other components within normal limits     IMAGES:   EKG:   CV: Echo 05/23/2023  1. Left ventricular ejection fraction, by estimation, is 45 to 50%. The  left ventricle has mildly decreased function. The left ventricle  demonstrates regional wall motion abnormalities (see scoring  diagram/findings for description). There is mild  concentric left ventricular hypertrophy. Left ventricular diastolic  parameters were normal.   2. Right ventricular systolic function is normal. The right ventricular  size is normal.   3. The mitral valve is degenerative. No evidence of mitral valve  regurgitation. No evidence of mitral stenosis.   4. The aortic valve was not well visualized. Aortic valve regurgitation  is not visualized. No aortic stenosis is present.   5. Aortic Normal DTA.   Myocardial Perfusion 10/11/2022   Findings are consistent with infarction and no ischemia.   No ST deviation was  noted.   Left ventricular function is abnormal. Global function is moderately reduced. Nuclear stress EF: 36 %. The left ventricular ejection fraction is moderately decreased (30-44%). End diastolic cavity size is mildly enlarged.   Prior study available for comparison from 09/28/2017. Past Medical History:  Diagnosis Date   Acute low back pain 01/12/2015   Acute pain of both shoulders 07/22/2022   AKI (acute kidney injury) (HCC) 05/24/2022   Arthritis    Atherosclerosis of native arteries of the extremities with intermittent claudication 08/13/2013   Backache 02/08/2013   Bladder cancer (HCC)    resection x3   Chronic combined systolic (congestive) and diastolic (congestive) heart failure (HCC) 07/22/2022   Chronic right shoulder pain 09/02/2022   Chronic systolic CHF (congestive heart failure) (HCC)    Cigarette smoker    Coronary artery disease    status post DMI RX Taxus stent RCA 2006 with susequent Stent LAD and subsequent  stent thrombosis RCA unable to be opened 2006 -neg mv 10/2008, 10/16/17 ISR to pLAD with PTCA/DES, CTO of RCA with collaterals, EF 25%   Coronary artery disease involving native coronary artery of native heart with unstable angina pectoris (HCC)    Current moderate episode of major depressive disorder without prior episode (HCC) 03/28/2022   Diverticulitis 06/10/2022   Diverticulitis large intestine 05/11/2021   Diverticulitis of large intestine with abscess 05/24/2022   Diverticulitis of sigmoid colon 05/11/2021   DM2 (diabetes mellitus, type 2) (HCC) 02/09/2022   status post bilateral aortobifemoral bypass [id number] with recent fem to fembypass April  2011 per DR. Early     Essential hypertension 07/22/2022   Hypercholesterolemia 10/28/2022   HYPERLIPIDEMIA-MIXED 09/22/2008   Qualifier: Diagnosis of  By: Vikki Ports     Hypothyroidism 05/24/2022   Ischemic cardiomyopathy    ejection fraction of 40-45%   Lethargy 03/28/2022   Lumbar spinal stenosis     Myocardial infarction (HCC)    "I've had 4" (10/16/2017)   Need for prophylactic vaccination and inoculation against influenza 03/28/2022   NSVT (nonsustained ventricular tachycardia) (HCC)    PAF (paroxysmal atrial fibrillation) (HCC) 02/09/2022   Paroxysmal atrial fibrillation (HCC)    Poorly controlled T2 diabetes mellitus (HCC) 09/27/2022   Protein-calorie malnutrition, severe 05/29/2022   PVC's (premature ventricular contractions)    PVD 09/22/2008   Qualifier: Diagnosis of  By: Vikki Ports     Rotator cuff arthropathy of right shoulder 09/27/2022   Status post coronary artery stent placement    TOBACCO ABUSE 05/29/2009   Qualifier: Diagnosis of  By: Meryl Crutch, RN, BSN, Doreatha Lew    Type 2 diabetes mellitus with other circulatory complications (HCC) 07/22/2022   Type II diabetes mellitus (HCC)    Unstable angina (HCC) 10/16/2017    Past Surgical History:  Procedure Laterality Date   AORTA - BILATERAL FEMORAL ARTERY BYPASS GRAFT Bilateral 1992   APPENDECTOMY     BACK SURGERY     COLECTOMY WITH COLOSTOMY CREATION/HARTMANN PROCEDURE N/A 05/26/2022  Procedure: COLECTOMY WITH COLOSTOMY CREATION/HARTMANN PROCEDURE;  Surgeon: Diamantina Monks, MD;  Location: [facility] OR;  Service: General;  Laterality: N/A;   CORONARY ANGIOPLASTY WITH STENT PLACEMENT     taxus stent  placed into his right coronary artery; stent placed to the LAD.     CORONARY STENT INTERVENTION N/A 10/16/2017   Procedure: CORONARY STENT INTERVENTION;  Surgeon: Kathleene Hazel, MD;  Location: [facility] INVASIVE CV LAB;  Service: Cardiovascular;  Laterality: N/A;   FEMORAL-FEMORAL BYPASS GRAFT  07/2009   LAPAROTOMY N/A 05/26/2022   Procedure: EXPLORATORY LAPAROTOMY;  Surgeon: Diamantina Monks, MD;  Location: [facility] OR;  Service: General;  Laterality: N/A;   LUMBAR MICRODISCECTOMY  12/31/08   RIGHT/LEFT HEART CATH AND CORONARY ANGIOGRAPHY N/A 10/16/2017   Procedure: RIGHT/LEFT HEART CATH AND CORONARY ANGIOGRAPHY;  Surgeon: Kathleene Hazel, MD;  Location: [facility]AS[facility];  Service: Cardiovascular;  Laterality: N/A;   SHOULDER OPEN ROTATOR CUFF REPAIR Left     MEDICATIONS:  amLODipine (NORVASC) 5 MG tablet   aspirin EC 81 MG tablet   carvedilol (COREG) 12.5 MG tablet   cetirizine (ZYRTEC) 10 MG tablet   cyclobenzaprine (FLEXERIL) 10 MG tablet   ezetimibe (ZETIA) 10 MG tablet   glipiZIDE (GLUCOTROL) 10 MG tablet   insulin glargine (LANTUS) 100 unit/mL SOPN   JARDIANCE 10 MG TABS tablet   nitroGLYCERIN (NITROSTAT) 0.4 MG SL tablet   rivaroxaban (XARELTO) 20 MG TABS tablet   rosuvastatin (CRESTOR) 40 MG tablet   sacubitril-valsartan (ENTRESTO) 49-51 MG   tadalafil (CIALIS) 20 MG tablet   tamsulosin (FLOMAX) 0.4 MG CAPS capsule   No current facility-administered medications for this encounter.      Jodell Cipro Ward, PA-C [facility] Pre-Surgical Testing [phone]

## 2023-06-20 NOTE — Anesthesia Preprocedure Evaluation (Addendum)
 Anesthesia Evaluation  Patient identified by MRN, date of birth, ID band Patient awake    Reviewed: Allergy & Precautions, NPO status , Patient's Chart, lab work & pertinent test results, reviewed documented beta blocker date and time   Airway Mallampati: II  TM Distance: >3 FB Neck ROM: Full    Dental  (+) Dental Advisory Given, Edentulous Upper, Partial Lower   Pulmonary former smoker   Pulmonary exam normal breath sounds clear to auscultation       Cardiovascular hypertension, Pt. on home beta blockers and Pt. on medications + CAD, + Past MI, + Cardiac Stents, + Peripheral Vascular Disease and +CHF  Normal cardiovascular exam Rhythm:Regular Rate:Normal  Most recent echo 05/23/2023 with EF 45-50%   Neuro/Psych  PSYCHIATRIC DISORDERS  Depression    negative neurological ROS     GI/Hepatic Neg liver ROS,,,S/p colon resection, ostomy   Endo/Other  diabetes, Type 2, Oral Hypoglycemic Agents, Insulin DependentHypothyroidism    Renal/GU negative Renal ROS     Musculoskeletal  (+) Arthritis ,    Abdominal   Peds  Hematology  (+) Blood dyscrasia (Xarelto)   Anesthesia Other Findings   Reproductive/Obstetrics                             Anesthesia Physical Anesthesia Plan  ASA: 4  Anesthesia Plan: General   Post-op Pain Management: Tylenol PO (pre-op)*   Induction: Intravenous  PONV Risk Score and Plan: 3 and Dexamethasone, Ondansetron and Treatment may vary due to age or medical condition  Airway Management Planned: Oral ETT  Additional Equipment: Arterial line  Intra-op Plan:   Post-operative Plan: Extubation in OR  Informed Consent: I have reviewed the patients History and Physical, chart, labs and discussed the procedure including the risks, benefits and alternatives for the proposed anesthesia with the patient or authorized representative who has indicated his/her understanding  and acceptance.     Dental advisory given  Plan Discussed with: CRNA  Anesthesia Plan Comments: (See PAT note 06/19/2023  2nd PIV after induction)       Anesthesia Quick Evaluation

## 2023-06-30 ENCOUNTER — Other Ambulatory Visit: Payer: Self-pay

## 2023-06-30 ENCOUNTER — Inpatient Hospital Stay (HOSPITAL_COMMUNITY): Payer: Self-pay | Admitting: Physician Assistant

## 2023-06-30 ENCOUNTER — Inpatient Hospital Stay (HOSPITAL_COMMUNITY)
Admission: RE | Admit: 2023-06-30 | Discharge: 2023-07-03 | DRG: 336 | Disposition: A | Discharge status: Home / Self Care | Payer: Medicare Other | Attending: Surgery | Admitting: Surgery

## 2023-06-30 ENCOUNTER — Encounter (HOSPITAL_COMMUNITY)
Admission: RE | Disposition: A | Discharge status: Home / Self Care | Payer: Self-pay | Source: Home / Self Care | Attending: Surgery

## 2023-06-30 ENCOUNTER — Encounter (HOSPITAL_COMMUNITY): Payer: Self-pay | Admitting: Surgery

## 2023-06-30 ENCOUNTER — Inpatient Hospital Stay (HOSPITAL_COMMUNITY): Admitting: Anesthesiology

## 2023-06-30 DIAGNOSIS — I110 Hypertensive heart disease with heart failure: Secondary | ICD-10-CM | POA: Diagnosis not present

## 2023-06-30 DIAGNOSIS — E1122 Type 2 diabetes mellitus with diabetic chronic kidney disease: Secondary | ICD-10-CM | POA: Diagnosis not present

## 2023-06-30 DIAGNOSIS — K659 Peritonitis, unspecified: Secondary | ICD-10-CM | POA: Diagnosis present

## 2023-06-30 DIAGNOSIS — Z7901 Long term (current) use of anticoagulants: Secondary | ICD-10-CM | POA: Diagnosis not present

## 2023-06-30 DIAGNOSIS — Z87891 Personal history of nicotine dependence: Secondary | ICD-10-CM | POA: Diagnosis not present

## 2023-06-30 DIAGNOSIS — N321 Vesicointestinal fistula: Secondary | ICD-10-CM | POA: Diagnosis not present

## 2023-06-30 DIAGNOSIS — K639 Disease of intestine, unspecified: Secondary | ICD-10-CM | POA: Diagnosis not present

## 2023-06-30 DIAGNOSIS — K5732 Diverticulitis of large intestine without perforation or abscess without bleeding: Secondary | ICD-10-CM | POA: Diagnosis not present

## 2023-06-30 DIAGNOSIS — Z833 Family history of diabetes mellitus: Secondary | ICD-10-CM

## 2023-06-30 DIAGNOSIS — N1832 Chronic kidney disease, stage 3b: Secondary | ICD-10-CM | POA: Insufficient documentation

## 2023-06-30 DIAGNOSIS — E1159 Type 2 diabetes mellitus with other circulatory complications: Secondary | ICD-10-CM | POA: Diagnosis present

## 2023-06-30 DIAGNOSIS — Z433 Encounter for attention to colostomy: Secondary | ICD-10-CM | POA: Diagnosis not present

## 2023-06-30 DIAGNOSIS — E1151 Type 2 diabetes mellitus with diabetic peripheral angiopathy without gangrene: Secondary | ICD-10-CM | POA: Diagnosis not present

## 2023-06-30 DIAGNOSIS — I5042 Chronic combined systolic (congestive) and diastolic (congestive) heart failure: Secondary | ICD-10-CM | POA: Diagnosis not present

## 2023-06-30 DIAGNOSIS — I25110 Atherosclerotic heart disease of native coronary artery with unstable angina pectoris: Secondary | ICD-10-CM | POA: Diagnosis present

## 2023-06-30 DIAGNOSIS — Z7982 Long term (current) use of aspirin: Secondary | ICD-10-CM | POA: Diagnosis not present

## 2023-06-30 DIAGNOSIS — N401 Enlarged prostate with lower urinary tract symptoms: Secondary | ICD-10-CM | POA: Diagnosis not present

## 2023-06-30 DIAGNOSIS — E1165 Type 2 diabetes mellitus with hyperglycemia: Secondary | ICD-10-CM | POA: Diagnosis present

## 2023-06-30 DIAGNOSIS — K435 Parastomal hernia without obstruction or  gangrene: Secondary | ICD-10-CM | POA: Diagnosis not present

## 2023-06-30 DIAGNOSIS — Z83438 Family history of other disorder of lipoprotein metabolism and other lipidemia: Secondary | ICD-10-CM

## 2023-06-30 DIAGNOSIS — Z888 Allergy status to other drugs, medicaments and biological substances status: Secondary | ICD-10-CM

## 2023-06-30 DIAGNOSIS — I255 Ischemic cardiomyopathy: Secondary | ICD-10-CM | POA: Diagnosis not present

## 2023-06-30 DIAGNOSIS — I130 Hypertensive heart and chronic kidney disease with heart failure and stage 1 through stage 4 chronic kidney disease, or unspecified chronic kidney disease: Secondary | ICD-10-CM | POA: Diagnosis not present

## 2023-06-30 DIAGNOSIS — E119 Type 2 diabetes mellitus without complications: Secondary | ICD-10-CM | POA: Diagnosis present

## 2023-06-30 DIAGNOSIS — I2510 Atherosclerotic heart disease of native coronary artery without angina pectoris: Secondary | ICD-10-CM

## 2023-06-30 DIAGNOSIS — K651 Peritoneal abscess: Secondary | ICD-10-CM | POA: Diagnosis not present

## 2023-06-30 DIAGNOSIS — Z955 Presence of coronary angioplasty implant and graft: Secondary | ICD-10-CM

## 2023-06-30 DIAGNOSIS — I509 Heart failure, unspecified: Secondary | ICD-10-CM

## 2023-06-30 DIAGNOSIS — Z8551 Personal history of malignant neoplasm of bladder: Secondary | ICD-10-CM

## 2023-06-30 DIAGNOSIS — E039 Hypothyroidism, unspecified: Secondary | ICD-10-CM | POA: Diagnosis not present

## 2023-06-30 DIAGNOSIS — Z7984 Long term (current) use of oral hypoglycemic drugs: Secondary | ICD-10-CM | POA: Diagnosis not present

## 2023-06-30 DIAGNOSIS — K5720 Diverticulitis of large intestine with perforation and abscess without bleeding: Secondary | ICD-10-CM | POA: Diagnosis present

## 2023-06-30 DIAGNOSIS — K5792 Diverticulitis of intestine, part unspecified, without perforation or abscess without bleeding: Secondary | ICD-10-CM | POA: Diagnosis not present

## 2023-06-30 DIAGNOSIS — I252 Old myocardial infarction: Secondary | ICD-10-CM

## 2023-06-30 DIAGNOSIS — J449 Chronic obstructive pulmonary disease, unspecified: Secondary | ICD-10-CM | POA: Diagnosis not present

## 2023-06-30 DIAGNOSIS — I10 Essential (primary) hypertension: Secondary | ICD-10-CM | POA: Diagnosis present

## 2023-06-30 DIAGNOSIS — Z8249 Family history of ischemic heart disease and other diseases of the circulatory system: Secondary | ICD-10-CM | POA: Diagnosis not present

## 2023-06-30 DIAGNOSIS — Z823 Family history of stroke: Secondary | ICD-10-CM

## 2023-06-30 DIAGNOSIS — I70219 Atherosclerosis of native arteries of extremities with intermittent claudication, unspecified extremity: Secondary | ICD-10-CM | POA: Diagnosis present

## 2023-06-30 DIAGNOSIS — E7800 Pure hypercholesterolemia, unspecified: Secondary | ICD-10-CM | POA: Diagnosis not present

## 2023-06-30 DIAGNOSIS — Z794 Long term (current) use of insulin: Secondary | ICD-10-CM | POA: Diagnosis not present

## 2023-06-30 DIAGNOSIS — I480 Paroxysmal atrial fibrillation: Secondary | ICD-10-CM | POA: Diagnosis present

## 2023-06-30 DIAGNOSIS — K660 Peritoneal adhesions (postprocedural) (postinfection): Secondary | ICD-10-CM | POA: Diagnosis not present

## 2023-06-30 DIAGNOSIS — R351 Nocturia: Secondary | ICD-10-CM | POA: Diagnosis present

## 2023-06-30 DIAGNOSIS — Z79899 Other long term (current) drug therapy: Secondary | ICD-10-CM

## 2023-06-30 HISTORY — PX: LYSIS OF ADHESION: SHX5961

## 2023-06-30 HISTORY — PX: XI ROBOTIC ASSISTED COLOSTOMY TAKEDOWN: SHX6828

## 2023-06-30 HISTORY — PX: PROCTOSCOPY: SHX2266

## 2023-06-30 HISTORY — DX: Diverticulitis of large intestine without perforation or abscess without bleeding: K57.32

## 2023-06-30 LAB — HEMOGLOBIN A1C
Hgb A1c MFr Bld: 10.6 % — ABNORMAL HIGH (ref 4.8–5.6)
Mean Plasma Glucose: 257.52 mg/dL

## 2023-06-30 LAB — GLUCOSE, CAPILLARY
Glucose-Capillary: 164 mg/dL — ABNORMAL HIGH (ref 70–99)
Glucose-Capillary: 173 mg/dL — ABNORMAL HIGH (ref 70–99)
Glucose-Capillary: 189 mg/dL — ABNORMAL HIGH (ref 70–99)
Glucose-Capillary: 184 mg/dL — ABNORMAL HIGH (ref 70–99)
Glucose-Capillary: 163 mg/dL — ABNORMAL HIGH (ref 70–99)
Glucose-Capillary: 176 mg/dL — ABNORMAL HIGH (ref 70–99)

## 2023-06-30 SURGERY — CLOSURE, COLOSTOMY, ROBOT-ASSISTED
Anesthesia: General | Site: Rectum

## 2023-06-30 MED ORDER — HYDROMORPHONE HCL 2 MG/ML IJ SOLN
INTRAMUSCULAR | Status: AC
  Filled 2023-06-30: qty 1

## 2023-06-30 MED ORDER — ONDANSETRON HCL 4 MG PO TABS: 4.0000 mg | ORAL_TABLET | Freq: Four times a day (QID) | ORAL | Status: DC | PRN

## 2023-06-30 MED ORDER — ASPIRIN 81 MG PO TBEC
81.0000 mg | DELAYED_RELEASE_TABLET | Freq: Every day | ORAL | Status: DC
  Administered 2023-07-01 – 2023-07-03 (×3): 81 mg via ORAL
  Filled 2023-06-30 (×3): qty 1

## 2023-06-30 MED ORDER — SACUBITRIL-VALSARTAN 49-51 MG PO TABS
1.0000 | ORAL_TABLET | Freq: Two times a day (BID) | ORAL | Status: DC
  Administered 2023-06-30 – 2023-07-03 (×6): 1 via ORAL
  Filled 2023-06-30 (×6): qty 1

## 2023-06-30 MED ORDER — TAMSULOSIN HCL 0.4 MG PO CAPS
0.8000 mg | ORAL_CAPSULE | Freq: Every evening | ORAL | Status: DC
  Administered 2023-06-30 – 2023-07-02 (×3): 0.8 mg via ORAL
  Filled 2023-06-30 (×3): qty 2

## 2023-06-30 MED ORDER — BUPIVACAINE LIPOSOME 1.3 % IJ SUSP
INTRAMUSCULAR | Status: AC
  Filled 2023-06-30: qty 20

## 2023-06-30 MED ORDER — FENTANYL CITRATE (PF) 100 MCG/2ML IJ SOLN
INTRAMUSCULAR | Status: DC | PRN
  Administered 2023-06-30: 100 ug via INTRAVENOUS

## 2023-06-30 MED ORDER — ALBUTEROL SULFATE HFA 108 (90 BASE) MCG/ACT IN AERS
INHALATION_SPRAY | RESPIRATORY_TRACT | Status: DC | PRN
  Administered 2023-06-30: 6 via RESPIRATORY_TRACT

## 2023-06-30 MED ORDER — 0.9 % SODIUM CHLORIDE (POUR BTL) OPTIME
TOPICAL | Status: DC | PRN
  Administered 2023-06-30: 2000 mL

## 2023-06-30 MED ORDER — ROCURONIUM BROMIDE 10 MG/ML (PF) SYRINGE
PREFILLED_SYRINGE | INTRAVENOUS | Status: AC
  Filled 2023-06-30: qty 10

## 2023-06-30 MED ORDER — FENTANYL CITRATE (PF) 100 MCG/2ML IJ SOLN
INTRAMUSCULAR | Status: AC
  Filled 2023-06-30: qty 2

## 2023-06-30 MED ORDER — LIDOCAINE HCL (CARDIAC) PF 100 MG/5ML IV SOSY
PREFILLED_SYRINGE | INTRAVENOUS | Status: DC | PRN
  Administered 2023-06-30: 80 mg via INTRAVENOUS

## 2023-06-30 MED ORDER — SUGAMMADEX SODIUM 200 MG/2ML IV SOLN
INTRAVENOUS | Status: AC
  Filled 2023-06-30: qty 2

## 2023-06-30 MED ORDER — LIDOCAINE HCL (PF) 2 % IJ SOLN
INTRAMUSCULAR | Status: AC
  Filled 2023-06-30: qty 5

## 2023-06-30 MED ORDER — LORATADINE 10 MG PO TABS
10.0000 mg | ORAL_TABLET | Freq: Every day | ORAL | Status: DC
  Administered 2023-07-01 – 2023-07-03 (×3): 10 mg via ORAL
  Filled 2023-06-30 (×4): qty 1

## 2023-06-30 MED ORDER — MENTHOL 3 MG MT LOZG: 1.0000 | LOZENGE | OROMUCOSAL | Status: DC | PRN

## 2023-06-30 MED ORDER — ONDANSETRON HCL 4 MG/2ML IJ SOLN
INTRAMUSCULAR | Status: AC
Start: 2023-06-30 — End: ?
  Filled 2023-06-30: qty 2

## 2023-06-30 MED ORDER — SODIUM CHLORIDE 0.9 % IV SOLN: 250.0000 mL | INTRAVENOUS | Status: DC | PRN

## 2023-06-30 MED ORDER — SODIUM CHLORIDE 0.9 % IV SOLN: INTRAVENOUS | Status: DC | PRN

## 2023-06-30 MED ORDER — PROCHLORPERAZINE MALEATE 10 MG PO TABS: 10.0000 mg | ORAL_TABLET | Freq: Four times a day (QID) | ORAL | Status: DC | PRN

## 2023-06-30 MED ORDER — HYDROMORPHONE HCL 1 MG/ML IJ SOLN
0.5000 mg | INTRAMUSCULAR | Status: DC | PRN
Start: 2023-06-30 — End: 2023-07-03
  Administered 2023-06-30 – 2023-07-01 (×2): 0.5 mg via INTRAVENOUS
  Administered 2023-07-01 – 2023-07-03 (×4): 1 mg via INTRAVENOUS
  Filled 2023-06-30 (×6): qty 1

## 2023-06-30 MED ORDER — LACTATED RINGERS IV SOLN: Freq: Three times a day (TID) | INTRAVENOUS | Status: AC | PRN

## 2023-06-30 MED ORDER — FENTANYL CITRATE PF 50 MCG/ML IJ SOSY
25.0000 ug | PREFILLED_SYRINGE | INTRAMUSCULAR | Status: DC | PRN
  Administered 2023-06-30: 25 ug via INTRAVENOUS
  Administered 2023-06-30: 50 ug via INTRAVENOUS

## 2023-06-30 MED ORDER — CHLORHEXIDINE GLUCONATE CLOTH 2 % EX PADS: 6.0000 | MEDICATED_PAD | Freq: Once | CUTANEOUS | Status: DC

## 2023-06-30 MED ORDER — INSULIN ASPART 100 UNIT/ML IJ SOLN
0.0000 [IU] | Freq: Every day | INTRAMUSCULAR | Status: DC
  Administered 2023-07-02: 4 [IU] via SUBCUTANEOUS

## 2023-06-30 MED ORDER — SODIUM CHLORIDE 0.9% FLUSH: 3.0000 mL | INTRAVENOUS | Status: DC | PRN

## 2023-06-30 MED ORDER — STERILE WATER FOR INJECTION IJ SOLN
INTRAMUSCULAR | Status: DC | PRN
  Administered 2023-06-30: 15 mL via INTRAMUSCULAR

## 2023-06-30 MED ORDER — CALCIUM POLYCARBOPHIL 625 MG PO TABS
625.0000 mg | ORAL_TABLET | Freq: Two times a day (BID) | ORAL | Status: DC
  Administered 2023-06-30 – 2023-07-03 (×7): 625 mg via ORAL
  Filled 2023-06-30 (×7): qty 1

## 2023-06-30 MED ORDER — TRAMADOL HCL 50 MG PO TABS
ORAL_TABLET | ORAL | Status: AC
  Administered 2023-06-30: 50 mg via ORAL
  Filled 2023-06-30: qty 1

## 2023-06-30 MED ORDER — KCL IN DEXTROSE-NACL 20-5-0.45 MEQ/L-%-% IV SOLN
INTRAVENOUS | Status: AC
  Filled 2023-06-30: qty 1000

## 2023-06-30 MED ORDER — ALBUMIN HUMAN 5 % IV SOLN
INTRAVENOUS | Status: AC
  Filled 2023-06-30: qty 250

## 2023-06-30 MED ORDER — ARTIFICIAL TEARS OPHTHALMIC OINT
TOPICAL_OINTMENT | OPHTHALMIC | Status: AC
  Filled 2023-06-30: qty 3.5

## 2023-06-30 MED ORDER — DIPHENHYDRAMINE HCL 50 MG/ML IJ SOLN: 12.5000 mg | Freq: Four times a day (QID) | INTRAMUSCULAR | Status: DC | PRN

## 2023-06-30 MED ORDER — ALBUMIN HUMAN 5 % IV SOLN: INTRAVENOUS | Status: DC | PRN

## 2023-06-30 MED ORDER — PROPOFOL 10 MG/ML IV BOLUS
INTRAVENOUS | Status: DC | PRN
  Administered 2023-06-30: 120 mg via INTRAVENOUS

## 2023-06-30 MED ORDER — STERILE WATER FOR INJECTION IJ SOLN
INTRAMUSCULAR | Status: AC
  Filled 2023-06-30: qty 10

## 2023-06-30 MED ORDER — BUPIVACAINE-EPINEPHRINE (PF) 0.25% -1:200000 IJ SOLN
INTRAMUSCULAR | Status: DC | PRN
  Administered 2023-06-30: 80 mL

## 2023-06-30 MED ORDER — PHENYLEPHRINE HCL-NACL 20-0.9 MG/250ML-% IV SOLN
INTRAVENOUS | Status: DC | PRN
  Administered 2023-06-30: 40 ug/min via INTRAVENOUS

## 2023-06-30 MED ORDER — VASOPRESSIN 20 UNIT/ML IV SOLN
INTRAVENOUS | Status: DC | PRN
Start: 2023-06-30 — End: 2023-06-30
  Administered 2023-06-30: 1 [IU] via INTRAVENOUS

## 2023-06-30 MED ORDER — CARVEDILOL 12.5 MG PO TABS
12.5000 mg | ORAL_TABLET | Freq: Once | ORAL | Status: AC
  Administered 2023-06-30: 12.5 mg via ORAL

## 2023-06-30 MED ORDER — FENTANYL CITRATE PF 50 MCG/ML IJ SOSY
PREFILLED_SYRINGE | INTRAMUSCULAR | Status: AC
  Administered 2023-06-30: 25 ug via INTRAVENOUS
  Filled 2023-06-30: qty 1

## 2023-06-30 MED ORDER — ALUM & MAG HYDROXIDE-SIMETH 200-200-20 MG/5ML PO SUSP: 30.0000 mL | Freq: Four times a day (QID) | ORAL | Status: DC | PRN

## 2023-06-30 MED ORDER — PHENYLEPHRINE 80 MCG/ML (10ML) SYRINGE FOR IV PUSH (FOR BLOOD PRESSURE SUPPORT)
PREFILLED_SYRINGE | INTRAVENOUS | Status: DC | PRN
  Administered 2023-06-30: 160 ug via INTRAVENOUS
  Administered 2023-06-30: 80 ug via INTRAVENOUS
  Administered 2023-06-30 (×2): 160 ug via INTRAVENOUS

## 2023-06-30 MED ORDER — HYDROMORPHONE HCL 1 MG/ML IJ SOLN
INTRAMUSCULAR | Status: AC
  Administered 2023-06-30: 0.5 mg via INTRAVENOUS
  Filled 2023-06-30: qty 1

## 2023-06-30 MED ORDER — GABAPENTIN 100 MG PO CAPS
200.0000 mg | ORAL_CAPSULE | Freq: Every day | ORAL | Status: DC
  Administered 2023-06-30 – 2023-07-02 (×2): 200 mg via ORAL
  Filled 2023-06-30 (×3): qty 2

## 2023-06-30 MED ORDER — ENSURE PRE-SURGERY PO LIQD: 592.0000 mL | Freq: Once | ORAL | Status: DC

## 2023-06-30 MED ORDER — EPHEDRINE SULFATE-NACL 50-0.9 MG/10ML-% IV SOSY
PREFILLED_SYRINGE | INTRAVENOUS | Status: DC | PRN
  Administered 2023-06-30 (×2): 10 mg via INTRAVENOUS

## 2023-06-30 MED ORDER — BUPIVACAINE-EPINEPHRINE (PF) 0.25% -1:200000 IJ SOLN
INTRAMUSCULAR | Status: AC
  Filled 2023-06-30: qty 60

## 2023-06-30 MED ORDER — LACTATED RINGERS IR SOLN
Status: DC | PRN
  Administered 2023-06-30: 2000 mL

## 2023-06-30 MED ORDER — NITROGLYCERIN 0.4 MG SL SUBL: 0.4000 mg | SUBLINGUAL_TABLET | SUBLINGUAL | Status: DC | PRN

## 2023-06-30 MED ORDER — PHENOL 1.4 % MT LIQD: 2.0000 | OROMUCOSAL | Status: DC | PRN

## 2023-06-30 MED ORDER — ALVIMOPAN 12 MG PO CAPS
12.0000 mg | ORAL_CAPSULE | Freq: Two times a day (BID) | ORAL | Status: DC
  Administered 2023-07-01 – 2023-07-03 (×5): 12 mg via ORAL
  Filled 2023-06-30 (×5): qty 1

## 2023-06-30 MED ORDER — GLIPIZIDE 10 MG PO TABS
10.0000 mg | ORAL_TABLET | Freq: Two times a day (BID) | ORAL | Status: DC
  Administered 2023-06-30 – 2023-07-03 (×6): 10 mg via ORAL
  Filled 2023-06-30 (×6): qty 1

## 2023-06-30 MED ORDER — EZETIMIBE 10 MG PO TABS
10.0000 mg | ORAL_TABLET | Freq: Every day | ORAL | Status: DC
  Administered 2023-06-30 – 2023-07-03 (×4): 10 mg via ORAL
  Filled 2023-06-30 (×4): qty 1

## 2023-06-30 MED ORDER — INSULIN GLARGINE 100 UNIT/ML ~~LOC~~ SOLN
20.0000 [IU] | Freq: Two times a day (BID) | SUBCUTANEOUS | Status: DC
Start: 2023-06-30 — End: 2023-07-03
  Administered 2023-06-30 – 2023-07-03 (×7): 20 [IU] via SUBCUTANEOUS
  Filled 2023-06-30 (×8): qty 0.2

## 2023-06-30 MED ORDER — LACTATED RINGERS IV BOLUS: 1000.0000 mL | Freq: Three times a day (TID) | INTRAVENOUS | Status: AC | PRN

## 2023-06-30 MED ORDER — SALINE SPRAY 0.65 % NA SOLN: 1.0000 | Freq: Four times a day (QID) | NASAL | Status: DC | PRN

## 2023-06-30 MED ORDER — ACETAMINOPHEN 500 MG PO TABS
1000.0000 mg | ORAL_TABLET | ORAL | Status: AC
  Administered 2023-06-30: 1000 mg via ORAL
  Filled 2023-06-30: qty 2

## 2023-06-30 MED ORDER — MAGIC MOUTHWASH
15.0000 mL | Freq: Four times a day (QID) | ORAL | Status: DC | PRN
Start: 2023-06-30 — End: 2023-07-03

## 2023-06-30 MED ORDER — ENSURE PRE-SURGERY PO LIQD: 296.0000 mL | Freq: Once | ORAL | Status: DC

## 2023-06-30 MED ORDER — KETAMINE HCL 50 MG/5ML IJ SOSY
PREFILLED_SYRINGE | INTRAMUSCULAR | Status: AC
  Filled 2023-06-30: qty 5

## 2023-06-30 MED ORDER — CARVEDILOL 12.5 MG PO TABS
ORAL_TABLET | ORAL | Status: AC
  Filled 2023-06-30: qty 1

## 2023-06-30 MED ORDER — ROSUVASTATIN CALCIUM 20 MG PO TABS
40.0000 mg | ORAL_TABLET | Freq: Every evening | ORAL | Status: DC
  Administered 2023-06-30 – 2023-07-02 (×3): 40 mg via ORAL
  Filled 2023-06-30 (×3): qty 2

## 2023-06-30 MED ORDER — SODIUM CHLORIDE 0.9 % IV SOLN
2.0000 g | INTRAVENOUS | Status: AC
  Administered 2023-06-30: 2 g via INTRAVENOUS
  Filled 2023-06-30: qty 2

## 2023-06-30 MED ORDER — MIDAZOLAM HCL 2 MG/2ML IJ SOLN
INTRAMUSCULAR | Status: AC
  Filled 2023-06-30: qty 2

## 2023-06-30 MED ORDER — ENSURE SURGERY PO LIQD
237.0000 mL | Freq: Two times a day (BID) | ORAL | Status: DC
  Administered 2023-07-01 – 2023-07-03 (×4): 237 mL via ORAL

## 2023-06-30 MED ORDER — SUGAMMADEX SODIUM 200 MG/2ML IV SOLN
INTRAVENOUS | Status: DC | PRN
  Administered 2023-06-30: 200 mg via INTRAVENOUS

## 2023-06-30 MED ORDER — INSULIN ASPART 100 UNIT/ML IJ SOLN
INTRAMUSCULAR | Status: AC
  Filled 2023-06-30: qty 1

## 2023-06-30 MED ORDER — ACETAMINOPHEN 500 MG PO TABS
1000.0000 mg | ORAL_TABLET | Freq: Four times a day (QID) | ORAL | Status: DC
  Administered 2023-06-30 – 2023-07-03 (×9): 1000 mg via ORAL
  Filled 2023-06-30 (×10): qty 2

## 2023-06-30 MED ORDER — CARVEDILOL 12.5 MG PO TABS
12.5000 mg | ORAL_TABLET | Freq: Two times a day (BID) | ORAL | Status: DC
  Administered 2023-06-30 – 2023-07-03 (×6): 12.5 mg via ORAL
  Filled 2023-06-30 (×6): qty 1

## 2023-06-30 MED ORDER — GLYCOPYRROLATE 0.2 MG/ML IJ SOLN
INTRAMUSCULAR | Status: AC
  Filled 2023-06-30: qty 1

## 2023-06-30 MED ORDER — LACTATED RINGERS IV SOLN: INTRAVENOUS | Status: DC

## 2023-06-30 MED ORDER — BUPIVACAINE LIPOSOME 1.3 % IJ SUSP
20.0000 mL | Freq: Once | INTRAMUSCULAR | Status: DC
Start: 2023-06-30 — End: 2023-06-30

## 2023-06-30 MED ORDER — TRAMADOL HCL 50 MG PO TABS
50.0000 mg | ORAL_TABLET | Freq: Four times a day (QID) | ORAL | Status: DC | PRN
  Administered 2023-06-30 – 2023-07-01 (×2): 50 mg via ORAL
  Administered 2023-07-03: 100 mg via ORAL
  Filled 2023-06-30: qty 1
  Filled 2023-06-30: qty 2
  Filled 2023-06-30 (×2): qty 1

## 2023-06-30 MED ORDER — SIMETHICONE 80 MG PO CHEW
40.0000 mg | CHEWABLE_TABLET | Freq: Four times a day (QID) | ORAL | Status: DC | PRN
  Administered 2023-06-30: 40 mg via ORAL
  Filled 2023-06-30 (×2): qty 1

## 2023-06-30 MED ORDER — INSULIN ASPART 100 UNIT/ML IJ SOLN
0.0000 [IU] | Freq: Three times a day (TID) | INTRAMUSCULAR | Status: DC
  Administered 2023-06-30 – 2023-07-02 (×5): 3 [IU] via SUBCUTANEOUS
  Administered 2023-07-02: 5 [IU] via SUBCUTANEOUS
  Administered 2023-07-02: 8 [IU] via SUBCUTANEOUS
  Administered 2023-07-03: 3 [IU] via SUBCUTANEOUS

## 2023-06-30 MED ORDER — ONDANSETRON HCL 4 MG/2ML IJ SOLN
4.0000 mg | Freq: Four times a day (QID) | INTRAMUSCULAR | Status: DC | PRN
  Administered 2023-06-30: 4 mg via INTRAVENOUS
  Filled 2023-06-30: qty 2

## 2023-06-30 MED ORDER — HYDRALAZINE HCL 20 MG/ML IJ SOLN: 10.0000 mg | INTRAMUSCULAR | Status: DC | PRN

## 2023-06-30 MED ORDER — SODIUM CHLORIDE 0.9 % IV SOLN
2.0000 g | Freq: Two times a day (BID) | INTRAVENOUS | Status: AC
  Administered 2023-06-30: 2 g via INTRAVENOUS
  Filled 2023-06-30: qty 2

## 2023-06-30 MED ORDER — SODIUM CHLORIDE 0.9% FLUSH
3.0000 mL | Freq: Two times a day (BID) | INTRAVENOUS | Status: DC
  Administered 2023-06-30 – 2023-07-03 (×6): 3 mL via INTRAVENOUS

## 2023-06-30 MED ORDER — METOPROLOL TARTRATE 5 MG/5ML IV SOLN: 5.0000 mg | Freq: Four times a day (QID) | INTRAVENOUS | Status: DC | PRN

## 2023-06-30 MED ORDER — ALVIMOPAN 12 MG PO CAPS
12.0000 mg | ORAL_CAPSULE | ORAL | Status: AC
  Administered 2023-06-30: 12 mg via ORAL
  Filled 2023-06-30: qty 1

## 2023-06-30 MED ORDER — PHENYLEPHRINE 80 MCG/ML (10ML) SYRINGE FOR IV PUSH (FOR BLOOD PRESSURE SUPPORT)
PREFILLED_SYRINGE | INTRAVENOUS | Status: AC
  Filled 2023-06-30: qty 10

## 2023-06-30 MED ORDER — ENOXAPARIN SODIUM 40 MG/0.4ML IJ SOSY
40.0000 mg | PREFILLED_SYRINGE | INTRAMUSCULAR | Status: DC
  Administered 2023-07-01 – 2023-07-02 (×2): 40 mg via SUBCUTANEOUS
  Filled 2023-06-30 (×2): qty 0.4

## 2023-06-30 MED ORDER — PROCHLORPERAZINE EDISYLATE 10 MG/2ML IJ SOLN: 5.0000 mg | Freq: Four times a day (QID) | INTRAMUSCULAR | Status: DC | PRN

## 2023-06-30 MED ORDER — PROPOFOL 500 MG/50ML IV EMUL
INTRAVENOUS | Status: AC
  Filled 2023-06-30: qty 50

## 2023-06-30 MED ORDER — CYCLOBENZAPRINE HCL 10 MG PO TABS
10.0000 mg | ORAL_TABLET | Freq: Three times a day (TID) | ORAL | Status: DC | PRN
  Administered 2023-07-01: 10 mg via ORAL
  Filled 2023-06-30: qty 1

## 2023-06-30 MED ORDER — MIDAZOLAM HCL 2 MG/2ML IJ SOLN
INTRAMUSCULAR | Status: DC | PRN
  Administered 2023-06-30: 1 mg via INTRAVENOUS
  Administered 2023-06-30: .5 mg via INTRAVENOUS
  Administered 2023-06-30: 1 mg via INTRAVENOUS

## 2023-06-30 MED ORDER — ENOXAPARIN SODIUM 40 MG/0.4ML IJ SOSY
40.0000 mg | PREFILLED_SYRINGE | Freq: Once | INTRAMUSCULAR | Status: AC
  Administered 2023-06-30: 40 mg via SUBCUTANEOUS
  Filled 2023-06-30: qty 0.4

## 2023-06-30 MED ORDER — INSULIN ASPART 100 UNIT/ML IJ SOLN: 0.0000 [IU] | INTRAMUSCULAR | Status: DC | PRN

## 2023-06-30 MED ORDER — ROCURONIUM BROMIDE 10 MG/ML (PF) SYRINGE
PREFILLED_SYRINGE | INTRAVENOUS | Status: DC | PRN
Start: 2023-06-30 — End: 2023-06-30
  Administered 2023-06-30: 20 mg via INTRAVENOUS
  Administered 2023-06-30: 70 mg via INTRAVENOUS

## 2023-06-30 MED ORDER — INSULIN GLARGINE-YFGN 100 UNIT/ML ~~LOC~~ SOLN: 20.0000 [IU] | Freq: Two times a day (BID) | SUBCUTANEOUS | Status: DC

## 2023-06-30 MED ORDER — FENTANYL CITRATE PF 50 MCG/ML IJ SOSY
PREFILLED_SYRINGE | INTRAMUSCULAR | Status: AC
  Administered 2023-06-30: 50 ug via INTRAVENOUS
  Filled 2023-06-30: qty 2

## 2023-06-30 MED ORDER — ORAL CARE MOUTH RINSE: 15.0000 mL | Freq: Once | OROMUCOSAL | Status: AC

## 2023-06-30 MED ORDER — VASOPRESSIN 20 UNIT/ML IV SOLN
INTRAVENOUS | Status: AC
  Filled 2023-06-30: qty 1

## 2023-06-30 MED ORDER — CHLORHEXIDINE GLUCONATE 0.12 % MT SOLN
15.0000 mL | Freq: Once | OROMUCOSAL | Status: AC
  Administered 2023-06-30: 15 mL via OROMUCOSAL

## 2023-06-30 MED ORDER — DIPHENHYDRAMINE HCL 12.5 MG/5ML PO ELIX: 12.5000 mg | ORAL_SOLUTION | Freq: Four times a day (QID) | ORAL | Status: DC | PRN

## 2023-06-30 MED ORDER — GABAPENTIN 300 MG PO CAPS
300.0000 mg | ORAL_CAPSULE | ORAL | Status: AC
  Administered 2023-06-30: 300 mg via ORAL
  Filled 2023-06-30: qty 1

## 2023-06-30 MED ORDER — ONDANSETRON HCL 4 MG/2ML IJ SOLN: 4.0000 mg | Freq: Once | INTRAMUSCULAR | Status: DC | PRN

## 2023-06-30 MED ORDER — SODIUM CHLORIDE 0.9 % IR SOLN
Status: DC | PRN
  Administered 2023-06-30: 1000 mL

## 2023-06-30 MED ORDER — HYDROMORPHONE HCL 1 MG/ML IJ SOLN
INTRAMUSCULAR | Status: DC | PRN
  Administered 2023-06-30: .3 mg via INTRAVENOUS

## 2023-06-30 MED ORDER — KETAMINE HCL 10 MG/ML IJ SOLN
INTRAMUSCULAR | Status: DC | PRN
  Administered 2023-06-30: 20 mg via INTRAVENOUS

## 2023-06-30 MED ORDER — NAPHAZOLINE-GLYCERIN 0.012-0.25 % OP SOLN: 1.0000 [drp] | Freq: Four times a day (QID) | OPHTHALMIC | Status: DC | PRN

## 2023-06-30 MED ORDER — MELATONIN 3 MG PO TABS: 3.0000 mg | ORAL_TABLET | Freq: Every evening | ORAL | Status: DC | PRN

## 2023-06-30 SURGICAL SUPPLY — 103 items
ADAPTER GOLDBERG URETERAL (ADAPTER) IMPLANT
APPLIER CLIP 5 13 M/L LIGAMAX5 (MISCELLANEOUS) IMPLANT
APPLIER CLIP ROT 10 11.4 M/L (STAPLE) IMPLANT
BAG COUNTER SPONGE SURGICOUNT (BAG) ×3 IMPLANT
BAG URO CATCHER STRL LF (MISCELLANEOUS) ×3 IMPLANT
BLADE EXTENDED COATED 6.5IN (ELECTRODE) IMPLANT
CANNULA REDUCER 12-8 DVNC XI (CANNULA) IMPLANT
CATH URETL OPEN 5X70 (CATHETERS) IMPLANT
CELLS DAT CNTRL 66122 CELL SVR (MISCELLANEOUS) IMPLANT
CHLORAPREP W/TINT 26 (MISCELLANEOUS) IMPLANT
CLIP APPLIE 5 13 M/L LIGAMAX5 (MISCELLANEOUS) IMPLANT
CLIP APPLIE ROT 10 11.4 M/L (STAPLE) IMPLANT
CLOTH BEACON ORANGE TIMEOUT ST (SAFETY) ×3 IMPLANT
COVER SURGICAL LIGHT HANDLE (MISCELLANEOUS) ×6 IMPLANT
COVER TIP SHEARS 8 DVNC (MISCELLANEOUS) ×3 IMPLANT
DEFOGGER SCOPE WARMER CLEARIFY (MISCELLANEOUS) IMPLANT
DEVICE TROCAR PUNCTURE CLOSURE (ENDOMECHANICALS) IMPLANT
DRAIN CHANNEL 19F RND (DRAIN) IMPLANT
DRAPE ARM DVNC X/XI (DISPOSABLE) ×12 IMPLANT
DRAPE COLUMN DVNC XI (DISPOSABLE) ×3 IMPLANT
DRAPE SURG IRRIG POUCH 19X23 (DRAPES) ×3 IMPLANT
DRIVER NDL LRG 8 DVNC XI (INSTRUMENTS) ×3 IMPLANT
DRIVER NDLE LRG 8 DVNC XI (INSTRUMENTS) IMPLANT
DRSG OPSITE POSTOP 4X10 (GAUZE/BANDAGES/DRESSINGS) IMPLANT
DRSG OPSITE POSTOP 4X6 (GAUZE/BANDAGES/DRESSINGS) IMPLANT
DRSG OPSITE POSTOP 4X8 (GAUZE/BANDAGES/DRESSINGS) IMPLANT
DRSG TEGADERM 2-3/8X2-3/4 SM (GAUZE/BANDAGES/DRESSINGS) ×15 IMPLANT
DRSG TEGADERM 4X4.75 (GAUZE/BANDAGES/DRESSINGS) IMPLANT
ELECT PENCIL ROCKER SW 15FT (MISCELLANEOUS) ×3 IMPLANT
ELECT REM PT RETURN 15FT ADLT (MISCELLANEOUS) ×3 IMPLANT
ENDOLOOP SUT PDS II 0 18 (SUTURE) IMPLANT
EVACUATOR SILICONE 100CC (DRAIN) IMPLANT
GAUZE PACKING IODOFORM 1/4X15 (PACKING) IMPLANT
GAUZE SPONGE 2X2 8PLY STRL LF (GAUZE/BANDAGES/DRESSINGS) ×3 IMPLANT
GLOVE ECLIPSE 8.0 STRL XLNG CF (GLOVE) ×9 IMPLANT
GLOVE INDICATOR 8.0 STRL GRN (GLOVE) ×9 IMPLANT
GLOVE SURG LX STRL 7.5 STRW (GLOVE) ×3 IMPLANT
GOWN SRG XL LVL 4 BRTHBL STRL (GOWNS) ×3 IMPLANT
GOWN STRL REUS W/ TWL XL LVL3 (GOWN DISPOSABLE) ×15 IMPLANT
GRASPER SUT TROCAR 14GX15 (MISCELLANEOUS) IMPLANT
GRASPER TIP-UP FEN DVNC XI (INSTRUMENTS) ×3 IMPLANT
GUIDEWIRE ANG ZIPWIRE 038X150 (WIRE) IMPLANT
GUIDEWIRE STR DUAL SENSOR (WIRE) IMPLANT
HOLDER FOLEY CATH W/STRAP (MISCELLANEOUS) ×3 IMPLANT
IRRIG SUCT STRYKERFLOW 2 WTIP (MISCELLANEOUS) ×3 IMPLANT
IRRIGATION SUCT STRKRFLW 2 WTP (MISCELLANEOUS) ×3 IMPLANT
KIT PROCEDURE DVNC SI (MISCELLANEOUS) ×3 IMPLANT
KIT SIGMOIDOSCOPE (SET/KITS/TRAYS/PACK) IMPLANT
KIT TURNOVER KIT A (KITS) IMPLANT
MANIFOLD NEPTUNE II (INSTRUMENTS) ×3 IMPLANT
NDL INSUFFLATION 14GA 120MM (NEEDLE) ×3 IMPLANT
NEEDLE INSUFFLATION 14GA 120MM (NEEDLE) ×3 IMPLANT
PACK CARDIOVASCULAR III (CUSTOM PROCEDURE TRAY) ×3 IMPLANT
PACK COLON (CUSTOM PROCEDURE TRAY) ×3 IMPLANT
PACK CYSTO (CUSTOM PROCEDURE TRAY) ×3 IMPLANT
PAD POSITIONING PINK XL (MISCELLANEOUS) ×3 IMPLANT
PROTECTOR NERVE ULNAR (MISCELLANEOUS) ×6 IMPLANT
RELOAD STAPLE 45 3.5 BLU DVNC (STAPLE) IMPLANT
RELOAD STAPLE 45 4.3 GRN DVNC (STAPLE) IMPLANT
RELOAD STAPLE 60 3.5 BLU DVNC (STAPLE) IMPLANT
RELOAD STAPLE 60 4.3 GRN DVNC (STAPLE) IMPLANT
RETRACTOR WND ALEXIS 18 MED (MISCELLANEOUS) IMPLANT
RTRCTR WOUND ALEXIS 18CM MED (MISCELLANEOUS) IMPLANT
SCISSORS LAP 5X35 DISP (ENDOMECHANICALS) ×3 IMPLANT
SCISSORS MNPLR CVD DVNC XI (INSTRUMENTS) ×3 IMPLANT
SEAL UNIV 5-12 XI (MISCELLANEOUS) ×12 IMPLANT
SEALER VESSEL EXT DVNC XI (MISCELLANEOUS) ×3 IMPLANT
SOL ELECTROSURG ANTI STICK (MISCELLANEOUS) ×3 IMPLANT
SOLUTION ELECTROSURG ANTI STCK (MISCELLANEOUS) ×3 IMPLANT
SPIKE FLUID TRANSFER (MISCELLANEOUS) ×3 IMPLANT
STAPLER 45 SUREFORM DVNC (STAPLE) IMPLANT
STAPLER 60 SUREFORM DVNC (STAPLE) IMPLANT
STAPLER ECHELON POWER CIR 29 (STAPLE) IMPLANT
STAPLER ECHELON POWER CIR 31 (STAPLE) IMPLANT
STAPLER RELOAD 3.5X45 BLU DVNC (STAPLE) IMPLANT
STAPLER RELOAD 3.5X60 BLU DVNC (STAPLE) IMPLANT
STAPLER RELOAD 4.3X45 GRN DVNC (STAPLE) IMPLANT
STAPLER RELOAD 4.3X60 GRN DVNC (STAPLE) ×3 IMPLANT
STOPCOCK 4 WAY LG BORE MALE ST (IV SETS) ×6 IMPLANT
SURGILUBE 2OZ TUBE FLIPTOP (MISCELLANEOUS) IMPLANT
SUT MNCRL AB 4-0 PS2 18 (SUTURE) ×3 IMPLANT
SUT PDS AB 1 CT1 27 (SUTURE) ×6 IMPLANT
SUT PROLENE 0 CT 2 (SUTURE) IMPLANT
SUT PROLENE 2 0 KS (SUTURE) IMPLANT
SUT PROLENE 2 0 SH DA (SUTURE) IMPLANT
SUT SILK 2 0 SH CR/8 (SUTURE) IMPLANT
SUT SILK 2-0 18XBRD TIE 12 (SUTURE) IMPLANT
SUT SILK 3 0 SH CR/8 (SUTURE) ×3 IMPLANT
SUT V-LOC BARB 180 2/0GR6 GS22 (SUTURE) IMPLANT
SUT VIC AB 3-0 SH 18 (SUTURE) IMPLANT
SUT VIC AB 3-0 SH 27XBRD (SUTURE) IMPLANT
SUT VICRYL 0 UR6 27IN ABS (SUTURE) IMPLANT
SUTURE V-LC BRB 180 2/0GR6GS22 (SUTURE) IMPLANT
SYR 20ML ECCENTRIC (SYRINGE) ×3 IMPLANT
SYS LAPSCP GELPORT 120MM (MISCELLANEOUS) IMPLANT
SYS WOUND ALEXIS 18CM MED (MISCELLANEOUS) ×3 IMPLANT
SYSTEM LAPSCP GELPORT 120MM (MISCELLANEOUS) IMPLANT
SYSTEM WOUND ALEXIS 18CM MED (MISCELLANEOUS) ×3 IMPLANT
TRAY FOLEY MTR SLVR 16FR STAT (SET/KITS/TRAYS/PACK) ×3 IMPLANT
TROCAR ADV FIXATION 5X100MM (TROCAR) ×3 IMPLANT
TUBING CONNECTING 10 (TUBING) ×9 IMPLANT
TUBING INSUFFLATION 10FT LAP (TUBING) ×3 IMPLANT
TUBING UROLOGY SET (TUBING) IMPLANT

## 2023-06-30 NOTE — Anesthesia Procedure Notes (Signed)
 Arterial Line Insertion Start/End3/14/2025 7:50 AM, 06/30/2023 7:56 AM Performed by: Collene Schlichter, MD, anesthesiologist  Patient location: OR. Preanesthetic checklist: patient identified, IV checked, site marked, risks and benefits discussed, surgical consent, monitors and equipment checked, pre-op evaluation, timeout performed and anesthesia consent Lidocaine 1% used for infiltration Left, radial was placed Catheter size: 20 G Hand hygiene performed  and maximum sterile barriers used   Attempts: 1 Procedure performed without using ultrasound guided technique. Following insertion, dressing applied and Biopatch. Post procedure assessment: normal and unchanged  Patient tolerated the procedure well with no immediate complications.

## 2023-06-30 NOTE — Anesthesia Procedure Notes (Signed)
 Procedure Name: Intubation Date/Time: 06/30/2023 8:53 AM  Performed by: Floydene Flock, CRNAPre-anesthesia Checklist: Patient identified, Emergency Drugs available, Suction available and Patient being monitored Patient Re-evaluated:Patient Re-evaluated prior to induction Oxygen Delivery Method: Circle system utilized Preoxygenation: Pre-oxygenation with 100% oxygen Induction Type: IV induction Ventilation: Mask ventilation without difficulty Laryngoscope Size: Mac and 3 Grade View: Grade I Tube type: Oral Tube size: 7.5 mm Number of attempts: 1 Airway Equipment and Method: Stylet Placement Confirmation: ETT inserted through vocal cords under direct vision, positive ETCO2 and breath sounds checked- equal and bilateral Secured at: 22 cm Tube secured with: Tape Dental Injury: Teeth and Oropharynx as per pre-operative assessment

## 2023-06-30 NOTE — Progress Notes (Signed)
 Dr. Michaell Cowing paged at this time for surgical orders

## 2023-06-30 NOTE — Interval H&P Note (Signed)
 History and Physical Interval Note:  06/30/2023 7:04 AM  [name]  has presented today for surgery, with the diagnosis of COLOSTOMY FOR COLON RESECTION DESIRE FOR OSTOMY TAKEDOWN.  The various methods of treatment have been discussed with the patient and family. After consideration of risks, benefits and other options for treatment, the patient has consented to  Procedure(s): XI ROBOTIC ASSISTED OSTOMY TAKEDOWN (N/A) LAPAROTOMY, FOR LYSIS OF ADHESIONS (N/A) RIGID PROCTOSCOPY (N/A) CYSTOSCOPY with FIREFLY INJECTION (N/A) as a surgical intervention.  The patient's history has been reviewed, patient examined, no change in status, stable for surgery.  I have reviewed the patient's chart and labs.  Questions were answered to the patient's satisfaction.    I have re-reviewed the the patient's records, history, medications, and allergies.  I have re-examined the patient.  I again discussed intraoperative plans and goals of post-operative recovery.  The patient agrees to proceed.  [name]  Apr 29, 1945 [id number]  Patient Care Team: Crist Fat, MD as PCP - General (Internal Medicine) Georgeanna Lea, MD as PCP - Cardiology (Cardiology) Misenheimer, Marcial Pacas, MD as Consulting Physician (Gastroenterology) Diamantina Monks, MD as Consulting Physician (Surgery) Georgeanna Lea, MD as Consulting Physician (Cardiology) Karie Soda, MD as Consulting Physician (Colon and Rectal Surgery) Jerilee Field, MD as Consulting Physician (Urology)  Patient Active Problem List   Diagnosis Date Noted   Chronic left shoulder pain 05/24/2023   Trigger ring finger of right hand 03/29/2023   Inadequately controlled diabetes mellitus (HCC) 03/29/2023   Fatigue 03/13/2023   Benign prostatic hyperplasia with urinary frequency 02/23/2023   PVD (peripheral vascular disease) (HCC) 02/23/2023   History of bladder cancer 02/23/2023   BMI 27.0-27.9,adult 02/23/2023   Type II diabetes mellitus (HCC)     Hypercholesterolemia 10/28/2022   Rotator cuff arthropathy of right shoulder 09/27/2022   Poorly controlled T2 diabetes mellitus (HCC) 09/27/2022   Chronic right shoulder pain 09/02/2022   Type 2 diabetes mellitus with other circulatory complications (HCC) 07/22/2022   Paroxysmal atrial fibrillation (HCC) 07/22/2022   Essential hypertension 07/22/2022   Acute pain of both shoulders 07/22/2022   Chronic combined systolic (congestive) and diastolic (congestive) heart failure (HCC) 07/22/2022   Diverticulitis 06/10/2022   Protein-calorie malnutrition, severe 05/29/2022   Diverticulitis of large intestine with abscess 05/24/2022   AKI (acute kidney injury) (HCC) 05/24/2022   Hypothyroidism 05/24/2022   Need for prophylactic vaccination and inoculation against influenza 03/28/2022   Lethargy 03/28/2022   Bladder cancer (HCC) 02/09/2022   Coronary artery disease 02/09/2022   Ischemic cardiomyopathy 02/09/2022   Lumbar spinal stenosis 02/09/2022   Myocardial infarction (HCC) 02/09/2022   NSVT (nonsustained ventricular tachycardia) (HCC) 02/09/2022   PAF (paroxysmal atrial fibrillation) (HCC) 02/09/2022   PVC's (premature ventricular contractions) 02/09/2022   DM2 (diabetes mellitus, type 2) (HCC) 02/09/2022   Diverticulitis large intestine 05/11/2021   Diverticulitis of sigmoid colon 05/11/2021   Status post coronary artery stent placement    Coronary artery disease involving native coronary artery of native heart with unstable angina pectoris (HCC)    Acute low back pain 01/12/2015   Atherosclerosis of native artery of extremity with intermittent claudication (HCC) 08/13/2013   Backache 02/08/2013   TOBACCO ABUSE 05/29/2009   HYPERLIPIDEMIA-MIXED 09/22/2008   PVD 09/22/2008    Past Medical History:  Diagnosis Date   Acute low back pain 01/12/2015   Acute pain of both shoulders 07/22/2022   AKI (acute kidney injury) (HCC) 05/24/2022   Arthritis    Atherosclerosis of  native  arteries of the extremities with intermittent claudication 08/13/2013   Backache 02/08/2013   Bladder cancer [facility])    resection x3   Chronic combined systolic (congestive) and diastolic (congestive) heart failure (HCC) 07/22/2022   Chronic right shoulder pain 09/02/2022   Chronic systolic CHF (congestive heart failure) (HCC)    Cigarette smoker    Coronary artery disease    status post DMI RX Taxus stent RCA 2006 with susequent Stent LAD and subsequent  stent thrombosis RCA unable to be opened 2006 -neg mv 10/2008, 10/16/17 ISR to pLAD with PTCA/DES, CTO of RCA with collaterals, EF 25%   Coronary artery disease involving native coronary artery of native heart with unstable angina pectoris (HCC)    Current moderate episode of major depressive disorder without prior episode (HCC) 03/28/2022   Diverticulitis 06/10/2022   Diverticulitis large intestine 05/11/2021   Diverticulitis of large intestine with abscess 05/24/2022   Diverticulitis of sigmoid colon 05/11/2021   DM2 (diabetes mellitus, type 2) (HCC) 02/09/2022   status post bilateral aortobifemoral bypass [id number] with recent fem to fembypass April  2011 per DR. Early     Essential hypertension 07/22/2022   Hypercholesterolemia 10/28/2022   HYPERLIPIDEMIA-MIXED 09/22/2008   Qualifier: Diagnosis of  By: Vikki Ports     Hypothyroidism 05/24/2022   Ischemic cardiomyopathy    ejection fraction of 40-45%   Lethargy 03/28/2022   Lumbar spinal stenosis    Myocardial infarction (HCC)    "I've had 4" (10/16/2017)   Need for prophylactic vaccination and inoculation against influenza 03/28/2022   NSVT (nonsustained ventricular tachycardia) (HCC)    PAF (paroxysmal atrial fibrillation) (HCC) 02/09/2022   Paroxysmal atrial fibrillation (HCC)    Poorly controlled T2 diabetes mellitus (HCC) 09/27/2022   Protein-calorie malnutrition, severe 05/29/2022   PVC's (premature ventricular contractions)    PVD 09/22/2008   Qualifier: Diagnosis of  By:  Vikki Ports     Rotator cuff arthropathy of right shoulder 09/27/2022   Status post coronary artery stent placement    TOBACCO ABUSE 05/29/2009   Qualifier: Diagnosis of  By: Meryl Crutch, RN, BSN, Doreatha Lew    Type 2 diabetes mellitus with other circulatory complications (HCC) 07/22/2022   Type II diabetes mellitus (HCC)    Unstable angina (HCC) 10/16/2017    Past Surgical History:  Procedure Laterality Date   AORTA - BILATERAL FEMORAL ARTERY BYPASS GRAFT Bilateral 1992   APPENDECTOMY     BACK SURGERY     COLECTOMY WITH COLOSTOMY CREATION/HARTMANN PROCEDURE N/A 05/26/2022   Procedure: COLECTOMY WITH COLOSTOMY CREATION/HARTMANN PROCEDURE;  Surgeon: Diamantina Monks, MD;  Location: [facility] OR;  Service: General;  Laterality: N/A;   CORONARY ANGIOPLASTY WITH STENT PLACEMENT     taxus stent  placed into his right coronary artery; stent placed to the LAD.     CORONARY STENT INTERVENTION N/A 10/16/2017   Procedure: CORONARY STENT INTERVENTION;  Surgeon: Kathleene Hazel, MD;  Location: [facility] INVASIVE [facility];  Service: Cardiovascular;  Laterality: N/A;   FEMORAL-FEMORAL BYPASS GRAFT  07/2009   LAPAROTOMY N/A 05/26/2022   Procedure: EXPLORATORY LAPAROTOMY;  Surgeon: Diamantina Monks, MD;  Location: [facility] OR;  Service: General;  Laterality: N/A;   LUMBAR MICRODISCECTOMY  12/31/08   RIGHT/LEFT HEART CATH AND CORONARY ANGIOGRAPHY N/A 10/16/2017   Procedure: RIGHT/LEFT HEART CATH AND CORONARY ANGIOGRAPHY;  Surgeon: Kathleene Hazel, MD;  Location: [facility];  Service: Cardiovascular;  Laterality: N/A;   SHOULDER OPEN ROTATOR CUFF REPAIR Left  Social History   Socioeconomic History   Marital status: Divorced    Spouse name: Not on file   Number of children: 1   Years of education: Not on file   Highest education level: Not on file  Occupational History   Occupation: retired    Associate Professor: RETIRED    Comment: truck driver  Tobacco Use   Smoking status: Former    Current packs/day:  0.00    Average packs/day: 0.5 packs/day for 56.0 years (28.0 ttl pk-yrs)    Types: Cigars, Cigarettes    Start date: 1966    Quit date: 2022    Years since quitting: 3.2   Smokeless tobacco: Former    Types: Associate Professor status: Never Used  Substance and Sexual Activity   Alcohol use: Yes    Comment: Rarely 1 beer   Drug use: Never   Sexual activity: Not Currently  Other Topics Concern   Not on file  Social History Narrative   Lives with girlfriend in [place].     Social Drivers of Corporate investment banker Strain: Not on file  Food Insecurity: No Food Insecurity (05/24/2022)   Hunger Vital Sign    Worried About Running Out of Food in the Last Year: Never true    Ran Out of Food in the Last Year: Never true  Transportation Needs: No Transportation Needs (05/24/2022)   PRAPARE - Administrator, Civil Service (Medical): No    Lack of Transportation (Non-Medical): No  Physical Activity: Not on file  Stress: Not on file  Social Connections: Not on file  Intimate Partner Violence: Not At Risk (05/27/2022)   Humiliation, Afraid, Rape, and Kick questionnaire    Fear of Current or Ex-Partner: No    Emotionally Abused: No    Physically Abused: No    Sexually Abused: No    Family History  Problem Relation Age of Onset   Coronary artery disease Unknown    Diabetes Unknown    Cardiomyopathy Mother    Heart disease Mother    Hyperlipidemia Mother    Coronary artery disease Father    Stroke Father    Diabetes Father    Heart disease Father        before age 103   Hyperlipidemia Father    Heart attack Father    Diabetes Sister    Heart disease Sister        before age 71   Hyperlipidemia Sister    Heart attack Sister     Medications Prior to Admission  Medication Sig Dispense Refill Last Dose/Taking   amLODipine (NORVASC) 5 MG tablet Take 1 tablet (5 mg total) by mouth daily. 90 tablet 3 06/29/2023   aspirin EC 81 MG tablet Take 1 tablet (81 mg  total) by mouth daily. Swallow whole. 90 tablet 3 06/29/2023   carvedilol (COREG) 12.5 MG tablet Take 1 tablet (12.5 mg total) by mouth 2 (two) times daily with a meal. 180 tablet 3 06/29/2023   cetirizine (ZYRTEC) 10 MG tablet Take 1 tablet (10 mg total) by mouth daily. 90 tablet 0 06/29/2023   cyclobenzaprine (FLEXERIL) 10 MG tablet Take 1 tablet (10 mg total) by mouth 3 (three) times daily as needed for muscle spasms. 15 tablet 0 Past Week   ezetimibe (ZETIA) 10 MG tablet Take 1 tablet (10 mg total) by mouth daily. 90 tablet 3 06/29/2023   glipiZIDE (GLUCOTROL) 10 MG tablet TAKE 1 TABLET(10 MG) BY  MOUTH TWICE DAILY 60 tablet 6 06/29/2023   insulin glargine (LANTUS) 100 unit/mL SOPN Start with 8 Units subcut daily.  Increase by 3 Units every 3 days until your FSBS are consistently less than 150 (Patient taking differently: Inject 15-17 Units into the skin 2 (two) times daily.) 15 mL 2 06/29/2023   JARDIANCE 10 MG TABS tablet Take 1 tablet (10 mg total) by mouth daily. 30 tablet 2 06/27/2023   nitroGLYCERIN (NITROSTAT) 0.4 MG SL tablet Place 0.4 mg under the tongue every 5 (five) minutes as needed for chest pain.   Taking As Needed   rivaroxaban (XARELTO) 20 MG TABS tablet Take 1 tablet (20 mg total) by mouth daily with supper. 30 tablet 3 06/27/2023 at  9:00 PM   rosuvastatin (CRESTOR) 40 MG tablet Take 1 tablet (40 mg total) by mouth daily. 90 tablet 1 06/29/2023   sacubitril-valsartan (ENTRESTO) 49-51 MG Take 1 tablet by mouth 2 (two) times daily. 60 tablet 3 06/29/2023   tamsulosin (FLOMAX) 0.4 MG CAPS capsule Take 0.8 mg by mouth every evening.   06/29/2023   tadalafil (CIALIS) 20 MG tablet Take 10 mg by mouth.       Current Facility-Administered Medications  Medication Dose Route Frequency Provider Last Rate Last Admin   carvedilol (COREG) 12.5 MG tablet            insulin aspart (novoLOG) injection 0-7 Units  0-7 Units Subcutaneous Q2H PRN Collene Schlichter, MD       lactated ringers infusion    Intravenous Continuous Kaylyn Layer, MD 10 mL/hr at 06/30/23 1610 New Bag at 06/30/23 9604     Allergies  Allergen Reactions   Lisinopril Swelling and Rash    Rash - face and tounge swell    BP (!) (P) 150/78   Pulse (P) 88   Temp (P) 97.7 F (36.5 C) (Oral)   Resp (P) 17   Wt 81.8 kg   SpO2 (P) 97%   BMI 28.25 kg/m   Labs: Results for orders placed or performed during the hospital encounter of 06/30/23 (from the past 48 hours)  Glucose, capillary     Status: Abnormal   Collection Time: 06/30/23  5:52 AM  Result Value Ref Range   Glucose-Capillary 164 (H) 70 - 99 mg/dL    Comment: Glucose reference range applies only to samples taken after fasting for at least 8 hours.   Comment 1 Notify RN     Imaging / Studies: No results found.   [name], M.D., F.A.C.S. Gastrointestinal and Minimally Invasive Surgery [facility] [address] [place], [place] [address] [phone] Main / Paging  06/30/2023 7:04 AM    [name]

## 2023-06-30 NOTE — Op Note (Signed)
 06/30/2023  11:11 AM  PATIENT:  [name]  78 y.o. male  Patient Care Team: Leonia Reader, Barbara Cower, MD as PCP - General (Internal Medicine) Georgeanna Lea, MD as PCP - Cardiology (Cardiology) Misenheimer, Marcial Pacas, MD as Consulting Physician (Gastroenterology) Diamantina Monks, MD as Consulting Physician (Surgery) Georgeanna Lea, MD as Consulting Physician (Cardiology) Karie Soda, MD as Consulting Physician (Colon and Rectal Surgery) Jerilee Field, MD as Consulting Physician (Urology)  PRE-OPERATIVE DIAGNOSIS:  COLOSTOMY FOR COLON RESECTION DESIRE FOR OSTOMY TAKEDOWN  POST-OPERATIVE DIAGNOSIS:   COLOSTOMY FOR COLON RESECTION DESIRE FOR OSTOMY TAKEDOWN PARASTOMAL HERNIA AT COLOSTOMY  PROCEDURE:   ROBOTIC RECTOSIGMOID RESECTION  TAKEDOWN OF END COLOSTOMY WITH ANASTOMOSIS CLOSURE OF PARASTOMAL HERNIA TRANSVERSUS ABDOMINIS PLANE (TAP) BLOCK - BILATERAL LYSIS OF ADHESIONS x90 MINUTES (66% OF CASE) FLEXIBLE SIGMOIDOSCOPY  SURGEON:  [name], MD  ASSISTANT:  Angelena Form, MD  An experienced assistant was required given the standard of surgical care given the complexity of the case.  This assistant was needed for exposure, dissection, suction, tissue approximation, retraction, perception, etc  ANESTHESIA:  General endotracheal intubation anesthesia (GETA) and Regional TRANSVERSUS ABDOMINIS PLANE (TAP) nerve block -BILATERAL for perioperative & postoperative pain control at the level of the transverse abdominis & preperitoneal spaces along the flank at the anterior axillary line, from subcostal ridge to iliac crest under laparoscopic guidance provided with liposomal bupivacaine (Experel) 20mL mixed with 60 mL of bupivicaine 0.25% with epinephrine  Estimated Blood Loss (EBL):   Total I/O In: 1750 [I.V.:1400; IV Piggyback:350] Out: 195 [Urine:185; Blood:10].   (See anesthesia record)  Delay start of Pharmacological VTE agent (>24hrs) due to concerns of significant  anemia, surgical blood loss, or risk of bleeding?:  no  DRAINS: (None)  SPECIMEN:  Colostomy (sutured en distal at skin), Rectosigmoid (sutured end proximal), and Distal anastomotic ring (FINAL DISTAL MARGIN)  DISPOSITION OF SPECIMEN:  Pathology  COUNTS:  Sponge, needle, & instrument counts CORRECT  PLAN OF CARE: Admit to inpatient   PATIENT DISPOSITION:  PACU - hemodynamically stable.  INDICATION: Pleasant patient status post colectomy with end ostomy.  The patient has recovered from that surgery and has understandably requested ostomy takedown.  Medically stabilized and felt reasonable to proceed.   I discussed the procedure with the patient:  The anatomy & physiology of the digestive tract was discussed.  The pathophysiology was discussed.  Possibility of remaining with an ostomy permanently was discussed.  I offered ostomy takedown.  Minimally invasive & open techniques were discussed.   Risks such as bleeding, infection, abscess, leak, reoperation, possible re-ostomy, injury to other organs, hernia, heart attack, death, and other risks were discussed.   I noted a good likelihood this will help address the problem.  Goals of post-operative recovery were discussed as well.  We will work to minimize complications.  Questions were answered.  The patient expresses understanding & wishes to proceed with surgery.  OR FINDINGS:   Moderately dense adhesions especially of omentum to anterior abdominal wall and numerous interloop small bowel adhesions.  Consistent with prior [facility] resection for diverticulitis with abscess and peritonitis. No obvious metastatic disease on visceral parietal peritoneum or liver.  It is a 31mm EEA anastomosis ( distal descending colon  connected to proximal rectum.)  It rests 13 cm from the anal verge by rigid proctoscopy.  CASE DATA: Type of patient?: Elective [facility] Private Case Status of Case? Elective Scheduled Infection Present At Time Of Surgery (PATOS)?   NO   DESCRIPTION:  Informed consent was confirmed.  The patient underwent general anaesthesia without difficulty.  The patient was positioned appropriately.  VTE prevention in place.  Patient underwent cystoscopy by Dr. Cordella Register with [facility].  Cystoscopy insufflation of firefly.  No major concerns.  See his separate operative note.  The patient's abdomen was clipped, prepped, & draped in a sterile fashion.  Surgical timeout confirmed our plan.  Peritoneal entry with a laparoscopic port was obtained using Varess spring needle entry technique in the left upper abdomen as the patient was positioned in reverse Trendelenburg.  I induced carbon dioxide insufflation.  No change in end tidal CO2 measurements.  Full symmetrical abdominal distention.  Initial port was carefully placed.  Camera inspection revealed no injury.  Extra ports were carefully placed under direct laparoscopic visualization.  Had to blunt and sharply freed a large volume of greater omentum out of the right side of the abdomen to be able to place the ports.  Xi robot carefully docked & instruments placed.  We then continued with minimally invasive exploration.  I worked to freed adhesions to the anterior abdominal wall parietal peritoneum.  Moderately large volume of especially greater omentum.  Freed omental adhesions off the colostomy and visceral peritoneum.  Make sure to free all small bowel interloop adhesions and adhesions to the left retroperitoneum and colostomy mesentery and pelvis.  There was some rather dense adhesions with some chronically trapped internal hernias.  Freed numerous interloop small bowel adhesions and did inspection to prove no injury.  Able to reflect that out.  We inspected the small intestine to ensure there is no injury or other surprises.  Did robotic lyse adhesions around the colostomy and went up into his moderate-sized parastomal hernia to help free it off the fascia and subcutaneous tissues.   Freed along the left lateral gutter and freed greater omental adhesions off the descending colon and splenic flexure.  There seem to be enough length that we do not need to do a formal splenic flexure mobilization.  Hemostasis was good.    We focused on dissection down in the pelvis.  Work to free adhesions and identify the rectal stump.  Freed off visceral peritoneum along the right and left anterior rectal pelvic reflection.  Work to come behind the rectal stump and the presacral plane to help elevate the mesorectum off the sacral promontory to be sure we adequately identified the rectosigmoid junction.  Because there was some redundant rectosigmoid tissues and end up transecting through the mesentery at the rectosigmoid junction and the proximal rectum.  Skeletonized the mesorectum more distally until he had a healthy area that was not strictured in the proximal rectum.  Came across the visceral peritoneum along the lateral rectum and the peritoneal reflection to ensure no stricturing of the pelvis.   Hemostasis was good.  Side proceed with colostomy takedown.  We made an incision around the ostomy.  I got into the subcutaneous tissues.  I used careful focused right angle dissection and sharp dissection.  Some focused cautery dissection as well.  That helped to free adhesions to the subcutaneous tisses & fascia.  I was able to enter into the peritoneum focally.  I did a gentle finger sweep.  Gradually came around circumferentially and freed the bowel from remaining adhesions to the abdominal wall.  We were able eviscerate the ostomy to do inspection and assured viability and hemostasis.  I chose as distal the location that was viable for the proximal end of the anastomosis.  I clamped the colon at the point of resection using a reusable pursestringer device.  Passed a 2-0 Keith needle. I transected at the descending/sigmoid junction with a scalpel. I got healthy bleeding mucosa.  We sent the rectosigmoid  colon specimen off to go to pathology.  We sized the colon orifice.   I chose a 31mm EEA anvil stapler system.  I reinforced the prolene pursestring with interrupted silk "belt loop" sutures.  I placed the anvil to the open end of the proximal remaining colon and closed around it using the pursestring.   Returned the viscera back into the abdomen and did inspection of the abdomen.    Robot was redocked.  We did copious irrigation with crystalloid solution.  Hemostasis was good.  The distal end of the colon at the handle easily reached down to the rectal stump, therefore, splenic flexure mobilization was not needed.  I robotically transected the rectosigmoid stump using a green 60 mm robotic firing of the stapler.  That rectosigmoid stump was removed.       Dr Cliffton Asters scrubbed down and did gentle anal dilation and advanced the EEA stapler up the rectal stump. The spike was brought out at the provimal end of the rectal stump under direct visualization.  I attached the anvil of the proximal colon the spike of the stapler.  Reduce the small bowel out of the left side of the abdomen to make sure the mesentery laid well and there were no trapped internal hernias.  Anvil was tightened down and held clamped for 60 seconds.  Orientation was confirmed such that there is no twisting of the colon nor small bowel underneath the mesenteric defect. No concerning tension.  The EEA stapler was fired and held clamped for 30 seconds. The stapler was released & removed. Blue stitch is in the proximal ring.  Care was taken to ensure no other structures were incorporated within this either.  We noted 2 excellent anastomotic rings.   The colon proximal to the anastomosis was then gently occluded. The pelvis was filled with sterile irrigation.  Dr Cliffton Asters did flexible sigmoidoscopy.  Noted the anastomosis was at 13 cm from the anal verge consistent with the proximal rectum.  Intact anastomosis without active bleeding.  No mucosal  ischemia.  There was a negative air leak test. There was no tension of mesentery or bowel at the anastomosis.   Tissues looked viable.  Ureters & bowel uninjured.  The anastomosis looked healthy.  Greater omentum positioned down into the pelvis to help protect the anastomosis.  I removed CO2 gas out through the ports.  Ports and will protector and instruments removed.  We changed gown and gloves.  Sterile unused instruments were used from this point out per colon SSI prevention protocol.  I closed the 5mm port sites using Monocryl stitch and sterile dressing.  I closed the fascia of the abdominal wall ostomy wound using #1 PDS transversely since that seemed to have the least tension.  Closed the colostomy wound using 2-0 Vicryl pursestring sutures deeply at Scarpa's and at the dermis.  Did another subcuticular pursestring on the dermis to tighten the area down since it seemed to want a come together circumferentially.  I placed antibiotic-soaked 1/4" NuGauze wick into the closure at the corners x2. I placed a sterile dressing.    Patient is extubated & in the PACU recovery room. I discussed postop care with the patient in detail the office & in the holding area. Instructions are written.I  discussed operative findings, updated the patient's status, discussed probable steps to recovery, and gave postoperative recommendations to the patient's daughter, Trevel Dillenbeck .  Recommendations were made.  Questions were answered.  She expressed understanding & appreciation.    [name], M.D., F.A.C.S. Gastrointestinal and Minimally Invasive Surgery [facility] [address] [place], [place] [address] [phone] Main / Paging

## 2023-06-30 NOTE — Discharge Instructions (Addendum)
 SURGERY: POST OP INSTRUCTIONS (Surgery for small bowel obstruction, colon resection, etc)   ######################################################################  EAT Gradually transition to a high fiber diet with a fiber supplement over the next few days after discharge  WALK Walk an hour a day.  Control your pain to do that.    CONTROL PAIN Control pain so that you can walk, sleep, tolerate sneezing/coughing, go up/down stairs.  HAVE A BOWEL MOVEMENT DAILY Keep your bowels regular to avoid problems.  OK to try a laxative to override constipation.  OK to use an antidairrheal to slow down diarrhea.  Call if not better after 2 tries  CALL IF YOU HAVE PROBLEMS/CONCERNS Call if you are still struggling despite following these instructions. Call if you have concerns not answered by these instructions  ######################################################################   DIET Follow a light diet the first few days at home.  Start with a bland diet such as soups, liquids, starchy foods, low fat foods, etc.  If you feel full, bloated, or constipated, stay on a ful liquid or pureed/blenderized diet for a few days until you feel better and no longer constipated. Be sure to drink plenty of fluids every day to avoid getting dehydrated (feeling dizzy, not urinating, etc.). Gradually add a fiber supplement to your diet over the next week.  Gradually get back to a regular solid diet.  Avoid fast food or heavy meals the first week as you are more likely to get nauseated. It is expected for your digestive tract to need a few months to get back to normal.  It is common for your bowel movements and stools to be irregular.  You will have occasional bloating and cramping that should eventually fade away.  Until you are eating solid food normally, off all pain medications, and back to regular activities; your bowels will not be normal. Focus on eating a low-fat, high fiber diet the rest of your life  (See Getting to Good Bowel Health, below).  CARE of your INCISION or WOUND  It is good for closed incisions and even open wounds to be washed every day.  Shower every day.  Short baths are fine.  Wash the incisions and wounds clean with soap & water.    You may leave closed incisions open to air if it is dry.   You may cover the old ostomy wound in your left abdomen to air if it is dry or cover with a Band-Aid/clean gauze & replace it after your daily shower for comfort.   ACTIVITIES as tolerated Start light daily activities --- self-care, walking, climbing stairs-- beginning the day after surgery.  Gradually increase activities as tolerated.  Control your pain to be active.  Stop when you are tired.  Ideally, walk several times a day, eventually an hour a day.   Most people are back to most day-to-day activities in a few weeks.  It takes 4-8 weeks to get back to unrestricted, intense activity. If you can walk 30 minutes without difficulty, it is safe to try more intense activity such as jogging, treadmill, bicycling, low-impact aerobics, swimming, etc. Save the most intensive and strenuous activity for last (Usually 4-8 weeks after surgery) such as sit-ups, heavy lifting, contact sports, etc.  Refrain from any intense heavy lifting or straining until you are off narcotics for pain control.  You will have off days, but things should improve week-by-week. DO NOT PUSH THROUGH PAIN.  Let pain be your guide: If it hurts to do something, don't do it.  Pain is your body warning you to avoid that activity for another week until the pain goes down. You may drive when you are no longer taking narcotic prescription pain medication, you can comfortably wear a seatbelt, and you can safely make sudden turns/stops to protect yourself without hesitating due to pain. You may have sexual intercourse when it is comfortable. If it hurts to do something, stop.   MEDICATIONS Take your usually prescribed home  medications unless otherwise directed.    Blood thinners:  You can restart any strong blood thinners after the second postoperative day  for example: COUMADIN (warfarin), XERELTO (rivaroxaban), ELIQUIS (apixaban), PLAVIX (clopidigrel), BRILINTA (ticagrelor), EFFIENT (prasugrel), PRADAXA (dabigatran), etc  Continue aspirin before & after surgery..     Some oozing/bleeding the first 1-2 weeks is common but should taper down & be small volume.    If you are passing many large clots or having uncontrolling bleeding, call your surgeon    PAIN CONTROL Pain after surgery or related to activity is often due to strain/injury to muscle, tendon, nerves and/or incisions.  This pain is usually short-term and will improve in a few months.  To help speed the process of healing and to get back to regular activity more quickly, DO THE FOLLOWING THINGS TOGETHER: Increase activity gradually.  DO NOT PUSH THROUGH PAIN Use Ice and/or Heat Try Gentle Massage and/or Stretching Take over the counter pain medication Take Narcotic prescription pain medication for more severe pain  Good pain control = faster recovery.  It is better to take more medicine to be more active than to stay in bed all day to avoid medications.  Increase activity gradually Avoid heavy lifting at first, then increase to lifting as tolerated over the next 6 weeks. Do not "push through" the pain.  Listen to your body and avoid positions and maneuvers than reproduce the pain.  Wait a few days before trying something more intense Walking an hour a day is encouraged to help your body recover faster and more safely.  Start slowly and stop when getting sore.  If you can walk 30 minutes without stopping or pain, you can try more intense activity (running, jogging, aerobics, cycling, swimming, treadmill, sex, sports, weightlifting, etc.) Remember: If it hurts to do it, then don't do it! Use Ice and/or Heat You will have swelling and bruising  around the incisions.  This will take several weeks to resolve. Ice packs or heating pads (6-8 times a day, 30-60 minutes at a time) will help sooth soreness & bruising. Some people prefer to use ice alone, heat alone, or alternate between ice & heat.  Experiment and see what works best for you.  Consider trying ice for the first few days to help decrease swelling and bruising; then, switch to heat to help relax sore spots and speed recovery. Shower every day.  Short baths are fine.  It feels good!  Keep the incisions and wounds clean with soap & water.   Try Gentle Massage and/or Stretching Massage at the area of pain many times a day Stop if you feel pain - do not overdo it Take over the counter pain medication This helps the muscle and nerve tissues become less irritable and calm down faster Choose ONE of the following over-the-counter anti-inflammatory medications: Acetaminophen 500mg  tabs (Tylenol) 1-2 pills with every meal and just before bedtime (avoid if you have liver problems or if you have acetaminophen in you narcotic prescription) Naproxen 220mg  tabs (ex. Aleve, Naprosyn) 1-2 pills  twice a day (avoid if you have kidney, stomach, IBD, or bleeding problems) Ibuprofen 200mg  tabs (ex. Advil, Motrin) 3-4 pills with every meal and just before bedtime (avoid if you have kidney, stomach, IBD, or bleeding problems) Take with food/snack several times a day as directed for at least 2 weeks to help keep pain / soreness down & more manageable. Take Narcotic prescription pain medication for more severe pain A prescription for strong pain control is often given to you upon discharge (for example: oxycodone/Percocet, hydrocodone/Norco/Vicodin, or tramadol/Ultram) Take your pain medication as prescribed. Be mindful that most narcotic prescriptions contain Tylenol (acetaminophen) as well - avoid taking too much Tylenol. If you are having problems/concerns with the prescription medicine (does not control  pain, nausea, vomiting, rash, itching, etc.), please call us [phone] to see if we need to switch you to a different pain medicine that will work better for you and/or control your side effects better. If you need a refill on your pain medication, you must call the office before 4 pm and on weekdays only.  By federal law, prescriptions for narcotics cannot be called into a pharmacy.  They must be filled out on paper & picked up from our office by the patient or authorized caretaker.  Prescriptions cannot be filled after 4 pm nor on weekends.    WHEN TO CALL us [phone] Severe uncontrolled or worsening pain  Fever over 101 F (38.5 C) Concerns with the incision: Worsening pain, redness, rash/hives, swelling, bleeding, or drainage Reactions / problems with new medications (itching, rash, hives, nausea, etc.) Nausea and/or vomiting Difficulty urinating Difficulty breathing Worsening fatigue, dizziness, lightheadedness, blurred vision Other concerns If you are not getting better after two weeks or are noticing you are getting worse, contact our office [phone] for further advice.  We may need to adjust your medications, re-evaluate you in the office, send you to the emergency room, or see what other things we can do to help. The clinic staff is available to answer your questions during regular business hours (8:30am-5pm).  Please don't hesitate to call and ask to speak to one of our nurses for clinical concerns.    A surgeon from [facility] is always on call at the hospitals 24 hours/day If you have a medical emergency, go to the nearest emergency room or call 911.  FOLLOW UP in our office One the day of your discharge from the hospital (or the next business weekday), please call [facility] to set up or confirm an appointment to see your surgeon in the office for a follow-up appointment.  Usually it is 2-3 weeks after your surgery.   If you have skin  staples at your incision(s), let the office know so we can set up a time in the office for the nurse to remove them (usually around 10 days after surgery). Make sure that you call for appointments the day of discharge (or the next business weekday) from the hospital to ensure a convenient appointment time. IF YOU HAVE DISABILITY OR FAMILY LEAVE FORMS, BRING THEM TO THE OFFICE FOR PROCESSING.  DO NOT GIVE THEM TO YOUR DOCTOR.  [facility] [address], [place], [place]  [address] ? [phone] - Main [phone] - Toll Free,  [phone] - Fax [email or web address]    GETTING TO GOOD BOWEL HEALTH. It is expected for your digestive tract to need a few months to get back to normal.  It is common  for your bowel movements and stools to be irregular.  You will have occasional bloating and cramping that should eventually fade away.  Until you are eating solid food normally, off all pain medications, and back to regular activities; your bowels will not be normal.   Avoiding constipation The goal: ONE SOFT BOWEL MOVEMENT A DAY!    Drink plenty of fluids.  Choose water first. TAKE A FIBER SUPPLEMENT EVERY DAY THE REST OF YOUR LIFE During your first week back home, gradually add back a fiber supplement every day Experiment which form you can tolerate.   There are many forms such as powders, tablets, wafers, gummies, etc Psyllium bran (Metamucil), methylcellulose (Citrucel), Miralax or Glycolax, Benefiber, Flax Seed.  Adjust the dose week-by-week (1/2 dose/day to 6 doses a day) until you are moving your bowels 1-2 times a day.  Cut back the dose or try a different fiber product if it is giving you problems such as diarrhea or bloating. Sometimes a laxative is needed to help jump-start bowels if constipated until the fiber supplement can help regulate your bowels.  If you are tolerating eating & you are farting, it is okay to try a gentle laxative such as  double dose MiraLax, prune juice, or Milk of Magnesia.  Avoid using laxatives too often. Stool softeners can sometimes help counteract the constipating effects of narcotic pain medicines.  It can also cause diarrhea, so avoid using for too long. If you are still constipated despite taking fiber daily, eating solids, and a few doses of laxatives, call our office. Controlling diarrhea Try drinking liquids and eating bland foods for a few days to avoid stressing your intestines further. Avoid dairy products (especially milk & ice cream) for a short time.  The intestines often can lose the ability to digest lactose when stressed. Avoid foods that cause gassiness or bloating.  Typical foods include beans and other legumes, cabbage, broccoli, and dairy foods.  Avoid greasy, spicy, fast foods.  Every person has some sensitivity to other foods, so listen to your body and avoid those foods that trigger problems for you. Probiotics (such as active yogurt, Align, etc) may help repopulate the intestines and colon with normal bacteria and calm down a sensitive digestive tract Adding a fiber supplement gradually can help thicken stools by absorbing excess fluid and retrain the intestines to act more normally.  Slowly increase the dose over a few weeks.  Too much fiber too soon can backfire and cause cramping & bloating. It is okay to try and slow down diarrhea with a few doses of antidiarrheal medicines.   Bismuth subsalicylate (ex. Kayopectate, Pepto Bismol) for a few doses can help control diarrhea.  Avoid if pregnant.   Loperamide (Imodium) can slow down diarrhea.  Start with one tablet (2mg ) first.  Avoid if you are having fevers or severe pain.  ILEOSTOMY PATIENTS WILL HAVE CHRONIC DIARRHEA since their colon is not in use.    Drink plenty of liquids.  You will need to drink even more glasses of water/liquid a day to avoid getting dehydrated. Record output from your ileostomy.  Expect to empty the bag every 3-4  hours at first.  Most people with a permanent ileostomy empty their bag 4-6 times at the least.   Use antidiarrheal medicine (especially Imodium) several times a day to avoid getting dehydrated.  Start with a dose at bedtime & breakfast.  Adjust up or down as needed.  Increase antidiarrheal medications as directed to avoid emptying the  bag more than 8 times a day (every 3 hours). Work with your wound ostomy nurse to learn care for your ostomy.  See ostomy care instructions. TROUBLESHOOTING IRREGULAR BOWELS 1) Start with a soft & bland diet. No spicy, greasy, or fried foods.  2) Avoid gluten/wheat or dairy products from diet to see if symptoms improve. 3) Miralax 17gm or flax seed mixed in 8oz. water or juice-daily. May use 2-4 times a day as needed. 4) Gas-X, Phazyme, etc. as needed for gas & bloating.  5) Prilosec (omeprazole) over-the-counter as needed 6)  Consider probiotics (Align, Activa, etc) to help calm the bowels down  Call your doctor if you are getting worse or not getting better.  Sometimes further testing (cultures, endoscopy, X-ray studies, CT scans, bloodwork, etc.) may be needed to help diagnose and treat the cause of the diarrhea. [facility] [address], [address], [place], [place]  [address] [phone] - Main.    [phone]  - Toll Free.   [phone] - Fax [email or web address]

## 2023-06-30 NOTE — Interval H&P Note (Signed)
 History and Physical Interval Note:  06/30/2023 7:10 AM  [name]  has presented today for surgery, with the diagnosis of COLOSTOMY FOR COLON RESECTION DESIRE FOR OSTOMY TAKEDOWN.  The various methods of treatment have been discussed with the patient and family. After consideration of risks, benefits and other options for treatment, the patient has consented to  Procedure(s): XI ROBOTIC ASSISTED OSTOMY TAKEDOWN (N/A) LAPAROTOMY, FOR LYSIS OF ADHESIONS (N/A) RIGID PROCTOSCOPY (N/A) CYSTOSCOPY with FIREFLY INJECTION (N/A) as a surgical intervention.  The patient's history has been reviewed, patient examined, no change in status, stable for surgery.  I have reviewed the patient's chart and labs.  Questions were answered to the patient's satisfaction.     [name]

## 2023-06-30 NOTE — Transfer of Care (Addendum)
 Immediate Anesthesia Transfer of Care Note  Patient: [name]  Procedure(s) Performed: XI ROBOTIC ASSISTED OSTOMY TAKEDOWN, PARTIAL RECTOSIGMOID RESECTION, BILATERAL TAP BLOCK (Abdomen) LAPAROSCOPIC LYSIS OF ADHESIONS (Abdomen) Flexible sigmoidoscopy (Rectum) CYSTOSCOPY with FIREFLY INJECTION  Patient Location: PACU  Anesthesia Type:General  Level of Consciousness: awake, alert , and oriented  Airway & Oxygen Therapy: Patient Spontanous Breathing  Post-op Assessment: Post -op Vital signs reviewed and stable  Post vital signs: Reviewed and stable  Last Vitals:  Vitals Value Taken Time  BP 143/73 06/30/23 1105  Temp    Pulse 69 06/30/23 1110  Resp 10 06/30/23 1110  SpO2 100 % 06/30/23 1110  Vitals shown include unfiled device data.  Last Pain:  Vitals:   06/30/23 0622  TempSrc:   PainSc: 0-No pain         Complications: No notable events documented.

## 2023-06-30 NOTE — H&P (Signed)
 06/30/2023     REFERRING PHYSICIAN: Self  Patient Care Team: Leonia Reader, Janae Sauce, MD as PCP - General (Internal Medicine) Diamantina Monks, MD as Consulting Provider (General Surgery) Misenheimer, Tollie Pizza, MD (Gastroenterology) Mena Goes Lowella Petties, MD (Urology) Michaell Cowing, Shawn Route, MD as Consulting Provider (Colon and Rectal Surgery) Georgeanna Lea, MD (Cardiovascular Disease)  PROVIDER: Jarrett Soho, MD  [facility] MRN: [id number] DOB: 1946-03-24  SUBJECTIVE   Chief Complaint: Follow-up (NEW PROBLEM - Discuss ostomy reversal)   [name] is a 78 y.o. male  who is seen today as an office consultation  at the request of DrBedelia Person  for evaluation of need for emergency [facility] surgery. Request for colostomy takedown minimally invasive.  History of Present Illness:  78 year old male with numerous health issues. Coronary disease with stenting. Ischemic cardiomyopathy with EF 25% followed by Dr. Gwen Pounds with cardiology. November 2023. Had mild perfusion Lexiscan showing ejection fraction 36% June 2024. Aortobifem bypass 1981 femorofemoral. Followed by vascular surgery. Hypertension diabetes. COPD. Finally abstinent tobacco 2022. Developed diverticulitis with an abscess in December 2023. Had drain placement. Seem to improve but then felt worse. Came for outside hospital a lot of these issues and ultimately required [facility] resection May 24, 2022 by Dr. Bedelia Person. Slowly improved. Close down. Wish to consider colostomy takedown. Was interested in robotic takedown. He is here with his girlfriend. Dr. Maisie Fus did surgery on her robotically and she agrees on proceeding with that. Internal referral made to see me.  Patient comes a with his girlfriend. He usually keeps the colostomy appliance on for a week. Empties the bag about twice a day. He does have a little bit of anal seepage which is new. He never had that problem prior to his [facility] resection. He wears diapers  just in case. No history of stroke or ulcers or IBS with diarrhea. Usually moves his bowels once or twice a day. No incontinence to flatus or stool preoperatively. No history of pelvic trauma. He does have a history of BPH followed by Dr. Mena Goes with [facility]. Some nocturia. No prior pelvic radiation. He claims he can walk about a football field before he gets tired and has to stop. Records note he is on Xarelto but he tells me he is just on aspirin only.  Medical History:  Past Medical History:  Diagnosis Date  Arthritis  CHF (congestive heart failure) (CMS/HHS-HCC)  Diabetes mellitus without complication (CMS/HHS-HCC)  Hypertension   Patient Active Problem List  Diagnosis  Dyssynergic defecation  History of colonic diverticulitis  Status post Hartmann's procedure (CMS/HHS-HCC)  Colostomy in place (CMS/HHS-HCC)  Atherosclerosis of native artery of extremity with intermittent claudication (CMS-HCC)  Benign prostatic hyperplasia with urinary frequency  Chronic combined systolic (congestive) and diastolic (congestive) heart failure (CMS/HHS-HCC)  Coronary artery disease involving native coronary artery of native heart with unstable angina pectoris (CMS/HHS-HCC)  History of bladder cancer  Essential hypertension  Peripheral vascular disease (CMS-HCC)  Type 2 diabetes mellitus (CMS/HHS-HCC)  PVC's (premature ventricular contractions)  History of non-insulin dependent type 2 diabetes mellitus   Past Surgical History:  Procedure Laterality Date  angioplasty surgery  COLON SURGERY  valve surgery    Allergies  Allergen Reactions  Clarithromycin Other (See Comments)  ask  Lisinopril Swelling   Current Outpatient Medications on File Prior to Visit  Medication Sig Dispense Refill  amLODIPine (NORVASC) 5 MG tablet Take 5 mg by mouth once daily  aspirin 81 MG EC tablet Take by mouth  carvediloL (COREG)  12.5 MG tablet Take by mouth  cetirizine (ZYRTEC) 10 MG tablet Take 10  mg by mouth once daily  ENTRESTO 24-26 mg tablet Take 1 tablet by mouth 2 (two) times daily  ezetimibe (ZETIA) 10 mg tablet Take 1 tablet by mouth once daily  glipiZIDE (GLUCOTROL) 10 MG tablet Take by mouth  rosuvastatin (CRESTOR) 20 MG tablet Take 1 tablet by mouth once daily   No current facility-administered medications on file prior to visit.   Family History  Problem Relation Age of Onset  Diabetes Mother  Coronary Artery Disease (Blocked arteries around heart) Mother  High blood pressure (Hypertension) Mother  Hyperlipidemia (Elevated cholesterol) Mother  High blood pressure (Hypertension) Father  Hyperlipidemia (Elevated cholesterol) Father  Diabetes Father  Coronary Artery Disease (Blocked arteries around heart) Father  Obesity Sister  High blood pressure (Hypertension) Sister  Hyperlipidemia (Elevated cholesterol) Sister  Diabetes Sister  Coronary Artery Disease (Blocked arteries around heart) Sister  High blood pressure (Hypertension) Brother  Diabetes Brother  High blood pressure (Hypertension) Other    Social History   Tobacco Use  Smoking Status Former  Types: Cigarettes  Smokeless Tobacco Never    Social History   Socioeconomic History  Marital status: Single  Tobacco Use  Smoking status: Former  Types: Cigarettes  Smokeless tobacco: Never  Vaping Use  Vaping status: Never Used  Substance and Sexual Activity  Alcohol use: Yes  Drug use: Never   Social Drivers of Architectural technologist Insecurity: No Food Insecurity (05/24/2022)  Received from [facility] [place]  Hunger Vital Sign  Worried About Running Out of Food in the Last Year: Never true  Ran Out of Food in the Last Year: Never true  Transportation Needs: No Transportation Needs (05/24/2022)  Received from [facility] [place]  PRAPARE - Transportation  Lack of Transportation (Medical): No  Lack of Transportation (Non-Medical): No    ############################################################  Review of Systems: A complete review of systems (ROS) was obtained from the patient.  We have reviewed this information and discussed as appropriate with the patient.  See HPI as well for other pertinent ROS.  Constitutional: No fevers, chills, sweats. Weight stable Eyes: No vision changes, No discharge HENT: No sore throats, nasal drainage Lymph: No neck swelling, No bruising easily Pulmonary: No cough, productive sputum CV: No orthopnea, PND . No exertional chest/neck/shoulder/arm pain. Patient can walk 1/4 mile gradually.   GI: No personal nor family history of GI/colon cancer, inflammatory bowel disease, irritable bowel syndrome, allergy such as Celiac Sprue, dietary/dairy problems, colitis, ulcers nor gastritis. No recent sick contacts/gastroenteritis. No travel outside the country. No changes in diet.  Renal: No UTIs, No hematuria Genital: No drainage, bleeding, masses Musculoskeletal: No severe joint pain. Good ROM major joints Skin: No sores or lesions Heme/Lymph: No easy bleeding. No swollen lymph nodes Neuro: No active seizures. No facial droop Psych: No hallucinations. No agitation  OBJECTIVE   There were no vitals filed for this visit.  There is no height or weight on file to calculate BMI.  PHYSICAL EXAM:  Constitutional: Not cachectic. Hygeine adequate. Vitals signs as above.  Eyes: No glasses. Vision adequate,Pupils reactive, normal extraocular movements. Sclera nonicteric Neuro: CN II-XII intact. No major focal sensory defects. No major motor deficits. Lymph: No head/neck/groin lymphadenopathy Psych: No severe agitation. No severe anxiety. Judgment & insight Adequate, Oriented x4, HENT: Normocephalic, Mucus membranes moist. No thrush. Hearing: adequate Neck: Supple, No tracheal deviation. No obvious thyromegaly Chest: No pain to chest wall  compression. Good respiratory excursion. No audible  wheezing CV: Pulses intact. regular. No major extremity edema Ext: No obvious deformity or contracture. Edema: Not present. No cyanosis Skin: No major subcutaneous nodules. Warm and dry Musculoskeletal: Severe joint rigidity not present. No obvious clubbing. No digital petechiae. Mobility: no assist device moving easily without restrictions  Abdomen: Obese Soft. Nondistended. Nontender. Hernia: Not present. Diastasis recti: Mild supraumbilical midline. No hepatomegaly. No splenomegaly. This may left lower quadrant with intact pouch and no major parastomal hernia. Midline incision intact with some thinning and stretching but no definite hernia  Genital/Pelvic: Inguinal hernia: Not present. Inguinal lymph nodes: without lymphadenopathy nor hidradenitis.   Rectal:  Perianal skin Clean with good hygiene  Pruritis ani: Not present Pilonidal disease: Not present Condyloma / warts: Not present  Anal fissure: Not present Perirectal abscess/fistula Not present External hemorrhoids Not present  Digital and anoscopic rectal exam Barely tolerated  Sphincter tone mild decrease squeeze but pretty intact overall. Hemorrhoidal piles Grade 1 normal Prostate: Enlarged but smooth Rectovaginal septum: N/A Rectal masses: Not present  Other significant findings: N/A  Patient examined with patient in decubitus position .  ###################################    ###################################################################  Labs, Imaging and Diagnostic Testing:  Located in 'Care Everywhere' section of Epic EMR chart  Anal manometry done by Dr. Allena Katz in [place].  Balloon Expulsion Test: Able to expel a 50cc balloon in 1 minute? No   Impression:   1) Decreased resting and squeeze pressures contributing to seepage and raising concern for potential fecal incontinence 2) Findings consistent with Type II dyssynergic defecation; weak rectal force with paradoxical anal sphincter contraction 3)  Unable to expel rectal balloon in allotted time 4) Rectal hypersensitivity  Recommendation:  1) Recommend pelvic Floor PT with biofeedback therapy 2) Surgical planning and further treatment per referring MD  Joslyn Hy, MD Dept of Gastroenterology   [facility] NOTES:  Located in 'Care Everywhere' section of Epic EMR chart  SURGERY NOTES:  Located in 'Care Everywhere' section of Epic EMR chart  PATHOLOGY:  Located in 'Care Everywhere' section of Epic EMR chart  Assessment and Plan:  DIAGNOSES:  Diagnoses and all orders for this visit:  Colostomy in place (CMS/HHS-HCC)  Status post Hartmann's procedure (CMS/HHS-HCC)  History of colonic diverticulitis  Peripheral vascular disease (CMS-HCC)  Coronary artery disease involving native coronary artery of native heart with unstable angina pectoris (CMS/HHS-HCC)  Chronic combined systolic (congestive) and diastolic (congestive) heart failure (CMS/HHS-HCC)  History of bladder cancer  History of non-insulin dependent type 2 diabetes mellitus  Other orders - polyethylene glycol (MIRALAX) powder; Take 233.75 g by mouth once for 1 dose Take according to your procedure prep instructions. - bisacodyL (DULCOLAX) 5 mg EC tablet; Take 4 tablets (20 mg total) by mouth once daily as needed for Constipation for up to 1 dose - metroNIDAZOLE (FLAGYL) 500 MG tablet; Take 2 tablets (1,000 mg total) by mouth 3 (three) times daily for 3 doses SEE BOWEL PREP INSTRUCTIONS: Take 2 tablets at 2pm, 3pm, and 10pm the day prior to your colon operation. - neomycin 500 mg tablet; Take 2 tablets (1,000 mg total) by mouth 3 (three) times daily for 3 doses SEE BOWEL PREP INSTRUCTIONS: Take 2 tablets at 2pm, 3pm, and 10pm the day prior to your colon operation.    ASSESSMENT/PLAN  Recovering status post Hartmann resection for complicated diverticulitis that failed nonoperative management with antibiotics and drains.  He tells me the colonoscopy by  Dr. Jennye Boroughs last month was underwhelming  He is past the point where to be safe to consider colostomy takedown. Reasonable for robotic approach.    The anatomy & physiology of the digestive tract was discussed. The pathophysiology was discussed. Possibility of remaining with an ostomy permanently was discussed. I offered ostomy takedown. Laparoscopic & open techniques were discussed.   Risks such as bleeding, infection, abscess, leak, reoperation, possible re-ostomy, injury to other organs, need for repair of tissues / organs, need for further treatment, hernia, heart attack, death, and other risks were discussed. I noted a good likelihood this will help address the problem. Goals of post-operative recovery were discussed as well. We will work to minimize complications. Questions were answered. The patient expresses understanding & wishes to proceed with surgery.  Given his history of bladder cancer and his abscess being retroperitoneal on the left side near his ureter, would like to get urology cystoscopy and firefly at the stop the case to help identify and protect the genitourinary anatomy. Patient of Dr. Estil Daft for his BPH and nocturia issues  He does have decreased ejection fraction coronary disease followed by Dr. Bing Matter. Some chronic ischemic cardiomyopathy. I need cardiac clearance from Dr. Shawnie Pons. Last note in November talked about considering a potential ICD. Would lean towards doing that if that lowers operative risks but will defer to Dr. Bing Matter. Patient did get through the bigger open operation without issue so hopefully that is reassuring.  He tells me he is just on aspirin only which seems rather surprising given his coronary and peripheral arterial disease. [facility] records seem to have had him on Xerelto. Looking at pharmacy records, it sounds like he just picked up some Plavix so I do not know really what he is on and neither does he. Just need to make sure it is  appropriate to hold it. 2 days for Xarelto, 5 days for Plavix is pretty standard. Get feedback from his cardiologist.  Anal manometry raise the concern of decreased defecation function since he cannot expel the balloon but this man does not have a history of stercoral ulcers or defecation problems or fecal incontinence so I wonder if there is an over read. Dr. Maisie Fus, colorectal colleague with the strongest expertise in fecal continence, agrees. Will send a pelvic for physical therapy just for objective evaluation potential biofeedback improvement. He seems to have pretty decent sphincter function and he did not have any full incontinence to stool or flatus prior to his [facility] resection for the past 77 years, arguing against a major issue. I think he would rather take the risk of any leaking afterwards but it did not seem to be severe issue.

## 2023-06-30 NOTE — Op Note (Signed)
 Preoperative diagnosis:  Pelvic Abscess   Diverticulitis Postoperative diagnosis:  Same   Procedure: Cystoscopy Instillation of ureteral firefly constrast   Surgeon: Crist Fat, MD   Anesthesia: General   Complications: None   Intraoperative findings:  Normal urethra/prostatic urethra, normal bladder, orthotopic ureteral orifices bilaterally   EBL: Minimal   Specimens: None   Indication:  [name]  is a 78 y.o.  patient with a colostomy presents for colostomy reversal.  Dr. Michaell Cowing requested cystoscopy and instillation of firefly contrast to help facilitate the dissection of the sigmoid colon.  After reviewing the management options for treatment, he elected to proceed with the above surgical procedure(s). We have discussed the potential benefits and risks of the procedure, side effects of the proposed treatment, the likelihood of the patient achieving the goals of the procedure, and any potential problems that might occur during the procedure or recuperation. Informed consent has been obtained.   Description of procedure:   The patient was taken to the operating room and general anesthesia was induced.  The patient was placed in the dorsal lithotomy position, prepped and draped in the usual sterile fashion, and preoperative antibiotics were administered. A preoperative time-out was performed.    A 21 French 30 degree cystoscope was gently passed through the patient's urethra into the bladder.  The bladder was subsequently emptied and then filled slowly up performing a 360 degrees cystoscopic evaluation.  This demonstrated orthotopic ureteral orifices, normal bladder mucosa with an area of heaped mucosa and bullous edema in the dome -  evidence of colovesical fistula without mucosal abnormality.   I then advanced a 5 [place] open-ended ureteral catheter into the patient's left ureteral orifice and  I then advanced the catheter up into the proximal ureter and then slowly  pulled back and injected 7.44ml of the firefly contrast.  Subsequently turned my attention to the patient's right ureteral orifice and performed a similar task.   I then  placed a 16 [place] Foley.    The surgery was then turned over to Dr. Michaell Cowing for facilitation of the remainder of the case.

## 2023-07-01 LAB — GLUCOSE, CAPILLARY
Glucose-Capillary: 101 mg/dL — ABNORMAL HIGH (ref 70–99)
Glucose-Capillary: 153 mg/dL — ABNORMAL HIGH (ref 70–99)
Glucose-Capillary: 169 mg/dL — ABNORMAL HIGH (ref 70–99)
Glucose-Capillary: 164 mg/dL — ABNORMAL HIGH (ref 70–99)

## 2023-07-01 LAB — CBC
HCT: 38.6 % — ABNORMAL LOW (ref 39.0–52.0)
Hemoglobin: 11.6 g/dL — ABNORMAL LOW (ref 13.0–17.0)
MCH: 27.8 pg (ref 26.0–34.0)
MCHC: 30.1 g/dL (ref 30.0–36.0)
MCV: 92.6 fL (ref 80.0–100.0)
Platelets: 184 10*3/uL (ref 150–400)
RBC: 4.17 MIL/uL — ABNORMAL LOW (ref 4.22–5.81)
RDW: 15.4 % (ref 11.5–15.5)
WBC: 10.8 10*3/uL — ABNORMAL HIGH (ref 4.0–10.5)
nRBC: 0 % (ref 0.0–0.2)

## 2023-07-01 LAB — BASIC METABOLIC PANEL
Anion gap: 6 (ref 5–15)
BUN: 23 mg/dL (ref 8–23)
CO2: 20 mmol/L — ABNORMAL LOW (ref 22–32)
Calcium: 8.3 mg/dL — ABNORMAL LOW (ref 8.9–10.3)
Chloride: 107 mmol/L (ref 98–111)
Creatinine, Ser: 1.74 mg/dL — ABNORMAL HIGH (ref 0.61–1.24)
GFR, Estimated: 40 mL/min — ABNORMAL LOW (ref >60)
Glucose, Bld: 108 mg/dL — ABNORMAL HIGH (ref 70–99)
Potassium: 4.3 mmol/L (ref 3.5–5.1)
Sodium: 133 mmol/L — ABNORMAL LOW (ref 135–145)

## 2023-07-01 LAB — MAGNESIUM: Magnesium: 1.7 mg/dL (ref 1.7–2.4)

## 2023-07-01 MED ORDER — SODIUM CHLORIDE 0.9 % IV BOLUS
500.0000 mL | Freq: Once | INTRAVENOUS | Status: AC
  Administered 2023-07-01: 500 mL via INTRAVENOUS

## 2023-07-01 NOTE — Progress Notes (Signed)
 Mobility Specialist - Progress Note   07/01/23 1529  Mobility  Activity Ambulated with assistance in hallway  Level of Assistance Standby assist, set-up cues, supervision of patient - no hands on  Assistive Device Front wheel walker  Distance Ambulated (ft) 300 ft  Range of Motion/Exercises Active  Activity Response Tolerated well  Mobility Referral Yes  Mobility visit 1 Mobility  Mobility Specialist Start Time (ACUTE ONLY) 1515  Mobility Specialist Stop Time (ACUTE ONLY) 1529  Mobility Specialist Time Calculation (min) (ACUTE ONLY) 14 min   Pt was found in bed and agreeable to ambulate. No complaints with session. At EOS returned to bed with all needs met. Call bell in reach and family in room.  Billey Chang Mobility Specialist

## 2023-07-01 NOTE — Plan of Care (Signed)
  Problem: Education: Goal: Understanding of discharge needs will improve Outcome: Progressing Goal: Verbalization of understanding of the causes of altered bowel function will improve Outcome: Progressing   Problem: Activity: Goal: Ability to tolerate increased activity will improve Outcome: Progressing   Problem: Bowel/Gastric: Goal: Gastrointestinal status for postoperative course will improve Outcome: Progressing   Problem: Health Behavior/Discharge Planning: Goal: Identification of community resources to assist with postoperative recovery needs will improve Outcome: Progressing   Problem: Nutritional: Goal: Will attain and maintain optimal nutritional status will improve Outcome: Progressing   Problem: Clinical Measurements: Goal: Postoperative complications will be avoided or minimized Outcome: Progressing   Problem: Respiratory: Goal: Respiratory status will improve Outcome: Progressing   Problem: Skin Integrity: Goal: Will show signs of wound healing Outcome: Progressing   Problem: Education: Goal: Ability to describe self-care measures that may prevent or decrease complications (Diabetes Survival Skills Education) will improve Outcome: Progressing Goal: Individualized Educational Video(s) Outcome: Progressing   Problem: Coping: Goal: Ability to adjust to condition or change in health will improve Outcome: Progressing   Problem: Fluid Volume: Goal: Ability to maintain a balanced intake and output will improve Outcome: Progressing   Problem: Health Behavior/Discharge Planning: Goal: Ability to identify and utilize available resources and services will improve Outcome: Progressing Goal: Ability to manage health-related needs will improve Outcome: Progressing   Problem: Metabolic: Goal: Ability to maintain appropriate glucose levels will improve Outcome: Progressing   Problem: Nutritional: Goal: Maintenance of adequate nutrition will improve Outcome:  Progressing Goal: Progress toward achieving an optimal weight will improve Outcome: Progressing   Problem: Skin Integrity: Goal: Risk for impaired skin integrity will decrease Outcome: Progressing   Problem: Tissue Perfusion: Goal: Adequacy of tissue perfusion will improve Outcome: Progressing   Problem: Education: Goal: Knowledge of General Education information will improve Description: Including pain rating scale, medication(s)/side effects and non-pharmacologic comfort measures Outcome: Progressing   Problem: Health Behavior/Discharge Planning: Goal: Ability to manage health-related needs will improve Outcome: Progressing   Problem: Clinical Measurements: Goal: Ability to maintain clinical measurements within normal limits will improve Outcome: Progressing Goal: Will remain free from infection Outcome: Progressing Goal: Diagnostic test results will improve Outcome: Progressing Goal: Respiratory complications will improve Outcome: Progressing Goal: Cardiovascular complication will be avoided Outcome: Progressing   Problem: Activity: Goal: Risk for activity intolerance will decrease Outcome: Progressing   Problem: Nutrition: Goal: Adequate nutrition will be maintained Outcome: Progressing   Problem: Coping: Goal: Level of anxiety will decrease Outcome: Progressing   Problem: Elimination: Goal: Will not experience complications related to bowel motility Outcome: Progressing Goal: Will not experience complications related to urinary retention Outcome: Progressing   Problem: Pain Managment: Goal: General experience of comfort will improve and/or be controlled Outcome: Progressing   Problem: Safety: Goal: Ability to remain free from injury will improve Outcome: Progressing   Problem: Skin Integrity: Goal: Risk for impaired skin integrity will decrease Outcome: Progressing

## 2023-07-01 NOTE — Progress Notes (Signed)
 1 Day Post-Op   Subjective/Chief Complaint: Had small BM and "lots of gas."  Patient and GF report much less bloating of abdomen compared to yesterday.  Patient has been OOB to BR.    Objective: Vital signs in last 24 hours: Temp:  [97.5 F (36.4 C)-99.2 F (37.3 C)] 99.2 F (37.3 C) (03/15 0614) Pulse Rate:  [65-99] 99 (03/15 0614) Resp:  [5-18] 18 (03/15 0614) BP: (110-148)/(55-88) 135/64 (03/15 0903) SpO2:  [91 %-100 %] 94 % (03/15 0614) Arterial Line BP: (127-148)/(58-80) 145/80 (03/14 1215) Weight:  [88 kg] 88 kg (03/15 0500) Last BM Date : 06/30/23  Intake/Output from previous day: 03/14 0701 - 03/15 0700 In: 2853.2 [P.O.:500; I.V.:1963.6; IV Piggyback:389.6] Out: 1470 [Urine:1460; Blood:10] Intake/Output this shift: No intake/output data recorded.  General appearance: alert and no distress Resp: breathing comfortably GI: soft, mildly distended.  Some serosang staining on dressings.    Lab Results:  Recent Labs    07/01/23 0532  WBC 10.8*  HGB 11.6*  HCT 38.6*  PLT 184   BMET Recent Labs    07/01/23 0532  NA 133*  K 4.3  CL 107  CO2 20*  GLUCOSE 108*  BUN 23  CREATININE 1.74*  CALCIUM 8.3*   PT/INR No results for input(s): "LABPROT", "INR" in the last 72 hours. ABG No results for input(s): "PHART", "HCO3" in the last 72 hours.  Invalid input(s): "PCO2", "PO2"  Studies/Results: No results found.  Anti-infectives: Anti-infectives (From admission, onward)    Start     Dose/Rate Route Frequency Ordered Stop   06/30/23 1545  cefoTEtan (CEFOTAN) 2 g in sodium chloride 0.9 % 100 mL IVPB        2 g 200 mL/hr over 30 Minutes Intravenous Every 12 hours 06/30/23 1445 06/30/23 1648   06/30/23 0715  cefoTEtan (CEFOTAN) 2 g in sodium chloride 0.9 % 100 mL IVPB        2 g 200 mL/hr over 30 Minutes Intravenous On call to O.R. 06/30/23 4098 06/30/23 0806       Assessment/Plan: s/p Procedure(s): XI ROBOTIC ASSISTED OSTOMY TAKEDOWN, PARTIAL  RECTOSIGMOID RESECTION, BILATERAL TAP BLOCK (N/A) LAPAROSCOPIC LYSIS OF ADHESIONS (N/A) Flexible sigmoidoscopy (N/A) CYSTOSCOPY with FIREFLY INJECTION (N/A) Advance diet. Small bolus for modest bump in Cr.  (CKD) Pain control Dm- glucotrol and lantus Ambulate, IS    LOS: 1 day    Almond Lint 07/01/2023

## 2023-07-02 LAB — GLUCOSE, CAPILLARY
Glucose-Capillary: 161 mg/dL — ABNORMAL HIGH (ref 70–99)
Glucose-Capillary: 255 mg/dL — ABNORMAL HIGH (ref 70–99)
Glucose-Capillary: 241 mg/dL — ABNORMAL HIGH (ref 70–99)
Glucose-Capillary: 315 mg/dL — ABNORMAL HIGH (ref 70–99)

## 2023-07-02 NOTE — Progress Notes (Signed)
 2 Days Post-Op   Subjective/Chief Complaint: Continues to have a lot of flatus but no real bowel movement since yesterday.   Objective: Vital signs in last 24 hours: Temp:  [98.2 F (36.8 C)-98.5 F (36.9 C)] 98.2 F (36.8 C) (03/16 0545) Pulse Rate:  [89-94] 94 (03/16 0545) Resp:  [15-18] 17 (03/16 0545) BP: (105-140)/(55-73) 122/63 (03/16 0745) SpO2:  [91 %-94 %] 93 % (03/16 0545) Weight:  [86.5 kg] 86.5 kg (03/16 0500) Last BM Date : 06/30/23  Intake/Output from previous day: 03/15 0701 - 03/16 0700 In: 900 [P.O.:900] Out: 2350 [Urine:2350] Intake/Output this shift: Total I/O In: 480 [P.O.:480] Out: 0   General appearance: alert and no distress Resp: breathing comfortably GI: soft, mildly distended.  Tender superior to prior stoma site without palpable mass or hernia to suggest any issue with his fascial closure.  Dressings/wick removed and new gauze dressing placed to stoma site as well as one of his right sided port sites which had a little bit of dried blood.    Lab Results:  Recent Labs    07/01/23 0532  WBC 10.8*  HGB 11.6*  HCT 38.6*  PLT 184   BMET Recent Labs    07/01/23 0532  NA 133*  K 4.3  CL 107  CO2 20*  GLUCOSE 108*  BUN 23  CREATININE 1.74*  CALCIUM 8.3*   PT/INR No results for input(s): "LABPROT", "INR" in the last 72 hours. ABG No results for input(s): "PHART", "HCO3" in the last 72 hours.  Invalid input(s): "PCO2", "PO2"  Studies/Results: No results found.  Anti-infectives: Anti-infectives (From admission, onward)    Start     Dose/Rate Route Frequency Ordered Stop   06/30/23 1545  cefoTEtan (CEFOTAN) 2 g in sodium chloride 0.9 % 100 mL IVPB        2 g 200 mL/hr over 30 Minutes Intravenous Every 12 hours 06/30/23 1445 06/30/23 1648   06/30/23 0715  cefoTEtan (CEFOTAN) 2 g in sodium chloride 0.9 % 100 mL IVPB        2 g 200 mL/hr over 30 Minutes Intravenous On call to O.R. 06/30/23 1610 06/30/23 0806        Assessment/Plan: s/p Procedure(s): XI ROBOTIC ASSISTED OSTOMY TAKEDOWN, PARTIAL RECTOSIGMOID RESECTION, BILATERAL TAP BLOCK (N/A) LAPAROSCOPIC LYSIS OF ADHESIONS (N/A) Flexible sigmoidoscopy (N/A) CYSTOSCOPY with FIREFLY INJECTION (N/A) Advance diet.  Awaiting bowel movement prior to discharge. Small bolus Saturday for modest bump in Cr.  (CKD) recheck labs tomorrow Pain control Dm- glucotrol and lantus Ambulate, IS    LOS: 2 days    Berna Bue 07/02/2023

## 2023-07-02 NOTE — Plan of Care (Signed)
  Problem: Education: Goal: Understanding of discharge needs will improve Outcome: Progressing   Problem: Respiratory: Goal: Respiratory status will improve Outcome: Progressing   Problem: Skin Integrity: Goal: Will show signs of wound healing Outcome: Progressing   Problem: Nutritional: Goal: Maintenance of adequate nutrition will improve Outcome: Progressing   Problem: Activity: Goal: Risk for activity intolerance will decrease Outcome: Progressing   Problem: Nutrition: Goal: Adequate nutrition will be maintained Outcome: Progressing   Problem: Elimination: Goal: Will not experience complications related to bowel motility Outcome: Progressing Goal: Will not experience complications related to urinary retention Outcome: Progressing

## 2023-07-02 NOTE — Anesthesia Postprocedure Evaluation (Signed)
 Anesthesia Post Note  Patient: [name]  Procedure(s) Performed: XI ROBOTIC ASSISTED OSTOMY TAKEDOWN, PARTIAL RECTOSIGMOID RESECTION, BILATERAL TAP BLOCK (Abdomen) LAPAROSCOPIC LYSIS OF ADHESIONS (Abdomen) Flexible sigmoidoscopy (Rectum) CYSTOSCOPY with FIREFLY INJECTION     Patient location during evaluation: PACU Anesthesia Type: General Level of consciousness: awake and alert Pain management: pain level controlled Vital Signs Assessment: post-procedure vital signs reviewed and stable Respiratory status: spontaneous breathing, nonlabored ventilation and respiratory function stable Cardiovascular status: blood pressure returned to baseline and stable Postop Assessment: no apparent nausea or vomiting Anesthetic complications: no   No notable events documented.  Last Vitals:    Last Pain:                 Collene Schlichter

## 2023-07-03 ENCOUNTER — Encounter (HOSPITAL_COMMUNITY): Payer: Self-pay | Admitting: Surgery

## 2023-07-03 DIAGNOSIS — N1832 Chronic kidney disease, stage 3b: Secondary | ICD-10-CM

## 2023-07-03 HISTORY — DX: Chronic kidney disease, stage 3b: N18.32

## 2023-07-03 LAB — CBC
HCT: 37.1 % — ABNORMAL LOW (ref 39.0–52.0)
Hemoglobin: 11.7 g/dL — ABNORMAL LOW (ref 13.0–17.0)
MCH: 28.3 pg (ref 26.0–34.0)
MCHC: 31.5 g/dL (ref 30.0–36.0)
MCV: 89.8 fL (ref 80.0–100.0)
Platelets: 188 10*3/uL (ref 150–400)
RBC: 4.13 MIL/uL — ABNORMAL LOW (ref 4.22–5.81)
RDW: 14.9 % (ref 11.5–15.5)
WBC: 8.6 10*3/uL (ref 4.0–10.5)
nRBC: 0 % (ref 0.0–0.2)

## 2023-07-03 LAB — BASIC METABOLIC PANEL
Anion gap: 10 (ref 5–15)
BUN: 19 mg/dL (ref 8–23)
CO2: 22 mmol/L (ref 22–32)
Calcium: 8.9 mg/dL (ref 8.9–10.3)
Chloride: 108 mmol/L (ref 98–111)
Creatinine, Ser: 1.42 mg/dL — ABNORMAL HIGH (ref 0.61–1.24)
GFR, Estimated: 51 mL/min — ABNORMAL LOW (ref >60)
Glucose, Bld: 193 mg/dL — ABNORMAL HIGH (ref 70–99)
Potassium: 4.4 mmol/L (ref 3.5–5.1)
Sodium: 140 mmol/L (ref 135–145)

## 2023-07-03 LAB — GLUCOSE, CAPILLARY: Glucose-Capillary: 164 mg/dL — ABNORMAL HIGH (ref 70–99)

## 2023-07-03 LAB — MAGNESIUM: Magnesium: 1.9 mg/dL (ref 1.7–2.4)

## 2023-07-03 LAB — SURGICAL PATHOLOGY

## 2023-07-03 MED ORDER — EMPAGLIFLOZIN 10 MG PO TABS
10.0000 mg | ORAL_TABLET | Freq: Every day | ORAL | Status: DC
  Administered 2023-07-03: 10 mg via ORAL
  Filled 2023-07-03: qty 1

## 2023-07-03 MED ORDER — TRAMADOL HCL 50 MG PO TABS: 50.0000 mg | ORAL_TABLET | Freq: Four times a day (QID) | ORAL | 0 refills | Status: DC | PRN

## 2023-07-03 MED ORDER — RIVAROXABAN 10 MG PO TABS: 20.0000 mg | ORAL_TABLET | Freq: Every day | ORAL | Status: DC

## 2023-07-03 NOTE — Progress Notes (Signed)
 Transition of Care [facility]) - Inpatient Brief Assessment   Patient Details  Name: [name] MRN: [id number] Date of Birth: June 07, 1945  Transition of [facility] CM/SW Contact:    Amada Jupiter, LCSW Phone Number: 07/03/2023, 10:10 AM   Clinical Narrative:  No needs.  Transition of Care Asessment: Insurance and Status: Insurance coverage has been reviewed Patient has primary care physician: Yes Home environment has been reviewed: home alone Prior level of function:: independent Prior/Current Home Services: No current home services Social Drivers of Health Review: SDOH reviewed no interventions necessary Readmission risk has been reviewed: Yes Transition of care needs: no transition of care needs at this time

## 2023-07-03 NOTE — Progress Notes (Addendum)
 AVS reviewed w/ pt & s/o - both verbalized an understanding- no other questions at this time. Pt dressed for d/c. Pt has post op appt scheduled for 4/2- pt will call office to find out time.Tylenol may be taken at 12 noon- pt given schedule in pen on AVS.  PIV removed as noted- pt waiting on ride- enroute to hospital

## 2023-07-03 NOTE — Discharge Summary (Addendum)
 Physician Discharge Summary    [name] MRN: [id number] DOB/AGE: [birth year] = 78 y.o.  Patient Care Team: Leonia Reader, Barbara Cower, MD as PCP - General (Internal Medicine) Georgeanna Lea, MD as PCP - Cardiology (Cardiology) Misenheimer, Marcial Pacas, MD as Consulting Physician (Gastroenterology) Diamantina Monks, MD as Consulting Physician (Surgery) Georgeanna Lea, MD as Consulting Physician (Cardiology) Karie Soda, MD as Consulting Physician (Colon and Rectal Surgery) Jerilee Field, MD as Consulting Physician (Urology)  Admit date: 06/30/2023  Discharge date: 07/03/2023  Hospital Stay = 3 days    Discharge Diagnoses:  Principal Problem:   Diverticulitis large intestine s/p LAR/colostomy takedown 06/30/2023 Active Problems:   Atherosclerosis of native artery of extremity with intermittent claudication (HCC)   Coronary artery disease involving native coronary artery of native heart with unstable angina pectoris [facility])   Status post coronary artery stent placement   Coronary artery disease   Ischemic cardiomyopathy   Type 2 diabetes mellitus with other circulatory complications [facility])   Essential hypertension   Diverticulitis of sigmoid colon   History of bladder cancer   Sigmoid diverticulitis   CKD stage 3b, GFR 30-44 ml/min (HCC)   3 Days Post-Op  06/30/2023  POST-OPERATIVE DIAGNOSIS:   COLOSTOMY FOR COLON RESECTION DESIRE FOR OSTOMY TAKEDOWN PARASTOMAL HERNIA AT COLOSTOMY   PROCEDURE:   ROBOTIC RECTOSIGMOID RESECTION  TAKEDOWN OF END COLOSTOMY WITH ANASTOMOSIS CLOSURE OF PARASTOMAL HERNIA TRANSVERSUS ABDOMINIS PLANE (TAP) BLOCK - BILATERAL LYSIS OF ADHESIONS x90 MINUTES (66% OF CASE) FLEXIBLE SIGMOIDOSCOPY   SURGEON:  [name], MD  OR FINDINGS:   Moderately dense adhesions especially of omentum to anterior abdominal wall and numerous interloop small bowel adhesions.  Consistent with prior [facility] resection for diverticulitis with abscess and  peritonitis. No obvious metastatic disease on visceral parietal peritoneum or liver.   It is a 31mm EEA anastomosis ( distal descending colon  connected to proximal rectum.)  It rests 13 cm from the anal verge by rigid proctoscopy.   CASE DATA: Type of patient?: Elective [facility] Private Case Status of Case? Elective Scheduled Infection Present At Time Of Surgery (PATOS)?  NO  Consults: Anesthesia  Hospital Course:   The patient underwent the surgery above.  Postoperatively, the patient gradually mobilized and advanced to a solid diet.  Pain and other symptoms were treated aggressively.  Hyperglycemia controlled w SS insulin, lantus. & gradual return to all home meds  By the time of discharge, the patient was walking well the hallways, eating food, having flatus.  Pain was well-controlled on an oral medications.  Based on meeting discharge criteria and continuing to recover, I felt it was safe for the patient to be discharged from the hospital to further recover with close followup. Postoperative recommendations were discussed in detail.  They are written as well.  Discharged Condition: good  Discharge Exam: Blood pressure 136/72, pulse 88, temperature 98.2 F (36.8 C), temperature source Oral, resp. rate 16, height 5\' 7"  (1.702 m), weight 85.3 kg, SpO2 93%.  General: Pt awake/alert/oriented x4 in No acute distress Eyes: PERRL, normal EOM.  Sclera clear.  No icterus Neuro: CN II-XII intact w/o focal sensory/motor deficits. Lymph: No head/neck/groin lymphadenopathy Psych:  No delerium/psychosis/paranoia HENT: Normocephalic, Mucus membranes moist.  No thrush Neck: Supple, No tracheal deviation Chest: No chest wall pain w good excursion CV:  Pulses intact.  Regular rhythm MS: Normal AROM mjr joints.  No obvious deformity Abdomen: Soft.  Mildly distended.  Nontender.  Old colostomy wound with minimal drainage with clean  dry gauze in place.  No evidence of peritonitis.  No incarcerated  hernias. Ext:  SCDs BLE.  No mjr edema.  No cyanosis Skin: No petechiae / purpura   Disposition:    Follow-up Information     Karie Soda, MD Follow up on 07/19/2023.   Specialties: General Surgery, Colon and Rectal Surgery Why: To follow up after your operation Contact information: [address] [place] [address] [phone]                 Discharge disposition: 01-Home or Self Care       Discharge Instructions     Call MD for:   Complete by: As directed    FEVER > 101.5 F  (temperatures < 101.5 F are not significant)   Call MD for:  extreme fatigue   Complete by: As directed    Call MD for:  persistant dizziness or light-headedness   Complete by: As directed    Call MD for:  persistant nausea and vomiting   Complete by: As directed    Call MD for:  redness, tenderness, or signs of infection (pain, swelling, redness, odor or green/yellow discharge around incision site)   Complete by: As directed    Call MD for:  severe uncontrolled pain   Complete by: As directed    Diet - low sodium heart healthy   Complete by: As directed    Start with a bland diet such as soups, liquids, starchy foods, low fat foods, etc. the first few days at home. Gradually advance to a solid, low-fat, high fiber diet by the end of the first week at home.   Add a fiber supplement to your diet (Metamucil, etc) If you feel full, bloated, or constipated, stay on a full liquid or pureed/blenderized diet for a few days until you feel better and are no longer constipated.   Discharge instructions   Complete by: As directed    See Discharge Instructions If you are not getting better after two weeks or are noticing you are getting worse, contact our office [phone] for further advice.  We may need to adjust your medications, re-evaluate you in the office, send you to the emergency room, or see what other things we can do to help. The clinic staff is available to answer  your questions during regular business hours (8:30am-5pm).  Please don't hesitate to call and ask to speak to one of our nurses for clinical concerns.    A surgeon from [facility] is always on call at the hospitals 24 hours/day If you have a medical emergency, go to the nearest emergency room or call 911.   Discharge wound care:   Complete by: As directed    It is good for closed incisions and even open wounds to be washed every day.  Shower every day.  Short baths are fine.  Wash the incisions and wounds clean with soap & water.    You may leave closed incisions open to air if it is dry.   You may cover the incision with clean gauze & replace it after your daily shower for comfort.   Driving Restrictions   Complete by: As directed    You may drive when: - you are no longer taking narcotic prescription pain medication - you can comfortably wear a seatbelt - you can safely make sudden turns/stops without pain.   Increase activity slowly   Complete by: As directed    Start  light daily activities --- self-care, walking, climbing stairs- beginning the day after surgery.  Gradually increase activities as tolerated.  Control your pain to be active.  Stop when you are tired.  Ideally, walk several times a day, eventually an hour a day.   Most people are back to most day-to-day activities in a few weeks.  It takes 4-6 weeks to get back to unrestricted, intense activity. If you can walk 30 minutes without difficulty, it is safe to try more intense activity such as jogging, treadmill, bicycling, low-impact aerobics, swimming, etc. Save the most intensive and strenuous activity for last (Usually 4-8 weeks after surgery) such as sit-ups, heavy lifting, contact sports, etc.  Refrain from any intense heavy lifting or straining until you are off narcotics for pain control.  You will have off days, but things should improve week-by-week. DO NOT PUSH THROUGH PAIN.  Let pain be your guide: If it hurts  to do something, don't do it.   Lifting restrictions   Complete by: As directed    If you can walk 30 minutes without difficulty, it is safe to try more intense activity such as jogging, treadmill, bicycling, low-impact aerobics, swimming, etc. Save the most intensive and strenuous activity for last (Usually 4-8 weeks after surgery) such as sit-ups, heavy lifting, contact sports, etc.   Refrain from any intense heavy lifting or straining until you are off narcotics for pain control.  You will have off days, but things should improve week-by-week. DO NOT PUSH THROUGH PAIN.  Let pain be your guide: If it hurts to do something, don't do it.  Pain is your body warning you to avoid that activity for another week until the pain goes down.   May shower / Bathe   Complete by: As directed    May walk up steps   Complete by: As directed    Remove dressing in 48 hours   Complete by: As directed    Make sure all dressings have been removed on the second day after surgery = 3/16 Sunday Leave incisions open to air.  OK to cover incisions with gauze or bandages as desired   Sexual Activity Restrictions   Complete by: As directed    You may have sexual intercourse when it is comfortable. If it hurts to do something, stop.       Allergies as of 07/03/2023       Reactions   Lisinopril Swelling, Rash   Rash - face and tounge swell        Medication List     TAKE these medications    amLODipine 5 MG tablet Commonly known as: NORVASC Take 1 tablet (5 mg total) by mouth daily.   aspirin EC 81 MG tablet Take 1 tablet (81 mg total) by mouth daily. Swallow whole.   carvedilol 12.5 MG tablet Commonly known as: COREG Take 1 tablet (12.5 mg total) by mouth 2 (two) times daily with a meal.   cetirizine 10 MG tablet Commonly known as: ZYRTEC Take 1 tablet (10 mg total) by mouth daily.   cyclobenzaprine 10 MG tablet Commonly known as: FLEXERIL Take 1 tablet (10 mg total) by mouth 3 (three) times  daily as needed for muscle spasms.   Entresto 49-51 MG Generic drug: sacubitril-valsartan Take 1 tablet by mouth 2 (two) times daily.   ezetimibe 10 MG tablet Commonly known as: ZETIA Take 1 tablet (10 mg total) by mouth daily.   glipiZIDE 10 MG tablet Commonly known as: GLUCOTROL  TAKE 1 TABLET(10 MG) BY MOUTH TWICE DAILY   insulin glargine 100 unit/mL Sopn Commonly known as: LANTUS Start with 8 Units subcut daily.  Increase by 3 Units every 3 days until your FSBS are consistently less than 150 What changed:  how much to take how to take this when to take this additional instructions   Jardiance 10 MG Tabs tablet Generic drug: empagliflozin Take 1 tablet (10 mg total) by mouth daily.   nitroGLYCERIN 0.4 MG SL tablet Commonly known as: NITROSTAT Place 0.4 mg under the tongue every 5 (five) minutes as needed for chest pain.   rivaroxaban 20 MG Tabs tablet Commonly known as: XARELTO Take 1 tablet (20 mg total) by mouth daily with supper.   rosuvastatin 40 MG tablet Commonly known as: CRESTOR Take 1 tablet (40 mg total) by mouth daily.   tadalafil 20 MG tablet Commonly known as: CIALIS Take 10 mg by mouth.   tamsulosin 0.4 MG Caps capsule Commonly known as: FLOMAX Take 0.8 mg by mouth every evening.   traMADol 50 MG tablet Commonly known as: ULTRAM Take 1-2 tablets (50-100 mg total) by mouth every 6 (six) hours as needed for moderate pain (pain score 4-6).               Discharge Care Instructions  (From admission, onward)           Start     Ordered   07/03/23 0000  Discharge wound care:       Comments: It is good for closed incisions and even open wounds to be washed every day.  Shower every day.  Short baths are fine.  Wash the incisions and wounds clean with soap & water.    You may leave closed incisions open to air if it is dry.   You may cover the incision with clean gauze & replace it after your daily shower for comfort.   07/03/23 0820             Significant Diagnostic Studies:  Results for orders placed or performed during the hospital encounter of 06/30/23 (from the past 72 hours)  Glucose, capillary     Status: Abnormal   Collection Time: 06/30/23  8:56 AM  Result Value Ref Range   Glucose-Capillary 173 (H) 70 - 99 mg/dL    Comment: Glucose reference range applies only to samples taken after fasting for at least 8 hours.  Glucose, capillary     Status: Abnormal   Collection Time: 06/30/23 11:14 AM  Result Value Ref Range   Glucose-Capillary 189 (H) 70 - 99 mg/dL    Comment: Glucose reference range applies only to samples taken after fasting for at least 8 hours.   Comment 1 Notify RN    Comment 2 Document in Chart   Hemoglobin A1c     Status: Abnormal   Collection Time: 06/30/23 12:00 PM  Result Value Ref Range   Hgb A1c MFr Bld 10.6 (H) 4.8 - 5.6 %    Comment: (NOTE) Pre diabetes:          5.7%-6.4%  Diabetes:              >6.4%  Glycemic control for   <7.0% adults with diabetes    Mean Plasma Glucose 257.52 mg/dL    Comment: Performed at [facility], [address], [place], [place] [address]  Glucose, capillary     Status: Abnormal   Collection Time: 06/30/23 12:57 PM  Result Value Ref  Range   Glucose-Capillary 184 (H) 70 - 99 mg/dL    Comment: Glucose reference range applies only to samples taken after fasting for at least 8 hours.  Glucose, capillary     Status: Abnormal   Collection Time: 06/30/23  4:06 PM  Result Value Ref Range   Glucose-Capillary 163 (H) 70 - 99 mg/dL    Comment: Glucose reference range applies only to samples taken after fasting for at least 8 hours.  Glucose, capillary     Status: Abnormal   Collection Time: 06/30/23  8:03 PM  Result Value Ref Range   Glucose-Capillary 176 (H) 70 - 99 mg/dL    Comment: Glucose reference range applies only to samples taken after fasting for at least 8 hours.  Basic metabolic panel     Status: Abnormal   Collection Time: 07/01/23   5:32 AM  Result Value Ref Range   Sodium 133 (L) 135 - 145 mmol/L   Potassium 4.3 3.5 - 5.1 mmol/L   Chloride 107 98 - 111 mmol/L   CO2 20 (L) 22 - 32 mmol/L   Glucose, Bld 108 (H) 70 - 99 mg/dL    Comment: Glucose reference range applies only to samples taken after fasting for at least 8 hours.   BUN 23 8 - 23 mg/dL   Creatinine, Ser 4.09 (H) 0.61 - 1.24 mg/dL   Calcium 8.3 (L) 8.9 - 10.3 mg/dL   GFR, Estimated 40 (L) >60 mL/min    Comment: (NOTE) Calculated using the CKD-EPI Creatinine Equation (2021)    Anion gap 6 5 - 15    Comment: Performed at [facility] [address] [place], [place] [address]  CBC     Status: Abnormal   Collection Time: 07/01/23  5:32 AM  Result Value Ref Range   WBC 10.8 (H) 4.0 - 10.5 K/uL   RBC 4.17 (L) 4.22 - 5.81 MIL/uL   Hemoglobin 11.6 (L) 13.0 - 17.0 g/dL   HCT 47.8 (L) 29.5 - 62.1 %   MCV 92.6 80.0 - 100.0 fL   MCH 27.8 26.0 - 34.0 pg   MCHC 30.1 30.0 - 36.0 g/dL   RDW 30.8 65.7 - 84.6 %   Platelets 184 150 - 400 K/uL   nRBC 0.0 0.0 - 0.2 %    Comment: Performed at [facility], [address] [place], [place] [address]  Magnesium     Status: None   Collection Time: 07/01/23  5:32 AM  Result Value Ref Range   Magnesium 1.7 1.7 - 2.4 mg/dL    Comment: Performed at [facility], [address] [place], [place] [address]  Glucose, capillary     Status: Abnormal   Collection Time: 07/01/23  7:46 AM  Result Value Ref Range   Glucose-Capillary 101 (H) 70 - 99 mg/dL    Comment: Glucose reference range applies only to samples taken after fasting for at least 8 hours.  Glucose, capillary     Status: Abnormal   Collection Time: 07/01/23 11:57 AM  Result Value Ref Range   Glucose-Capillary 153 (H) 70 - 99 mg/dL    Comment: Glucose reference range applies only to samples taken after fasting for at least 8 hours.  Glucose, capillary     Status: Abnormal   Collection Time: 07/01/23  4:15  PM  Result Value Ref Range   Glucose-Capillary 169 (H) 70 - 99 mg/dL    Comment: Glucose reference range applies only to samples taken  after fasting for at least 8 hours.  Glucose, capillary     Status: Abnormal   Collection Time: 07/01/23  9:32 PM  Result Value Ref Range   Glucose-Capillary 164 (H) 70 - 99 mg/dL    Comment: Glucose reference range applies only to samples taken after fasting for at least 8 hours.  Glucose, capillary     Status: Abnormal   Collection Time: 07/02/23  7:24 AM  Result Value Ref Range   Glucose-Capillary 161 (H) 70 - 99 mg/dL    Comment: Glucose reference range applies only to samples taken after fasting for at least 8 hours.  Glucose, capillary     Status: Abnormal   Collection Time: 07/02/23 11:28 AM  Result Value Ref Range   Glucose-Capillary 255 (H) 70 - 99 mg/dL    Comment: Glucose reference range applies only to samples taken after fasting for at least 8 hours.  Glucose, capillary     Status: Abnormal   Collection Time: 07/02/23  4:39 PM  Result Value Ref Range   Glucose-Capillary 241 (H) 70 - 99 mg/dL    Comment: Glucose reference range applies only to samples taken after fasting for at least 8 hours.  Glucose, capillary     Status: Abnormal   Collection Time: 07/02/23  9:21 PM  Result Value Ref Range   Glucose-Capillary 315 (H) 70 - 99 mg/dL    Comment: Glucose reference range applies only to samples taken after fasting for at least 8 hours.  CBC     Status: Abnormal   Collection Time: 07/03/23  4:16 AM  Result Value Ref Range   WBC 8.6 4.0 - 10.5 K/uL   RBC 4.13 (L) 4.22 - 5.81 MIL/uL   Hemoglobin 11.7 (L) 13.0 - 17.0 g/dL   HCT 16.1 (L) 09.6 - 04.5 %   MCV 89.8 80.0 - 100.0 fL   MCH 28.3 26.0 - 34.0 pg   MCHC 31.5 30.0 - 36.0 g/dL   RDW 40.9 81.1 - 91.4 %   Platelets 188 150 - 400 K/uL   nRBC 0.0 0.0 - 0.2 %    Comment: Performed at [facility], [address], [place], [place] [address]  Basic metabolic panel      Status: Abnormal   Collection Time: 07/03/23  4:16 AM  Result Value Ref Range   Sodium 140 135 - 145 mmol/L    Comment: DELTA CHECK NOTED   Potassium 4.4 3.5 - 5.1 mmol/L   Chloride 108 98 - 111 mmol/L   CO2 22 22 - 32 mmol/L   Glucose, Bld 193 (H) 70 - 99 mg/dL    Comment: Glucose reference range applies only to samples taken after fasting for at least 8 hours.   BUN 19 8 - 23 mg/dL   Creatinine, Ser 6.21 (H) 0.61 - 1.24 mg/dL   Calcium 8.9 8.9 - 30.8 mg/dL   GFR, Estimated 51 (L) >60 mL/min    Comment: (NOTE) Calculated using the CKD-EPI Creatinine Equation (2021)    Anion gap 10 5 - 15    Comment: Performed at [facility], [address] [place], [place] [address]  Magnesium     Status: None   Collection Time: 07/03/23  4:16 AM  Result Value Ref Range   Magnesium 1.9 1.7 - 2.4 mg/dL    Comment: Performed at [facility], [address] [place], [place] [address]  Glucose, capillary     Status: Abnormal   Collection Time:  07/03/23  8:02 AM  Result Value Ref Range   Glucose-Capillary 164 (H) 70 - 99 mg/dL    Comment: Glucose reference range applies only to samples taken after fasting for at least 8 hours.    No results found.  Past Medical History:  Diagnosis Date   Acute low back pain 01/12/2015   Acute pain of both shoulders 07/22/2022   AKI (acute kidney injury) (HCC) 05/24/2022   Arthritis    Atherosclerosis of native arteries of the extremities with intermittent claudication 08/13/2013   Backache 02/08/2013   Bladder cancer (HCC)    resection x3   Chronic combined systolic (congestive) and diastolic (congestive) heart failure (HCC) 07/22/2022   Chronic right shoulder pain 09/02/2022   Chronic systolic CHF (congestive heart failure) (HCC)    Cigarette smoker    Coronary artery disease    status post DMI RX Taxus stent RCA 2006 with susequent Stent LAD and subsequent  stent thrombosis RCA unable to be opened 2006 -neg mv  10/2008, 10/16/17 ISR to pLAD with PTCA/DES, CTO of RCA with collaterals, EF 25%   Coronary artery disease involving native coronary artery of native heart with unstable angina pectoris (HCC)    Current moderate episode of major depressive disorder without prior episode (HCC) 03/28/2022   Diverticulitis 06/10/2022   Diverticulitis large intestine 05/11/2021   Diverticulitis of large intestine with abscess 05/24/2022   Diverticulitis of sigmoid colon 05/11/2021   DM2 (diabetes mellitus, type 2) (HCC) 02/09/2022   status post bilateral aortobifemoral bypass [id number] with recent fem to fembypass April  2011 per DR. Early     Essential hypertension 07/22/2022   Hypercholesterolemia 10/28/2022   HYPERLIPIDEMIA-MIXED 09/22/2008   Qualifier: Diagnosis of  By: Vikki Ports     Hypothyroidism 05/24/2022   Ischemic cardiomyopathy    ejection fraction of 40-45%   Lethargy 03/28/2022   Lumbar spinal stenosis    Myocardial infarction (HCC)    "I've had 4" (10/16/2017)   Need for prophylactic vaccination and inoculation against influenza 03/28/2022   NSVT (nonsustained ventricular tachycardia) (HCC)    PAF (paroxysmal atrial fibrillation) (HCC) 02/09/2022   Paroxysmal atrial fibrillation (HCC)    Poorly controlled T2 diabetes mellitus (HCC) 09/27/2022   Protein-calorie malnutrition, severe 05/29/2022   PVC's (premature ventricular contractions)    PVD 09/22/2008   Qualifier: Diagnosis of  By: Vikki Ports     Rotator cuff arthropathy of right shoulder 09/27/2022   Status post coronary artery stent placement    TOBACCO ABUSE 05/29/2009   Qualifier: Diagnosis of  By: Meryl Crutch, RN, BSN, Doreatha Lew    Type 2 diabetes mellitus with other circulatory complications (HCC) 07/22/2022   Type II diabetes mellitus (HCC)    Unstable angina (HCC) 10/16/2017    Past Surgical History:  Procedure Laterality Date   AORTA - BILATERAL FEMORAL ARTERY BYPASS GRAFT Bilateral 1992   APPENDECTOMY     BACK SURGERY      COLECTOMY WITH COLOSTOMY CREATION/HARTMANN PROCEDURE N/A 05/26/2022   Procedure: COLECTOMY WITH COLOSTOMY CREATION/HARTMANN PROCEDURE;  Surgeon: Diamantina Monks, MD;  Location: [facility] OR;  Service: General;  Laterality: N/A;   CORONARY ANGIOPLASTY WITH STENT PLACEMENT     taxus stent  placed into his right coronary artery; stent placed to the LAD.     CORONARY STENT INTERVENTION N/A 10/16/2017   Procedure: CORONARY STENT INTERVENTION;  Surgeon: Kathleene Hazel, MD;  Location: [facility] INVASIVE CV LAB;  Service: Cardiovascular;  Laterality: N/A;   FEMORAL-FEMORAL BYPASS GRAFT  07/2009  LAPAROTOMY N/A 05/26/2022   Procedure: EXPLORATORY LAPAROTOMY;  Surgeon: Diamantina Monks, MD;  Location: [facility] OR;  Service: General;  Laterality: N/A;   LUMBAR MICRODISCECTOMY  12/31/08   LYSIS OF ADHESION N/A 06/30/2023   Procedure: LAPAROSCOPIC LYSIS OF ADHESIONS;  Surgeon: Karie Soda, MD;  Location: [facility] ORS;  Service: General;  Laterality: N/A;   PROCTOSCOPY N/A 06/30/2023   Procedure: Flexible sigmoidoscopy;  Surgeon: Karie Soda, MD;  Location: [facility] ORS;  Service: General;  Laterality: N/A;   RIGHT/LEFT HEART CATH AND CORONARY ANGIOGRAPHY N/A 10/16/2017   Procedure: RIGHT/LEFT HEART CATH AND CORONARY ANGIOGRAPHY;  Surgeon: Kathleene Hazel, MD;  Location: [facility] INVASIVE CV LAB;  Service: Cardiovascular;  Laterality: N/A;   SHOULDER OPEN ROTATOR CUFF REPAIR Left    XI ROBOTIC ASSISTED COLOSTOMY TAKEDOWN N/A 06/30/2023   Procedure: XI ROBOTIC ASSISTED OSTOMY TAKEDOWN, PARTIAL RECTOSIGMOID RESECTION, BILATERAL TAP BLOCK;  Surgeon: Karie Soda, MD;  Location: [facility] ORS;  Service: General;  Laterality: N/A;    Social History   Socioeconomic History   Marital status: Divorced    Spouse name: Not on file   Number of children: 1   Years of education: Not on file   Highest education level: Not on file  Occupational History   Occupation: retired    Associate Professor: RETIRED    Comment: truck driver  Tobacco Use    Smoking status: Former    Current packs/day: 0.00    Average packs/day: 0.5 packs/day for 56.0 years (28.0 ttl pk-yrs)    Types: Cigars, Cigarettes    Start date: 1966    Quit date: 2022    Years since quitting: 3.2   Smokeless tobacco: Former    Types: Engineer, drilling   Vaping status: Never Used  Substance and Sexual Activity   Alcohol use: Yes    Comment: Rarely 1 beer   Drug use: Never   Sexual activity: Not Currently  Other Topics Concern   Not on file  Social History Narrative   Lives with girlfriend in [place].     Social Drivers of Corporate investment banker Strain: Not on file  Food Insecurity: Patient Declined (06/30/2023)   Hunger Vital Sign    Worried About Running Out of Food in the Last Year: Patient declined    Ran Out of Food in the Last Year: Patient declined  Transportation Needs: Unknown (06/30/2023)   PRAPARE - Administrator, Civil Service (Medical): Patient declined    Lack of Transportation (Non-Medical): Patient unable to answer  Physical Activity: Not on file  Stress: Not on file  Social Connections: Patient Declined (06/30/2023)   Social Connection and Isolation Panel [NHANES]    Frequency of Communication with Friends and Family: Patient declined    Frequency of Social Gatherings with Friends and Family: Patient declined    Attends Religious Services: Patient declined    Database administrator or Organizations: Patient declined    Attends Banker Meetings: Patient declined    Marital Status: Patient declined  Intimate Partner Violence: Patient Declined (06/30/2023)   Humiliation, Afraid, Rape, and Kick questionnaire    Fear of Current or Ex-Partner: Patient declined    Emotionally Abused: Patient declined    Physically Abused: Patient declined    Sexually Abused: Patient declined    Family History  Problem Relation Age of Onset   Coronary artery disease Unknown    Diabetes Unknown    Cardiomyopathy Mother    Heart  disease  Mother    Hyperlipidemia Mother    Coronary artery disease Father    Stroke Father    Diabetes Father    Heart disease Father        before age 74   Hyperlipidemia Father    Heart attack Father    Diabetes Sister    Heart disease Sister        before age 61   Hyperlipidemia Sister    Heart attack Sister     Current Facility-Administered Medications  Medication Dose Route Frequency Provider Last Rate Last Admin   0.9 %  sodium chloride infusion  250 mL Intravenous PRN Karie Soda, MD       acetaminophen (TYLENOL) tablet 1,000 mg  1,000 mg Oral Trecia Rogers, MD   1,000 mg at 07/03/23 0056   alum & mag hydroxide-simeth (MAALOX/MYLANTA) 200-200-20 MG/5ML suspension 30 mL  30 mL Oral Q6H PRN Karie Soda, MD       alvimopan (ENTEREG) capsule 12 mg  12 mg Oral BID Karie Soda, MD   12 mg at 07/02/23 2134   aspirin EC tablet 81 mg  81 mg Oral Daily Karie Soda, MD   81 mg at 07/02/23 1117   carvedilol (COREG) tablet 12.5 mg  12.5 mg Oral BID WC Karie Soda, MD   12.5 mg at 07/03/23 0805   cyclobenzaprine (FLEXERIL) tablet 10 mg  10 mg Oral TID PRN Karie Soda, MD   10 mg at 07/01/23 0554   diphenhydrAMINE (BENADRYL) 12.5 MG/5ML elixir 12.5 mg  12.5 mg Oral Q6H PRN Karie Soda, MD       Or   diphenhydrAMINE (BENADRYL) injection 12.5 mg  12.5 mg Intravenous Q6H PRN Karie Soda, MD       empagliflozin (JARDIANCE) tablet 10 mg  10 mg Oral Daily Laban Orourke, Viviann Spare, MD       enoxaparin (LOVENOX) injection 40 mg  40 mg Subcutaneous Q24H Karie Soda, MD   40 mg at 07/02/23 1206   ezetimibe (ZETIA) tablet 10 mg  10 mg Oral Daily Karie Soda, MD   10 mg at 07/02/23 1116   feeding supplement (ENSURE SURGERY) liquid 237 mL  237 mL Oral BID BM Karie Soda, MD   237 mL at 07/02/23 1430   gabapentin (NEURONTIN) capsule 200 mg  200 mg Oral Laurena Slimmer, MD   200 mg at 07/02/23 2134   glipiZIDE (GLUCOTROL) tablet 10 mg  10 mg Oral BID Edwena Felty, MD   10 mg at  07/03/23 0804   hydrALAZINE (APRESOLINE) injection 10 mg  10 mg Intravenous Q2H PRN Karie Soda, MD       HYDROmorphone (DILAUDID) injection 0.5-2 mg  0.5-2 mg Intravenous Q4H PRN Karie Soda, MD   1 mg at 07/03/23 0734   insulin aspart (novoLOG) injection 0-15 Units  0-15 Units Subcutaneous TID WC Karie Soda, MD   3 Units at 07/03/23 0803   insulin aspart (novoLOG) injection 0-5 Units  0-5 Units Subcutaneous Laurena Slimmer, MD   4 Units at 07/02/23 2135   insulin glargine (LANTUS) injection 20 Units  20 Units Subcutaneous BID Karie Soda, MD   20 Units at 07/02/23 2137   loratadine (CLARITIN) tablet 10 mg  10 mg Oral Daily Karie Soda, MD   10 mg at 07/02/23 1117   magic mouthwash  15 mL Oral QID PRN Karie Soda, MD       melatonin tablet 3 mg  3 mg Oral QHS PRN Karie Soda, MD  menthol-cetylpyridinium (CEPACOL) lozenge 3 mg  1 lozenge Oral PRN Karie Soda, MD       metoprolol tartrate (LOPRESSOR) injection 5 mg  5 mg Intravenous Q6H PRN Karie Soda, MD       naphazoline-glycerin (CLEAR EYES REDNESS) ophth solution 1-2 drop  1-2 drop Both Eyes QID PRN Karie Soda, MD       nitroGLYCERIN (NITROSTAT) SL tablet 0.4 mg  0.4 mg Sublingual Q5 min PRN Karie Soda, MD       ondansetron [facility]) tablet 4 mg  4 mg Oral Q6H PRN Karie Soda, MD       Or   ondansetron [facility]) injection 4 mg  4 mg Intravenous Q6H PRN Karie Soda, MD   4 mg at 06/30/23 1803   phenol (CHLORASEPTIC) mouth spray 2 spray  2 spray Mouth/Throat PRN Karie Soda, MD       polycarbophil (FIBERCON) tablet 625 mg  625 mg Oral BID Karie Soda, MD   625 mg at 07/02/23 2134   prochlorperazine (COMPAZINE) tablet 10 mg  10 mg Oral Q6H PRN Karie Soda, MD       Or   prochlorperazine (COMPAZINE) injection 5-10 mg  5-10 mg Intravenous Q6H PRN Karie Soda, MD       rivaroxaban Carlena Hurl) tablet 20 mg  20 mg Oral Q supper Karie Soda, MD       rosuvastatin (CRESTOR) tablet 40 mg  40 mg Oral QPM Karie Soda, MD   40 mg at 07/02/23 1740   sacubitril-valsartan (ENTRESTO) 49-51 mg per tablet  1 tablet Oral BID Karie Soda, MD   1 tablet at 07/02/23 2134   simethicone (MYLICON) chewable tablet 40 mg  40 mg Oral Q6H PRN Karie Soda, MD   40 mg at 06/30/23 2150   sodium chloride (OCEAN) 0.65 % nasal spray 1-2 spray  1-2 spray Each Nare Q6H PRN Karie Soda, MD       sodium chloride flush (NS) 0.9 % injection 3 mL  3 mL Intravenous Catha Gosselin, MD   3 mL at 07/02/23 1122   sodium chloride flush (NS) 0.9 % injection 3 mL  3 mL Intravenous PRN Karie Soda, MD       tamsulosin (FLOMAX) capsule 0.8 mg  0.8 mg Oral QPM Karie Soda, MD   0.8 mg at 07/02/23 1740   traMADol (ULTRAM) tablet 50-100 mg  50-100 mg Oral Q6H PRN Karie Soda, MD   50 mg at 07/01/23 0901     Allergies  Allergen Reactions   Lisinopril Swelling and Rash    Rash - face and tounge swell    Signed:   [name], MD, FACS, MASCRS Esophageal, Gastrointestinal & Colorectal Surgery Robotic and Minimally Invasive Surgery  [facility] [address], [address] [place], [place] [address] [phone] Fax [phone] Main  CONTACT INFORMATION: Weekday (9AM-5PM): Call [facility] main office at [phone] Weeknight (5PM-9AM) or Weekend/Holiday: Check EPIC "Web Links" tab & use "AMION" (password " [email or web address]") for General Surgery [facility] coverage  Please, DO NOT use SecureChat  (it is not reliable communication to reach operating surgeons & will lead to a delay in care).   Epic staff messaging available for outptient concerns needing 1-2 business day response.      07/03/2023, 8:20 AM

## 2023-07-03 NOTE — Plan of Care (Signed)
  Problem: Education: Goal: Understanding of discharge needs will improve Outcome: Progressing Goal: Verbalization of understanding of the causes of altered bowel function will improve Outcome: Progressing

## 2023-07-09 ENCOUNTER — Encounter (HOSPITAL_COMMUNITY): Payer: Self-pay | Admitting: Emergency Medicine

## 2023-07-09 ENCOUNTER — Emergency Department (HOSPITAL_COMMUNITY)

## 2023-07-09 ENCOUNTER — Inpatient Hospital Stay (HOSPITAL_COMMUNITY)
Admission: EM | Admit: 2023-07-09 | Discharge: 2023-07-11 | DRG: 271 | Disposition: A | Discharge status: Home / Self Care | Attending: Internal Medicine | Admitting: Internal Medicine

## 2023-07-09 ENCOUNTER — Other Ambulatory Visit: Payer: Self-pay

## 2023-07-09 DIAGNOSIS — R9431 Abnormal electrocardiogram [ECG] [EKG]: Secondary | ICD-10-CM | POA: Diagnosis not present

## 2023-07-09 DIAGNOSIS — E7800 Pure hypercholesterolemia, unspecified: Secondary | ICD-10-CM | POA: Diagnosis present

## 2023-07-09 DIAGNOSIS — Z933 Colostomy status: Secondary | ICD-10-CM | POA: Diagnosis not present

## 2023-07-09 DIAGNOSIS — Z8249 Family history of ischemic heart disease and other diseases of the circulatory system: Secondary | ICD-10-CM | POA: Diagnosis not present

## 2023-07-09 DIAGNOSIS — I70221 Atherosclerosis of native arteries of extremities with rest pain, right leg: Principal | ICD-10-CM | POA: Diagnosis present

## 2023-07-09 DIAGNOSIS — I5042 Chronic combined systolic (congestive) and diastolic (congestive) heart failure: Secondary | ICD-10-CM | POA: Diagnosis present

## 2023-07-09 DIAGNOSIS — I480 Paroxysmal atrial fibrillation: Secondary | ICD-10-CM | POA: Diagnosis present

## 2023-07-09 DIAGNOSIS — E1122 Type 2 diabetes mellitus with diabetic chronic kidney disease: Secondary | ICD-10-CM | POA: Diagnosis present

## 2023-07-09 DIAGNOSIS — I743 Embolism and thrombosis of arteries of the lower extremities: Secondary | ICD-10-CM | POA: Diagnosis present

## 2023-07-09 DIAGNOSIS — Z7901 Long term (current) use of anticoagulants: Secondary | ICD-10-CM

## 2023-07-09 DIAGNOSIS — I10 Essential (primary) hypertension: Secondary | ICD-10-CM | POA: Diagnosis present

## 2023-07-09 DIAGNOSIS — Z79899 Other long term (current) drug therapy: Secondary | ICD-10-CM

## 2023-07-09 DIAGNOSIS — Z83438 Family history of other disorder of lipoprotein metabolism and other lipidemia: Secondary | ICD-10-CM

## 2023-07-09 DIAGNOSIS — I998 Other disorder of circulatory system: Secondary | ICD-10-CM | POA: Diagnosis present

## 2023-07-09 DIAGNOSIS — E1151 Type 2 diabetes mellitus with diabetic peripheral angiopathy without gangrene: Secondary | ICD-10-CM | POA: Diagnosis not present

## 2023-07-09 DIAGNOSIS — M79671 Pain in right foot: Secondary | ICD-10-CM | POA: Diagnosis not present

## 2023-07-09 DIAGNOSIS — Z888 Allergy status to other drugs, medicaments and biological substances status: Secondary | ICD-10-CM

## 2023-07-09 DIAGNOSIS — I2510 Atherosclerotic heart disease of native coronary artery without angina pectoris: Secondary | ICD-10-CM | POA: Diagnosis not present

## 2023-07-09 DIAGNOSIS — Z794 Long term (current) use of insulin: Secondary | ICD-10-CM

## 2023-07-09 DIAGNOSIS — E1159 Type 2 diabetes mellitus with other circulatory complications: Secondary | ICD-10-CM

## 2023-07-09 DIAGNOSIS — I5022 Chronic systolic (congestive) heart failure: Secondary | ICD-10-CM | POA: Diagnosis present

## 2023-07-09 DIAGNOSIS — I255 Ischemic cardiomyopathy: Secondary | ICD-10-CM | POA: Diagnosis present

## 2023-07-09 DIAGNOSIS — Z7982 Long term (current) use of aspirin: Secondary | ICD-10-CM

## 2023-07-09 DIAGNOSIS — Z7984 Long term (current) use of oral hypoglycemic drugs: Secondary | ICD-10-CM | POA: Diagnosis not present

## 2023-07-09 DIAGNOSIS — Z8551 Personal history of malignant neoplasm of bladder: Secondary | ICD-10-CM | POA: Diagnosis not present

## 2023-07-09 DIAGNOSIS — I493 Ventricular premature depolarization: Secondary | ICD-10-CM | POA: Diagnosis not present

## 2023-07-09 DIAGNOSIS — I739 Peripheral vascular disease, unspecified: Principal | ICD-10-CM | POA: Diagnosis present

## 2023-07-09 DIAGNOSIS — R200 Anesthesia of skin: Secondary | ICD-10-CM | POA: Diagnosis present

## 2023-07-09 DIAGNOSIS — E039 Hypothyroidism, unspecified: Secondary | ICD-10-CM | POA: Diagnosis not present

## 2023-07-09 DIAGNOSIS — E119 Type 2 diabetes mellitus without complications: Secondary | ICD-10-CM

## 2023-07-09 DIAGNOSIS — M79606 Pain in leg, unspecified: Secondary | ICD-10-CM | POA: Diagnosis not present

## 2023-07-09 DIAGNOSIS — N1832 Chronic kidney disease, stage 3b: Secondary | ICD-10-CM | POA: Diagnosis present

## 2023-07-09 DIAGNOSIS — I252 Old myocardial infarction: Secondary | ICD-10-CM

## 2023-07-09 DIAGNOSIS — I25110 Atherosclerotic heart disease of native coronary artery with unstable angina pectoris: Secondary | ICD-10-CM | POA: Diagnosis not present

## 2023-07-09 DIAGNOSIS — I130 Hypertensive heart and chronic kidney disease with heart failure and stage 1 through stage 4 chronic kidney disease, or unspecified chronic kidney disease: Secondary | ICD-10-CM | POA: Diagnosis present

## 2023-07-09 DIAGNOSIS — Z87891 Personal history of nicotine dependence: Secondary | ICD-10-CM | POA: Diagnosis not present

## 2023-07-09 DIAGNOSIS — Z955 Presence of coronary angioplasty implant and graft: Secondary | ICD-10-CM

## 2023-07-09 HISTORY — DX: Peripheral vascular disease, unspecified: I73.9

## 2023-07-09 LAB — CBC WITH DIFFERENTIAL/PLATELET
Abs Immature Granulocytes: 0.01 10*3/uL (ref 0.00–0.07)
Basophils Absolute: 0 10*3/uL (ref 0.0–0.1)
Basophils Relative: 0 %
Eosinophils Absolute: 0.1 10*3/uL (ref 0.0–0.5)
Eosinophils Relative: 2 %
HCT: 38.7 % — ABNORMAL LOW (ref 39.0–52.0)
Hemoglobin: 12.3 g/dL — ABNORMAL LOW (ref 13.0–17.0)
Immature Granulocytes: 0 %
Lymphocytes Relative: 23 %
Lymphs Abs: 1.8 10*3/uL (ref 0.7–4.0)
MCH: 27.7 pg (ref 26.0–34.0)
MCHC: 31.8 g/dL (ref 30.0–36.0)
MCV: 87.2 fL (ref 80.0–100.0)
Monocytes Absolute: 0.5 10*3/uL (ref 0.1–1.0)
Monocytes Relative: 7 %
Neutro Abs: 5.4 10*3/uL (ref 1.7–7.7)
Neutrophils Relative %: 68 %
Platelets: 332 10*3/uL (ref 150–400)
RBC: 4.44 MIL/uL (ref 4.22–5.81)
RDW: 15.2 % (ref 11.5–15.5)
WBC: 7.9 10*3/uL (ref 4.0–10.5)
nRBC: 0 % (ref 0.0–0.2)

## 2023-07-09 LAB — BASIC METABOLIC PANEL
Anion gap: 9 (ref 5–15)
BUN: 26 mg/dL — ABNORMAL HIGH (ref 8–23)
CO2: 23 mmol/L (ref 22–32)
Calcium: 9.1 mg/dL (ref 8.9–10.3)
Chloride: 106 mmol/L (ref 98–111)
Creatinine, Ser: 1.31 mg/dL — ABNORMAL HIGH (ref 0.61–1.24)
GFR, Estimated: 56 mL/min — ABNORMAL LOW (ref >60)
Glucose, Bld: 119 mg/dL — ABNORMAL HIGH (ref 70–99)
Potassium: 4.3 mmol/L (ref 3.5–5.1)
Sodium: 138 mmol/L (ref 135–145)

## 2023-07-09 LAB — VAS US ABI WITH/WO TBI
Left ABI: 0.96
Right ABI: 0.4

## 2023-07-09 LAB — PROTIME-INR
INR: 1.6 — ABNORMAL HIGH (ref 0.8–1.2)
Prothrombin Time: 18.9 s — ABNORMAL HIGH (ref 11.4–15.2)

## 2023-07-09 LAB — APTT: aPTT: 46 s — ABNORMAL HIGH (ref 24–36)

## 2023-07-09 LAB — GLUCOSE, CAPILLARY: Glucose-Capillary: 234 mg/dL — ABNORMAL HIGH (ref 70–99)

## 2023-07-09 MED ORDER — INSULIN ASPART 100 UNIT/ML IJ SOLN
0.0000 [IU] | Freq: Three times a day (TID) | INTRAMUSCULAR | Status: DC
  Administered 2023-07-09: 1 [IU] via SUBCUTANEOUS
  Administered 2023-07-10: 2 [IU] via SUBCUTANEOUS
  Administered 2023-07-11: 3 [IU] via SUBCUTANEOUS

## 2023-07-09 MED ORDER — ROSUVASTATIN CALCIUM 20 MG PO TABS
40.0000 mg | ORAL_TABLET | Freq: Every day | ORAL | Status: DC
  Administered 2023-07-09 – 2023-07-11 (×2): 40 mg via ORAL
  Filled 2023-07-09 (×2): qty 2

## 2023-07-09 MED ORDER — EMPAGLIFLOZIN 10 MG PO TABS
10.0000 mg | ORAL_TABLET | Freq: Every day | ORAL | Status: DC
  Administered 2023-07-11: 10 mg via ORAL
  Filled 2023-07-09 (×4): qty 1

## 2023-07-09 MED ORDER — TAMSULOSIN HCL 0.4 MG PO CAPS
0.8000 mg | ORAL_CAPSULE | Freq: Every evening | ORAL | Status: DC
  Administered 2023-07-09 – 2023-07-10 (×2): 0.8 mg via ORAL
  Filled 2023-07-09 (×2): qty 2

## 2023-07-09 MED ORDER — CYCLOBENZAPRINE HCL 10 MG PO TABS: 10.0000 mg | ORAL_TABLET | Freq: Three times a day (TID) | ORAL | Status: DC | PRN

## 2023-07-09 MED ORDER — HEPARIN BOLUS VIA INFUSION
2000.0000 [IU] | Freq: Once | INTRAVENOUS | Status: AC
  Administered 2023-07-09: 2000 [IU] via INTRAVENOUS
  Filled 2023-07-09: qty 2000

## 2023-07-09 MED ORDER — EZETIMIBE 10 MG PO TABS
10.0000 mg | ORAL_TABLET | Freq: Every day | ORAL | Status: DC
  Administered 2023-07-11: 10 mg via ORAL
  Filled 2023-07-09: qty 1

## 2023-07-09 MED ORDER — ONDANSETRON HCL 4 MG/2ML IJ SOLN: 4.0000 mg | Freq: Four times a day (QID) | INTRAMUSCULAR | Status: DC | PRN

## 2023-07-09 MED ORDER — ONDANSETRON HCL 4 MG PO TABS: 4.0000 mg | ORAL_TABLET | Freq: Four times a day (QID) | ORAL | Status: DC | PRN

## 2023-07-09 MED ORDER — METOPROLOL TARTRATE 5 MG/5ML IV SOLN: 2.5000 mg | Freq: Four times a day (QID) | INTRAVENOUS | Status: DC | PRN

## 2023-07-09 MED ORDER — LORATADINE 10 MG PO TABS
10.0000 mg | ORAL_TABLET | Freq: Every day | ORAL | Status: DC
  Administered 2023-07-09 – 2023-07-11 (×2): 10 mg via ORAL
  Filled 2023-07-09 (×2): qty 1

## 2023-07-09 MED ORDER — TRAMADOL HCL 50 MG PO TABS: 50.0000 mg | ORAL_TABLET | Freq: Four times a day (QID) | ORAL | Status: DC | PRN

## 2023-07-09 MED ORDER — ACETAMINOPHEN 650 MG RE SUPP: 650.0000 mg | Freq: Four times a day (QID) | RECTAL | Status: DC | PRN

## 2023-07-09 MED ORDER — ACETAMINOPHEN 325 MG PO TABS
650.0000 mg | ORAL_TABLET | Freq: Four times a day (QID) | ORAL | Status: DC | PRN
  Administered 2023-07-10 – 2023-07-11 (×3): 650 mg via ORAL
  Filled 2023-07-09 (×3): qty 2

## 2023-07-09 MED ORDER — ASPIRIN 81 MG PO TBEC
81.0000 mg | DELAYED_RELEASE_TABLET | Freq: Every day | ORAL | Status: DC
  Administered 2023-07-09 – 2023-07-11 (×2): 81 mg via ORAL
  Filled 2023-07-09 (×2): qty 1

## 2023-07-09 MED ORDER — HEPARIN (PORCINE) 25000 UT/250ML-% IV SOLN
1400.0000 [IU]/h | INTRAVENOUS | Status: DC
  Administered 2023-07-09: 1000 [IU]/h via INTRAVENOUS
  Filled 2023-07-09: qty 250

## 2023-07-09 MED ORDER — LOPERAMIDE HCL 2 MG PO CAPS
2.0000 mg | ORAL_CAPSULE | ORAL | Status: DC | PRN
  Administered 2023-07-09 – 2023-07-10 (×2): 2 mg via ORAL
  Filled 2023-07-09 (×2): qty 1

## 2023-07-09 MED ORDER — SACUBITRIL-VALSARTAN 49-51 MG PO TABS
1.0000 | ORAL_TABLET | Freq: Two times a day (BID) | ORAL | Status: DC
  Administered 2023-07-10 – 2023-07-11 (×2): 1 via ORAL
  Filled 2023-07-09 (×5): qty 1

## 2023-07-09 MED ORDER — AMLODIPINE BESYLATE 5 MG PO TABS
5.0000 mg | ORAL_TABLET | Freq: Every day | ORAL | Status: DC
  Administered 2023-07-09 – 2023-07-11 (×2): 5 mg via ORAL
  Filled 2023-07-09: qty 2
  Filled 2023-07-09: qty 1

## 2023-07-09 MED ORDER — CARVEDILOL 12.5 MG PO TABS
12.5000 mg | ORAL_TABLET | Freq: Two times a day (BID) | ORAL | Status: DC
  Administered 2023-07-10 – 2023-07-11 (×3): 12.5 mg via ORAL
  Filled 2023-07-09 (×3): qty 1

## 2023-07-09 NOTE — Progress Notes (Signed)
 PHARMACY - ANTICOAGULATION CONSULT NOTE  Pharmacy Consult for heparin  Indication:  limb ischemia  Allergies  Allergen Reactions   Lisinopril Swelling and Rash    Rash - face and tounge swell    Patient Measurements: Height: 5\' 7"  (170.2 cm) Weight: 81.6 kg (180 lb) IBW/kg (Calculated) : 66.1 Heparin Dosing Weight: 81.6kg   Vital Signs: Temp: 98.8 F (37.1 C) (03/23 1604) Temp Source: Oral (03/23 1604) BP: 133/84 (03/23 1152) Pulse Rate: 86 (03/23 1152)  Labs: Recent Labs    07/09/23 1432 07/09/23 1623  HGB 12.3*  --   HCT 38.7*  --   PLT 332  --   APTT  --  46*  LABPROT 18.9*  --   INR 1.6*  --   CREATININE 1.31*  --     Estimated Creatinine Clearance: 47.5 mL/min (A) (by C-G formula based on SCr of 1.31 mg/dL (H)).   Medical History: Past Medical History:  Diagnosis Date   Acute low back pain 01/12/2015   Acute pain of both shoulders 07/22/2022   AKI (acute kidney injury) (HCC) 05/24/2022   Arthritis    Atherosclerosis of native arteries of the extremities with intermittent claudication 08/13/2013   Backache 02/08/2013   Bladder cancer (HCC)    resection x3   Chronic combined systolic (congestive) and diastolic (congestive) heart failure (HCC) 07/22/2022   Chronic right shoulder pain 09/02/2022   Chronic systolic CHF (congestive heart failure) (HCC)    Cigarette smoker    Coronary artery disease    status post DMI RX Taxus stent RCA 2006 with susequent Stent LAD and subsequent  stent thrombosis RCA unable to be opened 2006 -neg mv 10/2008, 10/16/17 ISR to pLAD with PTCA/DES, CTO of RCA with collaterals, EF 25%   Coronary artery disease involving native coronary artery of native heart with unstable angina pectoris (HCC)    Current moderate episode of major depressive disorder without prior episode (HCC) 03/28/2022   Diverticulitis 06/10/2022   Diverticulitis large intestine 05/11/2021   Diverticulitis of large intestine with abscess 05/24/2022    Diverticulitis of sigmoid colon 05/11/2021   DM2 (diabetes mellitus, type 2) (HCC) 02/09/2022   status post bilateral aortobifemoral bypass [id number] with recent fem to fembypass April  2011 per DR. Early     Essential hypertension 07/22/2022   Hypercholesterolemia 10/28/2022   HYPERLIPIDEMIA-MIXED 09/22/2008   Qualifier: Diagnosis of  By: Vikki Ports     Hypothyroidism 05/24/2022   Ischemic cardiomyopathy    ejection fraction of 40-45%   Lethargy 03/28/2022   Lumbar spinal stenosis    Myocardial infarction (HCC)    "I've had 4" (10/16/2017)   Need for prophylactic vaccination and inoculation against influenza 03/28/2022   NSVT (nonsustained ventricular tachycardia) (HCC)    PAF (paroxysmal atrial fibrillation) (HCC) 02/09/2022   Paroxysmal atrial fibrillation (HCC)    Poorly controlled T2 diabetes mellitus (HCC) 09/27/2022   Protein-calorie malnutrition, severe 05/29/2022   PVC's (premature ventricular contractions)    PVD 09/22/2008   Qualifier: Diagnosis of  By: Vikki Ports     Rotator cuff arthropathy of right shoulder 09/27/2022   Status post coronary artery stent placement    TOBACCO ABUSE 05/29/2009   Qualifier: Diagnosis of  By: Meryl Crutch, RN, BSN, Doreatha Lew    Type 2 diabetes mellitus with other circulatory complications (HCC) 07/22/2022   Type II diabetes mellitus (HCC)    Unstable angina (HCC) 10/16/2017    Assessment: Patient presenting with limb ischemia, started on heparin 1000u/hr at OSH. Patient  on Xarelto PTA for pAF, last dose 3/22 PM per patient. Heparin infusing at 1000u/hr while in room. Last CBC 3/16 with stable HgB and PLTs. Pharmacy consulted to dose heparin gtt. Baseline aPTT elevated at 36.  aPTT subtherapeutic at 46. No pauses confirmed with nursing.   Goal of Therapy:  Heparin level 0.3-0.7 units/ml aPTT 66-102 Monitor platelets by anticoagulation protocol: Yes   Plan:  2000u bolus.  Will increase heparin to 1200u/hr.  8 hour aPTT, utilize aPTT  for checks now given recent DOAC intake. Anti-Xa will be falsely elevated.  Continue to monitor H&H and platelets  Estill Batten, PharmD, BCCCP  07/09/2023,5:19 PM

## 2023-07-09 NOTE — ED Notes (Addendum)
 Pharmacist at bedside.

## 2023-07-09 NOTE — Assessment & Plan Note (Addendum)
 [name]

## 2023-07-09 NOTE — ED Notes (Signed)
 PT meal tray delivered. Pt eating with family at bedside.

## 2023-07-09 NOTE — Assessment & Plan Note (Addendum)
 In NSR  Continue coreg Last had xarelto Saturday PM  Now on heparin gtt

## 2023-07-09 NOTE — ED Notes (Addendum)
 PT Aox4, GCS 15.   Pt sitting upright at this time eating meal.    Two family members at bedside.   Pt ambulated to bathroom with assistance.   Pt resting comfortably with no signs of distress.   Pt reports no pain.   Pt awaiting to be transported to inpatient room.

## 2023-07-09 NOTE — Plan of Care (Signed)

## 2023-07-09 NOTE — ED Notes (Signed)
 Pt still off unit to imaging.

## 2023-07-09 NOTE — Assessment & Plan Note (Signed)
 Remote history s/p treatment, can't recall treatment

## 2023-07-09 NOTE — ED Triage Notes (Addendum)
 TX from [place] pain and numbness to RLE, woke him at 5am. No dolp. pedal pulse positive for an occlusion

## 2023-07-09 NOTE — Progress Notes (Signed)
 [facility] night cross cover note:   The pt requests resumption of his outpatient prn Imodium in the setting of undergoing ostomy reversal on Friday, 07/07/2023.  I subsequently resumed his outpatient prn Imodium.   Dennis Pigg, DO Hospitalist

## 2023-07-09 NOTE — ED Notes (Signed)
 Pt being transported to ultrasound.

## 2023-07-09 NOTE — Assessment & Plan Note (Signed)
 Continue crestor

## 2023-07-09 NOTE — ED Provider Notes (Signed)
 Emergency Department Provider Note   I have reviewed the triage vital signs and the nursing notes.   HISTORY  Chief Complaint Claudication   HPI [name] is a 78 y.o. male with past history reviewed below including PAD presents to the emergency department with numbness to the right foot and lack of arterial pulses in that foot.  Patient arrives from [facility] emergency department where heparin was started.  Transfer was arranged to the ED. patient denies any pain in the foot.  His numbness has improved.  Denies significant claudication symptoms.  Notes that he did have arterial bypass surgery to his legs but that was approximately 20 years prior.  He states shortly after that surgery they had to go back in for what sounds like some additional stenting but has not needed to see a vascular surgeon since that time.   Past Medical History:  Diagnosis Date   Acute low back pain 01/12/2015   Acute pain of both shoulders 07/22/2022   AKI (acute kidney injury) (HCC) 05/24/2022   Arthritis    Atherosclerosis of native arteries of the extremities with intermittent claudication 08/13/2013   Backache 02/08/2013   Bladder cancer (HCC)    resection x3   Chronic combined systolic (congestive) and diastolic (congestive) heart failure (HCC) 07/22/2022   Chronic right shoulder pain 09/02/2022   Chronic systolic CHF (congestive heart failure) (HCC)    Cigarette smoker    Coronary artery disease    status post DMI RX Taxus stent RCA 2006 with susequent Stent LAD and subsequent  stent thrombosis RCA unable to be opened 2006 -neg mv 10/2008, 10/16/17 ISR to pLAD with PTCA/DES, CTO of RCA with collaterals, EF 25%   Coronary artery disease involving native coronary artery of native heart with unstable angina pectoris (HCC)    Current moderate episode of major depressive disorder without prior episode (HCC) 03/28/2022   Diverticulitis 06/10/2022   Diverticulitis large intestine 05/11/2021    Diverticulitis of large intestine with abscess 05/24/2022   Diverticulitis of sigmoid colon 05/11/2021   DM2 (diabetes mellitus, type 2) (HCC) 02/09/2022   status post bilateral aortobifemoral bypass [id number] with recent fem to fembypass April  2011 per DR. Early     Essential hypertension 07/22/2022   Hypercholesterolemia 10/28/2022   HYPERLIPIDEMIA-MIXED 09/22/2008   Qualifier: Diagnosis of  By: Vikki Ports     Hypothyroidism 05/24/2022   Ischemic cardiomyopathy    ejection fraction of 40-45%   Lethargy 03/28/2022   Lumbar spinal stenosis    Myocardial infarction (HCC)    "I've had 4" (10/16/2017)   Need for prophylactic vaccination and inoculation against influenza 03/28/2022   NSVT (nonsustained ventricular tachycardia) (HCC)    PAF (paroxysmal atrial fibrillation) (HCC) 02/09/2022   Paroxysmal atrial fibrillation (HCC)    Poorly controlled T2 diabetes mellitus (HCC) 09/27/2022   Protein-calorie malnutrition, severe 05/29/2022   PVC's (premature ventricular contractions)    PVD 09/22/2008   Qualifier: Diagnosis of  By: Vikki Ports     Rotator cuff arthropathy of right shoulder 09/27/2022   Status post coronary artery stent placement    TOBACCO ABUSE 05/29/2009   Qualifier: Diagnosis of  By: Meryl Crutch, RN, BSN, Doreatha Lew    Type 2 diabetes mellitus with other circulatory complications (HCC) 07/22/2022   Type II diabetes mellitus (HCC)    Unstable angina (HCC) 10/16/2017    Review of Systems  Constitutional: No fever/chills Cardiovascular: Denies chest pain. Respiratory: Denies shortness of breath. Gastrointestinal: No abdominal pain.  No  nausea, no vomiting.  Musculoskeletal: Negative for back pain. Skin: Negative for rash. Neurological: Negative for headaches. Positive right foot numbness (improved).   ____________________________________________   PHYSICAL EXAM:  VITAL SIGNS: ED Triage Vitals  Encounter Vitals Group     BP 07/09/23 1152 133/84     Pulse Rate  07/09/23 1152 86     Resp 07/09/23 1152 (!) 21     Temp 07/09/23 1152 98 F (36.7 C)     Weight 07/09/23 1154 180 lb (81.6 kg)     Height 07/09/23 1154 5\' 7"  (1.702 m)   Constitutional: Alert and oriented. Well appearing and in no acute distress. Eyes: Conjunctivae are normal.  Head: Atraumatic. Nose: No congestion/rhinnorhea. Mouth/Throat: Mucous membranes are moist.  Neck: No stridor.   Cardiovascular: Normal rate, regular rhythm.  Palpable DP pulse on the left with normal-appearing perfusion bilaterally.  Unable to palpate DP or PT pulse on the right foot. Both right and left feet appear reasonably well perfused.  Respiratory: Normal respiratory effort.  No retractions. Lungs CTAB. Gastrointestinal: No distention.  Musculoskeletal: No lower extremity tenderness nor edema. No gross deformities of extremities. Neurologic:  Normal speech and language. Normal strength in the bilateral LEs. Slightl decreased sensation in the right foot compared to left.  Skin:  Skin is warm, dry and intact. No rash noted.   ____________________________________________   LABS (all labs ordered are listed, but only abnormal results are displayed)  Labs Reviewed  BASIC METABOLIC PANEL - Abnormal; Notable for the following components:      Result Value   Glucose, Bld 119 (*)    BUN 26 (*)    Creatinine, Ser 1.31 (*)    GFR, Estimated 56 (*)    All other components within normal limits  CBC WITH DIFFERENTIAL/PLATELET - Abnormal; Notable for the following components:   Hemoglobin 12.3 (*)    HCT 38.7 (*)    All other components within normal limits  PROTIME-INR - Abnormal; Notable for the following components:   Prothrombin Time 18.9 (*)    INR 1.6 (*)    All other components within normal limits  APTT - Abnormal; Notable for the following components:   aPTT 46 (*)    All other components within normal limits  APTT - Abnormal; Notable for the following components:   aPTT 55 (*)    All other  components within normal limits  HEPARIN LEVEL (UNFRACTIONATED) - Abnormal; Notable for the following components:   Heparin Unfractionated >1.10 (*)    All other components within normal limits  CBC - Abnormal; Notable for the following components:   Hemoglobin 12.0 (*)    HCT 37.1 (*)    All other components within normal limits  BASIC METABOLIC PANEL - Abnormal; Notable for the following components:   CO2 21 (*)    Glucose, Bld 129 (*)    BUN 30 (*)    Creatinine, Ser 1.36 (*)    GFR, Estimated 53 (*)    All other components within normal limits  GLUCOSE, CAPILLARY - Abnormal; Notable for the following components:   Glucose-Capillary 234 (*)    All other components within normal limits  GLUCOSE, CAPILLARY - Abnormal; Notable for the following components:   Glucose-Capillary 135 (*)    All other components within normal limits  GLUCOSE, CAPILLARY - Abnormal; Notable for the following components:   Glucose-Capillary 150 (*)    All other components within normal limits  GLUCOSE, CAPILLARY - Abnormal; Notable for the following components:  Glucose-Capillary 135 (*)    All other components within normal limits  APTT - Abnormal; Notable for the following components:   aPTT 53 (*)    All other components within normal limits  GLUCOSE, CAPILLARY - Abnormal; Notable for the following components:   Glucose-Capillary 158 (*)    All other components within normal limits  CBC - Abnormal; Notable for the following components:   RBC 3.79 (*)    Hemoglobin 10.5 (*)    HCT 33.2 (*)    All other components within normal limits  GLUCOSE, CAPILLARY - Abnormal; Notable for the following components:   Glucose-Capillary 251 (*)    All other components within normal limits  GLUCOSE, CAPILLARY - Abnormal; Notable for the following components:   Glucose-Capillary 216 (*)    All other components within normal limits   ____________________________________________   PROCEDURES  Procedure(s)  performed:   Procedures  CRITICAL CARE Performed by: Maia Plan Total critical care time: 35 minutes Critical care time was exclusive of separately billable procedures and treating other patients. Critical care was necessary to treat or prevent imminent or life-threatening deterioration. Critical care was time spent personally by me on the following activities: development of treatment plan with patient and/or surrogate as well as nursing, discussions with consultants, evaluation of patient's response to treatment, examination of patient, obtaining history from patient or surrogate, ordering and performing treatments and interventions, ordering and review of laboratory studies, ordering and review of radiographic studies, pulse oximetry and re-evaluation of patient's condition.  Alona Bene, MD Emergency Medicine  ____________________________________________   INITIAL IMPRESSION / ASSESSMENT AND PLAN / ED COURSE  Pertinent labs & imaging results that were available during my care of the patient were reviewed by me and considered in my medical decision making (see chart for details).   This patient is Presenting for Evaluation of foot numbness, which does require a range of treatment options, and is a complaint that involves a moderate risk of morbidity and mortality.  The Differential Diagnoses include critical limb ischemia, claudication, peripheral neuropathy, CVA, etc.  Critical Interventions-    Medications  0.9% sodium chloride infusion (0 mL/kg/hr  81.6 kg Intravenous Stopped 07/10/23 2240)  heparin bolus via infusion 2,000 Units (2,000 Units Intravenous Bolus from Bag 07/09/23 1739)  0.9 %  sodium chloride infusion (250 mLs Intravenous New Bag/Given 07/10/23 1036)    Reassessment after intervention: No rest pain currently.   I decided to review pertinent External Data, and in summary no CT imaging or labs available from [facility].   Clinical Laboratory Tests Ordered,  included CBC without anemia. No AKI.   Radiologic Tests: ABI and Duplex ordered for Vascular Surgery consultation.    Social Determinants of Health Risk patient is not an active smoker.   Consult complete with Vascular surgery. Request ABI and Duplex. Continue heparin and admit to medicine service. Will take for angio tomorrow.   TRH. Plan for admit.   Medical Decision Making: Summary:  The patient presents emergency department right foot numbness.  I am unable to Doppler pulses in the right foot.  Contacted vascular surgery who gave recommendations over the phone with plan for angio tomorrow.  Plan to continue heparin, screening blood work, duplex studies, admit to medicine.  Patient's presentation is most consistent with acute presentation with potential threat to life or bodily function.   Disposition: discahrge  ____________________________________________  FINAL CLINICAL IMPRESSION(S) / ED DIAGNOSES  Final diagnoses:  Claudication [facility])    Note:  This document  was prepared using Conservation officer, historic buildings and may include unintentional dictation errors.  Alona Bene, MD, [facility] Emergency Medicine    Kimble Delaurentis, Arlyss Repress, MD 07/12/23 [phone]

## 2023-07-09 NOTE — Assessment & Plan Note (Signed)
 Continue entrestro and coreg

## 2023-07-09 NOTE — Assessment & Plan Note (Addendum)
 Euvolemic  Echo 05/2023: EF of 40-45% Mildly decreased LVF. Normal diastolic function  Continue GDMT with entresto, coreg.  Daily weights

## 2023-07-09 NOTE — Assessment & Plan Note (Signed)
 Creatinine baseline 1.3-1.4 stable

## 2023-07-09 NOTE — Assessment & Plan Note (Signed)
 Continue medical management

## 2023-07-09 NOTE — Progress Notes (Signed)
 VASCULAR LAB    Right lower extremity arterial duplex has been performed.  See CV proc for preliminary results.   Darian Ace, RVT 07/09/2023, 2:19 PM

## 2023-07-09 NOTE — Consult Note (Signed)
 ED consult    Reason for Consult:  RLE pain Referring Physician:  Dr. Jacqulyn Bath (ED) MRN #:  [id number]  History of Present Illness: This is a 78 y.o. male has extensive history of aortobifemoral bypass over 30 years ago requiring revision to a left to right femorofemoral bypass.  He now presents with 1 day history of right lower extremity pain and numbness.  He states that he took Xarelto yesterday morning as outpatient since.  He is now on heparin states that his numbness is resolving he does not have any motor dysfunction.  He has not had issues with his bypass in many years.  Past Medical History:  Diagnosis Date   Acute low back pain 01/12/2015   Acute pain of both shoulders 07/22/2022   AKI (acute kidney injury) (HCC) 05/24/2022   Arthritis    Atherosclerosis of native arteries of the extremities with intermittent claudication 08/13/2013   Backache 02/08/2013   Bladder cancer (HCC)    resection x3   Chronic combined systolic (congestive) and diastolic (congestive) heart failure (HCC) 07/22/2022   Chronic right shoulder pain 09/02/2022   Chronic systolic CHF (congestive heart failure) (HCC)    Cigarette smoker    Coronary artery disease    status post DMI RX Taxus stent RCA 2006 with susequent Stent LAD and subsequent  stent thrombosis RCA unable to be opened 2006 -neg mv 10/2008, 10/16/17 ISR to pLAD with PTCA/DES, CTO of RCA with collaterals, EF 25%   Coronary artery disease involving native coronary artery of native heart with unstable angina pectoris (HCC)    Current moderate episode of major depressive disorder without prior episode (HCC) 03/28/2022   Diverticulitis 06/10/2022   Diverticulitis large intestine 05/11/2021   Diverticulitis of large intestine with abscess 05/24/2022   Diverticulitis of sigmoid colon 05/11/2021   DM2 (diabetes mellitus, type 2) (HCC) 02/09/2022   status post bilateral aortobifemoral bypass [id number] with recent fem to fembypass April  2011 per DR. Early      Essential hypertension 07/22/2022   Hypercholesterolemia 10/28/2022   HYPERLIPIDEMIA-MIXED 09/22/2008   Qualifier: Diagnosis of  By: Vikki Ports     Hypothyroidism 05/24/2022   Ischemic cardiomyopathy    ejection fraction of 40-45%   Lethargy 03/28/2022   Lumbar spinal stenosis    Myocardial infarction (HCC)    "I've had 4" (10/16/2017)   Need for prophylactic vaccination and inoculation against influenza 03/28/2022   NSVT (nonsustained ventricular tachycardia) (HCC)    PAF (paroxysmal atrial fibrillation) (HCC) 02/09/2022   Paroxysmal atrial fibrillation (HCC)    Poorly controlled T2 diabetes mellitus (HCC) 09/27/2022   Protein-calorie malnutrition, severe 05/29/2022   PVC's (premature ventricular contractions)    PVD 09/22/2008   Qualifier: Diagnosis of  By: Vikki Ports     Rotator cuff arthropathy of right shoulder 09/27/2022   Status post coronary artery stent placement    TOBACCO ABUSE 05/29/2009   Qualifier: Diagnosis of  By: Meryl Crutch, RN, BSN, Doreatha Lew    Type 2 diabetes mellitus with other circulatory complications (HCC) 07/22/2022   Type II diabetes mellitus (HCC)    Unstable angina (HCC) 10/16/2017    Past Surgical History:  Procedure Laterality Date   AORTA - BILATERAL FEMORAL ARTERY BYPASS GRAFT Bilateral 1992   APPENDECTOMY     BACK SURGERY     COLECTOMY WITH COLOSTOMY CREATION/HARTMANN PROCEDURE N/A 05/26/2022   Procedure: COLECTOMY WITH COLOSTOMY CREATION/HARTMANN PROCEDURE;  Surgeon: Diamantina Monks, MD;  Location: [facility];  Service: General;  Laterality: N/A;  CORONARY ANGIOPLASTY WITH STENT PLACEMENT     taxus stent  placed into his right coronary artery; stent placed to the LAD.     CORONARY STENT INTERVENTION N/A 10/16/2017   Procedure: CORONARY STENT INTERVENTION;  Surgeon: Kathleene Hazel, MD;  Location: [facility];  Service: Cardiovascular;  Laterality: N/A;   FEMORAL-FEMORAL BYPASS GRAFT  07/2009   LAPAROTOMY N/A 05/26/2022    Procedure: EXPLORATORY LAPAROTOMY;  Surgeon: Diamantina Monks, MD;  Location: [facility];  Service: General;  Laterality: N/A;   LUMBAR MICRODISCECTOMY  12/31/08   LYSIS OF ADHESION N/A 06/30/2023   Procedure: LAPAROSCOPIC LYSIS OF ADHESIONS;  Surgeon: Karie Soda, MD;  Location: [facility];  Service: General;  Laterality: N/A;   PROCTOSCOPY N/A 06/30/2023   Procedure: Flexible sigmoidoscopy;  Surgeon: Karie Soda, MD;  Location: [facility];  Service: General;  Laterality: N/A;   RIGHT/LEFT HEART CATH AND CORONARY ANGIOGRAPHY N/A 10/16/2017   Procedure: RIGHT/LEFT HEART CATH AND CORONARY ANGIOGRAPHY;  Surgeon: Kathleene Hazel, MD;  Location: [facility];  Service: Cardiovascular;  Laterality: N/A;   SHOULDER OPEN ROTATOR CUFF REPAIR Left    XI ROBOTIC ASSISTED COLOSTOMY TAKEDOWN N/A 06/30/2023   Procedure: XI ROBOTIC ASSISTED OSTOMY TAKEDOWN, PARTIAL RECTOSIGMOID RESECTION, BILATERAL TAP BLOCK;  Surgeon: Karie Soda, MD;  Location: [facility];  Service: General;  Laterality: N/A;    Allergies  Allergen Reactions   Lisinopril Swelling and Rash    Rash - face and tounge swell    Prior to Admission medications   Medication Sig Start Date End Date Taking? Authorizing Provider  amLODipine (NORVASC) 5 MG tablet Take 1 tablet (5 mg total) by mouth daily. 09/21/22   Georgeanna Lea, MD  aspirin EC 81 MG tablet Take 1 tablet (81 mg total) by mouth daily. Swallow whole. 05/10/21   Kathleene Hazel, MD  carvedilol (COREG) 12.5 MG tablet Take 1 tablet (12.5 mg total) by mouth 2 (two) times daily with a meal. 01/24/23   Georgeanna Lea, MD  cetirizine (ZYRTEC) 10 MG tablet Take 1 tablet (10 mg total) by mouth daily. 02/09/23   Crist Fat, MD  cyclobenzaprine (FLEXERIL) 10 MG tablet Take 1 tablet (10 mg total) by mouth 3 (three) times daily as needed for muscle spasms. 05/24/23   Crist Fat, MD  ezetimibe (ZETIA) 10 MG tablet Take 1 tablet (10 mg total) by mouth daily. 03/01/23 02/24/24   Georgeanna Lea, MD  glipiZIDE (GLUCOTROL) 10 MG tablet TAKE 1 TABLET(10 MG) BY MOUTH TWICE DAILY 10/24/22   Leonia Reader, Barbara Cower, MD  insulin glargine (LANTUS) 100 unit/mL SOPN Start with 8 Units subcut daily.  Increase by 3 Units every 3 days until your FSBS are consistently less than 150 Patient taking differently: Inject 15-17 Units into the skin 2 (two) times daily. 06/06/23   Crist Fat, MD  JARDIANCE 10 MG TABS tablet Take 1 tablet (10 mg total) by mouth daily. 06/05/23   Crist Fat, MD  nitroGLYCERIN (NITROSTAT) 0.4 MG SL tablet Place 0.4 mg under the tongue every 5 (five) minutes as needed for chest pain.    [provider]  rivaroxaban (XARELTO) 20 MG TABS tablet Take 1 tablet (20 mg total) by mouth daily with supper. 12/23/22   Crist Fat, MD  rosuvastatin (CRESTOR) 40 MG tablet Take 1 tablet (40 mg total) by mouth daily. 11/22/22   Crist Fat, MD  sacubitril-valsartan (ENTRESTO) 49-51 MG Take 1 tablet by mouth  2 (two) times daily. 06/30/22   Gaston Islam., NP  tadalafil (CIALIS) 20 MG tablet Take 10 mg by mouth. 03/31/23   [provider]  tamsulosin (FLOMAX) 0.4 MG CAPS capsule Take 0.8 mg by mouth every evening. 03/31/23   [provider]  traMADol (ULTRAM) 50 MG tablet Take 1-2 tablets (50-100 mg total) by mouth every 6 (six) hours as needed for moderate pain (pain score 4-6). 07/03/23   Karie Soda, MD    Social History   Socioeconomic History   Marital status: Divorced    Spouse name: Not on file   Number of children: 1   Years of education: Not on file   Highest education level: Not on file  Occupational History   Occupation: retired    Associate Professor: RETIRED    Comment: truck driver  Tobacco Use   Smoking status: Former    Current packs/day: 0.00    Average packs/day: 0.5 packs/day for 56.0 years (28.0 ttl pk-yrs)    Types: Cigars, Cigarettes    Start date: 1966    Quit date: 2022    Years since quitting: 3.2   Smokeless tobacco:  Former    Types: Associate Professor status: Never Used  Substance and Sexual Activity   Alcohol use: Yes    Comment: Rarely 1 beer   Drug use: Never   Sexual activity: Not Currently  Other Topics Concern   Not on file  Social History Narrative   Lives with girlfriend in [place].     Social Drivers of Corporate investment banker Strain: Not on file  Food Insecurity: Patient Declined (06/30/2023)   Hunger Vital Sign    Worried About Running Out of Food in the Last Year: Patient declined    Ran Out of Food in the Last Year: Patient declined  Transportation Needs: Unknown (06/30/2023)   PRAPARE - Administrator, Civil Service (Medical): Patient declined    Lack of Transportation (Non-Medical): Patient unable to answer  Physical Activity: Not on file  Stress: Not on file  Social Connections: Patient Declined (06/30/2023)   Social Connection and Isolation Panel [NHANES]    Frequency of Communication with Friends and Family: Patient declined    Frequency of Social Gatherings with Friends and Family: Patient declined    Attends Religious Services: Patient declined    Database administrator or Organizations: Patient declined    Attends Banker Meetings: Patient declined    Marital Status: Patient declined  Intimate Partner Violence: Patient Declined (06/30/2023)   Humiliation, Afraid, Rape, and Kick questionnaire    Fear of Current or Ex-Partner: Patient declined    Emotionally Abused: Patient declined    Physically Abused: Patient declined    Sexually Abused: Patient declined     Family History  Problem Relation Age of Onset   Coronary artery disease Unknown    Diabetes Unknown    Cardiomyopathy Mother    Heart disease Mother    Hyperlipidemia Mother    Coronary artery disease Father    Stroke Father    Diabetes Father    Heart disease Father        before age 22   Hyperlipidemia Father    Heart attack Father    Diabetes Sister    Heart  disease Sister        before age 43   Hyperlipidemia Sister    Heart attack Sister     Review of  Systems  Constitutional: Negative.   HENT: Negative.    Respiratory: Negative.    Gastrointestinal:  Positive for abdominal pain.  Musculoskeletal:        Right leg pain  Skin: Negative.   Neurological:  Positive for sensory change.  Endo/Heme/Allergies: Negative.   Psychiatric/Behavioral: Negative.        Physical Examination  Vitals:   07/09/23 1152  BP: 133/84  Pulse: 86  Resp: (!) 21  Temp: 98 F (36.7 C)   Body mass index is 28.19 kg/m.  Physical Exam HENT:     Head: Normocephalic.     Nose: Nose normal.     Mouth/Throat:     Mouth: Mucous membranes are moist.  Cardiovascular:     Pulses:          Femoral pulses are 2+ on the right side and 2+ on the left side.      Dorsalis pedis pulses are detected w/ Doppler on the right side.       Posterior tibial pulses are detected w/ Doppler on the right side and 2+ on the left side.  Skin:    General: Skin is warm.     Capillary Refill: Capillary refill takes 2 to 3 seconds.  Neurological:     General: No focal deficit present.     Mental Status: He is alert and oriented to person, place, and time.      CBC    Component Value Date/Time   WBC 8.6 07/03/2023 0416   RBC 4.13 (L) 07/03/2023 0416   HGB 11.7 (L) 07/03/2023 0416   HGB 14.1 03/13/2023 1003   HCT 37.1 (L) 07/03/2023 0416   HCT 44.6 03/13/2023 1003   PLT 188 07/03/2023 0416   PLT 208 03/13/2023 1003   MCV 89.8 07/03/2023 0416   MCV 86 03/13/2023 1003   MCH 28.3 07/03/2023 0416   MCHC 31.5 07/03/2023 0416   RDW 14.9 07/03/2023 0416   RDW 13.9 03/13/2023 1003   LYMPHSABS 1.5 03/13/2023 1003   MONOABS 0.6 07/19/2010 1043   EOSABS 0.1 03/13/2023 1003   BASOSABS 0.0 03/13/2023 1003    BMET    Component Value Date/Time   NA 140 07/03/2023 0416   NA 140 03/13/2023 1003   K 4.4 07/03/2023 0416   CL 108 07/03/2023 0416   CO2 22 07/03/2023  0416   GLUCOSE 193 (H) 07/03/2023 0416   BUN 19 07/03/2023 0416   BUN 13 03/13/2023 1003   CREATININE 1.42 (H) 07/03/2023 0416   CREATININE 1.20 09/10/2013 0513   CALCIUM 8.9 07/03/2023 0416   GFRNONAA 51 (L) 07/03/2023 0416   GFRAA 82 02/09/2018 0721    COAGS: Lab Results  Component Value Date   INR 0.93 07/19/2010   INR 1.04 07/20/2009   INR 0.94 06/24/2009     Non-Invasive Vascular Imaging:   ABI Findings:  +---------+------------------+-----+-------------------+--------+  Right   Rt Pressure (mmHg)IndexWaveform           Comment   +---------+------------------+-----+-------------------+--------+  Brachial 136                    triphasic                    +---------+------------------+-----+-------------------+--------+  PTA     53                0.38 dampened monophasic          +---------+------------------+-----+-------------------+--------+  DP      56  0.40 dampened monophasic          +---------+------------------+-----+-------------------+--------+  Great Toe9                 0.06 Abnormal                     +---------+------------------+-----+-------------------+--------+   +---------+------------------+-----+-----------+-------+  Left    Lt Pressure (mmHg)IndexWaveform   Comment  +---------+------------------+-----+-----------+-------+  Brachial 140                    triphasic           +---------+------------------+-----+-----------+-------+  PTA     135               0.96 multiphasic         +---------+------------------+-----+-----------+-------+  DP      109               0.78 multiphasic         +---------+------------------+-----+-----------+-------+  Great Toe38                0.27 Abnormal            +---------+------------------+-----+-----------+-------+   +-------+-----------+-----------+------------+------------+  ABI/TBIToday's ABIToday's TBIPrevious ABIPrevious  TBI  +-------+-----------+-----------+------------+------------+  Right 0.40       0.06                                 +-------+-----------+-----------+------------+------------+  Left  0.96       0.27                                 +-------+-----------+-----------+------------+------------+         Summary:  Right: Resting right ankle-brachial index indicates severe right lower  extremity arterial disease. The right toe-brachial index is abnormal.   Left: Resting left ankle-brachial index is within normal range. The left  toe-brachial index is abnormal.    RIGHT     PSV cm/sRatioStenosisWaveform            Comments                 +----------+--------+-----+--------+--------------------+------------------  ----+  CFA Prox  23                   significantly       Partial thrombus  noted                                dampened flow       at CFA/graft             +----------+--------+-----+--------+--------------------+------------------  ----+  DFA      54                                                                +----------+--------+-----+--------+--------------------+------------------  ----+  SFA Prox  84                                                                +----------+--------+-----+--------+--------------------+------------------  ----+  SFA Mid   144                                      possible  anastomosis                                                       proximal to mid  SFA                                                        area                     +----------+--------+-----+--------+--------------------+------------------  ----+  SFA Distal             occluded                    reconstitution                                                              proximal  popliteal       +----------+--------+-----+--------+--------------------+------------------  ----+  POP Prox  11                   monophasic                                   +----------+--------+-----+--------+--------------------+------------------  ----+  POP Distal14                   monophasic                                   +----------+--------+-----+--------+--------------------+------------------  ----+  ATA Prox  7                    dampened monophasic                          +----------+--------+-----+--------+--------------------+------------------  ----+  ATA Mid                occluded                                             +----------+--------+-----+--------+--------------------+------------------  ----+  ATA Distal             occluded                                             +----------+--------+-----+--------+--------------------+------------------  ----+  PTA Prox  12  monophasic                                   +----------+--------+-----+--------+--------------------+------------------  ----+  PTA Mid   12                   monophasic                                   +----------+--------+-----+--------+--------------------+------------------  ----+  PTA Distal8                    monophasic                                   +----------+--------+-----+--------+--------------------+------------------  ----+      Summary:  Right: Probable partial thrombus noted at CFA/Graft with significantly  dampened flow. Proximal SFA is patent but occludes in the mid to distal  portion with reconstitution noted in the popliteal artery. Minimal flow  noted below the knee.     ASSESSMENT/PLAN: This is a 78 y.o. male presents with acute pain and numbness of his right lower extremity consistent with [place] 2A acute limb ischemia with underlying chronic lower extremity disease as evidenced by  previous CT scan.  His femorofemoral bypass is patent with strongly palpable common femoral pulses although evidence of thrombus in the right common femoral artery itself which is likely laminated and chronic.  Will begin with angiography, left side to right side approach and we discussed that he would possibly need open surgical intervention.  Continue heparin drip and will be n.p.o. past midnight.  Xarelto last taken yesterday and will continue to hold as well.  I discussed the risk benefits alternatives as well as possible need for surgery patient and his family bedside and he demonstrated understanding.   Sophiah Rolin C. Randie Heinz, MD [facility] Office: [phone] Pager: [phone]

## 2023-07-09 NOTE — Assessment & Plan Note (Addendum)
 78 year old male who had acute onset of right lower leg numbness and frequent leg cramping who was found to have probable partial thrombus noted at CFA/Graft with significantly dampened flow on lower extremity arterial duplex -admit to tele -continue heparin gtt and ASA. Last had xarelto on Saturday PM.  -vascular surgery consulted plan for angiography tomorrow  -NPO at midnight  -known PAD with  S/p bilateral aortobifemoral bypass [id number] with fem to fembypass April  2011 per DR. Early. Poor f/u with vascular clinic  -continue ASA/statin

## 2023-07-09 NOTE — H&P (Signed)
 History and Physical    Patient: [name] VQQ:[id number] DOB: 02-Jun-1945 DOA: 07/09/2023 DOS: the patient was seen and examined on 07/09/2023 PCP: Crist Fat, MD  Patient coming from: Home - lives alone. Uses walker    Chief Complaint: claudication   HPI: [name] is a 78 y.o. male with medical history significant of hx of bladder cancer, combined CHF, CAD, T2DM, HTN, HLD, PAF, who presented to ED with complaints of claudication. Was seen at [facility] and they did dopplers and couldn't get pulses. Started him on heparin and sent him to [facility] for further work up with vascular.   He states his right leg from his knee down went numb around 5AM when he woke up. He could walk, but it scared him. He called his friend who took him to the hospital. He also admits to having increased cramping in his legs. He is on xarelto and last took it Saturday PM.   Recently admitted by general surgery on 3/14-3/17 for diverticulitis large intestine s/p LAR/colostomy takedown 06/30/2023. He states he has had bad diarrhea, but this is improving. NO blood in the stool.    He has been feeling good. Denies any fever/chills, vision changes/headaches, chest pain or palpitations, shortness of breath or cough, abdominal pain, N/V, dysuria or leg swelling.    He does not smoke or drink alcohol.   ER Course:  vitals: afebrile, bp: 133/84, HR: 86, RR: 21. Oxygen: 93% on RA Pertinent labs: hgb: 12.3, BUN: 26, creatinine: 1.3,  ABI: severe right lower extremity arterial disease. Right toe-brachial index is abnormal. Left resting left ankle-brachial index is normal. Left toe-brachial index is abnormal.  In ED: vascular surgery consulted. Started on heparin. TRH asked to admit.   Review of Systems: As mentioned in the history of present illness. All other systems reviewed and are negative. Past Medical History:  Diagnosis Date   Acute low back pain 01/12/2015   Acute pain of both shoulders 07/22/2022   AKI  (acute kidney injury) (HCC) 05/24/2022   Arthritis    Atherosclerosis of native arteries of the extremities with intermittent claudication 08/13/2013   Backache 02/08/2013   Bladder cancer (HCC)    resection x3   Chronic combined systolic (congestive) and diastolic (congestive) heart failure (HCC) 07/22/2022   Chronic right shoulder pain 09/02/2022   Chronic systolic CHF (congestive heart failure) (HCC)    Cigarette smoker    Coronary artery disease    status post DMI RX Taxus stent RCA 2006 with susequent Stent LAD and subsequent  stent thrombosis RCA unable to be opened 2006 -neg mv 10/2008, 10/16/17 ISR to pLAD with PTCA/DES, CTO of RCA with collaterals, EF 25%   Coronary artery disease involving native coronary artery of native heart with unstable angina pectoris (HCC)    Current moderate episode of major depressive disorder without prior episode (HCC) 03/28/2022   Diverticulitis 06/10/2022   Diverticulitis large intestine 05/11/2021   Diverticulitis of large intestine with abscess 05/24/2022   Diverticulitis of sigmoid colon 05/11/2021   DM2 (diabetes mellitus, type 2) (HCC) 02/09/2022   status post bilateral aortobifemoral bypass [id number] with recent fem to fembypass April  2011 per DR. Early     Essential hypertension 07/22/2022   Hypercholesterolemia 10/28/2022   HYPERLIPIDEMIA-MIXED 09/22/2008   Qualifier: Diagnosis of  By: Vikki Ports     Hypothyroidism 05/24/2022   Ischemic cardiomyopathy    ejection fraction of 40-45%   Lethargy 03/28/2022   Lumbar spinal stenosis  Myocardial infarction (HCC)    "I've had 4" (10/16/2017)   Need for prophylactic vaccination and inoculation against influenza 03/28/2022   NSVT (nonsustained ventricular tachycardia) (HCC)    PAF (paroxysmal atrial fibrillation) (HCC) 02/09/2022   Paroxysmal atrial fibrillation (HCC)    Poorly controlled T2 diabetes mellitus (HCC) 09/27/2022   Protein-calorie malnutrition, severe 05/29/2022   PVC's  (premature ventricular contractions)    PVD 09/22/2008   Qualifier: Diagnosis of  By: Vikki Ports     Rotator cuff arthropathy of right shoulder 09/27/2022   Status post coronary artery stent placement    TOBACCO ABUSE 05/29/2009   Qualifier: Diagnosis of  By: Meryl Crutch, RN, BSN, Doreatha Lew    Type 2 diabetes mellitus with other circulatory complications (HCC) 07/22/2022   Type II diabetes mellitus (HCC)    Unstable angina (HCC) 10/16/2017   Past Surgical History:  Procedure Laterality Date   AORTA - BILATERAL FEMORAL ARTERY BYPASS GRAFT Bilateral 1992   APPENDECTOMY     BACK SURGERY     COLECTOMY WITH COLOSTOMY CREATION/HARTMANN PROCEDURE N/A 05/26/2022   Procedure: COLECTOMY WITH COLOSTOMY CREATION/HARTMANN PROCEDURE;  Surgeon: Diamantina Monks, MD;  Location: [facility] OR;  Service: General;  Laterality: N/A;   CORONARY ANGIOPLASTY WITH STENT PLACEMENT     taxus stent  placed into his right coronary artery; stent placed to the LAD.     CORONARY STENT INTERVENTION N/A 10/16/2017   Procedure: CORONARY STENT INTERVENTION;  Surgeon: Kathleene Hazel, MD;  Location: [facility] INVAS[facility];  Service: Cardiovascular;  Laterality: N/A;   FEMORAL-FEMORAL BYPASS GRAFT  07/2009   LAPAROTOMY N/A 05/26/2022   Procedure: EXPLORATORY LAPAROTOMY;  Surgeon: Diamantina Monks, MD;  Location: [facility] OR;  Service: General;  Laterality: N/A;   LUMBAR MICRODISCECTOMY  12/31/08   LYSIS OF ADHESION N/A 06/30/2023   Procedure: LAPAROSCOPIC LYSIS OF ADHESIONS;  Surgeon: Karie Soda, MD;  Location: [facility] ORS;  Service: General;  Laterality: N/A;   PROCTOSCOPY N/A 06/30/2023   Procedure: Flexible sigmoidoscopy;  Surgeon: Karie Soda, MD;  Location: [facility];  Service: General;  Laterality: N/A;   RIGHT/LEFT HEART CATH AND CORONARY ANGIOGRAPHY N/A 10/16/2017   Procedure: RIGHT/LEFT HEART CATH AND CORONARY ANGIOGRAPHY;  Surgeon: Kathleene Hazel, MD;  Location: [facility];  Service: Cardiovascular;  Laterality: N/A;    SHOULDER OPEN ROTATOR CUFF REPAIR Left    XI ROBOTIC ASSISTED COLOSTOMY TAKEDOWN N/A 06/30/2023   Procedure: XI ROBOTIC ASSISTED OSTOMY TAKEDOWN, PARTIAL RECTOSIGMOID RESECTION, BILATERAL TAP BLOCK;  Surgeon: Karie Soda, MD;  Location: [facility];  Service: General;  Laterality: N/A;   Social History:  reports that he quit smoking about 3 years ago. His smoking use included cigars and cigarettes. He started smoking about 59 years ago. He has a 28 pack-year smoking history. He has quit using smokeless tobacco.  His smokeless tobacco use included chew. He reports current alcohol use. He reports that he does not use drugs.  Allergies  Allergen Reactions   Lisinopril Swelling and Rash    Rash - face and tounge swell    Family History  Problem Relation Age of Onset   Coronary artery disease Unknown    Diabetes Unknown    Cardiomyopathy Mother    Heart disease Mother    Hyperlipidemia Mother    Coronary artery disease Father    Stroke Father    Diabetes Father    Heart disease Father        before age 12   Hyperlipidemia Father  Heart attack Father    Diabetes Sister    Heart disease Sister        before age 39   Hyperlipidemia Sister    Heart attack Sister     Prior to Admission medications   Medication Sig Start Date End Date Taking? Authorizing Provider  amLODipine (NORVASC) 5 MG tablet Take 1 tablet (5 mg total) by mouth daily. 09/21/22   Georgeanna Lea, MD  aspirin EC 81 MG tablet Take 1 tablet (81 mg total) by mouth daily. Swallow whole. 05/10/21   Kathleene Hazel, MD  carvedilol (COREG) 12.5 MG tablet Take 1 tablet (12.5 mg total) by mouth 2 (two) times daily with a meal. 01/24/23   Georgeanna Lea, MD  cetirizine (ZYRTEC) 10 MG tablet Take 1 tablet (10 mg total) by mouth daily. 02/09/23   Crist Fat, MD  cyclobenzaprine (FLEXERIL) 10 MG tablet Take 1 tablet (10 mg total) by mouth 3 (three) times daily as needed for muscle spasms. 05/24/23   Crist Fat,  MD  ezetimibe (ZETIA) 10 MG tablet Take 1 tablet (10 mg total) by mouth daily. 03/01/23 02/24/24  Georgeanna Lea, MD  glipiZIDE (GLUCOTROL) 10 MG tablet TAKE 1 TABLET(10 MG) BY MOUTH TWICE DAILY 10/24/22   Leonia Reader, Barbara Cower, MD  insulin glargine (LANTUS) 100 unit/mL SOPN Start with 8 Units subcut daily.  Increase by 3 Units every 3 days until your FSBS are consistently less than 150 Patient taking differently: Inject 15-17 Units into the skin 2 (two) times daily. 06/06/23   Crist Fat, MD  JARDIANCE 10 MG TABS tablet Take 1 tablet (10 mg total) by mouth daily. 06/05/23   Crist Fat, MD  nitroGLYCERIN (NITROSTAT) 0.4 MG SL tablet Place 0.4 mg under the tongue every 5 (five) minutes as needed for chest pain.    [provider]  rivaroxaban (XARELTO) 20 MG TABS tablet Take 1 tablet (20 mg total) by mouth daily with supper. 12/23/22   Crist Fat, MD  rosuvastatin (CRESTOR) 40 MG tablet Take 1 tablet (40 mg total) by mouth daily. 11/22/22   Crist Fat, MD  sacubitril-valsartan (ENTRESTO) 49-51 MG Take 1 tablet by mouth 2 (two) times daily. 06/30/22   Gaston Islam., NP  tadalafil (CIALIS) 20 MG tablet Take 10 mg by mouth. 03/31/23   [provider]  tamsulosin (FLOMAX) 0.4 MG CAPS capsule Take 0.8 mg by mouth every evening. 03/31/23   [provider]  traMADol (ULTRAM) 50 MG tablet Take 1-2 tablets (50-100 mg total) by mouth every 6 (six) hours as needed for moderate pain (pain score 4-6). 07/03/23   Karie Soda, MD    Physical Exam: Vitals:   07/09/23 1152 07/09/23 1154 07/09/23 1604  BP: 133/84    Pulse: 86    Resp: (!) 21    Temp: 98 F (36.7 C)  98.8 F (37.1 C)  TempSrc:   Oral  Weight:  81.6 kg   Height:  5\' 7"  (1.702 m)    General:  Appears calm and comfortable and is in NAD Eyes:  PERRL, EOMI, normal lids, iris ENT:  HOH, lips & tongue, mmm; appropriate dentition Neck:  no LAD, masses or thyromegaly; no carotid bruits Cardiovascular:  RRR, no  m/r/g. No LE edema.  Respiratory:   CTA bilaterally with no wheezes/rales/rhonchi.  Normal respiratory effort. Abdomen:  soft, mild TTP over mid/LLQ where incisions are. Incisions healing  Back:   normal alignment, no CVAT  Skin:  no rash or induration seen on limited exam Musculoskeletal:  grossly normal tone BUE/BLE, good ROM, no bony abnormality Lower extremity:  No LE edema.  Limited foot exam with no ulcerations. Right PT absent, very thready DP. Left pedal puses intact. Sensation intact. Foot warm to touch  Psychiatric:  grossly normal mood and affect, speech fluent and appropriate, AOx3 Neurologic:  CN 2-12 grossly intact, moves all extremities in coordinated fashion, sensation intact   Radiological Exams on Admission: Independently reviewed - see discussion in A/P where applicable  VAS [place] LOWER EXTREMITY ARTERIAL DUPLEX (ONLY [facility]) Result Date: 07/09/2023 LOWER EXTREMITY ARTERIAL DUPLEX STUDY Patient Name:  JAMOL GINYARD  Date of Exam:   07/09/2023 Medical Rec #: [id number]      Accession #:    [id number] Date of Birth: 06-27-1945      Patient Gender: M Patient Age:   34 years Exam [facility]  [facility] Procedure:      VAS [place] LOWER EXTREMITY ARTERIAL DUPLEX Referring Phys: JOSHUA LONG --------------------------------------------------------------------------------  Indications: Rest pain, and Cool painful foot that woke patient at 5am. Had CT              at [place], transferred to [facility]. Patient also endorses recent              bilateral hip claudication. High Risk Factors: Hypertension, hyperlipidemia, Diabetes, past history of                    smoking, prior MI, coronary artery disease. Other Factors: Status post Colostomy takedown 06/30/23, anticoagulants held                for procedure. Paroxysmal atrial fibrillation and history of                NSVT.                Ischemic cardiomyopathy. CKD III. History of bladder                cancer.  Vascular Interventions: Aortobifem  bypass circa 1992 per patient. Current ABI:            R:0.40/0.06 L:0.96/0.27 Limitations: No available OP notes or detailed surgical history of patient's              bypass or any follow-up studies. Comparison Study: No prior study at [facility] Performing Technologist: Sherren Kerns RVS  Examination Guidelines: A complete evaluation includes B-mode imaging, spectral Doppler, color Doppler, and power Doppler as needed of all accessible portions of each vessel. Bilateral testing is considered an integral part of a complete examination. Limited examinations for reoccurring indications may be performed as noted.  +----------+--------+-----+--------+--------------------+----------------------+ RIGHT     PSV cm/sRatioStenosisWaveform            Comments               +----------+--------+-----+--------+--------------------+----------------------+ CFA Prox  23                   significantly       Partial thrombus noted                                dampened flow       at CFA/graft           +----------+--------+-----+--------+--------------------+----------------------+ DFA       54                                                              +----------+--------+-----+--------+--------------------+----------------------+  SFA Prox  84                                                              +----------+--------+-----+--------+--------------------+----------------------+ SFA Mid   144                                      possible anastomosis                                                      proximal to mid SFA                                                       area                   +----------+--------+-----+--------+--------------------+----------------------+ SFA Distal             occluded                    reconstitution                                                            proximal popliteal      +----------+--------+-----+--------+--------------------+----------------------+ POP Prox  11                   monophasic                                 +----------+--------+-----+--------+--------------------+----------------------+ POP Distal14                   monophasic                                 +----------+--------+-----+--------+--------------------+----------------------+ ATA Prox  7                    dampened monophasic                        +----------+--------+-----+--------+--------------------+----------------------+ ATA Mid                occluded                                           +----------+--------+-----+--------+--------------------+----------------------+ ATA Distal             occluded                                           +----------+--------+-----+--------+--------------------+----------------------+  PTA Prox  12                   monophasic                                 +----------+--------+-----+--------+--------------------+----------------------+ PTA Mid   12                   monophasic                                 +----------+--------+-----+--------+--------------------+----------------------+ PTA Distal8                    monophasic                                 +----------+--------+-----+--------+--------------------+----------------------+  Summary: Right: Probable partial thrombus noted at CFA/Graft with significantly dampened flow. Proximal SFA is patent but occludes in the mid to distal portion with reconstitution noted in the popliteal artery. Minimal flow noted below the knee.  See table(s) above for measurements and observations.    Preliminary    VAS [place] ABI WITH/WO TBI Result Date: 07/09/2023  LOWER EXTREMITY DOPPLER STUDY Patient Name:  JULEZ HUSEBY  Date of Exam:   07/09/2023 Medical Rec #: [id number]      Accession #:    [id number] Date of Birth: [birth year]      Patient Gender: M Patient  Age:   36 years Exam Location:  [facility] Procedure:      VAS [place] ABI WITH/WO TBI Referring Phys: JOSHUA LONG --------------------------------------------------------------------------------  Indications: Rest pain, and peripheral artery disease. Cool painful foot that              woke patient from sleep at 5am. Transferred from [place]. Patient              also endorses recent history of bilateral hip claudication. High Risk Factors: Hypertension, hyperlipidemia, Diabetes, past history of                    smoking, coronary artery disease. Other Factors: Status post Colostomy takedown 06/30/23, anticoagulants held for                procedure. Paroxysmal atrial fibrillation and history of NSVT.                Ischemic cardiomyopathy. CKD III. History of bladder cancer.  Vascular Interventions: Aortobifem bypass circa 1992 per patient. Limitations: Today's exam was limited due to Irregular heartbeat. Comparison Study: No prior study on file Performing Technologist: Sherren Kerns RVS  Examination Guidelines: A complete evaluation includes at minimum, Doppler waveform signals and systolic blood pressure reading at the level of bilateral brachial, anterior tibial, and posterior tibial arteries, when vessel segments are accessible. Bilateral testing is considered an integral part of a complete examination. Photoelectric Plethysmograph (PPG) waveforms and toe systolic pressure readings are included as required and additional duplex testing as needed. Limited examinations for reoccurring indications may be performed as noted.  ABI Findings: +---------+------------------+-----+-------------------+--------+ Right    Rt Pressure (mmHg)IndexWaveform           Comment  +---------+------------------+-----+-------------------+--------+ Brachial 136                    triphasic                   +---------+------------------+-----+-------------------+--------+  PTA      53                0.38 dampened  monophasic         +---------+------------------+-----+-------------------+--------+ DP       56                0.40 dampened monophasic         +---------+------------------+-----+-------------------+--------+ Great Toe9                 0.06 Abnormal                    +---------+------------------+-----+-------------------+--------+ +---------+------------------+-----+-----------+-------+ Left     Lt Pressure (mmHg)IndexWaveform   Comment +---------+------------------+-----+-----------+-------+ Brachial 140                    triphasic          +---------+------------------+-----+-----------+-------+ PTA      135               0.96 multiphasic        +---------+------------------+-----+-----------+-------+ DP       109               0.78 multiphasic        +---------+------------------+-----+-----------+-------+ Great Toe38                0.27 Abnormal           +---------+------------------+-----+-----------+-------+ +-------+-----------+-----------+------------+------------+ ABI/TBIToday's ABIToday's TBIPrevious ABIPrevious TBI +-------+-----------+-----------+------------+------------+ Right  0.40       0.06                                +-------+-----------+-----------+------------+------------+ Left   0.96       0.27                                +-------+-----------+-----------+------------+------------+   Summary: Right: Resting right ankle-brachial index indicates severe right lower extremity arterial disease. The right toe-brachial index is abnormal. Left: Resting left ankle-brachial index is within normal range. The left toe-brachial index is abnormal. *See table(s) above for measurements and observations.     Preliminary     EKG: Independently reviewed.  NSR with rate 84; nonspecific ST changes with no evidence of acute ischemia. Junctional rhythm    Labs on Admission: I have personally reviewed the available labs and imaging studies  at the time of the admission.  Pertinent labs:   hgb: 12.3,  BUN: 26,  creatinine: 1.3  Assessment and Plan: Principal Problem:   Peripheral artery disease (HCC) Active Problems:   Chronic systolic CHF (congestive heart failure) (HCC)   DM2 (diabetes mellitus, type 2) (HCC)   Coronary artery disease involving native coronary artery of native heart with unstable angina pectoris (HCC)   Essential hypertension   CKD stage 3b, GFR 30-44 ml/min (HCC)   PAF (paroxysmal atrial fibrillation) (HCC)   Hypercholesterolemia   History of bladder cancer    Assessment and Plan: * Peripheral artery disease (HCC) 78 year old male who had acute onset of right lower leg numbness and frequent leg cramping who was found to have probable partial thrombus noted at CFA/Graft with significantly dampened flow on lower extremity arterial duplex -admit to tele -continue heparin gtt and ASA. Last had xarelto on Saturday PM.  -vascular surgery consulted plan for angiography tomorrow  -NPO at midnight  -known  PAD with  S/p bilateral aortobifemoral bypass in1992 with fem to fembypass April  2011 per DR. Early. Poor f/u with vascular clinic  -continue ASA/statin   Chronic systolic CHF (congestive heart failure) (HCC) Euvolemic  Echo 05/2023: EF of 40-45% Mildly decreased LVF. Normal diastolic function  Continue GDMT with entresto, coreg.  Daily weights   DM2 (diabetes mellitus, type 2) (HCC) Recent A1C 10.6, poorly controlled  SSI and accuchecks QAC/HS  Hold glipizide Continue with jardiance Hold lantus, BS stable and NPO at midnight, would resume tomorrow   Coronary artery disease involving native coronary artery of native heart with unstable angina pectoris (HCC) Continue medical management   Essential hypertension Continue entrestro and coreg   CKD stage 3b, GFR 30-44 ml/min (HCC) Creatinine baseline 1.3-1.4 stable  PAF (paroxysmal atrial fibrillation) (HCC) In NSR  Continue coreg Last had  xarelto Saturday PM  Now on heparin gtt   Hypercholesterolemia Continue crestor   History of bladder cancer Remote history s/p treatment, can't recall treatment     Advance Care Planning:   Code Status: Full Code    Consults: vascular: Dr. Randie Heinz   DVT Prophylaxis: heparin gtt   Family Communication: friend and girlfriend at bedside.   Severity of Illness: The appropriate patient status for this patient is INPATIENT. Inpatient status is judged to be reasonable and necessary in order to provide the required intensity of service to ensure the patient's safety. The patient's presenting symptoms, physical exam findings, and initial radiographic and laboratory data in the context of their chronic comorbidities is felt to place them at high risk for further clinical deterioration. Furthermore, it is not anticipated that the patient will be medically stable for discharge from the hospital within 2 midnights of admission.   * I certify that at the point of admission it is my clinical judgment that the patient will require inpatient hospital care spanning beyond 2 midnights from the point of admission due to high intensity of service, high risk for further deterioration and high frequency of surveillance required.*  Author: Orland Mustard, MD 07/09/2023 5:32 PM  For on call review [email or web address].

## 2023-07-09 NOTE — Progress Notes (Addendum)
 PHARMACY - ANTICOAGULATION CONSULT NOTE  Pharmacy Consult for heparin  Indication:  limb ischemia  Allergies  Allergen Reactions   Lisinopril Swelling and Rash    Rash - face and tounge swell    Patient Measurements: Height: 5\' 7"  (170.2 cm) Weight: 81.6 kg (180 lb) IBW/kg (Calculated) : 66.1 Heparin Dosing Weight: 81.6kg   Vital Signs: Temp: 98 F (36.7 C) (03/23 1152) BP: 133/84 (03/23 1152) Pulse Rate: 86 (03/23 1152)  Labs: No results for input(s): "HGB", "HCT", "PLT", "APTT", "LABPROT", "INR", "HEPARINUNFRC", "HEPRLOWMOCWT", "CREATININE", "CKTOTAL", "CKMB", "TROPONINIHS" in the last 72 hours.  Estimated Creatinine Clearance: 43.8 mL/min (A) (by C-G formula based on SCr of 1.42 mg/dL (H)).   Medical History: Past Medical History:  Diagnosis Date   Acute low back pain 01/12/2015   Acute pain of both shoulders 07/22/2022   AKI (acute kidney injury) (HCC) 05/24/2022   Arthritis    Atherosclerosis of native arteries of the extremities with intermittent claudication 08/13/2013   Backache 02/08/2013   Bladder cancer (HCC)    resection x3   Chronic combined systolic (congestive) and diastolic (congestive) heart failure (HCC) 07/22/2022   Chronic right shoulder pain 09/02/2022   Chronic systolic CHF (congestive heart failure) (HCC)    Cigarette smoker    Coronary artery disease    status post DMI RX Taxus stent RCA 2006 with susequent Stent LAD and subsequent  stent thrombosis RCA unable to be opened 2006 -neg mv 10/2008, 10/16/17 ISR to pLAD with PTCA/DES, CTO of RCA with collaterals, EF 25%   Coronary artery disease involving native coronary artery of native heart with unstable angina pectoris (HCC)    Current moderate episode of major depressive disorder without prior episode (HCC) 03/28/2022   Diverticulitis 06/10/2022   Diverticulitis large intestine 05/11/2021   Diverticulitis of large intestine with abscess 05/24/2022   Diverticulitis of sigmoid colon 05/11/2021    DM2 (diabetes mellitus, type 2) (HCC) 02/09/2022   status post bilateral aortobifemoral bypass [id number] with recent fem to fembypass April  2011 per DR. Early     Essential hypertension 07/22/2022   Hypercholesterolemia 10/28/2022   HYPERLIPIDEMIA-MIXED 09/22/2008   Qualifier: Diagnosis of  By: Vikki Ports     Hypothyroidism 05/24/2022   Ischemic cardiomyopathy    ejection fraction of 40-45%   Lethargy 03/28/2022   Lumbar spinal stenosis    Myocardial infarction (HCC)    "I've had 4" (10/16/2017)   Need for prophylactic vaccination and inoculation against influenza 03/28/2022   NSVT (nonsustained ventricular tachycardia) (HCC)    PAF (paroxysmal atrial fibrillation) (HCC) 02/09/2022   Paroxysmal atrial fibrillation (HCC)    Poorly controlled T2 diabetes mellitus (HCC) 09/27/2022   Protein-calorie malnutrition, severe 05/29/2022   PVC's (premature ventricular contractions)    PVD 09/22/2008   Qualifier: Diagnosis of  By: Vikki Ports     Rotator cuff arthropathy of right shoulder 09/27/2022   Status post coronary artery stent placement    TOBACCO ABUSE 05/29/2009   Qualifier: Diagnosis of  By: Meryl Crutch, RN, BSN, Doreatha Lew    Type 2 diabetes mellitus with other circulatory complications (HCC) 07/22/2022   Type II diabetes mellitus (HCC)    Unstable angina (HCC) 10/16/2017    Assessment: Patient presenting with limb ischemia, started on heparin 1000u/hr at OSH. Patient on Xarelto PTA for pAF, last dose 3/22 PM per patient. Heparin infusing at 1000u/hr while in room. Last CBC 3/16 with stable HgB and PLTs. Pharmacy consulted to dose heparin gtt. Baseline aPTT elevated at 36.  Goal of Therapy:  Heparin level 0.3-0.7 units/ml aPTT 66-102 Monitor platelets by anticoagulation protocol: Yes   Plan:  Heparin 1000u/hr started at 22:30 per OSH. No other levels besides baseline aPTT.  Will get STAT aPTT, utilize aPTT for checks now given recent DOAC intake. Anti-Xa will be falsely  elevated.  Continue to monitor H&H and platelets  Estill Batten, PharmD, BCCCP  07/09/2023,12:48 PM

## 2023-07-09 NOTE — Assessment & Plan Note (Addendum)
 Recent A1C 10.6, poorly controlled  SSI and accuchecks QAC/HS  Hold glipizide Continue with jardiance Hold lantus, BS stable and NPO at midnight, would resume tomorrow

## 2023-07-09 NOTE — Progress Notes (Signed)
 VASCULAR LAB    ABI has been performed.  See CV proc for preliminary results.   Santiaga Butzin, RVT 07/09/2023, 2:19 PM

## 2023-07-10 ENCOUNTER — Encounter (HOSPITAL_COMMUNITY)
Admission: EM | Disposition: A | Discharge status: Home / Self Care | Payer: Self-pay | Source: Home / Self Care | Attending: Internal Medicine

## 2023-07-10 ENCOUNTER — Encounter (HOSPITAL_COMMUNITY): Payer: Self-pay | Admitting: Vascular Surgery

## 2023-07-10 DIAGNOSIS — I739 Peripheral vascular disease, unspecified: Secondary | ICD-10-CM | POA: Diagnosis not present

## 2023-07-10 DIAGNOSIS — I998 Other disorder of circulatory system: Secondary | ICD-10-CM | POA: Diagnosis not present

## 2023-07-10 DIAGNOSIS — M79671 Pain in right foot: Secondary | ICD-10-CM

## 2023-07-10 HISTORY — PX: LOWER EXTREMITY INTERVENTION: CATH118252

## 2023-07-10 HISTORY — PX: ABDOMINAL AORTOGRAM W/LOWER EXTREMITY: CATH118223

## 2023-07-10 HISTORY — PX: PERIPHERAL VASCULAR THROMBECTOMY: CATH118306

## 2023-07-10 LAB — BASIC METABOLIC PANEL
Anion gap: 10 (ref 5–15)
BUN: 30 mg/dL — ABNORMAL HIGH (ref 8–23)
CO2: 21 mmol/L — ABNORMAL LOW (ref 22–32)
Calcium: 9 mg/dL (ref 8.9–10.3)
Chloride: 108 mmol/L (ref 98–111)
Creatinine, Ser: 1.36 mg/dL — ABNORMAL HIGH (ref 0.61–1.24)
GFR, Estimated: 53 mL/min — ABNORMAL LOW (ref >60)
Glucose, Bld: 129 mg/dL — ABNORMAL HIGH (ref 70–99)
Potassium: 4.1 mmol/L (ref 3.5–5.1)
Sodium: 139 mmol/L (ref 135–145)

## 2023-07-10 LAB — GLUCOSE, CAPILLARY
Glucose-Capillary: 135 mg/dL — ABNORMAL HIGH (ref 70–99)
Glucose-Capillary: 150 mg/dL — ABNORMAL HIGH (ref 70–99)
Glucose-Capillary: 135 mg/dL — ABNORMAL HIGH (ref 70–99)
Glucose-Capillary: 158 mg/dL — ABNORMAL HIGH (ref 70–99)
Glucose-Capillary: 251 mg/dL — ABNORMAL HIGH (ref 70–99)

## 2023-07-10 LAB — CBC
HCT: 37.1 % — ABNORMAL LOW (ref 39.0–52.0)
Hemoglobin: 12 g/dL — ABNORMAL LOW (ref 13.0–17.0)
MCH: 28.3 pg (ref 26.0–34.0)
MCHC: 32.3 g/dL (ref 30.0–36.0)
MCV: 87.5 fL (ref 80.0–100.0)
Platelets: 305 10*3/uL (ref 150–400)
RBC: 4.24 MIL/uL (ref 4.22–5.81)
RDW: 15 % (ref 11.5–15.5)
WBC: 7.5 10*3/uL (ref 4.0–10.5)
nRBC: 0 % (ref 0.0–0.2)

## 2023-07-10 LAB — HEPARIN LEVEL (UNFRACTIONATED): Heparin Unfractionated: >1.1 [IU]/mL — ABNORMAL HIGH (ref 0.30–0.70)

## 2023-07-10 LAB — APTT
aPTT: 55 s — ABNORMAL HIGH (ref 24–36)
aPTT: 53 s — ABNORMAL HIGH (ref 24–36)

## 2023-07-10 MED ORDER — SODIUM CHLORIDE 0.9 % IV SOLN: INTRAVENOUS | Status: DC

## 2023-07-10 MED ORDER — ACETAMINOPHEN 325 MG PO TABS: 650.0000 mg | ORAL_TABLET | ORAL | Status: DC | PRN

## 2023-07-10 MED ORDER — MIDAZOLAM HCL 2 MG/2ML IJ SOLN
INTRAMUSCULAR | Status: DC | PRN
  Administered 2023-07-10 (×2): 1 mg via INTRAVENOUS

## 2023-07-10 MED ORDER — SODIUM CHLORIDE 0.9% FLUSH: 3.0000 mL | INTRAVENOUS | Status: DC | PRN

## 2023-07-10 MED ORDER — FENTANYL CITRATE (PF) 100 MCG/2ML IJ SOLN
INTRAMUSCULAR | Status: DC | PRN
  Administered 2023-07-10 (×2): 50 ug via INTRAVENOUS

## 2023-07-10 MED ORDER — IODIXANOL 320 MG/ML IV SOLN
INTRAVENOUS | Status: DC | PRN
Start: 2023-07-10 — End: 2023-07-10
  Administered 2023-07-10: 75 mL

## 2023-07-10 MED ORDER — SODIUM CHLORIDE 0.9% FLUSH
3.0000 mL | Freq: Two times a day (BID) | INTRAVENOUS | Status: DC
  Administered 2023-07-11: 3 mL via INTRAVENOUS

## 2023-07-10 MED ORDER — INSULIN GLARGINE-YFGN 100 UNIT/ML ~~LOC~~ SOLN
10.0000 [IU] | Freq: Every day | SUBCUTANEOUS | Status: DC
  Administered 2023-07-10: 10 [IU] via SUBCUTANEOUS
  Filled 2023-07-10 (×2): qty 0.1

## 2023-07-10 MED ORDER — HEPARIN SODIUM (PORCINE) 1000 UNIT/ML IJ SOLN
INTRAMUSCULAR | Status: AC
Start: 2023-07-10 — End: ?
  Filled 2023-07-10: qty 10

## 2023-07-10 MED ORDER — MIDAZOLAM HCL 2 MG/2ML IJ SOLN
INTRAMUSCULAR | Status: AC
Start: 2023-07-10 — End: ?
  Filled 2023-07-10: qty 2

## 2023-07-10 MED ORDER — LIDOCAINE HCL (PF) 1 % IJ SOLN
INTRAMUSCULAR | Status: DC | PRN
  Administered 2023-07-10: 15 mL

## 2023-07-10 MED ORDER — INSULIN GLARGINE 100 UNITS/ML SOLOSTAR PEN
10.0000 [IU] | PEN_INJECTOR | Freq: Every day | SUBCUTANEOUS | Status: DC
Start: 2023-07-10 — End: 2023-07-10

## 2023-07-10 MED ORDER — SODIUM CHLORIDE 0.9 % IV SOLN: 250.0000 mL | INTRAVENOUS | Status: DC | PRN

## 2023-07-10 MED ORDER — SODIUM CHLORIDE 0.9 % IV SOLN
INTRAVENOUS | Status: AC | PRN
  Administered 2023-07-10: 250 mL via INTRAVENOUS

## 2023-07-10 MED ORDER — SODIUM CHLORIDE 0.9 % WEIGHT BASED INFUSION: 1.0000 mL/kg/h | INTRAVENOUS | Status: AC

## 2023-07-10 MED ORDER — LOPERAMIDE HCL 2 MG PO CAPS
2.0000 mg | ORAL_CAPSULE | Freq: Two times a day (BID) | ORAL | Status: DC
Start: 2023-07-10 — End: 2023-07-11
  Administered 2023-07-10 – 2023-07-11 (×2): 2 mg via ORAL
  Filled 2023-07-10 (×2): qty 1

## 2023-07-10 MED ORDER — FENTANYL CITRATE (PF) 100 MCG/2ML IJ SOLN
INTRAMUSCULAR | Status: AC
  Filled 2023-07-10: qty 2

## 2023-07-10 MED ORDER — HEPARIN SODIUM (PORCINE) 1000 UNIT/ML IJ SOLN
INTRAMUSCULAR | Status: DC | PRN
  Administered 2023-07-10: 7000 [IU] via INTRAVENOUS

## 2023-07-10 MED ORDER — HEPARIN (PORCINE) IN NACL 1000-0.9 UT/500ML-% IV SOLN
INTRAVENOUS | Status: DC | PRN
  Administered 2023-07-10 (×2): 500 mL

## 2023-07-10 MED ORDER — HYDRALAZINE HCL 20 MG/ML IJ SOLN: 5.0000 mg | INTRAMUSCULAR | Status: DC | PRN

## 2023-07-10 MED ORDER — HEPARIN (PORCINE) 25000 UT/250ML-% IV SOLN
1600.0000 [IU]/h | INTRAVENOUS | Status: DC
  Administered 2023-07-10: 1400 [IU]/h via INTRAVENOUS
  Filled 2023-07-10 (×2): qty 250

## 2023-07-10 MED ORDER — LIDOCAINE HCL (PF) 1 % IJ SOLN
INTRAMUSCULAR | Status: AC
  Filled 2023-07-10: qty 30

## 2023-07-10 NOTE — Progress Notes (Signed)
 PHARMACY - ANTICOAGULATION  Pharmacy Consult for heparin  Indication:  limb ischemia Brief A/P: aPTT subtherapeutic  Increase Heparin rate  Allergies  Allergen Reactions   Lisinopril Swelling and Rash    Rash - face and tounge swell    Patient Measurements: Height: 5\' 7"  (170.2 cm) Weight: 81.6 kg (180 lb) IBW/kg (Calculated) : 66.1 Heparin Dosing Weight: 81.6kg   Vital Signs: Temp: 98.6 F (37 C) (03/23 2156) Temp Source: Oral (03/23 2156) BP: 119/65 (03/23 2156) Pulse Rate: 92 (03/23 2156)  Labs: Recent Labs    07/09/23 1432 07/09/23 1623 07/10/23 0208  HGB 12.3*  --  12.0*  HCT 38.7*  --  37.1*  PLT 332  --  305  APTT  --  46* 55*  LABPROT 18.9*  --   --   INR 1.6*  --   --   HEPARINUNFRC  --   --  >1.10*  CREATININE 1.31*  --  1.36*    Estimated Creatinine Clearance: 45.8 mL/min (A) (by C-G formula based on SCr of 1.36 mg/dL (H)).  Assessment: 78 y.o. male admitted with RLE ischemia, h/o Afib and Xarelto on hold, for heparin  Goal of Therapy:  Heparin level 0.3-0.7 units/ml aPTT 66-102 Monitor platelets by anticoagulation protocol: Yes   Plan:  Increase Heparin 1400 units/hr Check aPTT in 8 hours   Geannie Risen, PharmD, BCPS  07/10/2023,3:12 AM

## 2023-07-10 NOTE — Progress Notes (Addendum)
  Progress Note    07/10/2023 7:50 AM * No surgery found *  Right leg remains painful and numb CLI with rest pain of RLE Scheduled for Aortogram, Arteriogram of BLE in cath lab with Dr. Hetty Blend today Reviewed procedure with patient and his girlfriend and answered their questions Keep NPO Consent ordered  Graceann Congress, PA-C [facility] [phone] 07/10/2023 7:50 AM  VASCULAR STAFF ADDENDUM: I have independently interviewed and examined the patient. I agree with the above.   Daria Pastures MD [facility] Phone Number: [phone] 07/10/2023 9:47 AM

## 2023-07-10 NOTE — Progress Notes (Signed)
 Progress Note   Patient: ALEXANDRA POSADAS UJW:[id number] DOB: 1945/09/23 DOA: 07/09/2023     1 DOS: the patient was seen and examined on 07/10/2023   Brief hospital course:   DEYONTE CADDEN is a 78 y.o. male with medical history significant of hx of bladder cancer, combined CHF, CAD, T2DM, HTN, HLD, PAF, who presented to ED with complaints of claudication. Was seen at [facility] and they did dopplers and couldn't get pulses. Started him on heparin and sent him to [facility] for further work up with vascular.    He states his right leg from his knee down went numb around 5AM when he woke up. He could walk, but it scared him. He called his friend who took him to the hospital. He also admits to having increased cramping in his legs. He is on xarelto and last took it Saturday PM.    Recently admitted by general surgery on 3/14-3/17 for diverticulitis large intestine s/p LAR/colostomy takedown 06/30/2023. He states he has had bad diarrhea, but this is improving. NO blood in the stool.      He has been feeling good. Denies any fever/chills, vision changes/headaches, chest pain or palpitations, shortness of breath or cough, abdominal pain, N/V, dysuria or leg swelling.      He does not smoke or drink alcohol.     Assessment and Plan:  Acute thrombosis of the mid SFA Chronic occlusion of the right profunda Patient is status post mechanical thrombectomy of right SFA and mechanical thrombectomy of right AT. Status post balloon angioplasty of the right SFA Continue heparin drip Continue aspirin, statin and carvedilol    Chronic systolic CHF Patient appears euvolemic Last known LVEF of 40 to 45% from a 2D echocardiogram done 02/25 Continue GDMT with carvedilol and Entresto   Diabetes mellitus with complications of stage IIIb chronic kidney disease Recent A1c of 10.6 Hold oral hypoglycemic agents Continue Lantus and Premeal insulin    Coronary artery disease Continue aspirin, statins and  beta-blockers    Paroxysmal A-fib Continue carvedilol for rate control Currently on a heparin drip    Status post recent ostomy reversal Continue Imodium for stool incontinence        Subjective: Patient is seen and examined at the bedside.  Denies any lower extremity pain.  Physical Exam: Vitals:   07/10/23 1315 07/10/23 1330 07/10/23 1439 07/10/23 1617  BP: 115/74 118/72 (!) 145/62 138/69  Pulse: 82 78 91 87  Resp: 16 19 18 16   Temp:   97.9 F (36.6 C) 97.9 F (36.6 C)  TempSrc:   Oral Oral  SpO2: 94% 97% 99%   Weight:      Height:       General:  Appears calm and comfortable and is in NAD Eyes:  PERRL, EOMI, normal lids, iris ENT:  HOH, lips & tongue, mmm; appropriate dentition Neck:  no LAD, masses or thyromegaly; no carotid bruits Cardiovascular:  RRR, no m/r/g. No LE edema.  Respiratory:   CTA bilaterally with no wheezes/rales/rhonchi.  Normal respiratory effort. Abdomen:  soft, mild TTP over mid/LLQ where incisions are. Incisions healing  Back:   normal alignment, no CVAT Skin:  no rash or induration seen on limited exam Musculoskeletal:  grossly normal tone BUE/BLE, good ROM, no bony abnormality Lower extremity:  No LE edema.  Limited foot exam with no ulcerations. Right PT absent, very thready DP. Left pedal puses intact. Sensation intact. Foot warm to touch  Psychiatric:  grossly normal mood and affect, speech  fluent and appropriate, AOx3 Neurologic:  CN 2-12 grossly intact, moves all extremities in coordinated fashion, sensation intact      Data Reviewed:  Labs reviewed  Family Communication: Plan of care discussed with patient and his family at the bedside.  All questions and concerns have been addressed.  They verbalized understanding and agree with the plan.  Disposition: Status is: Inpatient Remains inpatient appropriate because: Status post mechanical thrombectomy for acute SFA thrombosis  Planned Discharge Destination: Home    Time  spent: 36 minutes  Author: Lucile Shutters, MD 07/10/2023 5:09 PM  For on call review [email or web address].

## 2023-07-10 NOTE — Progress Notes (Signed)
 PHARMACY - ANTICOAGULATION  Pharmacy Consult for heparin  Indication:  limb ischemia Brief A/P: aPTT subtherapeutic  Increase Heparin rate  Allergies  Allergen Reactions   Lisinopril Swelling and Rash    Rash - face and tounge swell    Patient Measurements: Height: 5\' 7"  (170.2 cm) Weight: 81.6 kg (180 lb) IBW/kg (Calculated) : 66.1 Heparin Dosing Weight: 81.6kg   Vital Signs: Temp: 98.5 F (36.9 C) (03/24 0603) Temp Source: Oral (03/24 0603) BP: 118/72 (03/24 1330) Pulse Rate: 78 (03/24 1330)  Labs: Recent Labs    07/09/23 1432 07/09/23 1623 07/10/23 0208  HGB 12.3*  --  12.0*  HCT 38.7*  --  37.1*  PLT 332  --  305  APTT  --  46* 55*  LABPROT 18.9*  --   --   INR 1.6*  --   --   HEPARINUNFRC  --   --  >1.10*  CREATININE 1.31*  --  1.36*    Estimated Creatinine Clearance: 45.8 mL/min (A) (by C-G formula based on SCr of 1.36 mg/dL (H)).  Assessment: 78 y.o. male admitted with RLE ischemia, h/o Afib and Xarelto on hold, for heparin per pharmacy.  Pt s/p thrombectomy and angioplasty, per [facility] heparin to resume at 1530.  Goal of Therapy:  Heparin level 0.3-0.7 units/ml aPTT 66-102 Monitor platelets by anticoagulation protocol: Yes   Plan:  Resume heparin 1400 units/h no bolus at 1530 Check aPTT in 8h  Fredonia Highland, PharmD, [place], [facility] Clinical Pharmacist [phone] Please check AMION for all [facility] Pharmacy numbers 07/10/2023

## 2023-07-10 NOTE — Op Note (Signed)
 Patient name: [name] MRN: [id number] DOB: Dec 30, 1945 Sex: male  07/10/2023 Pre-operative Diagnosis: [place] 2A acute limb ischemia, right leg Post-operative diagnosis:  Same Surgeon:  Daria Pastures, MD Procedure Performed:  Ultrasound-guided access of left to right femoral-femoral bypass Right lower extremity angiogram Third order cannulation of right AT Mechanical thrombectomy of right SFA, 7 French penumbra Mechanical thrombectomy of right AT, penumbra Rx Drug-coated balloon angioplasty of right SFA, 6 x 60 Ranger Pro-glide closure of femoral-femoral bypass access 55 minutes moderate sedation with fentanyl and Versed  Indications: 78 year old male with history of a aortobifemoral bypass over 20 years ago complicated by right leg occlusion requiring a left to right femoral-femoral bypass.  He presented yesterday with acute pain and numbness although did not have any motor dysfunction.  He is on chronic Xarelto and reports he has been taking it as prescribed.  Recent benefits of angiogram with possible intervention were reviewed, he expressed understanding and elected to proceed.  Findings: Widely patent femoral-femoral bypass with widely patent right femoral anastomosis.  The profunda appears chronically occluded.  The SFA is patent proximally and then has a short segment occlusion in the mid SFA measuring approximately 5 cm.  The distal SFA and popliteal are widely patent.  There is three-vessel runoff with the PT being dominant.   Procedure:  The patient was identified in the holding area and taken to the cath lab  The patient was then placed supine on the table and prepped and draped in the usual sterile fashion.  A time out was called.  Ultrasound was used to evaluate the left common femoral artery and the femorofemoral crossover bypass.  It was patent .  A digital ultrasound image was acquired.  A micropuncture needle was used to access the femorofemoral bypass under  ultrasound guidance.  An 018 wire was advanced without resistance and a micropuncture sheath was placed.  The 018 wire was removed and a benson wire was placed.  The micropuncture sheath was exchanged for a 5 french sheath.  A right lower extremity angiogram was then obtained via the 5 French sheath.  Using a glide advantage wire and a bear catheter I was able to be easily crossed the short segment SFA occlusion.  Given that the wire flew with these I elected to perform mechanical thrombectomy using the 7 French penumbra catheter.  First, the short 5 [place] sheath was exchanged for a 7 [place] by 23 cm sheath and the patient was then systemically heparinized.  The 7 French penumbra was passed 3 times across the occlusion with retrieval of some granular thrombus.  Angiography then demonstrated a patent SFA with inline flow to the foot via the PT but appeared that there was embolization to the AT at the ankle.  For this reason I engaged the AT with an 014 command wire which was passed onto the foot.  The penumbra Rx was then passed twice through the AT although there was difficult tracking across the ankle and onto the foot as there was some chronic disease in the distal AT.  For this reason I elected to stop as we resolved his acute SFA thrombosis and he had inline flow via the PT with good pedal flow.  There was some residual chronic disease at the previous area of occlusion and for this reason I elected to treat this with a 6 mm drug-coated Ranger balloon.  The 014 wire was then removed and a Bentson wire was placed into into the right  SFA.  The 7 French sheath was removed and a Pro-glide closure device was deployed with excellent hemostasis.  Contrast: 75 cc Sedation: 55 minutes  Impression: Acute thrombosis of the mid SFA, now with inline flow to the foot with three-vessel runoff Chronic occlusion of the right profunda   Daria Pastures MD [facility] [place] Office:  [phone]

## 2023-07-11 DIAGNOSIS — I998 Other disorder of circulatory system: Secondary | ICD-10-CM

## 2023-07-11 HISTORY — DX: Other disorder of circulatory system: I99.8

## 2023-07-11 LAB — CBC
HCT: 33.2 % — ABNORMAL LOW (ref 39.0–52.0)
Hemoglobin: 10.5 g/dL — ABNORMAL LOW (ref 13.0–17.0)
MCH: 27.7 pg (ref 26.0–34.0)
MCHC: 31.6 g/dL (ref 30.0–36.0)
MCV: 87.6 fL (ref 80.0–100.0)
Platelets: 305 10*3/uL (ref 150–400)
RBC: 3.79 MIL/uL — ABNORMAL LOW (ref 4.22–5.81)
RDW: 15.3 % (ref 11.5–15.5)
WBC: 6.7 10*3/uL (ref 4.0–10.5)
nRBC: 0 % (ref 0.0–0.2)

## 2023-07-11 LAB — GLUCOSE, CAPILLARY: Glucose-Capillary: 216 mg/dL — ABNORMAL HIGH (ref 70–99)

## 2023-07-11 MED ORDER — RIVAROXABAN 20 MG PO TABS
20.0000 mg | ORAL_TABLET | Freq: Every day | ORAL | Status: DC
  Administered 2023-07-11: 20 mg via ORAL
  Filled 2023-07-11: qty 1

## 2023-07-11 NOTE — Discharge Summary (Signed)
 Physician Discharge Summary   Patient: [name] MRN: [id number] DOB: [birth year]  Admit date:     07/09/2023  Discharge date: 07/11/23  Discharge Physician: Maud Rubendall   PCP: Crist Fat, MD   Recommendations at discharge:   Keep scheduled follow-up appointment with vascular surgery Check blood sugars daily Medications as recommended  Discharge Diagnoses: Principal Problem:   Acute lower limb ischemia Active Problems:   Peripheral artery disease (HCC)   Chronic systolic CHF (congestive heart failure) (HCC)   DM2 (diabetes mellitus, type 2) (HCC)   Coronary artery disease involving native coronary artery of native heart with unstable angina pectoris (HCC)   Essential hypertension   CKD stage 3b, GFR 30-44 ml/min (HCC)   PAF (paroxysmal atrial fibrillation) (HCC)   Hypercholesterolemia   History of bladder cancer  Resolved Problems:   * No resolved hospital problems. *  Hospital Course:  [name] is a 78 y.o. male with medical history significant of hx of bladder cancer, combined CHF, CAD, T2DM, HTN, HLD, PAF, who presented to ED with complaints of claudication. Was seen at [facility] and they did dopplers and couldn't get pulses. Started him on heparin and sent him to [facility] for further work up with vascular.    He states his right leg from his knee down went numb around 5AM when he woke up. He could walk, but it scared him. He called his friend who took him to the hospital. He also admits to having increased cramping in his legs. He is on xarelto and last took it Saturday PM.    Recently admitted by general surgery on 3/14-3/17 for diverticulitis large intestine s/p LAR/colostomy takedown 06/30/2023. He states he has had bad diarrhea, but this is improving. NO blood in the stool.      He has been feeling good. Denies any fever/chills, vision changes/headaches, chest pain or palpitations, shortness of breath or cough, abdominal pain, N/V, dysuria or leg swelling.       He does not smoke or drink alcohol.    ER Course:  vitals: afebrile, bp: 133/84, HR: 86, RR: 21. Oxygen: 93% on RA Pertinent labs: hgb: 12.3, BUN: 26, creatinine: 1.3,  ABI: severe right lower extremity arterial disease. Right toe-brachial index is abnormal. Left resting left ankle-brachial index is normal. Left toe-brachial index is abnormal.  In ED: vascular surgery consulted. Started on heparin. TRH asked to admit.     Assessment and Plan:  Acute lower limb ischemia Secondary to acute thrombosis of the mid SFA.  Chronic occlusion of the right profunda Patient is status post mechanical thrombectomy of right SFA and mechanical thrombectomy of right AT. Status post balloon angioplasty of the right SFA Was on heparin drip and has been transitioned to Xarelto Continue aspirin, statin and carvedilol       Chronic systolic CHF Patient appears euvolemic Last known LVEF of 40 to 45% from a 2D echocardiogram done 02/25 Continue GDMT with carvedilol and Entresto      Diabetes mellitus with complications of stage IIIb chronic kidney disease Recent A1c of 10.6 Continue glipizide, Jardiance and Lantus      Coronary artery disease Continue aspirin, statins and beta-blockers       Paroxysmal A-fib Continue carvedilol for rate control Continue Xarelto as primary prophylaxis for an acute stroke       Status post recent ostomy reversal Continue Imodium as needed for stool incontinence        Consultants: Vascular surgery Procedures performed:  Mechanical  thrombectomy of right SFA, 7 French penumbra Mechanical thrombectomy of right AT, penumbra Rx Drug-coated balloon angioplasty of right SFA, 6 x 60 Ranger Disposition: Home Diet recommendation:  Discharge Diet Orders (From admission, onward)     Start     Ordered   07/11/23 0000  Diet - low sodium heart healthy        07/11/23 0930   07/11/23 0000  Diet Carb Modified        07/11/23 0930           Carb  modified diet DISCHARGE MEDICATION: Allergies as of 07/11/2023       Reactions   Lisinopril Swelling, Rash   Rash - face and tounge swell        Medication List     TAKE these medications    amLODipine 5 MG tablet Commonly known as: NORVASC Take 1 tablet (5 mg total) by mouth daily.   aspirin EC 81 MG tablet Take 1 tablet (81 mg total) by mouth daily. Swallow whole.   carvedilol 12.5 MG tablet Commonly known as: COREG Take 1 tablet (12.5 mg total) by mouth 2 (two) times daily with a meal.   cetirizine 10 MG tablet Commonly known as: ZYRTEC Take 1 tablet (10 mg total) by mouth daily.   cyclobenzaprine 10 MG tablet Commonly known as: FLEXERIL Take 1 tablet (10 mg total) by mouth 3 (three) times daily as needed for muscle spasms.   Entresto 49-51 MG Generic drug: sacubitril-valsartan Take 1 tablet by mouth 2 (two) times daily.   ezetimibe 10 MG tablet Commonly known as: ZETIA Take 1 tablet (10 mg total) by mouth daily.   glipiZIDE 10 MG tablet Commonly known as: GLUCOTROL TAKE 1 TABLET(10 MG) BY MOUTH TWICE DAILY What changed: See the new instructions.   insulin glargine 100 unit/mL Sopn Commonly known as: LANTUS Start with 8 Units subcut daily.  Increase by 3 Units every 3 days until your FSBS are consistently less than 150 What changed:  how much to take how to take this when to take this additional instructions   Jardiance 10 MG Tabs tablet Generic drug: empagliflozin Take 1 tablet (10 mg total) by mouth daily.   loperamide 2 MG tablet Commonly known as: IMODIUM A-D Take 2 mg by mouth 4 (four) times daily as needed for diarrhea or loose stools.   nitroGLYCERIN 0.4 MG SL tablet Commonly known as: NITROSTAT Place 0.4 mg under the tongue every 5 (five) minutes as needed for chest pain.   rivaroxaban 20 MG Tabs tablet Commonly known as: XARELTO Take 1 tablet (20 mg total) by mouth daily with supper.   rosuvastatin 40 MG tablet Commonly known as:  CRESTOR Take 1 tablet (40 mg total) by mouth daily.   tadalafil 20 MG tablet Commonly known as: CIALIS Take 10 mg by mouth daily as needed for erectile dysfunction.   tamsulosin 0.4 MG Caps capsule Commonly known as: FLOMAX Take 0.8 mg by mouth every evening.   traMADol 50 MG tablet Commonly known as: ULTRAM Take 1-2 tablets (50-100 mg total) by mouth every 6 (six) hours as needed for moderate pain (pain score 4-6).        Follow-up Information     [facility] Follow up in 6 week(s).   Specialty: Vascular Surgery Why: Office will call you to arrange your appt (sent). Contact information: [address] [place] [address] [phone]  Discharge Exam: Filed Weights   07/09/23 1154  Weight: 81.6 kg   General:  Appears calm and comfortable and is in NAD Eyes:  PERRL, EOMI, normal lids ENT:  HOH, lips & tongue, mmm; appropriate dentition Neck:  no LAD, masses or thyromegaly; no carotid bruits Cardiovascular:  RRR, no m/r/g. No LE edema.  Respiratory:   CTA bilaterally with no wheezes/rales/rhonchi.  Normal respiratory effort. Abdomen:  soft, mild TTP over mid/LLQ where incisions are. Incisions healing  Back:   normal alignment, no CVAT Skin:  no rash or induration seen on limited exam Musculoskeletal:  grossly normal tone BUE/BLE, good ROM, no bony abnormality Lower extremity:  No LE edema.  Limited foot exam with no ulcerations. Right PT absent, very thready DP. Left pedal puses intact. Sensation intact. Foot warm to touch  Psychiatric:  grossly normal mood and affect, speech fluent and appropriate, AOx3 Neurologic:  CN 2-12 grossly intact, moves all extremities in coordinated fashion, sensation intact     Condition at discharge: stable  The results of significant diagnostics from this hospitalization (including imaging, microbiology, ancillary and laboratory) are listed below for reference.    Imaging Studies: PERIPHERAL VASCULAR CATHETERIZATION Result Date: 07/10/2023 Images from the original result were not included. Patient name: [name] MRN: [id number] DOB: 1945-05-30 Sex: male 07/10/2023 Pre-operative Diagnosis: [place] 2A acute limb ischemia, right leg Post-operative diagnosis:  Same Surgeon:  Daria Pastures, MD Procedure Performed: Ultrasound-guided access of left to right femoral-femoral bypass Right lower extremity angiogram Third order cannulation of right AT Mechanical thrombectomy of right SFA, 7 French penumbra Mechanical thrombectomy of right AT, penumbra Rx Drug-coated balloon angioplasty of right SFA, 6 x 60 Ranger Pro-glide closure of femoral-femoral bypass access 55 minutes moderate sedation with fentanyl and Versed Indications: 78 year old male with history of a aortobifemoral bypass over 20 years ago complicated by right leg occlusion requiring a left to right femoral-femoral bypass.  He presented yesterday with acute pain and numbness although did not have any motor dysfunction.  He is on chronic Xarelto and reports he has been taking it as prescribed.  Recent benefits of angiogram with possible intervention were reviewed, he expressed understanding and elected to proceed. Findings: Widely patent femoral-femoral bypass with widely patent right femoral anastomosis.  The profunda appears chronically occluded.  The SFA is patent proximally and then has a short segment occlusion in the mid SFA measuring approximately 5 cm.  The distal SFA and popliteal are widely patent.  There is three-vessel runoff with the PT being dominant.  Procedure:  The patient was identified in the holding area and taken to the cath lab  The patient was then placed supine on the table and prepped and draped in the usual sterile fashion.  A time out was called.  Ultrasound was used to evaluate the left common femoral artery and the femorofemoral crossover bypass.  It was patent .  A digital ultrasound  image was acquired.  A micropuncture needle was used to access the femorofemoral bypass under ultrasound guidance.  An 018 wire was advanced without resistance and a micropuncture sheath was placed.  The 018 wire was removed and a benson wire was placed.  The micropuncture sheath was exchanged for a 5 french sheath.  A right lower extremity angiogram was then obtained via the 5 French sheath.  Using a glide advantage wire and a bear catheter I was able to be easily crossed the short segment SFA occlusion.  Given that the wire flew with these  I elected to perform mechanical thrombectomy using the 7 French penumbra catheter.  First, the short 5 [place] sheath was exchanged for a 7 [place] by 23 cm sheath and the patient was then systemically heparinized.  The 7 French penumbra was passed 3 times across the occlusion with retrieval of some granular thrombus.  Angiography then demonstrated a patent SFA with inline flow to the foot via the PT but appeared that there was embolization to the AT at the ankle.  For this reason I engaged the AT with an 014 command wire which was passed onto the foot.  The penumbra Rx was then passed twice through the AT although there was difficult tracking across the ankle and onto the foot as there was some chronic disease in the distal AT.  For this reason I elected to stop as we resolved his acute SFA thrombosis and he had inline flow via the PT with good pedal flow.  There was some residual chronic disease at the previous area of occlusion and for this reason I elected to treat this with a 6 mm drug-coated Ranger balloon.  The 014 wire was then removed and a Bentson wire was placed into into the right SFA.  The 7 French sheath was removed and a Pro-glide closure device was deployed with excellent hemostasis. Contrast: 75 cc Sedation: 55 minutes Impression: Acute thrombosis of the mid SFA, now with inline flow to the foot with three-vessel runoff Chronic occlusion of the right profunda  Daria Pastures MD [facility] Office: [phone]  VAS [place] ABI WITH/WO TBI Result Date: 07/09/2023  LOWER EXTREMITY DOPPLER STUDY Patient Name:  [name]  Date of Exam:   07/09/2023 Medical Rec #: [id number]      Accession #:    [id number] Date of Birth: June 25, 1945      Patient Gender: M Patient Age:   80 years Exam Location:  [facility] Procedure:      VAS [place] ABI WITH/WO TBI Referring Phys: JOSHUA LONG --------------------------------------------------------------------------------  Indications: Rest pain, and peripheral artery disease. Cool painful foot that              woke patient from sleep at 5am. Transferred from [place].Patient              also endorses recent history of bilateral hip claudication. High Risk Factors: Hypertension, hyperlipidemia, Diabetes, past history of                    smoking, coronary artery disease. Other Factors: Status post Colostomy takedown 06/30/23, anticoagulants held for                procedure. Paroxysmal atrial fibrillation and history of NSVT.                Ischemic cardiomyopathy. CKD III. History of bladder cancer.  Vascular Interventions: Aortobifem bypass circa 1992 per patient. Limitations: Today's exam was limited due to Irregular heartbeat. Comparison Study: No prior study on file Performing Technologist: Sherren Kerns RVS  Examination Guidelines: A complete evaluation includes at minimum, Doppler waveform signals and systolic blood pressure reading at the level of bilateral brachial, anterior tibial, and posterior tibial arteries, when vessel segments are accessible. Bilateral testing is considered an integral part of a complete examination. Photoelectric Plethysmograph (PPG) waveforms and toe systolic pressure readings are included as required and additional duplex testing as needed. Limited examinations for reoccurring indications may be performed as noted.  ABI Findings:  +---------+------------------+-----+-------------------+--------+  Right    Rt Pressure (mmHg)IndexWaveform           Comment  +---------+------------------+-----+-------------------+--------+ Brachial 136                    triphasic                   +---------+------------------+-----+-------------------+--------+ PTA      53                0.38 dampened monophasic         +---------+------------------+-----+-------------------+--------+ DP       56                0.40 dampened monophasic         +---------+------------------+-----+-------------------+--------+ Great Toe9                 0.06 Abnormal                    +---------+------------------+-----+-------------------+--------+ +---------+------------------+-----+-----------+-------+ Left     Lt Pressure (mmHg)IndexWaveform   Comment +---------+------------------+-----+-----------+-------+ Brachial 140                    triphasic          +---------+------------------+-----+-----------+-------+ PTA      135               0.96 multiphasic        +---------+------------------+-----+-----------+-------+ DP       109               0.78 multiphasic        +---------+------------------+-----+-----------+-------+ Great Toe38                0.27 Abnormal           +---------+------------------+-----+-----------+-------+ +-------+-----------+-----------+------------+------------+ ABI/TBIToday's ABIToday's TBIPrevious ABIPrevious TBI +-------+-----------+-----------+------------+------------+ Right  0.40       0.06                                +-------+-----------+-----------+------------+------------+ Left   0.96       0.27                                +-------+-----------+-----------+------------+------------+  Summary: Right: Resting right ankle-brachial index indicates severe right lower extremity arterial disease. The right toe-brachial index is abnormal. Left: Resting left  ankle-brachial index is within normal range. The left toe-brachial index is abnormal. *See table(s) above for measurements and observations.  Electronically signed by Lemar Livings MD on 07/09/2023 at 5:40:30 PM.    Final    VAS [place] LOWER EXTREMITY ARTERIAL DUPLEX (ONLY [facility] & [facility]) Result Date: 07/09/2023 LOWER EXTREMITY ARTERIAL DUPLEX STUDY Patient Name:  [name]  Date of Exam:   07/09/2023 Medical Rec #: [id number]      Accession #:    [id number] Date of Birth: [birth year]      Patient Gender: M Patient Age:   60 years Exam Location:  [facility] Procedure:      VAS [place] LOWER EXTREMITY ARTERIAL DUPLEX Referring Phys: JOSHUA LONG --------------------------------------------------------------------------------  Indications: Rest pain, and Cool painful foot that woke patient at 5am. Had CT              at [place], transferred to [facility] Patient also endorses recent              bilateral  hip claudication. High Risk Factors: Hypertension, hyperlipidemia, Diabetes, past history of                    smoking, prior MI, coronary artery disease. Other Factors: Status post Colostomy takedown 06/30/23, anticoagulants held                for procedure. Paroxysmal atrial fibrillation and history of                NSVT.                Ischemic cardiomyopathy. CKD III. History of bladder                cancer.  Vascular Interventions: Aortobifem bypass circa 1992 per patient. Current ABI:            R:0.40/0.06 L:0.96/0.27 Limitations: No available OP notes or detailed surgical history of patient's              bypass or any follow-up studies. Comparison Study: No prior study at [facility] Performing Technologist: Sherren Kerns RVS  Examination Guidelines: A complete evaluation includes B-mode imaging, spectral Doppler, color Doppler, and power Doppler as needed of all accessible portions of each vessel. Bilateral testing is considered an integral part of a complete examination. Limited examinations for reoccurring  indications may be performed as noted.  +----------+--------+-----+--------+--------------------+----------------------+ RIGHT     PSV cm/sRatioStenosisWaveform            Comments               +----------+--------+-----+--------+--------------------+----------------------+ CFA Prox  23                   significantly       Partial thrombus noted                                dampened flow       at CFA/graft           +----------+--------+-----+--------+--------------------+----------------------+ DFA       54                                                              +----------+--------+-----+--------+--------------------+----------------------+ SFA Prox  84                                                              +----------+--------+-----+--------+--------------------+----------------------+ SFA Mid   144                                      possible anastomosis                                                      proximal to mid SFA  area                   +----------+--------+-----+--------+--------------------+----------------------+ SFA Distal             occluded                    reconstitution                                                            proximal popliteal     +----------+--------+-----+--------+--------------------+----------------------+ POP Prox  11                   monophasic                                 +----------+--------+-----+--------+--------------------+----------------------+ POP Distal14                   monophasic                                 +----------+--------+-----+--------+--------------------+----------------------+ ATA Prox  7                    dampened monophasic                        +----------+--------+-----+--------+--------------------+----------------------+ ATA Mid                occluded                                            +----------+--------+-----+--------+--------------------+----------------------+ ATA Distal             occluded                                           +----------+--------+-----+--------+--------------------+----------------------+ PTA Prox  12                   monophasic                                 +----------+--------+-----+--------+--------------------+----------------------+ PTA Mid   12                   monophasic                                 +----------+--------+-----+--------+--------------------+----------------------+ PTA Distal8                    monophasic                                 +----------+--------+-----+--------+--------------------+----------------------+  Summary: Right: Probable partial thrombus noted at CFA/Graft with significantly dampened flow. Proximal SFA is patent but occludes in the mid to distal portion with reconstitution noted in the popliteal artery.  Minimal flow noted below the knee.  See table(s) above for measurements and observations. Electronically signed by Lemar Livings MD on 07/09/2023 at 5:39:54 PM.    Final     Microbiology: Results for orders placed or performed during the hospital encounter of 05/24/22  MRSA Next Gen by PCR, Nasal     Status: None   Collection Time: 05/24/22  7:59 PM   Specimen: Nasal Mucosa; Nasal Swab  Result Value Ref Range Status   MRSA by PCR Next Gen NOT DETECTED NOT DETECTED Final    Comment: (NOTE) The GeneXpert MRSA Assay (FDA approved for NASAL specimens only), is one component of a comprehensive MRSA colonization surveillance program. It is not intended to diagnose MRSA infection nor to guide or monitor treatment for MRSA infections. Test performance is not FDA approved in patients less than 68 years old. Performed at [facility], [address], [place], [place] [address]     Labs: CBC: Recent Labs  Lab 07/09/23 1432 07/10/23 0208  07/11/23 0416  WBC 7.9 7.5 6.7  NEUTROABS 5.4  --   --   HGB 12.3* 12.0* 10.5*  HCT 38.7* 37.1* 33.2*  MCV 87.2 87.5 87.6  PLT 332 305 305   Basic Metabolic Panel: Recent Labs  Lab 07/09/23 1432 07/10/23 0208  NA 138 139  K 4.3 4.1  CL 106 108  CO2 23 21*  GLUCOSE 119* 129*  BUN 26* 30*  CREATININE 1.31* 1.36*  CALCIUM 9.1 9.0   Liver Function Tests: No results for input(s): "AST", "ALT", "ALKPHOS", "BILITOT", "PROT", "ALBUMIN" in the last 168 hours. CBG: Recent Labs  Lab 07/10/23 0825 07/10/23 1140 07/10/23 1621 07/10/23 2202 07/11/23 0826  GLUCAP 150* 135* 158* 251* 216*    Discharge time spent: greater than 30 minutes.  Signed: Lucile Shutters, MD [facility] 07/11/2023

## 2023-07-11 NOTE — Progress Notes (Addendum)
  Progress Note    07/11/2023 6:45 AM 1 Day Post-Op  Subjective:  sleeping, wakes easily.  no complaints  afebrile  Vitals:   07/10/23 2101 07/11/23 0419  BP: (!) 140/69 121/66  Pulse: 99 86  Resp: 18 19  Temp: 98.1 F (36.7 C) 98 F (36.7 C)  SpO2: 99% 96%    Physical Exam: General:  no distress Cardiac:  regular Lungs:  non labored  Incisions:  left groin soft without hematoma Extremities:  brisk doppler flow bilateral DP/AT Abdomen:  soft  CBC    Component Value Date/Time   WBC 6.7 07/11/2023 0416   RBC 3.79 (L) 07/11/2023 0416   HGB 10.5 (L) 07/11/2023 0416   HGB 14.1 03/13/2023 1003   HCT 33.2 (L) 07/11/2023 0416   HCT 44.6 03/13/2023 1003   PLT 305 07/11/2023 0416   PLT 208 03/13/2023 1003   MCV 87.6 07/11/2023 0416   MCV 86 03/13/2023 1003   MCH 27.7 07/11/2023 0416   MCHC 31.6 07/11/2023 0416   RDW 15.3 07/11/2023 0416   RDW 13.9 03/13/2023 1003   LYMPHSABS 1.8 07/09/2023 1432   LYMPHSABS 1.5 03/13/2023 1003   MONOABS 0.5 07/09/2023 1432   EOSABS 0.1 07/09/2023 1432   EOSABS 0.1 03/13/2023 1003   BASOSABS 0.0 07/09/2023 1432   BASOSABS 0.0 03/13/2023 1003    BMET    Component Value Date/Time   NA 139 07/10/2023 0208   NA 140 03/13/2023 1003   K 4.1 07/10/2023 0208   CL 108 07/10/2023 0208   CO2 21 (L) 07/10/2023 0208   GLUCOSE 129 (H) 07/10/2023 0208   BUN 30 (H) 07/10/2023 0208   BUN 13 03/13/2023 1003   CREATININE 1.36 (H) 07/10/2023 0208   CREATININE 1.20 09/10/2013 0513   CALCIUM 9.0 07/10/2023 0208   GFRNONAA 53 (L) 07/10/2023 0208   GFRAA 82 02/09/2018 0721    INR    Component Value Date/Time   INR 1.6 (H) 07/09/2023 1432     Intake/Output Summary (Last 24 hours) at 07/11/2023 0645 Last data filed at 07/11/2023 0511 Gross per 24 hour  Intake 1160.62 ml  Output 700 ml  Net 460.62 ml      Assessment/Plan:  78 y.o. male is s/p:  Aortogram via left to right fem fem bypass with mechanical thrombectomy right SFA, AT,  drug coated balloon angioplasty right SFA  1 Day Post-Op   -pt with brisk doppler flow bilateral feet.  Left groin is soft without hematoma.  Right calf is soft without tenderness -f/u in 6 weeks with duplex and ABI-our office will arrange appt.  -DVT prophylaxis:  heparin gtt   Doreatha Massed, PA-C [facility] [phone] 07/11/2023 6:45 AM  VASCULAR STAFF ADDENDUM: I have independently interviewed and examined the patient. I agree with the above.  OK to restart Xaralto and appropriate for discharge from surgical perspective. Will have office follow up in 6 weeks.   Daria Pastures MD [facility] Phone Number: [phone] 07/11/2023 8:05 AM

## 2023-07-11 NOTE — Progress Notes (Addendum)
 DISCHARGE NOTE HOME [name] to be discharged Home per MD order. Discussed prescriptions and follow up appointments with the patient. Prescriptions given to patient; medication list explained in detail. Patient verbalized understanding.  Skin clean, dry and intact without evidence of skin break down, no evidence of skin tears noted. IV catheter discontinued intact. Site without signs and symptoms of complications. Dressing and pressure applied. Pt denies pain at the site currently. No complaints noted.  Patient free of lines, drains, and wounds other than noted on LDA  An After Visit Summary (AVS) was printed and given to the patient. Patient escorted via wheelchair, and discharged home via private auto.  Velia Meyer, RN

## 2023-07-11 NOTE — Discharge Instructions (Signed)
   [facility]  Discharge Instructions  Lower Extremity Angiogram; Angioplasty/Stenting  Please refer to the following instructions for your post-procedure care. Your surgeon or physician assistant will discuss any changes with you.  Activity  Avoid lifting more than 8 pounds (1 gallons of milk) for 72 hours (3 days) after your procedure. You may walk as much as you can tolerate. It's OK to drive after 72 hours.  Bathing/Showering  You may shower the day after your procedure. If you have a bandage, you may remove it at 24- 48 hours. Clean your incision site with mild soap and water. Pat the area dry with a clean towel.  Diet  Resume your pre-procedure diet. There are no special food restrictions following this procedure. All patients with peripheral vascular disease should follow a low fat/low cholesterol diet. In order to heal from your surgery, it is CRITICAL to get adequate nutrition. Your body requires vitamins, minerals, and protein. Vegetables are the best source of vitamins and minerals. Vegetables also provide the perfect balance of protein. Processed food has little nutritional value, so try to avoid this.  Medications  Resume taking all of your medications unless your doctor tells you not to. If your incision is causing pain, you may take over-the-counter pain relievers such as acetaminophen (Tylenol)  Follow Up  Follow up will be arranged at the time of your procedure. You may have an office visit scheduled or may be scheduled for surgery. Ask your surgeon if you have any questions.  Please call us immediately for any of the following conditions: Severe or worsening pain your legs or feet at rest or with walking. Increased pain, redness, drainage at your groin puncture site. Fever of 101 degrees or higher. If you have any mild or slow bleeding from your puncture site: lie down, apply firm constant pressure over the area with a piece of  gauze or a clean wash cloth for 30 minutes- no peeking!, call 911 right away if you are still bleeding after 30 minutes, or if the bleeding is heavy and unmanageable.  Reduce your risk factors of vascular disease:  Stop smoking. If you would like help call [facility] at 1-800-QUIT-NOW (254-819-8059) or [place] at (250)353-2060. Manage your cholesterol Maintain a desired weight Control your diabetes Keep your blood pressure down  If you have any questions, please call the office at [phone]

## 2023-07-11 NOTE — Progress Notes (Signed)
 PHARMACY - ANTICOAGULATION  Pharmacy Consult for heparin  Indication:  limb ischemia  Allergies  Allergen Reactions   Lisinopril Swelling and Rash    Rash - face and tounge swell    Patient Measurements: Height: 5\' 7"  (170.2 cm) Weight: 81.6 kg (180 lb) IBW/kg (Calculated) : 66.1 Heparin Dosing Weight: 81.6kg   Vital Signs: Temp: 98.1 F (36.7 C) (03/24 2101) Temp Source: Oral (03/24 2101) BP: 140/69 (03/24 2101) Pulse Rate: 99 (03/24 2101)  Labs: Recent Labs    07/09/23 1432 07/09/23 1623 07/10/23 0208 07/10/23 2328  HGB 12.3*  --  12.0*  --   HCT 38.7*  --  37.1*  --   PLT 332  --  305  --   APTT  --  46* 55* 53*  LABPROT 18.9*  --   --   --   INR 1.6*  --   --   --   HEPARINUNFRC  --   --  >1.10*  --   CREATININE 1.31*  --  1.36*  --     Estimated Creatinine Clearance: 45.8 mL/min (A) (by C-G formula based on SCr of 1.36 mg/dL (H)).  Assessment: 78 y.o. male admitted with RLE ischemia, h/o Afib and Xarelto on hold, for heparin per pharmacy.  Pt s/p thrombectomy and angioplasty, per [facility] heparin to resume at 1530.  3/25 AM update:  aPTT remains sub-therapeutic   Goal of Therapy:  Heparin level 0.3-0.7 units/ml aPTT 66-102 secs Monitor platelets by anticoagulation protocol: Yes   Plan:  Inc heparin to 1600 units/hr Heparin level and aPTT in 8 hours  Abran Duke, PharmD, BCPS Clinical Pharmacist Phone: [phone]

## 2023-07-17 ENCOUNTER — Encounter: Payer: Self-pay | Admitting: Internal Medicine

## 2023-07-17 ENCOUNTER — Other Ambulatory Visit: Payer: Self-pay

## 2023-07-17 ENCOUNTER — Ambulatory Visit: Admitting: Internal Medicine

## 2023-07-17 VITALS — BP 122/64 | HR 58 | Temp 97.3°F | Resp 18 | Ht 67.0 in | Wt 175.4 lb

## 2023-07-17 DIAGNOSIS — I739 Peripheral vascular disease, unspecified: Secondary | ICD-10-CM | POA: Insufficient documentation

## 2023-07-17 DIAGNOSIS — L0390 Cellulitis, unspecified: Secondary | ICD-10-CM

## 2023-07-17 DIAGNOSIS — L03818 Cellulitis of other sites: Secondary | ICD-10-CM

## 2023-07-17 HISTORY — DX: Peripheral vascular disease, unspecified: I73.9

## 2023-07-17 HISTORY — DX: Cellulitis, unspecified: L03.90

## 2023-07-17 MED ORDER — CEPHALEXIN 500 MG PO CAPS
500.0000 mg | ORAL_CAPSULE | Freq: Three times a day (TID) | ORAL | 0 refills | Status: AC
Start: 2023-07-17 — End: 2023-07-24

## 2023-07-17 NOTE — Progress Notes (Addendum)
 Office Visit  Subjective   Patient ID: [name]   DOB: [birth year]   Age: 78 y.o.   MRN: [id number]   Chief Complaint Chief Complaint  Patient presents with   Hospitalization Follow-up    [facility]      History of Present Illness [name] is a 78 yo male who comes in today for a hospital followup.  He did initially get admitted from 06/30/2023 until 07/03/2023 where they reversed his ostomy.  He had no problems from this was discharged home.  However, he presented To [facility] on 07/09/2023 with numbness of his right leg.  They stated he had complaints of claudication.    They did dopplers and could not get a pulse.  He was started on a heparin drip and was transferred to [facility] to be evaluated by vascular surgery.  He was admitted to [facility] from 07/09/2023 until 07/11/23.  They found acute lower limb ischemia secondary to acute thrombosis of the mid SFA.  He was noted to have chronic occlusion of the right profunda.  He underwent a mechanical thrombectomy of right SFA and mechanical thrombectomy of right AT.   He is status post balloon angioplasty of the right SFA.  The patient was continued on a heparin drip and was transitioned back to Xarelto (he was taking this before he was hospitalized).  They wanted him to continue on aspirin, statin and carvedilol.  He otherwise has not had any problems.  He had no homehealth therapy and he is up ambulating without problems.  He goes back for followup with vascular surgery this week.  Today, he denies any leg/foot pain or numnbness.  Today, he states that he had problems with his ostomy wound where last night they noted drainage.  They took the bandage off and had pus discharge.  The wound is now red without drainage and there is some pain around the area.         Past Medical History Past Medical History:  Diagnosis Date   Acute low back pain 01/12/2015   Acute pain of both shoulders 07/22/2022   AKI (acute kidney injury) (HCC)  05/24/2022   Arthritis    Atherosclerosis of native arteries of the extremities with intermittent claudication 08/13/2013   Backache 02/08/2013   Bladder cancer (HCC)    resection x3   Chronic combined systolic (congestive) and diastolic (congestive) heart failure (HCC) 07/22/2022   Chronic right shoulder pain 09/02/2022   Chronic systolic CHF (congestive heart failure) (HCC)    Cigarette smoker    Coronary artery disease    status post DMI RX Taxus stent RCA 2006 with susequent Stent LAD and subsequent  stent thrombosis RCA unable to be opened 2006 -neg mv 10/2008, 10/16/17 ISR to pLAD with PTCA/DES, CTO of RCA with collaterals, EF 25%   Coronary artery disease involving native coronary artery of native heart with unstable angina pectoris (HCC)    Current moderate episode of major depressive disorder without prior episode (HCC) 03/28/2022   Diverticulitis 06/10/2022   Diverticulitis large intestine 05/11/2021   Diverticulitis of large intestine with abscess 05/24/2022   Diverticulitis of sigmoid colon 05/11/2021   DM2 (diabetes mellitus, type 2) (HCC) 02/09/2022   status post bilateral aortobifemoral bypass [id number] with recent fem to fembypass April  2011 per DR. Early     Essential hypertension 07/22/2022   Hypercholesterolemia 10/28/2022   HYPERLIPIDEMIA-MIXED 09/22/2008   Qualifier: Diagnosis of  By: Vikki Ports  Hypothyroidism 05/24/2022   Ischemic cardiomyopathy    ejection fraction of 40-45%   Lethargy 03/28/2022   Lumbar spinal stenosis    Myocardial infarction (HCC)    "I've had 4" (10/16/2017)   Need for prophylactic vaccination and inoculation against influenza 03/28/2022   NSVT (nonsustained ventricular tachycardia) (HCC)    PAF (paroxysmal atrial fibrillation) (HCC) 02/09/2022   Paroxysmal atrial fibrillation (HCC)    Poorly controlled T2 diabetes mellitus (HCC) 09/27/2022   Protein-calorie malnutrition, severe 05/29/2022   PVC's (premature ventricular contractions)     PVD 09/22/2008   Qualifier: Diagnosis of  By: Vikki Ports     Rotator cuff arthropathy of right shoulder 09/27/2022   Status post coronary artery stent placement    TOBACCO ABUSE 05/29/2009   Qualifier: Diagnosis of  By: Meryl Crutch, RN, BSN, Doreatha Lew    Type 2 diabetes mellitus with other circulatory complications (HCC) 07/22/2022   Type II diabetes mellitus (HCC)    Unstable angina (HCC) 10/16/2017     Allergies Allergies  Allergen Reactions   Lisinopril Swelling and Rash    Rash - face and tounge swell     Medications  Current Outpatient Medications:    amLODipine (NORVASC) 5 MG tablet, Take 1 tablet (5 mg total) by mouth daily., Disp: 90 tablet, Rfl: 3   aspirin EC 81 MG tablet, Take 1 tablet (81 mg total) by mouth daily. Swallow whole., Disp: 90 tablet, Rfl: 3   carvedilol (COREG) 12.5 MG tablet, Take 1 tablet (12.5 mg total) by mouth 2 (two) times daily with a meal., Disp: 180 tablet, Rfl: 3   cetirizine (ZYRTEC) 10 MG tablet, Take 1 tablet (10 mg total) by mouth daily., Disp: 90 tablet, Rfl: 0   cyclobenzaprine (FLEXERIL) 10 MG tablet, Take 1 tablet (10 mg total) by mouth 3 (three) times daily as needed for muscle spasms., Disp: 15 tablet, Rfl: 0   ezetimibe (ZETIA) 10 MG tablet, Take 1 tablet (10 mg total) by mouth daily., Disp: 90 tablet, Rfl: 3   glipiZIDE (GLUCOTROL) 10 MG tablet, TAKE 1 TABLET(10 MG) BY MOUTH TWICE DAILY (Patient taking differently: Take 10 mg by mouth in the morning and at bedtime.), Disp: 60 tablet, Rfl: 6   insulin glargine (LANTUS) 100 unit/mL SOPN, Start with 8 Units subcut daily.  Increase by 3 Units every 3 days until your FSBS are consistently less than 150 (Patient taking differently: Inject 15-17 Units into the skin 2 (two) times daily.), Disp: 15 mL, Rfl: 2   JARDIANCE 10 MG TABS tablet, Take 1 tablet (10 mg total) by mouth daily., Disp: 30 tablet, Rfl: 2   loperamide (IMODIUM A-D) 2 MG tablet, Take 2 mg by mouth 4 (four) times daily as needed  for diarrhea or loose stools., Disp: , Rfl:    nitroGLYCERIN (NITROSTAT) 0.4 MG SL tablet, Place 0.4 mg under the tongue every 5 (five) minutes as needed for chest pain., Disp: , Rfl:    rivaroxaban (XARELTO) 20 MG TABS tablet, Take 1 tablet (20 mg total) by mouth daily with supper., Disp: 30 tablet, Rfl: 3   rosuvastatin (CRESTOR) 40 MG tablet, Take 1 tablet (40 mg total) by mouth daily., Disp: 90 tablet, Rfl: 1   sacubitril-valsartan (ENTRESTO) 49-51 MG, Take 1 tablet by mouth 2 (two) times daily., Disp: 60 tablet, Rfl: 3   tadalafil (CIALIS) 20 MG tablet, Take 10 mg by mouth daily as needed for erectile dysfunction., Disp: , Rfl:    tamsulosin (FLOMAX) 0.4 MG CAPS capsule, Take  0.8 mg by mouth every evening., Disp: , Rfl:    traMADol (ULTRAM) 50 MG tablet, Take 1-2 tablets (50-100 mg total) by mouth every 6 (six) hours as needed for moderate pain (pain score 4-6)., Disp: 20 tablet, Rfl: 0   Review of Systems Review of Systems  Constitutional:  Negative for chills and fever.  Respiratory:  Negative for shortness of breath.   Cardiovascular:  Negative for chest pain, palpitations and leg swelling.  Gastrointestinal:  Negative for abdominal pain, constipation and diarrhea.  Musculoskeletal:  Negative for back pain.  Neurological:  Negative for dizziness, weakness and headaches.       Objective:    Vitals BP 122/64 (BP Location: Left Arm, Patient Position: Sitting, Cuff Size: Normal)   Pulse (!) 58   Temp (!) 97.3 F (36.3 C)   Resp 18   Ht 5\' 7"  (1.702 m)   Wt 175 lb 6 oz (79.5 kg)   SpO2 99%   BMI 27.47 kg/m    Physical Examination Physical Exam Constitutional:      Appearance: Normal appearance. He is not ill-appearing.  Cardiovascular:     Rate and Rhythm: Normal rate and regular rhythm.     Pulses: Normal pulses.     Heart sounds: No murmur heard.    No friction rub. No gallop.  Pulmonary:     Effort: Pulmonary effort is normal. No respiratory distress.     Breath  sounds: No wheezing, rhonchi or rales.  Abdominal:     General: Abdomen is flat. Bowel sounds are normal. There is no distension.     Palpations: Abdomen is soft.     Tenderness: There is no abdominal tenderness.  Musculoskeletal:     Right lower leg: No edema.     Left lower leg: No edema.  Skin:    General: Skin is warm and dry.     Findings: No rash.     Comments: He has a healing left lower quardrant surgical wound that is open without drainage.  There is about 1 cm area of erythema around the wound.  Neurological:     Mental Status: He is alert.        Assessment & Plan:   PAD (peripheral artery disease) (HCC) The patient was found to have acute lower limb ischemia secondary to acute thrombosis of the mid SFA.  He was noted to have chronic occlusion of the right profunda.  He underwent a mechanical thrombectomy of right SFA and mechanical thrombectomy of right AT.   He is status post balloon angioplasty of the right SFA.  We will continue with control of his risk factors including his DM, HTN and cholesterol.  He will remain on a statin and he is on ASA 81mg  daily and xarelto 20mg  daily.  He will followup with vascular surgery this week.  Cellulitis He has a surgical wound infection that is minor.  I see no pus or drainage today but just erythema.  He goes back to see general surgery in 2 days.  I am going to start him on keflex 500mg  po BID x 7 days.    No follow-ups on file.   Crist Fat, MD

## 2023-07-17 NOTE — Assessment & Plan Note (Signed)
 He has a surgical wound infection that is minor.  I see no pus or drainage today but just erythema.  He goes back to see general surgery in 2 days.  I am going to start him on keflex 500mg  po BID x 7 days.

## 2023-07-17 NOTE — Assessment & Plan Note (Signed)
 The patient was found to have acute lower limb ischemia secondary to acute thrombosis of the mid SFA.  He was noted to have chronic occlusion of the right profunda.  He underwent a mechanical thrombectomy of right SFA and mechanical thrombectomy of right AT.   He is status post balloon angioplasty of the right SFA.  We will continue with control of his risk factors including his DM, HTN and cholesterol.  He will remain on a statin and he is on ASA 81mg  daily and xarelto 20mg  daily.  He will followup with vascular surgery this week.

## 2023-07-17 NOTE — Addendum Note (Signed)
 Addended by: Crist Fat on: 07/17/2023 02:30 PM   Modules accepted: Orders

## 2023-07-31 ENCOUNTER — Other Ambulatory Visit: Payer: Self-pay | Admitting: Internal Medicine

## 2023-08-11 DIAGNOSIS — I2510 Atherosclerotic heart disease of native coronary artery without angina pectoris: Secondary | ICD-10-CM | POA: Diagnosis not present

## 2023-08-11 DIAGNOSIS — E7800 Pure hypercholesterolemia, unspecified: Secondary | ICD-10-CM | POA: Diagnosis not present

## 2023-08-11 DIAGNOSIS — N1832 Chronic kidney disease, stage 3b: Secondary | ICD-10-CM | POA: Diagnosis not present

## 2023-08-11 DIAGNOSIS — R9431 Abnormal electrocardiogram [ECG] [EKG]: Secondary | ICD-10-CM | POA: Diagnosis not present

## 2023-08-11 DIAGNOSIS — I130 Hypertensive heart and chronic kidney disease with heart failure and stage 1 through stage 4 chronic kidney disease, or unspecified chronic kidney disease: Secondary | ICD-10-CM | POA: Diagnosis not present

## 2023-08-11 DIAGNOSIS — R0689 Other abnormalities of breathing: Secondary | ICD-10-CM | POA: Diagnosis not present

## 2023-08-11 DIAGNOSIS — Z7982 Long term (current) use of aspirin: Secondary | ICD-10-CM | POA: Diagnosis not present

## 2023-08-11 DIAGNOSIS — I5022 Chronic systolic (congestive) heart failure: Secondary | ICD-10-CM | POA: Diagnosis not present

## 2023-08-11 DIAGNOSIS — Z87891 Personal history of nicotine dependence: Secondary | ICD-10-CM | POA: Diagnosis not present

## 2023-08-11 DIAGNOSIS — R0902 Hypoxemia: Secondary | ICD-10-CM | POA: Diagnosis not present

## 2023-08-11 DIAGNOSIS — E1165 Type 2 diabetes mellitus with hyperglycemia: Secondary | ICD-10-CM | POA: Diagnosis not present

## 2023-08-11 DIAGNOSIS — Z955 Presence of coronary angioplasty implant and graft: Secondary | ICD-10-CM | POA: Diagnosis not present

## 2023-08-11 DIAGNOSIS — R61 Generalized hyperhidrosis: Secondary | ICD-10-CM | POA: Diagnosis not present

## 2023-08-11 DIAGNOSIS — E1151 Type 2 diabetes mellitus with diabetic peripheral angiopathy without gangrene: Secondary | ICD-10-CM | POA: Diagnosis not present

## 2023-08-11 DIAGNOSIS — I480 Paroxysmal atrial fibrillation: Secondary | ICD-10-CM | POA: Diagnosis not present

## 2023-08-11 DIAGNOSIS — R0602 Shortness of breath: Secondary | ICD-10-CM | POA: Diagnosis not present

## 2023-08-11 DIAGNOSIS — Z794 Long term (current) use of insulin: Secondary | ICD-10-CM | POA: Diagnosis not present

## 2023-08-11 DIAGNOSIS — Z7901 Long term (current) use of anticoagulants: Secondary | ICD-10-CM | POA: Diagnosis not present

## 2023-08-11 DIAGNOSIS — Z79899 Other long term (current) drug therapy: Secondary | ICD-10-CM | POA: Diagnosis not present

## 2023-08-11 DIAGNOSIS — R0789 Other chest pain: Secondary | ICD-10-CM | POA: Diagnosis not present

## 2023-08-11 DIAGNOSIS — Z881 Allergy status to other antibiotic agents status: Secondary | ICD-10-CM | POA: Diagnosis not present

## 2023-08-11 DIAGNOSIS — J449 Chronic obstructive pulmonary disease, unspecified: Secondary | ICD-10-CM | POA: Diagnosis not present

## 2023-08-11 DIAGNOSIS — Z888 Allergy status to other drugs, medicaments and biological substances status: Secondary | ICD-10-CM | POA: Diagnosis not present

## 2023-08-12 DIAGNOSIS — R0789 Other chest pain: Secondary | ICD-10-CM | POA: Diagnosis not present

## 2023-08-12 DIAGNOSIS — E1151 Type 2 diabetes mellitus with diabetic peripheral angiopathy without gangrene: Secondary | ICD-10-CM | POA: Diagnosis not present

## 2023-08-12 DIAGNOSIS — I340 Nonrheumatic mitral (valve) insufficiency: Secondary | ICD-10-CM | POA: Diagnosis not present

## 2023-08-12 DIAGNOSIS — E1165 Type 2 diabetes mellitus with hyperglycemia: Secondary | ICD-10-CM | POA: Diagnosis not present

## 2023-08-12 DIAGNOSIS — I509 Heart failure, unspecified: Secondary | ICD-10-CM | POA: Diagnosis not present

## 2023-08-12 DIAGNOSIS — I480 Paroxysmal atrial fibrillation: Secondary | ICD-10-CM

## 2023-08-12 DIAGNOSIS — I2510 Atherosclerotic heart disease of native coronary artery without angina pectoris: Secondary | ICD-10-CM | POA: Diagnosis not present

## 2023-08-12 DIAGNOSIS — I739 Peripheral vascular disease, unspecified: Secondary | ICD-10-CM | POA: Diagnosis not present

## 2023-08-12 DIAGNOSIS — R079 Chest pain, unspecified: Secondary | ICD-10-CM | POA: Diagnosis not present

## 2023-08-12 DIAGNOSIS — N183 Chronic kidney disease, stage 3 unspecified: Secondary | ICD-10-CM

## 2023-08-12 DIAGNOSIS — I214 Non-ST elevation (NSTEMI) myocardial infarction: Secondary | ICD-10-CM | POA: Diagnosis not present

## 2023-08-13 DIAGNOSIS — E1165 Type 2 diabetes mellitus with hyperglycemia: Secondary | ICD-10-CM | POA: Diagnosis not present

## 2023-08-13 DIAGNOSIS — R0789 Other chest pain: Secondary | ICD-10-CM | POA: Diagnosis not present

## 2023-08-13 DIAGNOSIS — E1151 Type 2 diabetes mellitus with diabetic peripheral angiopathy without gangrene: Secondary | ICD-10-CM | POA: Diagnosis not present

## 2023-08-15 DIAGNOSIS — M1990 Unspecified osteoarthritis, unspecified site: Secondary | ICD-10-CM | POA: Insufficient documentation

## 2023-08-16 ENCOUNTER — Ambulatory Visit: Admitting: Internal Medicine

## 2023-08-16 ENCOUNTER — Encounter: Payer: Self-pay | Admitting: Internal Medicine

## 2023-08-16 VITALS — BP 134/80 | HR 89 | Temp 98.7°F | Resp 18 | Ht 67.0 in | Wt 179.4 lb

## 2023-08-16 DIAGNOSIS — I25118 Atherosclerotic heart disease of native coronary artery with other forms of angina pectoris: Secondary | ICD-10-CM | POA: Diagnosis not present

## 2023-08-16 DIAGNOSIS — I255 Ischemic cardiomyopathy: Secondary | ICD-10-CM

## 2023-08-16 DIAGNOSIS — I5042 Chronic combined systolic (congestive) and diastolic (congestive) heart failure: Secondary | ICD-10-CM

## 2023-08-16 DIAGNOSIS — I480 Paroxysmal atrial fibrillation: Secondary | ICD-10-CM

## 2023-08-16 DIAGNOSIS — E1159 Type 2 diabetes mellitus with other circulatory complications: Secondary | ICD-10-CM

## 2023-08-16 MED ORDER — INSULIN GLARGINE 100 UNITS/ML SOLOSTAR PEN: PEN_INJECTOR | SUBCUTANEOUS | Status: DC

## 2023-08-16 NOTE — Assessment & Plan Note (Signed)
 Plan as below.

## 2023-08-16 NOTE — Assessment & Plan Note (Signed)
 The patient is going to see Dr. Linnell Richardson next week for followup.  He is currently under medical management of his CAD.  He has had a MI in the past with prior stenting.  They have now started him on isosorbide dinitrate which seems to be helping.  He may need a repeat stress test.  Continue on his coreg , crestor /zetia  and his ASA.

## 2023-08-16 NOTE — Assessment & Plan Note (Signed)
 His A1c was 10.6 on 06/30/2023.  He has been uptitrating his lantus .  He states his FSBS were running 110-160.  We will see him in 3 months for a repeat HgBa1c.

## 2023-08-16 NOTE — Assessment & Plan Note (Signed)
 His repeat ECHO was done and I compared this to his ECHO from last year.  His EF is similar.  He has no evidence of volume overload.  He remains on Entresto  at this time.  He is not on a diuretic.

## 2023-08-16 NOTE — Progress Notes (Signed)
 Office Visit  Subjective   Patient ID: ASHTAN STIPES   DOB: 08/30/1945   Age: 78 y.o.   MRN: [id number]   Chief Complaint Chief Complaint  Patient presents with   Acute Visit    Hospital Follow Up     History of Present Illness Mr. Trapasso is a 78 yo male who comes in today for a hospital followup where he was admitted to [facility] from 08/11/2023 until 08/13/2023 due to chest tightness and SOB with exertion.  The patient had sudden onset of SOB and chest tightness with sweating and feeling cold/clammy while he was doing house chores and climbing up and down.  He sat down to rest but could not catch his breath.  An EKG done in the ER showed no acute ST/T wave changes and his CXR showed no acute process.  He was admitted to the hospital where they trended his troponin which did go into the gray zone.  Cardiology was consulted and an ECHO was done which showed moderate to severely decreased LV systolic function with EF of 30-35%.  His LV chamber sized was mildly dilated.  There was wall motion abnormality noted with akinetic inferior and inferiorseptal segment and hypokinetic rest of segment.  Cardiology started him on isosorbide dinitrate 10mg  BID and followup with them for an outpatient stress test.  They also asked him to stop viagra use at home.  He had no further chest tightness or SOB.  He was noted to have some elevated glucoses but this was felt to be secondary to iv solumedrol that the ER gave him on initial evaluation.  He tells me that he goes to see Dr. Linnell Richardson in cardiology next week.  Today, he denies any chest pain, palpitations, SOB/DOE, nausea, vomiting, diaphoresis, or syncope. He states the isosorbide makes him a bit "swimmy headed".  The patient has a history of CAD, Atrial Fib and combined systolic and diastolic CHF/ischemic cardiomyopathy.  He did go see cardiology for preoperative clearance on 09/06/2022.  They noted at that time that his A. Fib was maintaining in sinus rhythm.  He is on Xarelto   20mg  every day and ASA 81mg  daily.  He has moderately severe CAD with history of MI in 1992 with PTCA of RCA.  He had an inferior wall MI in 2000 that was treated with PCI to LAD.  He also had an inferior MI in 2006 with DES to RCA and developed in-stent restenosis with repeat stenting in 2006.  He had a myocardial perfusion scan done on 09/2017 that showed an LVEF of 29%.  There was a large defect of severe severity present in the basal inferoseptal, basal inferior, mid inferoseptal, mid inferior, apical anterior, apical septal, apical inferior, apical lateral and apex location.  This was a high risk study and there were findings  consistent with prior myocardial infarction. It was a high risk study due to a very large old scar and severely reduced left ventricular systolic function. No reversible ischemia was seen. A LHC was performed 2019 that showed severe stenosis of proximal LAD that was treated with DES and RCA was found to be chronically occluded with left-to-right collaterals.  His last visit with cardiology was in 11/29/2022 where they did a preoperative workup.  He had a myocardial perfusion imaginging done on 10/10/2022 that showed prior infarction but no current ischemia.  He had an ECHO done on 09/23/2022 that showed EF 25 to 30%, severely decreased function, global hypokinesis, grade 1 DD, mildly elevated  PASP, mild MR, mild calcification aortic valve without stenosis .  See PMH for summary of cardiac history and status of revascularization. His CAD is controlled with therapy as summarized in the medication list and previous notes.  He has the following baseline symptoms: none.  He states he can go up 3-4 flight of stairs before he gets SOB.Aaron Aas He has the following modifiable risk factor(s): HTN, DM, hyperlipidemia, and sedentary lifestyle. Specifically denied complaint(s): chest pain, palpitations, orthopnea, edema, exertional dyspnea, and syncope.      Again, the patient has a history of combined chronic  systolic and diastolic CHF of years duration and presents for a regular status visit. The etiology of the CHF is secondary to ischemic cardiomyopathy related to his CAD as described above.  His CHF has been in a compensated state on medications as noted in the medication list. He has the following baseline symptoms: none. Specifically denied complaints: orthopnea, PND, edema, worsening orthopnea, worsening edema, and worsening exertional dyspnea.  A prior ECHO was done in 02/28/2022 that showed a LVEF 60-65%. Left ventricular ejection fraction by PLAX is 25 %. The left ventricle has severely decreased function. The left ventricle demonstrates global hypokinesis. The left ventricular internal cavity size was mildly dilated. Left ventricular diastolic parameters are consistent with Grade II diastolic dysfunction (pseudonormalization).  His right ventricular systolic function is mildly reduced. The right ventricular size is normal. There was normal pulmonary artery systolic pressure.  3. Left atrial size was mildly dilated.  The mitral valve is degenerative. Mild mitral valve regurgitation. No evidence of mitral stenosis.  Cardiology saw him on 06/21/2022 where they increased his dose of entresto  to 49/51mg  BID.  They discussed possible referral to EP for discussion of possible ICD.  When he saw cardiology on 09/06/2022 they wanted to repeat his ECHO to check his EF and go from there to see if he needs ICD placement.  He had an ECHO done on 09/23/2022 that showed his EF was 25-30%. The left ventricle has severely decreased function. The left ventricle demonstrates global hypokinesis. Left ventricular diastolic parameters are consistent with Grade I diastolic dysfunction (impaired relaxation). Elevated left ventricular end-diastolic pressure.  The right ventricular systolic function is normal. The right ventricular size is normal. There is mildly elevated pulmonary artery systolic pressure.  His left atrial size was severely  dilated.       Past Medical History Past Medical History:  Diagnosis Date   Acute low back pain 01/12/2015   Acute lower limb ischemia 07/11/2023   Acute pain of both shoulders 07/22/2022   AKI (acute kidney injury) (HCC) 05/24/2022   Arthritis    Atherosclerosis of native arteries of the extremities with intermittent claudication 08/13/2013   Atherosclerosis of native artery of extremity with intermittent claudication (HCC) 08/13/2013   IMO SNOMED Dx Update Oct 2024     Backache 02/08/2013   Benign prostatic hyperplasia with urinary frequency 02/23/2023   Bladder cancer (HCC)    resection x3   BMI 27.0-27.9,adult 02/23/2023   Cellulitis 07/17/2023   Chronic combined systolic (congestive) and diastolic (congestive) heart failure (HCC) 07/22/2022   Chronic left shoulder pain 05/24/2023   Chronic right shoulder pain 09/02/2022   Chronic systolic CHF (congestive heart failure) (HCC)    CKD stage 3b, GFR 30-44 ml/min (HCC) 07/03/2023   Coronary artery disease    status post DMI RX Taxus stent RCA 2006 with susequent Stent LAD and subsequent  stent thrombosis RCA unable to be opened 2006 -neg mv 10/2008,  10/16/17 ISR to pLAD with PTCA/DES, CTO of RCA with collaterals, EF 25%   Coronary artery disease involving native coronary artery of native heart with unstable angina pectoris (HCC)    Current moderate episode of major depressive disorder without prior episode (HCC) 03/28/2022   Diverticulitis 06/10/2022   Diverticulitis large intestine 05/11/2021   Diverticulitis of large intestine with abscess 05/24/2022   Diverticulitis of sigmoid colon 05/11/2021   DM2 (diabetes mellitus, type 2) (HCC) 02/09/2022   status post bilateral aortobifemoral bypass [id number] with recent fem to fembypass April  2011 per DR. Early     Dyssynergic defecation 03/21/2023   Essential hypertension 07/22/2022   Fatigue 03/13/2023   History of bladder cancer 02/23/2023   History of colonic diverticulitis  03/21/2023   Hypercholesterolemia 10/28/2022   HYPERLIPIDEMIA-MIXED 09/22/2008   Qualifier: Diagnosis of  By: Malon Seamen     Hypothyroidism 05/24/2022   Ischemic cardiomyopathy    ejection fraction of 40-45%   Lethargy 03/28/2022   Lumbar spinal stenosis    Myocardial infarction (HCC)    "I've had 4" (10/16/2017)   Need for prophylactic vaccination and inoculation against influenza 03/28/2022   NSVT (nonsustained ventricular tachycardia) (HCC)    PAD (peripheral artery disease) (HCC) 07/17/2023   PAF (paroxysmal atrial fibrillation) (HCC) 02/09/2022   Paroxysmal atrial fibrillation (HCC)    Peripheral artery disease (HCC) 07/09/2023   Poorly controlled T2 diabetes mellitus (HCC) 09/27/2022   Protein-calorie malnutrition, severe 05/29/2022   PVC's (premature ventricular contractions)    PVD 09/22/2008   Qualifier: Diagnosis of  By: Malon Seamen     PVD (peripheral vascular disease) (HCC) 02/23/2023   Rotator cuff arthropathy of right shoulder 09/27/2022   Sigmoid diverticulitis 06/30/2023   Status post coronary artery stent placement    Status post Hartmann's procedure (HCC) 03/21/2023   TOBACCO ABUSE 05/29/2009   Qualifier: Diagnosis of  By: Emma Hardy, RN, BSN, Levern Reader    Trigger ring finger of right hand 03/29/2023   Type 2 diabetes mellitus with other circulatory complications (HCC) 07/22/2022   Type II diabetes mellitus (HCC)    Unstable angina (HCC) 10/16/2017     Allergies Allergies  Allergen Reactions   Lisinopril  Swelling and Rash    Rash - face and tounge swell     Medications  Current Outpatient Medications:    amLODipine  (NORVASC ) 5 MG tablet, Take 1 tablet (5 mg total) by mouth daily., Disp: 90 tablet, Rfl: 3   aspirin  EC 81 MG tablet, Take 1 tablet (81 mg total) by mouth daily. Swallow whole., Disp: 90 tablet, Rfl: 3   carvedilol  (COREG ) 12.5 MG tablet, Take 1 tablet (12.5 mg total) by mouth 2 (two) times daily with a meal., Disp: 180 tablet, Rfl: 3    cetirizine  (ZYRTEC ) 10 MG tablet, Take 1 tablet (10 mg total) by mouth daily., Disp: 90 tablet, Rfl: 0   cyclobenzaprine  (FLEXERIL ) 10 MG tablet, Take 1 tablet (10 mg total) by mouth 3 (three) times daily as needed for muscle spasms., Disp: 15 tablet, Rfl: 0   ezetimibe  (ZETIA ) 10 MG tablet, Take 1 tablet (10 mg total) by mouth daily., Disp: 90 tablet, Rfl: 3   insulin  glargine (LANTUS ) 100 unit/mL SOPN, Start with 8 Units subcut daily.  Increase by 3 Units every 3 days until your FSBS are consistently less than 150 (Patient taking differently: Inject 15-17 Units into the skin 2 (two) times daily.), Disp: 15 mL, Rfl: 2   JARDIANCE  10 MG TABS tablet, Take 1 tablet (10  mg total) by mouth daily., Disp: 30 tablet, Rfl: 2   loperamide  (IMODIUM  A-D) 2 MG tablet, Take 2 mg by mouth 4 (four) times daily as needed for diarrhea or loose stools., Disp: , Rfl:    nitroGLYCERIN  (NITROSTAT ) 0.4 MG SL tablet, Place 0.4 mg under the tongue every 5 (five) minutes as needed for chest pain., Disp: , Rfl:    rivaroxaban  (XARELTO ) 20 MG TABS tablet, Take 1 tablet (20 mg total) by mouth daily with supper., Disp: 30 tablet, Rfl: 3   rosuvastatin  (CRESTOR ) 40 MG tablet, TAKE 1 TABLET(40 MG) BY MOUTH DAILY, Disp: 90 tablet, Rfl: 1   sacubitril -valsartan  (ENTRESTO ) 49-51 MG, Take 1 tablet by mouth 2 (two) times daily., Disp: 60 tablet, Rfl: 3   tamsulosin  (FLOMAX ) 0.4 MG CAPS capsule, Take 0.8 mg by mouth every evening., Disp: , Rfl:    traMADol  (ULTRAM ) 50 MG tablet, Take 1-2 tablets (50-100 mg total) by mouth every 6 (six) hours as needed for moderate pain (pain score 4-6)., Disp: 20 tablet, Rfl: 0   Review of Systems Review of Systems  Constitutional:  Negative for chills and fever.  Eyes:  Negative for blurred vision and double vision.  Respiratory:  Positive for cough. Negative for shortness of breath.   Cardiovascular:  Negative for chest pain, palpitations and leg swelling.  Gastrointestinal:  Positive for diarrhea.  Negative for abdominal pain, constipation, nausea and vomiting.  Genitourinary:  Negative for frequency.  Musculoskeletal:  Negative for myalgias.  Skin:  Negative for rash.  Neurological:  Positive for dizziness. Negative for weakness and headaches.  Endo/Heme/Allergies:  Negative for polydipsia.       Objective:    Vitals BP 134/80   Pulse 89   Temp 98.7 F (37.1 C)   Resp 18   Ht 5\' 7"  (1.702 m)   Wt 179 lb 6.4 oz (81.4 kg)   SpO2 97%   BMI 28.10 kg/m    Physical Examination Physical Exam Constitutional:      Appearance: Normal appearance. He is not ill-appearing.  Cardiovascular:     Rate and Rhythm: Normal rate and regular rhythm.     Pulses: Normal pulses.     Heart sounds: No murmur heard.    No friction rub. No gallop.  Pulmonary:     Effort: Pulmonary effort is normal. No respiratory distress.     Breath sounds: No wheezing, rhonchi or rales.  Abdominal:     General: Abdomen is flat. Bowel sounds are normal. There is no distension.     Palpations: Abdomen is soft.     Tenderness: There is no abdominal tenderness.  Musculoskeletal:     Right lower leg: No edema.     Left lower leg: No edema.  Skin:    General: Skin is warm and dry.     Findings: No rash.  Neurological:     General: No focal deficit present.     Mental Status: He is alert and oriented to person, place, and time.  Psychiatric:        Mood and Affect: Mood normal.        Behavior: Behavior normal.        Assessment & Plan:   Type 2 diabetes mellitus with other circulatory complications (HCC) His A1c was 10.6 on 06/30/2023.  He has been uptitrating his lantus .  He states his FSBS were running 110-160.  We will see him in 3 months for a repeat HgBa1c.  Paroxysmal atrial fibrillation (HCC) His A.  Fib is currently rate controlled.  He is on xarelto  20mg  daily for anticoagulation and he remains on an ASA.  Ischemic cardiomyopathy Plan as below.  Coronary artery disease The patient is  going to see Dr. Linnell Richardson next week for followup.  He is currently under medical management of his CAD.  He has had a MI in the past with prior stenting.  They have now started him on isosorbide dinitrate which seems to be helping.  He may need a repeat stress test.  Continue on his coreg , crestor /zetia  and his ASA.  Chronic combined systolic (congestive) and diastolic (congestive) heart failure (HCC) His repeat ECHO was done and I compared this to his ECHO from last year.  His EF is similar.  He has no evidence of volume overload.  He remains on Entresto  at this time.  He is not on a diuretic.    Return in about 2 years (around 08/15/2025).   Wayne Haines, MD

## 2023-08-16 NOTE — Assessment & Plan Note (Signed)
 His A. Fib is currently rate controlled.  He is on xarelto  20mg  daily for anticoagulation and he remains on an ASA.

## 2023-08-17 ENCOUNTER — Encounter: Payer: Self-pay | Admitting: Cardiology

## 2023-08-17 ENCOUNTER — Ambulatory Visit: Payer: Medicare Other | Admitting: Internal Medicine

## 2023-08-17 ENCOUNTER — Ambulatory Visit: Attending: Cardiology | Admitting: Cardiology

## 2023-08-17 VITALS — BP 132/68 | HR 90 | Ht 67.0 in | Wt 179.4 lb

## 2023-08-17 DIAGNOSIS — I25110 Atherosclerotic heart disease of native coronary artery with unstable angina pectoris: Secondary | ICD-10-CM | POA: Diagnosis not present

## 2023-08-17 DIAGNOSIS — D6859 Other primary thrombophilia: Secondary | ICD-10-CM

## 2023-08-17 DIAGNOSIS — I5020 Unspecified systolic (congestive) heart failure: Secondary | ICD-10-CM

## 2023-08-17 DIAGNOSIS — E782 Mixed hyperlipidemia: Secondary | ICD-10-CM

## 2023-08-17 DIAGNOSIS — I480 Paroxysmal atrial fibrillation: Secondary | ICD-10-CM

## 2023-08-17 DIAGNOSIS — E1159 Type 2 diabetes mellitus with other circulatory complications: Secondary | ICD-10-CM

## 2023-08-17 NOTE — Progress Notes (Signed)
 Cardiology Office Note:  .   Date:  08/17/2023  ID:  [name], DOB Sep 08, 1945, MRN [id number] PCP: Wayne Haines, MD  [facility] Providers Cardiologist:  Ralene Burger, MD    History of Present Illness: .   [name] is a 78 y.o. male with a past medical history of CAD s/p multiple interventions, ischemic cardiomyopathy, NSVT, PAF, PVCs, DM 2, PVD, PAD--follows with vascular, COPD, hypertension, history of bladder cancer, hyperlipidemia, tobacco abuse.  07/10/2023 peripheral vascular catheterization acute thrombosis of left mid SFA 07/09/2023 ABI abnormal 05/23/2023 echo EF 45 to 50%, positive RWMA, mild LVH 10/11/2022 MPI prior infarction, no current ischemia. 09/23/2022 echocardiogram EF 25 to 30%, severely decreased function, global hypokinesis, grade 1 DD, mildly elevated PASP, mild MR, mild calcification aortic valve without stenosis 02/28/2022 echocardiogram EF 60 to 65%, global hypokinesis, grade 2 DD, mild MR 02/28/2022 carotid ultrasound 1 to 39% stenosis bilaterally 10/16/2017 left heart cath CTO of RCA with left-to-right collaterals, severe stenosis proximal LAD, PTCA/DES to proximal LAD  Evaluated by Dr. Gordan Latina on 10/06/2022, he was evaluated for preoperative clearance for possible reverse colostomy and upcoming shoulder surgery.  A stress test was arranged and completed on 10/11/2022 revealing previous infarction no current ischemia.   Evaluated at [facility] 07/09/2023 to 07/11/2023 with complaints of claudication >> acute lower limb ischemia secondary to acute thrombosis of the mid SFA s/p mechanical thrombectomy of right SFA mechanical thrombectomy of right AT with balloon angioplasty of the right SFA.  He was then admitted to [facility] 08/11/2023 to 08/13/2023 secondary to chest pain and DOE.  A repeat echocardiogram was arranged revealing an EF of 30 to 35%, he was started on isosorbide dinitrate 10 mg twice daily and advised to follow-up for  outpatient evaluation.  He presents today after recent hospitalizations as outlined above.  He is doing okay since he was in the hospital, has not had further episodes of chest pain, is having some difficulty tolerating the isosorbide with main complaints of feeling " swimmy headed".  He also mentions that his son unexpectedly passed around the time of his hospitalizations, as well as a close friend and he is understandably been grieving there loss.He does have plans to go dancing tomorrow night.  We did discuss his recent hospitalization, underlying CAD and heart failure, will proceed with a cardiac PET evaluation.    ROS: Review of Systems  Constitutional: Negative.   HENT: Negative.    Eyes: Negative.   Respiratory:  Positive for shortness of breath.   Cardiovascular: Negative.   Gastrointestinal: Negative.   Genitourinary: Negative.   Musculoskeletal:  Positive for joint pain.  Skin: Negative.   Neurological:  Positive for dizziness.  Endo/Heme/Allergies: Negative.   Psychiatric/Behavioral: Negative.       Studies Reviewed: .        Cardiac Studies & Procedures   ______________________________________________________________________________________________ CARDIAC CATHETERIZATION  CARDIAC CATHETERIZATION 10/16/2017  Conclusion  Prox RCA lesion is 100% stenosed.  Mid RCA to Dist RCA lesion is 100% stenosed.  Ost LM to Mid LM lesion is 30% stenosed.  Ost LAD to Prox LAD lesion is 90% stenosed.  A drug-eluting stent was successfully placed using a STENT SYNERGY DES 3X28.  Post intervention, there is a 0% residual stenosis.  LV end diastolic pressure is normal.  1. Chronic occlusion RCA with distal vessel filling from left to right collaterals. 2. Severe stenosis proximal LAD in the old stented segment (bare metal stent was placed in  2000). 3. Successful PTCA/DES x  Proximal LAD 4. Normal filling pressures  Recommendations: Will continue ASA and Plavix  for one month  along with Xarelto . Restart Xarelto  in am. Will use ASA for only one month then will stop and continue Plavix  with Xarelto  for one year. Continue beta blocker and statin. Likely d/c home tomorrow am.  Findings Coronary Findings Diagnostic  Dominance: Right  Left Main Ost LM to Mid LM lesion is 30% stenosed.  Left Anterior Descending Vessel is large. Ost LAD to Prox LAD lesion is 90% stenosed. The lesion was previously treated using a bare metal stent over 2 years ago.  Right Coronary Artery Vessel is large. Prox RCA lesion is 100% stenosed. The lesion is chronically occluded. Mid RCA to Dist RCA lesion is 100% stenosed. The lesion is chronically occluded. The lesion was previously treated.  Right Posterior Atrioventricular Artery Collaterals RPAV filled by collaterals from 1st Sept.  Intervention  Ost LAD to Prox LAD lesion Stent Pre-stent angioplasty was performed using a BALLOON WOLVERINE 2.50X10. A drug-eluting stent was successfully placed using a STENT SYNERGY DES 3X28. Stent strut is well apposed. Stent overlaps previously placed stent. Post-stent angioplasty was performed using a BALLOON SAPPHIRE [place] 3.5X18. Post-Intervention Lesion Assessment The intervention was successful. Pre-interventional TIMI flow is 3. Post-intervention TIMI flow is 3. No complications occurred at this lesion. There is a 0% residual stenosis post intervention.   STRESS TESTS  MYOCARDIAL PERFUSION IMAGING 10/11/2022  Narrative   Findings are consistent with infarction and no ischemia.   No ST deviation was noted.   Left ventricular function is abnormal. Global function is moderately reduced. Nuclear stress EF: 36 %. The left ventricular ejection fraction is moderately decreased (30-44%). End diastolic cavity size is mildly enlarged.   Prior study available for comparison from 09/28/2017.   ECHOCARDIOGRAM  ECHOCARDIOGRAM COMPLETE 05/23/2023  Narrative ECHOCARDIOGRAM REPORT    Patient Name:    [name] Date of Exam: 05/23/2023 Medical Rec #:  [id number]     Height:       67.0 in Accession #:    [id number]    Weight:       174.4 lb Date of Birth:  [birth year]     BSA:          1.908 m Patient Age:    77 years      BP:           132/80 mmHg Patient Gender: M             HR:           78 bpm. Exam Location:  [facility]  Procedure: 2D Echo, Cardiac Doppler, Color Doppler and Intracardiac Opacification Agent  Indications:    Preoperative cardiovascular examination [Z01.810 (ICD-10-CM)]  History:        Patient has prior history of Echocardiogram examinations, most recent 09/23/2022. Cardiomyopathy and CHF, Previous Myocardial Infarction and CAD, COPD, Arrythmias:Atrial Fibrillation; Risk Factors:Diabetes, Dyslipidemia and Former Smoker.  Sonographer:    Erminia Hazel RDCS Referring Phys: [id number] ROBERT J KRASOWSKI  IMPRESSIONS   1. Left ventricular ejection fraction, by estimation, is 45 to 50%. The left ventricle has mildly decreased function. The left ventricle demonstrates regional wall motion abnormalities (see scoring diagram/findings for description). There is mild concentric left ventricular hypertrophy. Left ventricular diastolic parameters were normal. 2. Right ventricular systolic function is normal. The right ventricular size is normal. 3. The mitral valve is degenerative. No evidence of mitral valve regurgitation. No evidence of mitral stenosis.  4. The aortic valve was not well visualized. Aortic valve regurgitation is not visualized. No aortic stenosis is present. 5. Aortic Normal DTA.  FINDINGS Left Ventricle: Left ventricular ejection fraction, by estimation, is 45 to 50%. The left ventricle has mildly decreased function. The left ventricle demonstrates regional wall motion abnormalities. Definity  contrast agent was given IV to delineate the left ventricular endocardial borders. The left ventricular internal cavity size was normal in size. There is mild concentric  left ventricular hypertrophy. Left ventricular diastolic parameters were normal.   LV Wall Scoring: The inferior wall and basal inferoseptal segment are hypokinetic. The entire anterior wall, entire lateral wall, entire anterior septum, entire apex, and mid inferoseptal segment are normal.  Right Ventricle: The right ventricular size is normal. No increase in right ventricular wall thickness. Right ventricular systolic function is normal.  Left Atrium: Left atrial size was normal in size.  Right Atrium: Right atrial size was normal in size.  Pericardium: There is no evidence of pericardial effusion.  Mitral Valve: The mitral valve is degenerative in appearance. Mild mitral annular calcification. No evidence of mitral valve regurgitation. No evidence of mitral valve stenosis.  Tricuspid Valve: The tricuspid valve is normal in structure. Tricuspid valve regurgitation is not demonstrated. No evidence of tricuspid stenosis.  Aortic Valve: The aortic valve was not well visualized. There is mild aortic valve annular calcification. Aortic valve regurgitation is not visualized. No aortic stenosis is present.  Pulmonic Valve: The pulmonic valve was not well visualized. Pulmonic valve regurgitation is not visualized. No evidence of pulmonic stenosis.  Aorta: The aortic arch was not well visualized, the aortic root and ascending aorta are structurally normal, with no evidence of dilitation and Normal DTA.  Venous: The pulmonary veins were not well visualized. The inferior vena cava was not well visualized.  IAS/Shunts: No atrial level shunt detected by color flow Doppler.   LEFT VENTRICLE PLAX 2D LVIDd:         5.60 cm      Diastology LVIDs:         4.90 cm      LV e' medial:    4.13 cm/s LV PW:         0.90 cm      LV E/e' medial:  24.9 LV IVS:        1.10 cm      LV e' lateral:   7.29 cm/s LVOT diam:     1.90 cm      LV E/e' lateral: 14.1 LV SV:         39 LV SV Index:   21 LVOT Area:      2.84 cm  LV Volumes (MOD) LV vol d, MOD A2C: 117.0 ml LV vol d, MOD A4C: 178.0 ml LV vol s, MOD A2C: 64.0 ml LV vol s, MOD A4C: 88.9 ml LV SV MOD A2C:     53.0 ml LV SV MOD A4C:     178.0 ml LV SV MOD BP:      69.8 ml  RIGHT VENTRICLE            IVC RV Basal diam:  2.90 cm    IVC diam: 1.70 cm RV Mid diam:    2.90 cm RV S prime:     9.68 cm/s TAPSE (M-mode): 1.9 cm  LEFT ATRIUM             Index        RIGHT ATRIUM  Index LA diam:        4.00 cm 2.10 cm/m   RA Area:     18.00 cm LA Vol (A2C):   51.1 ml 26.78 ml/m  RA Volume:   51.40 ml  26.94 ml/m LA Vol (A4C):   54.5 ml 28.57 ml/m LA Biplane Vol: 55.3 ml 28.98 ml/m AORTIC VALVE LVOT Vmax:   72.50 cm/s LVOT Vmean:  46.467 cm/s LVOT VTI:    0.138 m  AORTA Ao Root diam: 3.10 cm Ao Desc diam: 2.10 cm  MITRAL VALVE MV Area (PHT): 4.31 cm     SHUNTS MV Decel Time: 176 msec     Systemic VTI:  0.14 m MV E velocity: 103.00 cm/s  Systemic Diam: 1.90 cm MV A velocity: 91.95 cm/s MV E/A ratio:  1.12  Zoe Hinds MD Electronically signed by Zoe Hinds MD Signature Date/Time: 05/23/2023/5:41:25 PM    Final    MONITORS  CARDIAC EVENT MONITOR 05/31/2016  Narrative Sinus rhythm. Non-sustained ventricular tachycardia, asymptomatic at time of events. Several 3 second pauses. Premature ventricular contractions  His monitor has been reviewed over the last month due to NSVT and he has been seen in EP clinic by Dr. Nunzio Belch (04/27/16 and 05/27/16). Continue beta blocker.       ______________________________________________________________________________________________      Risk Assessment/Calculations:    CHA2DS2-VASc Score = 6   This indicates a 9.7% annual risk of stroke. The patient's score is based upon: CHF History: 1 HTN History: 1 Diabetes History: 1 Stroke History: 0 Vascular Disease History: 1 Age Score: 2 Gender Score: 0            Physical Exam:   VS:  There were no vitals taken  for this visit.   Wt Readings from Last 3 Encounters:  08/16/23 179 lb 6.4 oz (81.4 kg)  07/17/23 175 lb 6 oz (79.5 kg)  07/09/23 180 lb (81.6 kg)    GEN: Well nourished, well developed in no acute distress NECK: No JVD; No carotid bruits CARDIAC: RRR, no murmurs, rubs, gallops RESPIRATORY:  Clear to auscultation without rales, wheezing or rhonchi  ABDOMEN: Soft, non-tender, non-distended EXTREMITIES:  No edema; No deformity   ASSESSMENT AND PLAN: .   Coronary artery disease -multiple interventions, most recent left heart cath CTO of RCA with left-to-right collaterals, severe stenosis proximal LAD, PTCA/DES to proximal LAD.  Recent hospitalization for chest pain, EF 30 to 35%, started on isosorbide 10 mg twice daily, continue aspirin  81 mg daily, continue Coreg  12.5 mg twice daily. Will arrange for PET CT.   HFrEF -NYHA class I-II, euvolemic.  Continue Coreg  12.5 mg twice daily, continue Jardiance  10 mg daily, continue Entresto  49-51 mg twice daily cannot be on MRA secondary to hyperkalemia.most recent EF 25 to 30%, did not want ICD.  PAF/hypercoagulable state -CHA2DS2-VASc score of 6, most recent EKG on 07/09/2023 indicated was were maintaining sinus rhythm, rate is controlled today, continue Xarelto  20 mg daily--no indication for dose reduction as most recent creatinine clearance was 51, continue Coreg  12.5 mg twice daily.  Hemoglobin 12.0, hematocrit 37.1 07/10/2023.  Dyslipidemia -most recent LDL was 76 on 03/13/2023, would prefer for this to be less than 70, managed by PCP.  DM2-most recent A1c was elevated greater than 10.6%.  Currently on insulin  and Jardiance .    Informed Consent   Shared Decision Making/Informed Consent The risks [chest pain, shortness of breath, cardiac arrhythmias, dizziness, blood pressure fluctuations, myocardial infarction, stroke/transient ischemic attack, nausea, vomiting, allergic reaction, radiation  exposure, metallic taste sensation and life-threatening  complications (estimated to be 1 in 10,000)], benefits (risk stratification, diagnosing coronary artery disease, treatment guidance) and alternatives of a cardiac PET stress test were discussed in detail with Mr. Mcelhenny and he agrees to proceed.       Dispo: PET CT, follow up with Dr. Gordan Latina in 3 months.   Signed, Terrance Ferretti, NP

## 2023-08-17 NOTE — Patient Instructions (Signed)
 Medication Instructions:  Your physician recommends that you continue on your current medications as directed. Please refer to the Current Medication list given to you today.  *If you need a refill on your cardiac medications before your next appointment, please call your pharmacy*  Lab Work: None If you have labs (blood work) drawn today and your tests are completely normal, you will receive your results only by: MyChart Message (if you have MyChart) OR A paper copy in the mail If you have any lab test that is abnormal or we need to change your treatment, we will call you to review the results.  Testing/Procedures: Cardiac PET Stress Test  Follow-Up: At [facility], you and your health needs are our priority.  As part of our continuing mission to provide you with exceptional heart care, our providers are all part of one team.  This team includes your primary Cardiologist (physician) and Advanced Practice Providers or APPs (Physician Assistants and Nurse Practitioners) who all work together to provide you with the care you need, when you need it.  Your next appointment:   3 month(s)  Provider:   Ralene Burger, MD    We recommend signing up for the patient portal called "MyChart".  Sign up information is provided on this After Visit Summary.  MyChart is used to connect with patients for Virtual Visits (Telemedicine).  Patients are able to view lab/test results, encounter notes, upcoming appointments, etc.  Non-urgent messages can be sent to your provider as well.   To learn more about what you can do with MyChart, go to [email or web address].   Other Instructions You can take Flonase 

## 2023-08-21 ENCOUNTER — Emergency Department (HOSPITAL_COMMUNITY)

## 2023-08-21 ENCOUNTER — Other Ambulatory Visit: Payer: Self-pay

## 2023-08-21 ENCOUNTER — Ambulatory Visit: Admitting: Internal Medicine

## 2023-08-21 ENCOUNTER — Emergency Department (HOSPITAL_COMMUNITY)
Admission: EM | Admit: 2023-08-21 | Discharge: 2023-08-21 | Disposition: A | Discharge status: Home / Self Care | Attending: Emergency Medicine | Admitting: Emergency Medicine

## 2023-08-21 DIAGNOSIS — Z7901 Long term (current) use of anticoagulants: Secondary | ICD-10-CM | POA: Insufficient documentation

## 2023-08-21 DIAGNOSIS — K5730 Diverticulosis of large intestine without perforation or abscess without bleeding: Secondary | ICD-10-CM | POA: Diagnosis not present

## 2023-08-21 DIAGNOSIS — R1032 Left lower quadrant pain: Secondary | ICD-10-CM | POA: Insufficient documentation

## 2023-08-21 DIAGNOSIS — R188 Other ascites: Secondary | ICD-10-CM | POA: Diagnosis not present

## 2023-08-21 DIAGNOSIS — I70213 Atherosclerosis of native arteries of extremities with intermittent claudication, bilateral legs: Secondary | ICD-10-CM

## 2023-08-21 DIAGNOSIS — I722 Aneurysm of renal artery: Secondary | ICD-10-CM | POA: Diagnosis not present

## 2023-08-21 DIAGNOSIS — K8020 Calculus of gallbladder without cholecystitis without obstruction: Secondary | ICD-10-CM | POA: Diagnosis not present

## 2023-08-21 DIAGNOSIS — Z95828 Presence of other vascular implants and grafts: Secondary | ICD-10-CM | POA: Insufficient documentation

## 2023-08-21 LAB — I-STAT CHEM 8, ED
BUN: 19 mg/dL (ref 8–23)
Calcium, Ion: 1.18 mmol/L (ref 1.15–1.40)
Chloride: 106 mmol/L (ref 98–111)
Creatinine, Ser: 1.2 mg/dL (ref 0.61–1.24)
Glucose, Bld: 190 mg/dL — ABNORMAL HIGH (ref 70–99)
HCT: 40 % (ref 39.0–52.0)
Hemoglobin: 13.6 g/dL (ref 13.0–17.0)
Potassium: 4.5 mmol/L (ref 3.5–5.1)
Sodium: 139 mmol/L (ref 135–145)
TCO2: 25 mmol/L (ref 22–32)

## 2023-08-21 LAB — CBC WITH DIFFERENTIAL/PLATELET
Abs Immature Granulocytes: 0.04 10*3/uL (ref 0.00–0.07)
Basophils Absolute: 0 10*3/uL (ref 0.0–0.1)
Basophils Relative: 0 %
Eosinophils Absolute: 0 10*3/uL (ref 0.0–0.5)
Eosinophils Relative: 0 %
HCT: 40 % (ref 39.0–52.0)
Hemoglobin: 12.3 g/dL — ABNORMAL LOW (ref 13.0–17.0)
Immature Granulocytes: 0 %
Lymphocytes Relative: 14 %
Lymphs Abs: 1.7 10*3/uL (ref 0.7–4.0)
MCH: 28.3 pg (ref 26.0–34.0)
MCHC: 30.8 g/dL (ref 30.0–36.0)
MCV: 92.2 fL (ref 80.0–100.0)
Monocytes Absolute: 1.1 10*3/uL — ABNORMAL HIGH (ref 0.1–1.0)
Monocytes Relative: 9 %
Neutro Abs: 8.9 10*3/uL — ABNORMAL HIGH (ref 1.7–7.7)
Neutrophils Relative %: 77 %
Platelets: 275 10*3/uL (ref 150–400)
RBC: 4.34 MIL/uL (ref 4.22–5.81)
RDW: 15.4 % (ref 11.5–15.5)
WBC: 11.8 10*3/uL — ABNORMAL HIGH (ref 4.0–10.5)
nRBC: 0 % (ref 0.0–0.2)

## 2023-08-21 MED ORDER — MORPHINE SULFATE (PF) 4 MG/ML IV SOLN
4.0000 mg | Freq: Once | INTRAVENOUS | Status: AC
  Administered 2023-08-21: 4 mg via INTRAVENOUS
  Filled 2023-08-21: qty 1

## 2023-08-21 MED ORDER — OXYCODONE-ACETAMINOPHEN 5-325 MG PO TABS: 1.0000 | ORAL_TABLET | Freq: Four times a day (QID) | ORAL | 0 refills | Status: DC | PRN

## 2023-08-21 MED ORDER — IOHEXOL 350 MG/ML SOLN
100.0000 mL | Freq: Once | INTRAVENOUS | Status: AC | PRN
  Administered 2023-08-21: 100 mL via INTRAVENOUS

## 2023-08-21 NOTE — Consult Note (Addendum)
 Hospital Consult    Reason for Consult:  acute left groin pain Requesting Physician:  ED MRN #:  [id number]  History of Present Illness: This is a 78 y.o. male with past medical history significant for CAD, type 2 diabetes mellitus, hypertension, PAD.  Surgical history significant for aortobifemoral bypass over 20 years ago.  He has since thrombosed the right limb and had a left to right femoral to femoral bypass.  He had an acute thrombosis of his right SFA in March and underwent percutaneous mechanical thrombectomy by Dr. Susi Eric on 07/10/2023.  Patient presents to the emergency department due to acute onset of left groin pain last night.  He denies doing anything strenuous and does not remember any inciting event that led to this.  He has no pain at rest however with any movement or light palpation he has severe pain.  He denies any rest pain, numbness, weakness of bilateral lower extremities.  He is aspirin , statin, Xarelto  daily.  He also underwent colostomy takedown last month.  Past Medical History:  Diagnosis Date   Acute low back pain 01/12/2015   Acute lower limb ischemia 07/11/2023   Acute pain of both shoulders 07/22/2022   AKI (acute kidney injury) (HCC) 05/24/2022   Arthritis    Atherosclerosis of native arteries of the extremities with intermittent claudication 08/13/2013   Atherosclerosis of native artery of extremity with intermittent claudication (HCC) 08/13/2013   IMO SNOMED Dx Update Oct 2024     Backache 02/08/2013   Benign prostatic hyperplasia with urinary frequency 02/23/2023   Bladder cancer (HCC)    resection x3   BMI 27.0-27.9,adult 02/23/2023   Cellulitis 07/17/2023   Chronic combined systolic (congestive) and diastolic (congestive) heart failure (HCC) 07/22/2022   Chronic left shoulder pain 05/24/2023   Chronic right shoulder pain 09/02/2022   Chronic systolic CHF (congestive heart failure) (HCC)    CKD stage 3b, GFR 30-44 ml/min (HCC) 07/03/2023   Coronary  artery disease    status post DMI RX Taxus stent RCA 2006 with susequent Stent LAD and subsequent  stent thrombosis RCA unable to be opened 2006 -neg mv 10/2008, 10/16/17 ISR to pLAD with PTCA/DES, CTO of RCA with collaterals, EF 25%   Coronary artery disease involving native coronary artery of native heart with unstable angina pectoris (HCC)    Current moderate episode of major depressive disorder without prior episode (HCC) 03/28/2022   Diverticulitis 06/10/2022   Diverticulitis large intestine 05/11/2021   Diverticulitis of large intestine with abscess 05/24/2022   Diverticulitis of sigmoid colon 05/11/2021   DM2 (diabetes mellitus, type 2) (HCC) 02/09/2022   status post bilateral aortobifemoral bypass [id number] with recent fem to fembypass April  2011 per DR. Early     Dyssynergic defecation 03/21/2023   Essential hypertension 07/22/2022   Fatigue 03/13/2023   History of bladder cancer 02/23/2023   History of colonic diverticulitis 03/21/2023   Hypercholesterolemia 10/28/2022   HYPERLIPIDEMIA-MIXED 09/22/2008   Qualifier: Diagnosis of  By: Jaramillo, Luz     Hypothyroidism 05/24/2022   Ischemic cardiomyopathy    ejection fraction of 40-45%   Lethargy 03/28/2022   Lumbar spinal stenosis    Myocardial infarction (HCC)    "I've had 4" (10/16/2017)   Need for prophylactic vaccination and inoculation against influenza 03/28/2022   NSVT (nonsustained ventricular tachycardia) (HCC)    PAD (peripheral artery disease) (HCC) 07/17/2023   PAF (paroxysmal atrial fibrillation) (HCC) 02/09/2022   Paroxysmal atrial fibrillation (HCC)    Peripheral artery disease (HCC) 07/09/2023  Poorly controlled T2 diabetes mellitus (HCC) 09/27/2022   Protein-calorie malnutrition, severe 05/29/2022   PVC's (premature ventricular contractions)    PVD 09/22/2008   Qualifier: Diagnosis of  By: Malon Seamen     PVD (peripheral vascular disease) (HCC) 02/23/2023   Rotator cuff arthropathy of right shoulder  09/27/2022   Sigmoid diverticulitis 06/30/2023   Status post coronary artery stent placement    Status post Hartmann's procedure (HCC) 03/21/2023   TOBACCO ABUSE 05/29/2009   Qualifier: Diagnosis of  By: Emma Hardy, RN, BSN, Levern Reader    Trigger ring finger of right hand 03/29/2023   Type 2 diabetes mellitus with other circulatory complications (HCC) 07/22/2022   Type II diabetes mellitus (HCC)    Unstable angina (HCC) 10/16/2017    Past Surgical History:  Procedure Laterality Date   ABDOMINAL AORTOGRAM W/LOWER EXTREMITY Right 07/10/2023   Procedure: ABDOMINAL AORTOGRAM W/LOWER EXTREMITY;  Surgeon: Philipp Brawn, MD;  Location: [facility] INVASIVE CV LAB;  Service: Cardiovascular;  Laterality: Right;   AORTA - BILATERAL FEMORAL ARTERY BYPASS GRAFT Bilateral 1992   APPENDECTOMY     BACK SURGERY     COLECTOMY WITH COLOSTOMY CREATION/HARTMANN PROCEDURE N/A 05/26/2022   Procedure: COLECTOMY WITH COLOSTOMY CREATION/HARTMANN PROCEDURE;  Surgeon: Anda Bamberg, MD;  Location: [facility] OR;  Service: General;  Laterality: N/A;   CORONARY ANGIOPLASTY WITH STENT PLACEMENT     taxus stent  placed into his right coronary artery; stent placed to the LAD.     CORONARY STENT INTERVENTION N/A 10/16/2017   Procedure: CORONARY STENT INTERVENTION;  Surgeon: Odie Benne, MD;  Location: [facility] INVASIVE CV LAB;  Service: Cardiovascular;  Laterality: N/A;   FEMORAL-FEMORAL BYPASS GRAFT  07/2009   LAPAROTOMY N/A 05/26/2022   Procedure: EXPLORATORY LAPAROTOMY;  Surgeon: Anda Bamberg, MD;  Location: [facility] OR;  Service: General;  Laterality: N/A;   LOWER EXTREMITY INTERVENTION Right 07/10/2023   Procedure: LOWER EXTREMITY INTERVENTION;  Surgeon: Philipp Brawn, MD;  Location: [facility] INVASIVE CV LAB;  Service: Cardiovascular;  Laterality: Right;   LUMBAR MICRODISCECTOMY  12/31/08   LYSIS OF ADHESION N/A 06/30/2023   Procedure: LAPAROSCOPIC LYSIS OF ADHESIONS;  Surgeon: Candyce Champagne, MD;  Location: [facility] ORS;  Service: General;   Laterality: N/A;   PERIPHERAL VASCULAR THROMBECTOMY Right 07/10/2023   Procedure: PERIPHERAL VASCULAR THROMBECTOMY;  Surgeon: Philipp Brawn, MD;  Location: [facility] INVASIVE CV LAB;  Service: Cardiovascular;  Laterality: Right;   PROCTOSCOPY N/A 06/30/2023   Procedure: Flexible sigmoidoscopy;  Surgeon: Candyce Champagne, MD;  Location: [facility] ORS;  Service: General;  Laterality: N/A;   RIGHT/LEFT HEART CATH AND CORONARY ANGIOGRAPHY N/A 10/16/2017   Procedure: RIGHT/LEFT HEART CATH AND CORONARY ANGIOGRAPHY;  Surgeon: Odie Benne, MD;  Location: [facility] INVASIVE CV LAB;  Service: Cardiovascular;  Laterality: N/A;   SHOULDER OPEN ROTATOR CUFF REPAIR Left    XI ROBOTIC ASSISTED COLOSTOMY TAKEDOWN N/A 06/30/2023   Procedure: XI ROBOTIC ASSISTED OSTOMY TAKEDOWN, PARTIAL RECTOSIGMOID RESECTION, BILATERAL TAP BLOCK;  Surgeon: Candyce Champagne, MD;  Location: [facility] ORS;  Service: General;  Laterality: N/A;    Allergies  Allergen Reactions   Lisinopril  Swelling and Rash    Rash - face and tounge swell    Prior to Admission medications   Medication Sig Start Date End Date Taking? Authorizing Provider  amLODipine  (NORVASC ) 5 MG tablet Take 1 tablet (5 mg total) by mouth daily. 09/21/22   Krasowski, Robert J, MD  aspirin  EC 81 MG tablet Take 1 tablet (81 mg total) by mouth  daily. Swallow whole. 05/10/21   Odie Benne, MD  carvedilol  (COREG ) 12.5 MG tablet Take 1 tablet (12.5 mg total) by mouth 2 (two) times daily with a meal. 01/24/23   Manfred Seed, MD  cetirizine  (ZYRTEC ) 10 MG tablet Take 1 tablet (10 mg total) by mouth daily. 02/09/23   Wayne Haines, MD  cyclobenzaprine  (FLEXERIL ) 10 MG tablet Take 1 tablet (10 mg total) by mouth 3 (three) times daily as needed for muscle spasms. 05/24/23   Wayne Haines, MD  ezetimibe  (ZETIA ) 10 MG tablet Take 1 tablet (10 mg total) by mouth daily. 03/01/23 02/24/24  Krasowski, Robert J, MD  insulin  glargine (LANTUS ) 100 unit/mL SOPN 35 units subcut daily 08/16/23    Van Eyk, Jason, MD  isosorbide dinitrate (ISORDIL) 10 MG tablet Take 10 mg by mouth 2 (two) times daily. 08/13/23   [provider]  JARDIANCE  10 MG TABS tablet Take 1 tablet (10 mg total) by mouth daily. 06/05/23   Wayne Haines, MD  loperamide  (IMODIUM  A-D) 2 MG tablet Take 2 mg by mouth 4 (four) times daily as needed for diarrhea or loose stools.    [provider]  nitroGLYCERIN  (NITROSTAT ) 0.4 MG SL tablet Place 0.4 mg under the tongue every 5 (five) minutes as needed for chest pain.    [provider]  rivaroxaban  (XARELTO ) 20 MG TABS tablet Take 1 tablet (20 mg total) by mouth daily with supper. 12/23/22   Wayne Haines, MD  rosuvastatin  (CRESTOR ) 40 MG tablet TAKE 1 TABLET(40 MG) BY MOUTH DAILY 07/31/23   Mitcheal Amy, Reymundo Caulk, MD  sacubitril -valsartan  (ENTRESTO ) 49-51 MG Take 1 tablet by mouth 2 (two) times daily. 06/30/22   Gerald Kitty., NP  tamsulosin  (FLOMAX ) 0.4 MG CAPS capsule Take 0.8 mg by mouth every evening. 03/31/23   [provider]  traMADol  (ULTRAM ) 50 MG tablet Take 1-2 tablets (50-100 mg total) by mouth every 6 (six) hours as needed for moderate pain (pain score 4-6). 07/03/23   Candyce Champagne, MD    Social History   Socioeconomic History   Marital status: Divorced    Spouse name: Not on file   Number of children: 1   Years of education: Not on file   Highest education level: Not on file  Occupational History   Occupation: retired    Associate Professor: RETIRED    Comment: truck driver  Tobacco Use   Smoking status: Former    Current packs/day: 0.00    Average packs/day: 0.5 packs/day for 56.0 years (28.0 ttl pk-yrs)    Types: Cigars, Cigarettes    Start date: 1966    Quit date: 2022    Years since quitting: 3.3   Smokeless tobacco: Former    Types: Engineer, drilling   Vaping status: Never Used  Substance and Sexual Activity   Alcohol use: Yes    Comment: Rarely 1 beer   Drug use: Never   Sexual activity: Not Currently  Other Topics  Concern   Not on file  Social History Narrative   Lives with girlfriend in [place].     Social Drivers of Corporate investment banker Strain: Not on file  Food Insecurity: No Food Insecurity (07/10/2023)   Hunger Vital Sign    Worried About Running Out of Food in the Last Year: Never true    Ran Out of Food in the Last Year: Never true  Transportation Needs: No Transportation Needs (07/10/2023)   PRAPARE -  Administrator, Civil Service (Medical): No    Lack of Transportation (Non-Medical): No  Physical Activity: Not on file  Stress: Not on file  Social Connections: Socially Integrated (07/10/2023)   Social Connection and Isolation Panel [NHANES]    Frequency of Communication with Friends and Family: Twice a week    Frequency of Social Gatherings with Friends and Family: Twice a week    Attends Religious Services: 1 to 4 times per year    Active Member of [employer] or Organizations: Yes    Attends Banker Meetings: 1 to 4 times per year    Marital Status: Married  Catering manager Violence: Not At Risk (07/10/2023)   Humiliation, Afraid, Rape, and Kick questionnaire    Fear of Current or Ex-Partner: No    Emotionally Abused: No    Physically Abused: No    Sexually Abused: No     Family History  Problem Relation Age of Onset   Coronary artery disease Unknown    Diabetes Unknown    Cardiomyopathy Mother    Heart disease Mother    Hyperlipidemia Mother    Coronary artery disease Father    Stroke Father    Diabetes Father    Heart disease Father        before age 5   Hyperlipidemia Father    Heart attack Father    Diabetes Sister    Heart disease Sister        before age 15   Hyperlipidemia Sister    Heart attack Sister     ROS: Otherwise negative unless mentioned in HPI  Physical Examination  Vitals:   08/21/23 1026  BP: 102/67  Pulse: (!) 112  Resp: (!) 22  Temp: 97.6 F (36.4 C)  SpO2: 96%   There is no height or weight on file to  calculate BMI.  General:  WDWN in NAD Gait: Not observed HENT: WNL, normocephalic Pulmonary: normal non-labored breathing, without Rales, rhonchi,  wheezing Cardiac: regular Abdomen:  soft, NT/ND, no masses Skin: without rashes Vascular Exam/Pulses: Palpable femoral femoral bypass pulse; palpable right PT pulse; palpable left DP pulse Extremities: Examination of left groin limited due to severe pain with light palpation Musculoskeletal: no muscle wasting or atrophy  Neurologic: A&O X 3;  No focal weakness or paresthesias are detected; speech is fluent/normal Psychiatric:  The pt has normal affect. Lymph:  Unremarkable  CBC    Component Value Date/Time   WBC 6.7 07/11/2023 0416   RBC 3.79 (L) 07/11/2023 0416   HGB 10.5 (L) 07/11/2023 0416   HGB 14.1 03/13/2023 1003   HCT 33.2 (L) 07/11/2023 0416   HCT 44.6 03/13/2023 1003   PLT 305 07/11/2023 0416   PLT 208 03/13/2023 1003   MCV 87.6 07/11/2023 0416   MCV 86 03/13/2023 1003   MCH 27.7 07/11/2023 0416   MCHC 31.6 07/11/2023 0416   RDW 15.3 07/11/2023 0416   RDW 13.9 03/13/2023 1003   LYMPHSABS 1.8 07/09/2023 1432   LYMPHSABS 1.5 03/13/2023 1003   MONOABS 0.5 07/09/2023 1432   EOSABS 0.1 07/09/2023 1432   EOSABS 0.1 03/13/2023 1003   BASOSABS 0.0 07/09/2023 1432   BASOSABS 0.0 03/13/2023 1003    BMET    Component Value Date/Time   NA 139 07/10/2023 0208   NA 140 03/13/2023 1003   K 4.1 07/10/2023 0208   CL 108 07/10/2023 0208   CO2 21 (L) 07/10/2023 0208   GLUCOSE 129 (H) 07/10/2023 8295  BUN 30 (H) 07/10/2023 0208   BUN 13 03/13/2023 1003   CREATININE 1.36 (H) 07/10/2023 0208   CREATININE 1.20 09/10/2013 0513   CALCIUM  9.0 07/10/2023 0208   GFRNONAA 53 (L) 07/10/2023 0208   GFRAA 82 02/09/2018 0721    COAGS: Lab Results  Component Value Date   INR 1.6 (H) 07/09/2023   INR 0.93 07/19/2010   INR 1.04 07/20/2009     Non-Invasive Vascular Imaging:   CTA pending    ASSESSMENT/PLAN: This is a 78 y.o.  male with acute onset left groin pain yesterday evening  [name] is a 78 year old male with history of aortobifemoral bypass subsequently having left to right femorofemoral bypass due to right limb thrombosis.  He recently underwent percutaneous mechanical thrombectomy of right SFA in March via left groin stick.  He reports sudden onset of pain in his left groin last night without any inciting event.  Bilateral lower extremities are well-perfused on exam with palpable pedal pulses.  Exam of the left groin is limited due to severe pain with light palpation.  We have ordered a CTA aorta with runoff to further evaluate.  On-call vascular surgeon Dr. Vikki Graves will evaluate the patient later today and provide further treatment plans.   Cordie Deters PA-C [facility] [phone]   I have independently interviewed and examined patient and agree with PA assessment and plan above. Formal plan pending CTA.   Clarissia Mckeen C. Vikki Graves, MD [facility] [place] Office: [phone] Pager: [phone]  Addendum: CTA reviewed and discussed with patient and his family.  I do not see any pseudoaneurysm or drainable fluid collections.  There is stranding around the graft suggestive of inflammation but unknown if this is inflammatory, infectious or secondary to low-grade trauma.  Would not recommend any intervention of the femorofemoral bypass graft at this time.  If patient requires admission for pain I would recommend infectious workup.  He does have follow-up with Dr. Susi Eric later this week if he is to discharge home.   Angela Kell, MD

## 2023-08-21 NOTE — Discharge Instructions (Addendum)
 You are seen in the emergency department today for concerns of postoperative pain.  Your labs and imaging were thankfully reassuring without any obvious findings to explain her current symptoms.  I would continue to manage your symptoms at home with over-the-counter medications for pain as needed.  I did send a short supply of stronger pain medication although this can cause constipation so please ensure you are staying hydrated and eating fiber.  Please follow-up with your vascular surgeon as planned on Friday.  For any concerns of worsening symptoms, please return to the emergency department.

## 2023-08-21 NOTE — ED Notes (Signed)
 Patient Alert and oriented to baseline. Stable and ambulatory to baseline. Patient verbalized understanding of the discharge instructions.  Patient belongings were taken by the patient.

## 2023-08-21 NOTE — ED Provider Notes (Signed)
 Accepted handoff at shift change from [place], PA-C. Please see prior provider note for more detail.   Briefly: Patient is 78 y.o.   DDX: concern for post-op infection, sepsis, abdominal mass, thrombus  Plan: Disposition per recommendations from vascular surgery and CT imaging results.   Physical Exam  BP 123/66   Pulse (!) 103   Temp 99 F (37.2 C) (Axillary)   Resp 18   SpO2 98%   Physical Exam Vitals reviewed.  Constitutional:      Appearance: Normal appearance.  HENT:     Mouth/Throat:     Mouth: Mucous membranes are moist.  Cardiovascular:     Rate and Rhythm: Normal rate.  Pulmonary:     Effort: Pulmonary effort is normal.  Musculoskeletal:     Comments: Tender left groin incision area to palpation, area is well-healed, no bruising no swelling.  Skin:    General: Skin is warm.  Neurological:     General: No focal deficit present.     Mental Status: He is alert.     Procedures  Procedures  ED Course / MDM    Medical Decision Making Amount and/or Complexity of Data Reviewed Labs: ordered.   Patient CT angio imaging shows no acute findings.  There are some changes to suggest possible inflammatory or infectious source although unclear.  No fluid collection or drainable abscess.  I reached out to Dr. Vikki Graves with vascular surgery who advised that beyond need for possible pain control, there would be no interventions that vascular surgery will be performing at this time.  He discussed these recommendations with family and they are preferring to be discharged home.  He did advise adding on a CBC for assessment of significant elevation in white blood count.  Patient CBC is unremarkable with only mild leukocytosis seen but not greater than 15,000.  Hemoglobin is stable with prior baseline hemoglobin.  Patient's i-STAT hemoglobin level of 13.6 is fairly consistent with his baseline levels checked on CBCs.  I do not feel this was in a likely accurate read.  His hemoglobin  level on CBC at 12.3 is more consistent with his baseline.  No obvious or significant drop in hemoglobin levels based on trends in the last month.  I discussed patient's findings with the family once again to clarify current plan.  They agree with plans for discharge home and follow-up with vascular surgery on Friday.  Given that pain is only present with movement, I suspect this is more likely to be an MSK source of pain then to be a surgical complication however did advise that if any signs of infection were to develop such as fever, chills, body aches, or any concern for drainage at the incision sites, patient should be brought back to the emergency department immediately.  Patient and family are agreeable with this plan verbalized understanding return precautions.  Patient discharged home in stable condition.       Totiana Everson A, PA-C 08/21/23 1842    Tegeler, Marine Sia, MD 08/22/23 [phone]

## 2023-08-21 NOTE — ED Provider Notes (Signed)
 [place] [place] EMERGENCY DEPARTMENT AT [facility] Provider Note   CSN: [id number] Arrival date & time: 08/21/23  1024     History  Chief Complaint  Patient presents with   Post-op Problem    [name] is a 78 y.o. male.  Patient complains of pain in his left groin.  Patient reports that he had a femoral bypass performed by Dr. Susi Eric 2 weeks ago.  Record shows patient had a left to right femoral bypass with thrombectomy.  Patient states he is having persistent pain in the left groin.  Patient has pain when he walks or when he moves.  He reports pain in the area when his pants push down on the area.  Patient has recently had an admission to the hospital for elevated troponins.  Patient also had a surgical repair of an ostomy done by general surgery for 2.  The history is provided by the patient and a relative. No language interpreter was used.       Home Medications Prior to Admission medications   Medication Sig Start Date End Date Taking? Authorizing Provider  amLODipine  (NORVASC ) 5 MG tablet Take 1 tablet (5 mg total) by mouth daily. 09/21/22   Krasowski, Robert J, MD  aspirin  EC 81 MG tablet Take 1 tablet (81 mg total) by mouth daily. Swallow whole. 05/10/21   Odie Benne, MD  carvedilol  (COREG ) 12.5 MG tablet Take 1 tablet (12.5 mg total) by mouth 2 (two) times daily with a meal. 01/24/23   Manfred Seed, MD  cetirizine  (ZYRTEC ) 10 MG tablet Take 1 tablet (10 mg total) by mouth daily. 02/09/23   Wayne Haines, MD  cyclobenzaprine  (FLEXERIL ) 10 MG tablet Take 1 tablet (10 mg total) by mouth 3 (three) times daily as needed for muscle spasms. 05/24/23   Wayne Haines, MD  ezetimibe  (ZETIA ) 10 MG tablet Take 1 tablet (10 mg total) by mouth daily. 03/01/23 02/24/24  Krasowski, Robert J, MD  insulin  glargine (LANTUS ) 100 unit/mL SOPN 35 units subcut daily 08/16/23   Van Eyk, Jason, MD  isosorbide dinitrate (ISORDIL) 10 MG tablet Take 10 mg by mouth 2 (two) times  daily. 08/13/23   [provider]  JARDIANCE  10 MG TABS tablet Take 1 tablet (10 mg total) by mouth daily. 06/05/23   Wayne Haines, MD  loperamide  (IMODIUM  A-D) 2 MG tablet Take 2 mg by mouth 4 (four) times daily as needed for diarrhea or loose stools.    [provider]  nitroGLYCERIN  (NITROSTAT ) 0.4 MG SL tablet Place 0.4 mg under the tongue every 5 (five) minutes as needed for chest pain.    [provider]  rivaroxaban  (XARELTO ) 20 MG TABS tablet Take 1 tablet (20 mg total) by mouth daily with supper. 12/23/22   Wayne Haines, MD  rosuvastatin  (CRESTOR ) 40 MG tablet TAKE 1 TABLET(40 MG) BY MOUTH DAILY 07/31/23   Wayne Haines, MD  sacubitril -valsartan  (ENTRESTO ) 49-51 MG Take 1 tablet by mouth 2 (two) times daily. 06/30/22   Gerald Kitty., NP  tamsulosin  (FLOMAX ) 0.4 MG CAPS capsule Take 0.8 mg by mouth every evening. 03/31/23   [provider]  traMADol  (ULTRAM ) 50 MG tablet Take 1-2 tablets (50-100 mg total) by mouth every 6 (six) hours as needed for moderate pain (pain score 4-6). 07/03/23   Candyce Champagne, MD      Allergies    Lisinopril     Review of Systems   Review of Systems  All other systems reviewed and are negative.   Physical Exam Updated Vital Signs BP 102/67 (BP Location: Left Arm)   Pulse (!) 112   Temp 97.6 F (36.4 C)   Resp (!) 22   SpO2 96%  Physical Exam Vitals reviewed.  Constitutional:      Appearance: Normal appearance.  HENT:     Mouth/Throat:     Mouth: Mucous membranes are moist.  Cardiovascular:     Rate and Rhythm: Normal rate.  Pulmonary:     Effort: Pulmonary effort is normal.  Musculoskeletal:     Comments: Tender left groin incision area to palpation, area is well-healed, no bruising no swelling.  Skin:    General: Skin is warm.  Neurological:     General: No focal deficit present.     Mental Status: He is alert.     ED Results / Procedures / Treatments   Labs (all labs ordered are listed, but  only abnormal results are displayed) Labs Reviewed - No data to display  EKG None  Radiology No results found.  Procedures Procedures    Medications Ordered in ED Medications - No data to display  ED Course/ Medical Decision Making/ A&P                                 Medical Decision Making Patient complains of pain in left groin.  Patient reports he had a thrombectomy done by vascular surgery.  Amount and/or Complexity of Data Reviewed Independent Historian:     Details: Patient is here with his daughter who is supportive Discussion of management or test interpretation with external provider(s): I spoke to the vascular surgeon Dr. Vikki Graves.  Vascular surgical PA will see the patient here for evaluation.  Risk Risk Details: Vascular surgery evaluated pt and will obtain Ct.   PT's care turned over to oncoming PA.            Final Clinical Impression(s) / ED Diagnoses Final diagnoses:  Groin pain, left    Rx / DC Orders ED Discharge Orders     None         Sandi Crosby, PA-C 08/21/23 1502    Arvilla Birmingham, MD 08/21/23 [phone]

## 2023-08-21 NOTE — ED Triage Notes (Signed)
 Patient states two weeks ago he had a procedure to correct a blockage in his LLE where they entered through his left groin. Last night began having pain to insertion site. Denies swelling. States pain is worse with movement but when he is not moving the pain is only a burning sensation.

## 2023-08-23 DIAGNOSIS — Z95828 Presence of other vascular implants and grafts: Secondary | ICD-10-CM | POA: Diagnosis not present

## 2023-08-25 ENCOUNTER — Emergency Department (HOSPITAL_COMMUNITY)
Admission: RE | Admit: 2023-08-25 | Discharge: 2023-08-25 | Disposition: A | Discharge status: Home / Self Care | Source: Ambulatory Visit | Attending: Vascular Surgery

## 2023-08-25 ENCOUNTER — Inpatient Hospital Stay (HOSPITAL_COMMUNITY)
Admission: AD | Admit: 2023-08-25 | Discharge: 2023-09-01 | DRG: 253 | Disposition: A | Discharge status: Home Health | Source: Ambulatory Visit | Attending: Vascular Surgery | Admitting: Vascular Surgery

## 2023-08-25 ENCOUNTER — Encounter (HOSPITAL_COMMUNITY): Payer: Self-pay

## 2023-08-25 ENCOUNTER — Ambulatory Visit: Attending: Vascular Surgery | Admitting: Physician Assistant

## 2023-08-25 ENCOUNTER — Other Ambulatory Visit: Payer: Self-pay | Admitting: Physician Assistant

## 2023-08-25 ENCOUNTER — Emergency Department (HOSPITAL_BASED_OUTPATIENT_CLINIC_OR_DEPARTMENT_OTHER)
Admission: RE | Admit: 2023-08-25 | Discharge: 2023-08-25 | Disposition: A | Discharge status: Home / Self Care | Source: Ambulatory Visit | Attending: Vascular Surgery | Admitting: Vascular Surgery

## 2023-08-25 VITALS — BP 143/71 | HR 100 | Temp 98.4°F | Resp 93 | Ht 67.0 in | Wt 176.5 lb

## 2023-08-25 DIAGNOSIS — I70222 Atherosclerosis of native arteries of extremities with rest pain, left leg: Secondary | ICD-10-CM | POA: Diagnosis not present

## 2023-08-25 DIAGNOSIS — E1151 Type 2 diabetes mellitus with diabetic peripheral angiopathy without gangrene: Secondary | ICD-10-CM | POA: Diagnosis not present

## 2023-08-25 DIAGNOSIS — I10 Essential (primary) hypertension: Secondary | ICD-10-CM | POA: Diagnosis not present

## 2023-08-25 DIAGNOSIS — I255 Ischemic cardiomyopathy: Secondary | ICD-10-CM | POA: Diagnosis present

## 2023-08-25 DIAGNOSIS — I70213 Atherosclerosis of native arteries of extremities with intermittent claudication, bilateral legs: Secondary | ICD-10-CM | POA: Diagnosis not present

## 2023-08-25 DIAGNOSIS — Y832 Surgical operation with anastomosis, bypass or graft as the cause of abnormal reaction of the patient, or of later complication, without mention of misadventure at the time of the procedure: Secondary | ICD-10-CM | POA: Diagnosis present

## 2023-08-25 DIAGNOSIS — Z7982 Long term (current) use of aspirin: Secondary | ICD-10-CM

## 2023-08-25 DIAGNOSIS — I5042 Chronic combined systolic (congestive) and diastolic (congestive) heart failure: Secondary | ICD-10-CM | POA: Diagnosis not present

## 2023-08-25 DIAGNOSIS — I2510 Atherosclerotic heart disease of native coronary artery without angina pectoris: Secondary | ICD-10-CM | POA: Diagnosis not present

## 2023-08-25 DIAGNOSIS — K219 Gastro-esophageal reflux disease without esophagitis: Secondary | ICD-10-CM

## 2023-08-25 DIAGNOSIS — T827XXD Infection and inflammatory reaction due to other cardiac and vascular devices, implants and grafts, subsequent encounter: Secondary | ICD-10-CM | POA: Diagnosis not present

## 2023-08-25 DIAGNOSIS — I70322 Atherosclerosis of unspecified type of bypass graft(s) of the extremities with rest pain, left leg: Secondary | ICD-10-CM | POA: Diagnosis not present

## 2023-08-25 DIAGNOSIS — M25512 Pain in left shoulder: Secondary | ICD-10-CM | POA: Diagnosis present

## 2023-08-25 DIAGNOSIS — L089 Local infection of the skin and subcutaneous tissue, unspecified: Secondary | ICD-10-CM | POA: Diagnosis not present

## 2023-08-25 DIAGNOSIS — Z7984 Long term (current) use of oral hypoglycemic drugs: Secondary | ICD-10-CM | POA: Diagnosis not present

## 2023-08-25 DIAGNOSIS — E7800 Pure hypercholesterolemia, unspecified: Secondary | ICD-10-CM | POA: Diagnosis present

## 2023-08-25 DIAGNOSIS — F4310 Post-traumatic stress disorder, unspecified: Secondary | ICD-10-CM | POA: Diagnosis present

## 2023-08-25 DIAGNOSIS — I9581 Postprocedural hypotension: Secondary | ICD-10-CM | POA: Diagnosis not present

## 2023-08-25 DIAGNOSIS — E1165 Type 2 diabetes mellitus with hyperglycemia: Secondary | ICD-10-CM | POA: Diagnosis present

## 2023-08-25 DIAGNOSIS — I480 Paroxysmal atrial fibrillation: Secondary | ICD-10-CM | POA: Diagnosis not present

## 2023-08-25 DIAGNOSIS — Z9889 Other specified postprocedural states: Secondary | ICD-10-CM | POA: Diagnosis not present

## 2023-08-25 DIAGNOSIS — T827XXA Infection and inflammatory reaction due to other cardiac and vascular devices, implants and grafts, initial encounter: Principal | ICD-10-CM | POA: Diagnosis present

## 2023-08-25 DIAGNOSIS — I130 Hypertensive heart and chronic kidney disease with heart failure and stage 1 through stage 4 chronic kidney disease, or unspecified chronic kidney disease: Secondary | ICD-10-CM | POA: Diagnosis present

## 2023-08-25 DIAGNOSIS — Z7901 Long term (current) use of anticoagulants: Secondary | ICD-10-CM | POA: Diagnosis not present

## 2023-08-25 DIAGNOSIS — Z79899 Other long term (current) drug therapy: Secondary | ICD-10-CM

## 2023-08-25 DIAGNOSIS — M159 Polyosteoarthritis, unspecified: Secondary | ICD-10-CM | POA: Diagnosis present

## 2023-08-25 DIAGNOSIS — Z833 Family history of diabetes mellitus: Secondary | ICD-10-CM

## 2023-08-25 DIAGNOSIS — N179 Acute kidney failure, unspecified: Secondary | ICD-10-CM | POA: Diagnosis not present

## 2023-08-25 DIAGNOSIS — I110 Hypertensive heart disease with heart failure: Secondary | ICD-10-CM | POA: Diagnosis not present

## 2023-08-25 DIAGNOSIS — R011 Cardiac murmur, unspecified: Secondary | ICD-10-CM | POA: Diagnosis not present

## 2023-08-25 DIAGNOSIS — I252 Old myocardial infarction: Secondary | ICD-10-CM

## 2023-08-25 DIAGNOSIS — T84621A Infection and inflammatory reaction due to internal fixation device of left femur, initial encounter: Secondary | ICD-10-CM | POA: Diagnosis not present

## 2023-08-25 DIAGNOSIS — Z794 Long term (current) use of insulin: Secondary | ICD-10-CM | POA: Diagnosis not present

## 2023-08-25 DIAGNOSIS — Z83438 Family history of other disorder of lipoprotein metabolism and other lipidemia: Secondary | ICD-10-CM

## 2023-08-25 DIAGNOSIS — R1032 Left lower quadrant pain: Secondary | ICD-10-CM | POA: Diagnosis present

## 2023-08-25 DIAGNOSIS — I472 Ventricular tachycardia, unspecified: Secondary | ICD-10-CM | POA: Diagnosis not present

## 2023-08-25 DIAGNOSIS — Z881 Allergy status to other antibiotic agents status: Secondary | ICD-10-CM

## 2023-08-25 DIAGNOSIS — G8929 Other chronic pain: Secondary | ICD-10-CM | POA: Diagnosis present

## 2023-08-25 DIAGNOSIS — N1832 Chronic kidney disease, stage 3b: Secondary | ICD-10-CM | POA: Diagnosis present

## 2023-08-25 DIAGNOSIS — N401 Enlarged prostate with lower urinary tract symptoms: Secondary | ICD-10-CM | POA: Diagnosis present

## 2023-08-25 DIAGNOSIS — B9561 Methicillin susceptible Staphylococcus aureus infection as the cause of diseases classified elsewhere: Secondary | ICD-10-CM | POA: Diagnosis not present

## 2023-08-25 DIAGNOSIS — Z888 Allergy status to other drugs, medicaments and biological substances status: Secondary | ICD-10-CM

## 2023-08-25 DIAGNOSIS — E119 Type 2 diabetes mellitus without complications: Secondary | ICD-10-CM | POA: Diagnosis not present

## 2023-08-25 DIAGNOSIS — F32A Depression, unspecified: Secondary | ICD-10-CM | POA: Diagnosis not present

## 2023-08-25 DIAGNOSIS — E039 Hypothyroidism, unspecified: Secondary | ICD-10-CM | POA: Diagnosis present

## 2023-08-25 DIAGNOSIS — Z955 Presence of coronary angioplasty implant and graft: Secondary | ICD-10-CM

## 2023-08-25 DIAGNOSIS — I739 Peripheral vascular disease, unspecified: Secondary | ICD-10-CM | POA: Diagnosis present

## 2023-08-25 DIAGNOSIS — N400 Enlarged prostate without lower urinary tract symptoms: Secondary | ICD-10-CM | POA: Diagnosis not present

## 2023-08-25 DIAGNOSIS — A4901 Methicillin susceptible Staphylococcus aureus infection, unspecified site: Secondary | ICD-10-CM

## 2023-08-25 DIAGNOSIS — I5022 Chronic systolic (congestive) heart failure: Secondary | ICD-10-CM | POA: Diagnosis not present

## 2023-08-25 DIAGNOSIS — E1122 Type 2 diabetes mellitus with diabetic chronic kidney disease: Secondary | ICD-10-CM | POA: Diagnosis present

## 2023-08-25 DIAGNOSIS — Z720 Tobacco use: Secondary | ICD-10-CM | POA: Diagnosis not present

## 2023-08-25 DIAGNOSIS — Z8249 Family history of ischemic heart disease and other diseases of the circulatory system: Secondary | ICD-10-CM

## 2023-08-25 DIAGNOSIS — Z823 Family history of stroke: Secondary | ICD-10-CM

## 2023-08-25 DIAGNOSIS — Z87891 Personal history of nicotine dependence: Secondary | ICD-10-CM

## 2023-08-25 DIAGNOSIS — T8189XA Other complications of procedures, not elsewhere classified, initial encounter: Secondary | ICD-10-CM | POA: Diagnosis not present

## 2023-08-25 DIAGNOSIS — I25118 Atherosclerotic heart disease of native coronary artery with other forms of angina pectoris: Secondary | ICD-10-CM | POA: Diagnosis not present

## 2023-08-25 DIAGNOSIS — T827XXS Infection and inflammatory reaction due to other cardiac and vascular devices, implants and grafts, sequela: Principal | ICD-10-CM

## 2023-08-25 DIAGNOSIS — Z8551 Personal history of malignant neoplasm of bladder: Secondary | ICD-10-CM

## 2023-08-25 DIAGNOSIS — T86828 Other complications of skin graft (allograft) (autograft): Secondary | ICD-10-CM | POA: Diagnosis not present

## 2023-08-25 DIAGNOSIS — Z95828 Presence of other vascular implants and grafts: Secondary | ICD-10-CM | POA: Diagnosis not present

## 2023-08-25 DIAGNOSIS — I70219 Atherosclerosis of native arteries of extremities with intermittent claudication, unspecified extremity: Secondary | ICD-10-CM | POA: Diagnosis present

## 2023-08-25 LAB — CBC
HCT: 39.2 % (ref 39.0–52.0)
Hemoglobin: 12.4 g/dL — ABNORMAL LOW (ref 13.0–17.0)
MCH: 28.5 pg (ref 26.0–34.0)
MCHC: 31.6 g/dL (ref 30.0–36.0)
MCV: 90.1 fL (ref 80.0–100.0)
Platelets: 341 10*3/uL (ref 150–400)
RBC: 4.35 MIL/uL (ref 4.22–5.81)
RDW: 15.3 % (ref 11.5–15.5)
WBC: 13.5 10*3/uL — ABNORMAL HIGH (ref 4.0–10.5)
nRBC: 0 % (ref 0.0–0.2)

## 2023-08-25 LAB — COMPREHENSIVE METABOLIC PANEL WITH GFR
ALT: 10 U/L (ref 0–44)
AST: 17 U/L (ref 15–41)
Albumin: 3 g/dL — ABNORMAL LOW (ref 3.5–5.0)
Alkaline Phosphatase: 60 U/L (ref 38–126)
Anion gap: 14 (ref 5–15)
BUN: 25 mg/dL — ABNORMAL HIGH (ref 8–23)
CO2: 19 mmol/L — ABNORMAL LOW (ref 22–32)
Calcium: 9.3 mg/dL (ref 8.9–10.3)
Chloride: 104 mmol/L (ref 98–111)
Creatinine, Ser: 1.68 mg/dL — ABNORMAL HIGH (ref 0.61–1.24)
GFR, Estimated: 41 mL/min — ABNORMAL LOW (ref >60)
Glucose, Bld: 231 mg/dL — ABNORMAL HIGH (ref 70–99)
Potassium: 4.9 mmol/L (ref 3.5–5.1)
Sodium: 137 mmol/L (ref 135–145)
Total Bilirubin: 0.8 mg/dL (ref 0.0–1.2)
Total Protein: 7.3 g/dL (ref 6.5–8.1)

## 2023-08-25 LAB — VAS US ABI WITH/WO TBI
Left ABI: 1
Right ABI: 1.1

## 2023-08-25 LAB — PROTIME-INR
INR: 2.2 — ABNORMAL HIGH (ref 0.8–1.2)
Prothrombin Time: 24.5 s — ABNORMAL HIGH (ref 11.4–15.2)

## 2023-08-25 LAB — GLUCOSE, CAPILLARY
Glucose-Capillary: 179 mg/dL — ABNORMAL HIGH (ref 70–99)
Glucose-Capillary: 250 mg/dL — ABNORMAL HIGH (ref 70–99)

## 2023-08-25 MED ORDER — ACETAMINOPHEN 650 MG RE SUPP: 325.0000 mg | RECTAL | Status: DC | PRN

## 2023-08-25 MED ORDER — OXYCODONE HCL 5 MG PO TABS
5.0000 mg | ORAL_TABLET | ORAL | Status: DC | PRN
  Administered 2023-08-26 – 2023-09-01 (×7): 10 mg via ORAL
  Filled 2023-08-25 (×8): qty 2

## 2023-08-25 MED ORDER — SODIUM CHLORIDE 0.9 % IV SOLN
INTRAVENOUS | Status: AC
Start: 2023-08-25 — End: 2023-08-26

## 2023-08-25 MED ORDER — POTASSIUM CHLORIDE CRYS ER 20 MEQ PO TBCR
20.0000 meq | EXTENDED_RELEASE_TABLET | Freq: Once | ORAL | Status: AC
  Administered 2023-08-25: 40 meq via ORAL
  Filled 2023-08-25: qty 2

## 2023-08-25 MED ORDER — INSULIN GLARGINE 100 UNITS/ML SOLOSTAR PEN: 35.0000 [IU] | PEN_INJECTOR | SUBCUTANEOUS | Status: DC

## 2023-08-25 MED ORDER — AMLODIPINE BESYLATE 5 MG PO TABS
5.0000 mg | ORAL_TABLET | Freq: Every day | ORAL | Status: DC
  Administered 2023-08-27: 5 mg via ORAL
  Filled 2023-08-25 (×3): qty 1

## 2023-08-25 MED ORDER — MORPHINE SULFATE (PF) 2 MG/ML IV SOLN
2.0000 mg | INTRAVENOUS | Status: DC | PRN
  Administered 2023-08-25 – 2023-09-01 (×15): 2 mg via INTRAVENOUS
  Filled 2023-08-25 (×16): qty 1

## 2023-08-25 MED ORDER — INSULIN GLARGINE-YFGN 100 UNIT/ML ~~LOC~~ SOLN
35.0000 [IU] | Freq: Every day | SUBCUTANEOUS | Status: DC
  Administered 2023-08-25 – 2023-08-29 (×5): 35 [IU] via SUBCUTANEOUS
  Filled 2023-08-25 (×8): qty 0.35

## 2023-08-25 MED ORDER — ALUM & MAG HYDROXIDE-SIMETH 200-200-20 MG/5ML PO SUSP
15.0000 mL | ORAL | Status: DC | PRN
  Administered 2023-08-26 – 2023-08-28 (×2): 30 mL via ORAL
  Filled 2023-08-25 (×2): qty 30

## 2023-08-25 MED ORDER — PIPERACILLIN-TAZOBACTAM 3.375 G IVPB
3.3750 g | Freq: Three times a day (TID) | INTRAVENOUS | Status: DC
  Administered 2023-08-25 – 2023-08-29 (×11): 3.375 g via INTRAVENOUS
  Filled 2023-08-25 (×11): qty 50

## 2023-08-25 MED ORDER — LABETALOL HCL 5 MG/ML IV SOLN: 10.0000 mg | INTRAVENOUS | Status: DC | PRN

## 2023-08-25 MED ORDER — VANCOMYCIN HCL 1.25 G IV SOLR
1250.0000 mg | INTRAVENOUS | Status: DC
  Filled 2023-08-25: qty 25

## 2023-08-25 MED ORDER — TAMSULOSIN HCL 0.4 MG PO CAPS
0.8000 mg | ORAL_CAPSULE | Freq: Every evening | ORAL | Status: DC
  Administered 2023-08-26 – 2023-08-31 (×5): 0.8 mg via ORAL
  Filled 2023-08-25 (×8): qty 2

## 2023-08-25 MED ORDER — ISOSORBIDE DINITRATE 10 MG PO TABS
10.0000 mg | ORAL_TABLET | Freq: Two times a day (BID) | ORAL | Status: DC
  Administered 2023-08-25 – 2023-08-27 (×4): 10 mg via ORAL
  Filled 2023-08-25 (×6): qty 1

## 2023-08-25 MED ORDER — EMPAGLIFLOZIN 10 MG PO TABS
10.0000 mg | ORAL_TABLET | Freq: Every day | ORAL | Status: DC
  Filled 2023-08-25: qty 1

## 2023-08-25 MED ORDER — HEPARIN SODIUM (PORCINE) 5000 UNIT/ML IJ SOLN
5000.0000 [IU] | Freq: Three times a day (TID) | INTRAMUSCULAR | Status: DC
  Administered 2023-08-25 – 2023-08-30 (×13): 5000 [IU] via SUBCUTANEOUS
  Filled 2023-08-25 (×13): qty 1

## 2023-08-25 MED ORDER — ONDANSETRON HCL 4 MG/2ML IJ SOLN: 4.0000 mg | Freq: Four times a day (QID) | INTRAMUSCULAR | Status: DC | PRN

## 2023-08-25 MED ORDER — ACETAMINOPHEN 325 MG PO TABS
325.0000 mg | ORAL_TABLET | ORAL | Status: DC | PRN
  Administered 2023-08-25 – 2023-09-01 (×4): 650 mg via ORAL
  Filled 2023-08-25 (×5): qty 2

## 2023-08-25 MED ORDER — SACUBITRIL-VALSARTAN 49-51 MG PO TABS
1.0000 | ORAL_TABLET | Freq: Two times a day (BID) | ORAL | Status: DC
  Administered 2023-08-25 – 2023-08-27 (×3): 1 via ORAL
  Filled 2023-08-25 (×4): qty 1

## 2023-08-25 MED ORDER — CARVEDILOL 12.5 MG PO TABS
12.5000 mg | ORAL_TABLET | Freq: Two times a day (BID) | ORAL | Status: DC
  Administered 2023-08-25 – 2023-09-01 (×13): 12.5 mg via ORAL
  Filled 2023-08-25 (×14): qty 1

## 2023-08-25 MED ORDER — EZETIMIBE 10 MG PO TABS
10.0000 mg | ORAL_TABLET | Freq: Every day | ORAL | Status: DC
  Administered 2023-08-27 – 2023-09-01 (×6): 10 mg via ORAL
  Filled 2023-08-25 (×8): qty 1

## 2023-08-25 MED ORDER — PHENOL 1.4 % MT LIQD: 1.0000 | OROMUCOSAL | Status: DC | PRN

## 2023-08-25 MED ORDER — CYCLOBENZAPRINE HCL 10 MG PO TABS
10.0000 mg | ORAL_TABLET | Freq: Three times a day (TID) | ORAL | Status: DC | PRN
  Administered 2023-08-27: 10 mg via ORAL
  Filled 2023-08-25 (×2): qty 1

## 2023-08-25 MED ORDER — POLYETHYLENE GLYCOL 3350 17 G PO PACK: 17.0000 g | PACK | Freq: Every day | ORAL | Status: DC | PRN

## 2023-08-25 MED ORDER — INSULIN ASPART 100 UNIT/ML IJ SOLN
0.0000 [IU] | Freq: Three times a day (TID) | INTRAMUSCULAR | Status: DC
  Administered 2023-08-25: 3 [IU] via SUBCUTANEOUS
  Administered 2023-08-26: 5 [IU] via SUBCUTANEOUS
  Administered 2023-08-26: 8 [IU] via SUBCUTANEOUS

## 2023-08-25 MED ORDER — GUAIFENESIN-DM 100-10 MG/5ML PO SYRP: 15.0000 mL | ORAL_SOLUTION | ORAL | Status: DC | PRN

## 2023-08-25 MED ORDER — ROSUVASTATIN CALCIUM 20 MG PO TABS
40.0000 mg | ORAL_TABLET | Freq: Every day | ORAL | Status: DC
  Administered 2023-08-25 – 2023-09-01 (×7): 40 mg via ORAL
  Filled 2023-08-25 (×8): qty 2

## 2023-08-25 MED ORDER — BISACODYL 5 MG PO TBEC: 5.0000 mg | DELAYED_RELEASE_TABLET | Freq: Every day | ORAL | Status: DC | PRN

## 2023-08-25 MED ORDER — VANCOMYCIN HCL 1500 MG/300ML IV SOLN
1500.0000 mg | Freq: Once | INTRAVENOUS | Status: AC
  Administered 2023-08-25: 1500 mg via INTRAVENOUS
  Filled 2023-08-25: qty 300

## 2023-08-25 MED ORDER — HYDRALAZINE HCL 20 MG/ML IJ SOLN: 5.0000 mg | INTRAMUSCULAR | Status: DC | PRN

## 2023-08-25 MED ORDER — METOPROLOL TARTRATE 5 MG/5ML IV SOLN
2.0000 mg | INTRAVENOUS | Status: DC | PRN
Start: 2023-08-25 — End: 2023-09-01

## 2023-08-25 MED ORDER — ASPIRIN 81 MG PO TBEC
81.0000 mg | DELAYED_RELEASE_TABLET | Freq: Every day | ORAL | Status: DC
  Administered 2023-08-27 – 2023-09-01 (×6): 81 mg via ORAL
  Filled 2023-08-25 (×8): qty 1

## 2023-08-25 MED ORDER — PANTOPRAZOLE SODIUM 40 MG PO TBEC
40.0000 mg | DELAYED_RELEASE_TABLET | Freq: Every day | ORAL | Status: DC
  Administered 2023-08-25 – 2023-09-01 (×7): 40 mg via ORAL
  Filled 2023-08-25 (×8): qty 1

## 2023-08-25 MED ORDER — LOPERAMIDE HCL 2 MG PO CAPS
2.0000 mg | ORAL_CAPSULE | Freq: Four times a day (QID) | ORAL | Status: DC | PRN
  Administered 2023-08-28 – 2023-08-30 (×4): 2 mg via ORAL
  Filled 2023-08-25 (×4): qty 1

## 2023-08-25 NOTE — H&P (Signed)
 HPI: [name] is a 78 y.o. ([birth year]) male who presents for follow up.  He has a history of aortobifemoral bypass over 20 years ago, complicated by right lower extremity occlusion.  This required left to right femorofemoral bypass.  He presented to the hospital in March with acute pain and numbness of his right foot.  He underwent right lower extremity angiogram with right SFA and AT mechanical thrombectomy and right SFA drug-coated balloon angioplasty on 07/10/2023 by Dr. Susi Eric.   He went to the emergency room on Monday after experiencing sudden onset left groin pain.  He had no inciting events to his groin pain.  He had severe pain in his left groin with any movement or palpation.  CTA did not demonstrate any pseudoaneurysm or fluid collections.  There was some stranding around his existing femorofemoral graft, but it was unknown whether this was inflammatory, infectious, or secondary to low-grade trauma.  He was discharged from the emergency room with pain medication and expected to follow-up with our office today.   He returns today for follow-up.  He says that he is not doing well.  He is taking pain medication every 8 hours to try to control his pain.  He has severe aching left groin pain whenever he tries to move.  He says over the past couple of days his pain has not gotten any better.  His family has also noticed some increased swelling and redness to the left groin.  He denies any fevers or chills.  He denies any pain in his lower extremities or feet.   He has taken his Xarelto  today.         Current Outpatient Medications  Medication Sig Dispense Refill   amLODipine  (NORVASC ) 5 MG tablet Take 1 tablet (5 mg total) by mouth daily. 90 tablet 3   aspirin  EC 81 MG tablet Take 1 tablet (81 mg total) by mouth daily. Swallow whole. 90 tablet 3   carvedilol  (COREG ) 12.5 MG tablet Take 1 tablet (12.5 mg total) by mouth 2 (two) times daily with a meal. 180 tablet 3   cetirizine  (ZYRTEC ) 10  MG tablet Take 1 tablet (10 mg total) by mouth daily. 90 tablet 0   cyclobenzaprine  (FLEXERIL ) 10 MG tablet Take 1 tablet (10 mg total) by mouth 3 (three) times daily as needed for muscle spasms. 15 tablet 0   ezetimibe  (ZETIA ) 10 MG tablet Take 1 tablet (10 mg total) by mouth daily. 90 tablet 3   insulin  glargine (LANTUS ) 100 unit/mL SOPN 35 units subcut daily       isosorbide dinitrate (ISORDIL) 10 MG tablet Take 10 mg by mouth 2 (two) times daily.       JARDIANCE  10 MG TABS tablet Take 1 tablet (10 mg total) by mouth daily. 30 tablet 2   loperamide  (IMODIUM  A-D) 2 MG tablet Take 2 mg by mouth 4 (four) times daily as needed for diarrhea or loose stools.       nitroGLYCERIN  (NITROSTAT ) 0.4 MG SL tablet Place 0.4 mg under the tongue every 5 (five) minutes as needed for chest pain.       oxyCODONE -acetaminophen  (PERCOCET/ROXICET) 5-325 MG tablet Take 1 tablet by mouth every 6 (six) hours as needed for severe pain (pain score 7-10). 10 tablet 0   rivaroxaban  (XARELTO ) 20 MG TABS tablet Take 1 tablet (20 mg total) by mouth daily with supper. 30 tablet 3   rosuvastatin  (CRESTOR ) 40 MG tablet TAKE 1 TABLET(40 MG) BY MOUTH  DAILY 90 tablet 1   sacubitril -valsartan  (ENTRESTO ) 49-51 MG Take 1 tablet by mouth 2 (two) times daily. 60 tablet 3   tamsulosin  (FLOMAX ) 0.4 MG CAPS capsule Take 0.8 mg by mouth every evening.       traMADol  (ULTRAM ) 50 MG tablet Take 1-2 tablets (50-100 mg total) by mouth every 6 (six) hours as needed for moderate pain (pain score 4-6). 20 tablet 0      No current facility-administered medications for this visit.        REVIEW OF SYSTEMS (negative unless checked):    Cardiac:  []  Chest pain or chest pressure? []  Shortness of breath upon activity? []  Shortness of breath when lying flat? []  Irregular heart rhythm?   Vascular:  []  Pain in calf, thigh, or hip brought on by walking? []  Pain in feet at night that wakes you up from your sleep? []  Blood clot in your veins? []   Leg swelling?   Pulmonary:  []  Oxygen at home? []  Productive cough? []  Wheezing?   Neurologic:  []  Sudden weakness in arms or legs? []  Sudden numbness in arms or legs? []  Sudden onset of difficult speaking or slurred speech? []  Temporary loss of vision in one eye? []  Problems with dizziness?   Gastrointestinal:  []  Blood in stool? []  Vomited blood?   Genitourinary:  []  Burning when urinating? []  Blood in urine?   Psychiatric:  []  Major depression   Hematologic:  []  Bleeding problems? []  Problems with blood clotting?   Dermatologic:  [x]  Rashes or ulcers?   Constitutional:  []  Fever or chills?   Ear/Nose/Throat:  []  Change in hearing? []  Nose bleeds? []  Sore throat?   Musculoskeletal:  []  Back pain? []  Joint pain? []  Muscle pain?     Physical Examination         There were no vitals filed for this visit.  General:  WDWN in NAD; vital signs documented above Gait: Not observed HENT: WNL, normocephalic Pulmonary: normal non-labored breathing , without rales, rhonchi,  wheezing Cardiac: regular Abdomen: soft, NT, no masses Vascular Exam/Pulses: brisk DP/PT doppler signals bilaterally Extremities: left groin with swelling and erythema, tender to palpation Musculoskeletal: no muscle wasting or atrophy      Neurologic: A&O X 3;  No focal weakness or paresthesias are detected Psychiatric:  The pt has Normal affect.   Non-Invasive Vascular imaging    ABI (08/25/2023) R:  ABI: 1.1 (0.4),  PT: bi DP: tri TBI:  0.87 L:  ABI: 1.00 (0.96),  PT: tri DP: tri TBI: 0.69   RLE Arterial Duplex (08/25/2023) +-----------+--------+-----+--------+---------+--------+  RIGHT     PSV cm/sRatioStenosisWaveform Comments  +-----------+--------+-----+--------+---------+--------+  CFA Prox   96                   triphasic          +-----------+--------+-----+--------+---------+--------+  SFA Prox   107                  triphasic           +-----------+--------+-----+--------+---------+--------+  SFA Mid    114                  triphasic          +-----------+--------+-----+--------+---------+--------+  SFA Distal 119                  triphasic          +-----------+--------+-----+--------+---------+--------+  POP Prox   103  triphasic          +-----------+--------+-----+--------+---------+--------+  POP Mid    79                   triphasic          +-----------+--------+-----+--------+---------+--------+  POP Distal 79                   triphasic          +-----------+--------+-----+--------+---------+--------+  TP Trunk   57                   triphasic          +-----------+--------+-----+--------+---------+--------+  PTA Distal 113                  triphasic          +-----------+--------+-----+--------+---------+--------+  PERO Distal77                   biphasic           +-----------+--------+-----+--------+---------+--------+     Left Graft #1: left- right fem fem bypass  +--------------------+--------+--------+---------+--------+                     PSV cm/sStenosisWaveform Comments  +--------------------+--------+--------+---------+--------+  Inflow             90              biphasic           +--------------------+--------+--------+---------+--------+  Proximal Anastomosis113             triphasic          +--------------------+--------+--------+---------+--------+  Proximal Graft      65              triphasic          +--------------------+--------+--------+---------+--------+  Mid Graft           33              triphasic          +--------------------+--------+--------+---------+--------+  Distal Graft        31              triphasic          +--------------------+--------+--------+---------+--------+  Distal Anastomosis  35              triphasic           +--------------------+--------+--------+---------+--------+  Outflow            85              triphasic          +--------------------+--------+--------+---------+--------+     Summary:  Right: Patent lower extremity without evidence of stenosis. Perigraft  fluid noted in the proximal fem-fem bypass.     Medical Decision Making    MENSAH GOSNELL is a 78 y.o. male who presents for follow-up   The patient recently underwent right lower extremity angiogram with right SFA and AT mechanical thrombectomy and right SFA drug-coated balloon angioplasty on 07/10/2023.  This was done for acute right lower extremity ischemia He presented to the emergency room on Monday with new onset severe left groin pain.  He had no inciting factors.  He endorsed severe pain in his left groin with movement or palpation.  CTA demonstrated some stranding around his left groin suggestive of possible inflammation, infection, etc.  He was discharged from the ED and presents today for close  follow-up He says that his left groin pain has not gotten any better since Monday.  He continues to have severe, aching left groin pain whenever he moves or whenever something touches his groin.  His family has also noticed developing swelling and redness around the left groin.  He denies any fevers or chills.  He denies any pain in his lower extremities. His ABIs today on the right are significantly improved.  He has a patent left to right femorofemoral bypass with some sluggish velocities less than 40 cm/s throughout the graft.  Ultrasound demonstrates fluid surrounding the proximal portion of the left to right femorofemoral bypass.  This was not evident on CTA on Monday. I have discussed this case with Dr. Susi Eric and he is also evaluated the patient.  Given that the patient has severe, unimproved left groin pain with swelling and erythema, there is concern that there is infection at the left groin cath site.  There is also high  concern that his infection could be involving his pre-existing femorofemoral bypass graft.  He will require admission to [facility] for IV antibiotics and observation.  Patient and family made aware that the patient may also require left groin washout this weekend.  The patient and family was also made aware that there is a chance his infection could involve his pre-existing femorofemoral bypass or aorto bifemoral bypass.  If his infection involves the graft, he may require resection and redo femorofemoral bypass.  Jenan Ellegood C. Vikki Graves, MD [facility] Office: [phone] Pager: [phone]

## 2023-08-25 NOTE — Progress Notes (Addendum)
 Office Note   History of Present Illness   [name] is a 78 y.o. (December 18, 1945) male who presents for follow up.  He has a history of aortobifemoral bypass over 20 years ago, complicated by right lower extremity occlusion.  This required left to right femorofemoral bypass.  He presented to the hospital in March with acute pain and numbness of his right foot.  He underwent right lower extremity angiogram with right SFA and AT mechanical thrombectomy and right SFA drug-coated balloon angioplasty on 07/10/2023 by Dr. Susi Eric.  He went to the emergency room on Monday after experiencing sudden onset left groin pain.  He had no inciting events to his groin pain.  He had severe pain in his left groin with any movement or palpation.  CTA did not demonstrate any pseudoaneurysm or fluid collections.  There was some stranding around his existing femorofemoral graft, but it was unknown whether this was inflammatory, infectious, or secondary to low-grade trauma.  He was discharged from the emergency room with pain medication and expected to follow-up with our office today.  He returns today for follow-up.  He says that he is not doing well.  He is taking pain medication every 8 hours to try to control his pain.  He has severe aching left groin pain whenever he tries to move.  He says over the past couple of days his pain has not gotten any better.  His family has also noticed some increased swelling and redness to the left groin.  He denies any fevers or chills.  He denies any pain in his lower extremities or feet.  He has taken his Xarelto  today.  Current Outpatient Medications  Medication Sig Dispense Refill   amLODipine  (NORVASC ) 5 MG tablet Take 1 tablet (5 mg total) by mouth daily. 90 tablet 3   aspirin  EC 81 MG tablet Take 1 tablet (81 mg total) by mouth daily. Swallow whole. 90 tablet 3   carvedilol  (COREG ) 12.5 MG tablet Take 1 tablet (12.5 mg total) by mouth 2 (two) times daily with a meal. 180  tablet 3   cetirizine  (ZYRTEC ) 10 MG tablet Take 1 tablet (10 mg total) by mouth daily. 90 tablet 0   cyclobenzaprine  (FLEXERIL ) 10 MG tablet Take 1 tablet (10 mg total) by mouth 3 (three) times daily as needed for muscle spasms. 15 tablet 0   ezetimibe  (ZETIA ) 10 MG tablet Take 1 tablet (10 mg total) by mouth daily. 90 tablet 3   insulin  glargine (LANTUS ) 100 unit/mL SOPN 35 units subcut daily     isosorbide dinitrate (ISORDIL) 10 MG tablet Take 10 mg by mouth 2 (two) times daily.     JARDIANCE  10 MG TABS tablet Take 1 tablet (10 mg total) by mouth daily. 30 tablet 2   loperamide  (IMODIUM  A-D) 2 MG tablet Take 2 mg by mouth 4 (four) times daily as needed for diarrhea or loose stools.     nitroGLYCERIN  (NITROSTAT ) 0.4 MG SL tablet Place 0.4 mg under the tongue every 5 (five) minutes as needed for chest pain.     oxyCODONE -acetaminophen  (PERCOCET/ROXICET) 5-325 MG tablet Take 1 tablet by mouth every 6 (six) hours as needed for severe pain (pain score 7-10). 10 tablet 0   rivaroxaban  (XARELTO ) 20 MG TABS tablet Take 1 tablet (20 mg total) by mouth daily with supper. 30 tablet 3   rosuvastatin  (CRESTOR ) 40 MG tablet TAKE 1 TABLET(40 MG) BY MOUTH DAILY 90 tablet 1   sacubitril -valsartan  (ENTRESTO ) 49-51  MG Take 1 tablet by mouth 2 (two) times daily. 60 tablet 3   tamsulosin  (FLOMAX ) 0.4 MG CAPS capsule Take 0.8 mg by mouth every evening.     traMADol  (ULTRAM ) 50 MG tablet Take 1-2 tablets (50-100 mg total) by mouth every 6 (six) hours as needed for moderate pain (pain score 4-6). 20 tablet 0   No current facility-administered medications for this visit.    REVIEW OF SYSTEMS (negative unless checked):   Cardiac:  []  Chest pain or chest pressure? []  Shortness of breath upon activity? []  Shortness of breath when lying flat? []  Irregular heart rhythm?  Vascular:  []  Pain in calf, thigh, or hip brought on by walking? []  Pain in feet at night that wakes you up from your sleep? []  Blood clot in  your veins? []  Leg swelling?  Pulmonary:  []  Oxygen at home? []  Productive cough? []  Wheezing?  Neurologic:  []  Sudden weakness in arms or legs? []  Sudden numbness in arms or legs? []  Sudden onset of difficult speaking or slurred speech? []  Temporary loss of vision in one eye? []  Problems with dizziness?  Gastrointestinal:  []  Blood in stool? []  Vomited blood?  Genitourinary:  []  Burning when urinating? []  Blood in urine?  Psychiatric:  []  Major depression  Hematologic:  []  Bleeding problems? []  Problems with blood clotting?  Dermatologic:  [x]  Rashes or ulcers?  Constitutional:  []  Fever or chills?  Ear/Nose/Throat:  []  Change in hearing? []  Nose bleeds? []  Sore throat?  Musculoskeletal:  []  Back pain? []  Joint pain? []  Muscle pain?   Physical Examination    Vitals:   08/25/23 1419  BP: (!) 143/71  Pulse: 100  Resp: (!) 93  Temp: 98.4 F (36.9 C)  TempSrc: Temporal  Weight: 176 lb 8 oz (80.1 kg)  Height: 5\' 7"  (1.702 m)   Body mass index is 27.64 kg/m.  General:  WDWN in NAD; vital signs documented above Gait: Not observed HENT: WNL, normocephalic Pulmonary: normal non-labored breathing , without rales, rhonchi,  wheezing Cardiac: regular Abdomen: soft, NT, no masses Vascular Exam/Pulses: brisk DP/PT doppler signals bilaterally Extremities: left groin with swelling and erythema, tender to palpation Musculoskeletal: no muscle wasting or atrophy  Neurologic: A&O X 3;  No focal weakness or paresthesias are detected Psychiatric:  The pt has Normal affect.  Non-Invasive Vascular imaging   ABI (08/25/2023) R:  ABI: 1.1 (0.4),  PT: bi DP: tri TBI:  0.87 L:  ABI: 1.00 (0.96),  PT: tri DP: tri TBI: 0.69  RLE Arterial Duplex (08/25/2023) +-----------+--------+-----+--------+---------+--------+  RIGHT     PSV cm/sRatioStenosisWaveform Comments  +-----------+--------+-----+--------+---------+--------+  CFA Prox   96                    triphasic          +-----------+--------+-----+--------+---------+--------+  SFA Prox   107                  triphasic          +-----------+--------+-----+--------+---------+--------+  SFA Mid    114                  triphasic          +-----------+--------+-----+--------+---------+--------+  SFA Distal 119                  triphasic          +-----------+--------+-----+--------+---------+--------+  POP Prox   103  triphasic          +-----------+--------+-----+--------+---------+--------+  POP Mid    79                   triphasic          +-----------+--------+-----+--------+---------+--------+  POP Distal 79                   triphasic          +-----------+--------+-----+--------+---------+--------+  TP Trunk   57                   triphasic          +-----------+--------+-----+--------+---------+--------+  PTA Distal 113                  triphasic          +-----------+--------+-----+--------+---------+--------+  PERO Distal77                   biphasic           +-----------+--------+-----+--------+---------+--------+     Left Graft #1: left- right fem fem bypass  +--------------------+--------+--------+---------+--------+                     PSV cm/sStenosisWaveform Comments  +--------------------+--------+--------+---------+--------+  Inflow             90              biphasic           +--------------------+--------+--------+---------+--------+  Proximal Anastomosis113             triphasic          +--------------------+--------+--------+---------+--------+  Proximal Graft      65              triphasic          +--------------------+--------+--------+---------+--------+  Mid Graft           33              triphasic          +--------------------+--------+--------+---------+--------+  Distal Graft        31              triphasic           +--------------------+--------+--------+---------+--------+  Distal Anastomosis  35              triphasic          +--------------------+--------+--------+---------+--------+  Outflow            85              triphasic          +--------------------+--------+--------+---------+--------+     Summary:  Right: Patent lower extremity without evidence of stenosis. Perigraft  fluid noted in the proximal fem-fem bypass.     Medical Decision Making   ZYMARION DELPRADO is a 78 y.o. male who presents for follow-up  The patient recently underwent right lower extremity angiogram with right SFA and AT mechanical thrombectomy and right SFA drug-coated balloon angioplasty on 07/10/2023.  This was done for acute right lower extremity ischemia He presented to the emergency room on Monday with new onset severe left groin pain.  He had no inciting factors.  He endorsed severe pain in his left groin with movement or palpation.  CTA demonstrated some stranding around his left groin suggestive of possible inflammation, infection, etc.  He was discharged from the ED and presents today for close follow-up He  says that his left groin pain has not gotten any better since Monday.  He continues to have severe, aching left groin pain whenever he moves or whenever something touches his groin.  His family has also noticed developing swelling and redness around the left groin.  He denies any fevers or chills.  He denies any pain in his lower extremities. His ABIs today on the right are significantly improved.  He has a patent left to right femorofemoral bypass with some sluggish velocities less than 40 cm/s throughout the graft.  Ultrasound demonstrates fluid surrounding the proximal portion of the left to right femorofemoral bypass.  This was not evident on CTA on Monday. I have discussed this case with Dr. Susi Eric and he is also evaluated the patient.  Given that the patient has severe, unimproved left groin pain  with swelling and erythema, there is concern that there is infection at the left groin cath site.  There is also high concern that his infection could be involving his pre-existing femorofemoral bypass graft.  He will require admission to [facility] for IV antibiotics and observation.  Patient and family made aware that the patient may also require left groin washout this weekend.  The patient and family was also made aware that there is a chance his infection could involve his pre-existing femorofemoral bypass or aorto bifemoral bypass.  If his infection involves the graft, he may require resection and redo femorofemoral bypass. I have placed direct admit orders for the patient for [name] today.  He will be evaluated by Dr. Vikki Graves later this afternoon   Deneise Finlay PA-C [facility] Office: [phone]  Clinic MD: Susi Eric

## 2023-08-25 NOTE — Progress Notes (Signed)
 Pharmacy Antibiotic Note  [name] is a 78 y.o. male admitted on 08/25/2023 with L groin infection with possible graft involvement after recent RLE angiogram, thrombectomy, balloon angioplasty.  Pharmacy has been consulted for vancomycin and piperacillin /tazobactam dosing.  5/9 Vancomycin 1250mg  Q 24 hr with Est AUC: 495 Scr used: 1.2 mg/dL; Vd coeff: 0.7 L/kg  Plan: Vancomycin 1500mg  x1 then 1250mg  q24hr piperacillin /tazobactam 3.375g q8hr Monitor cultures, clinical status, renal function, vancomycin level Narrow abx as able and f/u duration     Temp (24hrs), Avg:98.4 F (36.9 C), Min:98.4 F (36.9 C), Max:98.4 F (36.9 C)  Recent Labs  Lab 08/21/23 1322 08/21/23 1610  WBC  --  11.8*  CREATININE 1.20  --     Estimated Creatinine Clearance: 51.5 mL/min (by C-G formula based on SCr of 1.2 mg/dL).    Allergies  Allergen Reactions   Lisinopril  Swelling and Rash    Rash - face and tounge swell    Antimicrobials this admission: vanc 5/9 >>  piptazo 5/9 >>   Dose adjustments this admission: N/a  Microbiology results:   Thank you for allowing pharmacy to be a part of this patient's care.  Dorene Gang, PharmD, BCPS, BCCP Clinical Pharmacist  Please check AMION for all [facility] Pharmacy phone numbers After 10:00 PM, call Main Pharmacy [phone]

## 2023-08-25 NOTE — Anesthesia Preprocedure Evaluation (Signed)
 Anesthesia Evaluation  Patient identified by MRN, date of birth, ID band Patient awake  General Assessment Comment:Sedated from morphine  this morning  Reviewed: Allergy & Precautions, NPO status , Patient's Chart, lab work & pertinent test results  History of Anesthesia Complications Negative for: history of anesthetic complications  Airway Mallampati: II  TM Distance: >3 FB Neck ROM: Full    Dental  (+) Poor Dentition, Missing, Dental Advisory Given   Pulmonary former smoker   breath sounds clear to auscultation       Cardiovascular hypertension, Pt. on medications and Pt. on home beta blockers (-) angina + CAD, + Past MI, + Cardiac Stents, + Peripheral Vascular Disease and +CHF (Entresto )  + dysrhythmias Atrial Fibrillation and Ventricular Tachycardia  Rhythm:Irregular Rate:Normal  07/2023 ECHO: EF 30-35%, global hypokinesis with akinetic inferior and inferoseptal segments, normal RVF, mild MR   Neuro/Psych    Depression    sedated    GI/Hepatic negative GI ROS, Neg liver ROS,,,  Endo/Other  diabetes (glu 160), Insulin  Dependent, Oral Hypoglycemic AgentsHypothyroidism    Renal/GU Renal hypertensionRenal disease   Bladder cancer    Musculoskeletal   Abdominal   Peds  Hematology Xarelto  Hb 12.4, plt 341k   Anesthesia Other Findings   Reproductive/Obstetrics                             Anesthesia Physical Anesthesia Plan  ASA: 4  Anesthesia Plan: General   Post-op Pain Management: Tylenol  PO (pre-op)*   Induction: Intravenous  PONV Risk Score and Plan: 2 and Ondansetron  and Dexamethasone   Airway Management Planned: Oral ETT  Additional Equipment: None  Intra-op Plan:   Post-operative Plan: Extubation in OR  Informed Consent: I have reviewed the patients History and Physical, chart, labs and discussed the procedure including the risks, benefits and alternatives for the proposed  anesthesia with the patient or authorized representative who has indicated his/her understanding and acceptance.     Dental advisory given  Plan Discussed with: CRNA and Surgeon  Anesthesia Plan Comments:        Anesthesia Quick Evaluation

## 2023-08-26 ENCOUNTER — Inpatient Hospital Stay (HOSPITAL_COMMUNITY): Payer: Self-pay | Admitting: Anesthesiology

## 2023-08-26 ENCOUNTER — Other Ambulatory Visit: Payer: Self-pay

## 2023-08-26 ENCOUNTER — Encounter (HOSPITAL_COMMUNITY)
Admission: AD | Disposition: A | Discharge status: Home Health | Payer: Self-pay | Source: Ambulatory Visit | Attending: Vascular Surgery

## 2023-08-26 ENCOUNTER — Encounter (HOSPITAL_COMMUNITY): Payer: Self-pay | Admitting: Vascular Surgery

## 2023-08-26 DIAGNOSIS — I110 Hypertensive heart disease with heart failure: Secondary | ICD-10-CM

## 2023-08-26 DIAGNOSIS — I2510 Atherosclerotic heart disease of native coronary artery without angina pectoris: Secondary | ICD-10-CM | POA: Diagnosis not present

## 2023-08-26 DIAGNOSIS — I5042 Chronic combined systolic (congestive) and diastolic (congestive) heart failure: Secondary | ICD-10-CM

## 2023-08-26 DIAGNOSIS — T84621A Infection and inflammatory reaction due to internal fixation device of left femur, initial encounter: Secondary | ICD-10-CM

## 2023-08-26 DIAGNOSIS — Z9889 Other specified postprocedural states: Secondary | ICD-10-CM

## 2023-08-26 HISTORY — PX: APPLICATION OF WOUND VAC: SHX5189

## 2023-08-26 HISTORY — PX: INCISION AND DRAINAGE OF WOUND: SHX1803

## 2023-08-26 HISTORY — PX: PATCH ANGIOPLASTY: SHX6230

## 2023-08-26 LAB — BASIC METABOLIC PANEL WITH GFR
Anion gap: 12 (ref 5–15)
BUN: 26 mg/dL — ABNORMAL HIGH (ref 8–23)
CO2: 19 mmol/L — ABNORMAL LOW (ref 22–32)
Calcium: 8.8 mg/dL — ABNORMAL LOW (ref 8.9–10.3)
Chloride: 105 mmol/L (ref 98–111)
Creatinine, Ser: 1.62 mg/dL — ABNORMAL HIGH (ref 0.61–1.24)
GFR, Estimated: 43 mL/min — ABNORMAL LOW (ref >60)
Glucose, Bld: 193 mg/dL — ABNORMAL HIGH (ref 70–99)
Potassium: 4.9 mmol/L (ref 3.5–5.1)
Sodium: 136 mmol/L (ref 135–145)

## 2023-08-26 LAB — GLUCOSE, CAPILLARY
Glucose-Capillary: 160 mg/dL — ABNORMAL HIGH (ref 70–99)
Glucose-Capillary: 174 mg/dL — ABNORMAL HIGH (ref 70–99)
Glucose-Capillary: 245 mg/dL — ABNORMAL HIGH (ref 70–99)
Glucose-Capillary: 279 mg/dL — ABNORMAL HIGH (ref 70–99)
Glucose-Capillary: 411 mg/dL — ABNORMAL HIGH (ref 70–99)
Glucose-Capillary: 449 mg/dL — ABNORMAL HIGH (ref 70–99)

## 2023-08-26 LAB — CBC
HCT: 33.7 % — ABNORMAL LOW (ref 39.0–52.0)
Hemoglobin: 10.7 g/dL — ABNORMAL LOW (ref 13.0–17.0)
MCH: 28.2 pg (ref 26.0–34.0)
MCHC: 31.8 g/dL (ref 30.0–36.0)
MCV: 88.9 fL (ref 80.0–100.0)
Platelets: 330 10*3/uL (ref 150–400)
RBC: 3.79 MIL/uL — ABNORMAL LOW (ref 4.22–5.81)
RDW: 15.4 % (ref 11.5–15.5)
WBC: 11.5 10*3/uL — ABNORMAL HIGH (ref 4.0–10.5)
nRBC: 0 % (ref 0.0–0.2)

## 2023-08-26 LAB — TYPE AND SCREEN
ABO/RH(D): O NEG
Antibody Screen: NEGATIVE

## 2023-08-26 LAB — SURGICAL PCR SCREEN
MRSA, PCR: NEGATIVE
Staphylococcus aureus: POSITIVE — AB

## 2023-08-26 SURGERY — IRRIGATION AND DEBRIDEMENT WOUND
Anesthesia: General | Site: Groin | Laterality: Left

## 2023-08-26 MED ORDER — OXYCODONE HCL 5 MG/5ML PO SOLN: 5.0000 mg | Freq: Once | ORAL | Status: DC | PRN

## 2023-08-26 MED ORDER — ONDANSETRON HCL 4 MG/2ML IJ SOLN
INTRAMUSCULAR | Status: DC | PRN
  Administered 2023-08-26: 4 mg via INTRAVENOUS

## 2023-08-26 MED ORDER — CHLORHEXIDINE GLUCONATE CLOTH 2 % EX PADS
6.0000 | MEDICATED_PAD | Freq: Every day | CUTANEOUS | Status: DC
Start: 2023-08-26 — End: 2023-08-31
  Administered 2023-08-26 – 2023-08-29 (×4): 6 via TOPICAL

## 2023-08-26 MED ORDER — DEXAMETHASONE SODIUM PHOSPHATE 10 MG/ML IJ SOLN
INTRAMUSCULAR | Status: DC | PRN
  Administered 2023-08-26: 10 mg via INTRAVENOUS

## 2023-08-26 MED ORDER — MIDAZOLAM HCL 2 MG/2ML IJ SOLN
INTRAMUSCULAR | Status: AC
  Filled 2023-08-26: qty 2

## 2023-08-26 MED ORDER — VASHE WOUND IRRIGATION OPTIME
TOPICAL | Status: DC | PRN
  Administered 2023-08-26: 34 [oz_av]

## 2023-08-26 MED ORDER — FENTANYL CITRATE (PF) 100 MCG/2ML IJ SOLN: 25.0000 ug | INTRAMUSCULAR | Status: DC | PRN

## 2023-08-26 MED ORDER — PROPOFOL 10 MG/ML IV BOLUS
INTRAVENOUS | Status: AC
  Filled 2023-08-26: qty 20

## 2023-08-26 MED ORDER — EPHEDRINE SULFATE-NACL 50-0.9 MG/10ML-% IV SOSY
PREFILLED_SYRINGE | INTRAVENOUS | Status: DC | PRN
  Administered 2023-08-26: 10 mg via INTRAVENOUS

## 2023-08-26 MED ORDER — OXYCODONE HCL 5 MG PO TABS: 5.0000 mg | ORAL_TABLET | Freq: Once | ORAL | Status: DC | PRN

## 2023-08-26 MED ORDER — FENTANYL CITRATE (PF) 250 MCG/5ML IJ SOLN
INTRAMUSCULAR | Status: DC | PRN
Start: 2023-08-26 — End: 2023-08-26
  Administered 2023-08-26 (×3): 50 ug via INTRAVENOUS

## 2023-08-26 MED ORDER — MUPIROCIN 2 % EX OINT
1.0000 | TOPICAL_OINTMENT | Freq: Two times a day (BID) | CUTANEOUS | Status: DC
  Administered 2023-08-26 – 2023-08-29 (×7): 1 via NASAL
  Filled 2023-08-26: qty 22

## 2023-08-26 MED ORDER — FENTANYL CITRATE (PF) 250 MCG/5ML IJ SOLN
INTRAMUSCULAR | Status: AC
  Filled 2023-08-26: qty 5

## 2023-08-26 MED ORDER — PHENYLEPHRINE 80 MCG/ML (10ML) SYRINGE FOR IV PUSH (FOR BLOOD PRESSURE SUPPORT)
PREFILLED_SYRINGE | INTRAVENOUS | Status: AC
  Filled 2023-08-26: qty 10

## 2023-08-26 MED ORDER — ONDANSETRON HCL 4 MG/2ML IJ SOLN
INTRAMUSCULAR | Status: AC
Start: 2023-08-26 — End: ?
  Filled 2023-08-26: qty 2

## 2023-08-26 MED ORDER — INSULIN ASPART 100 UNIT/ML IJ SOLN: 0.0000 [IU] | INTRAMUSCULAR | Status: DC | PRN

## 2023-08-26 MED ORDER — INSULIN ASPART 100 UNIT/ML IJ SOLN
15.0000 [IU] | Freq: Once | INTRAMUSCULAR | Status: AC
  Administered 2023-08-26: 15 [IU] via SUBCUTANEOUS

## 2023-08-26 MED ORDER — LACTATED RINGERS IV SOLN
INTRAVENOUS | Status: DC | PRN
Start: 2023-08-26 — End: 2023-08-26

## 2023-08-26 MED ORDER — ORAL CARE MOUTH RINSE: 15.0000 mL | Freq: Once | OROMUCOSAL | Status: AC

## 2023-08-26 MED ORDER — 0.9 % SODIUM CHLORIDE (POUR BTL) OPTIME
TOPICAL | Status: DC | PRN
  Administered 2023-08-26: 1000 mL

## 2023-08-26 MED ORDER — LACTATED RINGERS IV SOLN
INTRAVENOUS | Status: DC
Start: 2023-08-26 — End: 2023-08-26

## 2023-08-26 MED ORDER — VANCOMYCIN HCL 1000 MG IV SOLR
INTRAVENOUS | Status: AC
  Filled 2023-08-26: qty 20

## 2023-08-26 MED ORDER — PROPOFOL 10 MG/ML IV BOLUS
INTRAVENOUS | Status: DC | PRN
  Administered 2023-08-26: 100 mg via INTRAVENOUS

## 2023-08-26 MED ORDER — PHENYLEPHRINE HCL-NACL 20-0.9 MG/250ML-% IV SOLN
INTRAVENOUS | Status: DC | PRN
  Administered 2023-08-26: 50 ug/min via INTRAVENOUS

## 2023-08-26 MED ORDER — CHLORHEXIDINE GLUCONATE 0.12 % MT SOLN
OROMUCOSAL | Status: AC
  Administered 2023-08-26: 15 mL via OROMUCOSAL
  Filled 2023-08-26: qty 15

## 2023-08-26 MED ORDER — PHENYLEPHRINE 80 MCG/ML (10ML) SYRINGE FOR IV PUSH (FOR BLOOD PRESSURE SUPPORT)
PREFILLED_SYRINGE | INTRAVENOUS | Status: DC | PRN
  Administered 2023-08-26 (×2): 160 ug via INTRAVENOUS
  Administered 2023-08-26: 80 ug via INTRAVENOUS
  Administered 2023-08-26: 160 ug via INTRAVENOUS

## 2023-08-26 MED ORDER — CHLORHEXIDINE GLUCONATE 0.12 % MT SOLN: 15.0000 mL | Freq: Once | OROMUCOSAL | Status: AC

## 2023-08-26 MED ORDER — VANCOMYCIN HCL 1000 MG IV SOLR
Status: DC | PRN
  Administered 2023-08-26: 12 mL

## 2023-08-26 MED ORDER — VANCOMYCIN HCL IN DEXTROSE 1-5 GM/200ML-% IV SOLN
1000.0000 mg | INTRAVENOUS | Status: DC
  Administered 2023-08-26 – 2023-08-28 (×3): 1000 mg via INTRAVENOUS
  Filled 2023-08-26 (×3): qty 200

## 2023-08-26 MED ORDER — GENTAMICIN SULFATE 40 MG/ML IJ SOLN
INTRAMUSCULAR | Status: AC
  Filled 2023-08-26: qty 6

## 2023-08-26 MED ORDER — MIDAZOLAM HCL 2 MG/2ML IJ SOLN: 0.5000 mg | Freq: Once | INTRAMUSCULAR | Status: DC | PRN

## 2023-08-26 MED ORDER — SODIUM CHLORIDE 0.9 % IR SOLN
Status: DC | PRN
  Administered 2023-08-26: 1000 mL

## 2023-08-26 MED ORDER — DEXAMETHASONE SODIUM PHOSPHATE 10 MG/ML IJ SOLN
INTRAMUSCULAR | Status: AC
  Filled 2023-08-26: qty 1

## 2023-08-26 SURGICAL SUPPLY — 46 items
BAG COUNTER SPONGE SURGICOUNT (BAG) ×1 IMPLANT
BNDG ELASTIC 4X5.8 VLCR STR LF (GAUZE/BANDAGES/DRESSINGS) IMPLANT
BNDG ELASTIC 6INX 5YD STR LF (GAUZE/BANDAGES/DRESSINGS) IMPLANT
BNDG GAUZE DERMACEA FLUFF 4 (GAUZE/BANDAGES/DRESSINGS) IMPLANT
CANISTER SUCTION 3000ML PPV (SUCTIONS) ×1 IMPLANT
CANISTER WOUNDNEG PRESSURE 500 (CANNISTER) IMPLANT
CLIP LIGATING EXTRA MED SLVR (CLIP) ×1 IMPLANT
CLIP LIGATING EXTRA SM BLUE (MISCELLANEOUS) ×1 IMPLANT
COVER SURGICAL LIGHT HANDLE (MISCELLANEOUS) ×1 IMPLANT
DRAPE HALF SHEET 40X57 (DRAPES) IMPLANT
DRAPE U-SHAPE 76X120 STRL (DRAPES) IMPLANT
DRESSING VERAFLO CLEANSE CC (GAUZE/BANDAGES/DRESSINGS) IMPLANT
ELECTRODE REM PT RTRN 9FT ADLT (ELECTROSURGICAL) ×1 IMPLANT
GAUZE SPONGE 4X4 12PLY STRL (GAUZE/BANDAGES/DRESSINGS) ×1 IMPLANT
GAUZE XEROFORM 5X9 LF (GAUZE/BANDAGES/DRESSINGS) IMPLANT
GLOVE BIO SURGEON STRL SZ7.5 (GLOVE) ×1 IMPLANT
GOWN STRL REUS W/ TWL LRG LVL3 (GOWN DISPOSABLE) ×2 IMPLANT
GOWN STRL REUS W/ TWL XL LVL3 (GOWN DISPOSABLE) ×1 IMPLANT
GRAFT SKIN WND MICRO 38 (Tissue) IMPLANT
IV NS IRRIG 3000ML ARTHROMATIC (IV SOLUTION) ×1 IMPLANT
KIT BASIN OR (CUSTOM PROCEDURE TRAY) ×1 IMPLANT
KIT STIMULAN RAPID CURE 5CC (Orthopedic Implant) IMPLANT
KIT TURNOVER KIT B (KITS) ×1 IMPLANT
NS IRRIG 1000ML POUR BTL (IV SOLUTION) ×1 IMPLANT
PACK CV ACCESS (CUSTOM PROCEDURE TRAY) IMPLANT
PACK GENERAL/GYN (CUSTOM PROCEDURE TRAY) ×1 IMPLANT
PACK UNIVERSAL I (CUSTOM PROCEDURE TRAY) ×1 IMPLANT
PAD ARMBOARD POSITIONER FOAM (MISCELLANEOUS) ×2 IMPLANT
PAD NEG PRESSURE SENSATRAC (MISCELLANEOUS) IMPLANT
POWDER SURGICEL 3.0 GRAM (HEMOSTASIS) IMPLANT
SET HNDPC FAN SPRY TIP SCT (DISPOSABLE) IMPLANT
STAPLER SKIN PROX 35W (STAPLE) IMPLANT
SUT ETHILON 3 0 PS 1 (SUTURE) IMPLANT
SUT PROLENE 5 0 C1 (SUTURE) IMPLANT
SUT SILK 2-0 18XBRD TIE 12 (SUTURE) IMPLANT
SUT SILK 3-0 18XBRD TIE 12 (SUTURE) IMPLANT
SUT VIC AB 2-0 CT1 TAPERPNT 27 (SUTURE) IMPLANT
SUT VIC AB 2-0 CTX 36 (SUTURE) IMPLANT
SUT VIC AB 3-0 SH 27X BRD (SUTURE) IMPLANT
SUT VIC AB 4-0 PS2 18 (SUTURE) IMPLANT
SWAB CULTURE ESWAB REG 1ML (MISCELLANEOUS) IMPLANT
SWAB CULTURE LIQ STUART DBL (MISCELLANEOUS) IMPLANT
SYR 5ML LL (SYRINGE) IMPLANT
TIP FAN IRRIG PULSAVAC PLUS (DISPOSABLE) IMPLANT
TOWEL GREEN STERILE (TOWEL DISPOSABLE) ×1 IMPLANT
WATER STERILE IRR 1000ML POUR (IV SOLUTION) ×1 IMPLANT

## 2023-08-26 NOTE — Transfer of Care (Signed)
 Immediate Anesthesia Transfer of Care Note  Patient: [name]  Procedure(s) Performed: IRRIGATION AND DEBRIDEMENT WOUND (Left)  Patient Location: PACU  Anesthesia Type:General  Level of Consciousness: awake, alert , and oriented  Airway & Oxygen Therapy: Patient Spontanous Breathing  Post-op Assessment: Report given to RN, Post -op Vital signs reviewed and stable, and Patient moving all extremities  Post vital signs: Reviewed and stable  Last Vitals:  Vitals Value Taken Time  BP 135/66 08/26/23 0900  Temp 98   Pulse 98 08/26/23 0902  Resp 12   SpO2 91 % 08/26/23 0902  Vitals shown include unfiled device data.  Last Pain:  Vitals:   08/26/23 0629  TempSrc: Oral  PainSc: 9          Complications: There were no known notable events for this encounter.

## 2023-08-26 NOTE — Anesthesia Postprocedure Evaluation (Signed)
 Anesthesia Post Note  Patient: [name]  Procedure(s) Performed: IRRIGATION AND DEBRIDEMENT WOUND (Left)     Patient location during evaluation: PACU Anesthesia Type: General Level of consciousness: awake and alert, patient cooperative and oriented Pain management: pain level controlled Vital Signs Assessment: post-procedure vital signs reviewed and stable Respiratory status: spontaneous breathing, nonlabored ventilation, respiratory function stable and patient connected to nasal cannula oxygen Cardiovascular status: blood pressure returned to baseline and stable Postop Assessment: no apparent nausea or vomiting Anesthetic complications: no   There were no known notable events for this encounter.  Last Vitals:  Vitals:   08/26/23 0900 08/26/23 0915  BP: 135/66 127/63  Pulse: (!) 103 (!) 101  Resp:  17  Temp: 36.8 C   SpO2: 94% 90%    Last Pain:  Vitals:   08/26/23 0900  TempSrc:   PainSc: 0-No pain                 Malicia Blasdel,E. Nima Bamburg

## 2023-08-26 NOTE — Progress Notes (Signed)
  Progress Note    08/26/2023 7:17 AM * Day of Surgery *  Subjective:  still having left groin pain  Vitals:   08/26/23 0510 08/26/23 0629  BP: (!) 122/59 114/63  Pulse: 95 97  Resp: (!) 21 19  Temp:    SpO2: 98%     Physical Exam: Aaox3 Non labored respirations Left groin with erythema  CBC    Component Value Date/Time   WBC 11.5 (H) 08/26/2023 0450   RBC 3.79 (L) 08/26/2023 0450   HGB 10.7 (L) 08/26/2023 0450   HGB 14.1 03/13/2023 1003   HCT 33.7 (L) 08/26/2023 0450   HCT 44.6 03/13/2023 1003   PLT 330 08/26/2023 0450   PLT 208 03/13/2023 1003   MCV 88.9 08/26/2023 0450   MCV 86 03/13/2023 1003   MCH 28.2 08/26/2023 0450   MCHC 31.8 08/26/2023 0450   RDW 15.4 08/26/2023 0450   RDW 13.9 03/13/2023 1003   LYMPHSABS 1.7 08/21/2023 1610   LYMPHSABS 1.5 03/13/2023 1003   MONOABS 1.1 (H) 08/21/2023 1610   EOSABS 0.0 08/21/2023 1610   EOSABS 0.1 03/13/2023 1003   BASOSABS 0.0 08/21/2023 1610   BASOSABS 0.0 03/13/2023 1003    BMET    Component Value Date/Time   NA 136 08/26/2023 0450   NA 140 03/13/2023 1003   K 4.9 08/26/2023 0450   CL 105 08/26/2023 0450   CO2 19 (L) 08/26/2023 0450   GLUCOSE 193 (H) 08/26/2023 0450   BUN 26 (H) 08/26/2023 0450   BUN 13 03/13/2023 1003   CREATININE 1.62 (H) 08/26/2023 0450   CREATININE 1.20 09/10/2013 0513   CALCIUM  8.8 (L) 08/26/2023 0450   GFRNONAA 43 (L) 08/26/2023 0450   GFRAA 82 02/09/2018 0721    INR    Component Value Date/Time   INR 2.2 (H) 08/25/2023 1633     Intake/Output Summary (Last 24 hours) at 08/26/2023 0717 Last data filed at 08/26/2023 0515 Gross per 24 hour  Intake 523.59 ml  Output 950 ml  Net -426.41 ml     Assessment/plan:  78 y.o. male is here with apparent infection of left to right fem-fem bypass. Plan washout today in OR.      Sheretta Grumbine C. Vikki Graves, MD [facility] Office: [phone] Pager: [phone]  08/26/2023 7:17 AM

## 2023-08-26 NOTE — Progress Notes (Signed)
 Pharmacy Antibiotic Note  [name] is a 78 y.o. male admitted on 08/25/2023 with L groin infection with possible graft involvement after recent RLE angiogram, thrombectomy, balloon angioplasty.  Pharmacy has been consulted for vancomycin and piperacillin /tazobactam dosing.  Patient's Tmax 101.2, WBC improved 13.5 > 11.5, and s/p wash out on 5/10 w/ plans for return to the operating room early next week  Plan: Decrease vancomycin to 1000mg  IV q24hr (eAUC 516 w/ Scr 1.62 and Vd 0.72)  Continue piperacillin /tazobactam 3.375g q8hr Monitor cultures, clinical status, renal function, vancomycin level Narrow abx as able and f/u duration     Temp (24hrs), Avg:99.4 F (37.4 C), Min:98.2 F (36.8 C), Max:101.2 F (38.4 C)  Recent Labs  Lab 08/21/23 1322 08/21/23 1610 08/25/23 1633 08/26/23 0450  WBC  --  11.8* 13.5* 11.5*  CREATININE 1.20  --  1.68* 1.62*    Estimated Creatinine Clearance: 38.1 mL/min (A) (by C-G formula based on SCr of 1.62 mg/dL (H)).    Allergies  Allergen Reactions   Lisinopril  Swelling and Rash    Rash - face and tounge swell   Levaquin [Levofloxacin]     Antimicrobials this admission: vanc 5/9 >>  piptazo 5/9 >>   Microbiology results: 5/10 wound cx:  5/10 MRSA PCR pos  Thank you for allowing pharmacy to be a part of this patient's care.  Adaline Ada, PharmD PGY1 Pharmacy Resident 08/26/2023 10:11 AM   Please check AMION for all [facility] phone numbers After 10:00 PM, call Main Pharmacy [phone]

## 2023-08-26 NOTE — Progress Notes (Signed)
 Patient received from PACU, AO x4, vitals stable at the time of arrival. CHG completed, connected to tele and CCMD notified. Oriented pt to room and call bell system. Call bell within reach, plan of care continues.

## 2023-08-26 NOTE — Op Note (Signed)
    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 Vikki Graves, MD Procedure Performed: 1.  Washout of left groin wound with 1 L saline and 1 L of Vashe using Pulsavac 2.  Application of vancomycin/gentamicin  antibiotic beads 3.  Application 38 cm Kerecis 4.  Partial closure of left groin wound with wound VAC placement totaling 4 x 2 cm 5.  Primary repair of graftotomy   Indications: 78 year old male recent underwent angiography to evaluate for right lower extremity ischemia and ultimately had mechanical thrombectomy of the right SFA and anterior tibial arteries with drug-coated balloon angioplasty of the right SFA.  He now follows up approximately 6 months later with acute left groin pain with ultrasound evidence of fluid around the graft.  He is now indicated for operative exploration.  Findings: There is approximately 25 cc of frank purulence around the graft.  The aortobifemoral graft was fully incorporated.  The femorofemoral bypass graft was surrounded with purulence proximally but appeared to be incorporated towards the right groin.  The graftotomy did bleed with manipulation and this was primarily repaired with 5-0 Prolene suture.  I placed antibiotic beads as well as for skin on top of the graft with a wound VAC with plans for return to the operating room early next week.   Procedure:  The patient was identified in the holding area and taken to the operating room where he was placed supine operative table and general anesthesia was induced.  He was sterilely prepped and draped the bilateral groins and lower abdomen in the usual fashion, antibiotics were administered and timeout was called.  I began by reopening his previous left groin incision.  I dissected down and ultimately encountered frank purulence and cultures were sent.  This  was then washed out with irrigation fluid.  I manipulated the graft and there was frank bleeding for the previous graftotomy and this was repaired with 5-0 Prolene suture in figure-of-eight fashion.  I then washed out the wound using 1 L of saline and 1 L of Vashe with Pulsavac.  The graft was noted to be incorporated towards the midline and also the aortobifemoral graft was fully incorporated.  We then placed antibiotic beads using vancomycin and gentamicin  overlying the graft and applied 38 cm of Kerecis and a wound VAC was fashioned in place.  I did close the distal portion of the wound using interrupted 2-0 Vicryl suture followed by staples at the skin level.  Patient was then awakened from anesthesia having tolerated the procedure well without any complication.  All counts were correct at completion.  EBL: 200 cc   Specimen: Aerobic anaerobic culture     Shiraz Bastyr C. Vikki Graves, MD [facility] [place] Office: [phone] Pager: [phone]

## 2023-08-27 ENCOUNTER — Inpatient Hospital Stay (HOSPITAL_COMMUNITY)

## 2023-08-27 DIAGNOSIS — N179 Acute kidney failure, unspecified: Secondary | ICD-10-CM | POA: Diagnosis not present

## 2023-08-27 DIAGNOSIS — I5022 Chronic systolic (congestive) heart failure: Secondary | ICD-10-CM | POA: Diagnosis not present

## 2023-08-27 DIAGNOSIS — T827XXA Infection and inflammatory reaction due to other cardiac and vascular devices, implants and grafts, initial encounter: Secondary | ICD-10-CM | POA: Diagnosis not present

## 2023-08-27 DIAGNOSIS — E1165 Type 2 diabetes mellitus with hyperglycemia: Secondary | ICD-10-CM | POA: Diagnosis not present

## 2023-08-27 DIAGNOSIS — Z794 Long term (current) use of insulin: Secondary | ICD-10-CM

## 2023-08-27 LAB — BASIC METABOLIC PANEL WITH GFR
Anion gap: 11 (ref 5–15)
BUN: 33 mg/dL — ABNORMAL HIGH (ref 8–23)
CO2: 18 mmol/L — ABNORMAL LOW (ref 22–32)
Calcium: 8.9 mg/dL (ref 8.9–10.3)
Chloride: 105 mmol/L (ref 98–111)
Creatinine, Ser: 1.62 mg/dL — ABNORMAL HIGH (ref 0.61–1.24)
GFR, Estimated: 43 mL/min — ABNORMAL LOW (ref >60)
Glucose, Bld: 265 mg/dL — ABNORMAL HIGH (ref 70–99)
Potassium: 4.9 mmol/L (ref 3.5–5.1)
Sodium: 134 mmol/L — ABNORMAL LOW (ref 135–145)

## 2023-08-27 LAB — GLUCOSE, CAPILLARY
Glucose-Capillary: 285 mg/dL — ABNORMAL HIGH (ref 70–99)
Glucose-Capillary: 275 mg/dL — ABNORMAL HIGH (ref 70–99)
Glucose-Capillary: 302 mg/dL — ABNORMAL HIGH (ref 70–99)
Glucose-Capillary: 310 mg/dL — ABNORMAL HIGH (ref 70–99)
Glucose-Capillary: 132 mg/dL — ABNORMAL HIGH (ref 70–99)

## 2023-08-27 MED ORDER — INSULIN ASPART 100 UNIT/ML IJ SOLN
8.0000 [IU] | Freq: Three times a day (TID) | INTRAMUSCULAR | Status: DC
  Administered 2023-08-27: 8 [IU] via SUBCUTANEOUS

## 2023-08-27 MED ORDER — INSULIN ASPART 100 UNIT/ML IJ SOLN
0.0000 [IU] | Freq: Three times a day (TID) | INTRAMUSCULAR | Status: DC
  Administered 2023-08-27: 11 [IU] via SUBCUTANEOUS
  Administered 2023-08-27 (×2): 15 [IU] via SUBCUTANEOUS
  Administered 2023-08-28: 3 [IU] via SUBCUTANEOUS
  Administered 2023-08-28 (×2): 4 [IU] via SUBCUTANEOUS
  Administered 2023-08-29: 3 [IU] via SUBCUTANEOUS
  Administered 2023-08-29: 20 [IU] via SUBCUTANEOUS
  Administered 2023-08-30: 4 [IU] via SUBCUTANEOUS
  Administered 2023-08-30: 11 [IU] via SUBCUTANEOUS
  Administered 2023-08-31: 4 [IU] via SUBCUTANEOUS
  Administered 2023-08-31: 7 [IU] via SUBCUTANEOUS
  Administered 2023-09-01 (×2): 3 [IU] via SUBCUTANEOUS

## 2023-08-27 MED ORDER — INSULIN ASPART 100 UNIT/ML IJ SOLN
12.0000 [IU] | Freq: Three times a day (TID) | INTRAMUSCULAR | Status: DC
  Administered 2023-08-27 – 2023-09-01 (×11): 12 [IU] via SUBCUTANEOUS

## 2023-08-27 NOTE — Progress Notes (Addendum)
 Progress Note    08/27/2023 6:34 AM 1 Day Post-Op  Subjective:  says he didn't get any sleep last night.   A little sore in his groins.  He's not sure why his sugar was so high last night.  Afebrile HR 80's-100's  100's-120's systolic 98% RA  Vitals:   08/27/23 0027 08/27/23 0411  BP: (!) 119/59 (!) 115/59  Pulse: 96 86  Resp: 18 18  Temp: 97.7 F (36.5 C) 97.9 F (36.6 C)  SpO2: 98% 98%    Physical Exam: General:  no distress Cardiac:  regular Lungs:   non labored Incisions:  left groin with vac with good seal Extremities:  palpable pedal pulses   CBC    Component Value Date/Time   WBC 11.5 (H) 08/26/2023 0450   RBC 3.79 (L) 08/26/2023 0450   HGB 10.7 (L) 08/26/2023 0450   HGB 14.1 03/13/2023 1003   HCT 33.7 (L) 08/26/2023 0450   HCT 44.6 03/13/2023 1003   PLT 330 08/26/2023 0450   PLT 208 03/13/2023 1003   MCV 88.9 08/26/2023 0450   MCV 86 03/13/2023 1003   MCH 28.2 08/26/2023 0450   MCHC 31.8 08/26/2023 0450   RDW 15.4 08/26/2023 0450   RDW 13.9 03/13/2023 1003   LYMPHSABS 1.7 08/21/2023 1610   LYMPHSABS 1.5 03/13/2023 1003   MONOABS 1.1 (H) 08/21/2023 1610   EOSABS 0.0 08/21/2023 1610   EOSABS 0.1 03/13/2023 1003   BASOSABS 0.0 08/21/2023 1610   BASOSABS 0.0 03/13/2023 1003    BMET    Component Value Date/Time   NA 136 08/26/2023 0450   NA 140 03/13/2023 1003   K 4.9 08/26/2023 0450   CL 105 08/26/2023 0450   CO2 19 (L) 08/26/2023 0450   GLUCOSE 193 (H) 08/26/2023 0450   BUN 26 (H) 08/26/2023 0450   BUN 13 03/13/2023 1003   CREATININE 1.62 (H) 08/26/2023 0450   CREATININE 1.20 09/10/2013 0513   CALCIUM  8.8 (L) 08/26/2023 0450   GFRNONAA 43 (L) 08/26/2023 0450   GFRAA 82 02/09/2018 0721    INR    Component Value Date/Time   INR 2.2 (H) 08/25/2023 1633     Intake/Output Summary (Last 24 hours) at 08/27/2023 0634 Last data filed at 08/27/2023 0300 Gross per 24 hour  Intake 1483.05 ml  Output 1000 ml  Net 483.05 ml        Component Ref Range & Units (hover) 1 d ago  Specimen Description WOUND  Special Requests LEFT GROIN SWAB  Gram Stain FEW WBC PRESENT, PREDOMINANTLY PMN FEW GRAM POSITIVE COCCI  Culture CULTURE REINCUBATED FOR BETTER GROWTH Performed at [facility], [address], [place], [place] [address]  Report Status PENDING     Assessment/Plan:  78 y.o. male is s/p:  Washout left left groin, primary repair of graftotomy, application of Kerecis, partial closure and vac placement on 08/26/2023 by Dr. Vikki Graves 1 Day Post-Op   -pt with palpable pedal pulses and wound vac with good seal -culture left groin - gram stain with few GPC and was re-incubated for better growth.  Continue broad spectrum abx until culture results.  Plan to return to OR on Tuesday.   -RN notes PA paged last evening with no response and paged again an hour later.  I received one page from RN and order for insulin  was given.  Pt's glucose this am is 285-increase SSI to obese/resistant.  Consult to hospitalist-discontinue Jardiance  for now given creatinine and will add Novalog 8U with  meals.  He will be by to see pt later this morning or early afternoon.  Appreciate assistance from [facility]. -DVT prophylaxis:  sq heparin .  Pt on Xarelto  PTA.  This is on hold for OR.   -ok to mobilize out of bed.   Maryanna Smart, PA-C [facility] [phone] 08/27/2023 6:34 AM   I have independently interviewed and examined patient and agree with PA assessment and plan above.  Continue broad-spectrum antibiotics until speciation currently only gram-positive cocci.  Plan for return trip to the OR this week.  Arvetta Araque C. Vikki Graves, MD [facility] [place] Office: [phone] Pager: [phone]

## 2023-08-27 NOTE — Hospital Course (Signed)
 [name]

## 2023-08-27 NOTE — Plan of Care (Signed)

## 2023-08-27 NOTE — Plan of Care (Signed)
   Problem: Education: Goal: Knowledge of General Education information will improve Description Including pain rating scale, medication(s)/side effects and non-pharmacologic comfort measures Outcome: Progressing

## 2023-08-27 NOTE — Consult Note (Signed)
 Initial Consultation Note   Patient: [name] WJX:[id number] DOB: 02/22/1946 PCP: Wayne Haines, MD DOA: 08/25/2023 DOS: the patient was seen and examined on 08/27/2023 Primary service: Shera Diego*  Referring physician: Angela Kell, MD Reason for consult: hyperglycemia  Assessment/Plan: Assessment and Plan: No notes have been filed under this hospital service. Service: Hospitalist  Uncontrolled T2DM with hyperglycemia A1c 10.6% on 06/30/2023. Patient on lantus  35u daily and jardiance  10mg  daily at home. CBGs have remained elevated over the past day, ranging in the 270s-440s. He was given a dose of semglee  35u yesterday night with improvement in CBG from 449 to 285 this AM. CBG 270s-300 this AM. It does appear that his long-acting insulin  dose is appropriate given appropriate decrease. Will add meal-time insulin  of 8 units and have changed his SSI to resistant dosing. May need to further adjust dosing pending further CBG trends. -continue semglee  35u at bedtime -resistant SSI -added novolog  8u TID with meals -trend CBGs, goal 140-180  Infected fem-fem bypass graft PAD s/p left groin washout, primary repair of graftotomy, application of kerecis, partial closure and vac placement (08/26/23, Dr. Vikki Graves) -s/p right SFA and AT mechanical thrombectomy and right SFA drug-coated balloon angioplasty on 07/10/2023 -presented to ED this past Monday for new onset severe left groin pain -CTA revealed stranding around left groin suggestive of inflammation versus infection -pain did not improve after discharge and patient seen by vascular surgery on 5/9 -Ultrasound showed fluid surrounding proximal portion of left to right fem-fem bypass (not evident on prior CTA), concerning for infection at left groin cath site and concern for infection involving pre-existing fem-fem bypass graft -patient admitted by VVS, taken for left groin washout yesterday -plan to return to OR on Tuesday -left groin  culture /gram stain with few GPC, reincubated for better growth -on broad spectrum abx with vanc/zosyn  until cultures result -management per primary service  Mild AKI on CKD 3A -baseline Cr ~1.20-1.35 -on admission, Cr 1.68 -management per primary team -took the liberty to stop home entresto  and jardiance  given AKI -trend urine output, kidney function -f/u renal ultrasound to rule out obstructive physiology  HTN -patient on norvasc , entresto , coreg , imdur at home -BP ranging in the 100s-110s/60s today -holding entresto  given AKI -would recommend holding home norvasc  and imdur given mild hypotension but will defer to VVS -management per VVS   CAD s/p PTCA/DES to proximal LAD -on aspirin , coreg , imdur -mgmt per primary  Chronic HFrEF -follows cardiology in the outpatient setting -Last ECHO with LVEF 30-35% -on entresto , coreg , jardiance  at home -stopped entresto  and jardiance  given AKI  pAF -home xarelto  currently on hold pending VVS management -patient on heparin  SQ for dvt ppx dosing -telemetry  HLD -continue crestor  and zetia   GERD -continue PPI  BPH -continue flomax   TRH will continue to follow the patient.  HPI: [name] is a 78 y.o. male with past medical history of PAD s/p fem-fem bypass, T2DM, HTN, CAD s/p PTCA/DES to proximal LAD, chronic HFrEF, paroxysmal atrial fibrillation on xarelto , HLD, GERD, BPH, CKD 3A who presented for left groin pain.  Patient with history of PAD s/p right SFA and AT mechanical thrombectomy and right SFA drug-coated balloon angioplasty on 07/10/2023. Presented to ED this past Monday (5/5) for new onset severe left groin pain. CTA revealed stranding around left groin suggestive of inflammation versus infection. Patient subsequently discharged from ED. Pain did not improve after discharge and patient seen by vascular surgery on 5/9. Ultrasound showed fluid surrounding  proximal portion of left to right fem-fem bypass (not evident on  prior CTA), concerning for infection at left groin cath site and concern for infection involving pre-existing fem-fem bypass graft. Patient ultimately admitted by VVS for further evaluation and care.  TRH consulted today to assist with management of hyperglycemia.  Review of Systems: As mentioned in the history of present illness. All other systems reviewed and are negative. Past Medical History:  Diagnosis Date   Acute low back pain 01/12/2015   Acute lower limb ischemia 07/11/2023   Acute pain of both shoulders 07/22/2022   AKI (acute kidney injury) (HCC) 05/24/2022   Arthritis    Atherosclerosis of native arteries of the extremities with intermittent claudication 08/13/2013   Atherosclerosis of native artery of extremity with intermittent claudication (HCC) 08/13/2013   IMO SNOMED Dx Update Oct 2024     Backache 02/08/2013   Benign prostatic hyperplasia with urinary frequency 02/23/2023   Bladder cancer (HCC)    resection x3   BMI 27.0-27.9,adult 02/23/2023   Cellulitis 07/17/2023   Chronic combined systolic (congestive) and diastolic (congestive) heart failure (HCC) 07/22/2022   Chronic left shoulder pain 05/24/2023   Chronic right shoulder pain 09/02/2022   Chronic systolic CHF (congestive heart failure) (HCC)    CKD stage 3b, GFR 30-44 ml/min (HCC) 07/03/2023   Coronary artery disease    status post DMI RX Taxus stent RCA 2006 with susequent Stent LAD and subsequent  stent thrombosis RCA unable to be opened 2006 -neg mv 10/2008, 10/16/17 ISR to pLAD with PTCA/DES, CTO of RCA with collaterals, EF 25%   Coronary artery disease involving native coronary artery of native heart with unstable angina pectoris (HCC)    Current moderate episode of major depressive disorder without prior episode (HCC) 03/28/2022   Diverticulitis 06/10/2022   Diverticulitis large intestine 05/11/2021   Diverticulitis of large intestine with abscess 05/24/2022   Diverticulitis of sigmoid colon 05/11/2021    DM2 (diabetes mellitus, type 2) (HCC) 02/09/2022   status post bilateral aortobifemoral bypass [id number] with recent fem to fembypass April  2011 per DR. Early     Dyssynergic defecation 03/21/2023   Essential hypertension 07/22/2022   Fatigue 03/13/2023   History of bladder cancer 02/23/2023   History of colonic diverticulitis 03/21/2023   Hypercholesterolemia 10/28/2022   HYPERLIPIDEMIA-MIXED 09/22/2008   Qualifier: Diagnosis of  By: Malon Seamen     Hypothyroidism 05/24/2022   Ischemic cardiomyopathy    ejection fraction of 40-45%   Lethargy 03/28/2022   Lumbar spinal stenosis    Myocardial infarction (HCC)    "I've had 4" (10/16/2017)   Need for prophylactic vaccination and inoculation against influenza 03/28/2022   NSVT (nonsustained ventricular tachycardia) (HCC)    PAD (peripheral artery disease) (HCC) 07/17/2023   PAF (paroxysmal atrial fibrillation) (HCC) 02/09/2022   Paroxysmal atrial fibrillation (HCC)    Peripheral artery disease (HCC) 07/09/2023   Poorly controlled T2 diabetes mellitus (HCC) 09/27/2022   Protein-calorie malnutrition, severe 05/29/2022   PVC's (premature ventricular contractions)    PVD 09/22/2008   Qualifier: Diagnosis of  By: Jaramillo, Luz     PVD (peripheral vascular disease) (HCC) 02/23/2023   Rotator cuff arthropathy of right shoulder 09/27/2022   Sigmoid diverticulitis 06/30/2023   Status post coronary artery stent placement    Status post Hartmann's procedure (HCC) 03/21/2023   TOBACCO ABUSE 05/29/2009   Qualifier: Diagnosis of  By: Emma Hardy RN, BSN, Levern Reader    Trigger ring finger of right hand 03/29/2023   Type  2 diabetes mellitus with other circulatory complications (HCC) 07/22/2022   Type II diabetes mellitus (HCC)    Unstable angina (HCC) 10/16/2017   Past Surgical History:  Procedure Laterality Date   ABDOMINAL AORTOGRAM W/LOWER EXTREMITY Right 07/10/2023   Procedure: ABDOMINAL AORTOGRAM W/LOWER EXTREMITY;  Surgeon: Philipp Brawn,  MD;  Location: [facility] INVASIVE CV LAB;  Service: Cardiovascular;  Laterality: Right;   AORTA - BILATERAL FEMORAL ARTERY BYPASS GRAFT Bilateral 1992   APPENDECTOMY     BACK SURGERY     COLECTOMY WITH COLOSTOMY CREATION/HARTMANN PROCEDURE N/A 05/26/2022   Procedure: COLECTOMY WITH COLOSTOMY CREATION/HARTMANN PROCEDURE;  Surgeon: Anda Bamberg, MD;  Location: [facility] OR;  Service: General;  Laterality: N/A;   CORONARY ANGIOPLASTY WITH STENT PLACEMENT     taxus stent  placed into his right coronary artery; stent placed to the LAD.     CORONARY STENT INTERVENTION N/A 10/16/2017   Procedure: CORONARY STENT INTERVENTION;  Surgeon: Odie Benne, MD;  Location: [facility] INVASIVE CV LAB;  Service: Cardiovascular;  Laterality: N/A;   FEMORAL-FEMORAL BYPASS GRAFT  07/2009   LAPAROTOMY N/A 05/26/2022   Procedure: EXPLORATORY LAPAROTOMY;  Surgeon: Anda Bamberg, MD;  Location: [facility] OR;  Service: General;  Laterality: N/A;   LOWER EXTREMITY INTERVENTION Right 07/10/2023   Procedure: LOWER EXTREMITY INTERVENTION;  Surgeon: Philipp Brawn, MD;  Location: [facility] INVASIVE CV LAB;  Service: Cardiovascular;  Laterality: Right;   LUMBAR MICRODISCECTOMY  12/31/08   LYSIS OF ADHESION N/A 06/30/2023   Procedure: LAPAROSCOPIC LYSIS OF ADHESIONS;  Surgeon: Candyce Champagne, MD;  Location: [facility] ORS;  Service: General;  Laterality: N/A;   PERIPHERAL VASCULAR THROMBECTOMY Right 07/10/2023   Procedure: PERIPHERAL VASCULAR THROMBECTOMY;  Surgeon: Philipp Brawn, MD;  Location: [facility] INVASIVE CV LAB;  Service: Cardiovascular;  Laterality: Right;   PROCTOSCOPY N/A 06/30/2023   Procedure: Flexible sigmoidoscopy;  Surgeon: Candyce Champagne, MD;  Location: [facility] ORS;  Service: General;  Laterality: N/A;   RIGHT/LEFT HEART CATH AND CORONARY ANGIOGRAPHY N/A 10/16/2017   Procedure: RIGHT/LEFT HEART CATH AND CORONARY ANGIOGRAPHY;  Surgeon: Odie Benne, MD;  Location: [facility] INVASIVE CV LAB;  Service: Cardiovascular;  Laterality: N/A;   SHOULDER OPEN  ROTATOR CUFF REPAIR Left    XI ROBOTIC ASSISTED COLOSTOMY TAKEDOWN N/A 06/30/2023   Procedure: XI ROBOTIC ASSISTED OSTOMY TAKEDOWN, PARTIAL RECTOSIGMOID RESECTION, BILATERAL TAP BLOCK;  Surgeon: Candyce Champagne, MD;  Location: [facility] ORS;  Service: General;  Laterality: N/A;   Social History:  reports that he quit smoking about 3 years ago. His smoking use included cigars and cigarettes. He started smoking about 59 years ago. He has a 28 pack-year smoking history. He has quit using smokeless tobacco.  His smokeless tobacco use included chew. He reports current alcohol use. He reports that he does not use drugs.  Allergies  Allergen Reactions   Lisinopril  Swelling and Rash    Rash - face and tounge swell   Levaquin [Levofloxacin]     Family History  Problem Relation Age of Onset   Coronary artery disease Unknown    Diabetes Unknown    Cardiomyopathy Mother    Heart disease Mother    Hyperlipidemia Mother    Coronary artery disease Father    Stroke Father    Diabetes Father    Heart disease Father        before age 87   Hyperlipidemia Father    Heart attack Father    Diabetes Sister    Heart disease Sister  before age 71   Hyperlipidemia Sister    Heart attack Sister     Prior to Admission medications   Medication Sig Start Date End Date Taking? Authorizing Provider  amLODipine  (NORVASC ) 5 MG tablet Take 1 tablet (5 mg total) by mouth daily. 09/21/22  Yes Krasowski, Robert J, MD  aspirin  EC 81 MG tablet Take 1 tablet (81 mg total) by mouth daily. Swallow whole. 05/10/21  Yes Odie Benne, MD  carvedilol  (COREG ) 12.5 MG tablet Take 1 tablet (12.5 mg total) by mouth 2 (two) times daily with a meal. 01/24/23  Yes Manfred Seed, MD  cetirizine  (ZYRTEC ) 10 MG tablet Take 1 tablet (10 mg total) by mouth daily. 02/09/23  Yes Wayne Haines, MD  cyclobenzaprine  (FLEXERIL ) 10 MG tablet Take 1 tablet (10 mg total) by mouth 3 (three) times daily as needed for muscle spasms.  05/24/23  Yes Wayne Haines, MD  ezetimibe  (ZETIA ) 10 MG tablet Take 1 tablet (10 mg total) by mouth daily. 03/01/23 02/24/24 Yes Krasowski, Robert J, MD  insulin  glargine (LANTUS ) 100 unit/mL SOPN 35 units subcut daily 08/16/23  Yes Van Eyk, Jason, MD  isosorbide dinitrate (ISORDIL) 10 MG tablet Take 10 mg by mouth 2 (two) times daily. 08/13/23  Yes [provider]  JARDIANCE  10 MG TABS tablet Take 1 tablet (10 mg total) by mouth daily. 06/05/23  Yes Wayne Haines, MD  loperamide  (IMODIUM  A-D) 2 MG tablet Take 2 mg by mouth 4 (four) times daily as needed for diarrhea or loose stools.   Yes [provider]  nitroGLYCERIN  (NITROSTAT ) 0.4 MG SL tablet Place 0.4 mg under the tongue every 5 (five) minutes as needed for chest pain.   Yes [provider]  rivaroxaban  (XARELTO ) 20 MG TABS tablet Take 1 tablet (20 mg total) by mouth daily with supper. 12/23/22  Yes Wayne Haines, MD  rosuvastatin  (CRESTOR ) 40 MG tablet TAKE 1 TABLET(40 MG) BY MOUTH DAILY 07/31/23  Yes Wayne Haines, MD  sacubitril -valsartan  (ENTRESTO ) 49-51 MG Take 1 tablet by mouth 2 (two) times daily. 06/30/22  Yes Gerald Kitty., NP  tamsulosin  (FLOMAX ) 0.4 MG CAPS capsule Take 0.8 mg by mouth every evening. 03/31/23  Yes [provider]    Physical Exam: Vitals:   08/26/23 1641 08/26/23 1941 08/27/23 0027 08/27/23 0411  BP: 111/67 (!) 114/54 (!) 119/59 (!) 115/59  Pulse: (!) 112 94 96 86  Resp: 18 18 18 18   Temp:  98 F (36.7 C) 97.7 F (36.5 C) 97.9 F (36.6 C)  TempSrc:  Oral Oral Oral  SpO2: 97% 98% 98% 98%   Physical Exam Constitutional:      General: He is not in acute distress. HENT:     Head: Normocephalic and atraumatic.     Mouth/Throat:     Mouth: Mucous membranes are moist.     Pharynx: Oropharynx is clear. No oropharyngeal exudate.  Eyes:     General: No scleral icterus.    Extraocular Movements: Extraocular movements intact.     Conjunctiva/sclera: Conjunctivae normal.      Pupils: Pupils are equal, round, and reactive to light.  Cardiovascular:     Rate and Rhythm: Normal rate and regular rhythm.     Heart sounds: No murmur heard.    No friction rub. No gallop.  Pulmonary:     Effort: Pulmonary effort is normal.     Breath sounds: Normal breath sounds.  Genitourinary:    Comments: Left  groin wound vac in place Skin:    General: Skin is warm and dry.  Neurological:     General: No focal deficit present.     Mental Status: He is alert and oriented to person, place, and time.  Psychiatric:        Mood and Affect: Mood normal.        Behavior: Behavior normal.     Data Reviewed:   There are no new results to review at this time.    Family Communication: updated brother at bedside Primary team communication: discussed with primary care team Thank you very much for involving us  in the care of your patient.  Author: Phuoc Huy H Samarie Pinder, MD 08/27/2023 7:46 AM  For on call review [email or web address].

## 2023-08-27 NOTE — Progress Notes (Signed)
 Attempted to notify PA about pt BG 411, an hour passed, so BG rechecked and it was 449. Paged again, received orders for 15 units of insulin .

## 2023-08-28 ENCOUNTER — Encounter (HOSPITAL_COMMUNITY): Payer: Self-pay | Admitting: Vascular Surgery

## 2023-08-28 DIAGNOSIS — N400 Enlarged prostate without lower urinary tract symptoms: Secondary | ICD-10-CM | POA: Diagnosis present

## 2023-08-28 DIAGNOSIS — N179 Acute kidney failure, unspecified: Secondary | ICD-10-CM | POA: Diagnosis not present

## 2023-08-28 DIAGNOSIS — I5042 Chronic combined systolic (congestive) and diastolic (congestive) heart failure: Secondary | ICD-10-CM

## 2023-08-28 DIAGNOSIS — K219 Gastro-esophageal reflux disease without esophagitis: Secondary | ICD-10-CM | POA: Diagnosis not present

## 2023-08-28 DIAGNOSIS — I10 Essential (primary) hypertension: Secondary | ICD-10-CM

## 2023-08-28 DIAGNOSIS — T827XXD Infection and inflammatory reaction due to other cardiac and vascular devices, implants and grafts, subsequent encounter: Secondary | ICD-10-CM

## 2023-08-28 DIAGNOSIS — I739 Peripheral vascular disease, unspecified: Secondary | ICD-10-CM

## 2023-08-28 DIAGNOSIS — I480 Paroxysmal atrial fibrillation: Secondary | ICD-10-CM

## 2023-08-28 DIAGNOSIS — I25118 Atherosclerotic heart disease of native coronary artery with other forms of angina pectoris: Secondary | ICD-10-CM

## 2023-08-28 DIAGNOSIS — E7800 Pure hypercholesterolemia, unspecified: Secondary | ICD-10-CM | POA: Diagnosis not present

## 2023-08-28 DIAGNOSIS — E119 Type 2 diabetes mellitus without complications: Secondary | ICD-10-CM

## 2023-08-28 DIAGNOSIS — N1832 Chronic kidney disease, stage 3b: Secondary | ICD-10-CM

## 2023-08-28 LAB — URINALYSIS, COMPLETE (UACMP) WITH MICROSCOPIC
Bilirubin Urine: NEGATIVE
Glucose, UA: >=500 mg/dL — AB
Hgb urine dipstick: NEGATIVE
Ketones, ur: NEGATIVE mg/dL
Nitrite: NEGATIVE
Protein, ur: NEGATIVE mg/dL
Specific Gravity, Urine: 1.014 (ref 1.005–1.030)
pH: 5 (ref 5.0–8.0)

## 2023-08-28 LAB — GLUCOSE, CAPILLARY
Glucose-Capillary: 193 mg/dL — ABNORMAL HIGH (ref 70–99)
Glucose-Capillary: 182 mg/dL — ABNORMAL HIGH (ref 70–99)
Glucose-Capillary: 127 mg/dL — ABNORMAL HIGH (ref 70–99)
Glucose-Capillary: 84 mg/dL (ref 70–99)
Glucose-Capillary: 112 mg/dL — ABNORMAL HIGH (ref 70–99)

## 2023-08-28 LAB — BASIC METABOLIC PANEL WITH GFR
Anion gap: 10 (ref 5–15)
BUN: 33 mg/dL — ABNORMAL HIGH (ref 8–23)
CO2: 21 mmol/L — ABNORMAL LOW (ref 22–32)
Calcium: 8.6 mg/dL — ABNORMAL LOW (ref 8.9–10.3)
Chloride: 106 mmol/L (ref 98–111)
Creatinine, Ser: 1.75 mg/dL — ABNORMAL HIGH (ref 0.61–1.24)
GFR, Estimated: 39 mL/min — ABNORMAL LOW (ref >60)
Glucose, Bld: 168 mg/dL — ABNORMAL HIGH (ref 70–99)
Potassium: 4.3 mmol/L (ref 3.5–5.1)
Sodium: 137 mmol/L (ref 135–145)

## 2023-08-28 LAB — CREATININE, URINE, RANDOM: Creatinine, Urine: 61 mg/dL

## 2023-08-28 LAB — SODIUM, URINE, RANDOM: Sodium, Ur: 87 mmol/L

## 2023-08-28 MED ORDER — ISOSORBIDE DINITRATE 10 MG PO TABS
10.0000 mg | ORAL_TABLET | Freq: Two times a day (BID) | ORAL | Status: DC
  Administered 2023-08-29: 10 mg via ORAL
  Filled 2023-08-28 (×2): qty 1

## 2023-08-28 MED ORDER — SODIUM CHLORIDE 0.9 % IV BOLUS
250.0000 mL | Freq: Once | INTRAVENOUS | Status: AC
  Administered 2023-08-28: 250 mL via INTRAVENOUS

## 2023-08-28 MED ORDER — SODIUM CHLORIDE 0.9 % IV SOLN: INTRAVENOUS | Status: AC

## 2023-08-28 NOTE — Progress Notes (Addendum)
 Progress Note    08/28/2023 6:33 AM 2 Days Post-Op  Subjective:  sleeping, wakes easily  Afebrile HR 60's-90's  100's-110's systolic 96% RA  Vitals:   08/27/23 2353 08/28/23 0330  BP: 101/61 111/60  Pulse: 80 79  Resp: 13 20  Temp:  97.6 F (36.4 C)  SpO2: 95% 99%    Physical Exam: General:  no distress and wakes easily Cardiac:  regular Lungs:  non labored Incisions:  left groin with vac in place with good seal Extremities:  faintly palpable left DP pulse and right PT pulse; bilateral feet are warm and well perfused Abdomen:  soft  CBC    Component Value Date/Time   WBC 11.5 (H) 08/26/2023 0450   RBC 3.79 (L) 08/26/2023 0450   HGB 10.7 (L) 08/26/2023 0450   HGB 14.1 03/13/2023 1003   HCT 33.7 (L) 08/26/2023 0450   HCT 44.6 03/13/2023 1003   PLT 330 08/26/2023 0450   PLT 208 03/13/2023 1003   MCV 88.9 08/26/2023 0450   MCV 86 03/13/2023 1003   MCH 28.2 08/26/2023 0450   MCHC 31.8 08/26/2023 0450   RDW 15.4 08/26/2023 0450   RDW 13.9 03/13/2023 1003   LYMPHSABS 1.7 08/21/2023 1610   LYMPHSABS 1.5 03/13/2023 1003   MONOABS 1.1 (H) 08/21/2023 1610   EOSABS 0.0 08/21/2023 1610   EOSABS 0.1 03/13/2023 1003   BASOSABS 0.0 08/21/2023 1610   BASOSABS 0.0 03/13/2023 1003    BMET    Component Value Date/Time   NA 137 08/28/2023 0354   NA 140 03/13/2023 1003   K 4.3 08/28/2023 0354   CL 106 08/28/2023 0354   CO2 21 (L) 08/28/2023 0354   GLUCOSE 168 (H) 08/28/2023 0354   BUN 33 (H) 08/28/2023 0354   BUN 13 03/13/2023 1003   CREATININE 1.75 (H) 08/28/2023 0354   CREATININE 1.20 09/10/2013 0513   CALCIUM  8.6 (L) 08/28/2023 0354   GFRNONAA 39 (L) 08/28/2023 0354   GFRAA 82 02/09/2018 0721    INR    Component Value Date/Time   INR 2.2 (H) 08/25/2023 1633     Intake/Output Summary (Last 24 hours) at 08/28/2023 4098 Last data filed at 08/28/2023 0100 Gross per 24 hour  Intake 200 ml  Output 1200 ml  Net -1000 ml   Component Ref Range & Units  (hover) 2 d ago  Specimen Description WOUND  Special Requests LEFT GROIN SWAB  Gram Stain FEW WBC PRESENT, PREDOMINANTLY PMN FEW GRAM POSITIVE COCCI  Culture FEW STAPHYLOCOCCUS AUREUS SUSCEPTIBILITIES TO FOLLOW Performed at [facility], [address], [place], [place] [address]  Report Status PENDING     Assessment/Plan:  78 y.o. male is s/p:  Washout left left groin, primary repair of graftotomy, application of Kerecis, partial closure and vac placement on 08/26/2023 by Dr. Vikki Graves   2 Days Post-Op   -bilateral feet warm and well perfused.   -wound cx gram stain with few GPC and cx with few staph aureus.  Awaiting susceptibilities. Continue broad spectrum until it results.  -AKI-creatinine worsening at 1.75.  will start gentle hydration given hx of CHF -DVT prophylaxis:  sq heparin .  Xarelto  continues to be on hold for OR -plan for OR tomorrow for washout.  Npo after MN; consent and labs ordered. -appreciate TRH help with management of medical issues.  -hyperglycemia improving   Maryanna Smart, [place] [facility] [phone] 08/28/2023 6:33 AM  I have independently interviewed and examined patient and agree with PA assessment  and plan above.   Lanika Colgate C. Vikki Graves, MD [facility] [place] Office: [phone] Pager: [phone]

## 2023-08-28 NOTE — Plan of Care (Signed)
  Problem: Education: Goal: Knowledge of General Education information will improve Description: Including pain rating scale, medication(s)/side effects and non-pharmacologic comfort measures Outcome: Progressing   Problem: Health Behavior/Discharge Planning: Goal: Ability to manage health-related needs will improve Outcome: Progressing   Problem: Clinical Measurements: Goal: Ability to maintain clinical measurements within normal limits will improve Outcome: Progressing Goal: Will remain free from infection Outcome: Progressing Goal: Diagnostic test results will improve Outcome: Progressing Goal: Respiratory complications will improve Outcome: Progressing Goal: Cardiovascular complication will be avoided Outcome: Progressing   Problem: Activity: Goal: Risk for activity intolerance will decrease Outcome: Progressing   Problem: Nutrition: Goal: Adequate nutrition will be maintained Outcome: Progressing   Problem: Coping: Goal: Level of anxiety will decrease Outcome: Progressing   Problem: Elimination: Goal: Will not experience complications related to bowel motility Outcome: Progressing Goal: Will not experience complications related to urinary retention Outcome: Progressing   Problem: Pain Managment: Goal: General experience of comfort will improve and/or be controlled Outcome: Progressing   Problem: Safety: Goal: Ability to remain free from injury will improve Outcome: Progressing   Problem: Skin Integrity: Goal: Risk for impaired skin integrity will decrease Outcome: Progressing   Problem: Education: Goal: Ability to describe self-care measures that may prevent or decrease complications (Diabetes Survival Skills Education) will improve Outcome: Progressing Goal: Individualized Educational Video(s) Outcome: Progressing   Problem: Coping: Goal: Ability to adjust to condition or change in health will improve Outcome: Progressing   Problem: Fluid  Volume: Goal: Ability to maintain a balanced intake and output will improve Outcome: Progressing   Problem: Health Behavior/Discharge Planning: Goal: Ability to identify and utilize available resources and services will improve Outcome: Progressing Goal: Ability to manage health-related needs will improve Outcome: Progressing   Problem: Nutritional: Goal: Maintenance of adequate nutrition will improve Outcome: Progressing Goal: Progress toward achieving an optimal weight will improve Outcome: Progressing

## 2023-08-28 NOTE — Progress Notes (Signed)
 Consult/PROGRESS NOTE    [name]  ZOX:[id number] DOB: 01-Nov-1945 DOA: 08/25/2023 PCP: Wayne Haines, MD    No chief complaint on file.   Brief Narrative:  [name] is a 78 y.o. male with past medical history of PAD s/p fem-fem bypass, T2DM, HTN, CAD s/p PTCA/DES to proximal LAD, chronic HFrEF, paroxysmal atrial fibrillation on xarelto , HLD, GERD, BPH, CKD 3A who presented for left groin pain.   Patient with history of PAD s/p right SFA and AT mechanical thrombectomy and right SFA drug-coated balloon angioplasty on 07/10/2023. Presented to ED this past Monday (5/5) for new onset severe left groin pain. CTA revealed stranding around left groin suggestive of inflammation versus infection. Patient subsequently discharged from ED. Pain did not improve after discharge and patient seen by vascular surgery on 5/9. Ultrasound showed fluid surrounding proximal portion of left to right fem-fem bypass (not evident on prior CTA), concerning for infection at left groin cath site and concern for infection involving pre-existing fem-fem bypass graft. Patient ultimately admitted by VVS for further evaluation and care.   TRH consulted to assist with management of hyperglycemia.   Assessment & Plan:   Principal Problem:   Infection of vascular bypass graft (HCC) Active Problems:   AKI (acute kidney injury) (HCC)   DM2 (diabetes mellitus, type 2) (HCC)   Essential hypertension   CKD stage 3b, GFR 30-44 ml/min (HCC)   Hypercholesterolemia   Atherosclerosis of native artery of extremity with intermittent claudication (HCC)   Coronary artery disease   Paroxysmal atrial fibrillation (HCC)   PVD (peripheral vascular disease) (HCC)   PAD (peripheral artery disease) (HCC)   Chronic combined systolic (congestive) and diastolic (congestive) heart failure (HCC)   GERD (gastroesophageal reflux disease)   BPH (benign prostatic hyperplasia)  #1 uncontrolled type 2 diabetes mellitus with  hyperglycemia -Hemoglobin A1c 10.6 (06/30/2023). - Patient noted to be on Lantus  35 units daily, Jardiance  10 mg daily at home prior to admission. - CBGs noted initially during the hospitalization to range from 270s to 440s. - Patient placed on Semglee  35 units daily at bedtime with improvement in CBG from 449-285. - CBG noted at 193 this morning. - Continue Semglee  35 units nightly, meal coverage NovoLog  12 units 3 times daily, SSI.  2.  Borderline BP - Patient noted to have soft blood pressure this morning with systolic blood pressures in the low 100s.  Patient noted to have systolic blood pressure of 89/57 overnight likely secondary to acute infection from problem #5. - Discontinue Norvasc . - Hold Isordil. - Will give her 250 cc bolus of normal saline and increase IV fluids to 100 cc/h. -Cultures pending, continue empiric IV antibiotics. - Follow.  3.  Hypertension -Patient noted with soft/borderline blood pressure. -Patient noted to be on Norvasc , Entresto , Coreg , Imdur prior to admission. - Discontinue Norvasc . - Hold Isordil. -Continue to hold home regimen Entresto . -Monitor BP on Coreg . - IV fluids.  4.  AKI on CKD stage IIIa -Baseline creatinine approximately 1.2-1.35. - Creatinine noted on admission as high as 1.68. - Creatinine this morning at 1.75. - Patient noted to be on Entresto  and Jardiance  prior to admission. - Jardiance  and Entresto  discontinued due to AKI. - Likely secondary to prerenal azotemia in the setting of Entresto , Jardiance . - Urine output of 1.2 L over the past 24 hours. - BP soft. - Check a urinalysis, urine sodium, urine creatinine. - Renal ultrasound negative for hydronephrosis, mildly increased cortical echogenicity of both kidneys consistent with  chronic kidney disease. - Give a 250 cc bolus of normal saline. - Increase IV fluids to 100 cc an hour for the next 24 hours. -If worsening renal function may consider discontinuation of vancomycin and  placing on daptomycin in addition to Zosyn . - Repeat labs in the AM.  5.  Infected femorofemoral bypass graft/PAD status post left groin washout, primary repair of graftotomy, application of Kerecis, partial closure and VAC placement 08/26/2023 per Dr. Vikki Graves - Status post right SFA and AT mechanical thrombectomy and right SFA drug-coated balloon angioplasty on 07/10/2023. - Patient presented to the ED with new onset left severe groin pain on Monday, 08/21/2023. - CTA revealed stranding around left groin suggestive of inflammation versus infection. - Patient initially discharged from the ED however no improvement with pain and seen by vascular surgery on 08/25/2023. - Ultrasound done showed fluid surrounding proximal portion of the left to right femorofemoral bypass not evident on CTA, concerning for infection at left groin cath site and concern for infection involving pre-existing femorofemoral bypass graft. - Patient admitted by vascular surgery taking for left groin washout. - Plan is to go back to the OR tomorrow Tuesday, 08/29/2023. - Left groin culture/Gram stain with few GPC recommitted for better growth. - Continue current IV vancomycin and Zosyn  until cultures are finalized and may consider consulting with ID for antibiotic recommendations and duration. - Per primary service.  6.  CAD status post PTCA/DES to proximal LAD -Patient on aspirin , Coreg , Imdur, Entresto , Norvasc  prior to admission. - Due to soft blood pressure and AKI,  Entresto  and Norvasc  discontinued. - Imdur on hold. - Continue Coreg .  7.  Chronic HFrEF -Compensated. - Last 2D echo EF (05/23/2023 ) of 45 to 50%, regional wall motion abnormalities, mild LVH. - Patient with soft blood pressure. - Continue aspirin , Coreg . - Entresto  discontinued due to AKI. - Hold Norvasc  and Imdur.  8.  Paroxysmal A-fib -Currently normal sinus rhythm. - Continue Coreg  for rate control. - Home regimen Xarelto  on hold pending VVS  management. - On heparin  for DVT prophylaxis.  9.  Hyperlipidemia -Crestor , Zetia .  10.  GERD -PPI.  11.  BPH -Flomax .    DVT prophylaxis: Heparin .  Per primary team Code Status: Full Family Communication: Updated patient.  No family at bedside. Disposition: Per primary team  Status is: Inpatient Remains inpatient appropriate because: Severity of illness   Consultants:  [facility] hospitalist: Dr. Consuela Denier 08/27/2023  Procedures:  Renal ultrasound 08/27/2023 Lower extremity arterial Dopplers 08/25/2023 ABI with TBI 08/25/2023  Antimicrobials:  Anti-infectives (From admission, onward)    Start     Dose/Rate Route Frequency Ordered Stop   08/26/23 1800  Vancomycin (VANCOCIN) 1,250 mg in sodium chloride  0.9 % 250 mL IVPB  Status:  Discontinued        1,250 mg 166.7 mL/hr over 90 Minutes Intravenous Every 24 hours 08/25/23 1656 08/26/23 1010   08/26/23 1800  vancomycin (VANCOCIN) IVPB 1000 mg/200 mL premix        1,000 mg 200 mL/hr over 60 Minutes Intravenous Every 24 hours 08/26/23 1010     08/26/23 0916  vancomycin (VANCOCIN) 1,000 mg, gentamicin  (GARAMYCIN ) 80 mg in sodium chloride  irrigation 0.9 % 3,000 mL irrigation  Status:  Discontinued          As needed 08/26/23 0918 08/26/23 0920   08/25/23 1745  vancomycin (VANCOREADY) IVPB 1500 mg/300 mL        1,500 mg 150 mL/hr over 120 Minutes Intravenous  Once 08/25/23 1651 08/25/23  2055   08/25/23 1745  piperacillin -tazobactam (ZOSYN ) IVPB 3.375 g        3.375 g 12.5 mL/hr over 240 Minutes Intravenous Every 8 hours 08/25/23 1651           Subjective: Patient sleeping but arousable.  States he feels sleepy today.  Denies any chest pain or shortness of breath.  No abdominal pain.  Some pain/discomfort in the left groin area.  Tolerating current diet.  Objective: Vitals:   08/28/23 0330 08/28/23 0807 08/28/23 1154 08/28/23 1200  BP: 111/60 110/66 (!) 102/58 109/64  Pulse: 79 80 72 81  Resp: 20  12 15   Temp: 97.6 F (36.4  C) 97.7 F (36.5 C) 97.7 F (36.5 C)   TempSrc: Oral Oral Oral   SpO2: 99% 96% 96% 100%    Intake/Output Summary (Last 24 hours) at 08/28/2023 1518 Last data filed at 08/28/2023 1422 Gross per 24 hour  Intake 1194.47 ml  Output 900 ml  Net 294.47 ml   There were no vitals filed for this visit.  Examination:  General exam: Appears calm and comfortable  Respiratory system: Clear to auscultation anterior lung fields. Respiratory effort normal. Cardiovascular system: S1 & S2 heard, RRR. No JVD, murmurs, rubs, gallops or clicks. No pedal edema. Gastrointestinal system: Abdomen is nondistended, soft and nontender. No organomegaly or masses felt. Normal bowel sounds heard. GU: Left groin with wound VAC. Central nervous system: Alert and oriented. No focal neurological deficits. Extremities: Symmetric 5 x 5 power. Skin: No rashes, lesions or ulcers Psychiatry: Judgement and insight appear normal. Mood & affect appropriate.     Data Reviewed: I have personally reviewed following labs and imaging studies  CBC: Recent Labs  Lab 08/21/23 1610 08/25/23 1633 08/26/23 0450  WBC 11.8* 13.5* 11.5*  NEUTROABS 8.9*  --   --   HGB 12.3* 12.4* 10.7*  HCT 40.0 39.2 33.7*  MCV 92.2 90.1 88.9  PLT 275 341 330    Basic Metabolic Panel: Recent Labs  Lab 08/25/23 1633 08/26/23 0450 08/27/23 0953 08/28/23 0354  NA 137 136 134* 137  K 4.9 4.9 4.9 4.3  CL 104 105 105 106  CO2 19* 19* 18* 21*  GLUCOSE 231* 193* 265* 168*  BUN 25* 26* 33* 33*  CREATININE 1.68* 1.62* 1.62* 1.75*  CALCIUM  9.3 8.8* 8.9 8.6*    GFR: Estimated Creatinine Clearance: 35.3 mL/min (A) (by C-G formula based on SCr of 1.75 mg/dL (H)).  Liver Function Tests: Recent Labs  Lab 08/25/23 1633  AST 17  ALT 10  ALKPHOS 60  BILITOT 0.8  PROT 7.3  ALBUMIN  3.0*    CBG: Recent Labs  Lab 08/27/23 1144 08/27/23 1551 08/27/23 2113 08/28/23 0623 08/28/23 1152  GLUCAP 302* 310* 132* 193* 182*     Recent  Results (from the past 240 hours)  Surgical pcr screen     Status: Abnormal   Collection Time: 08/26/23  5:10 AM   Specimen: Nasal Mucosa; Nasal Swab  Result Value Ref Range Status   MRSA, PCR NEGATIVE NEGATIVE Final   Staphylococcus aureus POSITIVE (A) NEGATIVE Final    Comment: (NOTE) The Xpert SA Assay (FDA approved for NASAL specimens in patients 105 years of age and older), is one component of a comprehensive surveillance program. It is not intended to diagnose infection nor to guide or monitor treatment. Performed at [facility], [address], [place], [place] [address]   Aerobic/Anaerobic Culture w Gram Stain (surgical/deep wound)  Status: None (Preliminary result)   Collection Time: 08/26/23  9:20 AM   Specimen: Wound  Result Value Ref Range Status   Specimen Description WOUND  Final   Special Requests LEFT GROIN SWAB  Final   Gram Stain   Final    FEW WBC PRESENT, PREDOMINANTLY PMN FEW GRAM POSITIVE COCCI Performed at [facility], [address] [place], [place] [address]    Culture   Final    FEW STAPHYLOCOCCUS AUREUS NO ANAEROBES ISOLATED; CULTURE IN PROGRESS FOR 5 DAYS    Report Status PENDING  Incomplete   Organism ID, Bacteria STAPHYLOCOCCUS AUREUS  Final      Susceptibility   Staphylococcus aureus - MIC*    CIPROFLOXACIN <=0.5 SENSITIVE Sensitive     ERYTHROMYCIN <=0.25 SENSITIVE Sensitive     GENTAMICIN  <=0.5 SENSITIVE Sensitive     OXACILLIN 0.5 SENSITIVE Sensitive     TETRACYCLINE <=1 SENSITIVE Sensitive     VANCOMYCIN 1 SENSITIVE Sensitive     TRIMETH/SULFA <=10 SENSITIVE Sensitive     CLINDAMYCIN <=0.25 SENSITIVE Sensitive     RIFAMPIN <=0.5 SENSITIVE Sensitive     Inducible Clindamycin NEGATIVE Sensitive     LINEZOLID 2 SENSITIVE Sensitive     * FEW STAPHYLOCOCCUS AUREUS         Radiology Studies: US  RENAL Result Date: 08/27/2023 CLINICAL DATA:  Acute kidney injury. EXAM: RENAL / URINARY TRACT ULTRASOUND COMPLETE  COMPARISON:  Runoff CTA on 08/21/2023 FINDINGS: Right Kidney: Renal measurements: 9.6 x 5.6 x 6.1 cm = volume: 171 mL. No hydronephrosis. Simple cyst in the upper pole measures up to 2.3 cm and has a benign appearance consistent with a Bosniak 1 cyst. Suggestion of very mild increased cortical echogenicity and atrophy of lower pole cortex. Left Kidney: Renal measurements: 11.9 x 5.9 x 5.5 cm = volume: 204 mL. No hydronephrosis. Small Bosniak 1 cyst of the lower pole measures up to 1.5 cm in greatest diameter. Suggestion of mildly increased cortical echogenicity. Bladder: Appears normal for degree of bladder distention. Other: None. IMPRESSION: 1. No hydronephrosis bilaterally. Suggestion of mildly increased cortical echogenicity of both kidneys consistent with chronic kidney disease. 2. Benign Bosniak 1 cysts of both kidneys. These require no follow-up. Electronically Signed   By: Erica Hau M.D.   On: 08/27/2023 15:49        Scheduled Meds:  aspirin  EC  81 mg Oral Daily   carvedilol   12.5 mg Oral BID WC   Chlorhexidine  Gluconate Cloth  6 each Topical Daily   ezetimibe   10 mg Oral Daily   heparin  injection (subcutaneous)  5,000 Units Subcutaneous Q8H   insulin  aspart  0-20 Units Subcutaneous TID WC   insulin  aspart  12 Units Subcutaneous TID WC   insulin  glargine-yfgn  35 Units Subcutaneous QHS   [START ON 08/29/2023] isosorbide dinitrate  10 mg Oral BID   mupirocin ointment  1 Application Nasal BID   pantoprazole   40 mg Oral Daily   rosuvastatin   40 mg Oral Daily   tamsulosin   0.8 mg Oral QPM   Continuous Infusions:  sodium chloride  100 mL/hr at 08/28/23 1003   piperacillin -tazobactam (ZOSYN )  IV Stopped (08/28/23 1406)   vancomycin Stopped (08/27/23 1720)     LOS: 3 days    Time spent: 40 minutes    Hilda Lovings, MD [facility]   To contact the attending provider between 7A-7P or the covering provider during after hours 7P-7A, please log into the web site  [email or web address] and access  using universal [place] password for that web site. If you do not have the password, please call the hospital operator.  08/28/2023, 3:18 PM

## 2023-08-29 ENCOUNTER — Inpatient Hospital Stay (HOSPITAL_COMMUNITY): Payer: Self-pay

## 2023-08-29 ENCOUNTER — Encounter (HOSPITAL_COMMUNITY): Payer: Self-pay | Admitting: Vascular Surgery

## 2023-08-29 ENCOUNTER — Other Ambulatory Visit: Payer: Self-pay

## 2023-08-29 ENCOUNTER — Encounter (HOSPITAL_COMMUNITY)
Admission: AD | Disposition: A | Discharge status: Home Health | Payer: Self-pay | Source: Ambulatory Visit | Attending: Vascular Surgery

## 2023-08-29 DIAGNOSIS — K219 Gastro-esophageal reflux disease without esophagitis: Secondary | ICD-10-CM | POA: Diagnosis not present

## 2023-08-29 DIAGNOSIS — N179 Acute kidney failure, unspecified: Secondary | ICD-10-CM | POA: Diagnosis not present

## 2023-08-29 DIAGNOSIS — L089 Local infection of the skin and subcutaneous tissue, unspecified: Secondary | ICD-10-CM

## 2023-08-29 DIAGNOSIS — Z9889 Other specified postprocedural states: Secondary | ICD-10-CM

## 2023-08-29 DIAGNOSIS — E7800 Pure hypercholesterolemia, unspecified: Secondary | ICD-10-CM | POA: Diagnosis not present

## 2023-08-29 DIAGNOSIS — T827XXD Infection and inflammatory reaction due to other cardiac and vascular devices, implants and grafts, subsequent encounter: Secondary | ICD-10-CM | POA: Diagnosis not present

## 2023-08-29 DIAGNOSIS — B9561 Methicillin susceptible Staphylococcus aureus infection as the cause of diseases classified elsewhere: Secondary | ICD-10-CM

## 2023-08-29 DIAGNOSIS — T827XXA Infection and inflammatory reaction due to other cardiac and vascular devices, implants and grafts, initial encounter: Secondary | ICD-10-CM

## 2023-08-29 HISTORY — PX: INCISION AND DRAINAGE OF WOUND: SHX1803

## 2023-08-29 HISTORY — PX: VACUUM ASSISTED CLOSURE CHANGE: SHX5227

## 2023-08-29 HISTORY — PX: APPLICATION, SKIN SUBSTITUTE: SHX7530

## 2023-08-29 LAB — BASIC METABOLIC PANEL WITH GFR
Anion gap: 6 (ref 5–15)
BUN: 25 mg/dL — ABNORMAL HIGH (ref 8–23)
CO2: 21 mmol/L — ABNORMAL LOW (ref 22–32)
Calcium: 8.5 mg/dL — ABNORMAL LOW (ref 8.9–10.3)
Chloride: 111 mmol/L (ref 98–111)
Creatinine, Ser: 1.63 mg/dL — ABNORMAL HIGH (ref 0.61–1.24)
GFR, Estimated: 43 mL/min — ABNORMAL LOW (ref >60)
Glucose, Bld: 133 mg/dL — ABNORMAL HIGH (ref 70–99)
Potassium: 4 mmol/L (ref 3.5–5.1)
Sodium: 138 mmol/L (ref 135–145)

## 2023-08-29 LAB — CBC
HCT: 31.9 % — ABNORMAL LOW (ref 39.0–52.0)
Hemoglobin: 10.1 g/dL — ABNORMAL LOW (ref 13.0–17.0)
MCH: 27.8 pg (ref 26.0–34.0)
MCHC: 31.7 g/dL (ref 30.0–36.0)
MCV: 87.9 fL (ref 80.0–100.0)
Platelets: 396 10*3/uL (ref 150–400)
RBC: 3.63 MIL/uL — ABNORMAL LOW (ref 4.22–5.81)
RDW: 15.4 % (ref 11.5–15.5)
WBC: 7.4 10*3/uL (ref 4.0–10.5)
nRBC: 0 % (ref 0.0–0.2)

## 2023-08-29 LAB — GLUCOSE, CAPILLARY
Glucose-Capillary: 133 mg/dL — ABNORMAL HIGH (ref 70–99)
Glucose-Capillary: 123 mg/dL — ABNORMAL HIGH (ref 70–99)
Glucose-Capillary: 111 mg/dL — ABNORMAL HIGH (ref 70–99)
Glucose-Capillary: 399 mg/dL — ABNORMAL HIGH (ref 70–99)
Glucose-Capillary: 355 mg/dL — ABNORMAL HIGH (ref 70–99)

## 2023-08-29 SURGERY — IRRIGATION AND DEBRIDEMENT WOUND
Anesthesia: General | Site: Groin | Laterality: Left

## 2023-08-29 MED ORDER — ROCURONIUM BROMIDE 10 MG/ML (PF) SYRINGE
PREFILLED_SYRINGE | INTRAVENOUS | Status: AC
  Filled 2023-08-29: qty 10

## 2023-08-29 MED ORDER — PHENYLEPHRINE 80 MCG/ML (10ML) SYRINGE FOR IV PUSH (FOR BLOOD PRESSURE SUPPORT)
PREFILLED_SYRINGE | INTRAVENOUS | Status: AC
  Filled 2023-08-29: qty 10

## 2023-08-29 MED ORDER — PROPOFOL 10 MG/ML IV BOLUS
INTRAVENOUS | Status: DC | PRN
  Administered 2023-08-29: 70 mg via INTRAVENOUS

## 2023-08-29 MED ORDER — DEXAMETHASONE SODIUM PHOSPHATE 10 MG/ML IJ SOLN
INTRAMUSCULAR | Status: DC | PRN
  Administered 2023-08-29: 10 mg via INTRAVENOUS

## 2023-08-29 MED ORDER — DEXAMETHASONE SODIUM PHOSPHATE 10 MG/ML IJ SOLN
INTRAMUSCULAR | Status: AC
  Filled 2023-08-29: qty 1

## 2023-08-29 MED ORDER — ROCURONIUM BROMIDE 100 MG/10ML IV SOLN
INTRAVENOUS | Status: DC | PRN
  Administered 2023-08-29: 50 mg via INTRAVENOUS

## 2023-08-29 MED ORDER — LACTATED RINGERS IV SOLN: INTRAVENOUS | Status: DC

## 2023-08-29 MED ORDER — CHLORHEXIDINE GLUCONATE 0.12 % MT SOLN: 15.0000 mL | Freq: Once | OROMUCOSAL | Status: AC

## 2023-08-29 MED ORDER — FENTANYL CITRATE (PF) 100 MCG/2ML IJ SOLN: 25.0000 ug | INTRAMUSCULAR | Status: DC | PRN

## 2023-08-29 MED ORDER — SODIUM CHLORIDE 0.9 % IR SOLN
Status: DC | PRN
  Administered 2023-08-29: 3000 mL

## 2023-08-29 MED ORDER — PHENYLEPHRINE 80 MCG/ML (10ML) SYRINGE FOR IV PUSH (FOR BLOOD PRESSURE SUPPORT)
PREFILLED_SYRINGE | INTRAVENOUS | Status: DC | PRN
Start: 2023-08-29 — End: 2023-08-29
  Administered 2023-08-29: 80 ug via INTRAVENOUS
  Administered 2023-08-29: 160 ug via INTRAVENOUS

## 2023-08-29 MED ORDER — SODIUM CHLORIDE 0.9 % IV SOLN: INTRAVENOUS | Status: DC

## 2023-08-29 MED ORDER — VASHE WOUND IRRIGATION OPTIME
TOPICAL | Status: DC | PRN
  Administered 2023-08-29: 34 [oz_av]

## 2023-08-29 MED ORDER — ONDANSETRON HCL 4 MG/2ML IJ SOLN
INTRAMUSCULAR | Status: AC
  Filled 2023-08-29: qty 2

## 2023-08-29 MED ORDER — FENTANYL CITRATE (PF) 250 MCG/5ML IJ SOLN
INTRAMUSCULAR | Status: AC
  Filled 2023-08-29: qty 5

## 2023-08-29 MED ORDER — EPHEDRINE 5 MG/ML INJ
INTRAVENOUS | Status: AC
  Filled 2023-08-29: qty 5

## 2023-08-29 MED ORDER — ORAL CARE MOUTH RINSE: 15.0000 mL | Freq: Once | OROMUCOSAL | Status: AC

## 2023-08-29 MED ORDER — 0.9 % SODIUM CHLORIDE (POUR BTL) OPTIME
TOPICAL | Status: DC | PRN
  Administered 2023-08-29: 1000 mL

## 2023-08-29 MED ORDER — PROPOFOL 10 MG/ML IV BOLUS
INTRAVENOUS | Status: AC
  Filled 2023-08-29: qty 20

## 2023-08-29 MED ORDER — ONDANSETRON HCL 4 MG/2ML IJ SOLN
INTRAMUSCULAR | Status: DC | PRN
  Administered 2023-08-29: 4 mg via INTRAVENOUS

## 2023-08-29 MED ORDER — OXYCODONE HCL 5 MG/5ML PO SOLN: 5.0000 mg | Freq: Once | ORAL | Status: DC | PRN

## 2023-08-29 MED ORDER — SUGAMMADEX SODIUM 200 MG/2ML IV SOLN
INTRAVENOUS | Status: DC | PRN
  Administered 2023-08-29: 200 mg via INTRAVENOUS

## 2023-08-29 MED ORDER — OXYCODONE HCL 5 MG PO TABS: 5.0000 mg | ORAL_TABLET | Freq: Once | ORAL | Status: DC | PRN

## 2023-08-29 MED ORDER — LIDOCAINE 2% (20 MG/ML) 5 ML SYRINGE
INTRAMUSCULAR | Status: AC
  Filled 2023-08-29: qty 5

## 2023-08-29 MED ORDER — GLYCOPYRROLATE PF 0.2 MG/ML IJ SOSY
PREFILLED_SYRINGE | INTRAMUSCULAR | Status: AC
  Filled 2023-08-29: qty 1

## 2023-08-29 MED ORDER — CEFAZOLIN SODIUM-DEXTROSE 2-4 GM/100ML-% IV SOLN
2.0000 g | Freq: Three times a day (TID) | INTRAVENOUS | Status: DC
  Administered 2023-08-29 – 2023-09-01 (×10): 2 g via INTRAVENOUS
  Filled 2023-08-29 (×10): qty 100

## 2023-08-29 MED ORDER — MIDAZOLAM HCL 2 MG/2ML IJ SOLN: 0.5000 mg | Freq: Once | INTRAMUSCULAR | Status: DC | PRN

## 2023-08-29 MED ORDER — LIDOCAINE 2% (20 MG/ML) 5 ML SYRINGE
INTRAMUSCULAR | Status: DC | PRN
  Administered 2023-08-29: 60 mg via INTRAVENOUS

## 2023-08-29 MED ORDER — CHLORHEXIDINE GLUCONATE 0.12 % MT SOLN
OROMUCOSAL | Status: AC
Start: 2023-08-29 — End: 2023-08-29
  Administered 2023-08-29: 15 mL via OROMUCOSAL
  Filled 2023-08-29: qty 15

## 2023-08-29 MED ORDER — PHENYLEPHRINE HCL-NACL 20-0.9 MG/250ML-% IV SOLN
INTRAVENOUS | Status: DC | PRN
  Administered 2023-08-29: 40 ug/min via INTRAVENOUS

## 2023-08-29 MED ORDER — FENTANYL CITRATE (PF) 250 MCG/5ML IJ SOLN
INTRAMUSCULAR | Status: DC | PRN
Start: 2023-08-29 — End: 2023-08-29
  Administered 2023-08-29: 100 ug via INTRAVENOUS
  Administered 2023-08-29: 50 ug via INTRAVENOUS

## 2023-08-29 SURGICAL SUPPLY — 21 items
BAG COUNTER SPONGE SURGICOUNT (BAG) ×2 IMPLANT
CANISTER SUCTION 3000ML PPV (SUCTIONS) ×2 IMPLANT
COVER SURGICAL LIGHT HANDLE (MISCELLANEOUS) ×2 IMPLANT
DRAPE HALF SHEET 40X57 (DRAPES) IMPLANT
DRESSING VERAFLO CLEANS CC MED (GAUZE/BANDAGES/DRESSINGS) IMPLANT
ELECTRODE REM PT RTRN 9FT ADLT (ELECTROSURGICAL) ×2 IMPLANT
GLOVE BIO SURGEON STRL SZ7.5 (GLOVE) ×2 IMPLANT
GOWN STRL REUS W/ TWL LRG LVL3 (GOWN DISPOSABLE) ×4 IMPLANT
GOWN STRL REUS W/ TWL XL LVL3 (GOWN DISPOSABLE) ×2 IMPLANT
GRAFT SKIN WND MICRO 38 (Tissue) IMPLANT
IV NS IRRIG 3000ML ARTHROMATIC (IV SOLUTION) ×2 IMPLANT
KIT BASIN OR (CUSTOM PROCEDURE TRAY) ×2 IMPLANT
KIT TURNOVER KIT B (KITS) ×2 IMPLANT
NS IRRIG 1000ML POUR BTL (IV SOLUTION) ×2 IMPLANT
PACK CV ACCESS (CUSTOM PROCEDURE TRAY) IMPLANT
PACK GENERAL/GYN (CUSTOM PROCEDURE TRAY) ×2 IMPLANT
PAD ARMBOARD POSITIONER FOAM (MISCELLANEOUS) ×4 IMPLANT
PAD NEG PRESSURE SENSATRAC (MISCELLANEOUS) IMPLANT
SUT VIC AB 2-0 CT1 TAPERPNT 27 (SUTURE) IMPLANT
TOWEL GREEN STERILE (TOWEL DISPOSABLE) ×2 IMPLANT
WATER STERILE IRR 1000ML POUR (IV SOLUTION) ×2 IMPLANT

## 2023-08-29 NOTE — Op Note (Signed)
    Patient name: [name] MRN: [id number] DOB: 10-09-1945 Sex: male  08/29/2023 Pre-operative Diagnosis: Left groin postsurgical wound Post-operative diagnosis:  Same Surgeon:  Ace Holder C. Vikki Graves, MD Assistant: Maryanna Smart, PA Procedure Performed: 1.  Washout left groin wound using Pulsavac with 1.5 L saline and 1 L of Vashe 2.  Application 38 cm Kerecis fish skin 3.  Application of negative pressure dressing with blue sponge left groin wound totaling 3 x 1 x 1 cm  Indications: 78 year old male with history of aortobifemoral bypass with left to right femorofemoral bypass and recently underwent angiography via cannulation of the femorofemoral bypass for right lower extremity acute on chronic ischemia.  He followed up with acute groin pain was found to have fluid around the graft and ultimately was taken for washout where there was purulence which is noted to be pansensitive Staph aureus and he is now maintained on antibiotics.  He is now indicated for takeback with washout and possible closure or partial closure of the left groin wound.  Experience assistant was necessary to facilitate exposure of the graft as well as fashioning wound VAC.  Findings: The graft appeared clean and all the tissue around was clean.  This was thoroughly irrigated and for skin was placed on top of the graft and there was a layer of tissue closed with interrupted 2-0 Vicryl over the graft and a wound VAC was placed at the skin and subcutaneous level.   Procedure:  The patient was identified in the holding area and taken to the operating room where LMA anesthesia was induced.  He was sterilely prepped and draped in the left lower extremity after the wound VAC was removed, antibiotics were up-to-date and a timeout was called.  We began by examining the existing wound in the graft.  There was no further purulence identified.  This wound was thoroughly washed out with 1.5 L of saline and 1 L of Vashe using the Pulsavac.   It was very clean after that I did trim away some of the darker looking tissue with scissors back to healthy appearing tissue.  I then closed down the space around the graft using interrupted 2-0 Vicryl suture and a wound VAC with a blue sponge was fashioned at the skin subcutaneous level to -125 mmHg suction.  Patient was then awakened from anesthesia having tolerated the procedure well any complication.  All counts were correct at completion.  EBL: 10 cc     Earlena Werst C. Vikki Graves, MD [facility] Office: [phone] Pager: [phone]

## 2023-08-29 NOTE — Anesthesia Procedure Notes (Signed)
 Procedure Name: Intubation Date/Time: 08/29/2023 11:14 AM  Performed by: Pasty Bongo, CRNAPre-anesthesia Checklist: Patient identified, Emergency Drugs available, Suction available and Patient being monitored Patient Re-evaluated:Patient Re-evaluated prior to induction Oxygen Delivery Method: Circle System Utilized Preoxygenation: Pre-oxygenation with 100% oxygen Induction Type: IV induction Ventilation: Mask ventilation without difficulty Laryngoscope Size: Mac and 3 Grade View: Grade I Tube type: Oral Tube size: 7.0 mm Number of attempts: 1 Airway Equipment and Method: Stylet and Oral airway Placement Confirmation: ETT inserted through vocal cords under direct vision, positive ETCO2 and breath sounds checked- equal and bilateral Secured at: 23 (@ lip) cm Tube secured with: Tape Dental Injury: Teeth and Oropharynx as per pre-operative assessment  Comments: Atraumatic

## 2023-08-29 NOTE — Anesthesia Postprocedure Evaluation (Signed)
 Anesthesia Post Note  Patient: [name]  Procedure(s) Performed: IRRIGATION AND DEBRIDEMENT WOUND (Left) REPLACEMENT, WOUND VAC DRESSING, GROIN (Left: Groin) APPLICATION, SKIN SUBSTITUTE (Left: Groin)     Patient location during evaluation: PACU Anesthesia Type: General Level of consciousness: awake and alert, patient cooperative and oriented Pain management: pain level controlled Vital Signs Assessment: post-procedure vital signs reviewed and stable Respiratory status: spontaneous breathing, nonlabored ventilation, respiratory function stable and patient connected to nasal cannula oxygen Cardiovascular status: blood pressure returned to baseline and stable Postop Assessment: no apparent nausea or vomiting Anesthetic complications: no   No notable events documented.  Last Vitals:  Vitals:   08/29/23 1230 08/29/23 1251  BP: (!) 103/57 110/66  Pulse: 78 78  Resp: 17 16  Temp: 37.1 C 36.5 C  SpO2: 94% 95%    Last Pain:  Vitals:   08/29/23 1251  TempSrc: Oral  PainSc:                  [name],E. Kaymon Denomme

## 2023-08-29 NOTE — Progress Notes (Signed)
 Pt. Transported off the floor to procedure.

## 2023-08-29 NOTE — Progress Notes (Addendum)
 Consult/PROGRESS NOTE    SHIZUO GIACHETTI  QIO:[id number] DOB: 1945/05/17 DOA: 08/25/2023 PCP: Wayne Haines, MD    No chief complaint on file.   Brief Narrative:  [name] is a 78 y.o. male with past medical history of PAD s/p fem-fem bypass, T2DM, HTN, CAD s/p PTCA/DES to proximal LAD, chronic HFrEF, paroxysmal atrial fibrillation on xarelto , HLD, GERD, BPH, CKD 3A who presented for left groin pain.   Patient with history of PAD s/p right SFA and AT mechanical thrombectomy and right SFA drug-coated balloon angioplasty on 07/10/2023. Presented to ED this past Monday (5/5) for new onset severe left groin pain. CTA revealed stranding around left groin suggestive of inflammation versus infection. Patient subsequently discharged from ED. Pain did not improve after discharge and patient seen by vascular surgery on 5/9. Ultrasound showed fluid surrounding proximal portion of left to right fem-fem bypass (not evident on prior CTA), concerning for infection at left groin cath site and concern for infection involving pre-existing fem-fem bypass graft. Patient ultimately admitted by VVS for further evaluation and care.   TRH consulted to assist with management of hyperglycemia.   Assessment & Plan:   Principal Problem:   Infection of vascular bypass graft (HCC) Active Problems:   AKI (acute kidney injury) (HCC)   DM2 (diabetes mellitus, type 2) (HCC)   Essential hypertension   CKD stage 3b, GFR 30-44 ml/min (HCC)   Hypercholesterolemia   Atherosclerosis of native artery of extremity with intermittent claudication (HCC)   Coronary artery disease   Paroxysmal atrial fibrillation (HCC)   PVD (peripheral vascular disease) (HCC)   PAD (peripheral artery disease) (HCC)   Chronic combined systolic (congestive) and diastolic (congestive) heart failure (HCC)   GERD (gastroesophageal reflux disease)   BPH (benign prostatic hyperplasia)  #1 uncontrolled type 2 diabetes mellitus with  hyperglycemia -Hemoglobin A1c 10.6 (06/30/2023). - Patient noted to be on Lantus  35 units daily, Jardiance  10 mg daily at home prior to admission. - CBGs noted initially during the hospitalization to range from 270s to 440s. - Patient placed on Semglee  35 units daily at bedtime with improvement in CBG from 449-285. - CBG noted at 123 this morning. - Continue Semglee  35 units nightly, meal coverage NovoLog  12 units 3 times daily, SSI.  2.  Borderline BP - Patient noted to have soft blood pressure this morning with systolic blood pressures in the low 100s.  Patient noted to have systolic blood pressure of 89/57 the evening of 08/27/2023, likely secondary to acute infection from problem #5. - Discontinued Norvasc . - Hold Isordil. - Continue IV fluids for another 24 hours and monitor blood pressure.   - Continue IV antibiotics.   - Follow.  3.  Hypertension -Patient noted with soft/borderline blood pressure. -Patient noted to be on Norvasc , Entresto , Coreg , Imdur prior to admission. - Discontinued Norvasc . - Hold Isordil. -Continue to hold home regimen Entresto . -Monitor BP on Coreg . - IV fluids.  4.  AKI on CKD stage IIIa -Baseline creatinine approximately 1.2-1.35. - Creatinine noted on admission as high as 1.68. - Creatinine this morning at 1.63. - Patient noted to be on Entresto  and Jardiance  prior to admission. - Jardiance  and Entresto  discontinued due to AKI. - Likely secondary to prerenal azotemia in the setting of Entresto , Jardiance . - Urine output of 1.150 L over the past 24 hours. - BP soft. - Urinalysis with small leukocytes, nitrite negative, rare bacteria, 0-5 WBCs.   -Urine sodium of 87, urine creatinine of 61. - Renal  ultrasound negative for hydronephrosis, mildly increased cortical echogenicity of both kidneys consistent with chronic kidney disease. - Continue IV fluids with normal saline 100 cc an hour for another 24 hours.   - Hold Isordil.   - IV vancomycin IV  Zosyn  discontinued and patient now on IV Ancef.   - Monitor urine output.  - Repeat labs in the AM.  5.  Infected femorofemoral bypass graft/PAD status post left groin washout, primary repair of graftotomy, application of Kerecis, partial closure and VAC placement 08/26/2023 per Dr. Vikki Graves - Status post right SFA and AT mechanical thrombectomy and right SFA drug-coated balloon angioplasty on 07/10/2023. - Patient presented to the ED with new onset left severe groin pain on Monday, 08/21/2023. - CTA revealed stranding around left groin suggestive of inflammation versus infection. - Patient initially discharged from the ED however no improvement with pain and seen by vascular surgery on 08/25/2023. - Ultrasound done showed fluid surrounding proximal portion of the left to right femorofemoral bypass not evident on CTA, concerning for infection at left groin cath site and concern for infection involving pre-existing femorofemoral bypass graft. - Patient admitted by vascular surgery taking for left groin washout. - Patient went back to the OR today 08/29/2023 for left groin washout and graft appeared clean and all the tissue around was clean.  Area thoroughly irrigated and wound VAC placed.  - Left groin culture/Gram stain with MSSA.  -Was on IV vancomycin IV Zosyn  and has been transition to IV Ancef per primary team. - Per primary service.  6.  CAD status post PTCA/DES to proximal LAD -Patient on aspirin , Coreg , Imdur, Entresto , Norvasc  prior to admission. - Due to soft blood pressure and AKI,  Entresto  and Norvasc  discontinued. - Continue to hold Isordil until BP improves.  - Continue Coreg .  7.  Chronic HFrEF -Compensated. - Last 2D echo EF (05/23/2023 ) of 45 to 50%, regional wall motion abnormalities, mild LVH. - Patient with soft blood pressure. - Continue aspirin , Coreg . - Entresto  discontinued due to AKI. - Continue to hold Norvasc  and Imdur.  8.  Paroxysmal A-fib -Currently normal sinus  rhythm. - Continue Coreg  for rate control. - Home regimen Xarelto  on hold pending VVS management. - On heparin  for DVT prophylaxis. - Resumption of Xarelto  per VVS.  9.  Hyperlipidemia - Continue Crestor , Zetia .  10.  GERD - Continue PPI.  11.  BPH - Continue Flomax .    DVT prophylaxis: Heparin .  Per primary team Code Status: Full Family Communication: Updated patient and family at bedside.. Disposition: Per primary team  Status is: Inpatient Remains inpatient appropriate because: Severity of illness   Consultants:  [facility] hospitalist: Dr. Consuela Denier 08/27/2023  Procedures:  Renal ultrasound 08/27/2023 Lower extremity arterial Dopplers 08/25/2023 ABI with TBI 08/25/2023  1.  Washout of left groin wound with 1 L saline and 1 L of Vashe using Pulsavac 2.  Application of vancomycin/gentamicin  antibiotic beads 3.  Application 38 cm Kerecis 4.  Partial closure of left groin wound with wound VAC placement totaling 4 x 2 cm 5.  Primary repair of graftotomy-----08/26/2023 per Dr. Vikki Graves   1.  Washout left groin wound using Pulsavac with 1.5 L saline and 1 L of Vashe 2.  Application 38 cm Kerecis fish skin 3.  Application of negative pressure dressing with blue sponge left groin wound totaling 3 x 1 x 1 cm--- 08/29/2023 by Dr. Vikki Graves  Antimicrobials:  Anti-infectives (From admission, onward)    Start     Dose/Rate Route  Frequency Ordered Stop   08/29/23 1600  ceFAZolin (ANCEF) IVPB 2g/100 mL premix        2 g 200 mL/hr over 30 Minutes Intravenous Every 8 hours 08/29/23 1311     08/26/23 1800  Vancomycin (VANCOCIN) 1,250 mg in sodium chloride  0.9 % 250 mL IVPB  Status:  Discontinued        1,250 mg 166.7 mL/hr over 90 Minutes Intravenous Every 24 hours 08/25/23 1656 08/26/23 1010   08/26/23 1800  vancomycin (VANCOCIN) IVPB 1000 mg/200 mL premix  Status:  Discontinued        1,000 mg 200 mL/hr over 60 Minutes Intravenous Every 24 hours 08/26/23 1010 08/29/23 1311   08/26/23 0916   vancomycin (VANCOCIN) 1,000 mg, gentamicin  (GARAMYCIN ) 80 mg in sodium chloride  irrigation 0.9 % 3,000 mL irrigation  Status:  Discontinued          As needed 08/26/23 0918 08/26/23 0920   08/25/23 1745  vancomycin (VANCOREADY) IVPB 1500 mg/300 mL        1,500 mg 150 mL/hr over 120 Minutes Intravenous  Once 08/25/23 1651 08/25/23 2055   08/25/23 1745  piperacillin -tazobactam (ZOSYN ) IVPB 3.375 g  Status:  Discontinued        3.375 g 12.5 mL/hr over 240 Minutes Intravenous Every 8 hours 08/25/23 1651 08/29/23 1311         Subjective: Patient laying in bed, noted to have undergone the procedure earlier on today.  Family at bedside.  Patient denies any chest pain or shortness of breath.  No abdominal pain.  Feels left groin pain has improved.    Objective: Vitals:   08/29/23 1200 08/29/23 1215 08/29/23 1230 08/29/23 1251  BP: 119/62 (!) 116/56 (!) 103/57 110/66  Pulse: 83 79 78 78  Resp: 12 10 17 16   Temp: 98.6 F (37 C)  98.8 F (37.1 C) 97.7 F (36.5 C)  TempSrc:    Oral  SpO2: 96% 94% 94% 95%  Weight:      Height:        Intake/Output Summary (Last 24 hours) at 08/29/2023 1646 Last data filed at 08/29/2023 1256 Gross per 24 hour  Intake 1731.1 ml  Output 2035 ml  Net -303.9 ml   Filed Weights   08/29/23 0902  Weight: 80.1 kg    Examination:  General exam: NAD Respiratory system: CTAB anterior lung fields.  No wheezes, no crackles, no rhonchi.  Fair air movement.  Speaking in full sentences.   Cardiovascular system: Regular rate rhythm no murmurs rubs or gallops.  No JVD.  No lower extremity edema.  Gastrointestinal system: Abdomen is soft, nontender, nondistended, positive bowel sounds.  No rebound.  No guarding. GU: Left groin with wound VAC. Central nervous system: Alert and oriented. No focal neurological deficits. Extremities: Symmetric 5 x 5 power. Skin: No rashes, lesions or ulcers Psychiatry: Judgement and insight appear normal. Mood & affect appropriate.      Data Reviewed: I have personally reviewed following labs and imaging studies  CBC: Recent Labs  Lab 08/25/23 1633 08/26/23 0450 08/29/23 0432  WBC 13.5* 11.5* 7.4  HGB 12.4* 10.7* 10.1*  HCT 39.2 33.7* 31.9*  MCV 90.1 88.9 87.9  PLT 341 330 396    Basic Metabolic Panel: Recent Labs  Lab 08/25/23 1633 08/26/23 0450 08/27/23 0953 08/28/23 0354 08/29/23 0432  NA 137 136 134* 137 138  K 4.9 4.9 4.9 4.3 4.0  CL 104 105 105 106 111  CO2 19* 19* 18* 21* 21*  GLUCOSE 231* 193* 265* 168* 133*  BUN 25* 26* 33* 33* 25*  CREATININE 1.68* 1.62* 1.62* 1.75* 1.63*  CALCIUM  9.3 8.8* 8.9 8.6* 8.5*    GFR: Estimated Creatinine Clearance: 37.9 mL/min (A) (by C-G formula based on SCr of 1.63 mg/dL (H)).  Liver Function Tests: Recent Labs  Lab 08/25/23 1633  AST 17  ALT 10  ALKPHOS 60  BILITOT 0.8  PROT 7.3  ALBUMIN  3.0*    CBG: Recent Labs  Lab 08/28/23 2121 08/29/23 0605 08/29/23 0910 08/29/23 1203 08/29/23 1548  GLUCAP 112* 133* 123* 111* 399*     Recent Results (from the past 240 hours)  Surgical pcr screen     Status: Abnormal   Collection Time: 08/26/23  5:10 AM   Specimen: Nasal Mucosa; Nasal Swab  Result Value Ref Range Status   MRSA, PCR NEGATIVE NEGATIVE Final   Staphylococcus aureus POSITIVE (A) NEGATIVE Final    Comment: (NOTE) The Xpert SA Assay (FDA approved for NASAL specimens in patients 66 years of age and older), is one component of a comprehensive surveillance program. It is not intended to diagnose infection nor to guide or monitor treatment. Performed at [facility], [address], [place], [place] [address]   Aerobic/Anaerobic Culture w Gram Stain (surgical/deep wound)     Status: None (Preliminary result)   Collection Time: 08/26/23  9:20 AM   Specimen: Wound  Result Value Ref Range Status   Specimen Description WOUND  Final   Special Requests LEFT GROIN SWAB  Final   Gram Stain   Final    FEW WBC PRESENT,  PREDOMINANTLY PMN FEW GRAM POSITIVE COCCI Performed at [facility], [address], [place], [place] [address]    Culture   Final    FEW STAPHYLOCOCCUS AUREUS NO ANAEROBES ISOLATED; CULTURE IN PROGRESS FOR 5 DAYS    Report Status PENDING  Incomplete   Organism ID, Bacteria STAPHYLOCOCCUS AUREUS  Final      Susceptibility   Staphylococcus aureus - MIC*    CIPROFLOXACIN <=0.5 SENSITIVE Sensitive     ERYTHROMYCIN <=0.25 SENSITIVE Sensitive     GENTAMICIN  <=0.5 SENSITIVE Sensitive     OXACILLIN 0.5 SENSITIVE Sensitive     TETRACYCLINE <=1 SENSITIVE Sensitive     VANCOMYCIN 1 SENSITIVE Sensitive     TRIMETH/SULFA <=10 SENSITIVE Sensitive     CLINDAMYCIN <=0.25 SENSITIVE Sensitive     RIFAMPIN <=0.5 SENSITIVE Sensitive     Inducible Clindamycin NEGATIVE Sensitive     LINEZOLID 2 SENSITIVE Sensitive     * FEW STAPHYLOCOCCUS AUREUS         Radiology Studies: No results found.       Scheduled Meds:  aspirin  EC  81 mg Oral Daily   carvedilol   12.5 mg Oral BID WC   Chlorhexidine  Gluconate Cloth  6 each Topical Daily   ezetimibe   10 mg Oral Daily   heparin  injection (subcutaneous)  5,000 Units Subcutaneous Q8H   insulin  aspart  0-20 Units Subcutaneous TID WC   insulin  aspart  12 Units Subcutaneous TID WC   insulin  glargine-yfgn  35 Units Subcutaneous QHS   isosorbide dinitrate  10 mg Oral BID   mupirocin ointment  1 Application Nasal BID   pantoprazole   40 mg Oral Daily   rosuvastatin   40 mg Oral Daily   tamsulosin   0.8 mg Oral QPM   Continuous Infusions:  sodium chloride  100 mL/hr at 08/29/23 1305    ceFAZolin (ANCEF) IV 2 g (  08/29/23 1601)     LOS: 4 days    Time spent: 40 minutes    Hilda Lovings, MD [facility]   To contact the attending provider between 7A-7P or the covering provider during after hours 7P-7A, please log into the web site [email or web address] and access using universal [place] password for that web site. If you do not have the  password, please call the hospital operator.  08/29/2023, 4:46 PM

## 2023-08-29 NOTE — Progress Notes (Signed)
  Progress Note    08/29/2023 10:35 AM * Day of Surgery *  Subjective: No overnight issues  Vitals:   08/29/23 0902 08/29/23 0919  BP: 117/69 117/69  Pulse: 82 82  Resp: 18   Temp: 97.8 F (36.6 C)   SpO2: 93%     Physical Exam: Awake alert and oriented Left groin wound VAC in place without tenderness to palpation  CBC    Component Value Date/Time   WBC 7.4 08/29/2023 0432   RBC 3.63 (L) 08/29/2023 0432   HGB 10.1 (L) 08/29/2023 0432   HGB 14.1 03/13/2023 1003   HCT 31.9 (L) 08/29/2023 0432   HCT 44.6 03/13/2023 1003   PLT 396 08/29/2023 0432   PLT 208 03/13/2023 1003   MCV 87.9 08/29/2023 0432   MCV 86 03/13/2023 1003   MCH 27.8 08/29/2023 0432   MCHC 31.7 08/29/2023 0432   RDW 15.4 08/29/2023 0432   RDW 13.9 03/13/2023 1003   LYMPHSABS 1.7 08/21/2023 1610   LYMPHSABS 1.5 03/13/2023 1003   MONOABS 1.1 (H) 08/21/2023 1610   EOSABS 0.0 08/21/2023 1610   EOSABS 0.1 03/13/2023 1003   BASOSABS 0.0 08/21/2023 1610   BASOSABS 0.0 03/13/2023 1003    BMET    Component Value Date/Time   NA 138 08/29/2023 0432   NA 140 03/13/2023 1003   K 4.0 08/29/2023 0432   CL 111 08/29/2023 0432   CO2 21 (L) 08/29/2023 0432   GLUCOSE 133 (H) 08/29/2023 0432   BUN 25 (H) 08/29/2023 0432   BUN 13 03/13/2023 1003   CREATININE 1.63 (H) 08/29/2023 0432   CREATININE 1.20 09/10/2013 0513   CALCIUM  8.5 (L) 08/29/2023 0432   GFRNONAA 43 (L) 08/29/2023 0432   GFRAA 82 02/09/2018 0721    INR    Component Value Date/Time   INR 2.2 (H) 08/25/2023 1633     Intake/Output Summary (Last 24 hours) at 08/29/2023 1035 Last data filed at 08/29/2023 0735 Gross per 24 hour  Intake 2685.57 ml  Output 1430 ml  Net 1255.57 ml     Assessment:  78 y.o. male is s/p I&D abscess left groin wound with aortobifemoral bypass and left to right femorofemoral bypass.  Cultures have resulted as pansensitive staph.  Plan: OR today for washout and possible partial closure left groin  wound  Rashaud Ybarbo C. Vikki Graves, MD [facility] Office: [phone] Pager: [phone]  08/29/2023 10:35 AM

## 2023-08-29 NOTE — OR Nursing (Signed)
 Wound vac change on the left groin by Dr. Vikki Graves on 08/29/2023. One blue sponge used.

## 2023-08-29 NOTE — Transfer of Care (Signed)
 Immediate Anesthesia Transfer of Care Note  Patient: [name]  Procedure(s) Performed: IRRIGATION AND DEBRIDEMENT WOUND (Left) REPLACEMENT, WOUND VAC DRESSING, GROIN (Left: Groin) APPLICATION, SKIN SUBSTITUTE (Left: Groin)  Patient Location: PACU  Anesthesia Type:General  Level of Consciousness: awake, alert , and oriented  Airway & Oxygen Therapy: Patient Spontanous Breathing  Post-op Assessment: Report given to RN, Post -op Vital signs reviewed and stable, and Patient moving all extremities  Post vital signs: Reviewed and stable  Last Vitals:  Vitals Value Taken Time  BP 119/62 08/29/23 1200  Temp 37 C 08/29/23 1200  Pulse 82 08/29/23 1205  Resp 13 08/29/23 1205  SpO2 94 % 08/29/23 1205  Vitals shown include unfiled device data.  Last Pain:  Vitals:   08/29/23 0914  TempSrc:   PainSc: 8       Patients Stated Pain Goal: 1 (08/29/23 0914)  Complications: No notable events documented.

## 2023-08-29 NOTE — Anesthesia Preprocedure Evaluation (Addendum)
 Anesthesia Evaluation  Patient identified by MRN, date of birth, ID band Patient awake    Reviewed: Allergy & Precautions, NPO status , Patient's Chart, lab work & pertinent test results, reviewed documented beta blocker date and time   History of Anesthesia Complications Negative for: history of anesthetic complications  Airway Mallampati: II  TM Distance: >3 FB Neck ROM: Full    Dental  (+) Dental Advisory Given   Pulmonary former smoker   breath sounds clear to auscultation       Cardiovascular hypertension, Pt. on medications and Pt. on home beta blockers (-) angina + CAD, + Past MI, + Peripheral Vascular Disease and +CHF (Entresto )  + dysrhythmias Atrial Fibrillation and Ventricular Tachycardia  Rhythm:Regular Rate:Normal  07/2023 ECHO: EF 30-35%, global hypokinesis with akinetic inferior and inferoseptal segments, normal RVF, mild MR   Neuro/Psych    Depression    negative neurological ROS     GI/Hepatic ,GERD  Medicated and Controlled,,  Endo/Other  diabetes (glu 123), Oral Hypoglycemic AgentsHypothyroidism    Renal/GU Renal InsufficiencyRenal disease   Bladder cancer    Musculoskeletal  (+) Arthritis ,    Abdominal   Peds  Hematology Hb 10.1, plt 396k   Anesthesia Other Findings   Reproductive/Obstetrics                              Anesthesia Physical Anesthesia Plan  ASA: 4  Anesthesia Plan: General   Post-op Pain Management: Ofirmev  IV (intra-op)*   Induction: Intravenous  PONV Risk Score and Plan: 2 and Ondansetron  and Dexamethasone   Airway Management Planned: Oral ETT  Additional Equipment: None  Intra-op Plan:   Post-operative Plan: Extubation in OR  Informed Consent: I have reviewed the patients History and Physical, chart, labs and discussed the procedure including the risks, benefits and alternatives for the proposed anesthesia with the patient or authorized  representative who has indicated his/her understanding and acceptance.     Dental advisory given  Plan Discussed with: CRNA and Surgeon  Anesthesia Plan Comments:         Anesthesia Quick Evaluation

## 2023-08-30 ENCOUNTER — Encounter (HOSPITAL_COMMUNITY): Payer: Self-pay | Admitting: Vascular Surgery

## 2023-08-30 ENCOUNTER — Other Ambulatory Visit: Payer: Self-pay

## 2023-08-30 ENCOUNTER — Inpatient Hospital Stay (HOSPITAL_COMMUNITY)

## 2023-08-30 DIAGNOSIS — B9561 Methicillin susceptible Staphylococcus aureus infection as the cause of diseases classified elsewhere: Secondary | ICD-10-CM | POA: Diagnosis not present

## 2023-08-30 DIAGNOSIS — T827XXD Infection and inflammatory reaction due to other cardiac and vascular devices, implants and grafts, subsequent encounter: Secondary | ICD-10-CM | POA: Diagnosis not present

## 2023-08-30 DIAGNOSIS — R011 Cardiac murmur, unspecified: Secondary | ICD-10-CM

## 2023-08-30 DIAGNOSIS — T827XXA Infection and inflammatory reaction due to other cardiac and vascular devices, implants and grafts, initial encounter: Secondary | ICD-10-CM | POA: Diagnosis not present

## 2023-08-30 LAB — BASIC METABOLIC PANEL WITH GFR
Anion gap: 4 — ABNORMAL LOW (ref 5–15)
BUN: 26 mg/dL — ABNORMAL HIGH (ref 8–23)
CO2: 22 mmol/L (ref 22–32)
Calcium: 8.3 mg/dL — ABNORMAL LOW (ref 8.9–10.3)
Chloride: 110 mmol/L (ref 98–111)
Creatinine, Ser: 1.36 mg/dL — ABNORMAL HIGH (ref 0.61–1.24)
GFR, Estimated: 53 mL/min — ABNORMAL LOW (ref >60)
Glucose, Bld: 319 mg/dL — ABNORMAL HIGH (ref 70–99)
Potassium: 4.6 mmol/L (ref 3.5–5.1)
Sodium: 136 mmol/L (ref 135–145)

## 2023-08-30 LAB — GLUCOSE, CAPILLARY
Glucose-Capillary: 267 mg/dL — ABNORMAL HIGH (ref 70–99)
Glucose-Capillary: 114 mg/dL — ABNORMAL HIGH (ref 70–99)
Glucose-Capillary: 200 mg/dL — ABNORMAL HIGH (ref 70–99)
Glucose-Capillary: 177 mg/dL — ABNORMAL HIGH (ref 70–99)

## 2023-08-30 LAB — ECHOCARDIOGRAM COMPLETE BUBBLE STUDY
AR max vel: 1.37 cm2
AV Area VTI: 1.28 cm2
AV Area mean vel: 1.25 cm2
AV Mean grad: 6 mmHg
AV Peak grad: 10.6 mmHg
Ao pk vel: 1.63 m/s
Area-P 1/2: 3.43 cm2
Calc EF: 47.2 %
MV VTI: 2.21 cm2
S' Lateral: 4 cm
Single Plane A2C EF: 50.6 %
Single Plane A4C EF: 44.6 %

## 2023-08-30 LAB — CBC
HCT: 31.8 % — ABNORMAL LOW (ref 39.0–52.0)
Hemoglobin: 9.9 g/dL — ABNORMAL LOW (ref 13.0–17.0)
MCH: 27.8 pg (ref 26.0–34.0)
MCHC: 31.1 g/dL (ref 30.0–36.0)
MCV: 89.3 fL (ref 80.0–100.0)
Platelets: 395 10*3/uL (ref 150–400)
RBC: 3.56 MIL/uL — ABNORMAL LOW (ref 4.22–5.81)
RDW: 15.1 % (ref 11.5–15.5)
WBC: 10.6 10*3/uL — ABNORMAL HIGH (ref 4.0–10.5)
nRBC: 0 % (ref 0.0–0.2)

## 2023-08-30 MED ORDER — INSULIN GLARGINE-YFGN 100 UNIT/ML ~~LOC~~ SOLN
40.0000 [IU] | Freq: Every day | SUBCUTANEOUS | Status: DC
  Administered 2023-08-30 – 2023-08-31 (×2): 40 [IU] via SUBCUTANEOUS
  Filled 2023-08-30 (×3): qty 0.4

## 2023-08-30 MED ORDER — RIVAROXABAN 15 MG PO TABS
15.0000 mg | ORAL_TABLET | Freq: Every day | ORAL | Status: DC
  Administered 2023-08-30 – 2023-08-31 (×2): 15 mg via ORAL
  Filled 2023-08-30 (×3): qty 1

## 2023-08-30 MED ORDER — PERFLUTREN LIPID MICROSPHERE
1.0000 mL | INTRAVENOUS | Status: AC | PRN
  Administered 2023-08-30: 3 mL via INTRAVENOUS

## 2023-08-30 NOTE — Progress Notes (Addendum)
 PHARMACY - ANTICOAGULATION CONSULT NOTE  Pharmacy Consult for xarelto  Indication: atrial fibrillation  Allergies  Allergen Reactions   Lisinopril  Swelling and Rash    Rash - face and tounge swell   Levaquin [Levofloxacin]     Patient Measurements: Height: 5\' 7"  (170.2 cm) Weight: 80.1 kg (176 lb 8 oz) IBW/kg (Calculated) : 66.1 HEPARIN  DW (KG): 80.1  Vital Signs: Temp: 98.5 F (36.9 C) (05/14 0356) Temp Source: Oral (05/14 0356) BP: 134/64 (05/14 0733) Pulse Rate: 82 (05/14 1118)  Labs: Recent Labs    08/28/23 0354 08/29/23 0432 08/30/23 0349  HGB  --  10.1* 9.9*  HCT  --  31.9* 31.8*  PLT  --  396 395  CREATININE 1.75* 1.63* 1.36*    Estimated Creatinine Clearance: 45.4 mL/min (A) (by C-G formula based on SCr of 1.36 mg/dL (H)).   Medical History: Past Medical History:  Diagnosis Date   Acute low back pain 01/12/2015   Acute lower limb ischemia 07/11/2023   Acute pain of both shoulders 07/22/2022   AKI (acute kidney injury) (HCC) 05/24/2022   Arthritis    Atherosclerosis of native arteries of the extremities with intermittent claudication 08/13/2013   Atherosclerosis of native artery of extremity with intermittent claudication (HCC) 08/13/2013   IMO SNOMED Dx Update Oct 2024     Backache 02/08/2013   Benign prostatic hyperplasia with urinary frequency 02/23/2023   Bladder cancer (HCC)    resection x3   BMI 27.0-27.9,adult 02/23/2023   Cellulitis 07/17/2023   Chronic combined systolic (congestive) and diastolic (congestive) heart failure (HCC) 07/22/2022   Chronic left shoulder pain 05/24/2023   Chronic right shoulder pain 09/02/2022   Chronic systolic CHF (congestive heart failure) (HCC)    CKD stage 3b, GFR 30-44 ml/min (HCC) 07/03/2023   Coronary artery disease    status post DMI RX Taxus stent RCA 2006 with susequent Stent LAD and subsequent  stent thrombosis RCA unable to be opened 2006 -neg mv 10/2008, 10/16/17 ISR to pLAD with PTCA/DES, CTO of RCA  with collaterals, EF 25%   Coronary artery disease involving native coronary artery of native heart with unstable angina pectoris (HCC)    Current moderate episode of major depressive disorder without prior episode (HCC) 03/28/2022   Diverticulitis 06/10/2022   Diverticulitis large intestine 05/11/2021   Diverticulitis of large intestine with abscess 05/24/2022   Diverticulitis of sigmoid colon 05/11/2021   DM2 (diabetes mellitus, type 2) (HCC) 02/09/2022   status post bilateral aortobifemoral bypass [id number] with recent fem to fembypass April  2011 per DR. Early     Dyssynergic defecation 03/21/2023   Essential hypertension 07/22/2022   Fatigue 03/13/2023   History of bladder cancer 02/23/2023   History of colonic diverticulitis 03/21/2023   Hypercholesterolemia 10/28/2022   HYPERLIPIDEMIA-MIXED 09/22/2008   Qualifier: Diagnosis of  By: Malon Seamen     Hypothyroidism 05/24/2022   Ischemic cardiomyopathy    ejection fraction of 40-45%   Lethargy 03/28/2022   Lumbar spinal stenosis    Myocardial infarction (HCC)    "I've had 4" (10/16/2017)   Need for prophylactic vaccination and inoculation against influenza 03/28/2022   NSVT (nonsustained ventricular tachycardia) (HCC)    PAD (peripheral artery disease) (HCC) 07/17/2023   PAF (paroxysmal atrial fibrillation) (HCC) 02/09/2022   Paroxysmal atrial fibrillation (HCC)    Peripheral artery disease (HCC) 07/09/2023   Poorly controlled T2 diabetes mellitus (HCC) 09/27/2022   Protein-calorie malnutrition, severe 05/29/2022   PVC's (premature ventricular contractions)    PVD 09/22/2008  Qualifier: Diagnosis of  By: Malon Seamen     PVD (peripheral vascular disease) (HCC) 02/23/2023   Rotator cuff arthropathy of right shoulder 09/27/2022   Sigmoid diverticulitis 06/30/2023   Status post coronary artery stent placement    Status post Hartmann's procedure (HCC) 03/21/2023   TOBACCO ABUSE 05/29/2009   Qualifier: Diagnosis of  By:  Emma Hardy, RN, BSN, Levern Reader    Trigger ring finger of right hand 03/29/2023   Type 2 diabetes mellitus with other circulatory complications (HCC) 07/22/2022   Type II diabetes mellitus (HCC)    Unstable angina (HCC) 10/16/2017    Medications:  Medications Prior to Admission  Medication Sig Dispense Refill Last Dose/Taking   amLODipine  (NORVASC ) 5 MG tablet Take 1 tablet (5 mg total) by mouth daily. 90 tablet 3 08/25/2023   aspirin  EC 81 MG tablet Take 1 tablet (81 mg total) by mouth daily. Swallow whole. 90 tablet 3 08/25/2023   carvedilol  (COREG ) 12.5 MG tablet Take 1 tablet (12.5 mg total) by mouth 2 (two) times daily with a meal. 180 tablet 3 08/25/2023   cetirizine  (ZYRTEC ) 10 MG tablet Take 1 tablet (10 mg total) by mouth daily. 90 tablet 0 08/25/2023   cyclobenzaprine  (FLEXERIL ) 10 MG tablet Take 1 tablet (10 mg total) by mouth 3 (three) times daily as needed for muscle spasms. 15 tablet 0 Unknown   ezetimibe  (ZETIA ) 10 MG tablet Take 1 tablet (10 mg total) by mouth daily. 90 tablet 3 08/25/2023   insulin  glargine (LANTUS ) 100 unit/mL SOPN 35 units subcut daily   08/24/2023   isosorbide dinitrate (ISORDIL) 10 MG tablet Take 10 mg by mouth 2 (two) times daily.   08/25/2023   JARDIANCE  10 MG TABS tablet Take 1 tablet (10 mg total) by mouth daily. 30 tablet 2 08/25/2023   loperamide  (IMODIUM  A-D) 2 MG tablet Take 2 mg by mouth 4 (four) times daily as needed for diarrhea or loose stools.   Unknown   nitroGLYCERIN  (NITROSTAT ) 0.4 MG SL tablet Place 0.4 mg under the tongue every 5 (five) minutes as needed for chest pain.   Unknown   rivaroxaban  (XARELTO ) 20 MG TABS tablet Take 1 tablet (20 mg total) by mouth daily with supper. 30 tablet 3 08/24/2023 at  9:00 PM   rosuvastatin  (CRESTOR ) 40 MG tablet TAKE 1 TABLET(40 MG) BY MOUTH DAILY 90 tablet 1 08/24/2023   sacubitril -valsartan  (ENTRESTO ) 49-51 MG Take 1 tablet by mouth 2 (two) times daily. 60 tablet 3 08/25/2023   tamsulosin  (FLOMAX ) 0.4 MG CAPS capsule Take  0.8 mg by mouth every evening.   08/24/2023    Assessment: 77yoM with history of atrial fibrillation who recently underwent angiography via cannulation of the femorofemoral bypass for right lower extremity acute on chronic ischemia. The vascular team has consulted pharmacy to resume prior to admission Xarelto . Prior to admission patient was taking Xarelto  20mg  daily, but it appears his baseline renal function for the past 6 months is 1.3-1.7. Using these values the patient should be on the adjusted dose. This was discussed with vascular PA-C who agrees the patient should be on the lower dose to reduce risks.   Plan:  Xarelto  15 mg every day  Monitor for s/sx of bleeding  Mohammed Andrew, PharmD Clinical Pharmacist 08/30/2023 11:26 AM Please check AMION for all [facility] Pharm[facility] numbers

## 2023-08-30 NOTE — Progress Notes (Addendum)
 Progress Note    08/30/2023 6:47 AM 1 Day Post-Op  Subjective:  says he has a little pain in the left groin every now and then.   Afebrile HR 50's-90's NSR 90's-150's systolic 95% RA  Vitals:   08/29/23 2300 08/30/23 0356  BP: (!) 121/58 (!) 155/74  Pulse: 81 84  Resp: 14 16  Temp: 98.2 F (36.8 C) 98.5 F (36.9 C)  SpO2: 97% 93%    Physical Exam: General:  no distress Cardiac:  regular Lungs:  non labored Incisions:  left groin with vac with good seal Extremities:  bilateral feet are warm and well perfused Abdomen:  soft  CBC    Component Value Date/Time   WBC 10.6 (H) 08/30/2023 0349   RBC 3.56 (L) 08/30/2023 0349   HGB 9.9 (L) 08/30/2023 0349   HGB 14.1 03/13/2023 1003   HCT 31.8 (L) 08/30/2023 0349   HCT 44.6 03/13/2023 1003   PLT 395 08/30/2023 0349   PLT 208 03/13/2023 1003   MCV 89.3 08/30/2023 0349   MCV 86 03/13/2023 1003   MCH 27.8 08/30/2023 0349   MCHC 31.1 08/30/2023 0349   RDW 15.1 08/30/2023 0349   RDW 13.9 03/13/2023 1003   LYMPHSABS 1.7 08/21/2023 1610   LYMPHSABS 1.5 03/13/2023 1003   MONOABS 1.1 (H) 08/21/2023 1610   EOSABS 0.0 08/21/2023 1610   EOSABS 0.1 03/13/2023 1003   BASOSABS 0.0 08/21/2023 1610   BASOSABS 0.0 03/13/2023 1003    BMET    Component Value Date/Time   NA 136 08/30/2023 0349   NA 140 03/13/2023 1003   K 4.6 08/30/2023 0349   CL 110 08/30/2023 0349   CO2 22 08/30/2023 0349   GLUCOSE 319 (H) 08/30/2023 0349   BUN 26 (H) 08/30/2023 0349   BUN 13 03/13/2023 1003   CREATININE 1.36 (H) 08/30/2023 0349   CREATININE 1.20 09/10/2013 0513   CALCIUM  8.3 (L) 08/30/2023 0349   GFRNONAA 53 (L) 08/30/2023 0349   GFRAA 82 02/09/2018 0721    INR    Component Value Date/Time   INR 2.2 (H) 08/25/2023 1633     Intake/Output Summary (Last 24 hours) at 08/30/2023 0647 Last data filed at 08/30/2023 0631 Gross per 24 hour  Intake 1500 ml  Output 2155 ml  Net -655 ml      Component Ref Range & Units (hover) 4 d  ago  Specimen Description WOUND  Special Requests LEFT GROIN SWAB  Gram Stain FEW WBC PRESENT, PREDOMINANTLY PMN FEW GRAM POSITIVE COCCI Performed at [facility], [address] [place], [place] [address]  Culture FEW STAPHYLOCOCCUS AUREUS NO ANAEROBES ISOLATED; CULTURE IN PROGRESS FOR 5 DAYS  Report Status PENDING  Organism ID, Bacteria STAPHYLOCOCCUS AUREUS     Susceptibility   Staphylococcus aureus    MIC    CIPROFLOXACIN <=0.5 SENSI... Sensitive    CLINDAMYCIN <=0.25 SENS... Sensitive    ERYTHROMYCIN <=0.25 SENS... Sensitive    GENTAMICIN  <=0.5 SENSI... Sensitive    Inducible Clindamycin NEGATIVE Sensitive    LINEZOLID 2 SENSITIVE Sensitive    OXACILLIN 0.5 SENSITIVE Sensitive    RIFAMPIN <=0.5 SENSI... Sensitive    TETRACYCLINE <=1 SENSITIVE Sensitive    TRIMETH/SULFA <=10 SENSIT... Sensitive    VANCOMYCIN 1 SENSITIVE Sensitive                Assessment/Plan:  77 y.o. male is s/p:  Washout left left groin, primary repair of graftotomy, application of Kerecis, partial closure and vac placement on 08/26/2023 by  Dr. Vikki Graves  And Washout left groin wound using pulsavac with 1.5L saline and 1L Vashe, application of Kerecis and vac placement 08/29/2023 by Dr. Vikki Graves 1 Day Post-Op   -pt doing well this am.  Bilateral feet are warm and well perfused.   Will change vac tomorrow at bedside.  -discussed with pharmacy yesterday and we have switched his IV abx to Ancef.  Will need long term abx for discharge given he has aortobifem bypass graft and fem fem bypass graft.  We will consult ID today for recommendations. -AKI improving with creatinine down to 1.36 -DVT prophylaxis:  currently SCD's.  Will need to restart Xarelto .  Probably ok at this point but will d/w Dr. Vikki Graves.  -continue asa/statin.   -will defer to medicine service about pt's antihypertensives and DM meds.  -hopeful for discharge tomorrow after [facility] needs arranged and pt has been seen by ID.    Maryanna Smart,  PA-C [facility] [phone] 08/30/2023 6:47 AM  I have independently interviewed and examined patient and agree with PA assessment and plan above.   Qiara Minetti C. Vikki Graves, MD [facility] [place] Office: [phone] Pager: [phone]

## 2023-08-30 NOTE — Evaluation (Signed)
 Physical Therapy Evaluation Patient Details Name: [name] MRN: [id number] DOB: [birth year] Today's Date: 08/30/2023  History of Present Illness  78 y.o. male presents to [facility] 08/25/23 with L groin pain with movement. Admitted with infected L to R femorofemoral bypass graft. 5/10  L LE I&D with wound vac placement. 5/13 repeat I&D, wound vac replacement. PMHx:  PAD s/p fem-fem bypass, T2DM, HTN, CAD s/p PTCA/DES to proximal LAD, chronic HFrEF, paroxysmal atrial fibrillation on xarelto , HLD, GERD, BPH, CKD 3A   Clinical Impression  Pt in bed upon arrival and agreeable to PT eval. PTA, pt was independent for mobility with no AD. In today's session, pt was ModI for bed mobility and to stand with no AD. Pt was able to ambulate 150 ft with no AD and supervision for line management. Pt reports being at functional mobility baseline. He will have intermittent help available upon d/c home. Pt has no further questions or concerns regarding mobility or safety upon return home. Pt has no further acute or post-acute PT needs with PT signing off. Recommending continued mobility with mobility specialist and staff while in the hospital. Please re-consult if new needs arise.       If plan is discharge home, recommend the following: Assist for transportation;Help with stairs or ramp for entrance   Can travel by private vehicle    Yes    Equipment Recommendations None recommended by PT     Functional Status Assessment Patient has had a recent decline in their functional status and demonstrates the ability to make significant improvements in function in a reasonable and predictable amount of time.     Precautions / Restrictions Precautions Precautions: Fall Precaution/Restrictions Comments: L groin wound vac Restrictions Weight Bearing Restrictions Per Provider Order: No      Mobility  Bed Mobility Overal bed mobility: Modified Independent     Transfers Overall transfer level: Modified  independent Equipment used: None   Ambulation/Gait Ambulation/Gait assistance: Supervision Gait Distance (Feet): 160 Feet Assistive device: None Gait Pattern/deviations: Step-through pattern Gait velocity: decr    General Gait Details: steady gait with no overt LOB. Increased medial/lateral sway. Supervision for line management    Balance Overall balance assessment: Needs assistance, Mild deficits observed, not formally tested Sitting-balance support: No upper extremity supported, Feet supported Sitting balance-Leahy Scale: Good     Standing balance support: No upper extremity supported Standing balance-Leahy Scale: Good       Pertinent Vitals/Pain Pain Assessment Pain Assessment: No/denies pain    Home Living Family/patient expects to be discharged to:: Private residence Living Arrangements: Spouse/significant other Available Help at Discharge: Available PRN/intermittently;Family Type of Home: House Home Access: Level entry      Home Layout: One level Home Equipment: Agricultural consultant (2 wheels)      Prior Function Prior Level of Function : Independent/Modified Independent;Driving      Mobility Comments: Ind with no AD. Pt denied falls. ADLs Comments: Ind with ADLs. Pt cares for his girl friend who is post CVA. Pt usually does the driving, but has friends who can help while pt waits for clearance for driving.     Extremity/Trunk Assessment   Upper Extremity Assessment Upper Extremity Assessment: Defer to OT evaluation    Lower Extremity Assessment Lower Extremity Assessment: Overall WFL for tasks assessed (WFL ROM and strength)    Cervical / Trunk Assessment Cervical / Trunk Assessment: Normal  Communication   Communication Communication: Impaired Factors Affecting Communication: Hearing impaired    Cognition Arousal: Alert Behavior  During Therapy: WFL for tasks assessed/performed   PT - Cognitive impairments: No apparent impairments    Following  commands: Intact       Cueing Cueing Techniques: Verbal cues     General Comments General comments (skin integrity, edema, etc.): Educated on maintaining ROM in LLE and progressing walking as tolerated.     PT Assessment Patient does not need any further PT services   AM-PAC PT "6 Clicks" Mobility  Outcome Measure Help needed turning from your back to your side while in a flat bed without using bedrails?: None Help needed moving from lying on your back to sitting on the side of a flat bed without using bedrails?: None Help needed moving to and from a bed to a chair (including a wheelchair)?: None Help needed standing up from a chair using your arms (e.g., wheelchair or bedside chair)?: None Help needed to walk in hospital room?: A Little Help needed climbing 3-5 steps with a railing? : A Little 6 Click Score: 22    End of Session Equipment Utilized During Treatment: Gait belt Activity Tolerance: Patient tolerated treatment well Patient left: in bed;with call bell/phone within reach Nurse Communication: Mobility status PT Visit Diagnosis: Other abnormalities of gait and mobility (R26.89)    Time: 1610-9604 PT Time Calculation (min) (ACUTE ONLY): 18 min   Charges:   PT Evaluation $PT Eval Low Complexity: 1 Low   PT General Charges $$ ACUTE PT VISIT: 1 Visit        Orysia Blas, PT, DPT Secure Chat Preferred  Rehab Office [phone]   Alissa April Adela Ades 08/30/2023, 1:51 PM

## 2023-08-30 NOTE — Evaluation (Signed)
 Occupational Therapy Evaluation Patient Details Name: [name] MRN: [id number] DOB: [birth year] Today's Date: 08/30/2023   History of Present Illness   78 y.o. male admitted on 08/25/23 to [facility] with severe LT groin pain. Patient was seen by vascular surgery on 5/9. Ultrasound showed fluid surrounding proximal portion of left to right fem-fem bypass (not evident on prior CTA), concerning for infection at left groin cath site and was admitted to vascular surgery service for further evaluation and treatment. On 5/13, pt underwent IRRIGATION AND DEBRIDEMENT WOUND to LT groin.  Pt with past admission on 2/6. s/p ex lap with Hartmann's procedure 2/8 for Complicated diverticulitis with coloatmospheric fistula. PMHx: PAF, CAD s/p stent, ICM with EF of 25%, PAD s/p aorto bifembypass, HTN, DM2, COPD     Clinical Impressions Patient evaluated by Occupational Therapy with no further acute OT needs identified. All education has been completed and the patient has no further questions.  See below for any follow-up Occupational Therapy or equipment needs. OT is signing off. Thank you for this referral.      If plan is discharge home, recommend the following:   A little help with bathing/dressing/bathroom;Assist for transportation     Functional Status Assessment   Patient has had a recent decline in their functional status and demonstrates the ability to make significant improvements in function in a reasonable and predictable amount of time.     Equipment Recommendations   None recommended by OT     Recommendations for Other Services   PT consult     Precautions/Restrictions   Precautions Precautions: Fall Restrictions Other Position/Activity Restrictions: No heavy lifting for 4 weeks. No driving until follow outpatient up.-Pt educated to this.     Mobility Bed Mobility Overal bed mobility: Modified Independent (for sit to supine)                  Transfers                           Balance Overall balance assessment: Mild deficits observed, not formally tested                                         ADL either performed or assessed with clinical judgement   ADL Overall ADL's : At baseline                                       General ADL Comments: Pt able to perform toileting including ambulating to toilet holding his IV pole with no more assitance than supervision for line management with wound vac. Pt performed his own peri care, able to stand at sink for grooming. Pt able to demonstrate LE dressing, and reports that he feels he is at his baseline except for the lines.     Vision Baseline Vision/History:  (readers only) Patient Visual Report: No change from baseline Vision Assessment?: No apparent visual deficits     Perception         Praxis         Pertinent Vitals/Pain Pain Assessment Pain Assessment: 0-10 Pain Score: 8  Pain Location: LT groin. Pain Descriptors / Indicators: Burning Pain Intervention(s): Limited activity within patient's tolerance, Monitored during session, Premedicated before session, Repositioned, Patient requesting pain meds-RN  notified, RN gave pain meds during session     Extremity/Trunk Assessment Upper Extremity Assessment Upper Extremity Assessment: Overall WFL for tasks assessed;Right hand dominant   Lower Extremity Assessment Lower Extremity Assessment: Defer to PT evaluation       Communication Communication Communication: No apparent difficulties Factors Affecting Communication: Hearing impaired   Cognition Arousal: Alert Behavior During Therapy: WFL for tasks assessed/performed Cognition: No apparent impairments             OT - Cognition Comments: Ox4                 Following commands: Intact       Cueing  General Comments          Exercises     Shoulder Instructions      Home Living Family/patient expects to be  discharged to:: Private residence Living Arrangements: Spouse/significant other Available Help at Discharge:  (Lives with girl friend who had a "very bad stroke" but pt does not need to give her physical assist. Just walks on her LT side due to her LT visdual field cuts.) Type of Home: House Home Access: Level entry     Home Layout: One level     Bathroom Shower/Tub: Chief Strategy Officer: Standard     Home Equipment: Agricultural consultant (2 wheels)          Prior Functioning/Environment Prior Level of Function : Independent/Modified Independent;Driving             Mobility Comments: Pt reports that he ambulates without assistance and and no AD. Pt denied falls. ADLs Comments: Pt reports Ind. Pt cares for his girl friend who is post CVA. Pt usually does the driving, but has friends who can help while pt waits for clearance for driving. Pt also educated on options such as [employer] and pt reports being familiar with this.    OT Problem List: Pain;Decreased activity tolerance   OT Treatment/Interventions:        OT Goals(Current goals can be found in the care plan section)   Acute Rehab OT Goals Patient Stated Goal: To resume driving asap OT Goal Formulation: All assessment and education complete, DC therapy Potential to Achieve Goals: Good   OT Frequency:       Co-evaluation              AM-PAC OT "6 Clicks" Daily Activity     Outcome Measure Help from another person eating meals?: None Help from another person taking care of personal grooming?: None Help from another person toileting, which includes using toliet, bedpan, or urinal?: A Little Help from another person bathing (including washing, rinsing, drying)?: A Little Help from another person to put on and taking off regular upper body clothing?: None Help from another person to put on and taking off regular lower body clothing?: None 6 Click Score: 22   End of Session Equipment Utilized  During Treatment: Other (comment) (IV pole) Nurse Communication: Patient requests pain meds  Activity Tolerance: Patient limited by pain Patient left: in bed;with call bell/phone within reach;with nursing/sitter in room  OT Visit Diagnosis: Pain Pain - Right/Left: Left Pain - part of body:  (Groin)                Time: 1610-9604 OT Time Calculation (min): 18 min Charges:  OT General Charges $OT Visit: 1 Visit OT Evaluation $OT Eval Low Complexity: 1 Low  Jullie Arps, OT Acute Rehab Services Office: [phone] 08/30/2023  Asher Blade 08/30/2023, 12:22 PM

## 2023-08-30 NOTE — TOC Initial Note (Signed)
 Transition of Care (TOC) - Initial/Assessment Note  Sherin Dingwall RN, BSN Transitions of Care Unit 4E- RN Case Manager See Treatment Team for direct phone #   Patient Details  Name: [name] MRN: [id number] Date of Birth: 12-20-1945  Transition of Care [facility]) CM/SW Contact:    Rox Cope, RN Phone Number: 08/30/2023, 1:35 PM  Clinical Narrative:                 Pt with infected bypass graft s/p washout and wound VAC placement.  Home Wound VAC order form signed on 5/13 and has been faxed to [facility]   Order for [facility], also note ID consult pending for potential LT IV abx.   CM spoke with pt at bedside, Per pt he lives alone, however has girlfriend that can assist. States his daughter will transport home.  Pt voiced he has RW and rollator at home- no other DME needs noted at this time.  PT eval pending.   Discussed home wound VAC needs and current pending approval for home [facility], also discussed HH RN needs for [facility] drsg needs as well has potential home IV abx needs pending ID recommendations. Pt voiced understanding. List provided for [facility] choice Per CMS guidelines from [employer] website with star ratings (copy placed in shadow chart). Pt voiced he has had HH last year- does not remember name- per review in [facility]- pt used [facility] in 2024. Pt confirmed [facility] was agency used. Pt also voiced he was agreeable to using [facility] again for his current [facility] needs on discharge.   Address, phone # and PCP all confirmed.   Referral called to [facility] liaison- referral has been accepted- liaison will follow for home VAC needs as well as potential home IV abx needs.  Cm also reached out to [facility] with home infusion pharmacy who will follow at distance for potential OPAT needs.   Expected Discharge Plan: Home w Home Health Services Barriers to Discharge: Continued Medical Work up   Patient Goals and CMS Choice Patient states their goals for this hospitalization  and ongoing recovery are:: return home CMS Medicare.gov Compare Post Acute Care list provided to:: Patient Choice offered to / list presented to : Patient      Expected Discharge Plan and Services   Discharge Planning Services: CM Consult Post Acute Care Choice: Durable Medical Equipment, Home Health Living arrangements for the past 2 months: Single Family Home                 DME Arranged: Vac DME Agency: [facility] Date DME Agency Contacted: 08/29/23 Time DME Agency Contacted: 1345 Representative spoke with at DME Agency: Sherrlyn Dolores HH Arranged: RN HH Agency: [facility] Date [facility] Agency Contacted: 08/30/23 Time HH Agency Contacted: 1335 Representative spoke with at [facility] Agency: Loetta Ringer  Prior Living Arrangements/Services Living arrangements for the past 2 months: Single Family Home Lives with:: Self Patient language and need for interpreter reviewed:: Yes Do you feel safe going back to the place where you live?: Yes      Need for Family Participation in Patient Care: Yes (Comment) Care giver support system in place?: Yes (comment) Current home services: DME (RW, Rollator) Criminal Activity/Legal Involvement Pertinent to Current Situation/Hospitalization: No - Comment as needed  Activities of Daily Living   ADL Screening (condition at time of admission) Independently performs ADLs?: Yes (appropriate for developmental age) Is the patient deaf or have difficulty hearing?: Yes Does the patient have difficulty seeing, even when wearing glasses/contacts?:  No Does the patient have difficulty concentrating, remembering, or making decisions?: No  Permission Sought/Granted Permission sought to share information with : Facility Industrial/product designer granted to share information with : Yes, Verbal Permission Granted     Permission granted to share info w AGENCY: HH/DME        Emotional Assessment Appearance:: Appears stated age Attitude/Demeanor/Rapport: Engaged Affect  (typically observed): Accepting, Appropriate, Pleasant Orientation: : Oriented to Self, Oriented to Place, Oriented to  Time, Oriented to Situation Alcohol / Substance Use: Not Applicable Psych Involvement: No (comment)  Admission diagnosis:  Infection of vascular bypass graft (HCC) [T82.7XXA] Patient Active Problem List   Diagnosis Date Noted   GERD (gastroesophageal reflux disease) 08/28/2023   BPH (benign prostatic hyperplasia) 08/28/2023   Infection of vascular bypass graft (HCC) 08/25/2023   Arthritis    PAD (peripheral artery disease) (HCC) 07/17/2023   Cellulitis 07/17/2023   Acute lower limb ischemia 07/11/2023   Peripheral artery disease (HCC) 07/09/2023   CKD stage 3b, GFR 30-44 ml/min (HCC) 07/03/2023   Sigmoid diverticulitis 06/30/2023   Chronic left shoulder pain 05/24/2023   Trigger ring finger of right hand 03/29/2023   Dyssynergic defecation 03/21/2023   History of colonic diverticulitis 03/21/2023   Status post Hartmann's procedure (HCC) 03/21/2023   Fatigue 03/13/2023   Benign prostatic hyperplasia with urinary frequency 02/23/2023   PVD (peripheral vascular disease) (HCC) 02/23/2023   History of bladder cancer 02/23/2023   BMI 27.0-27.9,adult 02/23/2023   Hypercholesterolemia 10/28/2022   Rotator cuff arthropathy of right shoulder 09/27/2022   Chronic right shoulder pain 09/02/2022   Type 2 diabetes mellitus with other circulatory complications (HCC) 07/22/2022   Paroxysmal atrial fibrillation (HCC) 07/22/2022   Essential hypertension 07/22/2022   Acute pain of both shoulders 07/22/2022   Chronic systolic CHF (congestive heart failure) (HCC) 07/22/2022   Chronic combined systolic (congestive) and diastolic (congestive) heart failure (HCC) 07/22/2022   AKI (acute kidney injury) (HCC) 05/24/2022   Hypothyroidism 05/24/2022   Need for prophylactic vaccination and inoculation against influenza 03/28/2022   Lethargy 03/28/2022   Bladder cancer (HCC) 02/09/2022    Coronary artery disease 02/09/2022   Ischemic cardiomyopathy 02/09/2022   Lumbar spinal stenosis 02/09/2022   Myocardial infarction (HCC) 02/09/2022   NSVT (nonsustained ventricular tachycardia) (HCC) 02/09/2022   PAF (paroxysmal atrial fibrillation) (HCC) 02/09/2022   PVC's (premature ventricular contractions) 02/09/2022   DM2 (diabetes mellitus, type 2) (HCC) 02/09/2022   Diverticulitis large intestine s/p LAR/colostomy takedown 06/30/2023 05/11/2021   Diverticulitis of sigmoid colon 05/11/2021   Status post coronary artery stent placement    Acute low back pain 01/12/2015   Atherosclerosis of native artery of extremity with intermittent claudication (HCC) 08/13/2013   Backache 02/08/2013   TOBACCO ABUSE 05/29/2009   HYPERLIPIDEMIA-MIXED 09/22/2008   PVD 09/22/2008   PCP:  Wayne Haines, MD Pharmacy:   [facility] [phone] - [place], [place] - [address] AT [facility] [address] [place] [address] Phone: [phone] Fax: [phone]     Social Drivers of Health (SDOH) Social History: SDOH Screenings   Food Insecurity: No Food Insecurity (08/25/2023)  Housing: Low Risk  (08/25/2023)  Transportation Needs: No Transportation Needs (08/25/2023)  Utilities: Not At Risk (08/25/2023)  Depression (PHQ2-9): Low Risk  (08/16/2023)  Social Connections: Socially Integrated (08/25/2023)  Tobacco Use: Medium Risk (08/29/2023)   SDOH Interventions:     Readmission Risk Interventions    07/03/2023   10:09 AM  Readmission Risk Prevention Plan  Transportation Screening Complete  PCP or Specialist Appt within 5-7 Days Complete  Home Care Screening Complete  Medication Review (RN CM) Complete

## 2023-08-30 NOTE — Progress Notes (Signed)
 PROGRESS NOTE  SIMS DUPIN UVO:[id number] DOB: February 18, 1946 DOA: 08/25/2023 PCP: Wayne Haines, MD   LOS: 5 days   Brief narrative:  Brief Narrative:  [name] is a 78 y.o. male with past medical history of PAD s/p fem-fem bypass, type 2 diabetes, hypertension, coronary artery disease status post PTCA, chronic heart failure with reduced ejection fraction, atrial fibrillation, hyperlipidemia, GERD, PTSD and CKD stage IIIa presented to the hospital initially with left groin pain.  Of note patient has history of  s/p right SFA and AT mechanical thrombectomy and right SFA drug-coated balloon angioplasty on 07/10/2023.  This time presented with left groin severe pain. CTA revealed stranding around left groin suggestive of inflammation versus infection. Patient was subsequently discharged from ED. Pain did not improve after discharge and patient seen by vascular surgery on 5/9. Ultrasound showed fluid surrounding proximal portion of left to right fem-fem bypass (not evident on prior CTA), concerning for infection at left groin cath site and was admitted to vascular surgery service for further evaluation and treatment.  Medical team was consulted for management of hyperglycemia.       Assessment/Plan: Principal Problem:   Infection of vascular bypass graft (HCC) Active Problems:   AKI (acute kidney injury) (HCC)   DM2 (diabetes mellitus, type 2) (HCC)   Essential hypertension   CKD stage 3b, GFR 30-44 ml/min (HCC)   Hypercholesterolemia   Atherosclerosis of native artery of extremity with intermittent claudication (HCC)   Coronary artery disease   Paroxysmal atrial fibrillation (HCC)   PVD (peripheral vascular disease) (HCC)   PAD (peripheral artery disease) (HCC)   Chronic combined systolic (congestive) and diastolic (congestive) heart failure (HCC)   GERD (gastroesophageal reflux disease)   BPH (benign prostatic hyperplasia)  uncontrolled type 2 diabetes mellitus with  hyperglycemia -Hemoglobin A1c 10.6 (06/30/2023).  Patient on Jardiance  10 mg daily and Lantus  35 units at home.  Continue Semglee  35 units, NovoLog  12 units 3 times daily with meals and sliding scale insulin  at this time.  Latest POC glucose of 267.  Improved from yesterday but will need fine-tuning.  Will increase Lantus  to 40 units at bedtime   Hypotension - Off Norvasc  and Isordil.  Hold Entresto  for now.  Received IV fluids.  Blood pressure has improved at this time.  Patient on Norvasc , Entresto , Coreg , Imdur prior to admission.  Will discontinue IV fluids since blood pressure has improved.    AKI on CKD stage IIIa -Baseline creatinine approximately 1.2-1.35.  Creatinine today at 1.3.  Entresto  losartan  on hold and received IV fluids.  Renal ultrasound negative for hydronephrosis but echogenic kidneys.  On IV Ancef.  Was on vancomycin which has been discontinued. Will continue to monitor renal function.   Infected femorofemoral bypass graft/PAD status post left groin washout, primary repair of graftotomy, application of Kerecis, partial closure and VAC placement 08/26/2023 per Dr. Vikki Graves - Status post right SFA and AT mechanical thrombectomy and right SFA drug-coated balloon angioplasty on 07/10/2023.  Patient was admitted to vascular surgery and underwent left groin washout initially followed by repeat intervention on 08/29/2023.  Status post wound VAC placement. Left groin culture/Gram stain with MSSA.  Antibiotics has been changed to IV Ancef at this time.  Continue Crestor  Zithromax  CAD status post PTCA/DES to proximal LAD -Patient on aspirin , Coreg , Imdur, Entresto , Norvasc  prior to admission.  Continue Crestor  and Coreg    Chronic HFrEF -Compensated. Review of latest 2D echo EF (05/23/2023 ) of 45 to 50%, regional wall  motion abnormalities, mild LVH.  Resume medications as tolerated.   Paroxysmal A-fib On Coreg .  Xarelto  on hold.  On prophylactic heparin   Hyperlipidemia - Continue Crestor ,  Zetia .   GERD - Continue PPI.    BPH - Continue Flomax .  DVT prophylaxis: heparin  injection 5,000 Units Start: 08/25/23 1800 Place TED hose Start: 08/25/23 1614   Disposition: As per primary team.  Status is: Inpatient Remains inpatient appropriate because: Status post vascular intervention, IV antibiotics, hyperglycemia    Code Status:     Code Status: Full Code  Family Communication: None at bedside   Procedures: ABI with TBI 08/25/2023  Left groin intervention 5/10 and 08/29/2023  Anti-infectives:  Cefazolin IV  Anti-infectives (From admission, onward)    Start     Dose/Rate Route Frequency Ordered Stop   08/29/23 1600  ceFAZolin (ANCEF) IVPB 2g/100 mL premix        2 g 200 mL/hr over 30 Minutes Intravenous Every 8 hours 08/29/23 1311     08/26/23 1800  Vancomycin (VANCOCIN) 1,250 mg in sodium chloride  0.9 % 250 mL IVPB  Status:  Discontinued        1,250 mg 166.7 mL/hr over 90 Minutes Intravenous Every 24 hours 08/25/23 1656 08/26/23 1010   08/26/23 1800  vancomycin (VANCOCIN) IVPB 1000 mg/200 mL premix  Status:  Discontinued        1,000 mg 200 mL/hr over 60 Minutes Intravenous Every 24 hours 08/26/23 1010 08/29/23 1311   08/26/23 0916  vancomycin (VANCOCIN) 1,000 mg, gentamicin  (GARAMYCIN ) 80 mg in sodium chloride  irrigation 0.9 % 3,000 mL irrigation  Status:  Discontinued          As needed 08/26/23 0918 08/26/23 0920   08/25/23 1745  vancomycin (VANCOREADY) IVPB 1500 mg/300 mL        1,500 mg 150 mL/hr over 120 Minutes Intravenous  Once 08/25/23 1651 08/25/23 2055   08/25/23 1745  piperacillin -tazobactam (ZOSYN ) IVPB 3.375 g  Status:  Discontinued        3.375 g 12.5 mL/hr over 240 Minutes Intravenous Every 8 hours 08/25/23 1651 08/29/23 1311        Subjective: Today, patient was seen and examined at bedside.  Patient states that his pain is better.  Has some cough.  Denies any fever chills nausea vomiting but has some loose  stools.  Objective: Vitals:   08/30/23 0733 08/30/23 0829  BP: 134/64   Pulse: 94 79  Resp:    Temp:    SpO2: 96% 96%    Intake/Output Summary (Last 24 hours) at 08/30/2023 1033 Last data filed at 08/30/2023 0839 Gross per 24 hour  Intake 1500 ml  Output 2115 ml  Net -615 ml   Filed Weights   08/29/23 0902  Weight: 80.1 kg   Body mass index is 27.64 kg/m.   Physical Exam: GENERAL: Patient is alert awake and oriented. Not in obvious distress. HENT: No scleral pallor or icterus. Pupils equally reactive to light. Oral mucosa is moist NECK: is supple, no gross swelling noted. CHEST: Clear to auscultation. No crackles or wheezes.  CVS: S1 and S2 heard, no murmur. Regular rate and rhythm.  ABDOMEN: Soft, non-tender, bowel sounds are present. EXTREMITIES: No edema.  Left groin with wound VAC. CNS: Cranial nerves are intact. No focal motor deficits. SKIN: warm and dry, left groin with wound VAC.  Data Review: I have personally reviewed the following laboratory data and studies,  CBC: Recent Labs  Lab 08/25/23 1633 08/26/23 0450 08/29/23 1610  08/30/23 0349  WBC 13.5* 11.5* 7.4 10.6*  HGB 12.4* 10.7* 10.1* 9.9*  HCT 39.2 33.7* 31.9* 31.8*  MCV 90.1 88.9 87.9 89.3  PLT 341 330 396 395   Basic Metabolic Panel: Recent Labs  Lab 08/26/23 0450 08/27/23 0953 08/28/23 0354 08/29/23 0432 08/30/23 0349  NA 136 134* 137 138 136  K 4.9 4.9 4.3 4.0 4.6  CL 105 105 106 111 110  CO2 19* 18* 21* 21* 22  GLUCOSE 193* 265* 168* 133* 319*  BUN 26* 33* 33* 25* 26*  CREATININE 1.62* 1.62* 1.75* 1.63* 1.36*  CALCIUM  8.8* 8.9 8.6* 8.5* 8.3*   Liver Function Tests: Recent Labs  Lab 08/25/23 1633  AST 17  ALT 10  ALKPHOS 60  BILITOT 0.8  PROT 7.3  ALBUMIN  3.0*   No results for input(s): "LIPASE", "AMYLASE" in the last 168 hours. No results for input(s): "AMMONIA" in the last 168 hours. Cardiac Enzymes: No results for input(s): "CKTOTAL", "CKMB", "CKMBINDEX", "TROPONINI"  in the last 168 hours. BNP (last 3 results) No results for input(s): "BNP" in the last 8760 hours.  ProBNP (last 3 results) Recent Labs    09/06/22 1216  PROBNP 3,744*    CBG: Recent Labs  Lab 08/29/23 0910 08/29/23 1203 08/29/23 1548 08/29/23 2059 08/30/23 0554  GLUCAP 123* 111* 399* 355* 267*   Recent Results (from the past 240 hours)  Surgical pcr screen     Status: Abnormal   Collection Time: 08/26/23  5:10 AM   Specimen: Nasal Mucosa; Nasal Swab  Result Value Ref Range Status   MRSA, PCR NEGATIVE NEGATIVE Final   Staphylococcus aureus POSITIVE (A) NEGATIVE Final    Comment: (NOTE) The Xpert SA Assay (FDA approved for NASAL specimens in patients 75 years of age and older), is one component of a comprehensive surveillance program. It is not intended to diagnose infection nor to guide or monitor treatment. Performed at [facility], [address], [place], [place] [address]   Aerobic/Anaerobic Culture w Gram Stain (surgical/deep wound)     Status: None (Preliminary result)   Collection Time: 08/26/23  9:20 AM   Specimen: Wound  Result Value Ref Range Status   Specimen Description WOUND  Final   Special Requests LEFT GROIN SWAB  Final   Gram Stain   Final    FEW WBC PRESENT, PREDOMINANTLY PMN FEW GRAM POSITIVE COCCI Performed at [facility], [address], [place], [place] [address]    Culture   Final    FEW STAPHYLOCOCCUS AUREUS NO ANAEROBES ISOLATED; CULTURE IN PROGRESS FOR 5 DAYS    Report Status PENDING  Incomplete   Organism ID, Bacteria STAPHYLOCOCCUS AUREUS  Final      Susceptibility   Staphylococcus aureus - MIC*    CIPROFLOXACIN <=0.5 SENSITIVE Sensitive     ERYTHROMYCIN <=0.25 SENSITIVE Sensitive     GENTAMICIN  <=0.5 SENSITIVE Sensitive     OXACILLIN 0.5 SENSITIVE Sensitive     TETRACYCLINE <=1 SENSITIVE Sensitive     VANCOMYCIN 1 SENSITIVE Sensitive     TRIMETH/SULFA <=10 SENSITIVE Sensitive     CLINDAMYCIN <=0.25 SENSITIVE  Sensitive     RIFAMPIN <=0.5 SENSITIVE Sensitive     Inducible Clindamycin NEGATIVE Sensitive     LINEZOLID 2 SENSITIVE Sensitive     * FEW STAPHYLOCOCCUS AUREUS     Studies: No results found.    Bridgid Printz, MD  [facility] 08/30/2023  If 7PM-7AM, please contact night-coverage

## 2023-08-30 NOTE — Consult Note (Addendum)
 [facility]  Total days of antibiotics 5               Reason for Consult: vascular graft infection    Referring Physician: cain  Principal Problem:   Infection of vascular bypass graft (HCC) Active Problems:   Atherosclerosis of native artery of extremity with intermittent claudication (HCC)   Coronary artery disease   DM2 (diabetes mellitus, type 2) (HCC)   AKI (acute kidney injury) (HCC)   Paroxysmal atrial fibrillation (HCC)   Essential hypertension   Hypercholesterolemia   PVD (peripheral vascular disease) (HCC)   CKD stage 3b, GFR 30-44 ml/min (HCC)   PAD (peripheral artery disease) (HCC)   Chronic combined systolic (congestive) and diastolic (congestive) heart failure (HCC)   GERD (gastroesophageal reflux disease)   BPH (benign prostatic hyperplasia)    HPI: [name] is a 78 y.o. male hx of T2DM, ABiF bypass with left to right fem-fem bypass who underwent recent angiography -cannulation of fem -fem bypass to evaluate right lower extremity acute on chronic ischemia-s/p right SFA and AT mechanical thrombectomy and right SFA drug coated balloon angioplasty on 07/10/23. On 5/9 started to have severe groin pain and erythema.U/s imaging concerning for fluid around proixmal portion of left to right fem-fem bypass.patient underwent wash out of left groin on 5/10-there was frank purulence about the graft on op note. Cultures growing MSSA. Patient had 2nd wash out on 5/13 and application of wound vac. ID asked to weigh in on management.  Past Medical History:  Diagnosis Date   Acute low back pain 01/12/2015   Acute lower limb ischemia 07/11/2023   Acute pain of both shoulders 07/22/2022   AKI (acute kidney injury) (HCC) 05/24/2022   Arthritis    Atherosclerosis of native arteries of the extremities with intermittent claudication 08/13/2013   Atherosclerosis of native artery of extremity with intermittent claudication (HCC) 08/13/2013   IMO SNOMED Dx  Update Oct 2024     Backache 02/08/2013   Benign prostatic hyperplasia with urinary frequency 02/23/2023   Bladder cancer (HCC)    resection x3   BMI 27.0-27.9,adult 02/23/2023   Cellulitis 07/17/2023   Chronic combined systolic (congestive) and diastolic (congestive) heart failure (HCC) 07/22/2022   Chronic left shoulder pain 05/24/2023   Chronic right shoulder pain 09/02/2022   Chronic systolic CHF (congestive heart failure) (HCC)    CKD stage 3b, GFR 30-44 ml/min (HCC) 07/03/2023   Coronary artery disease    status post DMI RX Taxus stent RCA 2006 with susequent Stent LAD and subsequent  stent thrombosis RCA unable to be opened 2006 -neg mv 10/2008, 10/16/17 ISR to pLAD with PTCA/DES, CTO of RCA with collaterals, EF 25%   Coronary artery disease involving native coronary artery of native heart with unstable angina pectoris (HCC)    Current moderate episode of major depressive disorder without prior episode (HCC) 03/28/2022   Diverticulitis 06/10/2022   Diverticulitis large intestine 05/11/2021   Diverticulitis of large intestine with abscess 05/24/2022   Diverticulitis of sigmoid colon 05/11/2021   DM2 (diabetes mellitus, type 2) (HCC) 02/09/2022   status post bilateral aortobifemoral bypass [id number] with recent fem to fembypass April  2011 per DR. Early     Dyssynergic defecation 03/21/2023   Essential hypertension 07/22/2022   Fatigue 03/13/2023   History of bladder cancer 02/23/2023   History of colonic diverticulitis 03/21/2023   Hypercholesterolemia 10/28/2022   HYPERLIPIDEMIA-MIXED 09/22/2008   Qualifier: Diagnosis of  By: Jaramillo, Luz  Hypothyroidism 05/24/2022   Ischemic cardiomyopathy    ejection fraction of 40-45%   Lethargy 03/28/2022   Lumbar spinal stenosis    Myocardial infarction (HCC)    "I've had 4" (10/16/2017)   Need for prophylactic vaccination and inoculation against influenza 03/28/2022   NSVT (nonsustained ventricular tachycardia) (HCC)    PAD  (peripheral artery disease) (HCC) 07/17/2023   PAF (paroxysmal atrial fibrillation) (HCC) 02/09/2022   Paroxysmal atrial fibrillation (HCC)    Peripheral artery disease (HCC) 07/09/2023   Poorly controlled T2 diabetes mellitus (HCC) 09/27/2022   Protein-calorie malnutrition, severe 05/29/2022   PVC's (premature ventricular contractions)    PVD 09/22/2008   Qualifier: Diagnosis of  By: Malon Seamen     PVD (peripheral vascular disease) (HCC) 02/23/2023   Rotator cuff arthropathy of right shoulder 09/27/2022   Sigmoid diverticulitis 06/30/2023   Status post coronary artery stent placement    Status post Hartmann's procedure (HCC) 03/21/2023   TOBACCO ABUSE 05/29/2009   Qualifier: Diagnosis of  By: Emma Hardy, RN, BSN, Levern Reader    Trigger ring finger of right hand 03/29/2023   Type 2 diabetes mellitus with other circulatory complications (HCC) 07/22/2022   Type II diabetes mellitus (HCC)    Unstable angina (HCC) 10/16/2017    Allergies:  Allergies  Allergen Reactions   Lisinopril  Swelling and Rash    Rash - face and tounge swell   Levaquin [Levofloxacin]     Current antibiotics:   MEDICATIONS:  aspirin  EC  81 mg Oral Daily   carvedilol   12.5 mg Oral BID WC   ezetimibe   10 mg Oral Daily   insulin  aspart  0-20 Units Subcutaneous TID WC   insulin  aspart  12 Units Subcutaneous TID WC   insulin  glargine-yfgn  40 Units Subcutaneous QHS   pantoprazole   40 mg Oral Daily   Rivaroxaban   15 mg Oral Q supper   rosuvastatin   40 mg Oral Daily   tamsulosin   0.8 mg Oral QPM    Social History   Tobacco Use   Smoking status: Former    Current packs/day: 0.00    Average packs/day: 0.5 packs/day for 56.0 years (28.0 ttl pk-yrs)    Types: Cigars, Cigarettes    Start date: 1966    Quit date: 2022    Years since quitting: 3.3   Smokeless tobacco: Former    Types: Associate Professor status: Never Used  Substance Use Topics   Alcohol use: Yes    Comment: Rarely 1 beer    Drug use: Never    Family History  Problem Relation Age of Onset   Coronary artery disease Unknown    Diabetes Unknown    Cardiomyopathy Mother    Heart disease Mother    Hyperlipidemia Mother    Coronary artery disease Father    Stroke Father    Diabetes Father    Heart disease Father        before age 34   Hyperlipidemia Father    Heart attack Father    Diabetes Sister    Heart disease Sister        before age 5   Hyperlipidemia Sister    Heart attack Sister     Review of Systems -  Groin pain improved since admission. 12 point ros is otherwise negative  OBJECTIVE: Temp:  [98 F (36.7 C)-98.5 F (36.9 C)] 98 F (36.7 C) (05/14 1154) Pulse Rate:  [79-94] 81 (05/14 1154) Resp:  [14-16] 16 (05/14 0356) BP: (  107-155)/(58-74) 137/63 (05/14 1154) SpO2:  [93 %-97 %] 94 % (05/14 1154) Physical Exam  Constitutional: He is oriented to person, place, and time. He appears well-developed and well-nourished. No distress.  HENT:  Mouth/Throat: Oropharynx is clear and moist. No oropharyngeal exudate.  Cardiovascular: Normal rate, regular rhythm and normal heart sounds. Exam reveals no gallop and no friction rub.  No murmur heard.  Pulmonary/Chest: Effort normal and breath sounds normal. No respiratory distress. He has no wheezes.  Abdominal: Soft. Bowel sounds are normal. He exhibits no distension. There is no tenderness. Left sided wound vac in place Lymphadenopathy:  He has no cervical adenopathy.  Neurological: He is alert and oriented to person, place, and time.  Skin: Skin is warm and dry. No rash noted. No erythema.  Psychiatric: He has a normal mood and affect. His behavior is normal.   LABS: Results for orders placed or performed during the hospital encounter of 08/25/23 (from the past 48 hours)  Urinalysis, Complete w Microscopic -Urine, Clean Catch     Status: Abnormal   Collection Time: 08/28/23  2:44 PM  Result Value Ref Range   Color, Urine STRAW (A) YELLOW    APPearance CLEAR CLEAR   Specific Gravity, Urine 1.014 1.005 - 1.030   pH 5.0 5.0 - 8.0   Glucose, UA >=500 (A) NEGATIVE mg/dL   Hgb urine dipstick NEGATIVE NEGATIVE   Bilirubin Urine NEGATIVE NEGATIVE   Ketones, ur NEGATIVE NEGATIVE mg/dL   Protein, ur NEGATIVE NEGATIVE mg/dL   Nitrite NEGATIVE NEGATIVE   Leukocytes,Ua SMALL (A) NEGATIVE   RBC / HPF 0-5 0 - 5 RBC/hpf   WBC, UA 0-5 0 - 5 WBC/hpf   Bacteria, UA RARE (A) NONE SEEN   Squamous Epithelial / HPF 0-5 0 - 5 /HPF    Comment: Performed at [facility], [address] [place], [place] [address]  Sodium, urine, random     Status: None   Collection Time: 08/28/23  2:44 PM  Result Value Ref Range   Sodium, Ur 87 mmol/L    Comment: Performed at [facility], [address], [place], [place] [address]  Creatinine, urine, random     Status: None   Collection Time: 08/28/23  2:44 PM  Result Value Ref Range   Creatinine, Urine 61 mg/dL    Comment: Performed at [facility], [address] [place], [place] [address]  Glucose, capillary     Status: None   Collection Time: 08/28/23  3:46 PM  Result Value Ref Range   Glucose-Capillary 84 70 - 99 mg/dL    Comment: Glucose reference range applies only to samples taken after fasting for at least 8 hours.  Glucose, capillary     Status: Abnormal   Collection Time: 08/28/23  3:49 PM  Result Value Ref Range   Glucose-Capillary 127 (H) 70 - 99 mg/dL    Comment: Glucose reference range applies only to samples taken after fasting for at least 8 hours.  Glucose, capillary     Status: Abnormal   Collection Time: 08/28/23  9:21 PM  Result Value Ref Range   Glucose-Capillary 112 (H) 70 - 99 mg/dL    Comment: Glucose reference range applies only to samples taken after fasting for at least 8 hours.   Comment 1 Notify RN    Comment 2 Document in Chart   Basic metabolic panel     Status: Abnormal   Collection Time: 08/29/23  4:32 AM  Result Value  Ref Range   Sodium 138 135 -  145 mmol/L   Potassium 4.0 3.5 - 5.1 mmol/L   Chloride 111 98 - 111 mmol/L   CO2 21 (L) 22 - 32 mmol/L   Glucose, Bld 133 (H) 70 - 99 mg/dL    Comment: Glucose reference range applies only to samples taken after fasting for at least 8 hours.   BUN 25 (H) 8 - 23 mg/dL   Creatinine, Ser 2.84 (H) 0.61 - 1.24 mg/dL   Calcium  8.5 (L) 8.9 - 10.3 mg/dL   GFR, Estimated 43 (L) >60 mL/min    Comment: (NOTE) Calculated using the CKD-EPI Creatinine Equation (2021)    Anion gap 6 5 - 15    Comment: Performed at [facility], [address], [place], [place] [address]  CBC     Status: Abnormal   Collection Time: 08/29/23  4:32 AM  Result Value Ref Range   WBC 7.4 4.0 - 10.5 K/uL   RBC 3.63 (L) 4.22 - 5.81 MIL/uL   Hemoglobin 10.1 (L) 13.0 - 17.0 g/dL   HCT 01.0 (L) 27.2 - 53.6 %   MCV 87.9 80.0 - 100.0 fL   MCH 27.8 26.0 - 34.0 pg   MCHC 31.7 30.0 - 36.0 g/dL   RDW 64.4 03.4 - 74.2 %   Platelets 396 150 - 400 K/uL   nRBC 0.0 0.0 - 0.2 %    Comment: Performed at [facility], [address] [place], [place] [address]  Glucose, capillary     Status: Abnormal   Collection Time: 08/29/23  6:05 AM  Result Value Ref Range   Glucose-Capillary 133 (H) 70 - 99 mg/dL    Comment: Glucose reference range applies only to samples taken after fasting for at least 8 hours.   Comment 1 Notify RN    Comment 2 Document in Chart   Glucose, capillary     Status: Abnormal   Collection Time: 08/29/23  9:10 AM  Result Value Ref Range   Glucose-Capillary 123 (H) 70 - 99 mg/dL    Comment: Glucose reference range applies only to samples taken after fasting for at least 8 hours.  Glucose, capillary     Status: Abnormal   Collection Time: 08/29/23 12:03 PM  Result Value Ref Range   Glucose-Capillary 111 (H) 70 - 99 mg/dL    Comment: Glucose reference range applies only to samples taken after fasting for at least 8 hours.   Comment 1 Notify RN   Glucose, capillary     Status: Abnormal   Collection  Time: 08/29/23  3:48 PM  Result Value Ref Range   Glucose-Capillary 399 (H) 70 - 99 mg/dL    Comment: Glucose reference range applies only to samples taken after fasting for at least 8 hours.  Glucose, capillary     Status: Abnormal   Collection Time: 08/29/23  8:59 PM  Result Value Ref Range   Glucose-Capillary 355 (H) 70 - 99 mg/dL    Comment: Glucose reference range applies only to samples taken after fasting for at least 8 hours.   Comment 1 Notify RN    Comment 2 Document in Chart   Basic metabolic panel     Status: Abnormal   Collection Time: 08/30/23  3:49 AM  Result Value Ref Range   Sodium 136 135 - 145 mmol/L   Potassium 4.6 3.5 - 5.1 mmol/L   Chloride 110 98 - 111 mmol/L   CO2 22 22 -  32 mmol/L   Glucose, Bld 319 (H) 70 - 99 mg/dL    Comment: Glucose reference range applies only to samples taken after fasting for at least 8 hours.   BUN 26 (H) 8 - 23 mg/dL   Creatinine, Ser 0.10 (H) 0.61 - 1.24 mg/dL   Calcium  8.3 (L) 8.9 - 10.3 mg/dL   GFR, Estimated 53 (L) >60 mL/min    Comment: (NOTE) Calculated using the CKD-EPI Creatinine Equation (2021)    Anion gap 4 (L) 5 - 15    Comment: Performed at [facility], [address] [place], [place] [address]  CBC     Status: Abnormal   Collection Time: 08/30/23  3:49 AM  Result Value Ref Range   WBC 10.6 (H) 4.0 - 10.5 K/uL   RBC 3.56 (L) 4.22 - 5.81 MIL/uL   Hemoglobin 9.9 (L) 13.0 - 17.0 g/dL   HCT 66.4 (L) 40.3 - 47.4 %   MCV 89.3 80.0 - 100.0 fL   MCH 27.8 26.0 - 34.0 pg   MCHC 31.1 30.0 - 36.0 g/dL   RDW 25.9 56.3 - 87.5 %   Platelets 395 150 - 400 K/uL   nRBC 0.0 0.0 - 0.2 %    Comment: Performed at [facility], [address] [place], [place] [address]  Glucose, capillary     Status: Abnormal   Collection Time: 08/30/23  5:54 AM  Result Value Ref Range   Glucose-Capillary 267 (H) 70 - 99 mg/dL    Comment: Glucose reference range applies only to samples taken after fasting for at least 8 hours.    Comment 1 Notify RN    Comment 2 Document in Chart   Glucose, capillary     Status: Abnormal   Collection Time: 08/30/23 11:48 AM  Result Value Ref Range   Glucose-Capillary 114 (H) 70 - 99 mg/dL    Comment: Glucose reference range applies only to samples taken after fasting for at least 8 hours.    MICRO:       Staphylococcus aureus     MIC   CIPROFLOXACIN <=0.5 SENSI... Sensitive   CLINDAMYCIN <=0.25 SENS... Sensitive   ERYTHROMYCIN <=0.25 SENS... Sensitive   GENTAMICIN  <=0.5 SENSI... Sensitive   Inducible Clindamycin NEGATIVE Sensitive   LINEZOLID 2 SENSITIVE Sensitive   OXACILLIN 0.5 SENSITIVE Sensitive   RIFAMPIN <=0.5 SENSI... Sensitive   TETRACYCLINE <=1 SENSITIVE Sensitive   TRIMETH/SULFA <=10 SENSIT... Sensitive   VANCOMYCIN 1 SENSITIVE Sensitive     IMAGING: No results found.  HISTORICAL MICRO/IMAGING  Assessment/Plan:  78yo M with MSSA vascular graft infection, graft remains intact.   -plan to get picc line and do 6 wk of iv abtx with cefazolin 2gm IV q 8hr then transition to oral abtx for 4.5 months at minimum while we monitor his wound healing and lab markers - will check sed rate and crp - plan to treat addition of rifampin for 300mg  po bid with cefazolin. - we will review his medicaitons to see no drug interactions with rifampin before we start. - we'll plan to coordinate with home health to get teaching and arrangements with patient and his girlfriend who is a retired Engineer, civil (consulting)  Type 2 dm = poorly controlled, at 10.6, recommend to have patient have better blood sugar control to help with would healing.  Diagnosis: Vascular graft infection  Culture Result: MSSA  Allergies  Allergen Reactions   Lisinopril  Swelling and Rash    Rash - face and tounge swell  Levaquin [Levofloxacin]     OPAT Orders Discharge antibiotics to be given via PICC line Discharge antibiotics: Per pharmacy protocol cefazolin 2gm iv q 8hr  Duration: 6 wk End Date: June  24  [facility] Care Per Protocol:  Home health RN for IV administration and teaching; PICC line care and labs.    Labs weekly while on IV antibiotics: _x_ CBC with differential _x_ BMP  _x_ CRP _x_ ESR   __x Please pull PIC at completion of IV antibiotics   Fax weekly labs to [phone]  Clinic Follow Up Appt: 4-6 wk  @ [facility]  -evaluation of this patient requires complex antimicrobial therapy evaluation and counseling and isolation needs for disease transmission risk assessment and mitigation.   Gerold Kos Levern Reader MD MPH [facility] [phone]

## 2023-08-31 DIAGNOSIS — T827XXA Infection and inflammatory reaction due to other cardiac and vascular devices, implants and grafts, initial encounter: Secondary | ICD-10-CM | POA: Diagnosis not present

## 2023-08-31 DIAGNOSIS — T827XXD Infection and inflammatory reaction due to other cardiac and vascular devices, implants and grafts, subsequent encounter: Secondary | ICD-10-CM | POA: Diagnosis not present

## 2023-08-31 DIAGNOSIS — B9561 Methicillin susceptible Staphylococcus aureus infection as the cause of diseases classified elsewhere: Secondary | ICD-10-CM | POA: Diagnosis not present

## 2023-08-31 DIAGNOSIS — A4901 Methicillin susceptible Staphylococcus aureus infection, unspecified site: Secondary | ICD-10-CM

## 2023-08-31 LAB — BASIC METABOLIC PANEL WITH GFR
Anion gap: 7 (ref 5–15)
BUN: 21 mg/dL (ref 8–23)
CO2: 23 mmol/L (ref 22–32)
Calcium: 8.4 mg/dL — ABNORMAL LOW (ref 8.9–10.3)
Chloride: 108 mmol/L (ref 98–111)
Creatinine, Ser: 1.44 mg/dL — ABNORMAL HIGH (ref 0.61–1.24)
GFR, Estimated: 50 mL/min — ABNORMAL LOW (ref >60)
Glucose, Bld: 208 mg/dL — ABNORMAL HIGH (ref 70–99)
Potassium: 4 mmol/L (ref 3.5–5.1)
Sodium: 138 mmol/L (ref 135–145)

## 2023-08-31 LAB — GLUCOSE, CAPILLARY
Glucose-Capillary: 165 mg/dL — ABNORMAL HIGH (ref 70–99)
Glucose-Capillary: 110 mg/dL — ABNORMAL HIGH (ref 70–99)
Glucose-Capillary: 226 mg/dL — ABNORMAL HIGH (ref 70–99)
Glucose-Capillary: 179 mg/dL — ABNORMAL HIGH (ref 70–99)

## 2023-08-31 LAB — CBC
HCT: 31.7 % — ABNORMAL LOW (ref 39.0–52.0)
Hemoglobin: 9.7 g/dL — ABNORMAL LOW (ref 13.0–17.0)
MCH: 27.6 pg (ref 26.0–34.0)
MCHC: 30.6 g/dL (ref 30.0–36.0)
MCV: 90.3 fL (ref 80.0–100.0)
Platelets: 410 10*3/uL — ABNORMAL HIGH (ref 150–400)
RBC: 3.51 MIL/uL — ABNORMAL LOW (ref 4.22–5.81)
RDW: 15.5 % (ref 11.5–15.5)
WBC: 7.6 10*3/uL (ref 4.0–10.5)
nRBC: 0 % (ref 0.0–0.2)

## 2023-08-31 LAB — AEROBIC/ANAEROBIC CULTURE W GRAM STAIN (SURGICAL/DEEP WOUND)

## 2023-08-31 LAB — MAGNESIUM: Magnesium: 1.9 mg/dL (ref 1.7–2.4)

## 2023-08-31 LAB — SEDIMENTATION RATE: Sed Rate: 49 mm/h — ABNORMAL HIGH (ref 0–16)

## 2023-08-31 LAB — C-REACTIVE PROTEIN: CRP: 1.4 mg/dL — ABNORMAL HIGH (ref <1.0)

## 2023-08-31 MED ORDER — AMLODIPINE BESYLATE 5 MG PO TABS
5.0000 mg | ORAL_TABLET | Freq: Every day | ORAL | Status: DC
  Administered 2023-08-31 – 2023-09-01 (×2): 5 mg via ORAL
  Filled 2023-08-31 (×2): qty 1

## 2023-08-31 MED ORDER — CHLORHEXIDINE GLUCONATE CLOTH 2 % EX PADS
6.0000 | MEDICATED_PAD | Freq: Every day | CUTANEOUS | Status: DC
  Administered 2023-08-31 – 2023-09-01 (×2): 6 via TOPICAL

## 2023-08-31 MED ORDER — ISOSORBIDE DINITRATE 10 MG PO TABS
10.0000 mg | ORAL_TABLET | Freq: Two times a day (BID) | ORAL | Status: DC
  Administered 2023-08-31 – 2023-09-01 (×2): 10 mg via ORAL
  Filled 2023-08-31 (×5): qty 1

## 2023-08-31 MED ORDER — SODIUM CHLORIDE 0.9% FLUSH
10.0000 mL | Freq: Two times a day (BID) | INTRAVENOUS | Status: DC
Start: 2023-08-31 — End: 2023-09-01
  Administered 2023-08-31 – 2023-09-01 (×3): 10 mL

## 2023-08-31 MED ORDER — SODIUM CHLORIDE 0.9% FLUSH: 10.0000 mL | INTRAVENOUS | Status: DC | PRN

## 2023-08-31 NOTE — Care Management Important Message (Signed)
 Important Message  Patient Details  Name: [name] MRN: [id number] Date of Birth: 09-02-1945   Important Message Given:  Yes - Medicare IM     Janith Melnick 08/31/2023, 9:23 AM

## 2023-08-31 NOTE — Plan of Care (Signed)

## 2023-08-31 NOTE — Progress Notes (Addendum)
 [facility]    Date of Admission:  08/25/2023   Total days of antibiotics 7   ID: [name] is a 78 y.o. 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 Tolerated picc line placement to right arm without difficulty. Spoke to his girlfriend at bedside about the upcoming plan for treatment  Medications:   amLODipine   5 mg Oral Daily   aspirin  EC  81 mg Oral Daily   carvedilol   12.5 mg Oral BID WC   Chlorhexidine  Gluconate Cloth  6 each Topical Daily   ezetimibe   10 mg Oral Daily   insulin  aspart  0-20 Units Subcutaneous TID WC   insulin  aspart  12 Units Subcutaneous TID WC   insulin  glargine-yfgn  40 Units Subcutaneous QHS   isosorbide dinitrate  10 mg Oral BID   pantoprazole   40 mg Oral Daily   Rivaroxaban   15 mg Oral Q supper   rosuvastatin   40 mg Oral Daily   sodium chloride  flush  10-40 mL Intracatheter Q12H   tamsulosin   0.8 mg Oral QPM    Objective: Vital signs in last 24 hours: Temp:  [97.4 F (36.3 C)-98.3 F (36.8 C)] 97.9 F (36.6 C) (05/15 1114) Pulse Rate:  [76-84] 83 (05/15 0859) Resp:  [13-19] 16 (05/15 1114) BP: (109-157)/(48-91) 109/66 (05/15 1339) SpO2:  [92 %-97 %] 97 % (05/15 0859)  Physical Exam  Constitutional: He is oriented to person, place, and time. He appears  well-developed and well-nourished. No distress.  HENT:  Mouth/Throat: Oropharynx is clear and moist. No oropharyngeal exudate.  Cardiovascular: Normal rate, regular rhythm and normal heart sounds. Exam reveals no gallop and no friction rub.  No murmur heard.  Pulmonary/Chest: Effort normal and breath sounds normal. No respiratory distress. He has no wheezes.  Abdominal: Soft. Bowel sounds are normal. He exhibits no distension. There is no tenderness.  Left groin wound vac in place Neurological: He is alert and oriented to person, place, and time.  Skin: Skin is warm and dry. No rash noted. No erythema.  Psychiatric: He has a normal mood and affect. His behavior is normal.    Lab Results Recent Labs    08/30/23 0349 08/31/23 0409  WBC 10.6* 7.6  HGB 9.9* 9.7*  HCT 31.8* 31.7*  NA 136 138  K 4.6 4.0  CL 110 108  CO2 22 23  BUN 26* 21  CREATININE 1.36* 1.44*    Microbiology:  Studies/Results: ECHOCARDIOGRAM COMPLETE BUBBLE STUDY Result Date: 08/30/2023    ECHOCARDIOGRAM REPORT   Patient Name:   FAOLAN HONKOMP Date of Exam: 08/30/2023 Medical Rec #:  [id number]     Height:       67.0 in Accession #:    [id number]    Weight:       176.5 lb Date of Birth:  [birth year]  BSA:          1.918 m Patient Age:    77 years      BP:           134/64 mmHg Patient Gender: M             HR:           79 bpm. Exam Location:  Inpatient Procedure: 2D Echo, Cardiac Doppler, Color Doppler and Intracardiac            Opacification Agent (Both Spectral and Color Flow Doppler were            utilized during procedure). Indications:    Murmur  History:        Patient has prior history of Echocardiogram examinations, most                 recent 05/23/2023. Cardiomyopathy and CHF, Previous Myocardial                 Infarction and CAD, COPD, Arrythmias:Atrial Fibrillation and                 PVC, Signs/Symptoms:Fatigue; Risk Factors:Diabetes, Dyslipidemia                 and Former Smoker. PVD, CKD stage 3.   Sonographer:    Aldon Ambrosia Referring Phys: [phone] Rayyan Orsborn  Sonographer Comments: Technically difficult study due to poor echo windows. IMPRESSIONS  1. Poor image quality for valve evaluation.  2. Left ventricular ejection fraction, by estimation, is 35 to 40%. The left ventricle has moderately decreased function. The left ventricle demonstrates regional wall motion abnormalities (see scoring diagram/findings for description). There is mild left ventricular hypertrophy. Left ventricular diastolic parameters are indeterminate.  3. Right ventricular systolic function is normal. The right ventricular size is normal. Tricuspid regurgitation signal is inadequate for assessing PA pressure.  4. The mitral valve is degenerative. Trivial mitral valve regurgitation. No evidence of mitral stenosis.  5. The aortic valve is abnormal. There is moderate calcification of the aortic valve. Aortic valve regurgitation is not visualized. Aortic valve sclerosis/calcification is present, without any evidence of aortic stenosis.  6. The inferior vena cava is normal in size with greater than 50% respiratory variability, suggesting right atrial pressure of 3 mmHg. FINDINGS  Left Ventricle: Left ventricular ejection fraction, by estimation, is 35 to 40%. The left ventricle has moderately decreased function. The left ventricle demonstrates regional wall motion abnormalities. The left ventricular internal cavity size was normal in size. There is mild left ventricular hypertrophy. Left ventricular diastolic parameters are indeterminate.  LV Wall Scoring: The inferior septum is akinetic. The anterior septum and entire inferior wall are hypokinetic. Right Ventricle: The right ventricular size is normal. No increase in right ventricular wall thickness. Right ventricular systolic function is normal. Tricuspid regurgitation signal is inadequate for assessing PA pressure. Left Atrium: Left atrial size was normal in size. Right Atrium: Right  atrial size was normal in size. Pericardium: There is no evidence of pericardial effusion. Presence of epicardial fat layer. Mitral Valve: The mitral valve is degenerative in appearance. Trivial mitral valve regurgitation. No evidence of mitral valve stenosis. MV peak gradient, 2.7 mmHg. The mean mitral valve gradient is 1.0 mmHg. Tricuspid Valve: The tricuspid valve is not well visualized. Tricuspid valve regurgitation is trivial. Aortic Valve: The aortic valve is abnormal. There is moderate calcification of the aortic valve. Aortic valve regurgitation is not visualized. Aortic valve sclerosis/calcification is present, without any  evidence of aortic stenosis. Aortic valve mean gradient measures 6.0 mmHg. Aortic valve peak gradient measures 10.6 mmHg. Aortic valve area, by VTI measures 1.28 cm. Pulmonic Valve: The pulmonic valve was not well visualized. Pulmonic valve regurgitation is not visualized. Aorta: The aortic root is normal in size and structure. Venous: The inferior vena cava is normal in size with greater than 50% respiratory variability, suggesting right atrial pressure of 3 mmHg. IAS/Shunts: The interatrial septum was not well visualized.  LEFT VENTRICLE PLAX 2D LVIDd:         5.30 cm      Diastology LVIDs:         4.00 cm      LV e' medial:    8.27 cm/s LV PW:         1.00 cm      LV E/e' medial:  11.7 LV IVS:        1.00 cm      LV e' lateral:   10.40 cm/s LVOT diam:     1.90 cm      LV E/e' lateral: 9.3 LV SV:         41 LV SV Index:   21 LVOT Area:     2.84 cm  LV Volumes (MOD) LV vol d, MOD A2C: 259.0 ml LV vol d, MOD A4C: 222.0 ml LV vol s, MOD A2C: 128.0 ml LV vol s, MOD A4C: 123.0 ml LV SV MOD A2C:     131.0 ml LV SV MOD A4C:     222.0 ml LV SV MOD BP:      114.9 ml RIGHT VENTRICLE RV Basal diam:  3.70 cm RV S prime:     9.57 cm/s TAPSE (M-mode): 2.1 cm LEFT ATRIUM             Index        RIGHT ATRIUM           Index LA diam:        3.40 cm 1.77 cm/m   RA Area:     15.60 cm LA Vol (A2C):    55.6 ml 28.99 ml/m  RA Volume:   38.30 ml  19.97 ml/m LA Vol (A4C):   59.9 ml 31.24 ml/m LA Biplane Vol: 58.9 ml 30.72 ml/m  AORTIC VALVE                     PULMONIC VALVE AV Area (Vmax):    1.37 cm      PV Vmax:       1.27 m/s AV Area (Vmean):   1.25 cm      PV Peak grad:  6.5 mmHg AV Area (VTI):     1.28 cm AV Vmax:           163.00 cm/s AV Vmean:          109.000 cm/s AV VTI:            0.319 m AV Peak Grad:      10.6 mmHg AV Mean Grad:      6.0 mmHg LVOT Vmax:         79.00 cm/s LVOT Vmean:        48.200 cm/s LVOT VTI:          0.144 m LVOT/AV VTI ratio: 0.45  AORTA Ao Root diam: 3.00 cm Ao Asc diam:  3.00 cm MITRAL VALVE MV Area (PHT): 3.43 cm    SHUNTS MV Area VTI:   2.21 cm  Systemic VTI:  0.14 m MV Peak grad:  2.7 mmHg    Systemic Diam: 1.90 cm MV Mean grad:  1.0 mmHg MV Vmax:       0.81 m/s MV Vmean:      52.4 cm/s MV Decel Time: 221 msec MV E velocity: 96.90 cm/s MV A velocity: 88.00 cm/s MV E/A ratio:  1.10 Grady Lawman MD Electronically signed by Grady Lawman MD Signature Date/Time: 08/30/2023/5:03:43 PM    Final    US  EKG SITE RITE Result Date: 08/30/2023 If Site Rite image not attached, placement could not be confirmed due to current cardiac rhythm.    Assessment/Plan: MSSA vascular graft infection = plan for 6 wk of cefazolin 2gm IV q 8hr. Will follow kidney function. Will not add rifampin due to drug interaction with blood thinner. We will then convert to cefadroxil 1000 mg po x 4.5 months to finish out course of treatment  -will check sed rate and crp  -opat orders noted on 5/14 note  - will see back in the ID clinic in 5 wk  Will sign off.  [facility] Pager: [phone]  08/31/2023, 2:18 PM

## 2023-08-31 NOTE — Progress Notes (Addendum)
 PROGRESS NOTE  [name] ZOX:[id number] DOB: 1945-09-24 DOA: 08/25/2023 PCP: Dennis Haines, MD   LOS: 6 days   Brief narrative:  [name] is a 78 y.o. male with past medical history of PAD s/p fem-fem bypass, type 2 diabetes, hypertension, coronary artery disease status post PTCA, chronic heart failure with reduced ejection fraction, atrial fibrillation, hyperlipidemia, GERD, PTSD and CKD stage IIIa presented to the hospital initially with left groin pain.  Of note patient has history of  s/p right SFA and AT mechanical thrombectomy and right SFA drug-coated balloon angioplasty on 07/10/2023.  This tim, presented with left groin severe pain. CTA revealed stranding around left groin suggestive of inflammation versus infection. Patient was subsequently discharged from ED. Pain did not improve after discharge and patient seen by vascular surgery on 5/9. Ultrasound showed fluid surrounding proximal portion of left to right fem-fem bypass (not evident on prior CTA), concerning for infection at left groin cath site and was admitted to vascular surgery service for further evaluation and treatment.  Medical team was consulted for management of hyperglycemia.       Assessment/Plan: Principal Problem:   Infection of vascular bypass graft (HCC) Active Problems:   AKI (acute kidney injury) (HCC)   DM2 (diabetes mellitus, type 2) (HCC)   Essential hypertension   CKD stage 3b, GFR 30-44 ml/min (HCC)   Hypercholesterolemia   Atherosclerosis of native artery of extremity with intermittent claudication (HCC)   Coronary artery disease   Paroxysmal atrial fibrillation (HCC)   PVD (peripheral vascular disease) (HCC)   PAD (peripheral artery disease) (HCC)   Chronic combined systolic (congestive) and diastolic (congestive) heart failure (HCC)   GERD (gastroesophageal reflux disease)   BPH (benign prostatic hyperplasia)  uncontrolled type 2 diabetes mellitus with hyperglycemia -Hemoglobin A1c 10.6  (06/30/2023).  Patient on Jardiance  10 mg daily and Lantus  35 units at home.  Continue Semglee  40 units,, NovoLog  12 units 3 times daily with meals and sliding scale insulin  at this time.  Latest POC glucose of 110.  Improved.  Recommend Lantus  pain 40 units at nighttime, NovoLog  FlexPen 12 units 3 times daily with meals on discharge.   Hypotension  Blood pressure is improved.  Will resume amlodipine  and Isordil.  Continue to hold Entresto  for now.  .  Patient on Norvasc , Entresto , Coreg , Imdur prior to admission.    AKI on CKD stage IIIa -Baseline creatinine approximately 1.2-1.35.  Creatinine today at 1.4  Entresto   on hold and received IV fluids.  Renal ultrasound negative for hydronephrosis but echogenic kidneys.  On IV Ancef.  Was on vancomycin which has been discontinued. Will continue to monitor renal function.   Infected femorofemoral bypass graft/PAD status post left groin washout, primary repair of graftotomy, application of Kerecis, partial closure and VAC placement 08/26/2023 per Dr. Vikki Nguyen - Status post right SFA and AT mechanical thrombectomy and right SFA drug-coated balloon angioplasty on 07/10/2023.  Patient was admitted to vascular surgery and underwent left groin washout initially followed by repeat intervention on 08/29/2023.  Status post wound VAC placement. Left groin culture/Gram stain with MSSA.  On IV Ancef at this time.  Continue Crestor  Zithromax.  ID on board.  Status post PICC line placement.  CAD status post PTCA/DES to proximal LAD -Patient on aspirin , Coreg , Imdur, Entresto , Norvasc  prior to admission.  Continue Crestor  and Coreg , resume Isordil   Chronic HFrEF -Compensated. Review of latest 2D echo EF (05/23/2023 ) of 45 to 50%, regional wall motion abnormalities, mild  LVH.  Resume nitrates, continue Coreg  aspirin .   Paroxysmal A-fib On Coreg .  Xarelto  has been resumed.    Hyperlipidemia - Continue Crestor , Zetia .   GERD - Continue PPI.    BPH - Continue  Flomax .  DVT prophylaxis: Place TED hose Start: 08/25/23 1614 Rivaroxaban  (XARELTO ) tablet 15 mg   Disposition: As per primary team.  Status is: Inpatient Remains inpatient appropriate because: Status post vascular intervention, IV antibiotics, hyperglycemia    Code Status:     Code Status: Full Code  Family Communication: None at bedside   Procedures: ABI with TBI 08/25/2023  Left groin intervention 5/10 and 08/29/2023 Right upper extremity PICC line  Anti-infectives:  Cefazolin IV  Anti-infectives (From admission, onward)    Start     Dose/Rate Route Frequency Ordered Stop   08/29/23 1600  ceFAZolin (ANCEF) IVPB 2g/100 mL premix        2 g 200 mL/hr over 30 Minutes Intravenous Every 8 hours 08/29/23 1311     08/26/23 1800  Vancomycin (VANCOCIN) 1,250 mg in sodium chloride  0.9 % 250 mL IVPB  Status:  Discontinued        1,250 mg 166.7 mL/hr over 90 Minutes Intravenous Every 24 hours 08/25/23 1656 08/26/23 1010   08/26/23 1800  vancomycin (VANCOCIN) IVPB 1000 mg/200 mL premix  Status:  Discontinued        1,000 mg 200 mL/hr over 60 Minutes Intravenous Every 24 hours 08/26/23 1010 08/29/23 1311   08/26/23 0916  vancomycin (VANCOCIN) 1,000 mg, gentamicin  (GARAMYCIN ) 80 mg in sodium chloride  irrigation 0.9 % 3,000 mL irrigation  Status:  Discontinued          As needed 08/26/23 0918 08/26/23 0920   08/25/23 1745  vancomycin (VANCOREADY) IVPB 1500 mg/300 mL        1,500 mg 150 mL/hr over 120 Minutes Intravenous  Once 08/25/23 1651 08/25/23 2055   08/25/23 1745  piperacillin -tazobactam (ZOSYN ) IVPB 3.375 g  Status:  Discontinued        3.375 g 12.5 mL/hr over 240 Minutes Intravenous Every 8 hours 08/25/23 1651 08/29/23 1311        Subjective: Today, patient was seen and examined at bedside.  Patient states that he feels okay.  Denies any nausea vomiting fever chills or rigors.   Objective: Vitals:   08/31/23 0324 08/31/23 0859  BP: (!) 157/66 (!) 151/83  Pulse: 79 83   Resp: 15 13  Temp: 97.8 F (36.6 C) (!) 97.5 F (36.4 C)  SpO2: 96% 97%    Intake/Output Summary (Last 24 hours) at 08/31/2023 1113 Last data filed at 08/31/2023 0830 Gross per 24 hour  Intake 908.19 ml  Output 1850 ml  Net -941.81 ml   Filed Weights   08/29/23 0902  Weight: 80.1 kg   Body mass index is 27.64 kg/m.   Physical Exam: GENERAL: Patient is alert awake and oriented. Not in obvious distress. HENT: No scleral pallor or icterus. Pupils equally reactive to light. Oral mucosa is moist NECK: is supple, no gross swelling noted. CHEST: Clear to auscultation. No crackles or wheezes.  CVS: S1 and S2 heard, no murmur. Regular rate and rhythm.  ABDOMEN: Soft, non-tender, bowel sounds are present. EXTREMITIES: No edema.  Left groin with wound VAC.  PICC line in right upper extremity CNS: Cranial nerves are intact. No focal motor deficits. SKIN: warm and dry, left groin with wound VAC.  Data Review: I have personally reviewed the following laboratory data and studies,  CBC: Recent  Labs  Lab 08/25/23 1633 08/26/23 0450 08/29/23 0432 08/30/23 0349 08/31/23 0409  WBC 13.5* 11.5* 7.4 10.6* 7.6  HGB 12.4* 10.7* 10.1* 9.9* 9.7*  HCT 39.2 33.7* 31.9* 31.8* 31.7*  MCV 90.1 88.9 87.9 89.3 90.3  PLT 341 330 396 395 410*   Basic Metabolic Panel: Recent Labs  Lab 08/27/23 0953 08/28/23 0354 08/29/23 0432 08/30/23 0349 08/31/23 0409  NA 134* 137 138 136 138  K 4.9 4.3 4.0 4.6 4.0  CL 105 106 111 110 108  CO2 18* 21* 21* 22 23  GLUCOSE 265* 168* 133* 319* 208*  BUN 33* 33* 25* 26* 21  CREATININE 1.62* 1.75* 1.63* 1.36* 1.44*  CALCIUM  8.9 8.6* 8.5* 8.3* 8.4*  MG  --   --   --   --  1.9   Liver Function Tests: Recent Labs  Lab 08/25/23 1633  AST 17  ALT 10  ALKPHOS 60  BILITOT 0.8  PROT 7.3  ALBUMIN  3.0*   No results for input(s): "LIPASE", "AMYLASE" in the last 168 hours. No results for input(s): "AMMONIA" in the last 168 hours. Cardiac Enzymes: No  results for input(s): "CKTOTAL", "CKMB", "CKMBINDEX", "TROPONINI" in the last 168 hours. BNP (last 3 results) No results for input(s): "BNP" in the last 8760 hours.  ProBNP (last 3 results) Recent Labs    09/06/22 1216  PROBNP 3,744*    CBG: Recent Labs  Lab 08/30/23 1148 08/30/23 1547 08/30/23 2103 08/31/23 0555 08/31/23 1110  GLUCAP 114* 200* 177* 165* 110*   Recent Results (from the past 240 hours)  Surgical pcr screen     Status: Abnormal   Collection Time: 08/26/23  5:10 AM   Specimen: Nasal Mucosa; Nasal Swab  Result Value Ref Range Status   MRSA, PCR NEGATIVE NEGATIVE Final   Staphylococcus aureus POSITIVE (A) NEGATIVE Final    Comment: (NOTE) The Xpert SA Assay (FDA approved for NASAL specimens in patients 4 years of age and older), is one component of a comprehensive surveillance program. It is not intended to diagnose infection nor to guide or monitor treatment. Performed at [facility], [address], [place], [place] [address]   Aerobic/Anaerobic Culture w Gram Stain (surgical/deep wound)     Status: None (Preliminary result)   Collection Time: 08/26/23  9:20 AM   Specimen: Wound  Result Value Ref Range Status   Specimen Description WOUND  Final   Special Requests LEFT GROIN SWAB  Final   Gram Stain   Final    FEW WBC PRESENT, PREDOMINANTLY PMN FEW GRAM POSITIVE COCCI Performed at [facility], [address], [place], [place] [address]    Culture   Final    FEW STAPHYLOCOCCUS AUREUS NO ANAEROBES ISOLATED; CULTURE IN PROGRESS FOR 5 DAYS    Report Status PENDING  Incomplete   Organism ID, Bacteria STAPHYLOCOCCUS AUREUS  Final      Susceptibility   Staphylococcus aureus - MIC*    CIPROFLOXACIN <=0.5 SENSITIVE Sensitive     ERYTHROMYCIN <=0.25 SENSITIVE Sensitive     GENTAMICIN  <=0.5 SENSITIVE Sensitive     OXACILLIN 0.5 SENSITIVE Sensitive     TETRACYCLINE <=1 SENSITIVE Sensitive     VANCOMYCIN 1 SENSITIVE Sensitive      TRIMETH/SULFA <=10 SENSITIVE Sensitive     CLINDAMYCIN <=0.25 SENSITIVE Sensitive     RIFAMPIN <=0.5 SENSITIVE Sensitive     Inducible Clindamycin NEGATIVE Sensitive     LINEZOLID 2 SENSITIVE Sensitive     * FEW STAPHYLOCOCCUS  AUREUS     Studies: ECHOCARDIOGRAM COMPLETE BUBBLE STUDY Result Date: 08/30/2023    ECHOCARDIOGRAM REPORT   Patient Name:   [name] Date of Exam: 08/30/2023 Medical Rec #:  [id number]     Height:       67.0 in Accession #:    [id number]    Weight:       176.5 lb Date of Birth:  1946/03/28     BSA:          1.918 m Patient Age:    77 years      BP:           134/64 mmHg Patient Gender: M             HR:           79 bpm. Exam Location:  Inpatient Procedure: 2D Echo, Cardiac Doppler, Color Doppler and Intracardiac            Opacification Agent (Both Spectral and Color Flow Doppler were            utilized during procedure). Indications:    Murmur  History:        Patient has prior history of Echocardiogram examinations, most                 recent 05/23/2023. Cardiomyopathy and CHF, Previous Myocardial                 Infarction and CAD, COPD, Arrythmias:Atrial Fibrillation and                 PVC, Signs/Symptoms:Fatigue; Risk Factors:Diabetes, Dyslipidemia                 and Former Smoker. PVD, CKD stage 3.  Sonographer:    Aldon Ambrosia Referring Phys: [id number] CYNTHIA SNIDER  Sonographer Comments: Technically difficult study due to poor echo windows. IMPRESSIONS  1. Poor image quality for valve evaluation.  2. Left ventricular ejection fraction, by estimation, is 35 to 40%. The left ventricle has moderately decreased function. The left ventricle demonstrates regional wall motion abnormalities (see scoring diagram/findings for description). There is mild left ventricular hypertrophy. Left ventricular diastolic parameters are indeterminate.  3. Right ventricular systolic function is normal. The right ventricular size is normal. Tricuspid regurgitation signal is inadequate for assessing  PA pressure.  4. The mitral valve is degenerative. Trivial mitral valve regurgitation. No evidence of mitral stenosis.  5. The aortic valve is abnormal. There is moderate calcification of the aortic valve. Aortic valve regurgitation is not visualized. Aortic valve sclerosis/calcification is present, without any evidence of aortic stenosis.  6. The inferior vena cava is normal in size with greater than 50% respiratory variability, suggesting right atrial pressure of 3 mmHg. FINDINGS  Left Ventricle: Left ventricular ejection fraction, by estimation, is 35 to 40%. The left ventricle has moderately decreased function. The left ventricle demonstrates regional wall motion abnormalities. The left ventricular internal cavity size was normal in size. There is mild left ventricular hypertrophy. Left ventricular diastolic parameters are indeterminate.  LV Wall Scoring: The inferior septum is akinetic. The anterior septum and entire inferior wall are hypokinetic. Right Ventricle: The right ventricular size is normal. No increase in right ventricular wall thickness. Right ventricular systolic function is normal. Tricuspid regurgitation signal is inadequate for assessing PA pressure. Left Atrium: Left atrial size was normal in size. Right Atrium: Right atrial size was normal in size. Pericardium: There is no evidence of pericardial effusion. Presence of epicardial fat  layer. Mitral Valve: The mitral valve is degenerative in appearance. Trivial mitral valve regurgitation. No evidence of mitral valve stenosis. MV peak gradient, 2.7 mmHg. The mean mitral valve gradient is 1.0 mmHg. Tricuspid Valve: The tricuspid valve is not well visualized. Tricuspid valve regurgitation is trivial. Aortic Valve: The aortic valve is abnormal. There is moderate calcification of the aortic valve. Aortic valve regurgitation is not visualized. Aortic valve sclerosis/calcification is present, without any evidence of aortic stenosis. Aortic valve mean  gradient measures 6.0 mmHg. Aortic valve peak gradient measures 10.6 mmHg. Aortic valve area, by VTI measures 1.28 cm. Pulmonic Valve: The pulmonic valve was not well visualized. Pulmonic valve regurgitation is not visualized. Aorta: The aortic root is normal in size and structure. Venous: The inferior vena cava is normal in size with greater than 50% respiratory variability, suggesting right atrial pressure of 3 mmHg. IAS/Shunts: The interatrial septum was not well visualized.  LEFT VENTRICLE PLAX 2D LVIDd:         5.30 cm      Diastology LVIDs:         4.00 cm      LV e' medial:    8.27 cm/s LV PW:         1.00 cm      LV E/e' medial:  11.7 LV IVS:        1.00 cm      LV e' lateral:   10.40 cm/s LVOT diam:     1.90 cm      LV E/e' lateral: 9.3 LV SV:         41 LV SV Index:   21 LVOT Area:     2.84 cm  LV Volumes (MOD) LV vol d, MOD A2C: 259.0 ml LV vol d, MOD A4C: 222.0 ml LV vol s, MOD A2C: 128.0 ml LV vol s, MOD A4C: 123.0 ml LV SV MOD A2C:     131.0 ml LV SV MOD A4C:     222.0 ml LV SV MOD BP:      114.9 ml RIGHT VENTRICLE RV Basal diam:  3.70 cm RV S prime:     9.57 cm/s TAPSE (M-mode): 2.1 cm LEFT ATRIUM             Index        RIGHT ATRIUM           Index LA diam:        3.40 cm 1.77 cm/m   RA Area:     15.60 cm LA Vol (A2C):   55.6 ml 28.99 ml/m  RA Volume:   38.30 ml  19.97 ml/m LA Vol (A4C):   59.9 ml 31.24 ml/m LA Biplane Vol: 58.9 ml 30.72 ml/m  AORTIC VALVE                     PULMONIC VALVE AV Area (Vmax):    1.37 cm      PV Vmax:       1.27 m/s AV Area (Vmean):   1.25 cm      PV Peak grad:  6.5 mmHg AV Area (VTI):     1.28 cm AV Vmax:           163.00 cm/s AV Vmean:          109.000 cm/s AV VTI:            0.319 m AV Peak Grad:      10.6 mmHg AV Mean Grad:  6.0 mmHg LVOT Vmax:         79.00 cm/s LVOT Vmean:        48.200 cm/s LVOT VTI:          0.144 m LVOT/AV VTI ratio: 0.45  AORTA Ao Root diam: 3.00 cm Ao Asc diam:  3.00 cm MITRAL VALVE MV Area (PHT): 3.43 cm    SHUNTS MV Area  VTI:   2.21 cm    Systemic VTI:  0.14 m MV Peak grad:  2.7 mmHg    Systemic Diam: 1.90 cm MV Mean grad:  1.0 mmHg MV Vmax:       0.81 m/s MV Vmean:      52.4 cm/s MV Decel Time: 221 msec MV E velocity: 96.90 cm/s MV A velocity: 88.00 cm/s MV E/A ratio:  1.10 Grady Lawman MD Electronically signed by Grady Lawman MD Signature Date/Time: 08/30/2023/5:03:43 PM    Final    US  EKG SITE RITE Result Date: 08/30/2023 If Site Rite image not attached, placement could not be confirmed due to current cardiac rhythm.     Thaniel Coluccio, MD  [facility] 08/31/2023  If 7PM-7AM, please contact night-coverage

## 2023-08-31 NOTE — Progress Notes (Signed)
 PHARMACY CONSULT NOTE FOR:  OUTPATIENT  PARENTERAL ANTIBIOTIC THERAPY (OPAT)  Indication: MSSA vascular graft infection Regimen: cefazolin 2g IV q8h End date: 10/10/2023  IV antibiotic discharge orders are pended. To discharging provider:  please sign these orders via discharge navigator,  Select New Orders & click on the button choice - Manage This Unsigned Work.     Thank you for allowing pharmacy to be a part of this patient's care.  Sherre Docker 08/31/2023, 1:05 PM

## 2023-08-31 NOTE — Progress Notes (Addendum)
  Progress Note    08/31/2023 8:14 AM 2 Days Post-Op  Subjective:  no complaints.  Vac changed last night per patient   Vitals:   08/31/23 0057 08/31/23 0324  BP: (!) 122/91 (!) 157/66  Pulse: 84 79  Resp: 19 15  Temp:  97.8 F (36.6 C)  SpO2: 96% 96%   Physical Exam: Lungs:  non labored Incisions:  L groin vac to suction with black sponge Extremities:  bilateral feet are warm Neurologic: A&O x3   CBC    Component Value Date/Time   WBC 7.6 08/31/2023 0409   RBC 3.51 (L) 08/31/2023 0409   HGB 9.7 (L) 08/31/2023 0409   HGB 14.1 03/13/2023 1003   HCT 31.7 (L) 08/31/2023 0409   HCT 44.6 03/13/2023 1003   PLT 410 (H) 08/31/2023 0409   PLT 208 03/13/2023 1003   MCV 90.3 08/31/2023 0409   MCV 86 03/13/2023 1003   MCH 27.6 08/31/2023 0409   MCHC 30.6 08/31/2023 0409   RDW 15.5 08/31/2023 0409   RDW 13.9 03/13/2023 1003   LYMPHSABS 1.7 08/21/2023 1610   LYMPHSABS 1.5 03/13/2023 1003   MONOABS 1.1 (H) 08/21/2023 1610   EOSABS 0.0 08/21/2023 1610   EOSABS 0.1 03/13/2023 1003   BASOSABS 0.0 08/21/2023 1610   BASOSABS 0.0 03/13/2023 1003    BMET    Component Value Date/Time   NA 138 08/31/2023 0409   NA 140 03/13/2023 1003   K 4.0 08/31/2023 0409   CL 108 08/31/2023 0409   CO2 23 08/31/2023 0409   GLUCOSE 208 (H) 08/31/2023 0409   BUN 21 08/31/2023 0409   BUN 13 03/13/2023 1003   CREATININE 1.44 (H) 08/31/2023 0409   CREATININE 1.20 09/10/2013 0513   CALCIUM  8.4 (L) 08/31/2023 0409   GFRNONAA 50 (L) 08/31/2023 0409   GFRAA 82 02/09/2018 0721    INR    Component Value Date/Time   INR 2.2 (H) 08/25/2023 1633     Intake/Output Summary (Last 24 hours) at 08/31/2023 0814 Last data filed at 08/31/2023 8119 Gross per 24 hour  Intake 240 ml  Output 2175 ml  Net -1935 ml     Assessment/Plan:  78 y.o. male is s/p washout L groin 2 Days Post-Op   L groin vac changed overnight Abx per ID recommendations; he will get a picc line today TOC consulted for  home vac and [facility] RN Home tomorrow after the above    Cordie Deters, PA-C [facility] [phone] 08/31/2023 8:14 AM  I have independently interviewed and examined patient and agree with PA assessment and plan above. Will need picc line per ID and set up vac for home.   Joselyn Edling C. Vikki Graves, MD [facility] [place] Office: [phone] Pager: [phone]

## 2023-08-31 NOTE — Progress Notes (Signed)
 Peripherally Inserted Central Catheter Placement  The IV Nurse has discussed with the patient and/or persons authorized to consent for the patient, the purpose of this procedure and the potential benefits and risks involved with this procedure.  The benefits include less needle sticks, lab draws from the catheter, and the patient may be discharged home with the catheter. Risks include, but not limited to, infection, bleeding, blood clot (thrombus formation), and puncture of an artery; nerve damage and irregular heartbeat and possibility to perform a PICC exchange if needed/ordered by physician.  Alternatives to this procedure were also discussed.  Bard Power PICC patient education guide, fact sheet on infection prevention and patient information card has been provided to patient /or left at bedside.    PICC Placement Documentation  PICC Single Lumen 08/31/23 Right Basilic 41 cm 0 cm (Active)  Indication for Insertion or Continuance of Line Home intravenous therapies (PICC only) 08/31/23 1012  Exposed Catheter (cm) 0 cm 08/31/23 1012  Site Assessment Clean, Dry, Intact 08/31/23 1012  Line Status Saline locked;Blood return noted 08/31/23 1012  Dressing Type Transparent;Securing device 08/31/23 1012  Dressing Status Antimicrobial disc/dressing in place;Clean, Dry, Intact 08/31/23 1012  Line Care Connections checked and tightened 08/31/23 1012  Line Adjustment (NICU/IV Team Only) No 08/31/23 1012  Dressing Intervention New dressing 08/31/23 1012  Dressing Change Due 09/07/23 08/31/23 1012       Lulu Sales Chenice 08/31/2023, 10:14 AM

## 2023-08-31 NOTE — TOC Progression Note (Signed)
 Transition of Care (TOC) - Progression Note  Sherin Dingwall RN, BSN Transitions of Care Unit 4E- RN Case Manager See Treatment Team for direct phone #   Patient Details  Name: [name] MRN: [id number] Date of Birth: May 26, 1945  Transition of Care [facility] CM/SW Contact  Jearld Min Myrtle Atta, RN Phone Number: 08/31/2023, 1:47 PM  Clinical Narrative:    CM has received notice from [employer] that home wound VAC has been approved. Liaison to send POD for [facility] delivery- CM will deliver to the bedside.   Per progression rounds - pt to have PICC line placed. ID OPAT has been placed for LT IV ABX needs- 6wks- end date 6/24.  CM has updated home infusion liaison- Pam for coordination of home infusion needs as well as updated [facility] liaison.  Per Pam- she will try to schedule bedside education with pt and SO later today to prep for potential d/c tomorrow.  Per [facility]- they will schedule pt for start of care RN visit on Saturday.   TOC will follow up in am for home [facility] delivery and coordination of transition needs.   Call made to daughter- Crystal per request to review discharge plan.    Expected Discharge Plan: Home w Home Health Services Barriers to Discharge: Continued Medical Work up  Expected Discharge Plan and Services   Discharge Planning Services: CM Consult Post Acute Care Choice: Durable Medical Equipment, Home Health Living arrangements for the past 2 months: Single Family Home                 DME Arranged: Vac DME Agency: [employer] Date DME Agency Contacted: 08/29/23 Time DME Agency Contacted: 1345 Representative spoke with at DME Agency: Sherrlyn Dolores HH Arranged: RN HH Agency: [facility] Date [facility] Agency Contacted: 08/30/23 Time HH Agency Contacted: 1335 Representative spoke with at [facility] Agency: Loetta Ringer   Social Determinants of Health (SDOH) Interventions SDOH Screenings   Food Insecurity: No Food Insecurity (08/25/2023)  Housing: Low Risk  (08/25/2023)  Transportation  Needs: No Transportation Needs (08/25/2023)  Utilities: Not At Risk (08/25/2023)  Depression (PHQ2-9): Low Risk  (08/16/2023)  Social Connections: Socially Integrated (08/25/2023)  Tobacco Use: Medium Risk (08/29/2023)    Readmission Risk Interventions    07/03/2023   10:09 AM  Readmission Risk Prevention Plan  Transportation Screening Complete  PCP or Specialist Appt within 5-7 Days Complete  Home Care Screening Complete  Medication Review (RN CM) Complete

## 2023-09-01 ENCOUNTER — Other Ambulatory Visit (HOSPITAL_COMMUNITY): Payer: Self-pay

## 2023-09-01 DIAGNOSIS — T827XXA Infection and inflammatory reaction due to other cardiac and vascular devices, implants and grafts, initial encounter: Secondary | ICD-10-CM | POA: Diagnosis not present

## 2023-09-01 DIAGNOSIS — Z794 Long term (current) use of insulin: Secondary | ICD-10-CM | POA: Diagnosis not present

## 2023-09-01 DIAGNOSIS — T827XXD Infection and inflammatory reaction due to other cardiac and vascular devices, implants and grafts, subsequent encounter: Secondary | ICD-10-CM | POA: Diagnosis not present

## 2023-09-01 DIAGNOSIS — E1122 Type 2 diabetes mellitus with diabetic chronic kidney disease: Secondary | ICD-10-CM | POA: Diagnosis not present

## 2023-09-01 DIAGNOSIS — N1831 Chronic kidney disease, stage 3a: Secondary | ICD-10-CM | POA: Diagnosis not present

## 2023-09-01 LAB — BASIC METABOLIC PANEL WITH GFR
Anion gap: 6 (ref 5–15)
BUN: 22 mg/dL (ref 8–23)
CO2: 23 mmol/L (ref 22–32)
Calcium: 8.4 mg/dL — ABNORMAL LOW (ref 8.9–10.3)
Chloride: 109 mmol/L (ref 98–111)
Creatinine, Ser: 1.22 mg/dL (ref 0.61–1.24)
GFR, Estimated: >60 mL/min (ref >60)
Glucose, Bld: 137 mg/dL — ABNORMAL HIGH (ref 70–99)
Potassium: 3.8 mmol/L (ref 3.5–5.1)
Sodium: 138 mmol/L (ref 135–145)

## 2023-09-01 LAB — GLUCOSE, CAPILLARY
Glucose-Capillary: 135 mg/dL — ABNORMAL HIGH (ref 70–99)
Glucose-Capillary: 148 mg/dL — ABNORMAL HIGH (ref 70–99)

## 2023-09-01 MED ORDER — OXYCODONE-ACETAMINOPHEN 5-325 MG PO TABS
1.0000 | ORAL_TABLET | Freq: Four times a day (QID) | ORAL | 0 refills | Status: DC | PRN
  Filled 2023-09-01: qty 20, 5d supply, fill #0

## 2023-09-01 MED ORDER — HEPARIN SOD (PORK) LOCK FLUSH 100 UNIT/ML IV SOLN
250.0000 [IU] | INTRAVENOUS | Status: AC | PRN
  Administered 2023-09-01: 250 [IU]

## 2023-09-01 MED ORDER — INSULIN GLARGINE 100 UNITS/ML SOLOSTAR PEN: PEN_INJECTOR | SUBCUTANEOUS | Status: DC

## 2023-09-01 MED ORDER — INSULIN ASPART 100 UNIT/ML FLEXPEN
12.0000 [IU] | PEN_INJECTOR | Freq: Three times a day (TID) | SUBCUTANEOUS | 11 refills | Status: DC
  Filled 2023-09-01: qty 12, 30d supply, fill #0

## 2023-09-01 MED ORDER — CEFAZOLIN IV (FOR PTA / DISCHARGE USE ONLY): 2.0000 g | Freq: Three times a day (TID) | INTRAVENOUS | 0 refills | Status: AC

## 2023-09-01 NOTE — TOC Transition Note (Signed)
 Transition of Care (TOC) - Discharge Note Sherin Dingwall RN, BSN Transitions of Care Unit 4E- RN Case Manager See Treatment Team for direct phone #   Patient Details  Name: GEOGGREY GRESH MRN: [id number] Date of Birth: [birth year]  Transition of Care [facility]) CM/SW Contact:  Rox Cope, RN Phone Number: 09/01/2023, 11:35 AM   Clinical Narrative:    Pt stable for transition home today,  Home infusion liaison has completed education with pt and SO at bedside- [facility] from [facility] to do home visit tomorrow to support home IV abx needs.  [facility] home infusion will delivery home abx later today.  Pt will need to get 2pm dose here prior to discharge. Then will be able to transition home.   CM has delivered home wound VAC to the bedside- and signed POD has been faxed back to [facility] liaison. Copy of POD placed in chart, pt provided original along with home vac and supplies.  Bedside RN to change over VAC to home VAC on discharge.  Daughter to transport home.   [facility] liaison aware of discharge and has scheduled start of care visit for tomorrow.   No further TOC needs noted.    Final next level of care: Home w Home Health Services Barriers to Discharge: Barriers Resolved   Patient Goals and CMS Choice Patient states their goals for this hospitalization and ongoing recovery are:: return home CMS Medicare.gov Compare Post Acute Care list provided to:: Patient Choice offered to / list presented to : Patient      Discharge Placement               Home w/ [facility]        Discharge Plan and Services Additional resources added to the After Visit Summary for     Discharge Planning Services: CM Consult Post Acute Care Choice: Durable Medical Equipment, Home Health          DME Arranged: Vac DME Agency: [facility] Date DME Agency Contacted: 08/29/23 Time DME Agency Contacted: 1345 Representative spoke with at DME Agency: Sherrlyn Dolores HH Arranged: RN HH Agency: [facility] Date [facility] Agency Contacted: 08/30/23 Time HH Agency Contacted: 1335 Representative spoke with at [facility] Agency: Loetta Ringer  Social Drivers of Health (SDOH) Interventions SDOH Screenings   Food Insecurity: No Food Insecurity (08/25/2023)  Housing: Low Risk  (08/25/2023)  Transportation Needs: No Transportation Needs (08/25/2023)  Utilities: Not At Risk (08/25/2023)  Depression (PHQ2-9): Low Risk  (08/16/2023)  Social Connections: Socially Integrated (08/25/2023)  Tobacco Use: Medium Risk (08/29/2023)     Readmission Risk Interventions    09/01/2023   11:34 AM 07/03/2023   10:09 AM  Readmission Risk Prevention Plan  Transportation Screening Complete Complete  PCP or Specialist Appt within 5-7 Days  Complete  PCP or Specialist Appt within 3-5 Days Complete   Home Care Screening  Complete  Medication Review (RN CM)  Complete  HRI or Home Care Consult Complete   Social Work Consult for Recovery Care Planning/Counseling Complete   Palliative Care Screening Not Applicable   Medication Review Oceanographer) Complete

## 2023-09-01 NOTE — Progress Notes (Addendum)
  Progress Note    09/01/2023 8:04 AM 3 Days Post-Op  Subjective:  no major complaints   Vitals:   08/31/23 2315 09/01/23 0340  BP: (!) 104/55 131/63  Pulse:    Resp: 14 12  Temp: 98 F (36.7 C) 98.1 F (36.7 C)  SpO2:  95%   Physical Exam: Cardiac:  regular Lungs: non labored Incisions:  left groin incision with VAC to suction, VAC removed, healthy appearing granulation tissue in wound bed, no drainage. staples in place at distal aspect of incision. Black sponge and VAC reapplied with good seal  Extremities:  BLE well perfused and warm with palpable left DP and right PT pulses Abdomen:  protuberant Neurologic: alert and oriented  CBC    Component Value Date/Time   WBC 7.6 08/31/2023 0409   RBC 3.51 (L) 08/31/2023 0409   HGB 9.7 (L) 08/31/2023 0409   HGB 14.1 03/13/2023 1003   HCT 31.7 (L) 08/31/2023 0409   HCT 44.6 03/13/2023 1003   PLT 410 (H) 08/31/2023 0409   PLT 208 03/13/2023 1003   MCV 90.3 08/31/2023 0409   MCV 86 03/13/2023 1003   MCH 27.6 08/31/2023 0409   MCHC 30.6 08/31/2023 0409   RDW 15.5 08/31/2023 0409   RDW 13.9 03/13/2023 1003   LYMPHSABS 1.7 08/21/2023 1610   LYMPHSABS 1.5 03/13/2023 1003   MONOABS 1.1 (H) 08/21/2023 1610   EOSABS 0.0 08/21/2023 1610   EOSABS 0.1 03/13/2023 1003   BASOSABS 0.0 08/21/2023 1610   BASOSABS 0.0 03/13/2023 1003    BMET    Component Value Date/Time   NA 138 09/01/2023 0603   NA 140 03/13/2023 1003   K 3.8 09/01/2023 0603   CL 109 09/01/2023 0603   CO2 23 09/01/2023 0603   GLUCOSE 137 (H) 09/01/2023 0603   BUN 22 09/01/2023 0603   BUN 13 03/13/2023 1003   CREATININE 1.22 09/01/2023 0603   CREATININE 1.20 09/10/2013 0513   CALCIUM  8.4 (L) 09/01/2023 0603   GFRNONAA >60 09/01/2023 0603   GFRAA 82 02/09/2018 0721    INR    Component Value Date/Time   INR 2.2 (H) 08/25/2023 1633     Intake/Output Summary (Last 24 hours) at 09/01/2023 0804 Last data filed at 09/01/2023 0400 Gross per 24 hour   Intake 710 ml  Output 875 ml  Net -165 ml     Assessment/Plan:  78 y.o. male is s/p washout L groin  3 Days Post-Op   Left groin vac changed this morning. Tissue healthy appearing. VAC reapplied with good seal BLE well perfused and warm with palpable right PT, left DP HH RN to do 3x/week VAC changes OPAT orders arranged and has PICC line for 6 weeks ABx Appreciate ID and TRH assistance Outpatient follow up with ID in 5 weeks Plan is for d/c home today Follow up in our office in 2 weeks on 5/29 for wound check  Deneen Finical, PA-C [facility] [phone] 09/01/2023 8:04 AM  VASCULAR STAFF ADDENDUM: I have independently interviewed and examined the patient. I agree with the above.    Philipp Brawn MD [facility] Phone Number: [phone] 09/01/2023 12:15 PM

## 2023-09-01 NOTE — Progress Notes (Signed)
 DISCHARGE NOTE HOME [name] to be discharged Home per MD order. Discussed prescriptions and follow up appointments with the patient. Prescriptions given to patient; medication list explained in detail. Patient verbalized understanding.  Caregiver picked up TOC meds.  Educated on Wound VAC and reviewed home antibiotic details  Skin clean, dry and intact without evidence of skin break down, no evidence of skin tears noted. IV catheter discontinued intact. Site without signs and symptoms of complications. Dressing and pressure applied. Pt denies pain at the site currently. No complaints noted.  See LDA  discharging with picc line and wound vac to left hip.   An After Visit Summary (AVS) was printed and given to the patient. Patient escorted via wheelchair, and discharged home via private auto.  Tonda Francisco, RN

## 2023-09-01 NOTE — Progress Notes (Signed)
 Patient changed to home wound vac and educated how to change cannister. PICC line flushed and hep locked by PICC/IV team.

## 2023-09-01 NOTE — Progress Notes (Signed)
 [facility] Liaison Note  09/01/2023  [name] 1945-12-15 [id number]  Location: RN Hospital Liaison screened the patient remotely at [facility].  Insurance: Blue Cross Blue Shield Medicare Advantage   [name] is a 78 y.o. male who is a Primary Care Patient of [name], Dennis Caulk, MD [facility]). The patient was screened for readmission hospitalization with noted high risk score for unplanned readmission risk with 3 IP/1 ED in 6 months.  The patient was assessed for potential Care Management service needs for post hospital transition for care coordination. Review of patient's electronic medical record reveals patient was admitted for Infection of vascular bypass graft. This provider is not partnering with [facility] for care management services at this time and no pharmacy needs presented at this time. Pt will discharge home with [facility] for HHealth services.  [facility] Care Management/Population Health does not replace or interfere with any arrangements made by the Inpatient Transition of Care team.   For questions contact:   Lilla Reichert, RN, BSN Hospital Liaison [place]   [facility], Population Health Office Hours MTWF  8:00 am-6:00 pm Direct Dial: [phone] mobile [email or web address]

## 2023-09-01 NOTE — Progress Notes (Signed)
 PROGRESS NOTE  [name] ZOX:[id number] DOB: 23-Jan-1946 DOA: 08/25/2023 PCP: Wayne Haines, MD   LOS: 7 days   Brief narrative:  [name] is a 78 y.o. male with past medical history of PAD s/p fem-fem bypass, type 2 diabetes, hypertension, coronary artery disease status post PTCA, chronic heart failure with reduced ejection fraction, atrial fibrillation, hyperlipidemia, GERD, PTSD and CKD stage IIIa presented to the hospital initially with left groin pain.  Of note patient has history of  s/p right SFA and AT mechanical thrombectomy and right SFA drug-coated balloon angioplasty on 07/10/2023.  This tim, presented with left groin severe pain. CTA revealed stranding around left groin suggestive of inflammation versus infection. Patient was subsequently discharged from ED. Pain did not improve after discharge and patient seen by vascular surgery on 5/9. Ultrasound showed fluid surrounding proximal portion of left to right fem-fem bypass (not evident on prior CTA), concerning for infection at left groin cath site and was admitted to vascular surgery service for further evaluation and treatment.  Medical team was consulted for management of hyperglycemia.       Assessment/Plan: Principal Problem:   Infection of vascular bypass graft (HCC) Active Problems:   AKI (acute kidney injury) (HCC)   DM2 (diabetes mellitus, type 2) (HCC)   Essential hypertension   CKD stage 3b, GFR 30-44 ml/min (HCC)   Hypercholesterolemia   Atherosclerosis of native artery of extremity with intermittent claudication (HCC)   Coronary artery disease   Paroxysmal atrial fibrillation (HCC)   PVD (peripheral vascular disease) (HCC)   PAD (peripheral artery disease) (HCC)   Chronic combined systolic (congestive) and diastolic (congestive) heart failure (HCC)   GERD (gastroesophageal reflux disease)   BPH (benign prostatic hyperplasia)   Staphylococcus aureus infection  uncontrolled type 2 diabetes mellitus with  hyperglycemia -Hemoglobin A1c 10.6 (06/30/2023).  Patient on Jardiance  10 mg daily and Lantus  35 units at home.  On  Semglee  40 units,, NovoLog  12 units 3 times daily with meals and sliding scale insulin  at this time.  Latest POC glucose of 145.  Improved.  Recommend Lantus  40 units at nighttime, NovoLog  FlexPen 10 units 3 times daily with meals on discharge.   Hypotension  Blood pressure is improved.  Will resume amlodipine  and Isordil.  Continue to hold Entresto  for now.    Patient on Norvasc , Entresto , Coreg , Imdur prior to admission.    AKI on CKD stage IIIa -Baseline creatinine approximately 1.2-1.35.  Creatinine today at 1.2.  .  Renal ultrasound negative for hydronephrosis but echogenic kidneys.  On IV Ancef.  Was on vancomycin which has been discontinued.    Infected femorofemoral bypass graft/PAD status post left groin washout, primary repair of graftotomy, application of Kerecis, partial closure and VAC placement 08/26/2023 per Dr. Vikki Graves - Status post right SFA and AT mechanical thrombectomy and right SFA drug-coated balloon angioplasty on 07/10/2023.  Patient was admitted to vascular surgery and underwent left groin washout initially followed by repeat intervention on 08/29/2023.  Status post wound VAC placement. Left groin culture/Gram stain with MSSA.  On IV Ancef at this time.  Continue Crestor  Zithromax.  ID on board.  Status post PICC line placement.  CAD status post PTCA/DES to proximal LAD -Patient on aspirin , Coreg , Imdur, Entresto , Norvasc  prior to admission.    Chronic HFrEF -Compensated. Review of latest 2D echo EF (05/23/2023 ) of 45 to 50%, regional wall motion abnormalities, mild LVH.  Continue nitrates, continue Coreg  aspirin .   Paroxysmal A-fib On Coreg .  Xarelto  has been resumed.    Hyperlipidemia - Continue Crestor , Zetia .   GERD - Continue PPI.    BPH - Continue Flomax .  DVT prophylaxis: Place TED hose Start: 08/25/23 1614 Rivaroxaban  (XARELTO ) tablet 15 mg    Disposition: As per primary team.  Medically stable for disposition.  Status is: Inpatient Remains inpatient appropriate because: Status post vascular intervention, IV antibiotics, hyperglycemia    Code Status:     Code Status: Full Code  Family Communication: None at bedside   Procedures: ABI with TBI 08/25/2023  Left groin intervention 5/10 and 08/29/2023 Right upper extremity PICC line  Anti-infectives:  Cefazolin IV  Anti-infectives (From admission, onward)    Start     Dose/Rate Route Frequency Ordered Stop   09/01/23 0000  ceFAZolin (ANCEF) IVPB        2 g Intravenous Every 8 hours 09/01/23 0817 10/11/23 2359   08/29/23 1600  ceFAZolin (ANCEF) IVPB 2g/100 mL premix        2 g 200 mL/hr over 30 Minutes Intravenous Every 8 hours 08/29/23 1311     08/26/23 1800  Vancomycin (VANCOCIN) 1,250 mg in sodium chloride  0.9 % 250 mL IVPB  Status:  Discontinued        1,250 mg 166.7 mL/hr over 90 Minutes Intravenous Every 24 hours 08/25/23 1656 08/26/23 1010   08/26/23 1800  vancomycin (VANCOCIN) IVPB 1000 mg/200 mL premix  Status:  Discontinued        1,000 mg 200 mL/hr over 60 Minutes Intravenous Every 24 hours 08/26/23 1010 08/29/23 1311   08/26/23 0916  vancomycin (VANCOCIN) 1,000 mg, gentamicin  (GARAMYCIN ) 80 mg in sodium chloride  irrigation 0.9 % 3,000 mL irrigation  Status:  Discontinued          As needed 08/26/23 0918 08/26/23 0920   08/25/23 1745  vancomycin (VANCOREADY) IVPB 1500 mg/300 mL        1,500 mg 150 mL/hr over 120 Minutes Intravenous  Once 08/25/23 1651 08/25/23 2055   08/25/23 1745  piperacillin -tazobactam (ZOSYN ) IVPB 3.375 g  Status:  Discontinued        3.375 g 12.5 mL/hr over 240 Minutes Intravenous Every 8 hours 08/25/23 1651 08/29/23 1311        Subjective: Today, patient was seen and examined at bedside.  Patient states t that he feels okay without any pain fever chills or rigor.  Objective: Vitals:   09/01/23 0340 09/01/23 0830  BP: 131/63  (!) 143/76  Pulse:  93  Resp: 12   Temp: 98.1 F (36.7 C) 97.8 F (36.6 C)  SpO2: 95% 96%    Intake/Output Summary (Last 24 hours) at 09/01/2023 0912 Last data filed at 09/01/2023 0400 Gross per 24 hour  Intake 710 ml  Output 600 ml  Net 110 ml   Filed Weights   08/29/23 0902  Weight: 80.1 kg   Body mass index is 27.64 kg/m.   Physical Exam: GENERAL: Patient is alert awake and oriented. Not in obvious distress. HENT: No scleral pallor or icterus. Pupils equally reactive to light. Oral mucosa is moist NECK: is supple, no gross swelling noted. CHEST: Clear to auscultation. No crackles or wheezes.  CVS: S1 and S2 heard, no murmur. Regular rate and rhythm.  ABDOMEN: Soft, non-tender, bowel sounds are present. EXTREMITIES: No edema.  Left groin with wound VAC.  PICC line in right upper extremity CNS: Cranial nerves are intact. No focal motor deficits. SKIN: warm and dry, left groin with wound VAC.  Data Review: I  have personally reviewed the following laboratory data and studies,  CBC: Recent Labs  Lab 08/25/23 1633 08/26/23 0450 08/29/23 0432 08/30/23 0349 08/31/23 0409  WBC 13.5* 11.5* 7.4 10.6* 7.6  HGB 12.4* 10.7* 10.1* 9.9* 9.7*  HCT 39.2 33.7* 31.9* 31.8* 31.7*  MCV 90.1 88.9 87.9 89.3 90.3  PLT 341 330 396 395 410*   Basic Metabolic Panel: Recent Labs  Lab 08/28/23 0354 08/29/23 0432 08/30/23 0349 08/31/23 0409 09/01/23 0603  NA 137 138 136 138 138  K 4.3 4.0 4.6 4.0 3.8  CL 106 111 110 108 109  CO2 21* 21* 22 23 23   GLUCOSE 168* 133* 319* 208* 137*  BUN 33* 25* 26* 21 22  CREATININE 1.75* 1.63* 1.36* 1.44* 1.22  CALCIUM  8.6* 8.5* 8.3* 8.4* 8.4*  MG  --   --   --  1.9  --    Liver Function Tests: Recent Labs  Lab 08/25/23 1633  AST 17  ALT 10  ALKPHOS 60  BILITOT 0.8  PROT 7.3  ALBUMIN  3.0*   No results for input(s): "LIPASE", "AMYLASE" in the last 168 hours. No results for input(s): "AMMONIA" in the last 168 hours. Cardiac Enzymes: No  results for input(s): "CKTOTAL", "CKMB", "CKMBINDEX", "TROPONINI" in the last 168 hours. BNP (last 3 results) No results for input(s): "BNP" in the last 8760 hours.  ProBNP (last 3 results) Recent Labs    09/06/22 1216  PROBNP 3,744*    CBG: Recent Labs  Lab 08/31/23 0555 08/31/23 1110 08/31/23 1612 08/31/23 2119 09/01/23 0617  GLUCAP 165* 110* 226* 179* 135*   Recent Results (from the past 240 hours)  Surgical pcr screen     Status: Abnormal   Collection Time: 08/26/23  5:10 AM   Specimen: Nasal Mucosa; Nasal Swab  Result Value Ref Range Status   MRSA, PCR NEGATIVE NEGATIVE Final   Staphylococcus aureus POSITIVE (A) NEGATIVE Final    Comment: (NOTE) The Xpert SA Assay (FDA approved for NASAL specimens in patients 44 years of age and older), is one component of a comprehensive surveillance program. It is not intended to diagnose infection nor to guide or monitor treatment. Performed at [facility], [address] [place], [place] [address]   Aerobic/Anaerobic Culture w Gram Stain (surgical/deep wound)     Status: None   Collection Time: 08/26/23  9:20 AM   Specimen: Wound  Result Value Ref Range Status   Specimen Description WOUND  Final   Special Requests LEFT GROIN SWAB  Final   Gram Stain   Final    FEW WBC PRESENT, PREDOMINANTLY PMN FEW GRAM POSITIVE COCCI    Culture   Final    FEW STAPHYLOCOCCUS AUREUS NO ANAEROBES ISOLATED Performed at [facility], [address], [place], [place] [address]    Report Status 08/31/2023 FINAL  Final   Organism ID, Bacteria STAPHYLOCOCCUS AUREUS  Final      Susceptibility   Staphylococcus aureus - MIC*    CIPROFLOXACIN <=0.5 SENSITIVE Sensitive     ERYTHROMYCIN <=0.25 SENSITIVE Sensitive     GENTAMICIN  <=0.5 SENSITIVE Sensitive     OXACILLIN 0.5 SENSITIVE Sensitive     TETRACYCLINE <=1 SENSITIVE Sensitive     VANCOMYCIN 1 SENSITIVE Sensitive     TRIMETH/SULFA <=10 SENSITIVE Sensitive     CLINDAMYCIN  <=0.25 SENSITIVE Sensitive     RIFAMPIN <=0.5 SENSITIVE Sensitive     Inducible Clindamycin NEGATIVE Sensitive     LINEZOLID 2 SENSITIVE Sensitive     *  FEW STAPHYLOCOCCUS AUREUS     Studies: ECHOCARDIOGRAM COMPLETE BUBBLE STUDY Result Date: 08/30/2023    ECHOCARDIOGRAM REPORT   Patient Name:   [name] Date of Exam: 08/30/2023 Medical Rec #:  [id number]     Height:       67.0 in Accession #:    [id number]    Weight:       176.5 lb Date of Birth:  Aug 01, 1945     BSA:          1.918 m Patient Age:    77 years      BP:           134/64 mmHg Patient Gender: M             HR:           79 bpm. Exam Location:  Inpatient Procedure: 2D Echo, Cardiac Doppler, Color Doppler and Intracardiac            Opacification Agent (Both Spectral and Color Flow Doppler were            utilized during procedure). Indications:    Murmur  History:        Patient has prior history of Echocardiogram examinations, most                 recent 05/23/2023. Cardiomyopathy and CHF, Previous Myocardial                 Infarction and CAD, COPD, Arrythmias:Atrial Fibrillation and                 PVC, Signs/Symptoms:Fatigue; Risk Factors:Diabetes, Dyslipidemia                 and Former Smoker. PVD, CKD stage 3.  Sonographer:    Aldon Ambrosia Referring Phys: [phone] CYNTHIA SNIDER  Sonographer Comments: Technically difficult study due to poor echo windows. IMPRESSIONS  1. Poor image quality for valve evaluation.  2. Left ventricular ejection fraction, by estimation, is 35 to 40%. The left ventricle has moderately decreased function. The left ventricle demonstrates regional wall motion abnormalities (see scoring diagram/findings for description). There is mild left ventricular hypertrophy. Left ventricular diastolic parameters are indeterminate.  3. Right ventricular systolic function is normal. The right ventricular size is normal. Tricuspid regurgitation signal is inadequate for assessing PA pressure.  4. The mitral valve is degenerative.  Trivial mitral valve regurgitation. No evidence of mitral stenosis.  5. The aortic valve is abnormal. There is moderate calcification of the aortic valve. Aortic valve regurgitation is not visualized. Aortic valve sclerosis/calcification is present, without any evidence of aortic stenosis.  6. The inferior vena cava is normal in size with greater than 50% respiratory variability, suggesting right atrial pressure of 3 mmHg. FINDINGS  Left Ventricle: Left ventricular ejection fraction, by estimation, is 35 to 40%. The left ventricle has moderately decreased function. The left ventricle demonstrates regional wall motion abnormalities. The left ventricular internal cavity size was normal in size. There is mild left ventricular hypertrophy. Left ventricular diastolic parameters are indeterminate.  LV Wall Scoring: The inferior septum is akinetic. The anterior septum and entire inferior wall are hypokinetic. Right Ventricle: The right ventricular size is normal. No increase in right ventricular wall thickness. Right ventricular systolic function is normal. Tricuspid regurgitation signal is inadequate for assessing PA pressure. Left Atrium: Left atrial size was normal in size. Right Atrium: Right atrial size was normal in size. Pericardium: There is no evidence of pericardial effusion. Presence of  epicardial fat layer. Mitral Valve: The mitral valve is degenerative in appearance. Trivial mitral valve regurgitation. No evidence of mitral valve stenosis. MV peak gradient, 2.7 mmHg. The mean mitral valve gradient is 1.0 mmHg. Tricuspid Valve: The tricuspid valve is not well visualized. Tricuspid valve regurgitation is trivial. Aortic Valve: The aortic valve is abnormal. There is moderate calcification of the aortic valve. Aortic valve regurgitation is not visualized. Aortic valve sclerosis/calcification is present, without any evidence of aortic stenosis. Aortic valve mean gradient measures 6.0 mmHg. Aortic valve peak  gradient measures 10.6 mmHg. Aortic valve area, by VTI measures 1.28 cm. Pulmonic Valve: The pulmonic valve was not well visualized. Pulmonic valve regurgitation is not visualized. Aorta: The aortic root is normal in size and structure. Venous: The inferior vena cava is normal in size with greater than 50% respiratory variability, suggesting right atrial pressure of 3 mmHg. IAS/Shunts: The interatrial septum was not well visualized.  LEFT VENTRICLE PLAX 2D LVIDd:         5.30 cm      Diastology LVIDs:         4.00 cm      LV e' medial:    8.27 cm/s LV PW:         1.00 cm      LV E/e' medial:  11.7 LV IVS:        1.00 cm      LV e' lateral:   10.40 cm/s LVOT diam:     1.90 cm      LV E/e' lateral: 9.3 LV SV:         41 LV SV Index:   21 LVOT Area:     2.84 cm  LV Volumes (MOD) LV vol d, MOD A2C: 259.0 ml LV vol d, MOD A4C: 222.0 ml LV vol s, MOD A2C: 128.0 ml LV vol s, MOD A4C: 123.0 ml LV SV MOD A2C:     131.0 ml LV SV MOD A4C:     222.0 ml LV SV MOD BP:      114.9 ml RIGHT VENTRICLE RV Basal diam:  3.70 cm RV S prime:     9.57 cm/s TAPSE (M-mode): 2.1 cm LEFT ATRIUM             Index        RIGHT ATRIUM           Index LA diam:        3.40 cm 1.77 cm/m   RA Area:     15.60 cm LA Vol (A2C):   55.6 ml 28.99 ml/m  RA Volume:   38.30 ml  19.97 ml/m LA Vol (A4C):   59.9 ml 31.24 ml/m LA Biplane Vol: 58.9 ml 30.72 ml/m  AORTIC VALVE                     PULMONIC VALVE AV Area (Vmax):    1.37 cm      PV Vmax:       1.27 m/s AV Area (Vmean):   1.25 cm      PV Peak grad:  6.5 mmHg AV Area (VTI):     1.28 cm AV Vmax:           163.00 cm/s AV Vmean:          109.000 cm/s AV VTI:            0.319 m AV Peak Grad:      10.6 mmHg AV Mean Grad:  6.0 mmHg LVOT Vmax:         79.00 cm/s LVOT Vmean:        48.200 cm/s LVOT VTI:          0.144 m LVOT/AV VTI ratio: 0.45  AORTA Ao Root diam: 3.00 cm Ao Asc diam:  3.00 cm MITRAL VALVE MV Area (PHT): 3.43 cm    SHUNTS MV Area VTI:   2.21 cm    Systemic VTI:  0.14 m MV Peak  grad:  2.7 mmHg    Systemic Diam: 1.90 cm MV Mean grad:  1.0 mmHg MV Vmax:       0.81 m/s MV Vmean:      52.4 cm/s MV Decel Time: 221 msec MV E velocity: 96.90 cm/s MV A velocity: 88.00 cm/s MV E/A ratio:  1.10 Grady Lawman MD Electronically signed by Grady Lawman MD Signature Date/Time: 08/30/2023/5:03:43 PM    Final    US  EKG SITE RITE Result Date: 08/30/2023 If Site Rite image not attached, placement could not be confirmed due to current cardiac rhythm.     Rosena Conradi, MD  [facility] 09/01/2023  If 7PM-7AM, please contact night-coverage

## 2023-09-02 DIAGNOSIS — Z792 Long term (current) use of antibiotics: Secondary | ICD-10-CM | POA: Diagnosis not present

## 2023-09-02 DIAGNOSIS — I2510 Atherosclerotic heart disease of native coronary artery without angina pectoris: Secondary | ICD-10-CM | POA: Diagnosis not present

## 2023-09-02 DIAGNOSIS — N1832 Chronic kidney disease, stage 3b: Secondary | ICD-10-CM | POA: Diagnosis not present

## 2023-09-02 DIAGNOSIS — R159 Full incontinence of feces: Secondary | ICD-10-CM | POA: Diagnosis not present

## 2023-09-02 DIAGNOSIS — M12811 Other specific arthropathies, not elsewhere classified, right shoulder: Secondary | ICD-10-CM | POA: Diagnosis not present

## 2023-09-02 DIAGNOSIS — E7800 Pure hypercholesterolemia, unspecified: Secondary | ICD-10-CM | POA: Diagnosis not present

## 2023-09-02 DIAGNOSIS — N401 Enlarged prostate with lower urinary tract symptoms: Secondary | ICD-10-CM | POA: Diagnosis not present

## 2023-09-02 DIAGNOSIS — I4729 Other ventricular tachycardia: Secondary | ICD-10-CM | POA: Diagnosis not present

## 2023-09-02 DIAGNOSIS — Z452 Encounter for adjustment and management of vascular access device: Secondary | ICD-10-CM | POA: Diagnosis not present

## 2023-09-02 DIAGNOSIS — I130 Hypertensive heart and chronic kidney disease with heart failure and stage 1 through stage 4 chronic kidney disease, or unspecified chronic kidney disease: Secondary | ICD-10-CM | POA: Diagnosis not present

## 2023-09-02 DIAGNOSIS — I5042 Chronic combined systolic (congestive) and diastolic (congestive) heart failure: Secondary | ICD-10-CM | POA: Diagnosis not present

## 2023-09-02 DIAGNOSIS — I480 Paroxysmal atrial fibrillation: Secondary | ICD-10-CM | POA: Diagnosis not present

## 2023-09-02 DIAGNOSIS — Z7982 Long term (current) use of aspirin: Secondary | ICD-10-CM | POA: Diagnosis not present

## 2023-09-02 DIAGNOSIS — M48061 Spinal stenosis, lumbar region without neurogenic claudication: Secondary | ICD-10-CM | POA: Diagnosis not present

## 2023-09-02 DIAGNOSIS — I70219 Atherosclerosis of native arteries of extremities with intermittent claudication, unspecified extremity: Secondary | ICD-10-CM | POA: Diagnosis not present

## 2023-09-02 DIAGNOSIS — N39498 Other specified urinary incontinence: Secondary | ICD-10-CM | POA: Diagnosis not present

## 2023-09-02 DIAGNOSIS — I252 Old myocardial infarction: Secondary | ICD-10-CM | POA: Diagnosis not present

## 2023-09-02 DIAGNOSIS — K5732 Diverticulitis of large intestine without perforation or abscess without bleeding: Secondary | ICD-10-CM | POA: Diagnosis not present

## 2023-09-02 DIAGNOSIS — T827XXA Infection and inflammatory reaction due to other cardiac and vascular devices, implants and grafts, initial encounter: Secondary | ICD-10-CM | POA: Diagnosis not present

## 2023-09-02 DIAGNOSIS — Z794 Long term (current) use of insulin: Secondary | ICD-10-CM | POA: Diagnosis not present

## 2023-09-02 DIAGNOSIS — E039 Hypothyroidism, unspecified: Secondary | ICD-10-CM | POA: Diagnosis not present

## 2023-09-02 DIAGNOSIS — E1151 Type 2 diabetes mellitus with diabetic peripheral angiopathy without gangrene: Secondary | ICD-10-CM | POA: Diagnosis not present

## 2023-09-02 DIAGNOSIS — K219 Gastro-esophageal reflux disease without esophagitis: Secondary | ICD-10-CM | POA: Diagnosis not present

## 2023-09-02 DIAGNOSIS — I255 Ischemic cardiomyopathy: Secondary | ICD-10-CM | POA: Diagnosis not present

## 2023-09-04 ENCOUNTER — Encounter: Payer: Self-pay | Admitting: *Deleted

## 2023-09-04 ENCOUNTER — Encounter: Payer: Self-pay | Admitting: Vascular Surgery

## 2023-09-04 ENCOUNTER — Telehealth: Payer: Self-pay

## 2023-09-04 ENCOUNTER — Telehealth: Payer: Self-pay | Admitting: *Deleted

## 2023-09-04 DIAGNOSIS — E039 Hypothyroidism, unspecified: Secondary | ICD-10-CM | POA: Diagnosis not present

## 2023-09-04 DIAGNOSIS — N1832 Chronic kidney disease, stage 3b: Secondary | ICD-10-CM | POA: Diagnosis not present

## 2023-09-04 DIAGNOSIS — I255 Ischemic cardiomyopathy: Secondary | ICD-10-CM | POA: Diagnosis not present

## 2023-09-04 DIAGNOSIS — T827XXA Infection and inflammatory reaction due to other cardiac and vascular devices, implants and grafts, initial encounter: Secondary | ICD-10-CM | POA: Diagnosis not present

## 2023-09-04 DIAGNOSIS — K219 Gastro-esophageal reflux disease without esophagitis: Secondary | ICD-10-CM | POA: Diagnosis not present

## 2023-09-04 DIAGNOSIS — N39498 Other specified urinary incontinence: Secondary | ICD-10-CM | POA: Diagnosis not present

## 2023-09-04 DIAGNOSIS — M48061 Spinal stenosis, lumbar region without neurogenic claudication: Secondary | ICD-10-CM | POA: Diagnosis not present

## 2023-09-04 DIAGNOSIS — I5042 Chronic combined systolic (congestive) and diastolic (congestive) heart failure: Secondary | ICD-10-CM | POA: Diagnosis not present

## 2023-09-04 DIAGNOSIS — N401 Enlarged prostate with lower urinary tract symptoms: Secondary | ICD-10-CM | POA: Diagnosis not present

## 2023-09-04 DIAGNOSIS — M12811 Other specific arthropathies, not elsewhere classified, right shoulder: Secondary | ICD-10-CM | POA: Diagnosis not present

## 2023-09-04 DIAGNOSIS — Z794 Long term (current) use of insulin: Secondary | ICD-10-CM | POA: Diagnosis not present

## 2023-09-04 DIAGNOSIS — I252 Old myocardial infarction: Secondary | ICD-10-CM | POA: Diagnosis not present

## 2023-09-04 DIAGNOSIS — I480 Paroxysmal atrial fibrillation: Secondary | ICD-10-CM | POA: Diagnosis not present

## 2023-09-04 DIAGNOSIS — Z452 Encounter for adjustment and management of vascular access device: Secondary | ICD-10-CM | POA: Diagnosis not present

## 2023-09-04 DIAGNOSIS — I2510 Atherosclerotic heart disease of native coronary artery without angina pectoris: Secondary | ICD-10-CM | POA: Diagnosis not present

## 2023-09-04 DIAGNOSIS — I4729 Other ventricular tachycardia: Secondary | ICD-10-CM | POA: Diagnosis not present

## 2023-09-04 DIAGNOSIS — I70219 Atherosclerosis of native arteries of extremities with intermittent claudication, unspecified extremity: Secondary | ICD-10-CM | POA: Diagnosis not present

## 2023-09-04 DIAGNOSIS — E1151 Type 2 diabetes mellitus with diabetic peripheral angiopathy without gangrene: Secondary | ICD-10-CM | POA: Diagnosis not present

## 2023-09-04 DIAGNOSIS — I130 Hypertensive heart and chronic kidney disease with heart failure and stage 1 through stage 4 chronic kidney disease, or unspecified chronic kidney disease: Secondary | ICD-10-CM | POA: Diagnosis not present

## 2023-09-04 DIAGNOSIS — Z7982 Long term (current) use of aspirin: Secondary | ICD-10-CM | POA: Diagnosis not present

## 2023-09-04 DIAGNOSIS — Z792 Long term (current) use of antibiotics: Secondary | ICD-10-CM | POA: Diagnosis not present

## 2023-09-04 DIAGNOSIS — K5732 Diverticulitis of large intestine without perforation or abscess without bleeding: Secondary | ICD-10-CM | POA: Diagnosis not present

## 2023-09-04 DIAGNOSIS — E7800 Pure hypercholesterolemia, unspecified: Secondary | ICD-10-CM | POA: Diagnosis not present

## 2023-09-04 DIAGNOSIS — R159 Full incontinence of feces: Secondary | ICD-10-CM | POA: Diagnosis not present

## 2023-09-04 NOTE — Telephone Encounter (Signed)
 [name], [facility] called for pt's verbal HH order.  Order:  3x/week for 8 weeks Wound vac check 3x for 8 wks IV ABX blood draw 1 wk for 1 week

## 2023-09-05 NOTE — Discharge Summary (Signed)
 Discharge Summary  Patient ID: [name] [id number] 78 y.o. 04/02/1946  Admit date: 08/25/2023  Discharge date and time: 09/01/2023  3:22 PM   Admitting Physician: Dennis Hoof, MD   Discharge Physician: Dennis Fearing, MD  Admission Diagnoses: Infection of vascular bypass graft (HCC) [T82.7XXA]  Discharge Diagnoses: Infection of vascular bypass graft (HCC) [W09.7XXA]  Admission Condition: serious  Discharged Condition: good  Indication for Admission:  6-year-old male recent underwent angiography to evaluate for right lower extremity ischemia and ultimately had mechanical thrombectomy of the right SFA and anterior tibial arteries with drug-coated balloon angioplasty of the right SFA.  He now follows up approximately 6 months later with acute left groin pain with ultrasound evidence of fluid around the graft.  He is now indicated for operative exploration.   Hospital Course: [name] was admitted on 08/25/22 after close interval follow up in our office with [name]. He had presented to the emergency room on Monday 5/5 with new onset severe left groin pain. He had no inciting factors. He endorsed severe pain in his left groin with movement or palpation. CTA demonstrated some stranding around his left groin suggestive of possible inflammation, infection, etc. He was discharged from the ED. At the time of follow up, he continued to have severe pain in left groin. Duplex showed fluid around proximal portion of the left to right femorofemoral bypass graft that was not seen on CT. This was concerning for graft infection so he was directly admitted to [facility] for IV antibiotics and observation.   On 08/26/23 he was taken to the OR and underwent 1.  Washout of left groin wound with 1 L saline and 1 L of Vashe using Pulsavac 2.  Application of vancomycin /gentamicin  antibiotic beads 3.  Application 38 cm Kerecis 4.  Partial closure of left groin wound with wound VAC placement totaling 4 x 2  cm 5.  Primary repair of graftotomy by [name]. He tolerated the procedure well and was taken to the recovery room in stable condition.   5/11 did not sleep well. Pain overall well controlled. Did have some elevated blood sugars overnight. Left groin with wound VAC with good seal. Palpable pedal pulses. Culture from surgery growing few GPC but final cultures pending. Continued on broad spectrum antibiotics with IV Vancomycin  and Zosyn . Hospitalists consulted for assistance with medical management of hyperglycemia. Semglee  continued, Novolog  8 units TID with meals added per Hospitalists. Entresto  and Jardiance  held due to AKI.   5/12 continue adequate perfusion of left lower extremity. VAC with good seal. Cultures with few GPC and staph aureus. Susceptibilities pending. AKI worsening. Added gentle hydration. Plan for OR on 5/13. Made NPO. Continued elevated blood sugars despite adjustments. Novolog  meal coverage increased to 12 units. Recommended discontinuing Norvasc , holding Isordil , and increasing fluids for blood pressure management and AKI  5/13 taken back to OR for 1.  Washout left groin wound using Pulsavac with 1.5 L saline and 1 L of Vashe 2.  Application 38 cm Kerecis fish skin 3.  Application of negative pressure dressing with blue sponge left groin wound totaling 3 x 1 x 1 cm by [name]. He tolerated this well and was taken to recovery room in stable condition. Blood sugars improved with change in meal coverage Novolog . Serum creatinine slightly improving. Continued light hydration. Cultures with MSSA so IV vancomycin  and Zosyn  discontinued and switched to IV Ancef  based on sensitivities.  5/14 Left groin VAC to suction. Left leg well perfused and  warm.ID Consulted for further antibiotic recommendations for discharge. AKI continued to improve. Home dose of Xarelto  restarted. Continued elevated blood sugars. Lantus  increased to 40 units at bedtime. Blood pressure improved. IV fluids  discontinued. Home VAC orders and HH RN orders placed. [facility] arranged for home VAC needs and IV antibiotics. Infectious Disease recommended PICC line placement with 6 weeks of cefazolin  IV then transition to antibiotics for 4.5 months. Rifampin 300 mg BID added to cefazolin . VAC changed at beside and patient tolerated well.   Remainder of hospital stay consisted of continued work with therapy teams, [facility] management, continued IV antibiotics, PICC placement, close management of blood sugars and monitoring of renal function improvement.   By day 7 he remained stable for discharge home. Home health wound VAC, home health RN for wound VAC changes and IV medication management were arranged. His legs remained well perfused and warm. Outpatient ID follow up arranged. OPAT orders placed for his long term IV antibiotics. He was continued on Aspirin , statin and Xarelto . He will continue carvedilol , amlodipine , Isodril, Jardiance , and Novolog  12 units TID per Hospitalist recommendations. He has follow up arranged in our office in 2 weeks for incision check on 5/29.    Consults: ID and Hospitalist  Treatments: antibiotics: vancomycin , Zosyn , Gentamicin  and Cefazolin , analgesia: acetaminophen , Morphine , and Aspirin , Oxycodone , anticoagulation: LMW heparin  and Rivaroxaban , therapies: PT, OT, RN, and SW, and surgery: 1.  Washout of left groin wound with 1 L saline and 1 L of Vashe using Pulsavac 2.  Application of vancomycin /gentamicin  antibiotic beads 3.  Application 38 cm Kerecis 4.  Partial closure of left groin wound with wound VAC placement totaling 4 x 2 cm 5.  Primary repair of graftotomy 08/26/23  1.  Washout left groin wound using Pulsavac with 1.5 L saline and 1 L of Vashe 2.  Application 38 cm Kerecis fish skin 3.  Application of negative pressure dressing with blue sponge left groin wound totaling 3 x 1 x 1 cm 08/29/23  Disposition: Discharge disposition: 01-Home or Self Care       Patient  Instructions:  Allergies as of 09/01/2023       Reactions   Lisinopril  Swelling, Rash   Rash - face and tounge swell   Levaquin [levofloxacin]         Medication List     STOP taking these medications    Entresto  49-51 MG Generic drug: sacubitril -valsartan        TAKE these medications    amLODipine  5 MG tablet Commonly known as: NORVASC  Take 1 tablet (5 mg total) by mouth daily.   aspirin  EC 81 MG tablet Take 1 tablet (81 mg total) by mouth daily. Swallow whole.   carvedilol  12.5 MG tablet Commonly known as: COREG  Take 1 tablet (12.5 mg total) by mouth 2 (two) times daily with a meal.   ceFAZolin  IVPB Commonly known as: ANCEF  Inject 2 g into the vein every 8 (eight) hours. Indication:  MSSA vascular graft infection First Dose: Yes Last Day of Therapy:  10/10/2023 Labs - Once weekly:  CBC/D and BMP, Labs - Once weekly: ESR and CRP Method of administration: IV Push Method of administration may be changed at the discretion of home infusion pharmacist based upon assessment of the patient and/or caregiver's ability to self-administer the medication ordered.   cetirizine  10 MG tablet Commonly known as: ZYRTEC  Take 1 tablet (10 mg total) by mouth daily.   cyclobenzaprine  10 MG tablet Commonly known as: FLEXERIL  Take 1 tablet (10 mg  total) by mouth 3 (three) times daily as needed for muscle spasms.   ezetimibe  10 MG tablet Commonly known as: ZETIA  Take 1 tablet (10 mg total) by mouth daily.   insulin  glargine 100 unit/mL Sopn Commonly known as: LANTUS  40 units subcut daily at nighttime What changed: additional instructions   isosorbide  dinitrate 10 MG tablet Commonly known as: ISORDIL  Take 10 mg by mouth 2 (two) times daily.   Jardiance  10 MG Tabs tablet Generic drug: empagliflozin  Take 1 tablet (10 mg total) by mouth daily.   loperamide  2 MG tablet Commonly known as: IMODIUM  A-D Take 2 mg by mouth 4 (four) times daily as needed for diarrhea or loose  stools.   nitroGLYCERIN  0.4 MG SL tablet Commonly known as: NITROSTAT  Place 0.4 mg under the tongue every 5 (five) minutes as needed for chest pain.   NovoLOG  FlexPen 100 UNIT/ML FlexPen Generic drug: insulin  aspart Inject 12 Units into the skin 3 (three) times daily with meals.   oxyCODONE -acetaminophen  5-325 MG tablet Commonly known as: Percocet Take 1 tablet by mouth every 6 (six) hours as needed for severe pain (pain score 7-10).   rivaroxaban  20 MG Tabs tablet Commonly known as: XARELTO  Take 1 tablet (20 mg total) by mouth daily with supper.   rosuvastatin  40 MG tablet Commonly known as: CRESTOR  TAKE 1 TABLET(40 MG) BY MOUTH DAILY   tamsulosin  0.4 MG Caps capsule Commonly known as: FLOMAX  Take 0.8 mg by mouth every evening.               Discharge Care Instructions  (From admission, onward)           Start     Ordered   09/01/23 0000  Change dressing on IV access line weekly and PRN  (Home infusion instructions - [facility] )        09/01/23 0817   09/01/23 0000  Discharge wound care:       Comments: Home Health RN will clean and change left groin wound VAC. Wash around staples with mild soap and water  prior to replacing [facility]   09/01/23 0817           Activity: activity as tolerated, no driving while on analgesics, and no heavy lifting for 6 weeks Diet: regular diet and low fat, low cholesterol diet Wound Care: [facility] RN will clean and change your wound VAC 3x/week  Follow-up with [facility] in 2 weeks.  SignedDeneen Finical, PA-C 09/05/2023 2:37 PM [facility] Office: [phone]

## 2023-09-06 DIAGNOSIS — E7800 Pure hypercholesterolemia, unspecified: Secondary | ICD-10-CM | POA: Diagnosis not present

## 2023-09-06 DIAGNOSIS — I4729 Other ventricular tachycardia: Secondary | ICD-10-CM | POA: Diagnosis not present

## 2023-09-06 DIAGNOSIS — N401 Enlarged prostate with lower urinary tract symptoms: Secondary | ICD-10-CM | POA: Diagnosis not present

## 2023-09-06 DIAGNOSIS — N1832 Chronic kidney disease, stage 3b: Secondary | ICD-10-CM | POA: Diagnosis not present

## 2023-09-06 DIAGNOSIS — M12811 Other specific arthropathies, not elsewhere classified, right shoulder: Secondary | ICD-10-CM | POA: Diagnosis not present

## 2023-09-06 DIAGNOSIS — I480 Paroxysmal atrial fibrillation: Secondary | ICD-10-CM | POA: Diagnosis not present

## 2023-09-06 DIAGNOSIS — Z452 Encounter for adjustment and management of vascular access device: Secondary | ICD-10-CM | POA: Diagnosis not present

## 2023-09-06 DIAGNOSIS — I2510 Atherosclerotic heart disease of native coronary artery without angina pectoris: Secondary | ICD-10-CM | POA: Diagnosis not present

## 2023-09-06 DIAGNOSIS — Z794 Long term (current) use of insulin: Secondary | ICD-10-CM | POA: Diagnosis not present

## 2023-09-06 DIAGNOSIS — E039 Hypothyroidism, unspecified: Secondary | ICD-10-CM | POA: Diagnosis not present

## 2023-09-06 DIAGNOSIS — I5042 Chronic combined systolic (congestive) and diastolic (congestive) heart failure: Secondary | ICD-10-CM | POA: Diagnosis not present

## 2023-09-06 DIAGNOSIS — R159 Full incontinence of feces: Secondary | ICD-10-CM | POA: Diagnosis not present

## 2023-09-06 DIAGNOSIS — K219 Gastro-esophageal reflux disease without esophagitis: Secondary | ICD-10-CM | POA: Diagnosis not present

## 2023-09-06 DIAGNOSIS — I70219 Atherosclerosis of native arteries of extremities with intermittent claudication, unspecified extremity: Secondary | ICD-10-CM | POA: Diagnosis not present

## 2023-09-06 DIAGNOSIS — N39498 Other specified urinary incontinence: Secondary | ICD-10-CM | POA: Diagnosis not present

## 2023-09-06 DIAGNOSIS — I130 Hypertensive heart and chronic kidney disease with heart failure and stage 1 through stage 4 chronic kidney disease, or unspecified chronic kidney disease: Secondary | ICD-10-CM | POA: Diagnosis not present

## 2023-09-06 DIAGNOSIS — I252 Old myocardial infarction: Secondary | ICD-10-CM | POA: Diagnosis not present

## 2023-09-06 DIAGNOSIS — I255 Ischemic cardiomyopathy: Secondary | ICD-10-CM | POA: Diagnosis not present

## 2023-09-06 DIAGNOSIS — K5732 Diverticulitis of large intestine without perforation or abscess without bleeding: Secondary | ICD-10-CM | POA: Diagnosis not present

## 2023-09-06 DIAGNOSIS — E1151 Type 2 diabetes mellitus with diabetic peripheral angiopathy without gangrene: Secondary | ICD-10-CM | POA: Diagnosis not present

## 2023-09-06 DIAGNOSIS — Z7982 Long term (current) use of aspirin: Secondary | ICD-10-CM | POA: Diagnosis not present

## 2023-09-06 DIAGNOSIS — Z792 Long term (current) use of antibiotics: Secondary | ICD-10-CM | POA: Diagnosis not present

## 2023-09-06 DIAGNOSIS — T827XXA Infection and inflammatory reaction due to other cardiac and vascular devices, implants and grafts, initial encounter: Secondary | ICD-10-CM | POA: Diagnosis not present

## 2023-09-06 DIAGNOSIS — M48061 Spinal stenosis, lumbar region without neurogenic claudication: Secondary | ICD-10-CM | POA: Diagnosis not present

## 2023-09-07 DIAGNOSIS — Z794 Long term (current) use of insulin: Secondary | ICD-10-CM | POA: Diagnosis not present

## 2023-09-07 DIAGNOSIS — T827XXD Infection and inflammatory reaction due to other cardiac and vascular devices, implants and grafts, subsequent encounter: Secondary | ICD-10-CM | POA: Diagnosis not present

## 2023-09-07 DIAGNOSIS — E1122 Type 2 diabetes mellitus with diabetic chronic kidney disease: Secondary | ICD-10-CM | POA: Diagnosis not present

## 2023-09-07 DIAGNOSIS — N1831 Chronic kidney disease, stage 3a: Secondary | ICD-10-CM | POA: Diagnosis not present

## 2023-09-08 DIAGNOSIS — N39498 Other specified urinary incontinence: Secondary | ICD-10-CM | POA: Diagnosis not present

## 2023-09-08 DIAGNOSIS — E1151 Type 2 diabetes mellitus with diabetic peripheral angiopathy without gangrene: Secondary | ICD-10-CM | POA: Diagnosis not present

## 2023-09-08 DIAGNOSIS — N1831 Chronic kidney disease, stage 3a: Secondary | ICD-10-CM | POA: Diagnosis not present

## 2023-09-08 DIAGNOSIS — K5732 Diverticulitis of large intestine without perforation or abscess without bleeding: Secondary | ICD-10-CM | POA: Diagnosis not present

## 2023-09-08 DIAGNOSIS — I255 Ischemic cardiomyopathy: Secondary | ICD-10-CM | POA: Diagnosis not present

## 2023-09-08 DIAGNOSIS — N1832 Chronic kidney disease, stage 3b: Secondary | ICD-10-CM | POA: Diagnosis not present

## 2023-09-08 DIAGNOSIS — I130 Hypertensive heart and chronic kidney disease with heart failure and stage 1 through stage 4 chronic kidney disease, or unspecified chronic kidney disease: Secondary | ICD-10-CM | POA: Diagnosis not present

## 2023-09-08 DIAGNOSIS — R159 Full incontinence of feces: Secondary | ICD-10-CM | POA: Diagnosis not present

## 2023-09-08 DIAGNOSIS — Z452 Encounter for adjustment and management of vascular access device: Secondary | ICD-10-CM | POA: Diagnosis not present

## 2023-09-08 DIAGNOSIS — Z7982 Long term (current) use of aspirin: Secondary | ICD-10-CM | POA: Diagnosis not present

## 2023-09-08 DIAGNOSIS — T827XXA Infection and inflammatory reaction due to other cardiac and vascular devices, implants and grafts, initial encounter: Secondary | ICD-10-CM | POA: Diagnosis not present

## 2023-09-08 DIAGNOSIS — M12811 Other specific arthropathies, not elsewhere classified, right shoulder: Secondary | ICD-10-CM | POA: Diagnosis not present

## 2023-09-08 DIAGNOSIS — E7800 Pure hypercholesterolemia, unspecified: Secondary | ICD-10-CM | POA: Diagnosis not present

## 2023-09-08 DIAGNOSIS — I110 Hypertensive heart disease with heart failure: Secondary | ICD-10-CM | POA: Diagnosis not present

## 2023-09-08 DIAGNOSIS — I5022 Chronic systolic (congestive) heart failure: Secondary | ICD-10-CM | POA: Diagnosis not present

## 2023-09-08 DIAGNOSIS — Z792 Long term (current) use of antibiotics: Secondary | ICD-10-CM | POA: Diagnosis not present

## 2023-09-08 DIAGNOSIS — M48061 Spinal stenosis, lumbar region without neurogenic claudication: Secondary | ICD-10-CM | POA: Diagnosis not present

## 2023-09-08 DIAGNOSIS — I252 Old myocardial infarction: Secondary | ICD-10-CM | POA: Diagnosis not present

## 2023-09-08 DIAGNOSIS — I480 Paroxysmal atrial fibrillation: Secondary | ICD-10-CM | POA: Diagnosis not present

## 2023-09-08 DIAGNOSIS — I4729 Other ventricular tachycardia: Secondary | ICD-10-CM | POA: Diagnosis not present

## 2023-09-08 DIAGNOSIS — N401 Enlarged prostate with lower urinary tract symptoms: Secondary | ICD-10-CM | POA: Diagnosis not present

## 2023-09-08 DIAGNOSIS — I2510 Atherosclerotic heart disease of native coronary artery without angina pectoris: Secondary | ICD-10-CM | POA: Diagnosis not present

## 2023-09-08 DIAGNOSIS — Z794 Long term (current) use of insulin: Secondary | ICD-10-CM | POA: Diagnosis not present

## 2023-09-08 DIAGNOSIS — K219 Gastro-esophageal reflux disease without esophagitis: Secondary | ICD-10-CM | POA: Diagnosis not present

## 2023-09-08 DIAGNOSIS — E1122 Type 2 diabetes mellitus with diabetic chronic kidney disease: Secondary | ICD-10-CM | POA: Diagnosis not present

## 2023-09-08 DIAGNOSIS — I5042 Chronic combined systolic (congestive) and diastolic (congestive) heart failure: Secondary | ICD-10-CM | POA: Diagnosis not present

## 2023-09-08 DIAGNOSIS — I70219 Atherosclerosis of native arteries of extremities with intermittent claudication, unspecified extremity: Secondary | ICD-10-CM | POA: Diagnosis not present

## 2023-09-08 DIAGNOSIS — E039 Hypothyroidism, unspecified: Secondary | ICD-10-CM | POA: Diagnosis not present

## 2023-09-11 DIAGNOSIS — E039 Hypothyroidism, unspecified: Secondary | ICD-10-CM | POA: Diagnosis not present

## 2023-09-11 DIAGNOSIS — K5732 Diverticulitis of large intestine without perforation or abscess without bleeding: Secondary | ICD-10-CM | POA: Diagnosis not present

## 2023-09-11 DIAGNOSIS — I70219 Atherosclerosis of native arteries of extremities with intermittent claudication, unspecified extremity: Secondary | ICD-10-CM | POA: Diagnosis not present

## 2023-09-11 DIAGNOSIS — I252 Old myocardial infarction: Secondary | ICD-10-CM | POA: Diagnosis not present

## 2023-09-11 DIAGNOSIS — I4729 Other ventricular tachycardia: Secondary | ICD-10-CM | POA: Diagnosis not present

## 2023-09-11 DIAGNOSIS — M48061 Spinal stenosis, lumbar region without neurogenic claudication: Secondary | ICD-10-CM | POA: Diagnosis not present

## 2023-09-11 DIAGNOSIS — Z792 Long term (current) use of antibiotics: Secondary | ICD-10-CM | POA: Diagnosis not present

## 2023-09-11 DIAGNOSIS — T827XXA Infection and inflammatory reaction due to other cardiac and vascular devices, implants and grafts, initial encounter: Secondary | ICD-10-CM | POA: Diagnosis not present

## 2023-09-11 DIAGNOSIS — I480 Paroxysmal atrial fibrillation: Secondary | ICD-10-CM | POA: Diagnosis not present

## 2023-09-11 DIAGNOSIS — I255 Ischemic cardiomyopathy: Secondary | ICD-10-CM | POA: Diagnosis not present

## 2023-09-11 DIAGNOSIS — E7800 Pure hypercholesterolemia, unspecified: Secondary | ICD-10-CM | POA: Diagnosis not present

## 2023-09-11 DIAGNOSIS — Z452 Encounter for adjustment and management of vascular access device: Secondary | ICD-10-CM | POA: Diagnosis not present

## 2023-09-11 DIAGNOSIS — Z7982 Long term (current) use of aspirin: Secondary | ICD-10-CM | POA: Diagnosis not present

## 2023-09-11 DIAGNOSIS — I130 Hypertensive heart and chronic kidney disease with heart failure and stage 1 through stage 4 chronic kidney disease, or unspecified chronic kidney disease: Secondary | ICD-10-CM | POA: Diagnosis not present

## 2023-09-11 DIAGNOSIS — M12811 Other specific arthropathies, not elsewhere classified, right shoulder: Secondary | ICD-10-CM | POA: Diagnosis not present

## 2023-09-11 DIAGNOSIS — E1151 Type 2 diabetes mellitus with diabetic peripheral angiopathy without gangrene: Secondary | ICD-10-CM | POA: Diagnosis not present

## 2023-09-11 DIAGNOSIS — K219 Gastro-esophageal reflux disease without esophagitis: Secondary | ICD-10-CM | POA: Diagnosis not present

## 2023-09-11 DIAGNOSIS — R159 Full incontinence of feces: Secondary | ICD-10-CM | POA: Diagnosis not present

## 2023-09-11 DIAGNOSIS — N39498 Other specified urinary incontinence: Secondary | ICD-10-CM | POA: Diagnosis not present

## 2023-09-11 DIAGNOSIS — N1832 Chronic kidney disease, stage 3b: Secondary | ICD-10-CM | POA: Diagnosis not present

## 2023-09-11 DIAGNOSIS — Z794 Long term (current) use of insulin: Secondary | ICD-10-CM | POA: Diagnosis not present

## 2023-09-11 DIAGNOSIS — I5042 Chronic combined systolic (congestive) and diastolic (congestive) heart failure: Secondary | ICD-10-CM | POA: Diagnosis not present

## 2023-09-11 DIAGNOSIS — I2510 Atherosclerotic heart disease of native coronary artery without angina pectoris: Secondary | ICD-10-CM | POA: Diagnosis not present

## 2023-09-11 DIAGNOSIS — N401 Enlarged prostate with lower urinary tract symptoms: Secondary | ICD-10-CM | POA: Diagnosis not present

## 2023-09-13 DIAGNOSIS — I110 Hypertensive heart disease with heart failure: Secondary | ICD-10-CM | POA: Diagnosis not present

## 2023-09-13 DIAGNOSIS — Z794 Long term (current) use of insulin: Secondary | ICD-10-CM | POA: Diagnosis not present

## 2023-09-13 DIAGNOSIS — E1151 Type 2 diabetes mellitus with diabetic peripheral angiopathy without gangrene: Secondary | ICD-10-CM | POA: Diagnosis not present

## 2023-09-13 DIAGNOSIS — T827XXA Infection and inflammatory reaction due to other cardiac and vascular devices, implants and grafts, initial encounter: Secondary | ICD-10-CM | POA: Diagnosis not present

## 2023-09-13 DIAGNOSIS — N1831 Chronic kidney disease, stage 3a: Secondary | ICD-10-CM | POA: Diagnosis not present

## 2023-09-13 DIAGNOSIS — M12811 Other specific arthropathies, not elsewhere classified, right shoulder: Secondary | ICD-10-CM | POA: Diagnosis not present

## 2023-09-13 DIAGNOSIS — N401 Enlarged prostate with lower urinary tract symptoms: Secondary | ICD-10-CM | POA: Diagnosis not present

## 2023-09-13 DIAGNOSIS — I5022 Chronic systolic (congestive) heart failure: Secondary | ICD-10-CM | POA: Diagnosis not present

## 2023-09-13 DIAGNOSIS — Z452 Encounter for adjustment and management of vascular access device: Secondary | ICD-10-CM | POA: Diagnosis not present

## 2023-09-13 DIAGNOSIS — E039 Hypothyroidism, unspecified: Secondary | ICD-10-CM | POA: Diagnosis not present

## 2023-09-13 DIAGNOSIS — Z792 Long term (current) use of antibiotics: Secondary | ICD-10-CM | POA: Diagnosis not present

## 2023-09-13 DIAGNOSIS — K219 Gastro-esophageal reflux disease without esophagitis: Secondary | ICD-10-CM | POA: Diagnosis not present

## 2023-09-13 DIAGNOSIS — I480 Paroxysmal atrial fibrillation: Secondary | ICD-10-CM | POA: Diagnosis not present

## 2023-09-13 DIAGNOSIS — M48061 Spinal stenosis, lumbar region without neurogenic claudication: Secondary | ICD-10-CM | POA: Diagnosis not present

## 2023-09-13 DIAGNOSIS — N39498 Other specified urinary incontinence: Secondary | ICD-10-CM | POA: Diagnosis not present

## 2023-09-13 DIAGNOSIS — E7800 Pure hypercholesterolemia, unspecified: Secondary | ICD-10-CM | POA: Diagnosis not present

## 2023-09-13 DIAGNOSIS — I252 Old myocardial infarction: Secondary | ICD-10-CM | POA: Diagnosis not present

## 2023-09-13 DIAGNOSIS — I255 Ischemic cardiomyopathy: Secondary | ICD-10-CM | POA: Diagnosis not present

## 2023-09-13 DIAGNOSIS — R159 Full incontinence of feces: Secondary | ICD-10-CM | POA: Diagnosis not present

## 2023-09-13 DIAGNOSIS — E1122 Type 2 diabetes mellitus with diabetic chronic kidney disease: Secondary | ICD-10-CM | POA: Diagnosis not present

## 2023-09-13 DIAGNOSIS — I5042 Chronic combined systolic (congestive) and diastolic (congestive) heart failure: Secondary | ICD-10-CM | POA: Diagnosis not present

## 2023-09-13 DIAGNOSIS — I4729 Other ventricular tachycardia: Secondary | ICD-10-CM | POA: Diagnosis not present

## 2023-09-13 DIAGNOSIS — I2510 Atherosclerotic heart disease of native coronary artery without angina pectoris: Secondary | ICD-10-CM | POA: Diagnosis not present

## 2023-09-13 DIAGNOSIS — I130 Hypertensive heart and chronic kidney disease with heart failure and stage 1 through stage 4 chronic kidney disease, or unspecified chronic kidney disease: Secondary | ICD-10-CM | POA: Diagnosis not present

## 2023-09-13 DIAGNOSIS — K5732 Diverticulitis of large intestine without perforation or abscess without bleeding: Secondary | ICD-10-CM | POA: Diagnosis not present

## 2023-09-13 DIAGNOSIS — N1832 Chronic kidney disease, stage 3b: Secondary | ICD-10-CM | POA: Diagnosis not present

## 2023-09-13 DIAGNOSIS — I70219 Atherosclerosis of native arteries of extremities with intermittent claudication, unspecified extremity: Secondary | ICD-10-CM | POA: Diagnosis not present

## 2023-09-13 DIAGNOSIS — Z7982 Long term (current) use of aspirin: Secondary | ICD-10-CM | POA: Diagnosis not present

## 2023-09-14 ENCOUNTER — Ambulatory Visit: Attending: Vascular Surgery | Admitting: Physician Assistant

## 2023-09-14 VITALS — BP 143/82 | HR 85 | Temp 98.3°F | Ht 67.0 in | Wt 179.1 lb

## 2023-09-14 DIAGNOSIS — T827XXA Infection and inflammatory reaction due to other cardiac and vascular devices, implants and grafts, initial encounter: Secondary | ICD-10-CM

## 2023-09-14 DIAGNOSIS — I739 Peripheral vascular disease, unspecified: Secondary | ICD-10-CM

## 2023-09-14 NOTE — Progress Notes (Signed)
 POST OPERATIVE OFFICE NOTE    CC:  F/u for surgery  HPI:  This is a 78 y.o. male who is s/p 1.  Washout of left groin wound with 1 L saline and 1 L of Vashe using Pulsavac 2.  Application of vancomycin /gentamicin  antibiotic beads 3.  Application 38 cm Kerecis 4.  Partial closure of left groin wound with wound VAC placement totaling 4 x 2 cm 5.  Primary repair of graftotomy on 08/26/23 by Dr. Vikki Graves. This was performed secondary to infected left to right femorofemoral bypass graft. He was discharged with PICC line and 6 weeks of IV antibiotics followed by oral antibiotics per ID. He has been receiving HH to change wound VAC 3x/week.   Pt returns today for follow up with his daughter.  Pt states overall he is doing well. He denies any pain His last vac change was on Monday they felt like the VAC did not need replaced so they instructed him to just apply dry gauze. He has been washing the area in shower with soap and water . He says minimal bloody drainage on gauze. He did have a lot of yeast present in his groins and they prescribed him some nystatin powder to apply, which has helped. He otherwise says he is walking without any pain. RN is coming out to administer his antibiotics. He is still taking his aspirin , statin and Xarelto . Denies any fever or chills.   Allergies  Allergen Reactions   Lisinopril  Swelling and Rash    Rash - face and tounge swell   Levaquin [Levofloxacin]     Current Outpatient Medications  Medication Sig Dispense Refill   amLODipine  (NORVASC ) 5 MG tablet Take 1 tablet (5 mg total) by mouth daily. 90 tablet 3   aspirin  EC 81 MG tablet Take 1 tablet (81 mg total) by mouth daily. Swallow whole. 90 tablet 3   carvedilol  (COREG ) 12.5 MG tablet Take 1 tablet (12.5 mg total) by mouth 2 (two) times daily with a meal. 180 tablet 3   ceFAZolin  (ANCEF ) IVPB Inject 2 g into the vein every 8 (eight) hours. Indication:  MSSA vascular graft infection First Dose: Yes Last Day of  Therapy:  10/10/2023 Labs - Once weekly:  CBC/D and BMP, Labs - Once weekly: ESR and CRP Method of administration: IV Push Method of administration may be changed at the discretion of home infusion pharmacist based upon assessment of the patient and/or caregiver's ability to self-administer the medication ordered. 120 Units 0   cetirizine  (ZYRTEC ) 10 MG tablet Take 1 tablet (10 mg total) by mouth daily. 90 tablet 0   cyclobenzaprine  (FLEXERIL ) 10 MG tablet Take 1 tablet (10 mg total) by mouth 3 (three) times daily as needed for muscle spasms. 15 tablet 0   ezetimibe  (ZETIA ) 10 MG tablet Take 1 tablet (10 mg total) by mouth daily. 90 tablet 3   insulin  aspart (NOVOLOG ) 100 UNIT/ML FlexPen Inject 12 Units into the skin 3 (three) times daily with meals. 15 mL 11   insulin  glargine (LANTUS ) 100 unit/mL SOPN 40 units subcut daily at nighttime     isosorbide  dinitrate (ISORDIL ) 10 MG tablet Take 10 mg by mouth 2 (two) times daily.     JARDIANCE  10 MG TABS tablet Take 1 tablet (10 mg total) by mouth daily. 30 tablet 2   loperamide  (IMODIUM  A-D) 2 MG tablet Take 2 mg by mouth 4 (four) times daily as needed for diarrhea or loose stools.     nitroGLYCERIN  (NITROSTAT ) 0.4  MG SL tablet Place 0.4 mg under the tongue every 5 (five) minutes as needed for chest pain.     oxyCODONE -acetaminophen  (PERCOCET) 5-325 MG tablet Take 1 tablet by mouth every 6 (six) hours as needed for severe pain (pain score 7-10). 20 tablet 0   rivaroxaban  (XARELTO ) 20 MG TABS tablet Take 1 tablet (20 mg total) by mouth daily with supper. 30 tablet 3   rosuvastatin  (CRESTOR ) 40 MG tablet TAKE 1 TABLET(40 MG) BY MOUTH DAILY 90 tablet 1   tamsulosin  (FLOMAX ) 0.4 MG CAPS capsule Take 0.8 mg by mouth every evening.     No current facility-administered medications for this visit.     ROS:  See HPI  Physical Exam:  Vitals:   09/14/23 1445  BP: (!) 143/82  Pulse: 85  Temp: 98.3 F (36.8 C)  SpO2: 91%    Incision:  left groin  healing well as shown below. Healthy granulation tissue in wound bed. Staples left intact. No drainage. No overt signs of infect. Surrounding skin red due to irritation.   Extremities:  BLE well perfused and warm  Neuro: alert and oriented Abdomen:  soft   Assessment/Plan:  This is a 78 y.o. male who is s/p: 1.  Washout of left groin wound with 1 L saline and 1 L of Vashe using Pulsavac 2.  Application of vancomycin /gentamicin  antibiotic beads 3.  Application 38 cm Kerecis4.  Partial closure of left groin wound with wound VAC placement totaling 4 x 2 cm 5.  Primary repair of graftotomy on 08/26/23 by Dr. Vikki Graves. This was performed secondary to infected left to right femorofemoral bypass graft. He was discharged with PICC line and 6 weeks of IV antibiotics followed by oral antibiotics per ID. Okay to keep wound VAC off and transition to wet to dry. Staples left intact for 1 more week - follow up with ID for antibiotic management - continue nystatin for yeast infection - Continue Aspirin , Xarelto  and statin - instructed  pt and his daughter on wet to dry dressing changes daily - He will return in 1 week for wound check and staple removal   Wynonia Hedges, [facility] [phone]   Clinic MD:  Rosalva Comber

## 2023-09-15 ENCOUNTER — Telehealth: Payer: Self-pay | Admitting: Cardiology

## 2023-09-15 DIAGNOSIS — T827XXA Infection and inflammatory reaction due to other cardiac and vascular devices, implants and grafts, initial encounter: Secondary | ICD-10-CM | POA: Diagnosis not present

## 2023-09-15 MED ORDER — ISOSORBIDE DINITRATE 10 MG PO TABS: 10.0000 mg | ORAL_TABLET | Freq: Two times a day (BID) | ORAL | 3 refills | Status: DC

## 2023-09-15 NOTE — Telephone Encounter (Signed)
 Pt is requesting a refill on medication isosorbide  dinitrate 10 mg tablets. Dr. Krasowski did not prescribe this medication. Would Dr. Krasowski like to refill this medication? Please address

## 2023-09-15 NOTE — Telephone Encounter (Signed)
*  STAT* If patient is at the pharmacy, call can be transferred to refill team.   1. Which medications need to be refilled? (please list name of each medication and dose if known)   isosorbide  dinitrate (ISORDIL ) 10 MG tablet     2. Would you like to learn more about the convenience, safety, & potential cost savings by using the [facility]?   3. Are you open to using the [facility] (Type [facility].  ).   4. Which pharmacy/location (including street and city if local pharmacy) is medication to be sent to?  [facility] [phone] - [place], [place] - [address] OF [address]        5. Do they need a 30 day or 90 day supply? 90 day

## 2023-09-15 NOTE — Telephone Encounter (Signed)
 Refill of Isosorbide  dinitrate 10 mg sent to [facility] on [address] in [place].

## 2023-09-18 DIAGNOSIS — N401 Enlarged prostate with lower urinary tract symptoms: Secondary | ICD-10-CM | POA: Diagnosis not present

## 2023-09-18 DIAGNOSIS — Z7982 Long term (current) use of aspirin: Secondary | ICD-10-CM | POA: Diagnosis not present

## 2023-09-18 DIAGNOSIS — K5732 Diverticulitis of large intestine without perforation or abscess without bleeding: Secondary | ICD-10-CM | POA: Diagnosis not present

## 2023-09-18 DIAGNOSIS — I2510 Atherosclerotic heart disease of native coronary artery without angina pectoris: Secondary | ICD-10-CM | POA: Diagnosis not present

## 2023-09-18 DIAGNOSIS — R159 Full incontinence of feces: Secondary | ICD-10-CM | POA: Diagnosis not present

## 2023-09-18 DIAGNOSIS — N39498 Other specified urinary incontinence: Secondary | ICD-10-CM | POA: Diagnosis not present

## 2023-09-18 DIAGNOSIS — I130 Hypertensive heart and chronic kidney disease with heart failure and stage 1 through stage 4 chronic kidney disease, or unspecified chronic kidney disease: Secondary | ICD-10-CM | POA: Diagnosis not present

## 2023-09-18 DIAGNOSIS — T827XXA Infection and inflammatory reaction due to other cardiac and vascular devices, implants and grafts, initial encounter: Secondary | ICD-10-CM | POA: Diagnosis not present

## 2023-09-18 DIAGNOSIS — I255 Ischemic cardiomyopathy: Secondary | ICD-10-CM | POA: Diagnosis not present

## 2023-09-18 DIAGNOSIS — N1832 Chronic kidney disease, stage 3b: Secondary | ICD-10-CM | POA: Diagnosis not present

## 2023-09-18 DIAGNOSIS — Z452 Encounter for adjustment and management of vascular access device: Secondary | ICD-10-CM | POA: Diagnosis not present

## 2023-09-18 DIAGNOSIS — E039 Hypothyroidism, unspecified: Secondary | ICD-10-CM | POA: Diagnosis not present

## 2023-09-18 DIAGNOSIS — I70219 Atherosclerosis of native arteries of extremities with intermittent claudication, unspecified extremity: Secondary | ICD-10-CM | POA: Diagnosis not present

## 2023-09-18 DIAGNOSIS — I5042 Chronic combined systolic (congestive) and diastolic (congestive) heart failure: Secondary | ICD-10-CM | POA: Diagnosis not present

## 2023-09-18 DIAGNOSIS — K219 Gastro-esophageal reflux disease without esophagitis: Secondary | ICD-10-CM | POA: Diagnosis not present

## 2023-09-18 DIAGNOSIS — E1151 Type 2 diabetes mellitus with diabetic peripheral angiopathy without gangrene: Secondary | ICD-10-CM | POA: Diagnosis not present

## 2023-09-18 DIAGNOSIS — M48061 Spinal stenosis, lumbar region without neurogenic claudication: Secondary | ICD-10-CM | POA: Diagnosis not present

## 2023-09-18 DIAGNOSIS — Z794 Long term (current) use of insulin: Secondary | ICD-10-CM | POA: Diagnosis not present

## 2023-09-18 DIAGNOSIS — E7800 Pure hypercholesterolemia, unspecified: Secondary | ICD-10-CM | POA: Diagnosis not present

## 2023-09-18 DIAGNOSIS — I480 Paroxysmal atrial fibrillation: Secondary | ICD-10-CM | POA: Diagnosis not present

## 2023-09-18 DIAGNOSIS — M12811 Other specific arthropathies, not elsewhere classified, right shoulder: Secondary | ICD-10-CM | POA: Diagnosis not present

## 2023-09-18 DIAGNOSIS — I252 Old myocardial infarction: Secondary | ICD-10-CM | POA: Diagnosis not present

## 2023-09-18 DIAGNOSIS — I4729 Other ventricular tachycardia: Secondary | ICD-10-CM | POA: Diagnosis not present

## 2023-09-18 DIAGNOSIS — Z792 Long term (current) use of antibiotics: Secondary | ICD-10-CM | POA: Diagnosis not present

## 2023-09-20 ENCOUNTER — Encounter: Payer: Self-pay | Admitting: Physician Assistant

## 2023-09-20 ENCOUNTER — Ambulatory Visit: Attending: Vascular Surgery | Admitting: Physician Assistant

## 2023-09-20 VITALS — BP 167/85 | HR 100 | Temp 98.5°F | Ht 67.0 in | Wt 179.2 lb

## 2023-09-20 DIAGNOSIS — T827XXA Infection and inflammatory reaction due to other cardiac and vascular devices, implants and grafts, initial encounter: Secondary | ICD-10-CM

## 2023-09-20 NOTE — Progress Notes (Signed)
 POST OPERATIVE OFFICE NOTE    CC:  F/u for surgery  HPI:  [name] is a 78 y.o. male who is here for wound check.  He recently underwent the following procedure:  1.  Washout of left groin wound with 1 L saline and 1 L of Vashe using Pulsavac  2.  Application of vancomycin /gentamicin  antibiotic beads 3.  Application 38 cm Kerecis 4.  Partial closure of left groin wound with wound VAC placement totaling 4 x 2 cm 5.  Primary repair of graftotomy on 08/26/23 by Dr. Vikki Graves. This was performed secondary to infected left to right femorofemoral bypass graft. He was discharged with PICC line and 6 weeks of IV antibiotics followed by oral antibiotics per ID. He has been receiving HH to change wound VAC 3x/week.   At his last visit with our office he was doing well.  His wound was significantly smaller in size and he was transitioned to wet-to-dry dressings.  He returns today for follow-up.  He has no complaints at today's visit.  He continues to have wet-to-dry dressing changes daily to his left groin.  He denies any puslike drainage, tenderness, or erythema.  He denies any fevers or chills.   Allergies  Allergen Reactions   Lisinopril  Swelling and Rash    Rash - face and tounge swell   Levaquin [Levofloxacin]     Current Outpatient Medications  Medication Sig Dispense Refill   amLODipine  (NORVASC ) 5 MG tablet Take 1 tablet (5 mg total) by mouth daily. 90 tablet 3   aspirin  EC 81 MG tablet Take 1 tablet (81 mg total) by mouth daily. Swallow whole. 90 tablet 3   carvedilol  (COREG ) 12.5 MG tablet Take 1 tablet (12.5 mg total) by mouth 2 (two) times daily with a meal. 180 tablet 3   ceFAZolin  (ANCEF ) IVPB Inject 2 g into the vein every 8 (eight) hours. Indication:  MSSA vascular graft infection First Dose: Yes Last Day of Therapy:  10/10/2023 Labs - Once weekly:  CBC/D and BMP, Labs - Once weekly: ESR and CRP Method of administration: IV Push Method of administration may be changed at the  discretion of home infusion pharmacist based upon assessment of the patient and/or caregiver's ability to self-administer the medication ordered. 120 Units 0   cetirizine  (ZYRTEC ) 10 MG tablet Take 1 tablet (10 mg total) by mouth daily. 90 tablet 0   cyclobenzaprine  (FLEXERIL ) 10 MG tablet Take 1 tablet (10 mg total) by mouth 3 (three) times daily as needed for muscle spasms. 15 tablet 0   ezetimibe  (ZETIA ) 10 MG tablet Take 1 tablet (10 mg total) by mouth daily. 90 tablet 3   insulin  aspart (NOVOLOG ) 100 UNIT/ML FlexPen Inject 12 Units into the skin 3 (three) times daily with meals. 15 mL 11   insulin  glargine (LANTUS ) 100 unit/mL SOPN 40 units subcut daily at nighttime     isosorbide  dinitrate (ISORDIL ) 10 MG tablet Take 1 tablet (10 mg total) by mouth 2 (two) times daily. 180 tablet 3   JARDIANCE  10 MG TABS tablet Take 1 tablet (10 mg total) by mouth daily. 30 tablet 2   loperamide  (IMODIUM  A-D) 2 MG tablet Take 2 mg by mouth 4 (four) times daily as needed for diarrhea or loose stools.     nitroGLYCERIN  (NITROSTAT ) 0.4 MG SL tablet Place 0.4 mg under the tongue every 5 (five) minutes as needed for chest pain.     oxyCODONE -acetaminophen  (PERCOCET) 5-325 MG tablet Take 1 tablet by mouth every  6 (six) hours as needed for severe pain (pain score 7-10). 20 tablet 0   rivaroxaban  (XARELTO ) 20 MG TABS tablet Take 1 tablet (20 mg total) by mouth daily with supper. 30 tablet 3   rosuvastatin  (CRESTOR ) 40 MG tablet TAKE 1 TABLET(40 MG) BY MOUTH DAILY 90 tablet 1   tamsulosin  (FLOMAX ) 0.4 MG CAPS capsule Take 0.8 mg by mouth every evening.     No current facility-administered medications for this visit.     ROS:  See HPI  Physical Exam:   Incision: Left groin wound nearly healed without signs of infection.  All staples removed Extremities: BLE warm and well-perfused Neuro: Intact motor and sensation of bilateral lower extremities       Assessment/Plan:  This is a 78 y.o. male who is here for  staple removal  -The patient recently underwent left groin washout with graftotomy primary repair and initiation of wound VAC therapy.  He was just recently transitioned to wet-to-dry dressings -His left groin wound is almost completely healed at today's visit.  There are no signs of infection. -I have removed all of the staples from his groin wound - He will continue wet-to-dry dressing changes daily.  Eventually his wound should get small enough to where he can transition to daily dry dressing changes. - He can follow-up with our office in 2 weeks for repeat wound check   Deneise Finlay, PA-C [facility] [phone]   Clinic MD: Vikki Graves

## 2023-09-22 DIAGNOSIS — K219 Gastro-esophageal reflux disease without esophagitis: Secondary | ICD-10-CM | POA: Diagnosis not present

## 2023-09-22 DIAGNOSIS — N39498 Other specified urinary incontinence: Secondary | ICD-10-CM | POA: Diagnosis not present

## 2023-09-22 DIAGNOSIS — E7800 Pure hypercholesterolemia, unspecified: Secondary | ICD-10-CM | POA: Diagnosis not present

## 2023-09-22 DIAGNOSIS — M12811 Other specific arthropathies, not elsewhere classified, right shoulder: Secondary | ICD-10-CM | POA: Diagnosis not present

## 2023-09-22 DIAGNOSIS — I130 Hypertensive heart and chronic kidney disease with heart failure and stage 1 through stage 4 chronic kidney disease, or unspecified chronic kidney disease: Secondary | ICD-10-CM | POA: Diagnosis not present

## 2023-09-22 DIAGNOSIS — Z7982 Long term (current) use of aspirin: Secondary | ICD-10-CM | POA: Diagnosis not present

## 2023-09-22 DIAGNOSIS — Z794 Long term (current) use of insulin: Secondary | ICD-10-CM | POA: Diagnosis not present

## 2023-09-22 DIAGNOSIS — I5042 Chronic combined systolic (congestive) and diastolic (congestive) heart failure: Secondary | ICD-10-CM | POA: Diagnosis not present

## 2023-09-22 DIAGNOSIS — T827XXA Infection and inflammatory reaction due to other cardiac and vascular devices, implants and grafts, initial encounter: Secondary | ICD-10-CM | POA: Diagnosis not present

## 2023-09-22 DIAGNOSIS — I480 Paroxysmal atrial fibrillation: Secondary | ICD-10-CM | POA: Diagnosis not present

## 2023-09-22 DIAGNOSIS — I2510 Atherosclerotic heart disease of native coronary artery without angina pectoris: Secondary | ICD-10-CM | POA: Diagnosis not present

## 2023-09-22 DIAGNOSIS — Z452 Encounter for adjustment and management of vascular access device: Secondary | ICD-10-CM | POA: Diagnosis not present

## 2023-09-22 DIAGNOSIS — E1151 Type 2 diabetes mellitus with diabetic peripheral angiopathy without gangrene: Secondary | ICD-10-CM | POA: Diagnosis not present

## 2023-09-22 DIAGNOSIS — N401 Enlarged prostate with lower urinary tract symptoms: Secondary | ICD-10-CM | POA: Diagnosis not present

## 2023-09-22 DIAGNOSIS — I255 Ischemic cardiomyopathy: Secondary | ICD-10-CM | POA: Diagnosis not present

## 2023-09-22 DIAGNOSIS — E039 Hypothyroidism, unspecified: Secondary | ICD-10-CM | POA: Diagnosis not present

## 2023-09-22 DIAGNOSIS — M48061 Spinal stenosis, lumbar region without neurogenic claudication: Secondary | ICD-10-CM | POA: Diagnosis not present

## 2023-09-22 DIAGNOSIS — I252 Old myocardial infarction: Secondary | ICD-10-CM | POA: Diagnosis not present

## 2023-09-22 DIAGNOSIS — K5732 Diverticulitis of large intestine without perforation or abscess without bleeding: Secondary | ICD-10-CM | POA: Diagnosis not present

## 2023-09-22 DIAGNOSIS — N1832 Chronic kidney disease, stage 3b: Secondary | ICD-10-CM | POA: Diagnosis not present

## 2023-09-22 DIAGNOSIS — Z792 Long term (current) use of antibiotics: Secondary | ICD-10-CM | POA: Diagnosis not present

## 2023-09-22 DIAGNOSIS — I4729 Other ventricular tachycardia: Secondary | ICD-10-CM | POA: Diagnosis not present

## 2023-09-22 DIAGNOSIS — I70219 Atherosclerosis of native arteries of extremities with intermittent claudication, unspecified extremity: Secondary | ICD-10-CM | POA: Diagnosis not present

## 2023-09-22 DIAGNOSIS — R159 Full incontinence of feces: Secondary | ICD-10-CM | POA: Diagnosis not present

## 2023-09-25 DIAGNOSIS — N39498 Other specified urinary incontinence: Secondary | ICD-10-CM | POA: Diagnosis not present

## 2023-09-25 DIAGNOSIS — M12811 Other specific arthropathies, not elsewhere classified, right shoulder: Secondary | ICD-10-CM | POA: Diagnosis not present

## 2023-09-25 DIAGNOSIS — M48061 Spinal stenosis, lumbar region without neurogenic claudication: Secondary | ICD-10-CM | POA: Diagnosis not present

## 2023-09-25 DIAGNOSIS — K5732 Diverticulitis of large intestine without perforation or abscess without bleeding: Secondary | ICD-10-CM | POA: Diagnosis not present

## 2023-09-25 DIAGNOSIS — I4729 Other ventricular tachycardia: Secondary | ICD-10-CM | POA: Diagnosis not present

## 2023-09-25 DIAGNOSIS — I480 Paroxysmal atrial fibrillation: Secondary | ICD-10-CM | POA: Diagnosis not present

## 2023-09-25 DIAGNOSIS — I70219 Atherosclerosis of native arteries of extremities with intermittent claudication, unspecified extremity: Secondary | ICD-10-CM | POA: Diagnosis not present

## 2023-09-25 DIAGNOSIS — I5042 Chronic combined systolic (congestive) and diastolic (congestive) heart failure: Secondary | ICD-10-CM | POA: Diagnosis not present

## 2023-09-25 DIAGNOSIS — I130 Hypertensive heart and chronic kidney disease with heart failure and stage 1 through stage 4 chronic kidney disease, or unspecified chronic kidney disease: Secondary | ICD-10-CM | POA: Diagnosis not present

## 2023-09-25 DIAGNOSIS — T827XXA Infection and inflammatory reaction due to other cardiac and vascular devices, implants and grafts, initial encounter: Secondary | ICD-10-CM | POA: Diagnosis not present

## 2023-09-25 DIAGNOSIS — N1832 Chronic kidney disease, stage 3b: Secondary | ICD-10-CM | POA: Diagnosis not present

## 2023-09-25 DIAGNOSIS — Z794 Long term (current) use of insulin: Secondary | ICD-10-CM | POA: Diagnosis not present

## 2023-09-25 DIAGNOSIS — K219 Gastro-esophageal reflux disease without esophagitis: Secondary | ICD-10-CM | POA: Diagnosis not present

## 2023-09-25 DIAGNOSIS — E7800 Pure hypercholesterolemia, unspecified: Secondary | ICD-10-CM | POA: Diagnosis not present

## 2023-09-25 DIAGNOSIS — I2510 Atherosclerotic heart disease of native coronary artery without angina pectoris: Secondary | ICD-10-CM | POA: Diagnosis not present

## 2023-09-25 DIAGNOSIS — Z452 Encounter for adjustment and management of vascular access device: Secondary | ICD-10-CM | POA: Diagnosis not present

## 2023-09-25 DIAGNOSIS — I255 Ischemic cardiomyopathy: Secondary | ICD-10-CM | POA: Diagnosis not present

## 2023-09-25 DIAGNOSIS — E1151 Type 2 diabetes mellitus with diabetic peripheral angiopathy without gangrene: Secondary | ICD-10-CM | POA: Diagnosis not present

## 2023-09-25 DIAGNOSIS — N401 Enlarged prostate with lower urinary tract symptoms: Secondary | ICD-10-CM | POA: Diagnosis not present

## 2023-09-25 DIAGNOSIS — E039 Hypothyroidism, unspecified: Secondary | ICD-10-CM | POA: Diagnosis not present

## 2023-09-25 DIAGNOSIS — Z792 Long term (current) use of antibiotics: Secondary | ICD-10-CM | POA: Diagnosis not present

## 2023-09-25 DIAGNOSIS — I252 Old myocardial infarction: Secondary | ICD-10-CM | POA: Diagnosis not present

## 2023-09-25 DIAGNOSIS — Z7982 Long term (current) use of aspirin: Secondary | ICD-10-CM | POA: Diagnosis not present

## 2023-09-25 DIAGNOSIS — R159 Full incontinence of feces: Secondary | ICD-10-CM | POA: Diagnosis not present

## 2023-09-27 ENCOUNTER — Encounter: Payer: Self-pay | Admitting: Internal Medicine

## 2023-09-27 ENCOUNTER — Ambulatory Visit: Admitting: Internal Medicine

## 2023-09-27 VITALS — BP 140/80 | HR 89 | Temp 98.2°F | Resp 18 | Ht 67.0 in | Wt 177.0 lb

## 2023-09-27 DIAGNOSIS — T827XXD Infection and inflammatory reaction due to other cardiac and vascular devices, implants and grafts, subsequent encounter: Secondary | ICD-10-CM | POA: Diagnosis not present

## 2023-09-27 DIAGNOSIS — I480 Paroxysmal atrial fibrillation: Secondary | ICD-10-CM

## 2023-09-27 DIAGNOSIS — I739 Peripheral vascular disease, unspecified: Secondary | ICD-10-CM

## 2023-09-27 DIAGNOSIS — E1159 Type 2 diabetes mellitus with other circulatory complications: Secondary | ICD-10-CM | POA: Diagnosis not present

## 2023-09-27 DIAGNOSIS — I255 Ischemic cardiomyopathy: Secondary | ICD-10-CM

## 2023-09-27 DIAGNOSIS — E1165 Type 2 diabetes mellitus with hyperglycemia: Secondary | ICD-10-CM | POA: Insufficient documentation

## 2023-09-27 DIAGNOSIS — E7800 Pure hypercholesterolemia, unspecified: Secondary | ICD-10-CM

## 2023-09-27 DIAGNOSIS — I5042 Chronic combined systolic (congestive) and diastolic (congestive) heart failure: Secondary | ICD-10-CM | POA: Diagnosis not present

## 2023-09-27 DIAGNOSIS — I25118 Atherosclerotic heart disease of native coronary artery with other forms of angina pectoris: Secondary | ICD-10-CM | POA: Diagnosis not present

## 2023-09-27 NOTE — Assessment & Plan Note (Signed)
 We will continue risk factor modification and we need to control his diabetes.  He has seen cardiology and he remains on a statin.  His LDL needs to be <55.  We will continue his ASA.  He is being setup for a PET CT by cardiology.

## 2023-09-27 NOTE — Assessment & Plan Note (Signed)
 We will continue risk factor modificaton.  I have reivewed his hospital notes and notes from vascular surgery.  Contionue on ASA and xarelto .

## 2023-09-27 NOTE — Assessment & Plan Note (Signed)
 We will check a FLP with goal LDL <55.

## 2023-09-27 NOTE — Progress Notes (Signed)
 Office Visit  Subjective   Patient ID: [name]   DOB: 11/10/1945   Age: 78 y.o.   MRN: [id number]   Chief Complaint Chief Complaint  Patient presents with   Follow-up   Coronary Artery Disease    2 month follow     History of Present Illness The patient is a 78 year old Caucasian/White male who returns for a follow-up visit for his T2 diabetes.  On his last visit, his diabetes was not controlled and I asked him to increase his ozempic  to 2mg  once a week.  We instead started him on lantus  instead of ozempic .  Again, he was diagnosed with T2 diabetes in the 1990's.  He remains on Jardiance  10mg  daily, glipizide  10mg  BID and Lantus  40 Units subcut night.  He is not walking as much as they would like. He specifically denies unexplained fatigue, palpitations, unexplained abdominal pain, nausea or vomiting or hypoglycemia. He does check blood sugars daily where they range from 120-200.  He came in fasting today in anticipation of lab work. He stopped metformin in the past due to diarrhea.  His last HgBa1c was done 3 months ago and was 10.6%.  He has no complications of diabetic retinopathy, neuropathy, or nephropathy.  He does have PVD and CAD associated with his diabetes.   Again, he did miss his yearly diabetic eye exam this past year and has an appointment next month.    The patient also returns today for routine followup on his cholesterol. This past year, his cholesterol was not fully controlled and about 3 months ago, we increased his crestor  from 20mg  to 40mg  daily.  He saw cardiology on 03/01/2023 and noted that his recent yearly labs showed his LDL was still not controlled with his history of CAD and diabetes.  He started him on zetia  10mg  daily at that time.  Overall, he states he is doing well and is without any complaints or problems at this time. He specifically denies abdominal pain, nausea, vomiting, diarrhea, myalgias, and fatigue.  He remains on dietary management as well as  crestor  40mg  at bedtime and zetia  10mg  daily. He is fasting in anticipation for labs today.  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  He was previously admitted to [facility] in 06/2023 due to acute lower limb ischemia secondary to acute thrombosis of the mid SFA.  He was noted to have chronic occlusion of the right profunda.  He uunderwent right lower extremity angiogram with right SFA and AT mechanical thrombectomy and right SFA drug-coated balloon angioplasty on 07/10/2023 by Dr. Susi Nguyen.   He was seen in the ER on 08/21/2023 for severe groin pain.  CTA demonstrated some stranding around his left groin suggestive of possible inflammation, infection, etc.  He was sent home and followed up with vascular surgery in their office on 08/25/2023 where a duplex showed fluid around proximal portion of the left to right femorofemoral bypass graft that was not seen on CT. This was concerning for graft infection so he was directly admitted to [facility] on 08/25/2023 for IV antibiotics and observation.  On 08/26/23 , he was taken to the OR and underwent washout of left groin wound with application of vancomycin /gentamicin  antibiotic beads and partial closure of left groin wound with wound VAC placement, and primary repair of graftotomy by Dr. Vikki Nguyen. Entresto  and Jardiance  held  due to AKI.  He was taken back to the OR on 5/13 for washout left groin wound.  Cultures with MSSA so IV vancomycin  and Zosyn  discontinued and switched to IV Ancef  based on sensitivities.  His AKI continued to improve. Infectious Disease recommended PICC line placement with 6 weeks of cefazolin  IV then transition to antibiotics for 4.5 months. Rifampin 300 mg BID added to cefazolin .  Home health was following his wound VAC (this was removed last week) and home health RN for wound VAC changes and IV medication management were arranged. He did  followup with vascular surgery on 09/14/2023 and they felt he was healing and removed his wound vac.  They want to see him back in 1 week.  Today, there is no fevers, chills, increased pain and he states he has good circulation to his feet.    The patient returns for followup of his coronary artery disease.  Again, he was admitted to [facility] from 08/11/2023 until 08/13/2023 due to chest tightness and SOB with exertion.  The patient had sudden onset of SOB and chest tightness with sweating and feeling cold/clammy while he was doing house chores and climbing up and down.  He sat down to rest but could not catch his breath.  An EKG done in the ER showed no acute ST/T wave changes and his CXR showed no acute process.  He was admitted to the hospital where they trended his troponin which did go into the gray zone.  Cardiology was consulted and an ECHO was done which showed moderate to severely decreased LV systolic function with EF of 30-35%.  His LV chamber sized was mildly dilated.  There was wall motion abnormality noted with akinetic inferior and inferiorseptal segment and hypokinetic rest of segment.  Cardiology started him on isosorbide  dinitrate 10mg  BID and followup with them for an outpatient stress test.  He did see cardiology on 08/17/2023 where they are setting him up for a PET CT.  He has moderately severe CAD with history of MI in 1992 with PTCA of RCA.  He had an inferior wall MI in 2000 that was treated with PCI to LAD.  He also had an inferior MI in 2006 with DES to RCA and developed in-stent restenosis with repeat stenting in 2006.  He had a myocardial perfusion scan done on 09/2017 that showed an LVEF of 29%.  There was a large defect of severe severity present in the basal inferoseptal, basal inferior, mid inferoseptal, mid inferior, apical anterior, apical septal, apical inferior, apical lateral and apex location.  This was a high risk study and there were findings  consistent with prior myocardial  infarction. It was a high risk study due to a very large old scar and severely reduced left ventricular systolic function. No reversible ischemia was seen. A LHC was performed 2019 that showed severe stenosis of proximal LAD that was treated with DES and RCA was found to be chronically occluded with left-to-right collaterals.  His last visit with cardiology was in 11/29/2022 where they did a preoperative workup.  He had a myocardial perfusion imaginging done on 10/10/2022 that showed prior infarction but no current ischemia.  He had an ECHO done on 09/23/2022 that showed EF 25 to 30%, severely decreased function, global hypokinesis, grade 1 DD, mildly elevated PASP, mild MR, mild calcification aortic valve without stenosis .  See PMH for summary of cardiac history and status of revascularization. His CAD is controlled with therapy as summarized in the medication list  and previous notes.  He has the following baseline symptoms: none.  He states he can go up 3-4 flight of stairs before he gets SOB.Aaron Aas He has the following modifiable risk factor(s): HTN, DM, hyperlipidemia, and sedentary lifestyle. Specifically denied complaint(s): chest pain, palpitations, orthopnea, edema, exertional dyspnea, and syncope.     This patient also has a history of paroxysmal Atrial Fibrilattion which was discovered when he went to see cardiology for preoperative clearance on 09/06/2022.  They noted at that time that his A. Fib was maintaining in sinus rhythm.  He presents today for a status visit. Her atrial fibrillation is controlled with therapy as summarized in the medication list and previous notes. He has a CHA2DS2-VASc score of 6 and remains on xarelto  20mg  daily and ASA 81mg  daily.  He remains on coreg  12.5mg  BID.  His last ECHO was done on 05/23/2023 and this showed a mildly decreased LV function with a LVEF of 45-50%.  There was mild concentric LVH.  His left ventricular diastolic parameters were normal. His right ventricular systolic  function is normal. The mitral valve was degenerative. No evidence of mitral valve regurgitation. Specifically denied complaints: palpitations.  He saw cardiology on 08/17/2023 and they felt he was in sinus rhythm at that time.    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 The etiology of the CHF is secondary to ischemic cardiomyopathy related to his CAD as described above.  His CHF has been in a compensated state on medications as noted in the medication list. He has the following baseline symptoms: none. Specifically denied complaints: orthopnea, PND, edema, worsening orthopnea, worsening edema, and worsening exertional dyspnea.  A prior ECHO was done in 02/28/2022 that showed a LVEF 60-65%. Left ventricular ejection fraction by PLAX is 25 %. The left ventricle has severely decreased function. The left ventricle demonstrates global hypokinesis. The left ventricular internal cavity size was mildly dilated. Left ventricular diastolic parameters are consistent with Grade II diastolic dysfunction (pseudonormalization).  His right ventricular systolic function is mildly reduced. The right ventricular size is normal. There was normal pulmonary artery systolic pressure.  3. Left atrial size was mildly dilated.  The mitral valve is degenerative. Mild mitral valve regurgitation. No evidence of mitral stenosis.  Cardiology saw him on 06/21/2022 where they increased his dose of entresto  to 49/51mg  BID.  They discussed possible referral to EP for discussion of possible ICD.  When he saw cardiology on 09/06/2022 they wanted to repeat his ECHO to check his EF and go from there to see if he needs ICD placement.  He had an ECHO done on 09/23/2022 that showed his EF was 25-30%. The left ventricle has severely decreased function. The left ventricle demonstrates global hypokinesis. Left ventricular diastolic parameters are consistent with Grade I  diastolic dysfunction (impaired relaxation). Elevated left ventricular end-diastolic pressure.  The right ventricular systolic function is normal. The right ventricular size is normal. There is mildly elevated pulmonary artery systolic pressure.  His left atrial size was severely dilated.  is last ECHO was done on 05/23/2023 and this showed a mildly decreased LV function with a LVEF of 45-50%.  There was mild concentric LVH.  His left ventricular diastolic parameters were normal. His right ventricular systolic function is normal. The mitral valve was degenerative. No evidence of mitral valve regurgitation.  Again, he saw cardiology on 08/17/2023 and he was noted to have NYHA class I-II.  He was euvolemic and continues on Coreg  12.5 mg twice daily, Jardiance  10 mg daily, Entresto  49-51 mg twice daily cannot be on MRA secondary to hyperkalemia.  His EF was noted and they stated he does not want ICD.       Past Medical History Past Medical History:  Diagnosis Date   Acute low back pain 01/12/2015   Acute lower limb ischemia 07/11/2023   Acute pain of both shoulders 07/22/2022   AKI (acute kidney injury) (HCC) 05/24/2022   Arthritis    Atherosclerosis of native arteries of the extremities with intermittent claudication 08/13/2013   Atherosclerosis of native artery of extremity with intermittent claudication (HCC) 08/13/2013   IMO SNOMED Dx Update Oct 2024     Backache 02/08/2013   Benign prostatic hyperplasia with urinary frequency 02/23/2023   Bladder cancer (HCC)    resection x3   BMI 27.0-27.9,adult 02/23/2023   Cellulitis 07/17/2023   Chronic combined systolic (congestive) and diastolic (congestive) heart failure (HCC) 07/22/2022   Chronic left shoulder pain 05/24/2023   Chronic right shoulder pain 09/02/2022   Chronic systolic CHF (congestive heart failure) (HCC)    CKD stage 3b, GFR 30-44 ml/min (HCC) 07/03/2023   Coronary artery disease    status post DMI RX Taxus stent RCA 2006 with  susequent Stent LAD and subsequent  stent thrombosis RCA unable to be opened 2006 -neg mv 10/2008, 10/16/17 ISR to pLAD with PTCA/DES, CTO of RCA with collaterals, EF 25%   Coronary artery disease involving native coronary artery of native heart with unstable angina pectoris (HCC)    Current moderate episode of major depressive disorder without prior episode (HCC) 03/28/2022   Diverticulitis 06/10/2022   Diverticulitis large intestine 05/11/2021   Diverticulitis of large intestine with abscess 05/24/2022   Diverticulitis of sigmoid colon 05/11/2021   DM2 (diabetes mellitus, type 2) (HCC) 02/09/2022   status post bilateral aortobifemoral bypass [id number] with recent fem to fembypass April  2011 per DR. Early     Dyssynergic defecation 03/21/2023   Essential hypertension 07/22/2022   Fatigue 03/13/2023   History of bladder cancer 02/23/2023   History of colonic diverticulitis 03/21/2023   Hypercholesterolemia 10/28/2022   HYPERLIPIDEMIA-MIXED 09/22/2008   Qualifier: Diagnosis of  By: Malon Seamen     Hypothyroidism 05/24/2022   Ischemic cardiomyopathy    ejection fraction of 40-45%   Lethargy 03/28/2022   Lumbar spinal stenosis    Myocardial infarction (HCC)    I've had 4 (10/16/2017)   Need for prophylactic vaccination and inoculation against influenza 03/28/2022   NSVT (nonsustained ventricular tachycardia) (HCC)    PAD (peripheral artery disease) (HCC) 07/17/2023   PAF (paroxysmal atrial fibrillation) (HCC) 02/09/2022   Paroxysmal atrial fibrillation (HCC)    Peripheral artery disease (HCC) 07/09/2023   Poorly controlled T2 diabetes mellitus (HCC) 09/27/2022   Protein-calorie malnutrition, severe 05/29/2022   PVC's (premature ventricular contractions)    PVD 09/22/2008   Qualifier: Diagnosis of  By: Malon Seamen     PVD (peripheral vascular disease) (HCC) 02/23/2023   Rotator cuff arthropathy of right shoulder 09/27/2022   Sigmoid diverticulitis 06/30/2023   Status post coronary  artery stent placement    Status post Hartmann's procedure (HCC) 03/21/2023   TOBACCO ABUSE 05/29/2009   Qualifier: Diagnosis of  By: Emma Hardy, RN, BSN, Levern Reader    Trigger ring finger of right hand 03/29/2023   Type 2 diabetes mellitus with other circulatory complications (HCC) 07/22/2022   Type II diabetes mellitus (HCC)    Unstable  angina (HCC) 10/16/2017     Allergies Allergies  Allergen Reactions   Lisinopril  Swelling and Rash    Rash - face and tounge swell   Levaquin [Levofloxacin]      Medications  Current Outpatient Medications:    amLODipine  (NORVASC ) 5 MG tablet, Take 1 tablet (5 mg total) by mouth daily., Disp: 90 tablet, Rfl: 3   aspirin  EC 81 MG tablet, Take 1 tablet (81 mg total) by mouth daily. Swallow whole., Disp: 90 tablet, Rfl: 3   carvedilol  (COREG ) 12.5 MG tablet, Take 1 tablet (12.5 mg total) by mouth 2 (two) times daily with a meal., Disp: 180 tablet, Rfl: 3   ceFAZolin  (ANCEF ) IVPB, Inject 2 g into the vein every 8 (eight) hours. Indication:  MSSA vascular graft infection First Dose: Yes Last Day of Therapy:  10/10/2023 Labs - Once weekly:  CBC/D and BMP, Labs - Once weekly: ESR and CRP Method of administration: IV Push Method of administration may be changed at the discretion of home infusion pharmacist based upon assessment of the patient and/or caregiver's ability to self-administer the medication ordered., Disp: 120 Units, Rfl: 0   cetirizine  (ZYRTEC ) 10 MG tablet, Take 1 tablet (10 mg total) by mouth daily., Disp: 90 tablet, Rfl: 0   cyclobenzaprine  (FLEXERIL ) 10 MG tablet, Take 1 tablet (10 mg total) by mouth 3 (three) times daily as needed for muscle spasms., Disp: 15 tablet, Rfl: 0   ezetimibe  (ZETIA ) 10 MG tablet, Take 1 tablet (10 mg total) by mouth daily., Disp: 90 tablet, Rfl: 3   insulin  aspart (NOVOLOG ) 100 UNIT/ML FlexPen, Inject 12 Units into the skin 3 (three) times daily with meals., Disp: 15 mL, Rfl: 11   insulin  glargine (LANTUS ) 100 unit/mL  SOPN, 40 units subcut daily at nighttime, Disp: , Rfl:    isosorbide  dinitrate (ISORDIL ) 10 MG tablet, Take 1 tablet (10 mg total) by mouth 2 (two) times daily., Disp: 180 tablet, Rfl: 3   JARDIANCE  10 MG TABS tablet, Take 1 tablet (10 mg total) by mouth daily., Disp: 30 tablet, Rfl: 2   loperamide  (IMODIUM  A-D) 2 MG tablet, Take 2 mg by mouth 4 (four) times daily as needed for diarrhea or loose stools., Disp: , Rfl:    nitroGLYCERIN  (NITROSTAT ) 0.4 MG SL tablet, Place 0.4 mg under the tongue every 5 (five) minutes as needed for chest pain., Disp: , Rfl:    oxyCODONE -acetaminophen  (PERCOCET) 5-325 MG tablet, Take 1 tablet by mouth every 6 (six) hours as needed for severe pain (pain score 7-10)., Disp: 20 tablet, Rfl: 0   rivaroxaban  (XARELTO ) 20 MG TABS tablet, Take 1 tablet (20 mg total) by mouth daily with supper., Disp: 30 tablet, Rfl: 3   rosuvastatin  (CRESTOR ) 40 MG tablet, TAKE 1 TABLET(40 MG) BY MOUTH DAILY, Disp: 90 tablet, Rfl: 1   tamsulosin  (FLOMAX ) 0.4 MG CAPS capsule, Take 0.8 mg by mouth every evening., Disp: , Rfl:    Review of Systems Review of Systems  Constitutional:  Negative for chills, fever and malaise/fatigue.  Eyes:  Negative for blurred vision and double vision.  Respiratory:  Negative for cough, hemoptysis and shortness of breath.   Cardiovascular:  Negative for chest pain, palpitations, orthopnea and leg swelling.  Gastrointestinal:  Negative for abdominal pain, blood in stool, constipation, diarrhea, melena, nausea and vomiting.  Genitourinary:  Negative for frequency and hematuria.  Musculoskeletal:  Negative for myalgias.  Skin:  Negative for itching and rash.  Neurological:  Negative for dizziness, weakness and headaches.  Endo/Heme/Allergies:  Positive for polydipsia.       Objective:    Vitals BP (!) 140/80 (BP Location: Left Arm, Patient Position: Sitting, Cuff Size: Normal)   Pulse 89   Temp 98.2 F (36.8 C)   Resp 18   Ht 5' 7 (1.702 m)   Wt 177  lb (80.3 kg)   SpO2 97%   BMI 27.72 kg/m    Physical Examination Physical Exam Constitutional:      Appearance: Normal appearance. He is not ill-appearing.  Neck:     Vascular: No carotid bruit.  Cardiovascular:     Rate and Rhythm: Normal rate and regular rhythm.     Pulses: Normal pulses.     Heart sounds: No murmur heard.    No friction rub. No gallop.  Pulmonary:     Effort: Pulmonary effort is normal. No respiratory distress.     Breath sounds: No wheezing, rhonchi or rales.  Abdominal:     General: Abdomen is flat. Bowel sounds are normal. There is no distension.     Palpations: Abdomen is soft.     Tenderness: There is no abdominal tenderness.  Musculoskeletal:     Right lower leg: No edema.     Left lower leg: No edema.  Skin:    General: Skin is warm and dry.     Findings: No rash.  Neurological:     General: No focal deficit present.     Mental Status: He is alert and oriented to person, place, and time.  Psychiatric:        Mood and Affect: Mood normal.        Behavior: Behavior normal.        Assessment & Plan:   Chronic combined systolic (congestive) and diastolic (congestive) heart failure (HCC) He shows no signs of volume overload.  He remains on entresto  and his BP is doing well.  He is not on a diuretic at this time.  Coronary artery disease We will continue risk factor modification and we need to control his diabetes.  He has seen cardiology and he remains on a statin.  His LDL needs to be <55.  We will continue his ASA.  He is being setup for a PET CT by cardiology.  Infection of vascular bypass graft [facility]) He seems to be doing well and is followed by vascular surgery.  His wound vac is now off.  He has a PICC line in place receiving IV antibiotics.  I will check a CBC on him today.  He states he goes to see ID in about 2 weeks.  Ischemic cardiomyopathy Plan as above.  PAF (paroxysmal atrial fibrillation) (HCC) His A. Fib seems to be controlled  and he is in sinus rhythm with my exam today.  His CHAD2VASC score is a 6 and he remains on xarelto  at this time.  Type 2 diabetes mellitus with other circulatory complications (HCC) Plan as below.  Poorly controlled diabetes mellitus (HCC) He is not taking the novolog  and it seems he was discharged from the hospital on this.  We will check his HgBA1c today.  Hypercholesterolemia We will check a FLP with goal LDL <55.    PAD (peripheral artery disease) (HCC) We will continue risk factor modificaton.  I have reivewed his hospital notes and notes from vascular surgery.  Contionue on ASA and xarelto .    Return in about 3 months (around 12/28/2023).   Wayne Haines, MD

## 2023-09-27 NOTE — Assessment & Plan Note (Signed)
 He shows no signs of volume overload.  He remains on entresto  and his BP is doing well.  He is not on a diuretic at this time.

## 2023-09-27 NOTE — Assessment & Plan Note (Signed)
 He is not taking the novolog  and it seems he was discharged from the hospital on this.  We will check his HgBA1c today.

## 2023-09-27 NOTE — Assessment & Plan Note (Signed)
 He seems to be doing well and is followed by vascular surgery.  His wound vac is now off.  He has a PICC line in place receiving IV antibiotics.  I will check a CBC on him today.  He states he goes to see ID in about 2 weeks.

## 2023-09-27 NOTE — Assessment & Plan Note (Signed)
 Plan as above.

## 2023-09-27 NOTE — Assessment & Plan Note (Signed)
 His A. Fib seems to be controlled and he is in sinus rhythm with my exam today.  His CHAD2VASC score is a 6 and he remains on xarelto  at this time.

## 2023-09-27 NOTE — Assessment & Plan Note (Signed)
 Plan as below.

## 2023-09-28 LAB — LIPID PANEL
Chol/HDL Ratio: 2.9 ratio (ref 0.0–5.0)
Cholesterol, Total: 99 mg/dL — ABNORMAL LOW (ref 100–199)
HDL: 34 mg/dL — ABNORMAL LOW (ref >39)
LDL Chol Calc (NIH): 44 mg/dL (ref 0–99)
Triglycerides: 112 mg/dL (ref 0–149)
VLDL Cholesterol Cal: 21 mg/dL (ref 5–40)

## 2023-09-28 LAB — CBC WITH DIFFERENTIAL/PLATELET
Basophils Absolute: 0 10*3/uL (ref 0.0–0.2)
Basos: 0 %
EOS (ABSOLUTE): 0.2 10*3/uL (ref 0.0–0.4)
Eos: 2 %
Hematocrit: 37 % — ABNORMAL LOW (ref 37.5–51.0)
Hemoglobin: 11.4 g/dL — ABNORMAL LOW (ref 13.0–17.7)
Immature Grans (Abs): 0 10*3/uL (ref 0.0–0.1)
Immature Granulocytes: 0 %
Lymphocytes Absolute: 1.6 10*3/uL (ref 0.7–3.1)
Lymphs: 18 %
MCH: 27 pg (ref 26.6–33.0)
MCHC: 30.8 g/dL — ABNORMAL LOW (ref 31.5–35.7)
MCV: 88 fL (ref 79–97)
Monocytes Absolute: 0.7 10*3/uL (ref 0.1–0.9)
Monocytes: 7 %
Neutrophils Absolute: 6.5 10*3/uL (ref 1.4–7.0)
Neutrophils: 73 %
Platelets: 279 10*3/uL (ref 150–450)
RBC: 4.23 x10E6/uL (ref 4.14–5.80)
RDW: 14.4 % (ref 11.6–15.4)
WBC: 8.9 10*3/uL (ref 3.4–10.8)

## 2023-09-28 LAB — CMP14 + ANION GAP
ALT: <5 IU/L (ref 0–44)
AST: 18 IU/L (ref 0–40)
Albumin: 4.2 g/dL (ref 3.8–4.8)
Alkaline Phosphatase: 87 IU/L (ref 44–121)
Anion Gap: 15 mmol/L (ref 10.0–18.0)
BUN/Creatinine Ratio: 11 (ref 10–24)
BUN: 13 mg/dL (ref 8–27)
Bilirubin Total: 0.3 mg/dL (ref 0.0–1.2)
CO2: 21 mmol/L (ref 20–29)
Calcium: 9.5 mg/dL (ref 8.6–10.2)
Chloride: 102 mmol/L (ref 96–106)
Creatinine, Ser: 1.23 mg/dL (ref 0.76–1.27)
Globulin, Total: 3 g/dL (ref 1.5–4.5)
Glucose: 225 mg/dL — ABNORMAL HIGH (ref 70–99)
Potassium: 4.7 mmol/L (ref 3.5–5.2)
Sodium: 138 mmol/L (ref 134–144)
Total Protein: 7.2 g/dL (ref 6.0–8.5)
eGFR: 60 mL/min/1.73

## 2023-09-28 LAB — HEMOGLOBIN A1C
Est. average glucose Bld gHb Est-mCnc: 214 mg/dL
Hgb A1c MFr Bld: 9.1 % — ABNORMAL HIGH (ref 4.8–5.6)

## 2023-09-29 ENCOUNTER — Ambulatory Visit: Admitting: Internal Medicine

## 2023-09-29 DIAGNOSIS — T827XXA Infection and inflammatory reaction due to other cardiac and vascular devices, implants and grafts, initial encounter: Secondary | ICD-10-CM | POA: Diagnosis not present

## 2023-10-02 DIAGNOSIS — Z792 Long term (current) use of antibiotics: Secondary | ICD-10-CM | POA: Diagnosis not present

## 2023-10-02 DIAGNOSIS — K5732 Diverticulitis of large intestine without perforation or abscess without bleeding: Secondary | ICD-10-CM | POA: Diagnosis not present

## 2023-10-02 DIAGNOSIS — E7800 Pure hypercholesterolemia, unspecified: Secondary | ICD-10-CM | POA: Diagnosis not present

## 2023-10-02 DIAGNOSIS — Z794 Long term (current) use of insulin: Secondary | ICD-10-CM | POA: Diagnosis not present

## 2023-10-02 DIAGNOSIS — N1832 Chronic kidney disease, stage 3b: Secondary | ICD-10-CM | POA: Diagnosis not present

## 2023-10-02 DIAGNOSIS — I130 Hypertensive heart and chronic kidney disease with heart failure and stage 1 through stage 4 chronic kidney disease, or unspecified chronic kidney disease: Secondary | ICD-10-CM | POA: Diagnosis not present

## 2023-10-02 DIAGNOSIS — Z7982 Long term (current) use of aspirin: Secondary | ICD-10-CM | POA: Diagnosis not present

## 2023-10-02 DIAGNOSIS — Z452 Encounter for adjustment and management of vascular access device: Secondary | ICD-10-CM | POA: Diagnosis not present

## 2023-10-02 DIAGNOSIS — N401 Enlarged prostate with lower urinary tract symptoms: Secondary | ICD-10-CM | POA: Diagnosis not present

## 2023-10-02 DIAGNOSIS — E1151 Type 2 diabetes mellitus with diabetic peripheral angiopathy without gangrene: Secondary | ICD-10-CM | POA: Diagnosis not present

## 2023-10-02 DIAGNOSIS — T827XXA Infection and inflammatory reaction due to other cardiac and vascular devices, implants and grafts, initial encounter: Secondary | ICD-10-CM | POA: Diagnosis not present

## 2023-10-02 DIAGNOSIS — M48061 Spinal stenosis, lumbar region without neurogenic claudication: Secondary | ICD-10-CM | POA: Diagnosis not present

## 2023-10-02 DIAGNOSIS — I255 Ischemic cardiomyopathy: Secondary | ICD-10-CM | POA: Diagnosis not present

## 2023-10-02 DIAGNOSIS — R159 Full incontinence of feces: Secondary | ICD-10-CM | POA: Diagnosis not present

## 2023-10-02 DIAGNOSIS — I70219 Atherosclerosis of native arteries of extremities with intermittent claudication, unspecified extremity: Secondary | ICD-10-CM | POA: Diagnosis not present

## 2023-10-02 DIAGNOSIS — M12811 Other specific arthropathies, not elsewhere classified, right shoulder: Secondary | ICD-10-CM | POA: Diagnosis not present

## 2023-10-02 DIAGNOSIS — K219 Gastro-esophageal reflux disease without esophagitis: Secondary | ICD-10-CM | POA: Diagnosis not present

## 2023-10-02 DIAGNOSIS — I252 Old myocardial infarction: Secondary | ICD-10-CM | POA: Diagnosis not present

## 2023-10-02 DIAGNOSIS — I5042 Chronic combined systolic (congestive) and diastolic (congestive) heart failure: Secondary | ICD-10-CM | POA: Diagnosis not present

## 2023-10-02 DIAGNOSIS — I2510 Atherosclerotic heart disease of native coronary artery without angina pectoris: Secondary | ICD-10-CM | POA: Diagnosis not present

## 2023-10-02 DIAGNOSIS — I480 Paroxysmal atrial fibrillation: Secondary | ICD-10-CM | POA: Diagnosis not present

## 2023-10-02 DIAGNOSIS — E039 Hypothyroidism, unspecified: Secondary | ICD-10-CM | POA: Diagnosis not present

## 2023-10-02 DIAGNOSIS — I4729 Other ventricular tachycardia: Secondary | ICD-10-CM | POA: Diagnosis not present

## 2023-10-02 DIAGNOSIS — N39498 Other specified urinary incontinence: Secondary | ICD-10-CM | POA: Diagnosis not present

## 2023-10-03 ENCOUNTER — Ambulatory Visit: Payer: Self-pay

## 2023-10-03 NOTE — Progress Notes (Signed)
 Patient called.  Left message for patient to call back.  I have called and left the patient a voicemail to return our phone call. The patient needs to be informed His labs look good but cholesterol is still not controlled.  Increase his lantus  to 46 Units daily. Aaron Aas

## 2023-10-04 ENCOUNTER — Ambulatory Visit: Attending: Vascular Surgery | Admitting: Physician Assistant

## 2023-10-04 VITALS — BP 123/86 | HR 76 | Temp 98.0°F | Ht 67.0 in | Wt 178.7 lb

## 2023-10-04 DIAGNOSIS — T827XXA Infection and inflammatory reaction due to other cardiac and vascular devices, implants and grafts, initial encounter: Secondary | ICD-10-CM

## 2023-10-04 NOTE — Progress Notes (Signed)
 POST OPERATIVE OFFICE NOTE    CC:  F/u for surgery  HPI:  This is a 78 y.o. male who is s/p Washout of left groin wound with 1 L saline and 1 L of Vashe using Pulsavac, application of vancomycin /gentamicin  antibiotic beads, application 38 cm Kerecis, partial closure of left groin wound with wound VAC placement totaling 4 x 2 cm, primary repair of graftotomy on 08/26/23 by Dr. Vikki Graves.  He returned to the OR on 08/29/2023 and underwent washout left groin, Kerecis placement and wound vac by Dr. Vikki Graves.   This was performed secondary to infected left to right femorofemoral bypass graft. He was discharged with PICC line and 6 weeks of IV antibiotics followed by oral antibiotics per ID. He has been receiving HH to change wound VAC 3x/week.   He was seen on 09/20/2023 and at that time, he was not having any drainage, tenderness or erythema. He was not having fever or chills and wound was near healed.  He was doing wet to dry dressing changes.   Pt returns today for follow up and here with his significant other.  Pt states he is doing well and incision has healed.  He does not have any claudication or rest pain.   He does not have any drainage, fever or chills.  He has appt tomorrow with ID.  He is still on IV abx.    Allergies  Allergen Reactions   Lisinopril  Swelling and Rash    Rash - face and tounge swell   Levaquin [Levofloxacin]     Current Outpatient Medications  Medication Sig Dispense Refill   amLODipine  (NORVASC ) 5 MG tablet Take 1 tablet (5 mg total) by mouth daily. 90 tablet 3   aspirin  EC 81 MG tablet Take 1 tablet (81 mg total) by mouth daily. Swallow whole. 90 tablet 3   carvedilol  (COREG ) 12.5 MG tablet Take 1 tablet (12.5 mg total) by mouth 2 (two) times daily with a meal. 180 tablet 3   ceFAZolin  (ANCEF ) IVPB Inject 2 g into the vein every 8 (eight) hours. Indication:  MSSA vascular graft infection First Dose: Yes Last Day of Therapy:  10/10/2023 Labs - Once weekly:  CBC/D and  BMP, Labs - Once weekly: ESR and CRP Method of administration: IV Push Method of administration may be changed at the discretion of home infusion pharmacist based upon assessment of the patient and/or caregiver's ability to self-administer the medication ordered. 120 Units 0   cetirizine  (ZYRTEC ) 10 MG tablet Take 1 tablet (10 mg total) by mouth daily. 90 tablet 0   cyclobenzaprine  (FLEXERIL ) 10 MG tablet Take 1 tablet (10 mg total) by mouth 3 (three) times daily as needed for muscle spasms. 15 tablet 0   ezetimibe  (ZETIA ) 10 MG tablet Take 1 tablet (10 mg total) by mouth daily. 90 tablet 3   insulin  aspart (NOVOLOG ) 100 UNIT/ML FlexPen Inject 12 Units into the skin 3 (three) times daily with meals. 15 mL 11   insulin  glargine (LANTUS ) 100 unit/mL SOPN 40 units subcut daily at nighttime     isosorbide  dinitrate (ISORDIL ) 10 MG tablet Take 1 tablet (10 mg total) by mouth 2 (two) times daily. 180 tablet 3   JARDIANCE  10 MG TABS tablet Take 1 tablet (10 mg total) by mouth daily. 30 tablet 2   loperamide  (IMODIUM  A-D) 2 MG tablet Take 2 mg by mouth 4 (four) times daily as needed for diarrhea or loose stools.     nitroGLYCERIN  (NITROSTAT ) 0.4 MG SL  tablet Place 0.4 mg under the tongue every 5 (five) minutes as needed for chest pain.     oxyCODONE -acetaminophen  (PERCOCET) 5-325 MG tablet Take 1 tablet by mouth every 6 (six) hours as needed for severe pain (pain score 7-10). 20 tablet 0   rivaroxaban  (XARELTO ) 20 MG TABS tablet Take 1 tablet (20 mg total) by mouth daily with supper. 30 tablet 3   rosuvastatin  (CRESTOR ) 40 MG tablet TAKE 1 TABLET(40 MG) BY MOUTH DAILY 90 tablet 1   tamsulosin  (FLOMAX ) 0.4 MG CAPS capsule Take 0.8 mg by mouth every evening.     No current facility-administered medications for this visit.     ROS:  See HPI  Physical Exam:  Today's Vitals   10/04/23 0959  BP: 123/86  Pulse: 76  Temp: 98 F (36.7 C)  TempSrc: Temporal  SpO2: 94%  Weight: 178 lb 11.2 oz (81.1 kg)   Height: 5' 7 (1.702 m)  PainSc: 0-No pain   Body mass index is 27.99 kg/m.   Incision:  left groin incision has healed. There is no drainage or erythema Extremities:  palpable DP pulses bilaterally     Assessment/Plan:  This is a 78 y.o. male who is s/p:  Washout of left groin wound with 1 L saline and 1 L of Vashe using Pulsavac, application of vancomycin /gentamicin  antibiotic beads, application 38 cm Kerecis, partial closure of left groin wound with wound VAC placement totaling 4 x 2 cm, primary repair of graftotomy on 08/26/23 by Dr. Vikki Graves.  He returned to the OR on 08/29/2023 and underwent washout left groin, Kerecis placement and wound vac by Dr. Vikki Graves.   This was performed secondary to infected left to right femorofemoral bypass graft. He was discharged with PICC line and 6 weeks of IV antibiotics followed by oral antibiotics per ID. He has been receiving HH to change wound VAC 3x/week.   -pt doing well.  He has palpable femoral pulses.  His incision has healed.   -f/u in 6-8 weeks with fem fem duplex, RLE arterial duplex and ABI.  He will see Dr. Vikki Graves at that time. -discussed continuing to follow infectious disease recommendations for abx.     Maryanna Smart, [facility] [phone]   Clinic MD:  Vikki Graves

## 2023-10-05 ENCOUNTER — Other Ambulatory Visit: Payer: Self-pay | Admitting: *Deleted

## 2023-10-05 ENCOUNTER — Encounter: Payer: Self-pay | Admitting: Internal Medicine

## 2023-10-05 ENCOUNTER — Other Ambulatory Visit: Payer: Self-pay

## 2023-10-05 ENCOUNTER — Ambulatory Visit: Admitting: Internal Medicine

## 2023-10-05 VITALS — BP 135/83 | HR 83 | Ht 67.0 in | Wt 178.0 lb

## 2023-10-05 DIAGNOSIS — T827XXD Infection and inflammatory reaction due to other cardiac and vascular devices, implants and grafts, subsequent encounter: Secondary | ICD-10-CM | POA: Diagnosis not present

## 2023-10-05 DIAGNOSIS — I70213 Atherosclerosis of native arteries of extremities with intermittent claudication, bilateral legs: Secondary | ICD-10-CM

## 2023-10-05 DIAGNOSIS — I739 Peripheral vascular disease, unspecified: Secondary | ICD-10-CM

## 2023-10-05 MED ORDER — CEFADROXIL 500 MG PO CAPS: 1000.0000 mg | ORAL_CAPSULE | Freq: Two times a day (BID) | ORAL | 5 refills | Status: DC

## 2023-10-05 MED ORDER — INSULIN GLARGINE 100 UNITS/ML SOLOSTAR PEN: PEN_INJECTOR | SUBCUTANEOUS | 3 refills | Status: DC

## 2023-10-05 NOTE — Patient Instructions (Signed)
 F/U oral cefadroxil 1gm bid on 6/25

## 2023-10-05 NOTE — Progress Notes (Unsigned)
 Patient: [name]  DOB: Nov 22, 1945 MRN: [id number] PCP: Wayne Haines, MD  Referring Provider: ***  Chief Complaint  Patient presents with  . Hospitalization Follow-up     Patient Active Problem List   Diagnosis Date Noted  . Poorly controlled diabetes mellitus (HCC) 09/27/2023  . Staphylococcus aureus infection 08/31/2023  . GERD (gastroesophageal reflux disease) 08/28/2023  . BPH (benign prostatic hyperplasia) 08/28/2023  . Infection of vascular bypass graft (HCC) 08/25/2023  . Arthritis   . PAD (peripheral artery disease) (HCC) 07/17/2023  . Cellulitis 07/17/2023  . Acute lower limb ischemia 07/11/2023  . Peripheral artery disease (HCC) 07/09/2023  . CKD stage 3b, GFR 30-44 ml/min (HCC) 07/03/2023  . Sigmoid diverticulitis 06/30/2023  . Chronic left shoulder pain 05/24/2023  . Trigger ring finger of right hand 03/29/2023  . Dyssynergic defecation 03/21/2023  . History of colonic diverticulitis 03/21/2023  . Status post Hartmann's procedure (HCC) 03/21/2023  . Fatigue 03/13/2023  . Benign prostatic hyperplasia with urinary frequency 02/23/2023  . PVD (peripheral vascular disease) (HCC) 02/23/2023  . History of bladder cancer 02/23/2023  . BMI 27.0-27.9,adult 02/23/2023  . Hypercholesterolemia 10/28/2022  . Rotator cuff arthropathy of right shoulder 09/27/2022  . Chronic right shoulder pain 09/02/2022  . Type 2 diabetes mellitus with other circulatory complications (HCC) 07/22/2022  . Paroxysmal atrial fibrillation (HCC) 07/22/2022  . Essential hypertension 07/22/2022  . Acute pain of both shoulders 07/22/2022  . Chronic systolic CHF (congestive heart failure) (HCC) 07/22/2022  . Chronic combined systolic (congestive) and diastolic (congestive) heart failure (HCC) 07/22/2022  . AKI (acute kidney injury) (HCC) 05/24/2022  . Hypothyroidism 05/24/2022  . Need for prophylactic vaccination and inoculation against influenza 03/28/2022  . Lethargy 03/28/2022  .  Bladder cancer (HCC) 02/09/2022  . Coronary artery disease 02/09/2022  . Ischemic cardiomyopathy 02/09/2022  . Lumbar spinal stenosis 02/09/2022  . Myocardial infarction (HCC) 02/09/2022  . NSVT (nonsustained ventricular tachycardia) (HCC) 02/09/2022  . PAF (paroxysmal atrial fibrillation) (HCC) 02/09/2022  . PVC's (premature ventricular contractions) 02/09/2022  . DM2 (diabetes mellitus, type 2) (HCC) 02/09/2022  . Diverticulitis large intestine s/p LAR/colostomy takedown 06/30/2023 05/11/2021  . Diverticulitis of sigmoid colon 05/11/2021  . Status post coronary artery stent placement   . Acute low back pain 01/12/2015  . Atherosclerosis of native artery of extremity with intermittent claudication (HCC) 08/13/2013  . Backache 02/08/2013  . TOBACCO ABUSE 05/29/2009  . HYPERLIPIDEMIA-MIXED 09/22/2008  . PVD 09/22/2008     Subjective:  [name] is a 78 y.o. @[name] @ with   ROS  Past Medical History:  Diagnosis Date  . Acute low back pain 01/12/2015  . Acute lower limb ischemia 07/11/2023  . Acute pain of both shoulders 07/22/2022  . AKI (acute kidney injury) (HCC) 05/24/2022  . Arthritis   . Atherosclerosis of native arteries of the extremities with intermittent claudication 08/13/2013  . Atherosclerosis of native artery of extremity with intermittent claudication (HCC) 08/13/2013   IMO SNOMED Dx Update Oct 2024    . Backache 02/08/2013  . Benign prostatic hyperplasia with urinary frequency 02/23/2023  . Bladder cancer (HCC)    resection x3  . BMI 27.0-27.9,adult 02/23/2023  . Cellulitis 07/17/2023  . Chronic combined systolic (congestive) and diastolic (congestive) heart failure (HCC) 07/22/2022  . Chronic left shoulder pain 05/24/2023  . Chronic right shoulder pain 09/02/2022  . Chronic systolic CHF (congestive heart failure) (HCC)   . CKD stage 3b, GFR 30-44 ml/min (HCC) 07/03/2023  . Coronary  artery disease    status post DMI RX Taxus stent RCA 2006 with susequent  Stent LAD and subsequent  stent thrombosis RCA unable to be opened 2006 -neg mv 10/2008, 10/16/17 ISR to pLAD with PTCA/DES, CTO of RCA with collaterals, EF 25%  . Coronary artery disease involving native coronary artery of native heart with unstable angina pectoris (HCC)   . Current moderate episode of major depressive disorder without prior episode (HCC) 03/28/2022  . Diverticulitis 06/10/2022  . Diverticulitis large intestine 05/11/2021  . Diverticulitis of large intestine with abscess 05/24/2022  . Diverticulitis of sigmoid colon 05/11/2021  . DM2 (diabetes mellitus, type 2) (HCC) 02/09/2022   status post bilateral aortobifemoral bypass [id number] with recent fem to fembypass April  2011 per DR. Early    . Dyssynergic defecation 03/21/2023  . Essential hypertension 07/22/2022  . Fatigue 03/13/2023  . History of bladder cancer 02/23/2023  . History of colonic diverticulitis 03/21/2023  . Hypercholesterolemia 10/28/2022  . HYPERLIPIDEMIA-MIXED 09/22/2008   Qualifier: Diagnosis of  By: Jaramillo, Luz    . Hypothyroidism 05/24/2022  . Ischemic cardiomyopathy    ejection fraction of 40-45%  . Lethargy 03/28/2022  . Lumbar spinal stenosis   . Myocardial infarction (HCC)    I've had 4 (10/16/2017)  . Need for prophylactic vaccination and inoculation against influenza 03/28/2022  . NSVT (nonsustained ventricular tachycardia) (HCC)   . PAD (peripheral artery disease) (HCC) 07/17/2023  . PAF (paroxysmal atrial fibrillation) (HCC) 02/09/2022  . Paroxysmal atrial fibrillation (HCC)   . Peripheral artery disease (HCC) 07/09/2023  . Poorly controlled T2 diabetes mellitus (HCC) 09/27/2022  . Protein-calorie malnutrition, severe 05/29/2022  . PVC's (premature ventricular contractions)   . PVD 09/22/2008   Qualifier: Diagnosis of  By: Malon Seamen    . PVD (peripheral vascular disease) (HCC) 02/23/2023  . Rotator cuff arthropathy of right shoulder 09/27/2022  . Sigmoid diverticulitis 06/30/2023   . Status post coronary artery stent placement   . Status post Hartmann's procedure (HCC) 03/21/2023  . TOBACCO ABUSE 05/29/2009   Qualifier: Diagnosis of  By: Emma Hardy RN, BSN, Levern Reader Trigger ring finger of right hand 03/29/2023  . Type 2 diabetes mellitus with other circulatory complications (HCC) 07/22/2022  . Type II diabetes mellitus (HCC)   . Unstable angina (HCC) 10/16/2017    Outpatient Medications Prior to Visit  Medication Sig Dispense Refill  . amLODipine  (NORVASC ) 5 MG tablet Take 1 tablet (5 mg total) by mouth daily. 90 tablet 3  . aspirin  EC 81 MG tablet Take 1 tablet (81 mg total) by mouth daily. Swallow whole. 90 tablet 3  . carvedilol  (COREG ) 12.5 MG tablet Take 1 tablet (12.5 mg total) by mouth 2 (two) times daily with a meal. 180 tablet 3  . ceFAZolin  (ANCEF ) IVPB Inject 2 g into the vein every 8 (eight) hours. Indication:  MSSA vascular graft infection First Dose: Yes Last Day of Therapy:  10/10/2023 Labs - Once weekly:  CBC/D and BMP, Labs - Once weekly: ESR and CRP Method of administration: IV Push Method of administration may be changed at the discretion of home infusion pharmacist based upon assessment of the patient and/or caregiver's ability to self-administer the medication ordered. 120 Units 0  . cetirizine  (ZYRTEC ) 10 MG tablet Take 1 tablet (10 mg total) by mouth daily. 90 tablet 0  . cyclobenzaprine  (FLEXERIL ) 10 MG tablet Take 1 tablet (10 mg total) by mouth 3 (three) times daily as needed for muscle spasms. 15 tablet 0  .  ezetimibe  (ZETIA ) 10 MG tablet Take 1 tablet (10 mg total) by mouth daily. 90 tablet 3  . insulin  aspart (NOVOLOG ) 100 UNIT/ML FlexPen Inject 12 Units into the skin 3 (three) times daily with meals. 15 mL 11  . insulin  glargine (LANTUS ) 100 unit/mL SOPN 40 units subcut daily at nighttime    . isosorbide  dinitrate (ISORDIL ) 10 MG tablet Take 1 tablet (10 mg total) by mouth 2 (two) times daily. 180 tablet 3  . JARDIANCE  10 MG TABS  tablet Take 1 tablet (10 mg total) by mouth daily. 30 tablet 2  . loperamide  (IMODIUM  A-D) 2 MG tablet Take 2 mg by mouth 4 (four) times daily as needed for diarrhea or loose stools.    . nitroGLYCERIN  (NITROSTAT ) 0.4 MG SL tablet Place 0.4 mg under the tongue every 5 (five) minutes as needed for chest pain.    . oxyCODONE -acetaminophen  (PERCOCET) 5-325 MG tablet Take 1 tablet by mouth every 6 (six) hours as needed for severe pain (pain score 7-10). 20 tablet 0  . rivaroxaban  (XARELTO ) 20 MG TABS tablet Take 1 tablet (20 mg total) by mouth daily with supper. 30 tablet 3  . rosuvastatin  (CRESTOR ) 40 MG tablet TAKE 1 TABLET(40 MG) BY MOUTH DAILY 90 tablet 1  . tamsulosin  (FLOMAX ) 0.4 MG CAPS capsule Take 0.8 mg by mouth every evening.     No facility-administered medications prior to visit.     Allergies  Allergen Reactions  . Lisinopril  Swelling and Rash    Rash - face and tounge swell  . Levaquin [Levofloxacin]     Social History   Tobacco Use  . Smoking status: Former    Current packs/day: 0.00    Average packs/day: 0.5 packs/day for 56.0 years (28.0 ttl pk-yrs)    Types: Cigars, Cigarettes    Start date: 75    Quit date: 2022    Years since quitting: 3.4  . Smokeless tobacco: Former    Types: Engineer, drilling  . Vaping status: Never Used  Substance Use Topics  . Alcohol use: Yes    Comment: Rarely 1 beer  . Drug use: Never    Family History  Problem Relation Age of Onset  . Coronary artery disease Unknown   . Diabetes Unknown   . Cardiomyopathy Mother   . Heart disease Mother   . Hyperlipidemia Mother   . Coronary artery disease Father   . Stroke Father   . Diabetes Father   . Heart disease Father        before age 78  . Hyperlipidemia Father   . Heart attack Father   . Diabetes Sister   . Heart disease Sister        before age 77  . Hyperlipidemia Sister   . Heart attack Sister     Objective:   Vitals:   10/05/23 1039  BP: 135/83  Pulse: 83  SpO2:  95%  Weight: 178 lb (80.7 kg)  Height: 5' 7 (1.702 m)   Body mass index is 27.88 kg/m.  Physical Exam  Lab Results: Lab Results  Component Value Date   WBC 8.9 09/27/2023   HGB 11.4 (L) 09/27/2023   HCT 37.0 (L) 09/27/2023   MCV 88 09/27/2023   PLT 279 09/27/2023    Lab Results  Component Value Date   CREATININE 1.23 09/27/2023   BUN 13 09/27/2023   NA 138 09/27/2023   K 4.7 09/27/2023   CL 102 09/27/2023   CO2 21 09/27/2023  Lab Results  Component Value Date   ALT <5 09/27/2023   AST 18 09/27/2023   ALKPHOS 87 09/27/2023   BILITOT 0.3 09/27/2023     Assessment & Plan:   Problem List Items Addressed This Visit   None   *** will return to clinic in *** {weeks/months} for follow up 6/11 stabel labs Pull picc last odse abx 6/24' Pt saw dr. Jeananne Mighty rtc in 6/8 weks   Start cefadrol 1gm bid 4.5 month F.u with dr. Levern Reader in one month Orlie Bjornstad, MD [facility]   10/05/23  10:44 AM

## 2023-10-05 NOTE — Progress Notes (Signed)
 Rx Refill

## 2023-10-05 NOTE — Progress Notes (Signed)
 Patient called.  Patient aware.  I have called and informed the patient  His labs look good but cholesterol is still not controlled.  Increase his lantus  to 46 Units daily. Aaron Aas  Pt aware, sent a refill for Lantus  and added 46 units daily.

## 2023-10-06 DIAGNOSIS — E119 Type 2 diabetes mellitus without complications: Secondary | ICD-10-CM | POA: Diagnosis not present

## 2023-10-06 DIAGNOSIS — T827XXA Infection and inflammatory reaction due to other cardiac and vascular devices, implants and grafts, initial encounter: Secondary | ICD-10-CM | POA: Diagnosis not present

## 2023-10-09 DIAGNOSIS — M12811 Other specific arthropathies, not elsewhere classified, right shoulder: Secondary | ICD-10-CM | POA: Diagnosis not present

## 2023-10-09 DIAGNOSIS — N401 Enlarged prostate with lower urinary tract symptoms: Secondary | ICD-10-CM | POA: Diagnosis not present

## 2023-10-09 DIAGNOSIS — I5042 Chronic combined systolic (congestive) and diastolic (congestive) heart failure: Secondary | ICD-10-CM | POA: Diagnosis not present

## 2023-10-09 DIAGNOSIS — E039 Hypothyroidism, unspecified: Secondary | ICD-10-CM | POA: Diagnosis not present

## 2023-10-09 DIAGNOSIS — Z7982 Long term (current) use of aspirin: Secondary | ICD-10-CM | POA: Diagnosis not present

## 2023-10-09 DIAGNOSIS — N1832 Chronic kidney disease, stage 3b: Secondary | ICD-10-CM | POA: Diagnosis not present

## 2023-10-09 DIAGNOSIS — Z794 Long term (current) use of insulin: Secondary | ICD-10-CM | POA: Diagnosis not present

## 2023-10-09 DIAGNOSIS — Z792 Long term (current) use of antibiotics: Secondary | ICD-10-CM | POA: Diagnosis not present

## 2023-10-09 DIAGNOSIS — E1151 Type 2 diabetes mellitus with diabetic peripheral angiopathy without gangrene: Secondary | ICD-10-CM | POA: Diagnosis not present

## 2023-10-09 DIAGNOSIS — I480 Paroxysmal atrial fibrillation: Secondary | ICD-10-CM | POA: Diagnosis not present

## 2023-10-09 DIAGNOSIS — K219 Gastro-esophageal reflux disease without esophagitis: Secondary | ICD-10-CM | POA: Diagnosis not present

## 2023-10-09 DIAGNOSIS — Z452 Encounter for adjustment and management of vascular access device: Secondary | ICD-10-CM | POA: Diagnosis not present

## 2023-10-09 DIAGNOSIS — T827XXA Infection and inflammatory reaction due to other cardiac and vascular devices, implants and grafts, initial encounter: Secondary | ICD-10-CM | POA: Diagnosis not present

## 2023-10-09 DIAGNOSIS — R159 Full incontinence of feces: Secondary | ICD-10-CM | POA: Diagnosis not present

## 2023-10-09 DIAGNOSIS — N39498 Other specified urinary incontinence: Secondary | ICD-10-CM | POA: Diagnosis not present

## 2023-10-09 DIAGNOSIS — I70219 Atherosclerosis of native arteries of extremities with intermittent claudication, unspecified extremity: Secondary | ICD-10-CM | POA: Diagnosis not present

## 2023-10-09 DIAGNOSIS — M48061 Spinal stenosis, lumbar region without neurogenic claudication: Secondary | ICD-10-CM | POA: Diagnosis not present

## 2023-10-09 DIAGNOSIS — I130 Hypertensive heart and chronic kidney disease with heart failure and stage 1 through stage 4 chronic kidney disease, or unspecified chronic kidney disease: Secondary | ICD-10-CM | POA: Diagnosis not present

## 2023-10-09 DIAGNOSIS — I2510 Atherosclerotic heart disease of native coronary artery without angina pectoris: Secondary | ICD-10-CM | POA: Diagnosis not present

## 2023-10-09 DIAGNOSIS — I4729 Other ventricular tachycardia: Secondary | ICD-10-CM | POA: Diagnosis not present

## 2023-10-09 DIAGNOSIS — I252 Old myocardial infarction: Secondary | ICD-10-CM | POA: Diagnosis not present

## 2023-10-09 DIAGNOSIS — I255 Ischemic cardiomyopathy: Secondary | ICD-10-CM | POA: Diagnosis not present

## 2023-10-09 DIAGNOSIS — E7800 Pure hypercholesterolemia, unspecified: Secondary | ICD-10-CM | POA: Diagnosis not present

## 2023-10-09 DIAGNOSIS — K5732 Diverticulitis of large intestine without perforation or abscess without bleeding: Secondary | ICD-10-CM | POA: Diagnosis not present

## 2023-10-10 DIAGNOSIS — E119 Type 2 diabetes mellitus without complications: Secondary | ICD-10-CM | POA: Diagnosis not present

## 2023-10-16 ENCOUNTER — Ambulatory Visit: Admitting: Internal Medicine

## 2023-10-16 DIAGNOSIS — I70219 Atherosclerosis of native arteries of extremities with intermittent claudication, unspecified extremity: Secondary | ICD-10-CM | POA: Diagnosis not present

## 2023-10-16 DIAGNOSIS — E7800 Pure hypercholesterolemia, unspecified: Secondary | ICD-10-CM | POA: Diagnosis not present

## 2023-10-16 DIAGNOSIS — I255 Ischemic cardiomyopathy: Secondary | ICD-10-CM | POA: Diagnosis not present

## 2023-10-16 DIAGNOSIS — N1832 Chronic kidney disease, stage 3b: Secondary | ICD-10-CM | POA: Diagnosis not present

## 2023-10-16 DIAGNOSIS — K5732 Diverticulitis of large intestine without perforation or abscess without bleeding: Secondary | ICD-10-CM | POA: Diagnosis not present

## 2023-10-16 DIAGNOSIS — I252 Old myocardial infarction: Secondary | ICD-10-CM | POA: Diagnosis not present

## 2023-10-16 DIAGNOSIS — R159 Full incontinence of feces: Secondary | ICD-10-CM | POA: Diagnosis not present

## 2023-10-16 DIAGNOSIS — E1151 Type 2 diabetes mellitus with diabetic peripheral angiopathy without gangrene: Secondary | ICD-10-CM | POA: Diagnosis not present

## 2023-10-16 DIAGNOSIS — Z792 Long term (current) use of antibiotics: Secondary | ICD-10-CM | POA: Diagnosis not present

## 2023-10-16 DIAGNOSIS — I2510 Atherosclerotic heart disease of native coronary artery without angina pectoris: Secondary | ICD-10-CM | POA: Diagnosis not present

## 2023-10-16 DIAGNOSIS — K219 Gastro-esophageal reflux disease without esophagitis: Secondary | ICD-10-CM | POA: Diagnosis not present

## 2023-10-16 DIAGNOSIS — I4729 Other ventricular tachycardia: Secondary | ICD-10-CM | POA: Diagnosis not present

## 2023-10-16 DIAGNOSIS — Z452 Encounter for adjustment and management of vascular access device: Secondary | ICD-10-CM | POA: Diagnosis not present

## 2023-10-16 DIAGNOSIS — Z7982 Long term (current) use of aspirin: Secondary | ICD-10-CM | POA: Diagnosis not present

## 2023-10-16 DIAGNOSIS — N401 Enlarged prostate with lower urinary tract symptoms: Secondary | ICD-10-CM | POA: Diagnosis not present

## 2023-10-16 DIAGNOSIS — T827XXA Infection and inflammatory reaction due to other cardiac and vascular devices, implants and grafts, initial encounter: Secondary | ICD-10-CM | POA: Diagnosis not present

## 2023-10-16 DIAGNOSIS — E039 Hypothyroidism, unspecified: Secondary | ICD-10-CM | POA: Diagnosis not present

## 2023-10-16 DIAGNOSIS — I130 Hypertensive heart and chronic kidney disease with heart failure and stage 1 through stage 4 chronic kidney disease, or unspecified chronic kidney disease: Secondary | ICD-10-CM | POA: Diagnosis not present

## 2023-10-16 DIAGNOSIS — I480 Paroxysmal atrial fibrillation: Secondary | ICD-10-CM | POA: Diagnosis not present

## 2023-10-16 DIAGNOSIS — I5042 Chronic combined systolic (congestive) and diastolic (congestive) heart failure: Secondary | ICD-10-CM | POA: Diagnosis not present

## 2023-10-16 DIAGNOSIS — N39498 Other specified urinary incontinence: Secondary | ICD-10-CM | POA: Diagnosis not present

## 2023-10-16 DIAGNOSIS — Z794 Long term (current) use of insulin: Secondary | ICD-10-CM | POA: Diagnosis not present

## 2023-10-16 DIAGNOSIS — M12811 Other specific arthropathies, not elsewhere classified, right shoulder: Secondary | ICD-10-CM | POA: Diagnosis not present

## 2023-10-16 DIAGNOSIS — M48061 Spinal stenosis, lumbar region without neurogenic claudication: Secondary | ICD-10-CM | POA: Diagnosis not present

## 2023-10-27 DIAGNOSIS — E1165 Type 2 diabetes mellitus with hyperglycemia: Secondary | ICD-10-CM | POA: Diagnosis not present

## 2023-10-27 DIAGNOSIS — E1151 Type 2 diabetes mellitus with diabetic peripheral angiopathy without gangrene: Secondary | ICD-10-CM | POA: Diagnosis not present

## 2023-10-27 DIAGNOSIS — I10 Essential (primary) hypertension: Secondary | ICD-10-CM | POA: Diagnosis not present

## 2023-10-27 DIAGNOSIS — Z794 Long term (current) use of insulin: Secondary | ICD-10-CM | POA: Diagnosis not present

## 2023-10-30 ENCOUNTER — Other Ambulatory Visit: Payer: Self-pay | Admitting: Cardiology

## 2023-11-09 ENCOUNTER — Encounter (HOSPITAL_COMMUNITY): Payer: Self-pay

## 2023-11-13 ENCOUNTER — Other Ambulatory Visit: Payer: Self-pay

## 2023-11-13 ENCOUNTER — Other Ambulatory Visit: Payer: Self-pay | Admitting: Cardiology

## 2023-11-14 ENCOUNTER — Encounter (HOSPITAL_COMMUNITY): Admission: RE | Admit: 2023-11-14 | Source: Ambulatory Visit

## 2023-11-20 ENCOUNTER — Encounter: Payer: Self-pay | Admitting: Cardiology

## 2023-11-20 ENCOUNTER — Ambulatory Visit: Attending: Cardiology | Admitting: Cardiology

## 2023-11-20 VITALS — BP 146/70 | HR 83 | Ht 67.0 in | Wt 179.0 lb

## 2023-11-20 DIAGNOSIS — I10 Essential (primary) hypertension: Secondary | ICD-10-CM | POA: Diagnosis not present

## 2023-11-20 DIAGNOSIS — I70219 Atherosclerosis of native arteries of extremities with intermittent claudication, unspecified extremity: Secondary | ICD-10-CM

## 2023-11-20 DIAGNOSIS — E7800 Pure hypercholesterolemia, unspecified: Secondary | ICD-10-CM

## 2023-11-20 DIAGNOSIS — I480 Paroxysmal atrial fibrillation: Secondary | ICD-10-CM

## 2023-11-20 DIAGNOSIS — I5022 Chronic systolic (congestive) heart failure: Secondary | ICD-10-CM | POA: Diagnosis not present

## 2023-11-20 DIAGNOSIS — I779 Disorder of arteries and arterioles, unspecified: Secondary | ICD-10-CM

## 2023-11-20 DIAGNOSIS — E119 Type 2 diabetes mellitus without complications: Secondary | ICD-10-CM

## 2023-11-20 NOTE — Patient Instructions (Signed)
 Medication Instructions:  Your physician recommends that you continue on your current medications as directed. Please refer to the Current Medication list given to you today.  *If you need a refill on your cardiac medications before your next appointment, please call your pharmacy*   Lab Work: Your physician recommends that you return for lab work in: BMP, Lipid, AST, ALT, HgbA1C You need to have labs done when you are fasting.  You can come Monday through Friday 8:30 am to 12:00 pm and 1:15 to 4:30. You do not need to make an appointment as the order has already been placed. The labs you are going to have done are BMET, CBC, TSH, LFT and Lipids.    Testing/Procedures: Your physician has requested that you have a carotid duplex. This test is an ultrasound of the carotid arteries in your neck. It looks at blood flow through these arteries that supply the brain with blood. Allow one hour for this exam. There are no restrictions or special instructions.    Follow-Up: At [facility], you and your health needs are our priority.  As part of our continuing mission to provide you with exceptional heart care, we have created designated Provider Care Teams.  These Care Teams include your primary Cardiologist (physician) and Advanced Practice Providers (APPs -  Physician Assistants and Nurse Practitioners) who all work together to provide you with the care you need, when you need it.  We recommend signing up for the patient portal called MyChart.  Sign up information is provided on this After Visit Summary.  MyChart is used to connect with patients for Virtual Visits (Telemedicine).  Patients are able to view lab/test results, encounter notes, upcoming appointments, etc.  Non-urgent messages can be sent to your provider as well.   To learn more about what you can do with MyChart, go to [email or web address].    Your next appointment:   3 month(s)  The format for your next appointment:   In  Person  Provider:   Lamar Fitch, MD    Other Instructions NA

## 2023-11-20 NOTE — Progress Notes (Unsigned)
 Cardiology Office Note:    Date:  11/20/2023   ID:  [name], DOB Sep 09, 1945, MRN [id number]  PCP:  Fleeta Valeria Mayo, MD  Cardiologist:  Lamar Fitch, MD    Referring MD: Fleeta Valeria, Mayo, MD   Chief Complaint  Patient presents with   Follow-up    History of Present Illness:    [name] is a 78 y.o. male  with extremely complex past medical history that include paroxysmal atrial fibrillation, COPD, history of ischemic cardiomyopathy, essential hypertension, hyperlipidemia.  He comes today to my office to talk about incoming surgery for his reverse colostomy as well as potential shoulder surgery ischemic history is very complex initial event happened in 1992 when he suffered from inferior wall myocardial infarction, PTCA and stent to RCA has been placed at that time 2000 for anterior wall MI required stent to LAD in 2006 he had another myocardial infarction low inferior wall with Taxus stent placed in the right coronary artery and shortly after that another cardiac catheterization has been performed which showed total occlusion of right coronary artery.  Last cardiac catheterization done in July 2019 showing severe stenosis of the proximal LAD treated with drug-eluting stent right coronary artery was chronically occluded echocardiogram from October 2019 showed ejection fraction 25 to 30%.  Last elation of his ejection fraction was done in March of this year which showed ejection fraction 25 to 30%, he also got a stress test done at the time which show old MI but no ischemia. Since I seen him last time he did have acute occlusion of the SFA and required surgical intervention.  After that had infection but recovered quite nicely.  Comes today to months for follow-up doing well he described the fact he gets short of breath while walking he slows down and kept going.  No swelling of lower extremities still does some pain in the leg but very little  Past Medical History:  Diagnosis Date    Acute low back pain 01/12/2015   Acute lower limb ischemia 07/11/2023   Acute pain of both shoulders 07/22/2022   AKI (acute kidney injury) (HCC) 05/24/2022   Arthritis    Atherosclerosis of native arteries of the extremities with intermittent claudication 08/13/2013   Atherosclerosis of native artery of extremity with intermittent claudication (HCC) 08/13/2013   IMO SNOMED Dx Update Oct 2024     Backache 02/08/2013   Benign prostatic hyperplasia with urinary frequency 02/23/2023   Bladder cancer (HCC)    resection x3   BMI 27.0-27.9,adult 02/23/2023   Cellulitis 07/17/2023   Chronic combined systolic (congestive) and diastolic (congestive) heart failure (HCC) 07/22/2022   Chronic left shoulder pain 05/24/2023   Chronic right shoulder pain 09/02/2022   Chronic systolic CHF (congestive heart failure) (HCC)    CKD stage 3b, GFR 30-44 ml/min (HCC) 07/03/2023   Coronary artery disease    status post DMI RX Taxus stent RCA 2006 with susequent Stent LAD and subsequent  stent thrombosis RCA unable to be opened 2006 -neg mv 10/2008, 10/16/17 ISR to pLAD with PTCA/DES, CTO of RCA with collaterals, EF 25%   Coronary artery disease involving native coronary artery of native heart with unstable angina pectoris (HCC)    Current moderate episode of major depressive disorder without prior episode (HCC) 03/28/2022   Diverticulitis 06/10/2022   Diverticulitis large intestine 05/11/2021   Diverticulitis of large intestine with abscess 05/24/2022   Diverticulitis of sigmoid colon 05/11/2021   DM2 (diabetes mellitus, type 2) (HCC)  02/09/2022   status post bilateral aortobifemoral bypass [id number] with recent fem to fembypass April  2011 per DR. Early     Dyssynergic defecation 03/21/2023   Essential hypertension 07/22/2022   Fatigue 03/13/2023   History of bladder cancer 02/23/2023   History of colonic diverticulitis 03/21/2023   Hypercholesterolemia 10/28/2022   HYPERLIPIDEMIA-MIXED 09/22/2008    Qualifier: Diagnosis of  By: Justina Kos     Hypothyroidism 05/24/2022   Ischemic cardiomyopathy    ejection fraction of 40-45%   Lethargy 03/28/2022   Lumbar spinal stenosis    Myocardial infarction (HCC)    I've had 4 (10/16/2017)   Need for prophylactic vaccination and inoculation against influenza 03/28/2022   NSVT (nonsustained ventricular tachycardia) (HCC)    PAD (peripheral artery disease) (HCC) 07/17/2023   PAF (paroxysmal atrial fibrillation) (HCC) 02/09/2022   Paroxysmal atrial fibrillation (HCC)    Peripheral artery disease (HCC) 07/09/2023   Poorly controlled T2 diabetes mellitus (HCC) 09/27/2022   Protein-calorie malnutrition, severe 05/29/2022   PVC's (premature ventricular contractions)    PVD 09/22/2008   Qualifier: Diagnosis of  By: Justina Kos     PVD (peripheral vascular disease) (HCC) 02/23/2023   Rotator cuff arthropathy of right shoulder 09/27/2022   Sigmoid diverticulitis 06/30/2023   Status post coronary artery stent placement    Status post Hartmann's procedure (HCC) 03/21/2023   TOBACCO ABUSE 05/29/2009   Qualifier: Diagnosis of  By: Velinda, RN, BSN, Avelina CROME    Trigger ring finger of right hand 03/29/2023   Type 2 diabetes mellitus with other circulatory complications (HCC) 07/22/2022   Type II diabetes mellitus (HCC)    Unstable angina (HCC) 10/16/2017    Past Surgical History:  Procedure Laterality Date   ABDOMINAL AORTOGRAM W/LOWER EXTREMITY Right 07/10/2023   Procedure: ABDOMINAL AORTOGRAM W/LOWER EXTREMITY;  Surgeon: Pearline Norman RAMAN, MD;  Location: [facility] INVASIVE CV LAB;  Service: Cardiovascular;  Laterality: Right;   AORTA - BILATERAL FEMORAL ARTERY BYPASS GRAFT Bilateral 1992   APPENDECTOMY     APPLICATION OF WOUND VAC Left 08/26/2023   Procedure: APPLICATION, WOUND VAC left groin;  Surgeon: Sheree Penne Bruckner, MD;  Location: [facility] OR;  Service: Vascular;  Laterality: Left;   APPLICATION, SKIN SUBSTITUTE Left 08/29/2023   Procedure:  APPLICATION, SKIN SUBSTITUTE;  Surgeon: Sheree Penne Bruckner, MD;  Location[facility] OR;  Service: Vascular;  Laterality: Left;   BACK SURGERY     COLECTOMY WITH COLOSTOMY CREATION/HARTMANN PROCEDURE N/A 05/26/2022   Procedure: COLECTOMY WITH COLOSTOMY CREATION/HARTMANN PROCEDURE;  Surgeon: Paola Dreama SAILOR, MD;  Location: [facility] OR;  Service: General;  Laterality: N/A;   CORONARY ANGIOPLASTY WITH STENT PLACEMENT     taxus stent  placed into his right coronary artery; stent placed to the LAD.     CORONARY STENT INTERVENTION N/A 10/16/2017   Procedure: CORONARY STENT INTERVENTION;  Surgeon: Verlin Bruckner BIRCH, MD;  Location: [facility] INVASIVE CV LAB;  Service: Cardiovascular;  Laterality: N/A;   FEMORAL-FEMORAL BYPASS GRAFT  07/2009   INCISION AND DRAINAGE OF WOUND Left 08/26/2023   Procedure: IRRIGATION AND DEBRIDEMENT left groin  WOUND, application of antibiotic beads;  Surgeon: Sheree Penne Bruckner, MD;  Location: [facility] OR;  Service: Vascular;  Laterality: Left;  WASHOUT LEFT GROIN   INCISION AND DRAINAGE OF WOUND Left 08/29/2023   Procedure: IRRIGATION AND DEBRIDEMENT WOUND;  Surgeon: Sheree Penne Bruckner, MD;  Location: [facility] OR;  Service: Vascular;  Laterality: Left;  WASHOUT LEFT GROIN REPEAT   LAPAROTOMY N/A 05/26/2022   Procedure: EXPLORATORY LAPAROTOMY;  Surgeon: Paola Dreama SAILOR, MD;  Location: [facility] OR;  Service: General;  Laterality: N/A;   LOWER EXTREMITY INTERVENTION Right 07/10/2023   Procedure: LOWER EXTREMITY INTERVENTION;  Surgeon: Pearline Norman RAMAN, MD;  Location: [facility] INVASIVE CV LAB;  Service: Cardiovascular;  Laterality: Right;   LUMBAR MICRODISCECTOMY  12/31/08   LYSIS OF ADHESION N/A 06/30/2023   Procedure: LAPAROSCOPIC LYSIS OF ADHESIONS;  Surgeon: Sheldon Standing, MD;  Location: [facility] ORS;  Service: General;  Laterality: N/A;   PATCH ANGIOPLASTY Left 08/26/2023   Procedure: Primary repair of left femoral graftotomy;  Surgeon: Sheree Penne Bruckner, MD;  Location: [facility] OR;  Service: Vascular;   Laterality: Left;   PERIPHERAL VASCULAR THROMBECTOMY Right 07/10/2023   Procedure: PERIPHERAL VASCULAR THROMBECTOMY;  Surgeon: Pearline Norman RAMAN, MD;  Location: [facility] INVASIVE CV LAB;  Service: Cardiovascular;  Laterality: Right;   PROCTOSCOPY N/A 06/30/2023   Procedure: Flexible sigmoidoscopy;  Surgeon: Sheldon Standing, MD;  Location: [facility] ORS;  Service: General;  Laterality: N/A;   RIGHT/LEFT HEART CATH AND CORONARY ANGIOGRAPHY N/A 10/16/2017   Procedure: RIGHT/LEFT HEART CATH AND CORONARY ANGIOGRAPHY;  Surgeon: Verlin Bruckner BIRCH, MD;  Location: [facility] INVASIVE CV LAB;  Service: Cardiovascular;  Laterality: N/A;   SHOULDER OPEN ROTATOR CUFF REPAIR Left    VACUUM ASSISTED CLOSURE CHANGE Left 08/29/2023   Procedure: REPLACEMENT, WOUND VAC DRESSING, GROIN;  Surgeon: Sheree Penne Bruckner, MD;  Location: [facility] OR;  Service: Vascular;  Laterality: Left;   XI ROBOTIC ASSISTED COLOSTOMY TAKEDOWN N/A 06/30/2023   Procedure: XI ROBOTIC ASSISTED OSTOMY TAKEDOWN, PARTIAL RECTOSIGMOID RESECTION, BILATERAL TAP BLOCK;  Surgeon: Sheldon Standing, MD;  Location: [facility] ORS;  Service: General;  Laterality: N/A;    Current Medications: Current Meds  Medication Sig   amLODipine  (NORVASC ) 5 MG tablet TAKE 1 TABLET(5 MG) BY MOUTH DAILY   aspirin  EC 81 MG tablet Take 1 tablet (81 mg total) by mouth daily. Swallow whole.   carvedilol  (COREG ) 12.5 MG tablet Take 1 tablet (12.5 mg total) by mouth 2 (two) times daily with a meal.   cefadroxil  (DURICEF) 500 MG capsule Take 2 capsules (1,000 mg total) by mouth 2 (two) times daily.   cetirizine  (ZYRTEC ) 10 MG tablet Take 1 tablet (10 mg total) by mouth daily.   cyclobenzaprine  (FLEXERIL ) 10 MG tablet Take 1 tablet (10 mg total) by mouth 3 (three) times daily as needed for muscle spasms.   ezetimibe  (ZETIA ) 10 MG tablet Take 1 tablet (10 mg total) by mouth daily.   insulin  aspart (NOVOLOG ) 100 UNIT/ML FlexPen Inject 12 Units into the skin 3 (three) times daily with meals.   insulin   glargine (LANTUS ) 100 unit/mL SOPN 46 units subcut daily at nighttime   isosorbide  dinitrate (ISORDIL ) 10 MG tablet Take 1 tablet (10 mg total) by mouth 2 (two) times daily.   JARDIANCE  10 MG TABS tablet Take 1 tablet (10 mg total) by mouth daily.   loperamide  (IMODIUM  A-D) 2 MG tablet Take 2 mg by mouth 4 (four) times daily as needed for diarrhea or loose stools.   nitroGLYCERIN  (NITROSTAT ) 0.4 MG SL tablet Place 0.4 mg under the tongue every 5 (five) minutes as needed for chest pain.   oxyCODONE -acetaminophen  (PERCOCET) 5-325 MG tablet Take 1 tablet by mouth every 6 (six) hours as needed for severe pain (pain score 7-10).   rivaroxaban  (XARELTO ) 20 MG TABS tablet Take 1 tablet (20 mg total) by mouth daily with supper.   rosuvastatin  (CRESTOR ) 40 MG tablet TAKE 1 TABLET(40 MG) BY MOUTH DAILY  tamsulosin  (FLOMAX ) 0.4 MG CAPS capsule Take 0.8 mg by mouth every evening.     Allergies:   Lisinopril  and Levaquin [levofloxacin]   Social History   Socioeconomic History   Marital status: Divorced    Spouse name: Not on file   Number of children: 1   Years of education: Not on file   Highest education level: Not on file  Occupational History   Occupation: retired    Associate Professor: RETIRED    Comment: truck driver  Tobacco Use   Smoking status: Former    Current packs/day: 0.00    Average packs/day: 0.5 packs/day for 56.0 years (28.0 ttl pk-yrs)    Types: Cigars, Cigarettes    Start date: 1966    Quit date: 2022    Years since quitting: 3.5   Smokeless tobacco: Former    Types: Engineer, drilling   Vaping status: Never Used  Substance and Sexual Activity   Alcohol use: Yes    Comment: Rarely 1 beer   Drug use: Never   Sexual activity: Not Currently  Other Topics Concern   Not on file  Social History Narrative   Lives with girlfriend in [place].     Social Drivers of Corporate investment banker Strain: Not on file  Food Insecurity: No Food Insecurity (08/25/2023)   Hunger Vital Sign     Worried About Running Out of Food in the Last Year: Never true    Ran Out of Food in the Last Year: Never true  Transportation Needs: No Transportation Needs (08/25/2023)   PRAPARE - Administrator, Civil Service (Medical): No    Lack of Transportation (Non-Medical): No  Physical Activity: Not on file  Stress: Not on file  Social Connections: Socially Integrated (08/25/2023)   Social Connection and Isolation Panel    Frequency of Communication with Friends and Family: Twice a week    Frequency of Social Gatherings with Friends and Family: Twice a week    Attends Religious Services: 1 to 4 times per year    Active Member of [employer] or Organizations: Yes    Attends Banker Meetings: 1 to 4 times per year    Marital Status: Married     Family History: The patient's family history includes Cardiomyopathy in his mother; Coronary artery disease in his father and unknown relative; Diabetes in his father, sister, and unknown relative; Heart attack in his father and sister; Heart disease in his father, mother, and sister; Hyperlipidemia in his father, mother, and sister; Stroke in his father. ROS:   Please see the history of present illness.    All 14 point review of systems negative except as described per history of present illness  EKGs/Labs/Other Studies Reviewed:         Recent Labs: 02/22/2023: TSH 2.760 08/31/2023: Magnesium 1.9 09/27/2023: ALT <5; BUN 13; Creatinine, Ser 1.23; Hemoglobin 11.4; Platelets 279; Potassium 4.7; Sodium 138  Recent Lipid Panel    Component Value Date/Time   CHOL 99 (L) 09/27/2023 1100   TRIG 112 09/27/2023 1100   HDL 34 (L) 09/27/2023 1100   CHOLHDL 2.9 09/27/2023 1100   CHOLHDL 6.1 CALC 11/07/2006 0958   VLDL 20 11/07/2006 0958   LDLCALC 44 09/27/2023 1100    Physical Exam:    VS:  BP (!) 146/70   Pulse 83   Ht 5' 7 (1.702 m)   Wt 179 lb (81.2 kg)   SpO2 96%   BMI 28.04 kg/m  Wt Readings from Last 3 Encounters:   11/20/23 179 lb (81.2 kg)  10/05/23 178 lb (80.7 kg)  10/04/23 178 lb 11.2 oz (81.1 kg)     GEN:  Well nourished, well developed in no acute distress HEENT: Normal NECK: No JVD; No carotid bruits LYMPHATICS: No lymphadenopathy CARDIAC: RRR, no murmurs, no rubs, no gallops RESPIRATORY:  Clear to auscultation without rales, wheezing or rhonchi  ABDOMEN: Soft, non-tender, non-distended MUSCULOSKELETAL:  No edema; No deformity  SKIN: Warm and dry LOWER EXTREMITIES: no swelling NEUROLOGIC:  Alert and oriented x 3 PSYCHIATRIC:  Normal affect   ASSESSMENT:    1. Atherosclerosis of native artery of lower extremity with intermittent claudication, unspecified laterality (HCC)   2. Chronic systolic CHF (congestive heart failure) (HCC)   3. Essential hypertension   4. PAF (paroxysmal atrial fibrillation) (HCC)    PLAN:    In order of problems listed above:  Coronary disease stable from that point review will switch him from isosorbide  dinitrate to isosorbide  mononitrate since he tell me half of the time he forgets to take 2nd and 3rd dose. Chronic systolic congestive heart show will get Chem-7 based on that we see if we can augment his medications. Paroxysmal atrial fibrillation seems to maintain sinus rhythm, continue anticoagulation will check Chem-7 make sure the dose of her Xarelto  is appropriate.. Essential hypertension well-controlled   Medication Adjustments/Labs and Tests Ordered: Current medicines are reviewed at length with the patient today.  Concerns regarding medicines are outlined above.  No orders of the defined types were placed in this encounter.  Medication changes: No orders of the defined types were placed in this encounter.   Signed, Lamar DOROTHA Fitch, MD, [facility] 11/20/2023 12:44 PM    [facility]

## 2023-12-06 ENCOUNTER — Ambulatory Visit (HOSPITAL_COMMUNITY)
Admission: RE | Admit: 2023-12-06 | Discharge: 2023-12-06 | Disposition: A | Discharge status: Home / Self Care | Source: Ambulatory Visit | Attending: Vascular Surgery | Admitting: Vascular Surgery

## 2023-12-06 ENCOUNTER — Ambulatory Visit (HOSPITAL_BASED_OUTPATIENT_CLINIC_OR_DEPARTMENT_OTHER)
Admission: RE | Admit: 2023-12-06 | Discharge: 2023-12-06 | Disposition: A | Discharge status: Home / Self Care | Source: Ambulatory Visit | Attending: Vascular Surgery | Admitting: Vascular Surgery

## 2023-12-06 ENCOUNTER — Encounter: Payer: Self-pay | Admitting: Vascular Surgery

## 2023-12-06 ENCOUNTER — Ambulatory Visit (INDEPENDENT_AMBULATORY_CARE_PROVIDER_SITE_OTHER): Admitting: Vascular Surgery

## 2023-12-06 ENCOUNTER — Ambulatory Visit (HOSPITAL_BASED_OUTPATIENT_CLINIC_OR_DEPARTMENT_OTHER)
Admission: RE | Admit: 2023-12-06 | Discharge: 2023-12-06 | Disposition: A | Discharge status: Home / Self Care | Source: Ambulatory Visit | Attending: Physician Assistant | Admitting: Physician Assistant

## 2023-12-06 VITALS — BP 141/73 | HR 77 | Temp 97.8°F | Resp 18 | Ht 67.0 in | Wt 180.3 lb

## 2023-12-06 DIAGNOSIS — I739 Peripheral vascular disease, unspecified: Secondary | ICD-10-CM

## 2023-12-06 DIAGNOSIS — T827XXA Infection and inflammatory reaction due to other cardiac and vascular devices, implants and grafts, initial encounter: Secondary | ICD-10-CM

## 2023-12-06 DIAGNOSIS — I70213 Atherosclerosis of native arteries of extremities with intermittent claudication, bilateral legs: Secondary | ICD-10-CM

## 2023-12-06 LAB — VAS US ABI WITH/WO TBI
Left ABI: 0.99
Right ABI: 1.02

## 2023-12-06 NOTE — Progress Notes (Signed)
 Patient ID: [name], male   DOB: [birth year], 78 y.o.   MRN: [id number]  Reason for Consult: Follow-up   Referred by Fleeta Valeria Mayo, MD  Subjective:     HPI:  [name] is a 78 y.o. male history remarkable for aortobifemoral bypass graft over 30 years ago with subsequent revision to left to right femorofemoral bypass.  He underwent angiography for occlusion of his right superficial femoral artery with successful revascularization unfortunately followed up with infection is now status postdebridement and he was closed with fish skin and healed with secondary intention with wound VAC.  He continues on antibiotics for another 2 months totaling 4-1/2 months overall.  States that he does have chest pain and shortness of breath but overall his legs feel better.  He is scheduled to follow-up with cardiology this week.  Past Medical History:  Diagnosis Date   Acute low back pain 01/12/2015   Acute lower limb ischemia 07/11/2023   Acute pain of both shoulders 07/22/2022   AKI (acute kidney injury) (HCC) 05/24/2022   Arthritis    Atherosclerosis of native arteries of the extremities with intermittent claudication 08/13/2013   Atherosclerosis of native artery of extremity with intermittent claudication (HCC) 08/13/2013   IMO SNOMED Dx Update Oct 2024     Backache 02/08/2013   Benign prostatic hyperplasia with urinary frequency 02/23/2023   Bladder cancer (HCC)    resection x3   BMI 27.0-27.9,adult 02/23/2023   Cellulitis 07/17/2023   Chronic combined systolic (congestive) and diastolic (congestive) heart failure (HCC) 07/22/2022   Chronic left shoulder pain 05/24/2023   Chronic right shoulder pain 09/02/2022   Chronic systolic CHF (congestive heart failure) (HCC)    CKD stage 3b, GFR 30-44 ml/min (HCC) 07/03/2023   Coronary artery disease    status post DMI RX Taxus stent RCA 2006 with susequent Stent LAD and subsequent  stent thrombosis RCA unable to be opened 2006 -neg mv 10/2008,  10/16/17 ISR to pLAD with PTCA/DES, CTO of RCA with collaterals, EF 25%   Coronary artery disease involving native coronary artery of native heart with unstable angina pectoris (HCC)    Current moderate episode of major depressive disorder without prior episode (HCC) 03/28/2022   Diverticulitis 06/10/2022   Diverticulitis large intestine 05/11/2021   Diverticulitis of large intestine with abscess 05/24/2022   Diverticulitis of sigmoid colon 05/11/2021   DM2 (diabetes mellitus, type 2) (HCC) 02/09/2022   status post bilateral aortobifemoral bypass [id number] with recent fem to fembypass April  2011 per DR. Early     Dyssynergic defecation 03/21/2023   Essential hypertension 07/22/2022   Fatigue 03/13/2023   History of bladder cancer 02/23/2023   History of colonic diverticulitis 03/21/2023   Hypercholesterolemia 10/28/2022   HYPERLIPIDEMIA-MIXED 09/22/2008   Qualifier: Diagnosis of  By: Justina Kos     Hypothyroidism 05/24/2022   Ischemic cardiomyopathy    ejection fraction of 40-45%   Lethargy 03/28/2022   Lumbar spinal stenosis    Myocardial infarction (HCC)    I've had 4 (10/16/2017)   Need for prophylactic vaccination and inoculation against influenza 03/28/2022   NSVT (nonsustained ventricular tachycardia) (HCC)    PAD (peripheral artery disease) (HCC) 07/17/2023   PAF (paroxysmal atrial fibrillation) (HCC) 02/09/2022   Paroxysmal atrial fibrillation (HCC)    Peripheral arterial disease (HCC)    Peripheral artery disease (HCC) 07/09/2023   Poorly controlled T2 diabetes mellitus (HCC) 09/27/2022   Protein-calorie malnutrition, severe 05/29/2022   PVC's (premature ventricular contractions)  PVD 09/22/2008   Qualifier: Diagnosis of  By: Justina Kos     PVD (peripheral vascular disease) (HCC) 02/23/2023   Rotator cuff arthropathy of right shoulder 09/27/2022   Sigmoid diverticulitis 06/30/2023   Status post coronary artery stent placement    Status post Hartmann's procedure  (HCC) 03/21/2023   TOBACCO ABUSE 05/29/2009   Qualifier: Diagnosis of  By: Velinda, RN, BSN, Avelina CROME    Trigger ring finger of right hand 03/29/2023   Type 2 diabetes mellitus with other circulatory complications (HCC) 07/22/2022   Type II diabetes mellitus (HCC)    Unstable angina (HCC) 10/16/2017   Family History  Problem Relation Age of Onset   Coronary artery disease Unknown    Diabetes Unknown    Cardiomyopathy Mother    Heart disease Mother    Hyperlipidemia Mother    Coronary artery disease Father    Stroke Father    Diabetes Father    Heart disease Father        before age 19   Hyperlipidemia Father    Heart attack Father    Diabetes Sister    Heart disease Sister        before age 21   Hyperlipidemia Sister    Heart attack Sister    Past Surgical History:  Procedure Laterality Date   ABDOMINAL AORTOGRAM W/LOWER EXTREMITY Right 07/10/2023   Procedure: ABDOMINAL AORTOGRAM W/LOWER EXTREMITY;  Surgeon: Pearline Norman RAMAN, MD;  Location: [facility] INVASIVE CV LAB;  Service: Cardiovascular;  Laterality: Right;   AORTA - BILATERAL FEMORAL ARTERY BYPASS GRAFT Bilateral 1992   APPENDECTOMY     APPLICATION OF WOUND VAC Left 08/26/2023   Procedure: APPLICATION, WOUND VAC left groin;  Surgeon: Sheree Penne Bruckner, MD;  Location: [facility] OR;  Service: Vascular;  Laterality: Left;   APPLICATION, SKIN SUBSTITUTE Left 08/29/2023   Procedure: APPLICATION, SKIN SUBSTITUTE;  Surgeon: Sheree Penne Bruckner, MD;  Location: [facility] OR;  Service: Vascular;  Laterality: Left;   BACK SURGERY     COLECTOMY WITH COLOSTOMY CREATION/HARTMANN PROCEDURE N/A 05/26/2022   Procedure: COLECTOMY WITH COLOSTOMY CREATION/HARTMANN PROCEDURE;  Surgeon: Paola Dreama SAILOR, MD;  Location: [facility] OR;  Service: General;  Laterality: N/A;   CORONARY ANGIOPLASTY WITH STENT PLACEMENT     taxus stent  placed into his right coronary artery; stent placed to the LAD.     CORONARY STENT INTERVENTION N/A 10/16/2017   Procedure:  CORONARY STENT INTERVENTION;  Surgeon: Verlin Bruckner BIRCH, MD;  Location: [facility] INVASIVE CV LAB;  Service: Cardiovascular;  Laterality: N/A;   FEMORAL-FEMORAL BYPASS GRAFT  07/2009   INCISION AND DRAINAGE OF WOUND Left 08/26/2023   Procedure: IRRIGATION AND DEBRIDEMENT left groin  WOUND, application of antibiotic beads;  Surgeon: Sheree Penne Bruckner, MD;  Location: [facility];  Service: Vascular;  Laterality: Left;  WASHOUT LEFT GROIN   INCISION AND DRAINAGE OF WOUND Left 08/29/2023   Procedure: IRRIGATION AND DEBRIDEMENT WOUND;  Surgeon: Sheree Penne Bruckner, MD;  Location: [facility] OR;  Service: Vascular;  Laterality: Left;  WASHOUT LEFT GROIN REPEAT   LAPAROTOMY N/A 05/26/2022   Procedure: EXPLORATORY LAPAROTOMY;  Surgeon: Paola Dreama SAILOR, MD;  Location: [facility] OR;  Service: General;  Laterality: N/A;   LOWER EXTREMITY INTERVENTION Right 07/10/2023   Procedure: LOWER EXTREMITY INTERVENTION;  Surgeon: Pearline Norman RAMAN, MD;  Location: [facility] INVASIVE CV LAB;  Service: Cardiovascular;  Laterality: Right;   LUMBAR MICRODISCECTOMY  12/31/08   LYSIS OF ADHESION N/A 06/30/2023   Procedure: LAPAROSCOPIC LYSIS OF ADHESIONS;  Surgeon: Sheldon Standing, MD;  Location: [facility] ORS;  Service: General;  Laterality: N/A;   PATCH ANGIOPLASTY Left 08/26/2023   Procedure: Primary repair of left femoral graftotomy;  Surgeon: Sheree Penne Bruckner, MD;  Location: [facility] OR;  Service: Vascular;  Laterality: Left;   PERIPHERAL VASCULAR THROMBECTOMY Right 07/10/2023   Procedure: PERIPHERAL VASCULAR THROMBECTOMY;  Surgeon: Pearline Norman RAMAN, MD;  Location: [facility] INVASIVE CV LAB;  Service: Cardiovascular;  Laterality: Right;   PROCTOSCOPY N/A 06/30/2023   Procedure: Flexible sigmoidoscopy;  Surgeon: Sheldon Standing, MD;  Location: [facility] ORS;  Service: General;  Laterality: N/A;   RIGHT/LEFT HEART CATH AND CORONARY ANGIOGRAPHY N/A 10/16/2017   Procedure: RIGHT/LEFT HEART CATH AND CORONARY ANGIOGRAPHY;  Surgeon: Verlin Bruckner BIRCH, MD;  Location:  [facility] INVASIVE CV LAB;  Service: Cardiovascular;  Laterality: N/A;   SHOULDER OPEN ROTATOR CUFF REPAIR Left    VACUUM ASSISTED CLOSURE CHANGE Left 08/29/2023   Procedure: REPLACEMENT, WOUND VAC DRESSING, GROIN;  Surgeon: Sheree Penne Bruckner, MD;  Location: [facility] OR;  Service: Vascular;  Laterality: Left;   XI ROBOTIC ASSISTED COLOSTOMY TAKEDOWN N/A 06/30/2023   Procedure: XI ROBOTIC ASSISTED OSTOMY TAKEDOWN, PARTIAL RECTOSIGMOID RESECTION, BILATERAL TAP BLOCK;  Surgeon: Sheldon Standing, MD;  Location: [facility] ORS;  Service: General;  Laterality: N/A;    Short Social History:  Social History   Tobacco Use   Smoking status: Former    Current packs/day: 0.00    Average packs/day: 0.5 packs/day for 56.0 years (28.0 ttl pk-yrs)    Types: Cigars, Cigarettes    Start date: 1966    Quit date: 2022    Years since quitting: 3.6   Smokeless tobacco: Former    Types: Chew  Substance Use Topics   Alcohol use: Yes    Comment: Rarely 1 beer    Allergies  Allergen Reactions   Lisinopril  Swelling and Rash    Rash - face and tounge swell   Levaquin [Levofloxacin]     Current Outpatient Medications  Medication Sig Dispense Refill   amLODipine  (NORVASC ) 5 MG tablet TAKE 1 TABLET(5 MG) BY MOUTH DAILY 90 tablet 2   aspirin  EC 81 MG tablet Take 1 tablet (81 mg total) by mouth daily. Swallow whole. 90 tablet 3   carvedilol  (COREG ) 12.5 MG tablet Take 1 tablet (12.5 mg total) by mouth 2 (two) times daily with a meal. 180 tablet 3   cefadroxil  (DURICEF) 500 MG capsule Take 2 capsules (1,000 mg total) by mouth 2 (two) times daily. 120 capsule 5   cetirizine  (ZYRTEC ) 10 MG tablet Take 1 tablet (10 mg total) by mouth daily. 90 tablet 0   cyclobenzaprine  (FLEXERIL ) 10 MG tablet Take 1 tablet (10 mg total) by mouth 3 (three) times daily as needed for muscle spasms. 15 tablet 0   ezetimibe  (ZETIA ) 10 MG tablet Take 1 tablet (10 mg total) by mouth daily. 90 tablet 3   insulin  aspart (NOVOLOG ) 100 UNIT/ML FlexPen  Inject 12 Units into the skin 3 (three) times daily with meals. 15 mL 11   insulin  glargine (LANTUS ) 100 unit/mL SOPN 46 units subcut daily at nighttime 15 mL 3   isosorbide  dinitrate (ISORDIL ) 10 MG tablet Take 1 tablet (10 mg total) by mouth 2 (two) times daily. 180 tablet 3   JARDIANCE  10 MG TABS tablet Take 1 tablet (10 mg total) by mouth daily. 30 tablet 2   loperamide  (IMODIUM  A-D) 2 MG tablet Take 2 mg by mouth 4 (four) times daily as needed for diarrhea or loose  stools.     nitroGLYCERIN  (NITROSTAT ) 0.4 MG SL tablet Place 0.4 mg under the tongue every 5 (five) minutes as needed for chest pain.     oxyCODONE -acetaminophen  (PERCOCET) 5-325 MG tablet Take 1 tablet by mouth every 6 (six) hours as needed for severe pain (pain score 7-10). 20 tablet 0   rivaroxaban  (XARELTO ) 20 MG TABS tablet Take 1 tablet (20 mg total) by mouth daily with supper. 30 tablet 3   rosuvastatin  (CRESTOR ) 40 MG tablet TAKE 1 TABLET(40 MG) BY MOUTH DAILY 90 tablet 1   tamsulosin  (FLOMAX ) 0.4 MG CAPS capsule Take 0.8 mg by mouth every evening.     No current facility-administered medications for this visit.    Review of Systems  Constitutional: Positive for fatigue.  HENT: HENT negative.  Eyes: Eyes negative.  Respiratory: Positive for shortness of breath.  Cardiovascular: Positive for chest pain.  GI: Gastrointestinal negative.  Musculoskeletal: Musculoskeletal negative.  Skin: Skin negative.  Neurological: Neurological negative. Hematologic: Hematologic/lymphatic negative.  Psychiatric: Psychiatric negative.        Objective:  Objective   Vitals:   12/06/23 1338  BP: (!) 141/73  Pulse: 77  Resp: 18  Temp: 97.8 F (36.6 C)  TempSrc: Temporal  SpO2: 97%  Weight: 180 lb 4.8 oz (81.8 kg)  Height: 5' 7 (1.702 m)   Body mass index is 28.24 kg/m.  Physical Exam Cardiovascular:     Rate and Rhythm: Normal rate.     Pulses:          Femoral pulses are 2+ on the right side and 2+ on the left  side.      Popliteal pulses are 2+ on the right side and 0 on the left side.       Posterior tibial pulses are 2+ on the right side.     Comments: Palpable pulses and femorofemoral bypass graft Abdominal:     General: Abdomen is flat.     Palpations: Abdomen is soft.  Musculoskeletal:     Cervical back: Normal range of motion.     Right lower leg: No edema.     Left lower leg: No edema.     Comments: Well-healed left groin wound  Skin:    Capillary Refill: Capillary refill takes 2 to 3 seconds.  Neurological:     General: No focal deficit present.     Mental Status: He is alert.  Psychiatric:        Mood and Affect: Mood normal.     Data: Left Graft #1: left-right fem fem  +--------------------+--------+--------+---------+----------------------+                     PSV cm/sStenosisWaveform Comments                +--------------------+--------+--------+---------+----------------------+  Inflow             102             biphasic                         +--------------------+--------+--------+---------+----------------------+  Proximal Anastomosis140             biphasic                         +--------------------+--------+--------+---------+----------------------+  Proximal Graft      56              biphasic Soft hypoechoic plaque  +--------------------+--------+--------+---------+----------------------+  Mid Graft           29              triphasicSoft hypoechoic plaque  +--------------------+--------+--------+---------+----------------------+  Distal Graft        34              triphasic                        +--------------------+--------+--------+---------+----------------------+  Distal Anastomosis  91              triphasic                        +--------------------+--------+--------+---------+----------------------+  Outflow            112             triphasic                         +--------------------+--------+--------+---------+----------------------+    Summary:  Left: Soft hypoechoic plaque noted in the prox-mid graft. Otherwise bypass  appears patent.   RIGHT      PSV cm/sRatioStenosisWaveform Comments  +-----------+--------+-----+--------+---------+--------+  CFA Distal 115                  triphasic          +-----------+--------+-----+--------+---------+--------+  DFA       94                   biphasic           +-----------+--------+-----+--------+---------+--------+  SFA Prox   122                  biphasic           +-----------+--------+-----+--------+---------+--------+  SFA Mid    134                  biphasic           +-----------+--------+-----+--------+---------+--------+  SFA Distal 119                  biphasic           +-----------+--------+-----+--------+---------+--------+  POP Prox   79                   biphasic           +-----------+--------+-----+--------+---------+--------+  POP Distal 68                   biphasic           +-----------+--------+-----+--------+---------+--------+  TP Trunk   94                   biphasic           +-----------+--------+-----+--------+---------+--------+  ATA Distal 33                   biphasic           +-----------+--------+-----+--------+---------+--------+  PTA Distal 117                  triphasic          +-----------+--------+-----+--------+---------+--------+  PERO Distal54                   biphasic           +-----------+--------+-----+--------+---------+--------+   Summary:  Right: Patent lower extremity without evidence of stenosis.  ABI Findings:  +---------+------------------+-----+-----------+--------+  Right   Rt Pressure (mmHg)IndexWaveform   Comment   +---------+------------------+-----+-----------+--------+  Brachial 147                                          +---------+------------------+-----+-----------+--------+  PTA     150               1.02 multiphasic          +---------+------------------+-----+-----------+--------+  DP      141               0.96 multiphasic          +---------+------------------+-----+-----------+--------+  Great Toe85                0.58                      +---------+------------------+-----+-----------+--------+   +---------+------------------+-----+-----------+-------+  Left    Lt Pressure (mmHg)IndexWaveform   Comment  +---------+------------------+-----+-----------+-------+  Brachial 146                                        +---------+------------------+-----+-----------+-------+  PTA     143               0.97 multiphasic         +---------+------------------+-----+-----------+-------+  DP      145               0.99 multiphasic         +---------+------------------+-----+-----------+-------+  Great Toe44                0.30                     +---------+------------------+-----+-----------+-------+   +-------+-----------+-----------+------------+------------+  ABI/TBIToday's ABIToday's TBIPrevious ABIPrevious TBI  +-------+-----------+-----------+------------+------------+  Right 1.02       0.58       1.10        0.87          +-------+-----------+-----------+------------+------------+  Left  0.99       0.30       1.00        0.69          +-------+-----------+-----------+------------+------------+       Bilateral ABIs appear essentially unchanged compared to prior study on  08/25/23.    Summary:  Right: Resting right ankle-brachial index indicates mild right lower  extremity arterial disease. The right toe-brachial index is abnormal.   Left: Resting left ankle-brachial index is within normal range. The left  toe-brachial index is abnormal.     Assessment/Plan:     78 year old male with a complex peripheral vascular  history as above.  His left groin wound is now healed well and he has palpable pulse at the right posterior tibial at the ankle.  As such we will follow him back up in 6 months with repeat noninvasive studies.  He continues on Xarelto  and will follow-up with cardiology for shortness of breath with recent chest pain.    Penne Lonni Colorado MD [facility]

## 2023-12-07 ENCOUNTER — Other Ambulatory Visit: Payer: Self-pay

## 2023-12-07 ENCOUNTER — Telehealth (HOSPITAL_COMMUNITY): Payer: Self-pay

## 2023-12-07 DIAGNOSIS — I739 Peripheral vascular disease, unspecified: Secondary | ICD-10-CM

## 2023-12-07 DIAGNOSIS — I70213 Atherosclerosis of native arteries of extremities with intermittent claudication, bilateral legs: Secondary | ICD-10-CM

## 2023-12-07 NOTE — Telephone Encounter (Signed)
 Attempted to contact the patient to reschedule appointment.  No answer.  Left message.  Second Attempt. Provided  contact number for scheduling: [phone].   Canceled appointment due to no contact, and to avoid errors at check in.

## 2023-12-08 ENCOUNTER — Ambulatory Visit

## 2023-12-08 DIAGNOSIS — I10 Essential (primary) hypertension: Secondary | ICD-10-CM | POA: Diagnosis not present

## 2023-12-08 DIAGNOSIS — E7800 Pure hypercholesterolemia, unspecified: Secondary | ICD-10-CM | POA: Diagnosis not present

## 2023-12-08 DIAGNOSIS — E119 Type 2 diabetes mellitus without complications: Secondary | ICD-10-CM | POA: Diagnosis not present

## 2023-12-09 LAB — BASIC METABOLIC PANEL WITH GFR
BUN/Creatinine Ratio: 15 (ref 10–24)
BUN: 20 mg/dL (ref 8–27)
CO2: 20 mmol/L (ref 20–29)
Calcium: 9.4 mg/dL (ref 8.6–10.2)
Chloride: 106 mmol/L (ref 96–106)
Creatinine, Ser: 1.33 mg/dL — ABNORMAL HIGH (ref 0.76–1.27)
Glucose: 180 mg/dL — ABNORMAL HIGH (ref 70–99)
Potassium: 4.8 mmol/L (ref 3.5–5.2)
Sodium: 141 mmol/L (ref 134–144)
eGFR: 55 mL/min/1.73 — ABNORMAL LOW (ref >59)

## 2023-12-09 LAB — ALT: ALT: 8 IU/L (ref 0–44)

## 2023-12-09 LAB — AST: AST: 15 IU/L (ref 0–40)

## 2023-12-09 LAB — LIPID PANEL
Chol/HDL Ratio: 2.7 ratio (ref 0.0–5.0)
Cholesterol, Total: 85 mg/dL — ABNORMAL LOW (ref 100–199)
HDL: 32 mg/dL — ABNORMAL LOW (ref >39)
LDL Chol Calc (NIH): 34 mg/dL (ref 0–99)
Triglycerides: 98 mg/dL (ref 0–149)
VLDL Cholesterol Cal: 19 mg/dL (ref 5–40)

## 2023-12-09 LAB — HEMOGLOBIN A1C
Est. average glucose Bld gHb Est-mCnc: 255 mg/dL
Hgb A1c MFr Bld: 10.5 % — ABNORMAL HIGH (ref 4.8–5.6)

## 2023-12-11 ENCOUNTER — Other Ambulatory Visit: Payer: Self-pay | Admitting: Internal Medicine

## 2023-12-13 ENCOUNTER — Other Ambulatory Visit: Payer: Self-pay

## 2023-12-13 ENCOUNTER — Ambulatory Visit: Payer: Self-pay | Admitting: Cardiology

## 2023-12-13 ENCOUNTER — Ambulatory Visit: Admitting: Internal Medicine

## 2023-12-13 ENCOUNTER — Encounter: Payer: Self-pay | Admitting: Internal Medicine

## 2023-12-13 VITALS — BP 144/67 | HR 76 | Temp 97.6°F | Ht 67.0 in | Wt 179.0 lb

## 2023-12-13 DIAGNOSIS — B9561 Methicillin susceptible Staphylococcus aureus infection as the cause of diseases classified elsewhere: Secondary | ICD-10-CM | POA: Diagnosis not present

## 2023-12-13 DIAGNOSIS — E1165 Type 2 diabetes mellitus with hyperglycemia: Secondary | ICD-10-CM

## 2023-12-13 DIAGNOSIS — Z79899 Other long term (current) drug therapy: Secondary | ICD-10-CM | POA: Diagnosis not present

## 2023-12-13 DIAGNOSIS — Z794 Long term (current) use of insulin: Secondary | ICD-10-CM

## 2023-12-13 DIAGNOSIS — E1159 Type 2 diabetes mellitus with other circulatory complications: Secondary | ICD-10-CM

## 2023-12-13 DIAGNOSIS — T827XXD Infection and inflammatory reaction due to other cardiac and vascular devices, implants and grafts, subsequent encounter: Secondary | ICD-10-CM | POA: Diagnosis not present

## 2023-12-13 NOTE — Progress Notes (Signed)
 Patient ID: [name], male   DOB: 1945-07-17, 78 y.o.   MRN: [id number]  HPI   [name] is a 78yo M with hx of MSSA vascular graft infection on 5/10, Treated with cefazolin  x 6 wk (10/10/23) then transition to cefadroxil  x 4.5 months. Rifampin not added due to drug interaction. He has not missed any doses. Overall graft healing well.    is a 78 yo M with hospital follow-up of MSSA vascular graft infection.  Discharged on cefazolin  x 6 weeks.  Rifampin was not added due to drug-drug interaction with blood thinner.  Plan was to transition to cefadroxil  for 4.5 months to finish out treatment course. - Today 10/11/2023 he presents with his girlfriend and daughter.  He reports that tolerating antibiotics without missed doses.  Overall graft is healing and patient is doing well. The only thing that he reports is that his BS running high despite 60 u of lantus , plus novolog  12 units TID. Last hemoglobin A1c at 10.8. has not had any increase in dosage by his PCP but has not yet been referred to endocrinologist Review of Systems  All other systems reviewed and are negative.   Lab Results  Component Value Date   ESRSEDRATE 49 (H) 08/31/2023   Lab Results  Component Value Date   CRP 1.4 (H) 08/31/2023     Outpatient Encounter Medications as of 12/13/2023  Medication Sig   amLODipine  (NORVASC ) 5 MG tablet TAKE 1 TABLET(5 MG) BY MOUTH DAILY   aspirin  EC 81 MG tablet Take 1 tablet (81 mg total) by mouth daily. Swallow whole.   carvedilol  (COREG ) 12.5 MG tablet Take 1 tablet (12.5 mg total) by mouth 2 (two) times daily with a meal.   cefadroxil  (DURICEF) 500 MG capsule Take 2 capsules (1,000 mg total) by mouth 2 (two) times daily.   cetirizine  (ZYRTEC ) 10 MG tablet Take 1 tablet (10 mg total) by mouth daily.   cyclobenzaprine  (FLEXERIL ) 10 MG tablet Take 1 tablet (10 mg total) by mouth 3 (three) times daily as needed for muscle spasms.   ezetimibe  (ZETIA ) 10 MG tablet Take 1 tablet (10 mg  total) by mouth daily.   glucose blood (PRODIGY NO CODING BLOOD GLUC) test strip AS DIRECTED   insulin  aspart (NOVOLOG ) 100 UNIT/ML FlexPen Inject 12 Units into the skin 3 (three) times daily with meals.   insulin  glargine (LANTUS ) 100 unit/mL SOPN 46 units subcut daily at nighttime   isosorbide  dinitrate (ISORDIL ) 10 MG tablet Take 1 tablet (10 mg total) by mouth 2 (two) times daily.   JARDIANCE  10 MG TABS tablet Take 1 tablet (10 mg total) by mouth daily.   loperamide  (IMODIUM  A-D) 2 MG tablet Take 2 mg by mouth 4 (four) times daily as needed for diarrhea or loose stools.   nitroGLYCERIN  (NITROSTAT ) 0.4 MG SL tablet Place 0.4 mg under the tongue every 5 (five) minutes as needed for chest pain.   oxyCODONE -acetaminophen  (PERCOCET) 5-325 MG tablet Take 1 tablet by mouth every 6 (six) hours as needed for severe pain (pain score 7-10).   rivaroxaban  (XARELTO ) 20 MG TABS tablet Take 1 tablet (20 mg total) by mouth daily with supper.   rosuvastatin  (CRESTOR ) 40 MG tablet TAKE 1 TABLET(40 MG) BY MOUTH DAILY   tamsulosin  (FLOMAX ) 0.4 MG CAPS capsule Take 0.8 mg by mouth every evening.   No facility-administered encounter medications on file as of 12/13/2023.     Patient Active Problem List   Diagnosis Date Noted  Poorly controlled diabetes mellitus (HCC) 09/27/2023   Staphylococcus aureus infection 08/31/2023   GERD (gastroesophageal reflux disease) 08/28/2023   BPH (benign prostatic hyperplasia) 08/28/2023   Infection of vascular bypass graft (HCC) 08/25/2023   Arthritis    PAD (peripheral artery disease) (HCC) 07/17/2023   Cellulitis 07/17/2023   Acute lower limb ischemia 07/11/2023   Peripheral artery disease (HCC) 07/09/2023   CKD stage 3b, GFR 30-44 ml/min (HCC) 07/03/2023   Sigmoid diverticulitis 06/30/2023   Chronic left shoulder pain 05/24/2023   Trigger ring finger of right hand 03/29/2023   Dyssynergic defecation 03/21/2023   History of colonic diverticulitis 03/21/2023   Status  post Hartmann's procedure (HCC) 03/21/2023   Fatigue 03/13/2023   Benign prostatic hyperplasia with urinary frequency 02/23/2023   PVD (peripheral vascular disease) (HCC) 02/23/2023   History of bladder cancer 02/23/2023   BMI 27.0-27.9,adult 02/23/2023   Hypercholesterolemia 10/28/2022   Rotator cuff arthropathy of right shoulder 09/27/2022   Chronic right shoulder pain 09/02/2022   Type 2 diabetes mellitus with other circulatory complications (HCC) 07/22/2022   Paroxysmal atrial fibrillation (HCC) 07/22/2022   Essential hypertension 07/22/2022   Acute pain of both shoulders 07/22/2022   Chronic systolic CHF (congestive heart failure) (HCC) 07/22/2022   Chronic combined systolic (congestive) and diastolic (congestive) heart failure (HCC) 07/22/2022   AKI (acute kidney injury) (HCC) 05/24/2022   Hypothyroidism 05/24/2022   Need for prophylactic vaccination and inoculation against influenza 03/28/2022   Lethargy 03/28/2022   Bladder cancer (HCC) 02/09/2022   Coronary artery disease 02/09/2022   Ischemic cardiomyopathy 02/09/2022   Lumbar spinal stenosis 02/09/2022   Myocardial infarction (HCC) 02/09/2022   NSVT (nonsustained ventricular tachycardia) (HCC) 02/09/2022   PAF (paroxysmal atrial fibrillation) (HCC) 02/09/2022   PVC's (premature ventricular contractions) 02/09/2022   DM2 (diabetes mellitus, type 2) (HCC) 02/09/2022   Diverticulitis large intestine s/p LAR/colostomy takedown 06/30/2023 05/11/2021   Diverticulitis of sigmoid colon 05/11/2021   Status post coronary artery stent placement    Acute low back pain 01/12/2015   Atherosclerosis of native artery of extremity with intermittent claudication (HCC) 08/13/2013   Backache 02/08/2013   TOBACCO ABUSE 05/29/2009   HYPERLIPIDEMIA-MIXED 09/22/2008   PVD 09/22/2008     Health Maintenance Due  Topic Date Due   COVID-19 Vaccine (1) Never done   OPHTHALMOLOGY EXAM  Never done   Hepatitis C Screening  Never done    DTaP/Tdap/Td (1 - Tdap) Never done   Zoster Vaccines- Shingrix (1 of 2) Never done   Pneumococcal Vaccine: 50+ Years (2 of 2 - PCV) 04/13/2022   Lung Cancer Screening  05/26/2023   INFLUENZA VACCINE  11/17/2023     Review of Systems Review of Systems  Constitutional: Negative for fever, chills, diaphoresis, activity change, appetite change, fatigue and unexpected weight change.  HENT: Negative for congestion, sore throat, rhinorrhea, sneezing, trouble swallowing and sinus pressure.  Eyes: Negative for photophobia and visual disturbance.  Respiratory: Negative for cough, chest tightness, shortness of breath, wheezing and stridor.  Cardiovascular: Negative for chest pain, palpitations and leg swelling.  Gastrointestinal: Negative for nausea, vomiting, abdominal pain, diarrhea, constipation, blood in stool, abdominal distention and anal bleeding.  Genitourinary: Negative for dysuria, hematuria, flank pain and difficulty urinating.  Musculoskeletal: Negative for myalgias, back pain, joint swelling, arthralgias and gait problem.  Skin: Negative for color change, pallor, rash and wound.  Neurological: Negative for dizziness, tremors, weakness and light-headedness.  Hematological: Negative for adenopathy. Does not bruise/bleed easily.  Psychiatric/Behavioral: Negative for behavioral problems,  confusion, sleep disturbance, dysphoric mood, decreased concentration and agitation.   Physical Exam   BP (!) 144/67   Pulse 76   Temp 97.6 F (36.4 C) (Temporal)   Ht 5' 7 (1.702 m)   Wt 179 lb (81.2 kg)   SpO2 96%   BMI 28.04 kg/m   Physical Exam  Constitutional: He is oriented to person, place, and time. He appears well-developed and well-nourished. No distress.  HENT:  Mouth/Throat: Oropharynx is clear and moist. No oropharyngeal exudate.  Cardiovascular: Normal rate, regular rhythm and normal heart sounds. Exam reveals no gallop and no friction rub.  No murmur heard.  Pulmonary/Chest:  Effort normal and breath sounds normal. No respiratory distress. He has no wheezes.  Abdominal: Soft. Bowel sounds are normal. He exhibits no distension. There is no tenderness/incision well healed Neurological: He is alert and oriented to person, place, and time.  Skin: Skin is warm and dry. No rash noted. No erythema.  Psychiatric: He has a normal mood and affect. His behavior is normal.    CBC Lab Results  Component Value Date   WBC 8.9 09/27/2023   RBC 4.23 09/27/2023   HGB 11.4 (L) 09/27/2023   HCT 37.0 (L) 09/27/2023   PLT 279 09/27/2023   MCV 88 09/27/2023   MCH 27.0 09/27/2023   MCHC 30.8 (L) 09/27/2023   RDW 14.4 09/27/2023   LYMPHSABS 1.6 09/27/2023   MONOABS 1.1 (H) 08/21/2023   EOSABS 0.2 09/27/2023    BMET Lab Results  Component Value Date   NA 141 12/08/2023   K 4.8 12/08/2023   CL 106 12/08/2023   CO2 20 12/08/2023   GLUCOSE 180 (H) 12/08/2023   BUN 20 12/08/2023   CREATININE 1.33 (H) 12/08/2023   CALCIUM  9.4 12/08/2023   GFRNONAA >60 09/01/2023   GFRAA 82 02/09/2018   Lab Results  Component Value Date   ESRSEDRATE 22 (H) 12/13/2023   Lab Results  Component Value Date   CRP <3.0 12/13/2023      Assessment and Plan Poorly controlled IDDM/T2DM =Recommend to increase lantus  to 65units at bedtime but likely can increase more given his hgb A1c is 10. States that not getting directions from his pcp, not established with endocrine as of yet  Will refer to endocrine  Mssa graft infection = continue on cefadroxil  1000mg  bid through early November. Then will decide to do chronic suppression at 500mg  po bid.    Long term medication management =Will check sed rate and crp

## 2023-12-14 LAB — SEDIMENTATION RATE: Sed Rate: 22 mm/h — ABNORMAL HIGH (ref 0–20)

## 2023-12-14 LAB — C-REACTIVE PROTEIN: CRP: <3 mg/L (ref <8.0)

## 2023-12-15 ENCOUNTER — Telehealth: Payer: Self-pay

## 2023-12-15 NOTE — Telephone Encounter (Signed)
 Routed to PCP

## 2023-12-22 ENCOUNTER — Telehealth: Payer: Self-pay | Admitting: Cardiology

## 2023-12-22 ENCOUNTER — Observation Stay (HOSPITAL_BASED_OUTPATIENT_CLINIC_OR_DEPARTMENT_OTHER)
Admission: EM | Admit: 2023-12-22 | Discharge: 2023-12-24 | Disposition: A | Discharge status: Home / Self Care | Attending: Internal Medicine | Admitting: Internal Medicine

## 2023-12-22 ENCOUNTER — Emergency Department (HOSPITAL_BASED_OUTPATIENT_CLINIC_OR_DEPARTMENT_OTHER)

## 2023-12-22 ENCOUNTER — Other Ambulatory Visit: Payer: Self-pay

## 2023-12-22 ENCOUNTER — Encounter (HOSPITAL_BASED_OUTPATIENT_CLINIC_OR_DEPARTMENT_OTHER): Payer: Self-pay | Admitting: Urology

## 2023-12-22 DIAGNOSIS — I25110 Atherosclerotic heart disease of native coronary artery with unstable angina pectoris: Secondary | ICD-10-CM | POA: Insufficient documentation

## 2023-12-22 DIAGNOSIS — R079 Chest pain, unspecified: Secondary | ICD-10-CM | POA: Diagnosis not present

## 2023-12-22 DIAGNOSIS — Z955 Presence of coronary angioplasty implant and graft: Secondary | ICD-10-CM | POA: Diagnosis not present

## 2023-12-22 DIAGNOSIS — Z7901 Long term (current) use of anticoagulants: Secondary | ICD-10-CM | POA: Diagnosis not present

## 2023-12-22 DIAGNOSIS — E119 Type 2 diabetes mellitus without complications: Secondary | ICD-10-CM

## 2023-12-22 DIAGNOSIS — N401 Enlarged prostate with lower urinary tract symptoms: Secondary | ICD-10-CM | POA: Diagnosis present

## 2023-12-22 DIAGNOSIS — R0789 Other chest pain: Secondary | ICD-10-CM | POA: Diagnosis not present

## 2023-12-22 DIAGNOSIS — E1122 Type 2 diabetes mellitus with diabetic chronic kidney disease: Secondary | ICD-10-CM | POA: Diagnosis not present

## 2023-12-22 DIAGNOSIS — E7800 Pure hypercholesterolemia, unspecified: Secondary | ICD-10-CM | POA: Diagnosis present

## 2023-12-22 DIAGNOSIS — B9561 Methicillin susceptible Staphylococcus aureus infection as the cause of diseases classified elsewhere: Secondary | ICD-10-CM | POA: Diagnosis not present

## 2023-12-22 DIAGNOSIS — D649 Anemia, unspecified: Secondary | ICD-10-CM | POA: Diagnosis present

## 2023-12-22 DIAGNOSIS — I739 Peripheral vascular disease, unspecified: Secondary | ICD-10-CM | POA: Diagnosis present

## 2023-12-22 DIAGNOSIS — I480 Paroxysmal atrial fibrillation: Secondary | ICD-10-CM | POA: Diagnosis not present

## 2023-12-22 DIAGNOSIS — R0600 Dyspnea, unspecified: Secondary | ICD-10-CM | POA: Diagnosis not present

## 2023-12-22 DIAGNOSIS — I959 Hypotension, unspecified: Secondary | ICD-10-CM | POA: Diagnosis not present

## 2023-12-22 DIAGNOSIS — Z79899 Other long term (current) drug therapy: Secondary | ICD-10-CM | POA: Insufficient documentation

## 2023-12-22 DIAGNOSIS — I130 Hypertensive heart and chronic kidney disease with heart failure and stage 1 through stage 4 chronic kidney disease, or unspecified chronic kidney disease: Secondary | ICD-10-CM | POA: Diagnosis not present

## 2023-12-22 DIAGNOSIS — I5022 Chronic systolic (congestive) heart failure: Secondary | ICD-10-CM | POA: Diagnosis present

## 2023-12-22 DIAGNOSIS — Z794 Long term (current) use of insulin: Secondary | ICD-10-CM | POA: Diagnosis not present

## 2023-12-22 DIAGNOSIS — Z7982 Long term (current) use of aspirin: Secondary | ICD-10-CM | POA: Insufficient documentation

## 2023-12-22 DIAGNOSIS — Y829 Unspecified medical devices associated with adverse incidents: Secondary | ICD-10-CM | POA: Diagnosis not present

## 2023-12-22 DIAGNOSIS — Z87891 Personal history of nicotine dependence: Secondary | ICD-10-CM | POA: Insufficient documentation

## 2023-12-22 DIAGNOSIS — N1831 Chronic kidney disease, stage 3a: Secondary | ICD-10-CM

## 2023-12-22 DIAGNOSIS — N1832 Chronic kidney disease, stage 3b: Secondary | ICD-10-CM | POA: Insufficient documentation

## 2023-12-22 DIAGNOSIS — Z8551 Personal history of malignant neoplasm of bladder: Secondary | ICD-10-CM | POA: Diagnosis not present

## 2023-12-22 DIAGNOSIS — Z743 Need for continuous supervision: Secondary | ICD-10-CM | POA: Diagnosis not present

## 2023-12-22 DIAGNOSIS — F1090 Alcohol use, unspecified, uncomplicated: Secondary | ICD-10-CM | POA: Insufficient documentation

## 2023-12-22 DIAGNOSIS — R531 Weakness: Secondary | ICD-10-CM | POA: Diagnosis not present

## 2023-12-22 DIAGNOSIS — R350 Frequency of micturition: Secondary | ICD-10-CM | POA: Insufficient documentation

## 2023-12-22 DIAGNOSIS — T827XXA Infection and inflammatory reaction due to other cardiac and vascular devices, implants and grafts, initial encounter: Secondary | ICD-10-CM | POA: Diagnosis not present

## 2023-12-22 LAB — OCCULT BLOOD X 1 CARD TO LAB, STOOL: Fecal Occult Bld: NEGATIVE

## 2023-12-22 LAB — CBC
HCT: 26.5 % — ABNORMAL LOW (ref 39.0–52.0)
Hemoglobin: 7.8 g/dL — ABNORMAL LOW (ref 13.0–17.0)
MCH: 22.3 pg — ABNORMAL LOW (ref 26.0–34.0)
MCHC: 29.4 g/dL — ABNORMAL LOW (ref 30.0–36.0)
MCV: 75.7 fL — ABNORMAL LOW (ref 80.0–100.0)
Platelets: 269 K/uL (ref 150–400)
RBC: 3.5 MIL/uL — ABNORMAL LOW (ref 4.22–5.81)
RDW: 16.8 % — ABNORMAL HIGH (ref 11.5–15.5)
WBC: 5.3 K/uL (ref 4.0–10.5)
nRBC: 0 % (ref 0.0–0.2)

## 2023-12-22 LAB — PRO BRAIN NATRIURETIC PEPTIDE: Pro Brain Natriuretic Peptide: 1531 pg/mL — ABNORMAL HIGH (ref <300.0)

## 2023-12-22 LAB — BASIC METABOLIC PANEL WITH GFR
Anion gap: 12 (ref 5–15)
BUN: 16 mg/dL (ref 8–23)
CO2: 22 mmol/L (ref 22–32)
Calcium: 8.9 mg/dL (ref 8.9–10.3)
Chloride: 103 mmol/L (ref 98–111)
Creatinine, Ser: 1.32 mg/dL — ABNORMAL HIGH (ref 0.61–1.24)
GFR, Estimated: 55 mL/min — ABNORMAL LOW (ref >60)
Glucose, Bld: 360 mg/dL — ABNORMAL HIGH (ref 70–99)
Potassium: 4.4 mmol/L (ref 3.5–5.1)
Sodium: 136 mmol/L (ref 135–145)

## 2023-12-22 LAB — HEMOGLOBIN AND HEMATOCRIT, BLOOD
HCT: 26.1 % — ABNORMAL LOW (ref 39.0–52.0)
Hemoglobin: 7.4 g/dL — ABNORMAL LOW (ref 13.0–17.0)

## 2023-12-22 LAB — PREPARE RBC (CROSSMATCH)

## 2023-12-22 LAB — TROPONIN T, HIGH SENSITIVITY
Troponin T High Sensitivity: 24 ng/L — ABNORMAL HIGH (ref 0–19)
Troponin T High Sensitivity: 22 ng/L — ABNORMAL HIGH (ref 0–19)

## 2023-12-22 MED ORDER — ONDANSETRON HCL 4 MG/2ML IJ SOLN: 4.0000 mg | Freq: Four times a day (QID) | INTRAMUSCULAR | Status: DC | PRN

## 2023-12-22 MED ORDER — BISACODYL 5 MG PO TBEC: 5.0000 mg | DELAYED_RELEASE_TABLET | Freq: Every day | ORAL | Status: DC | PRN

## 2023-12-22 MED ORDER — SODIUM CHLORIDE 0.9% IV SOLUTION: Freq: Once | INTRAVENOUS | Status: AC

## 2023-12-22 MED ORDER — ONDANSETRON HCL 4 MG PO TABS: 4.0000 mg | ORAL_TABLET | Freq: Four times a day (QID) | ORAL | Status: DC | PRN

## 2023-12-22 MED ORDER — ACETAMINOPHEN 325 MG PO TABS: 650.0000 mg | ORAL_TABLET | Freq: Four times a day (QID) | ORAL | Status: DC | PRN

## 2023-12-22 MED ORDER — CEFADROXIL 500 MG PO CAPS
1000.0000 mg | ORAL_CAPSULE | Freq: Two times a day (BID) | ORAL | Status: DC
  Administered 2023-12-23 – 2023-12-24 (×3): 1000 mg via ORAL
  Filled 2023-12-22 (×3): qty 2

## 2023-12-22 MED ORDER — ACETAMINOPHEN 650 MG RE SUPP
650.0000 mg | Freq: Four times a day (QID) | RECTAL | Status: DC | PRN
Start: 2023-12-22 — End: 2023-12-24

## 2023-12-22 MED ORDER — RIVAROXABAN 20 MG PO TABS
20.0000 mg | ORAL_TABLET | Freq: Every day | ORAL | Status: DC
  Administered 2023-12-23: 20 mg via ORAL
  Filled 2023-12-22: qty 1

## 2023-12-22 MED ORDER — MELATONIN 3 MG PO TABS: 3.0000 mg | ORAL_TABLET | Freq: Every evening | ORAL | Status: DC | PRN

## 2023-12-22 NOTE — Progress Notes (Signed)
 Pt. Arrived to unit in alert and stable condition. Pt. Placed on telemetry, CCMD notified. VSS. No distress noted. TRH admit. Notified of pts. Arrival to the unit.

## 2023-12-22 NOTE — ED Notes (Signed)
 Patient transported to X-ray

## 2023-12-22 NOTE — Telephone Encounter (Signed)
 Pt c/o of Chest Pain: STAT if active CP, including tightness, pressure, jaw pain, radiating pain to shoulder/upper arm/back, CP unrelieved by Nitro. Symptoms reported of SOB, nausea, vomiting, sweating.  1. Are you having CP right now? no   2. Are you experiencing any other symptoms (ex. SOB, nausea, vomiting, sweating)? SOB. Within 5 mins of doing any type of activity he has to sit down and gets SOB.   3. Is your CP continuous or coming and going? It comes and goes with activity.   4. Have you taken Nitroglycerin ? no  5. How long have you been experiencing CP? Has been going on for about a week.    6. If NO CP at time of call then end call with telling Pt to call back or call 911 if Chest pain returns prior to return call from triage team.

## 2023-12-22 NOTE — Plan of Care (Signed)
   Problem: Education: Goal: Knowledge of General Education information will improve Description: Including pain rating scale, medication(s)/side effects and non-pharmacologic comfort measures Outcome: Progressing   Problem: Coping: Goal: Level of anxiety will decrease Outcome: Progressing

## 2023-12-22 NOTE — ED Triage Notes (Signed)
 Pt states chest pain x 1 week  Worsening this am with SOB with exertion  Cardiology instructed him to come to ER    H/o stents

## 2023-12-22 NOTE — Telephone Encounter (Signed)
LVM to call regarding message left.

## 2023-12-22 NOTE — H&P (Addendum)
 History and Physical    Patient: [name] FMW:[id number] DOB: 1946-03-22 DOA: 12/22/2023 DOS: the patient was seen and examined on 12/22/2023 PCP: Fleeta Valeria Mayo, MD  Patient coming from: Home  Chief Complaint:  Chief Complaint  Patient presents with   Chest Pain   HPI: [name] is a 78 y.o. male with medical history significant for ischemic limb treated with mechanical thrombectomy and right SFA drug coated balloon angioplasty on 07/10/2023.  He aslo has Paroxysmal atrial fibrillation on Coumadin, hyperlipidemia, hypertension, and ischemic cardiomyopathy with EF of 25%.  On 08/25/2023 he was found to have evidence of graft infection.  He was taken to the operating room and had a washout of the left groin and had antibiotic beads placed.  The wound was not completely closed and he had packing and wound VAC placed. He underwent another washout of the left groin on 5/13/205.  IV Ancef  was started based on culture results. For the last 2 weeks the patient has noticed increased shortness of breath and sometimes even some chest pain with light exertion.  This has not stopped him from his activities. It just requires that he stop more frequently or go slower.  He actually went camping last night..  He says he had to stop 3 times to catch his breath in the walk of about 100 feet.  This morning while back at home the patient did not feel well and had some chest discomfort while sitting in the recliner.  He called his cardiologist who recommended that he present to the hospital. His workup in the emergency department included CBC which revealed a hemoglobin of 7.9.  His hemoglobin 4 months ago was 9.7.  His troponin was not significantly elevated at 24.  He is chest pain free at rest.  The patient denies any nosebleeds or bright red blood per rectum or vomiting any blood.  He has not seen any blood at all.  He does not think he has even ever had a transfusion before. He does not have shortness of breath  at rest and he has been compliant with his medication. He was hemoccult negative by the ED doctor.   Review of Systems: As mentioned in the history of present illness. All other systems reviewed and are negative. Past Medical History:  Diagnosis Date   Acute low back pain 01/12/2015   Acute lower limb ischemia 07/11/2023   Acute pain of both shoulders 07/22/2022   AKI (acute kidney injury) (HCC) 05/24/2022   Arthritis    Atherosclerosis of native arteries of the extremities with intermittent claudication 08/13/2013   Atherosclerosis of native artery of extremity with intermittent claudication (HCC) 08/13/2013   IMO SNOMED Dx Update Oct 2024     Backache 02/08/2013   Benign prostatic hyperplasia with urinary frequency 02/23/2023   Bladder cancer (HCC)    resection x3   BMI 27.0-27.9,adult 02/23/2023   Cellulitis 07/17/2023   Chronic combined systolic (congestive) and diastolic (congestive) heart failure (HCC) 07/22/2022   Chronic left shoulder pain 05/24/2023   Chronic right shoulder pain 09/02/2022   Chronic systolic CHF (congestive heart failure) (HCC)    CKD stage 3b, GFR 30-44 ml/min (HCC) 07/03/2023   Coronary artery disease    status post DMI RX Taxus stent RCA 2006 with susequent Stent LAD and subsequent  stent thrombosis RCA unable to be opened 2006 -neg mv 10/2008, 10/16/17 ISR to pLAD with PTCA/DES, CTO of RCA with collaterals, EF 25%   Coronary artery disease involving  native coronary artery of native heart with unstable angina pectoris (HCC)    Current moderate episode of major depressive disorder without prior episode (HCC) 03/28/2022   Diverticulitis 06/10/2022   Diverticulitis large intestine 05/11/2021   Diverticulitis of large intestine with abscess 05/24/2022   Diverticulitis of sigmoid colon 05/11/2021   DM2 (diabetes mellitus, type 2) (HCC) 02/09/2022   status post bilateral aortobifemoral bypass [id number] with recent fem to fembypass April  2011 per DR. Early      Dyssynergic defecation 03/21/2023   Essential hypertension 07/22/2022   Fatigue 03/13/2023   History of bladder cancer 02/23/2023   History of colonic diverticulitis 03/21/2023   Hypercholesterolemia 10/28/2022   HYPERLIPIDEMIA-MIXED 09/22/2008   Qualifier: Diagnosis of  By: Justina Kos     Hypothyroidism 05/24/2022   Ischemic cardiomyopathy    ejection fraction of 40-45%   Lethargy 03/28/2022   Lumbar spinal stenosis    Myocardial infarction (HCC)    I've had 4 (10/16/2017)   Need for prophylactic vaccination and inoculation against influenza 03/28/2022   NSVT (nonsustained ventricular tachycardia) (HCC)    PAD (peripheral artery disease) (HCC) 07/17/2023   PAF (paroxysmal atrial fibrillation) (HCC) 02/09/2022   Paroxysmal atrial fibrillation (HCC)    Peripheral arterial disease (HCC)    Peripheral artery disease (HCC) 07/09/2023   Poorly controlled T2 diabetes mellitus (HCC) 09/27/2022   Protein-calorie malnutrition, severe 05/29/2022   PVC's (premature ventricular contractions)    PVD 09/22/2008   Qualifier: Diagnosis of  By: Justina Kos     PVD (peripheral vascular disease) (HCC) 02/23/2023   Rotator cuff arthropathy of right shoulder 09/27/2022   Sigmoid diverticulitis 06/30/2023   Status post coronary artery stent placement    Status post Hartmann's procedure (HCC) 03/21/2023   TOBACCO ABUSE 05/29/2009   Qualifier: Diagnosis of  By: Velinda, RN, BSN, Avelina CROME    Trigger ring finger of right hand 03/29/2023   Type 2 diabetes mellitus with other circulatory complications (HCC) 07/22/2022   Type II diabetes mellitus (HCC)    Unstable angina (HCC) 10/16/2017   Past Surgical History:  Procedure Laterality Date   ABDOMINAL AORTOGRAM W/LOWER EXTREMITY Right 07/10/2023   Procedure: ABDOMINAL AORTOGRAM W/LOWER EXTREMITY;  Surgeon: Pearline Norman RAMAN, MD;  Location: [facility] INVASIVE CV LAB;  Service: Cardiovascular;  Laterality: Right;   AORTA - BILATERAL FEMORAL ARTERY BYPASS  GRAFT Bilateral 1992   APPENDECTOMY     APPLICATION OF WOUND VAC Left 08/26/2023   Procedure: APPLICATION, WOUND VAC left groin;  Surgeon: Sheree Penne Bruckner, MD;  Location: [facility] OR;  Service: Vascular;  Laterality: Left;   APPLICATION, SKIN SUBSTITUTE Left 08/29/2023   Procedure: APPLICATION, SKIN SUBSTITUTE;  Surgeon: Sheree Penne Bruckner, MD;  Location: [facility] OR;  Service: Vascular;  Laterality: Left;   BACK SURGERY     COLECTOMY WITH COLOSTOMY CREATION/HARTMANN PROCEDURE N/A 05/26/2022   Procedure: COLECTOMY WITH COLOSTOMY CREATION/HARTMANN PROCEDURE;  Surgeon: Paola Dreama SAILOR, MD;  Location: [facility] OR;  Service: General;  Laterality: N/A;   CORONARY ANGIOPLASTY WITH STENT PLACEMENT     taxus stent  placed into his right coronary artery; stent placed to the LAD.     CORONARY STENT INTERVENTION N/A 10/16/2017   Procedure: CORONARY STENT INTERVENTION;  Surgeon: Verlin Bruckner BIRCH, MD;  Location: [facility] INVASIVE CV LAB;  Service: Cardiovascular;  Laterality: N/A;   FEMORAL-FEMORAL BYPASS GRAFT  07/2009   INCISION AND DRAINAGE OF WOUND Left 08/26/2023   Procedure: IRRIGATION AND DEBRIDEMENT left groin  WOUND, application of antibiotic beads;  Surgeon: Sheree Penne Bruckner, MD;  Location: [facility] OR;  Service: Vascular;  Laterality: Left;  WASHOUT LEFT GROIN   INCISION AND DRAINAGE OF WOUND Left 08/29/2023   Procedure: IRRIGATION AND DEBRIDEMENT WOUND;  Surgeon: Sheree Penne Bruckner, MD;  Location: [facility] OR;  Service: Vascular;  Laterality: Left;  WASHOUT LEFT GROIN REPEAT   LAPAROTOMY N/A 05/26/2022   Procedure: EXPLORATORY LAPAROTOMY;  Surgeon: Paola Dreama SAILOR, MD;  Location: [facility] OR;  Service: General;  Laterality: N/A;   LOWER EXTREMITY INTERVENTION Right 07/10/2023   Procedure: LOWER EXTREMITY INTERVENTION;  Surgeon: Pearline Norman RAMAN, MD;  Location: [facility] INVASIVE CV LAB;  Service: Cardiovascular;  Laterality: Right;   LUMBAR MICRODISCECTOMY  12/31/08   LYSIS OF ADHESION N/A 06/30/2023   Procedure:  LAPAROSCOPIC LYSIS OF ADHESIONS;  Surgeon: Sheldon Standing, MD;  Location: [facility] ORS;  Service: General;  Laterality: N/A;   PATCH ANGIOPLASTY Left 08/26/2023   Procedure: Primary repair of left femoral graftotomy;  Surgeon: Sheree Penne Bruckner, MD;  Location: [facility] OR;  Service: Vascular;  Laterality: Left;   PERIPHERAL VASCULAR THROMBECTOMY Right 07/10/2023   Procedure: PERIPHERAL VASCULAR THROMBECTOMY;  Surgeon: Pearline Norman RAMAN, MD;  Location: [facility] INVASIVE CV LAB;  Service: Cardiovascular;  Laterality: Right;   PROCTOSCOPY N/A 06/30/2023   Procedure: Flexible sigmoidoscopy;  Surgeon: Sheldon Standing, MD;  Location: [facility] ORS;  Service: General;  Laterality: N/A;   RIGHT/LEFT HEART CATH AND CORONARY ANGIOGRAPHY N/A 10/16/2017   Procedure: RIGHT/LEFT HEART CATH AND CORONARY ANGIOGRAPHY;  Surgeon: Verlin Bruckner BIRCH, MD;  Location: [facility] INVASIVE CV LAB;  Service: Cardiovascular;  Laterality: N/A;   SHOULDER OPEN ROTATOR CUFF REPAIR Left    VACUUM ASSISTED CLOSURE CHANGE Left 08/29/2023   Procedure: REPLACEMENT, WOUND VAC DRESSING, GROIN;  Surgeon: Sheree Penne Bruckner, MD;  Location: [facility] OR;  Service: Vascular;  Laterality: Left;   XI ROBOTIC ASSISTED COLOSTOMY TAKEDOWN N/A 06/30/2023   Procedure: XI ROBOTIC ASSISTED OSTOMY TAKEDOWN, PARTIAL RECTOSIGMOID RESECTION, BILATERAL TAP BLOCK;  Surgeon: Sheldon Standing, MD;  Location: [facility] ORS;  Service: General;  Laterality: N/A;   Social History:  reports that he quit smoking about 3 years ago. His smoking use included cigars and cigarettes. He started smoking about 59 years ago. He has a 28 pack-year smoking history. He has quit using smokeless tobacco.  His smokeless tobacco use included chew. He reports current alcohol use. He reports that he does not use drugs.  Allergies  Allergen Reactions   Lisinopril  Swelling and Rash    Rash - face and tounge swell   Levaquin [Levofloxacin]     Family History  Problem Relation Age of Onset   Coronary artery disease  Unknown    Diabetes Unknown    Cardiomyopathy Mother    Heart disease Mother    Hyperlipidemia Mother    Coronary artery disease Father    Stroke Father    Diabetes Father    Heart disease Father        before age 26   Hyperlipidemia Father    Heart attack Father    Diabetes Sister    Heart disease Sister        before age 30   Hyperlipidemia Sister    Heart attack Sister     Prior to Admission medications   Medication Sig Start Date End Date Taking? Authorizing Provider  amLODipine  (NORVASC ) 5 MG tablet TAKE 1 TABLET(5 MG) BY MOUTH DAILY 11/13/23   Krasowski, Robert J, MD  aspirin  EC 81 MG tablet Take 1 tablet (81  mg total) by mouth daily. Swallow whole. 05/10/21   Verlin Lonni BIRCH, MD  carvedilol  (COREG ) 12.5 MG tablet Take 1 tablet (12.5 mg total) by mouth 2 (two) times daily with a meal. 01/24/23   Bernie Lamar PARAS, MD  cefadroxil  (DURICEF) 500 MG capsule Take 2 capsules (1,000 mg total) by mouth 2 (two) times daily. 10/05/23   Dennise Kingsley, MD  cetirizine  (ZYRTEC ) 10 MG tablet Take 1 tablet (10 mg total) by mouth daily. 02/09/23   Fleeta Valeria Mayo, MD  cyclobenzaprine  (FLEXERIL ) 10 MG tablet Take 1 tablet (10 mg total) by mouth 3 (three) times daily as needed for muscle spasms. 05/24/23   Fleeta Valeria Mayo, MD  ezetimibe  (ZETIA ) 10 MG tablet Take 1 tablet (10 mg total) by mouth daily. 03/01/23 02/24/24  Krasowski, Robert J, MD  glucose blood (PRODIGY NO CODING BLOOD GLUC) test strip AS DIRECTED 12/11/23   Fleeta Valeria, Mayo, MD  insulin  aspart (NOVOLOG ) 100 UNIT/ML FlexPen Inject 12 Units into the skin 3 (three) times daily with meals. 09/01/23   Baglia, Corrina, PA-C  insulin  glargine (LANTUS ) 100 unit/mL SOPN 46 units subcut daily at nighttime 10/05/23   Fleeta Valeria Mayo, MD  isosorbide  dinitrate (ISORDIL ) 10 MG tablet Take 1 tablet (10 mg total) by mouth 2 (two) times daily. 09/15/23   Carlin Delon BROCKS, NP  JARDIANCE  10 MG TABS tablet Take 1 tablet (10 mg total) by mouth daily. 06/05/23    Fleeta Valeria Mayo, MD  loperamide  (IMODIUM  A-D) 2 MG tablet Take 2 mg by mouth 4 (four) times daily as needed for diarrhea or loose stools.    [provider]  nitroGLYCERIN  (NITROSTAT ) 0.4 MG SL tablet Place 0.4 mg under the tongue every 5 (five) minutes as needed for chest pain.    [provider]  oxyCODONE -acetaminophen  (PERCOCET) 5-325 MG tablet Take 1 tablet by mouth every 6 (six) hours as needed for severe pain (pain score 7-10). 09/01/23 08/31/24  Baglia, Corrina, PA-C  rivaroxaban  (XARELTO ) 20 MG TABS tablet Take 1 tablet (20 mg total) by mouth daily with supper. 12/23/22   Fleeta Valeria Mayo, MD  rosuvastatin  (CRESTOR ) 40 MG tablet TAKE 1 TABLET(40 MG) BY MOUTH DAILY 07/31/23   Fleeta Valeria, Mayo, MD  tamsulosin  (FLOMAX ) 0.4 MG CAPS capsule Take 0.8 mg by mouth every evening. 03/31/23   [provider]    Physical Exam: Vitals:   12/22/23 1955 12/22/23 2048 12/22/23 2049 12/22/23 2050  BP:  (!) 158/66    Pulse: 80  82 80  Resp: 16 18    Temp:  98 F (36.7 C)    TempSrc:  Oral    SpO2: 98% 100% 100% 100%  Weight:      Height:       Physical Exam:  General: No acute distress, well developed, well nourished HEENT: Normocephalic, atraumatic, PERRL Cardiovascular: Normal rate and rhythm. Distal Pedis pulses faint Pulmonary: Normal pulmonary effort, normal breath sounds Gastrointestinal: Nondistended abdomen, soft, non-tender, normoactive bowel sounds Musculoskeletal:Normal ROM, no lower ext edema Skin: Skin is warm and dry. Neuro: No focal deficits noted, AAOx3. PSYCH: Attentive and cooperative  Data Reviewed:  Results for orders placed or performed during the hospital encounter of 12/22/23 (from the past 24 hours)  Basic metabolic panel     Status: Abnormal   Collection Time: 12/22/23  1:05 PM  Result Value Ref Range   Sodium 136 135 - 145 mmol/L   Potassium 4.4 3.5 - 5.1 mmol/L   Chloride 103 98 -  111 mmol/L   CO2 22 22 - 32 mmol/L   Glucose, Bld 360  (H) 70 - 99 mg/dL   BUN 16 8 - 23 mg/dL   Creatinine, Ser 8.67 (H) 0.61 - 1.24 mg/dL   Calcium  8.9 8.9 - 10.3 mg/dL   GFR, Estimated 55 (L) >60 mL/min   Anion gap 12 5 - 15  CBC     Status: Abnormal   Collection Time: 12/22/23  1:05 PM  Result Value Ref Range   WBC 5.3 4.0 - 10.5 K/uL   RBC 3.50 (L) 4.22 - 5.81 MIL/uL   Hemoglobin 7.8 (L) 13.0 - 17.0 g/dL   HCT 73.4 (L) 60.9 - 47.9 %   MCV 75.7 (L) 80.0 - 100.0 fL   MCH 22.3 (L) 26.0 - 34.0 pg   MCHC 29.4 (L) 30.0 - 36.0 g/dL   RDW 83.1 (H) 88.4 - 84.4 %   Platelets 269 150 - 400 K/uL   nRBC 0.0 0.0 - 0.2 %  Troponin T, High Sensitivity     Status: Abnormal   Collection Time: 12/22/23  1:05 PM  Result Value Ref Range   Troponin T High Sensitivity 24 (H) 0 - 19 ng/L  Pro Brain natriuretic peptide     Status: Abnormal   Collection Time: 12/22/23  1:05 PM  Result Value Ref Range   Pro Brain Natriuretic Peptide 1,531.0 (H) <300.0 pg/mL  Troponin T, High Sensitivity     Status: Abnormal   Collection Time: 12/22/23  3:30 PM  Result Value Ref Range   Troponin T High Sensitivity 22 (H) 0 - 19 ng/L  Occult blood card to lab, stool Provider will collect     Status: None   Collection Time: 12/22/23  3:31 PM  Result Value Ref Range   Fecal Occult Bld NEGATIVE NEGATIVE  Type and screen [facility]     Status: None (Preliminary result)   Collection Time: 12/22/23 10:36 PM  Result Value Ref Range   ABO/RH(D) PENDING    Antibody Screen PENDING    Sample Expiration      12/25/2023,2359 Performed at [facility], [address], [place], [place] [address]   Prepare RBC (crossmatch)     Status: None   Collection Time: 12/22/23 10:39 PM  Result Value Ref Range   Order Confirmation      ORDER PROCESSED BY BLOOD BANK Performed at [facility], [address], [place], [place] [address]      Assessment and Plan: Symptomatic anemia/ Acute CHF exacerbation and exertional chest pain likely due to anemia -  Transfuse -  Monitor hgb - His lower hemoglobin may be due to the procedures that he had relating to his infected bypass graft - We will continue the patient's blood thinners at this time - Troponins are not significantly elevated and trending down - Continue aspirin , Beta blocker  2. 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 Start date 10/11/2023. He took 6 weeks of IV Cefazolin  prior to oral antibiotics.  3. DMT2 -continue insulin  and add corrective dose insulin .  Will hold oral Jardiance  while hospitalized.  4. Htn -continue Coreg  and Norvasc  for now. May hold Isordil  until hgb improved.    Advance Care Planning:   Code Status: Prior the patient names his daughter, Butler as his surrogate decision maker and he wants to be full code.  Consults: none  Family Communication: none  Severity of Illness: The appropriate patient status for this patient is INPATIENT. Inpatient status is judged to be reasonable and necessary in order to provide the required intensity of service to ensure the patient's safety. The patient's presenting symptoms, physical exam findings, and initial radiographic and laboratory data in the context of their chronic comorbidities is felt to place them at high risk for further clinical deterioration. Furthermore, it is not anticipated that the patient will be medically stable for discharge from the hospital within 2 midnights of admission.   * I certify that at the point of admission it is my clinical judgment that the patient will require inpatient hospital care spanning beyond 2 midnights from the point of admission due to high intensity of service, high risk for further deterioration and high frequency of surveillance required.*  Author: ARTHEA CHILD, MD 12/22/2023 11:03 PM  For on call review [email or web address].

## 2023-12-22 NOTE — Telephone Encounter (Signed)
 Spoke with pts friend Reba per DPR- She stated that he has been having chest pain and very short of breath on exertion. She stated that he does not feel well today. Advised per Dr. Bernie to go to [facility] or [facility] ED for evaluation. She verbalized understanding and had no further questions.

## 2023-12-22 NOTE — ED Provider Notes (Signed)
 [place] [facility] Provider Note   CSN: [id number] Arrival date & time: 12/22/23  1257     Patient presents with: Chest Pain   [name] is a 78 y.o. male with an extensive past medical historyThat includes coronary artery disease, peripheral vascular disease, history of bladder cancer, distant history of smoking.  History is given by the patient and his girlfriend at bedside.  Patient has a history of aortobifem bypass graft.  He is also status post colostomy takedown.  His girlfriend states that after the colostomy takedown he developed an infection in his femorofemoral bypass graft which they think may have been secondary to the ostomy revision.  Patient is still taking cefadroxil  for the infection.  He has a longstanding history of clotting disorder including acute superficial femoral artery clot in the past.  He is on long-term aspirin  and Xarelto .  He presents emergency department today with complaint of fatigue and exertional chest pain and dyspnea.  He reports that this has been going on for 1 week.  Has never had this before to this extent.  He denies any chest pain or shortness of breath at rest but states that normal activities of daily living he could do a week ago are now extremely difficult and he becomes very winded.  Review of EMR shows his last heart cath was in 2019 that showed 100% occlusion of the LAD and RCA however patient had good collateral flow in the RCA and had a drug-eluting stent placed in the proximal LAD.  He denies any fevers or chills.  He denies any dark or tarry stool.    Chest Pain      Prior to Admission medications   Medication Sig Start Date End Date Taking? Authorizing Provider  acetaminophen  (TYLENOL ) 500 MG tablet Take 1,000 mg by mouth every 6 (six) hours as needed for moderate pain (pain score 4-6).   Yes [provider]  amLODipine  (NORVASC ) 5 MG tablet TAKE 1 TABLET(5 MG) BY MOUTH DAILY 11/13/23   Yes Krasowski, Robert J, MD  aspirin  EC 81 MG tablet Take 1 tablet (81 mg total) by mouth daily. Swallow whole. 05/10/21  Yes Verlin Lonni BIRCH, MD  carvedilol  (COREG ) 12.5 MG tablet Take 1 tablet (12.5 mg total) by mouth 2 (two) times daily with a meal. 01/24/23  Yes Bernie Lamar PARAS, MD  cefadroxil  (DURICEF) 500 MG capsule Take 2 capsules (1,000 mg total) by mouth 2 (two) times daily. 10/05/23  Yes Dennise Kingsley, MD  cetirizine  (ZYRTEC ) 10 MG tablet Take 1 tablet (10 mg total) by mouth daily. 02/09/23  Yes Fleeta Valeria Mayo, MD  cyclobenzaprine  (FLEXERIL ) 10 MG tablet Take 1 tablet (10 mg total) by mouth 3 (three) times daily as needed for muscle spasms. 05/24/23  Yes Fleeta Valeria Mayo, MD  ezetimibe  (ZETIA ) 10 MG tablet Take 1 tablet (10 mg total) by mouth daily. 03/01/23 02/24/24 Yes Krasowski, Robert J, MD  insulin  aspart (NOVOLOG ) 100 UNIT/ML FlexPen Inject 12 Units into the skin 3 (three) times daily with meals. Patient taking differently: Inject 12 Units into the skin 3 (three) times daily as needed for high blood sugar. 09/01/23  Yes Baglia, Corrina, PA-C  insulin  glargine (LANTUS ) 100 unit/mL SOPN 46 units subcut daily at nighttime Patient taking differently: Inject 50 Units into the skin at bedtime. 10/05/23  Yes Fleeta Valeria Mayo, MD  isosorbide  dinitrate (ISORDIL ) 10 MG tablet Take 1 tablet (10 mg total) by mouth 2 (two) times daily.  09/15/23  Yes Carlin Delon BROCKS, NP  JARDIANCE  10 MG TABS tablet Take 1 tablet (10 mg total) by mouth daily. 06/05/23  Yes Fleeta Valeria Mayo, MD  loperamide  (IMODIUM  A-D) 2 MG tablet Take 2 mg by mouth 4 (four) times daily as needed for diarrhea or loose stools.   Yes [provider]  nitroGLYCERIN  (NITROSTAT ) 0.4 MG SL tablet Place 0.4 mg under the tongue every 5 (five) minutes as needed for chest pain.   Yes [provider]  oxyCODONE -acetaminophen  (PERCOCET) 5-325 MG tablet Take 1 tablet by mouth every 6 (six) hours as needed for severe pain (pain  score 7-10). 09/01/23 08/31/24 Yes Baglia, Corrina, PA-C  rivaroxaban  (XARELTO ) 20 MG TABS tablet Take 1 tablet (20 mg total) by mouth daily with supper. 12/23/22  Yes Fleeta Valeria Mayo, MD  rosuvastatin  (CRESTOR ) 40 MG tablet TAKE 1 TABLET(40 MG) BY MOUTH DAILY 07/31/23  Yes Fleeta Valeria Mayo, MD  tamsulosin  (FLOMAX ) 0.4 MG CAPS capsule Take 0.8 mg by mouth every evening. Patient not taking: Reported on 12/23/2023 03/31/23   [provider]    Allergies: Lisinopril  and Levaquin [levofloxacin]    Review of Systems  Cardiovascular:  Positive for chest pain.    Updated Vital Signs BP (!) 145/69 (BP Location: Right Arm)   Pulse 72   Temp 97.8 F (36.6 C) (Oral)   Resp 20   Ht 5' 7 (1.702 m)   Wt 81.1 kg   SpO2 96%   BMI 28.00 kg/m   Physical Exam Vitals and nursing note reviewed.  Constitutional:      General: He is not in acute distress.    Appearance: He is well-developed. He is not diaphoretic.  HENT:     Head: Normocephalic and atraumatic.  Eyes:     General: No scleral icterus.    Conjunctiva/sclera: Conjunctivae normal.  Cardiovascular:     Rate and Rhythm: Normal rate and regular rhythm.     Heart sounds: Normal heart sounds.  Pulmonary:     Effort: Pulmonary effort is normal. No respiratory distress.     Breath sounds: Normal breath sounds.  Abdominal:     Palpations: Abdomen is soft.     Tenderness: There is no abdominal tenderness.  Musculoskeletal:     Cervical back: Normal range of motion and neck supple.  Skin:    General: Skin is warm and dry.  Neurological:     Mental Status: He is alert.  Psychiatric:        Behavior: Behavior normal.     (all labs ordered are listed, but only abnormal results are displayed) Labs Reviewed  BASIC METABOLIC PANEL WITH GFR - Abnormal; Notable for the following components:      Result Value   Glucose, Bld 360 (*)    Creatinine, Ser 1.32 (*)    GFR, Estimated 55 (*)    All other components within normal limits  CBC  - Abnormal; Notable for the following components:   RBC 3.50 (*)    Hemoglobin 7.8 (*)    HCT 26.5 (*)    MCV 75.7 (*)    MCH 22.3 (*)    MCHC 29.4 (*)    RDW 16.8 (*)    All other components within normal limits  PRO BRAIN NATRIURETIC PEPTIDE - Abnormal; Notable for the following components:   Pro Brain Natriuretic Peptide 1,531.0 (*)    All other components within normal limits  HEMOGLOBIN AND HEMATOCRIT, BLOOD - Abnormal; Notable for the following components:  Hemoglobin 7.4 (*)    HCT 26.1 (*)    All other components within normal limits  BASIC METABOLIC PANEL WITH GFR - Abnormal; Notable for the following components:   CO2 21 (*)    Glucose, Bld 148 (*)    Creatinine, Ser 1.30 (*)    Calcium  8.8 (*)    GFR, Estimated 56 (*)    All other components within normal limits  CBC - Abnormal; Notable for the following components:   RBC 3.68 (*)    Hemoglobin 8.6 (*)    HCT 28.8 (*)    MCV 78.3 (*)    MCH 23.4 (*)    MCHC 29.9 (*)    RDW 17.5 (*)    All other components within normal limits  GLUCOSE, CAPILLARY - Abnormal; Notable for the following components:   Glucose-Capillary 128 (*)    All other components within normal limits  GLUCOSE, CAPILLARY - Abnormal; Notable for the following components:   Glucose-Capillary 205 (*)    All other components within normal limits  TROPONIN T, HIGH SENSITIVITY - Abnormal; Notable for the following components:   Troponin T High Sensitivity 24 (*)    All other components within normal limits  TROPONIN T, HIGH SENSITIVITY - Abnormal; Notable for the following components:   Troponin T High Sensitivity 22 (*)    All other components within normal limits  OCCULT BLOOD X 1 CARD TO LAB, STOOL  OCCULT BLOOD X 1 CARD TO LAB, STOOL  TYPE AND SCREEN  PREPARE RBC (CROSSMATCH)    EKG: EKG Interpretation Date/Time:  Friday December 22 2023 13:06:54 EDT Ventricular Rate:  83 PR Interval:    QRS Duration:  91 QT Interval:  382 QTC  Calculation: 449 R Axis:   78  Text Interpretation: sinus rythm Ventricular premature complex Low voltage, precordial leads Nonspecific T abnormalities, inferior leads Baseline wander in lead(s) V1 when comapared to prior, more PVC No STEMI Confirmed by Ginger Barefoot ([id number]) on 12/22/2023 1:28:37 PM  Radiology: DG Chest 2 View Result Date: 12/22/2023 CLINICAL DATA:  Chest pain x1 week. EXAM: CHEST - 2 VIEW COMPARISON:  August 11, 2023 FINDINGS: The heart size and mediastinal contours are within normal limits. Marked severity calcification of the aortic arch is seen. Both lungs are clear. The visualized skeletal structures are unremarkable. IMPRESSION: No active cardiopulmonary disease. Electronically Signed   By: Suzen Dials M.D.   On: 12/22/2023 13:55     Procedures   Medications Ordered in the ED  acetaminophen  (TYLENOL ) tablet 650 mg (has no administration in time range)    Or  acetaminophen  (TYLENOL ) suppository 650 mg (has no administration in time range)  ondansetron  (ZOFRAN ) tablet 4 mg (has no administration in time range)    Or  ondansetron  (ZOFRAN ) injection 4 mg (has no administration in time range)  bisacodyl  (DULCOLAX) EC tablet 5 mg (has no administration in time range)  melatonin tablet 3 mg (has no administration in time range)  cefadroxil  (DURICEF) capsule 1,000 mg (1,000 mg Oral Given 12/23/23 0925)  rivaroxaban  (XARELTO ) tablet 20 mg (has no administration in time range)  insulin  aspart (novoLOG ) injection 0-6 Units (2 Units Subcutaneous Given 12/23/23 1134)  carvedilol  (COREG ) tablet 12.5 mg (12.5 mg Oral Given 12/23/23 0925)  rosuvastatin  (CRESTOR ) tablet 40 mg (40 mg Oral Given 12/23/23 0925)  tamsulosin  (FLOMAX ) capsule 0.8 mg (has no administration in time range)  insulin  glargine (LANTUS ) injection 30 Units (has no administration in time range)  amLODipine  (NORVASC ) tablet 5  mg (5 mg Oral Given 12/23/23 0924)  0.9 %  sodium chloride  infusion (Manually program via  Guardrails IV Fluids) ( Intravenous New Bag/Given 12/22/23 2300)  acetaminophen  (TYLENOL ) tablet 650 mg (650 mg Oral Given 12/23/23 0506)  furosemide  (LASIX ) injection 40 mg (40 mg Intravenous Given 12/23/23 1134)    Clinical Course as of 12/23/23 1213  Fri Dec 22, 2023  1548 Troponin T, High Sensitivity(!) Review of troponin I's from February 2024 shows elevated troponins at 23 and 27  [AH]  1549 Glucose(!): 360 Blood sugars significantly higher than normal [AH]  1549 Creatinine(!): 1.32 Creatinine is at baseline [AH]  1549 Hemoglobin(!): 7.8 Hemoglobin has dropped significantly from 1 month ago from 11.4 [AH]  1549 Pro Brain natriuretic peptide(!) [AH]  1550 Pro Brain Natriuretic Peptide(!): 1,531.0 NT-Pro BNP 3,744 High  (previous from last year)  Now 1531 [AH]  1601 Troponin T High Sensitivity(!): 22 [AH]  1634 Case discussed with Dr. Jackalyn- will admit the patient for symptomatic anemia and high risk cp. [AH]    Clinical Course User Index [AH] Arloa Chroman, PA-C                                 Medical Decision Making Amount and/or Complexity of Data Reviewed Labs: ordered. Decision-making details documented in ED Course. Radiology: ordered.  Risk Decision regarding hospitalization.   12:13 PM This patient presents to the ED for concern of sob,fatigue, chest pain his involves an extensive number of treatment options, and is a complaint that carries with it a high risk of complications and morbidity.  The emergent differential diagnosis for shortness of breath includes, but is not limited to, Pulmonary edema, bronchoconstriction, Pneumonia, Pulmonary embolism, Pneumotherax/ Hemothorax, acute anemia, Dysrythmia, ACS.    Co morbidities:   has a past medical history of Acute low back pain (01/12/2015), Acute lower limb ischemia (07/11/2023), Acute pain of both shoulders (07/22/2022), AKI (acute kidney injury) (HCC) (05/24/2022), Arthritis, Atherosclerosis of native arteries of  the extremities with intermittent claudication (08/13/2013), Atherosclerosis of native artery of extremity with intermittent claudication (HCC) (08/13/2013), Backache (02/08/2013), Benign prostatic hyperplasia with urinary frequency (02/23/2023), Bladder cancer (HCC), BMI 27.0-27.9,adult (02/23/2023), Cellulitis (07/17/2023), Chronic combined systolic (congestive) and diastolic (congestive) heart failure (HCC) (07/22/2022), Chronic left shoulder pain (05/24/2023), Chronic right shoulder pain (09/02/2022), Chronic systolic CHF (congestive heart failure) (HCC), CKD stage 3b, GFR 30-44 ml/min (HCC) (07/03/2023), Coronary artery disease, Coronary artery disease involving native coronary artery of native heart with unstable angina pectoris (HCC), Current moderate episode of major depressive disorder without prior episode (HCC) (03/28/2022), Diverticulitis (06/10/2022), Diverticulitis large intestine (05/11/2021), Diverticulitis of large intestine with abscess (05/24/2022), Diverticulitis of sigmoid colon (05/11/2021), DM2 (diabetes mellitus, type 2) (HCC) (02/09/2022), Dyssynergic defecation (03/21/2023), Essential hypertension (07/22/2022), Fatigue (03/13/2023), History of bladder cancer (02/23/2023), History of colonic diverticulitis (03/21/2023), Hypercholesterolemia (10/28/2022), HYPERLIPIDEMIA-MIXED (09/22/2008), Hypothyroidism (05/24/2022), Ischemic cardiomyopathy, Lethargy (03/28/2022), Lumbar spinal stenosis, Myocardial infarction [facility]), Need for prophylactic vaccination and inoculation against influenza (03/28/2022), NSVT (nonsustained ventricular tachycardia) (HCC), PAD (peripheral artery disease) (HCC) (07/17/2023), PAF (paroxysmal atrial fibrillation) (HCC) (02/09/2022), Paroxysmal atrial fibrillation (HCC), Peripheral arterial disease (HCC), Peripheral artery disease (HCC) (07/09/2023), Poorly controlled T2 diabetes mellitus (HCC) (09/27/2022), Protein-calorie malnutrition, severe (05/29/2022), PVC's  (premature ventricular contractions), PVD (09/22/2008), PVD (peripheral vascular disease) (HCC) (02/23/2023), Rotator cuff arthropathy of right shoulder (09/27/2022), Sigmoid diverticulitis (06/30/2023), Status post coronary artery stent placement, Status post Hartmann's procedure (HCC) (03/21/2023), TOBACCO ABUSE (05/29/2009), Trigger ring  finger of right hand (03/29/2023), Type 2 diabetes mellitus with other circulatory complications (HCC) (07/22/2022), Type II diabetes mellitus (HCC), and Unstable angina (HCC) (10/16/2017).   Social Determinants of Health:   Social determinants of health  Additional history:  {Additional history obtained from patient at bedside {External records from outside source obtained and reviewed including previous cardiology notes, vascular notes  Lab Tests:  I Ordered, and personally interpreted labs.  The pertinent results include:   As per ED course  Imaging Studies:  I ordered imaging studies including two-view chest x-ray I independently visualized and interpreted imaging which showed no acute fine I agree with the radiologist interpretation  Cardiac Monitoring/ECG:  The patient was maintained on a cardiac monitor.  I personally viewed and interpreted the cardiac monitored which showed an underlying rhythm of:  Sinus rhythm at a rate of 83  Test Considered:    Critical Interventions:    Consultations Obtained: Dr. Raenelle for admission  Problem List / ED Course:     ICD-10-CM   1. Symptomatic anemia  D64.9     2. Chest pain with high risk for cardiac etiology  R07.9       MDM:  Patient with symptomatic anemia vs angina.  Needs admission, no signs of gi bleed  Dispostion:  After consideration of the diagnostic results and the patients response to treatment, I feel that the patent would benefit from admission.      Final diagnoses:  Symptomatic anemia  Chest pain with high risk for cardiac etiology    ED Discharge  Orders     None          Arloa Chroman, PA-C 12/23/23 1217    Darra Fonda MATSU, MD 12/25/23 [phone]

## 2023-12-23 ENCOUNTER — Inpatient Hospital Stay (HOSPITAL_BASED_OUTPATIENT_CLINIC_OR_DEPARTMENT_OTHER)

## 2023-12-23 DIAGNOSIS — R0609 Other forms of dyspnea: Secondary | ICD-10-CM | POA: Diagnosis not present

## 2023-12-23 DIAGNOSIS — D649 Anemia, unspecified: Secondary | ICD-10-CM | POA: Diagnosis not present

## 2023-12-23 LAB — ECHOCARDIOGRAM COMPLETE
Area-P 1/2: 3.77 cm2
Calc EF: 32.8 %
Height: 67 in
S' Lateral: 4.3 cm
Single Plane A2C EF: 35.2 %
Single Plane A4C EF: 31 %
Weight: 2860.69 [oz_av]

## 2023-12-23 LAB — BASIC METABOLIC PANEL WITH GFR
Anion gap: 10 (ref 5–15)
BUN: 14 mg/dL (ref 8–23)
CO2: 21 mmol/L — ABNORMAL LOW (ref 22–32)
Calcium: 8.8 mg/dL — ABNORMAL LOW (ref 8.9–10.3)
Chloride: 108 mmol/L (ref 98–111)
Creatinine, Ser: 1.3 mg/dL — ABNORMAL HIGH (ref 0.61–1.24)
GFR, Estimated: 56 mL/min — ABNORMAL LOW (ref >60)
Glucose, Bld: 148 mg/dL — ABNORMAL HIGH (ref 70–99)
Potassium: 4.3 mmol/L (ref 3.5–5.1)
Sodium: 139 mmol/L (ref 135–145)

## 2023-12-23 LAB — CBC
HCT: 28.8 % — ABNORMAL LOW (ref 39.0–52.0)
Hemoglobin: 8.6 g/dL — ABNORMAL LOW (ref 13.0–17.0)
MCH: 23.4 pg — ABNORMAL LOW (ref 26.0–34.0)
MCHC: 29.9 g/dL — ABNORMAL LOW (ref 30.0–36.0)
MCV: 78.3 fL — ABNORMAL LOW (ref 80.0–100.0)
Platelets: 241 K/uL (ref 150–400)
RBC: 3.68 MIL/uL — ABNORMAL LOW (ref 4.22–5.81)
RDW: 17.5 % — ABNORMAL HIGH (ref 11.5–15.5)
WBC: 5.8 K/uL (ref 4.0–10.5)
nRBC: 0 % (ref 0.0–0.2)

## 2023-12-23 LAB — GLUCOSE, CAPILLARY
Glucose-Capillary: 128 mg/dL — ABNORMAL HIGH (ref 70–99)
Glucose-Capillary: 205 mg/dL — ABNORMAL HIGH (ref 70–99)
Glucose-Capillary: 273 mg/dL — ABNORMAL HIGH (ref 70–99)
Glucose-Capillary: 255 mg/dL — ABNORMAL HIGH (ref 70–99)

## 2023-12-23 MED ORDER — INSULIN ASPART 100 UNIT/ML IJ SOLN
0.0000 [IU] | Freq: Three times a day (TID) | INTRAMUSCULAR | Status: DC
  Administered 2023-12-23: 2 [IU] via SUBCUTANEOUS
  Administered 2023-12-23: 3 [IU] via SUBCUTANEOUS
  Administered 2023-12-24: 1 [IU] via SUBCUTANEOUS

## 2023-12-23 MED ORDER — TAMSULOSIN HCL 0.4 MG PO CAPS
0.8000 mg | ORAL_CAPSULE | Freq: Every evening | ORAL | Status: DC
  Administered 2023-12-23: 0.8 mg via ORAL
  Filled 2023-12-23: qty 2

## 2023-12-23 MED ORDER — ACETAMINOPHEN 325 MG PO TABS
650.0000 mg | ORAL_TABLET | Freq: Once | ORAL | Status: AC
  Administered 2023-12-23: 650 mg via ORAL
  Filled 2023-12-23: qty 2

## 2023-12-23 MED ORDER — CARVEDILOL 12.5 MG PO TABS
12.5000 mg | ORAL_TABLET | Freq: Two times a day (BID) | ORAL | Status: DC
  Administered 2023-12-23 – 2023-12-24 (×3): 12.5 mg via ORAL
  Filled 2023-12-23 (×3): qty 1

## 2023-12-23 MED ORDER — AMLODIPINE BESYLATE 5 MG PO TABS
5.0000 mg | ORAL_TABLET | Freq: Every day | ORAL | Status: DC
Start: 2023-12-23 — End: 2023-12-24
  Administered 2023-12-23 – 2023-12-24 (×2): 5 mg via ORAL
  Filled 2023-12-23 (×2): qty 1

## 2023-12-23 MED ORDER — FUROSEMIDE 10 MG/ML IJ SOLN
40.0000 mg | Freq: Once | INTRAMUSCULAR | Status: AC
  Administered 2023-12-23: 40 mg via INTRAVENOUS
  Filled 2023-12-23: qty 4

## 2023-12-23 MED ORDER — ROSUVASTATIN CALCIUM 20 MG PO TABS
40.0000 mg | ORAL_TABLET | Freq: Every day | ORAL | Status: DC
  Administered 2023-12-23 – 2023-12-24 (×2): 40 mg via ORAL
  Filled 2023-12-23 (×2): qty 2

## 2023-12-23 MED ORDER — INSULIN GLARGINE 100 UNIT/ML ~~LOC~~ SOLN
30.0000 [IU] | Freq: Every day | SUBCUTANEOUS | Status: DC
  Administered 2023-12-23: 30 [IU] via SUBCUTANEOUS
  Filled 2023-12-23 (×2): qty 0.3

## 2023-12-23 NOTE — Progress Notes (Signed)
  Echocardiogram 2D Echocardiogram has been performed.  Cornesha Radziewicz 12/23/2023, 3:55 PM

## 2023-12-23 NOTE — Plan of Care (Signed)
   Problem: Clinical Measurements: Goal: Cardiovascular complication will be avoided Outcome: Progressing

## 2023-12-23 NOTE — Care Management CC44 (Signed)
 Condition Code 44 Documentation Completed  Patient Details  Name: [name] MRN: [id number] Date of Birth: Apr 29, 1945   Condition Code 44 given:  Yes Patient signature on Condition Code 44 notice:  Yes Documentation of 2 MD's agreement:  Yes Code 44 added to claim:  Yes    Marval Gell, RN 12/23/2023, 3:36 PM

## 2023-12-23 NOTE — Progress Notes (Signed)
 PROGRESS NOTE  [name] FMW:[id number] DOB: 04/07/1946 DOA: 12/22/2023 PCP: Fleeta Valeria Mayo, MD   LOS: 1 day   Brief narrative:  [name] is a 78 y.o. male with past medical history significant for ischemic limb treated with mechanical thrombectomy and right SFA drug coated balloon angioplasty on 07/10/2023, history of paroxysmal atrial fibrillation on Coumadin,. hyperlipidemia, hypertension, and ischemic cardiomyopathy with EF of 25%.  Presented to hospital with increasing shortness of breath with chest discomfort even on light exertion.  He had exertional dyspnea and leg cramping and called his cardiologist who recommended him to come to the hospital.  Of note patient had graft infection on 08/25/2023 and had washout with antibiotic beads placed in.  He initially received IV Ancef  and has been on oral cefadroxil  at this time.  In the ED workup showed hemoglobin of 7.8 with previous hemoglobin of 9.74 months back.  proBNP elevated at 1531.  Patient was chest pain free at rest and troponin was 24.  Denied any obvious bleeding.  No history of transfusion in the past.  Patient was Hemoccult negative in the ED. patient was then admitted hospital for further evaluation and treatment.  Assessment/Plan: Principal Problem:   Symptomatic anemia Active Problems:   Chronic systolic CHF (congestive heart failure) (HCC)   DM2 (diabetes mellitus, type 2) (HCC)   Hypercholesterolemia   PVD   Status post coronary artery stent placement   Paroxysmal atrial fibrillation (HCC)   Benign prostatic hyperplasia with urinary frequency   PAD (peripheral artery disease) (HCC)   Infection of vascular bypass graft (HCC)   Chest pain  Symptomatic anemia/ Acute CHF exacerbation and exertional chest pain likely due to anemia Received 1 unit of packed PRBC transfusion.  No obvious bleeding.  Anemia could be secondary to recent surgical procedure.  Continue aspirin  beta-blocker.  Initial hemoglobin was 7.8.  Has  improved to 8.6.  Review of previous 2D echocardiogram from 08/30/2023 shows LV ejection fraction of 35 to 40% with regional wall motion abnormalities.  Will put the patient on strict intake and output charting Daily weights.  Patient still complains of shortness of breath especially on exertion.  Will try 1 dose of IV Lasix  40 mg and see how he does..  Repeat 2D echocardiogram.  Might benefit from diuretics on discharge  MSSA infection of the left to right femorofemoral bypass graft - Continue Cefadroxil  bid which he will take for 4.5 months. Start date 10/11/2023. He took 6 weeks of IV Cefazolin  prior to oral antibiotics.   Diabetes mellitus type 2- Jardiance  on hold.  Continue sliding scale insulin  Accu-Cheks diabetic diet.  Closely monitor.   Essential hypertension- On Coreg  and Norvasc .  Isordil  on hold    DVT prophylaxis:  rivaroxaban  (XARELTO ) tablet 20 mg   Disposition: Home likely in 1 to 2 days.  Patient lives alone by himself at home.  Will get PT evaluation.  Status is: Inpatient Remains inpatient appropriate because: Pending clinical improvement, status posttransfusion, IV diuretics PT evaluation    Code Status:     Code Status: Full Code  Family Communication: None  Consultants: None  Procedures: None  Anti-infectives:  Cefadroxil   Anti-infectives (From admission, onward)    Start     Dose/Rate Route Frequency Ordered Stop   12/23/23 1000  cefadroxil  (DURICEF) capsule 1,000 mg        1,000 mg Oral 2 times daily 12/22/23 2343          Subjective: Today, patient was seen  and examined at bedside.   Objective: Vitals:   12/23/23 0650 12/23/23 0748  BP: 126/66 (!) 121/56  Pulse: 73 84  Resp: 15 18  Temp: (!) 97.4 F (36.3 C) 97.6 F (36.4 C)  SpO2: 95% 95%    Intake/Output Summary (Last 24 hours) at 12/23/2023 1025 Last data filed at 12/23/2023 0800 Gross per 24 hour  Intake 912.08 ml  Output 600 ml  Net 312.08 ml   Filed Weights   12/22/23 1303   Weight: 81.1 kg   Body mass index is 28 kg/m.   Physical Exam: GENERAL: Patient is alert awake and oriented. Not in obvious distress.  Elderly male, Communicative, HENT: Mild pallor noted pupils equally reactive to light. Oral mucosa is moist NECK: is supple, no gross swelling noted. CHEST: Decreased breath sounds bilaterally. CVS: S1 and S2 heard, no murmur. Regular rate and rhythm.  ABDOMEN: Soft, non-tender, bowel sounds are present. EXTREMITIES: No edema. CNS: Cranial nerves are intact. No focal motor deficits. SKIN: warm and dry without rashes.  Data Review: I have personally reviewed the following laboratory data and studies,  CBC: Recent Labs  Lab 12/22/23 1305 12/22/23 2236 12/23/23 0748  WBC 5.3  --  5.8  HGB 7.8* 7.4* 8.6*  HCT 26.5* 26.1* 28.8*  MCV 75.7*  --  78.3*  PLT 269  --  241   Basic Metabolic Panel: Recent Labs  Lab 12/22/23 1305 12/23/23 0748  NA 136 139  K 4.4 4.3  CL 103 108  CO2 22 21*  GLUCOSE 360* 148*  BUN 16 14  CREATININE 1.32* 1.30*  CALCIUM  8.9 8.8*   Liver Function Tests: No results for input(s): AST, ALT, ALKPHOS, BILITOT, PROT, ALBUMIN  in the last 168 hours. No results for input(s): LIPASE, AMYLASE in the last 168 hours. No results for input(s): AMMONIA in the last 168 hours. Cardiac Enzymes: No results for input(s): CKTOTAL, CKMB, CKMBINDEX, TROPONINI in the last 168 hours. BNP (last 3 results) No results for input(s): BNP in the last 8760 hours.  ProBNP (last 3 results) Recent Labs    12/22/23 1305  PROBNP 1,531.0*    CBG: Recent Labs  Lab 12/23/23 0629  GLUCAP 128*   No results found for this or any previous visit (from the past 240 hours).   Studies: DG Chest 2 View Result Date: 12/22/2023 CLINICAL DATA:  Chest pain x1 week. EXAM: CHEST - 2 VIEW COMPARISON:  August 11, 2023 FINDINGS: The heart size and mediastinal contours are within normal limits. Marked severity calcification of  the aortic arch is seen. Both lungs are clear. The visualized skeletal structures are unremarkable. IMPRESSION: No active cardiopulmonary disease. Electronically Signed   By: Suzen Dials M.D.   On: 12/22/2023 13:55      Vernal Alstrom, MD  [facility] 12/23/2023  If 7PM-7AM, please contact night-coverage

## 2023-12-23 NOTE — Care Management Obs Status (Signed)
 MEDICARE OBSERVATION STATUS NOTIFICATION   Patient Details  Name: [name] MRN: [id number] Date of Birth: 1945-05-21   Medicare Observation Status Notification Given:  Yes    Marval Gell, RN 12/23/2023, 3:36 PM

## 2023-12-24 DIAGNOSIS — D649 Anemia, unspecified: Secondary | ICD-10-CM | POA: Diagnosis not present

## 2023-12-24 LAB — BPAM RBC
Blood Product Expiration Date: 202509202359
ISSUE DATE / TIME: 202509060310
Unit Type and Rh: 9500

## 2023-12-24 LAB — BASIC METABOLIC PANEL WITH GFR
Anion gap: 11 (ref 5–15)
BUN: 22 mg/dL (ref 8–23)
CO2: 21 mmol/L — ABNORMAL LOW (ref 22–32)
Calcium: 8.9 mg/dL (ref 8.9–10.3)
Chloride: 104 mmol/L (ref 98–111)
Creatinine, Ser: 1.7 mg/dL — ABNORMAL HIGH (ref 0.61–1.24)
GFR, Estimated: 41 mL/min — ABNORMAL LOW (ref >60)
Glucose, Bld: 202 mg/dL — ABNORMAL HIGH (ref 70–99)
Potassium: 3.8 mmol/L (ref 3.5–5.1)
Sodium: 136 mmol/L (ref 135–145)

## 2023-12-24 LAB — CBC
HCT: 30.3 % — ABNORMAL LOW (ref 39.0–52.0)
Hemoglobin: 9.1 g/dL — ABNORMAL LOW (ref 13.0–17.0)
MCH: 22.8 pg — ABNORMAL LOW (ref 26.0–34.0)
MCHC: 30 g/dL (ref 30.0–36.0)
MCV: 75.9 fL — ABNORMAL LOW (ref 80.0–100.0)
Platelets: 264 K/uL (ref 150–400)
RBC: 3.99 MIL/uL — ABNORMAL LOW (ref 4.22–5.81)
RDW: 17.4 % — ABNORMAL HIGH (ref 11.5–15.5)
WBC: 6.6 K/uL (ref 4.0–10.5)
nRBC: 0 % (ref 0.0–0.2)

## 2023-12-24 LAB — TYPE AND SCREEN
ABO/RH(D): O NEG
Antibody Screen: NEGATIVE
Unit division: 0

## 2023-12-24 LAB — GLUCOSE, CAPILLARY: Glucose-Capillary: 181 mg/dL — ABNORMAL HIGH (ref 70–99)

## 2023-12-24 LAB — MAGNESIUM: Magnesium: 2 mg/dL (ref 1.7–2.4)

## 2023-12-24 MED ORDER — FUROSEMIDE 20 MG PO TABS: 20.0000 mg | ORAL_TABLET | Freq: Every morning | ORAL | 0 refills | Status: DC

## 2023-12-24 NOTE — Plan of Care (Signed)
   Problem: Education: Goal: Knowledge of General Education information will improve Description Including pain rating scale, medication(s)/side effects and non-pharmacologic comfort measures Outcome: Progressing

## 2023-12-24 NOTE — Evaluation (Addendum)
 Physical Therapy Evaluation Patient Details Name: [name] MRN: [id number] DOB: March 01, 1946 Today's Date: 12/24/2023  History of Present Illness  Pt is a 78 y.o. male admitted 9/5 with symptomatic anemia. PMH: PAD s/p fem-fem bypass, T2DM, HTN, CAD s/p PTCA/DES to prox LAD, chronic HFrEF, a fib, HLD, GERD, BPH, CKD  Clinical Impression  PT eval complete. Pt independent bed mobility and transfers. Supervision provided for hallway amb 225' without AD. Steady gait noted. Pt with c/o chronic bilat hip pain with amb increased distances. SpO2 97% on RA. HR up to 95 with mobility. No further skilled PT intervention indicated. PT signing off. Will defer to mobility team for further amb if pt remains inpatient.         If plan is discharge home, recommend the following:     Can travel by private vehicle        Equipment Recommendations None recommended by PT  Recommendations for Other Services       Functional Status Assessment Patient has not had a recent decline in their functional status     Precautions / Restrictions Precautions Precautions: None      Mobility  Bed Mobility Overal bed mobility: Independent                  Transfers Overall transfer level: Independent Equipment used: None                    Ambulation/Gait Ambulation/Gait assistance: Supervision Gait Distance (Feet): 225 Feet Assistive device: None Gait Pattern/deviations: Step-through pattern, Wide base of support   Gait velocity interpretation: 1.31 - 2.62 ft/sec, indicative of limited community ambulator   General Gait Details: steady gait without AD  Stairs            Wheelchair Mobility     Tilt Bed    Modified Rankin (Stroke Patients Only)       Balance Overall balance assessment: No apparent balance deficits (not formally assessed)                                           Pertinent Vitals/Pain Pain Assessment Pain Assessment:  Faces Faces Pain Scale: Hurts little more Pain Location: bilat hips after amb Pain Descriptors / Indicators: Discomfort, Grimacing Pain Intervention(s): Monitored during session, Limited activity within patient's tolerance    Home Living Family/patient expects to be discharged to:: Private residence Living Arrangements: Spouse/significant other Available Help at Discharge: Available PRN/intermittently;Family Type of Home: House Home Access: Level entry       Home Layout: One level Home Equipment: Agricultural consultant (2 wheels)      Prior Function Prior Level of Function : Independent/Modified Independent;Driving             Mobility Comments: no AD       Extremity/Trunk Assessment   Upper Extremity Assessment Upper Extremity Assessment: Overall WFL for tasks assessed    Lower Extremity Assessment Lower Extremity Assessment: Overall WFL for tasks assessed    Cervical / Trunk Assessment Cervical / Trunk Assessment: Normal  Communication   Communication Communication: Impaired Factors Affecting Communication: Hearing impaired    Cognition Arousal: Alert Behavior During Therapy: WFL for tasks assessed/performed   PT - Cognitive impairments: No apparent impairments                         Following  commands: Intact       Cueing       General Comments General comments (skin integrity, edema, etc.): Resting HR in 70s. Up to 95 after amb. SpO2 97% on RA.    Exercises     Assessment/Plan    PT Assessment Patient does not need any further PT services  PT Problem List         PT Treatment Interventions      PT Goals (Current goals can be found in the Care Plan section)  Acute Rehab PT Goals Patient Stated Goal: home PT Goal Formulation: All assessment and education complete, DC therapy    Frequency       Co-evaluation               AM-PAC PT 6 Clicks Mobility  Outcome Measure Help needed turning from your back to your side  while in a flat bed without using bedrails?: None Help needed moving from lying on your back to sitting on the side of a flat bed without using bedrails?: None Help needed moving to and from a bed to a chair (including a wheelchair)?: None Help needed standing up from a chair using your arms (e.g., wheelchair or bedside chair)?: None Help needed to walk in hospital room?: None Help needed climbing 3-5 steps with a railing? : A Little 6 Click Score: 23    End of Session   Activity Tolerance: Patient tolerated treatment well Patient left: in bed;with call bell/phone within reach Nurse Communication: Mobility status PT Visit Diagnosis: Difficulty in walking, not elsewhere classified (R26.2)    Time: 9190-9171 PT Time Calculation (min) (ACUTE ONLY): 19 min   Charges:   PT Evaluation $PT Eval Low Complexity: 1 Low   PT General Charges $$ ACUTE PT VISIT: 1 Visit         Sari MATSU., PT  Office # [phone]   Erven Sari Shaker 12/24/2023, 8:37 AM

## 2023-12-24 NOTE — Plan of Care (Signed)
  Problem: Education: Goal: Knowledge of General Education information will improve Description: Including pain rating scale, medication(s)/side effects and non-pharmacologic comfort measures 12/24/2023 0952 by Mister Krahenbuhl N, RN Outcome: Adequate for Discharge 12/24/2023 0829 by Gail Cathryne SAILOR, RN Outcome: Progressing   Problem: Health Behavior/Discharge Planning: Goal: Ability to manage health-related needs will improve Outcome: Adequate for Discharge   Problem: Clinical Measurements: Goal: Ability to maintain clinical measurements within normal limits will improve Outcome: Adequate for Discharge Goal: Will remain free from infection Outcome: Adequate for Discharge Goal: Diagnostic test results will improve Outcome: Adequate for Discharge Goal: Respiratory complications will improve Outcome: Adequate for Discharge Goal: Cardiovascular complication will be avoided Outcome: Adequate for Discharge   Problem: Activity: Goal: Risk for activity intolerance will decrease Outcome: Adequate for Discharge   Problem: Nutrition: Goal: Adequate nutrition will be maintained Outcome: Adequate for Discharge   Problem: Coping: Goal: Level of anxiety will decrease Outcome: Adequate for Discharge   Problem: Elimination: Goal: Will not experience complications related to bowel motility Outcome: Adequate for Discharge Goal: Will not experience complications related to urinary retention Outcome: Adequate for Discharge   Problem: Pain Managment: Goal: General experience of comfort will improve and/or be controlled Outcome: Adequate for Discharge   Problem: Safety: Goal: Ability to remain free from injury will improve Outcome: Adequate for Discharge   Problem: Skin Integrity: Goal: Risk for impaired skin integrity will decrease Outcome: Adequate for Discharge   Problem: Education: Goal: Ability to describe self-care measures that may prevent or decrease complications (Diabetes  Survival Skills Education) will improve Outcome: Adequate for Discharge Goal: Individualized Educational Video(s) Outcome: Adequate for Discharge   Problem: Coping: Goal: Ability to adjust to condition or change in health will improve Outcome: Adequate for Discharge   Problem: Fluid Volume: Goal: Ability to maintain a balanced intake and output will improve Outcome: Adequate for Discharge   Problem: Health Behavior/Discharge Planning: Goal: Ability to identify and utilize available resources and services will improve Outcome: Adequate for Discharge Goal: Ability to manage health-related needs will improve Outcome: Adequate for Discharge   Problem: Metabolic: Goal: Ability to maintain appropriate glucose levels will improve Outcome: Adequate for Discharge   Problem: Nutritional: Goal: Maintenance of adequate nutrition will improve Outcome: Adequate for Discharge Goal: Progress toward achieving an optimal weight will improve Outcome: Adequate for Discharge   Problem: Skin Integrity: Goal: Risk for impaired skin integrity will decrease Outcome: Adequate for Discharge   Problem: Tissue Perfusion: Goal: Adequacy of tissue perfusion will improve Outcome: Adequate for Discharge

## 2023-12-24 NOTE — Discharge Summary (Signed)
 Physician Discharge Summary  [name] DOB: 1945-12-02 DOA: 12/22/2023  PCP: Fleeta Valeria Mayo, MD  Admit date: 12/22/2023 Discharge date: 12/24/2023  Admitted From: Home  Discharge disposition: Home   Recommendations for Outpatient Follow-Up:   Follow up with your primary care provider in one week.  Check CBC, BMP, magnesium in the next visit Patient was started on low-dose Lasix  on discharge.  Please adjust diuretic doses depending upon renal function.   Discharge Diagnosis:   Principal Problem:   Symptomatic anemia Active Problems:   Chronic systolic CHF (congestive heart failure) (HCC)   DM2 (diabetes mellitus, type 2) (HCC)   Hypercholesterolemia   PVD   Status post coronary artery stent placement   Paroxysmal atrial fibrillation (HCC)   Benign prostatic hyperplasia with urinary frequency   PAD (peripheral artery disease) (HCC)   Infection of vascular bypass graft (HCC)   Chest pain   Discharge Condition: Improved.  Diet recommendation: Low sodium, heart healthy.  Carbohydrate-modified.    Wound care: None.  Code status: Full.   History of Present Illness:   [name] is a 78 y.o. male with past medical history significant for ischemic limb treated with mechanical thrombectomy and right SFA drug coated balloon angioplasty on 07/10/2023, history of paroxysmal atrial fibrillation on Coumadin,. hyperlipidemia, hypertension, and ischemic cardiomyopathy with EF of 25%.  Presented to hospital with increasing shortness of breath with chest discomfort even on light exertion.  He had exertional dyspnea and leg cramping and called his cardiologist who recommended him to come to the hospital.  Of note patient had graft infection on 08/25/2023 and had washout with antibiotic beads placed in.  He initially received IV Ancef  and has been on oral cefadroxil  at this time.  In the ED workup showed hemoglobin of 7.8 with previous hemoglobin of 9.74 months back.  proBNP  elevated at 1531.  Patient was chest pain free at rest and troponin was 24.  Denied any obvious bleeding.  No history of transfusion in the past.  Patient was Hemoccult negative in the ED. patient was then admitted hospital for further evaluation and treatment.   Hospital Course:   Following conditions were addressed during hospitalization as listed below,  Symptomatic anemia/ Acute CHF exacerbation and exertional chest pain likely due to anemia Received 1 unit of packed PRBC transfusion.  No obvious bleeding.  Anemia could be secondary to recent surgical procedure.  At this time has no dyspnea on exertion and was able to ambulate.  Received 1 dose of IV Lasix .    Ontinue aspirin  beta-blocker.  Initial hemoglobin was 7.8.  Has improved to 9.1.  Review of previous 2D echocardiogram from 08/30/2023 shows LV ejection fraction of 35 to 40% with regional wall motion abnormalities.  Repeat 2D echocardiogram showed LV ejection fraction of 33% with grade 2 diastolic dysfunction.  No significant changes noted.  Patient has been prescribed low-dose Lasix  20 mg daily on discharge starting 12/25/2023.  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 Start date 10/11/2023. He took 6 weeks of IV Cefazolin  prior to oral antibiotics.   Diabetes mellitus type 2- Continue Jardiance  on discharge continue diabetic diet.   Essential hypertension- On Coreg  and Norvas lisinopril .  Disposition.  At this time, patient is stable for disposition home with outpatient PCP follow-up  Medical Consultants:   None.  Procedures:    None Subjective:   Today, patient was seen and examined at bedside denies any pain, nausea, vomiting, fever, chills or rigor.  Wishes to go home.  Discharge Exam:   Vitals:   12/24/23 0446 12/24/23 0726  BP: 128/73 129/85  Pulse: 84 81  Resp: 20 18   Temp: 98 F (36.7 C) 97.7 F (36.5 C)  SpO2: 97% 95%   Vitals:   12/23/23 1935 12/24/23 0028 12/24/23 0446 12/24/23 0726  BP: 122/64 120/72 128/73 129/85  Pulse: 83 84 84 81  Resp: 19 19 20 18   Temp: 98 F (36.7 C) 98.3 F (36.8 C) 98 F (36.7 C) 97.7 F (36.5 C)  TempSrc: Oral Oral Oral Oral  SpO2: 95% 97% 97% 95%  Weight:   79.1 kg   Height:       Body mass index is 27.31 kg/m.  General: Alert awake, not in obvious distress HENT: pupils equally reacting to light, mild pallor noted oral mucosa is moist.  Chest: Decreased breath sounds bilaterally.SABRA No crackles or wheezes.  CVS: S1 &S2 heard. No murmur.  Regular rate and rhythm. Abdomen: Soft, nontender, nondistended.  Bowel sounds are heard.   Extremities: No cyanosis, clubbing or edema.  Peripheral pulses are palpable. Psych: Alert, awake and oriented, normal mood CNS:  No cranial nerve deficits.  Power equal in all extremities.   Skin: Warm and dry.  No rashes noted.  The results of significant diagnostics from this hospitalization (including imaging, microbiology, ancillary and laboratory) are listed below for reference.     Diagnostic Studies:   ECHOCARDIOGRAM COMPLETE Result Date: 12/23/2023    ECHOCARDIOGRAM REPORT   Patient Name:   [name] Date of Exam: 12/23/2023 Medical Rec #:  [id number]     Height:       67.0 in Accession #:    [id number]    Weight:       178.8 lb Date of Birth:  Sep 28, 1945     BSA:          1.928 m Patient Age:    77 years      BP:           145/69 mmHg Patient Gender: M             HR:           74 bpm. Exam Location:  Inpatient Procedure: 2D Echo (Both Spectral and Color Flow Doppler were utilized during            procedure). Indications:    dyspnea  History:        Patient has prior history of Echocardiogram examinations, most                 recent 05/23/2023. Cardiomyopathy, CAD, PAD, Arrythmias:PVC and                 Paroxysmal a-fib; Risk Factors:Hypertension, Dyslipidemia,                  Diabetes and Former Smoker.  Sonographer:    Tinnie Barefoot RDCS Referring Phys: [phone] [facility] Korrin Waterfield IMPRESSIONS  1. Inferior and inferoseptal akinesis with overall moderate to severe LV dysfunction.  2. Left ventricular ejection fraction, by estimation, is 30 to 35%. The left ventricle has moderately decreased function. The left ventricle demonstrates regional wall motion abnormalities (see scoring diagram/findings for description). Left ventricular  diastolic parameters are consistent with Grade II diastolic dysfunction (pseudonormalization). Elevated left atrial pressure.  3. Right  ventricular systolic function is normal. The right ventricular size is normal.  4. Left atrial size was moderately dilated.  5. The mitral valve is normal in structure. Trivial mitral valve regurgitation. No evidence of mitral stenosis.  6. The aortic valve is tricuspid. Aortic valve regurgitation is not visualized. Aortic valve sclerosis/calcification is present, without any evidence of aortic stenosis.  7. The inferior vena cava is normal in size with greater than 50% respiratory variability, suggesting right atrial pressure of 3 mmHg.  8. Cannot exclude a small PFO. FINDINGS  Left Ventricle: Left ventricular ejection fraction, by estimation, is 30 to 35%. The left ventricle has moderately decreased function. The left ventricle demonstrates regional wall motion abnormalities. The left ventricular internal cavity size was normal in size. There is no left ventricular hypertrophy. Left ventricular diastolic parameters are consistent with Grade II diastolic dysfunction (pseudonormalization). Elevated left atrial pressure. Right Ventricle: The right ventricular size is normal. Right ventricular systolic function is normal. Left Atrium: Left atrial size was moderately dilated. Right Atrium: Right atrial size was normal in size. Pericardium: There is no evidence of pericardial effusion. Mitral Valve: The mitral valve is normal in  structure. Trivial mitral valve regurgitation. No evidence of mitral valve stenosis. Tricuspid Valve: The tricuspid valve is normal in structure. Tricuspid valve regurgitation is trivial. No evidence of tricuspid stenosis. Aortic Valve: The aortic valve is tricuspid. Aortic valve regurgitation is not visualized. Aortic valve sclerosis/calcification is present, without any evidence of aortic stenosis. Pulmonic Valve: The pulmonic valve was normal in structure. Pulmonic valve regurgitation is not visualized. No evidence of pulmonic stenosis. Aorta: The aortic root is normal in size and structure. Venous: The inferior vena cava is normal in size with greater than 50% respiratory variability, suggesting right atrial pressure of 3 mmHg. IAS/Shunts: Cannot exclude a small PFO. Additional Comments: Inferior and inferoseptal akinesis with overall moderate to severe LV dysfunction.  LEFT VENTRICLE PLAX 2D LVIDd:         5.30 cm      Diastology LVIDs:         4.30 cm      LV e' medial:    5.66 cm/s LV PW:         1.10 cm      LV E/e' medial:  16.4 LV IVS:        1.00 cm      LV e' lateral:   5.77 cm/s LVOT diam:     1.90 cm      LV E/e' lateral: 16.1 LV SV:         47 LV SV Index:   24 LVOT Area:     2.84 cm  LV Volumes (MOD) LV vol d, MOD A2C: 147.0 ml LV vol d, MOD A4C: 133.0 ml LV vol s, MOD A2C: 95.3 ml LV vol s, MOD A4C: 91.8 ml LV SV MOD A2C:     51.7 ml LV SV MOD A4C:     133.0 ml LV SV MOD BP:      45.9 ml RIGHT VENTRICLE             IVC RV Basal diam:  3.10 cm     IVC diam: 1.90 cm RV S prime:     11.40 cm/s TAPSE (M-mode): 1.9 cm LEFT ATRIUM             Index        RIGHT ATRIUM           Index LA diam:  4.40 cm 2.28 cm/m   RA Area:     19.40 cm LA Vol (A2C):   97.0 ml 50.31 ml/m  RA Volume:   57.30 ml  29.72 ml/m LA Vol (A4C):   60.5 ml 31.38 ml/m LA Biplane Vol: 78.4 ml 40.66 ml/m  AORTIC VALVE LVOT Vmax:   89.00 cm/s LVOT Vmean:  55.800 cm/s LVOT VTI:    0.166 m  AORTA Ao Root diam: 3.00 cm MITRAL  VALVE MV Area (PHT): 3.77 cm    SHUNTS MV Decel Time: 201 msec    Systemic VTI:  0.17 m MV E velocity: 92.70 cm/s  Systemic Diam: 1.90 cm MV A velocity: 90.00 cm/s MV E/A ratio:  1.03 Redell Shallow MD Electronically signed by Redell Shallow MD Signature Date/Time: 12/23/2023/4:13:25 PM    Final    DG Chest 2 View Result Date: 12/22/2023 CLINICAL DATA:  Chest pain x1 week. EXAM: CHEST - 2 VIEW COMPARISON:  August 11, 2023 FINDINGS: The heart size and mediastinal contours are within normal limits. Marked severity calcification of the aortic arch is seen. Both lungs are clear. The visualized skeletal structures are unremarkable. IMPRESSION: No active cardiopulmonary disease. Electronically Signed   By: Suzen Dials M.D.   On: 12/22/2023 13:55     Labs:   Basic Metabolic Panel: Recent Labs  Lab 12/22/23 1305 12/23/23 0748 12/24/23 0218  NA 136 139 136  K 4.4 4.3 3.8  CL 103 108 104  CO2 22 21* 21*  GLUCOSE 360* 148* 202*  BUN 16 14 22   CREATININE 1.32* 1.30* 1.70*  CALCIUM  8.9 8.8* 8.9  MG  --   --  2.0   GFR Estimated Creatinine Clearance: 33.5 mL/min (A) (by C-G formula based on SCr of 1.7 mg/dL (H)). Liver Function Tests: No results for input(s): AST, ALT, ALKPHOS, BILITOT, PROT, ALBUMIN  in the last 168 hours. No results for input(s): LIPASE, AMYLASE in the last 168 hours. No results for input(s): AMMONIA in the last 168 hours. Coagulation profile No results for input(s): INR, PROTIME in the last 168 hours.  CBC: Recent Labs  Lab 12/22/23 1305 12/22/23 2236 12/23/23 0748 12/24/23 0218  WBC 5.3  --  5.8 6.6  HGB 7.8* 7.4* 8.6* 9.1*  HCT 26.5* 26.1* 28.8* 30.3*  MCV 75.7*  --  78.3* 75.9*  PLT 269  --  241 264   Cardiac Enzymes: No results for input(s): CKTOTAL, CKMB, CKMBINDEX, TROPONINI in the last 168 hours. BNP: Invalid input(s): POCBNP CBG: Recent Labs  Lab 12/23/23 0629 12/23/23 1127 12/23/23 1617 12/23/23 2116  12/24/23 0611  GLUCAP 128* 205* 273* 255* 181*   D-Dimer No results for input(s): DDIMER in the last 72 hours. Hgb A1c No results for input(s): HGBA1C in the last 72 hours. Lipid Profile No results for input(s): CHOL, HDL, LDLCALC, TRIG, CHOLHDL, LDLDIRECT in the last 72 hours. Thyroid  function studies No results for input(s): TSH, T4TOTAL, T3FREE, THYROIDAB in the last 72 hours.  Invalid input(s): FREET3 Anemia work up No results for input(s): VITAMINB12, FOLATE, FERRITIN, TIBC, IRON, RETICCTPCT in the last 72 hours. Microbiology No results found for this or any previous visit (from the past 240 hours).   Discharge Instructions:   Discharge Instructions     Diet - low sodium heart healthy   Complete by: As directed    Discharge instructions   Complete by: As directed    Follow up with your primary care provider in one week and check blood work at that time. Follow  up with your cardiologist as has been scheduled. Take all medications at home including antibiotics. Water  pill has been added on discharge. Seek medical attention for worsening symptoms.   Increase activity slowly   Complete by: As directed       Allergies as of 12/24/2023       Reactions   Lisinopril  Swelling, Rash   Rash - face and tounge swell   Levaquin [levofloxacin]         Medication List     TAKE these medications    acetaminophen  500 MG tablet Commonly known as: TYLENOL  Take 1,000 mg by mouth every 6 (six) hours as needed for moderate pain (pain score 4-6).   amLODipine  5 MG tablet Commonly known as: NORVASC  TAKE 1 TABLET(5 MG) BY MOUTH DAILY   aspirin  EC 81 MG tablet Take 1 tablet (81 mg total) by mouth daily. Swallow whole.   carvedilol  12.5 MG tablet Commonly known as: COREG  Take 1 tablet (12.5 mg total) by mouth 2 (two) times daily with a meal.   cefadroxil  500 MG capsule Commonly known as: DURICEF Take 2 capsules (1,000 mg total) by mouth 2  (two) times daily.   cetirizine  10 MG tablet Commonly known as: ZYRTEC  Take 1 tablet (10 mg total) by mouth daily.   cyclobenzaprine  10 MG tablet Commonly known as: FLEXERIL  Take 1 tablet (10 mg total) by mouth 3 (three) times daily as needed for muscle spasms.   ezetimibe  10 MG tablet Commonly known as: ZETIA  Take 1 tablet (10 mg total) by mouth daily.   furosemide  20 MG tablet Commonly known as: Lasix  Take 1 tablet (20 mg total) by mouth every morning. Start taking on: December 25, 2023   insulin  glargine 100 unit/mL Sopn Commonly known as: LANTUS  46 units subcut daily at nighttime What changed:  how much to take how to take this when to take this additional instructions   isosorbide  dinitrate 10 MG tablet Commonly known as: ISORDIL  Take 1 tablet (10 mg total) by mouth 2 (two) times daily.   Jardiance  10 MG Tabs tablet Generic drug: empagliflozin  Take 1 tablet (10 mg total) by mouth daily.   loperamide  2 MG tablet Commonly known as: IMODIUM  A-D Take 2 mg by mouth 4 (four) times daily as needed for diarrhea or loose stools.   nitroGLYCERIN  0.4 MG SL tablet Commonly known as: NITROSTAT  Place 0.4 mg under the tongue every 5 (five) minutes as needed for chest pain.   NovoLOG  FlexPen 100 UNIT/ML FlexPen Generic drug: insulin  aspart Inject 12 Units into the skin 3 (three) times daily with meals. What changed:  when to take this reasons to take this   oxyCODONE -acetaminophen  5-325 MG tablet Commonly known as: Percocet Take 1 tablet by mouth every 6 (six) hours as needed for severe pain (pain score 7-10).   rivaroxaban  20 MG Tabs tablet Commonly known as: XARELTO  Take 1 tablet (20 mg total) by mouth daily with supper.   rosuvastatin  40 MG tablet Commonly known as: CRESTOR  TAKE 1 TABLET(40 MG) BY MOUTH DAILY   tamsulosin  0.4 MG Caps capsule Commonly known as: FLOMAX  Take 0.8 mg by mouth every evening.        Follow-up Information     Fleeta Valeria Mayo, MD  Follow up in 1 week(s).   Specialty: Internal Medicine Contact information: [address] [place] [address] [phone]                  Time coordinating discharge: 80  minutes  Signed:  Areonna Bran  [facility] 12/24/2023, 11:45 AM

## 2023-12-24 NOTE — Plan of Care (Signed)
   Problem: Education: Goal: Knowledge of General Education information will improve Description: Including pain rating scale, medication(s)/side effects and non-pharmacologic comfort measures Outcome: Progressing   Problem: Clinical Measurements: Goal: Will remain free from infection Outcome: Progressing Goal: Diagnostic test results will improve Outcome: Progressing   Problem: Nutrition: Goal: Adequate nutrition will be maintained Outcome: Progressing   Problem: Coping: Goal: Level of anxiety will decrease Outcome: Progressing

## 2023-12-27 ENCOUNTER — Encounter: Payer: Self-pay | Admitting: Internal Medicine

## 2023-12-27 ENCOUNTER — Ambulatory Visit: Admitting: Internal Medicine

## 2023-12-27 VITALS — BP 144/74 | HR 76 | Temp 97.3°F | Resp 18 | Ht 67.0 in | Wt 178.4 lb

## 2023-12-27 DIAGNOSIS — I5042 Chronic combined systolic (congestive) and diastolic (congestive) heart failure: Secondary | ICD-10-CM | POA: Diagnosis not present

## 2023-12-27 DIAGNOSIS — D500 Iron deficiency anemia secondary to blood loss (chronic): Secondary | ICD-10-CM | POA: Insufficient documentation

## 2023-12-27 DIAGNOSIS — D649 Anemia, unspecified: Secondary | ICD-10-CM | POA: Diagnosis not present

## 2023-12-27 DIAGNOSIS — E1165 Type 2 diabetes mellitus with hyperglycemia: Secondary | ICD-10-CM | POA: Insufficient documentation

## 2023-12-27 DIAGNOSIS — Z23 Encounter for immunization: Secondary | ICD-10-CM | POA: Diagnosis not present

## 2023-12-27 MED ORDER — TRULICITY 0.75 MG/0.5ML ~~LOC~~ SOAJ: 0.7500 mg | SUBCUTANEOUS | 0 refills | Status: DC

## 2023-12-27 MED ORDER — TRULICITY 3 MG/0.5ML ~~LOC~~ SOAJ: 3.0000 mg | SUBCUTANEOUS | 0 refills | Status: DC

## 2023-12-27 MED ORDER — TRULICITY 1.5 MG/0.5ML ~~LOC~~ SOAJ: 1.5000 mg | SUBCUTANEOUS | 0 refills | Status: DC

## 2023-12-27 MED ORDER — FUROSEMIDE 20 MG PO TABS: 20.0000 mg | ORAL_TABLET | Freq: Every morning | ORAL | 1 refills | Status: DC

## 2023-12-27 NOTE — Assessment & Plan Note (Signed)
 They felt his anemia was due to his recent procedure.  I asked him to come back in 1 week to repeat a CBC.

## 2023-12-27 NOTE — Assessment & Plan Note (Signed)
 He increased his lantus  but his sugars still remain elevated.  We had him on ozempic  but this was too expensive.  I am going to add trulicity  to his regimen.  We discussed doing meal time coverage with short acting insulin  but he states he eats out most of time and this would be difficult to carry insulin  pens.  We will continue his currrent diabetic meds and add trulicity .  We need his diabetic eye report from [facility].

## 2023-12-27 NOTE — Addendum Note (Signed)
 Addended by: VIDA SALINES on: 12/27/2023 10:22 AM   Modules accepted: Orders

## 2023-12-27 NOTE — Progress Notes (Signed)
 Office Visit  Subjective   Patient ID: [name]   DOB: November 07, 1945   Age: 78 y.o.   MRN: [id number]   Chief Complaint Chief Complaint  Patient presents with   Follow-up    3 Month Follow Up     History of Present Illness The patient returns today for a hospital followup where he was admitted to [facility] from from 12/22/2023 until 12/24/2023 due increasing SOB and chest discomfort.  He has a past medical history significant for ischemic limb treated with mechanical thrombectomy and right SFA drug coated balloon angioplasty on 07/10/2023, history of paroxysmal atrial fibrillation on Coumadin,. hyperlipidemia, hypertension, and ischemic cardiomyopathy with EF of 25%. His ER workup revealed a HgB of 7.8 where his HgB was previously 11.4 in 09/2023.  He denied any hemoptysis, melena, BRBPR or hematuria.  He had a negative hemoccult done in the ER.  They felt he had symptomatic anemia with acute CHF exacerbation and exertional chest pain likely due to anemia.  He did receive 1 unit of packed PRBC transfusion.  They felt his anemia could be secondary to recent surgical procedure.  At discharge, he had no dyspnea on exertion and was able to ambulate.  Received 1 dose of IV Lasix .    They continued him on an aspirin  and his beta-blocker.  His discharge hemoglobin was 9.1.  His previous echocardiogram from 08/30/2023 shows LV ejection fraction of 35 to 40% with regional wall motion abnormalities. They repeated an ECHO on 12/23/2023 and this showed iInferior and inferoseptal akinesis with overall moderate to severe LV dysfunction.  His left ventricular ejection fraction, by estimation, is 30 to 35%. The left ventricle has moderately decreased function. The left ventricle demonstrates regional wall motion abnormalities. Left ventricular  diastolic parameters are consistent with Grade II diastolic dysfunction (pseudonormalization). Elevated left atrial pressure. His right ventricular systolic function is normal. The  right ventricular size is normal.  Left atrial size was moderately dilated. They could not exclude a small PFO. The patient was prescribed low-dose Lasix  20 mg daily on discharge starting 12/25/2023.  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 Start date 10/11/2023. He took 6 weeks of IV Cefazolin  prior to oral antibiotics.    The patient is a 78 year old Caucasian/White male who returns for a follow-up visit for his T2 diabetes.  He did have his HgBA1c repeated by cardiology on 12/08/2023 and this was 10.5%.  I saw him 3 months ago and his A1c was 9.1% at that time.  I asked him to increase his lantus .  He was on ozempic  but this was too expensive and we went to lantus  injection.    We instead started him on lantus  instead of ozempic .  Again, he was diagnosed with T2 diabetes in the 1990's.  He remains on Jardiance  10mg  daily, glipizide  10mg  BID and Lantus  50 Units subcut night.  He eats 2 meals with breakfast and supper.  He is not walking as much as they would like. He specifically denies unexplained fatigue, palpitations, unexplained abdominal pain, nausea or vomiting or hypoglycemia. He does check blood sugars daily where they range from 120-200.  He came in fasting today in anticipation of lab work. He stopped metformin in the past due to diarrhea.  His last HgBa1c was done 3 months ago and was 10.6%.  He has no complications of  diabetic retinopathy, neuropathy, or nephropathy.  He does have PVD and CAD associated with his diabetes.   Again, he did miss his yearly diabetic eye exam but states he had it done at [facility] a few months ago but we do not have this report.     Past Medical History Past Medical History:  Diagnosis Date   Acute low back pain 01/12/2015   Acute lower limb ischemia 07/11/2023   Acute pain of both shoulders 07/22/2022   AKI (acute kidney injury) (HCC) 05/24/2022    Arthritis    Atherosclerosis of native arteries of the extremities with intermittent claudication 08/13/2013   Atherosclerosis of native artery of extremity with intermittent claudication (HCC) 08/13/2013   IMO SNOMED Dx Update Oct 2024     Backache 02/08/2013   Benign prostatic hyperplasia with urinary frequency 02/23/2023   Bladder cancer (HCC)    resection x3   BMI 27.0-27.9,adult 02/23/2023   Cellulitis 07/17/2023   Chronic combined systolic (congestive) and diastolic (congestive) heart failure (HCC) 07/22/2022   Chronic left shoulder pain 05/24/2023   Chronic right shoulder pain 09/02/2022   Chronic systolic CHF (congestive heart failure) (HCC)    CKD stage 3b, GFR 30-44 ml/min (HCC) 07/03/2023   Coronary artery disease    status post DMI RX Taxus stent RCA 2006 with susequent Stent LAD and subsequent  stent thrombosis RCA unable to be opened 2006 -neg mv 10/2008, 10/16/17 ISR to pLAD with PTCA/DES, CTO of RCA with collaterals, EF 25%   Coronary artery disease involving native coronary artery of native heart with unstable angina pectoris (HCC)    Current moderate episode of major depressive disorder without prior episode (HCC) 03/28/2022   Diverticulitis 06/10/2022   Diverticulitis large intestine 05/11/2021   Diverticulitis of large intestine with abscess 05/24/2022   Diverticulitis of sigmoid colon 05/11/2021   DM2 (diabetes mellitus, type 2) (HCC) 02/09/2022   status post bilateral aortobifemoral bypass [id number] with recent fem to fembypass April  2011 per DR. Early     Dyssynergic defecation 03/21/2023   Essential hypertension 07/22/2022   Fatigue 03/13/2023   History of bladder cancer 02/23/2023   History of colonic diverticulitis 03/21/2023   Hypercholesterolemia 10/28/2022   HYPERLIPIDEMIA-MIXED 09/22/2008   Qualifier: Diagnosis of  By: Justina Kos     Hypothyroidism 05/24/2022   Ischemic cardiomyopathy    ejection fraction of 40-45%   Lethargy 03/28/2022   Lumbar  spinal stenosis    Myocardial infarction (HCC)    I've had 4 (10/16/2017)   Need for prophylactic vaccination and inoculation against influenza 03/28/2022   NSVT (nonsustained ventricular tachycardia) (HCC)    PAD (peripheral artery disease) (HCC) 07/17/2023   PAF (paroxysmal atrial fibrillation) (HCC) 02/09/2022   Paroxysmal atrial fibrillation (HCC)    Peripheral arterial disease (HCC)    Peripheral artery disease (HCC) 07/09/2023   Poorly controlled T2 diabetes mellitus (HCC) 09/27/2022   Protein-calorie malnutrition, severe 05/29/2022   PVC's (premature ventricular contractions)    PVD 09/22/2008   Qualifier: Diagnosis of  By: Jaramillo, Luz     PVD (peripheral vascular disease) (HCC) 02/23/2023   Rotator cuff arthropathy of right shoulder 09/27/2022   Sigmoid diverticulitis 06/30/2023   Status post coronary artery stent placement    Status post Hartmann's procedure (HCC) 03/21/2023   TOBACCO ABUSE 05/29/2009   Qualifier: Diagnosis of  By: Velinda, RN, BSN, Avelina CROME    Trigger ring finger of right hand 03/29/2023   Type 2 diabetes mellitus with other circulatory complications (  HCC) 07/22/2022   Type II diabetes mellitus (HCC)    Unstable angina (HCC) 10/16/2017     Allergies Allergies  Allergen Reactions   Lisinopril  Swelling and Rash    Rash - face and tounge swell   Levaquin [Levofloxacin]      Medications  Current Outpatient Medications:    acetaminophen  (TYLENOL ) 500 MG tablet, Take 1,000 mg by mouth every 6 (six) hours as needed for moderate pain (pain score 4-6)., Disp: , Rfl:    amLODipine  (NORVASC ) 5 MG tablet, TAKE 1 TABLET(5 MG) BY MOUTH DAILY, Disp: 90 tablet, Rfl: 2   aspirin  EC 81 MG tablet, Take 1 tablet (81 mg total) by mouth daily. Swallow whole., Disp: 90 tablet, Rfl: 3   carvedilol  (COREG ) 12.5 MG tablet, Take 1 tablet (12.5 mg total) by mouth 2 (two) times daily with a meal., Disp: 180 tablet, Rfl: 3   cefadroxil  (DURICEF) 500 MG capsule, Take 2  capsules (1,000 mg total) by mouth 2 (two) times daily., Disp: 120 capsule, Rfl: 5   cetirizine  (ZYRTEC ) 10 MG tablet, Take 1 tablet (10 mg total) by mouth daily., Disp: 90 tablet, Rfl: 0   cyclobenzaprine  (FLEXERIL ) 10 MG tablet, Take 1 tablet (10 mg total) by mouth 3 (three) times daily as needed for muscle spasms., Disp: 15 tablet, Rfl: 0   ezetimibe  (ZETIA ) 10 MG tablet, Take 1 tablet (10 mg total) by mouth daily., Disp: 90 tablet, Rfl: 3   furosemide  (LASIX ) 20 MG tablet, Take 1 tablet (20 mg total) by mouth every morning., Disp: 30 tablet, Rfl: 0   insulin  aspart (NOVOLOG ) 100 UNIT/ML FlexPen, Inject 12 Units into the skin 3 (three) times daily with meals. (Patient taking differently: Inject 12 Units into the skin 3 (three) times daily as needed for high blood sugar.), Disp: 15 mL, Rfl: 11   insulin  glargine (LANTUS ) 100 unit/mL SOPN, 46 units subcut daily at nighttime (Patient taking differently: Inject 50 Units into the skin at bedtime.), Disp: 15 mL, Rfl: 3   isosorbide  dinitrate (ISORDIL ) 10 MG tablet, Take 1 tablet (10 mg total) by mouth 2 (two) times daily., Disp: 180 tablet, Rfl: 3   JARDIANCE  10 MG TABS tablet, Take 1 tablet (10 mg total) by mouth daily., Disp: 30 tablet, Rfl: 2   loperamide  (IMODIUM  A-D) 2 MG tablet, Take 2 mg by mouth 4 (four) times daily as needed for diarrhea or loose stools., Disp: , Rfl:    nitroGLYCERIN  (NITROSTAT ) 0.4 MG SL tablet, Place 0.4 mg under the tongue every 5 (five) minutes as needed for chest pain., Disp: , Rfl:    oxyCODONE -acetaminophen  (PERCOCET) 5-325 MG tablet, Take 1 tablet by mouth every 6 (six) hours as needed for severe pain (pain score 7-10)., Disp: 20 tablet, Rfl: 0   rivaroxaban  (XARELTO ) 20 MG TABS tablet, Take 1 tablet (20 mg total) by mouth daily with supper., Disp: 30 tablet, Rfl: 3   rosuvastatin  (CRESTOR ) 40 MG tablet, TAKE 1 TABLET(40 MG) BY MOUTH DAILY, Disp: 90 tablet, Rfl: 1   tamsulosin  (FLOMAX ) 0.4 MG CAPS capsule, Take 0.8 mg by  mouth every evening., Disp: , Rfl:    Review of Systems Review of Systems  Constitutional:  Negative for chills, fever, malaise/fatigue and weight loss.  Respiratory:  Negative for cough and shortness of breath.   Cardiovascular:  Negative for chest pain, palpitations and leg swelling.  Gastrointestinal:  Negative for abdominal pain, constipation, diarrhea, heartburn, nausea and vomiting.  Genitourinary:  Negative for frequency.  Musculoskeletal:  Negative for myalgias.  Skin:  Negative for itching and rash.  Neurological:  Negative for dizziness, weakness and headaches.  Endo/Heme/Allergies:  Negative for polydipsia.       Objective:    Vitals BP (!) 144/74   Pulse 76   Temp (!) 97.3 F (36.3 C) (Temporal)   Resp 18   Ht 5' 7 (1.702 m)   Wt 178 lb 6.4 oz (80.9 kg)   SpO2 99%   BMI 27.94 kg/m    Physical Examination Physical Exam Constitutional:      Appearance: Normal appearance. He is not ill-appearing.  Cardiovascular:     Rate and Rhythm: Normal rate and regular rhythm.     Pulses: Normal pulses.     Heart sounds: No murmur heard.    No friction rub. No gallop.  Pulmonary:     Effort: Pulmonary effort is normal. No respiratory distress.     Breath sounds: No wheezing, rhonchi or rales.  Abdominal:     General: Abdomen is flat. Bowel sounds are normal. There is no distension.     Palpations: Abdomen is soft.     Tenderness: There is no abdominal tenderness.  Musculoskeletal:     Right lower leg: No edema.     Left lower leg: No edema.  Skin:    General: Skin is warm and dry.     Findings: No rash.  Neurological:     General: No focal deficit present.     Mental Status: He is alert and oriented to person, place, and time.  Psychiatric:        Mood and Affect: Mood normal.        Behavior: Behavior normal.        Assessment & Plan:   Inadequately controlled diabetes mellitus (HCC) He increased his lantus  but his sugars still remain elevated.  We had  him on ozempic  but this was too expensive.  I am going to add trulicity  to his regimen.  We discussed doing meal time coverage with short acting insulin  but he states he eats out most of time and this would be difficult to carry insulin  pens.  We will continue his currrent diabetic meds and add trulicity .  We need his diabetic eye report from [facility].  Anemia They felt his anemia was due to his recent procedure.  I asked him to come back in 1 week to repeat a CBC.  Chronic combined systolic (congestive) and diastolic (congestive) heart failure (HCC) He is now on lasix  20mg  daily.  He will come back next week for a CBC and CMP.    Return in about 3 months (around 03/27/2024).   Selinda Fleeta Finger, MD

## 2023-12-27 NOTE — Assessment & Plan Note (Signed)
 He is now on lasix  20mg  daily.  He will come back next week for a CBC and CMP.

## 2024-01-02 ENCOUNTER — Ambulatory Visit: Attending: Cardiology

## 2024-01-02 DIAGNOSIS — I779 Disorder of arteries and arterioles, unspecified: Secondary | ICD-10-CM | POA: Diagnosis not present

## 2024-01-04 ENCOUNTER — Ambulatory Visit (INDEPENDENT_AMBULATORY_CARE_PROVIDER_SITE_OTHER): Admitting: Internal Medicine

## 2024-01-04 ENCOUNTER — Encounter: Payer: Self-pay | Admitting: Internal Medicine

## 2024-01-04 ENCOUNTER — Telehealth: Payer: Self-pay

## 2024-01-04 VITALS — BP 120/82 | HR 83 | Ht 67.0 in | Wt 182.4 lb

## 2024-01-04 DIAGNOSIS — Z794 Long term (current) use of insulin: Secondary | ICD-10-CM | POA: Diagnosis not present

## 2024-01-04 DIAGNOSIS — E1122 Type 2 diabetes mellitus with diabetic chronic kidney disease: Secondary | ICD-10-CM | POA: Diagnosis not present

## 2024-01-04 DIAGNOSIS — E1165 Type 2 diabetes mellitus with hyperglycemia: Secondary | ICD-10-CM | POA: Diagnosis not present

## 2024-01-04 DIAGNOSIS — E1159 Type 2 diabetes mellitus with other circulatory complications: Secondary | ICD-10-CM | POA: Diagnosis not present

## 2024-01-04 DIAGNOSIS — N1831 Chronic kidney disease, stage 3a: Secondary | ICD-10-CM

## 2024-01-04 MED ORDER — TIRZEPATIDE 5 MG/0.5ML ~~LOC~~ SOAJ: 5.0000 mg | SUBCUTANEOUS | 2 refills | Status: DC

## 2024-01-04 MED ORDER — GLIPIZIDE 5 MG PO TABS
5.0000 mg | ORAL_TABLET | Freq: Two times a day (BID) | ORAL | 3 refills | Status: DC
Start: 2024-01-04 — End: 2024-03-06

## 2024-01-04 MED ORDER — INSULIN GLARGINE 100 UNITS/ML SOLOSTAR PEN: 40.0000 [IU] | PEN_INJECTOR | Freq: Every day | SUBCUTANEOUS | 4 refills | Status: DC

## 2024-01-04 MED ORDER — INSULIN PEN NEEDLE 32G X 4 MM MISC: 1.0000 | Freq: Every day | 3 refills | Status: AC

## 2024-01-04 MED ORDER — EMPAGLIFLOZIN 10 MG PO TABS: 10.0000 mg | ORAL_TABLET | Freq: Every day | ORAL | 3 refills | Status: DC

## 2024-01-04 NOTE — Progress Notes (Signed)
 Name: [name]  MRN/ DOB: [id number], 1946-02-18   Age/ Sex: 78 y.o., male    PCP: Fleeta Valeria Mayo, MD   Reason for Endocrinology Evaluation: Type 2 Diabetes Mellitus     Date of Initial Endocrinology Visit: 01/04/2024     PATIENT IDENTIFIER: Mr. CRISTOFER YAFFE is a 78 y.o. male with a past medical history of DM, HTN, and dyslipidemia, CAD, paroxysmal A-fib, Hx bladder cancer. The patient presented for initial endocrinology clinic visit on 01/04/2024 for consultative assistance with his diabetes management.    HPI: Mr. Hillhouse is accompanied by his girlfriend    Diagnosed with DM many years ago Prior Medications tried/Intolerance: Ozempic  - insurance issues . Started Trulicity  11/2023 Currently checking blood sugars multiple x / day  Hypoglycemia episodes : yes             Frequency: rare overnight  Hemoglobin A1c has ranged from 7.4% in 2024, peaking at 10.6% in 2025.   In terms of diet, the patient eats 2 times a day, occasional snacking , drinks sugar sweetened beverages   Patient follows with cardiology and vascular surgery He also currently follows with infectious disease  No nausea or vomiting  No constipation or diarrhea  Has occasional tingling  of feet   Follows with Urology ( Dr. Meredeth)  HOME DIABETES REGIMEN: Jardiance  10 mg daily Trulicity  0.75 mg weekly Lantus  50 units daily  NovoLog  12 units TID- not taking      Statin: Yes ACE-I/ARB: Intolerant to lisinopril  due to face, tongue swelling and rash CONTINUOUS GLUCOSE MONITORING RECORD INTERPRETATION    Dates of Recording: 9/5 - 01/04/2024  Sensor description: Freestyle libre 3  Results statistics:   CGM use % of time 95  Average and SD 196/35.7  Time in range    42    %  % Time Above 180 39  % Time above 250 17  % Time Below target 2   Glycemic patterns summary: BGs trend down overnight and increased throughout the day  Hyperglycemic episodes postprandial  Hypoglycemic episodes occurred  overnight  Overnight periods: Variable    DIABETIC COMPLICATIONS: Microvascular complications:  CKD,  Denies:DR Last eye exam: Completed 2025  Macrovascular complications:  CAD, CHF, PAD Denies: CVA   PAST HISTORY: Past Medical History:  Past Medical History:  Diagnosis Date   Acute low back pain 01/12/2015   Acute lower limb ischemia 07/11/2023   Acute pain of both shoulders 07/22/2022   AKI (acute kidney injury) (HCC) 05/24/2022   Arthritis    Atherosclerosis of native arteries of the extremities with intermittent claudication 08/13/2013   Atherosclerosis of native artery of extremity with intermittent claudication (HCC) 08/13/2013   IMO SNOMED Dx Update Oct 2024     Backache 02/08/2013   Benign prostatic hyperplasia with urinary frequency 02/23/2023   Bladder cancer (HCC)    resection x3   BMI 27.0-27.9,adult 02/23/2023   Cellulitis 07/17/2023   Chronic combined systolic (congestive) and diastolic (congestive) heart failure (HCC) 07/22/2022   Chronic left shoulder pain 05/24/2023   Chronic right shoulder pain 09/02/2022   Chronic systolic CHF (congestive heart failure) (HCC)    CKD stage 3b, GFR 30-44 ml/min (HCC) 07/03/2023   Coronary artery disease    status post DMI RX Taxus stent RCA 2006 with susequent Stent LAD and subsequent  stent thrombosis RCA unable to be opened 2006 -neg mv 10/2008, 10/16/17 ISR to pLAD with PTCA/DES, CTO of RCA with collaterals, EF 25%   Coronary artery  disease involving native coronary artery of native heart with unstable angina pectoris (HCC)    Current moderate episode of major depressive disorder without prior episode (HCC) 03/28/2022   Diverticulitis 06/10/2022   Diverticulitis large intestine 05/11/2021   Diverticulitis of large intestine with abscess 05/24/2022   Diverticulitis of sigmoid colon 05/11/2021   DM2 (diabetes mellitus, type 2) (HCC) 02/09/2022   status post bilateral aortobifemoral bypass [id number] with recent fem to  fembypass April  2011 per DR. Early     Dyssynergic defecation 03/21/2023   Essential hypertension 07/22/2022   Fatigue 03/13/2023   History of bladder cancer 02/23/2023   History of colonic diverticulitis 03/21/2023   Hypercholesterolemia 10/28/2022   HYPERLIPIDEMIA-MIXED 09/22/2008   Qualifier: Diagnosis of  By: Justina Kos     Hypothyroidism 05/24/2022   Ischemic cardiomyopathy    ejection fraction of 40-45%   Lethargy 03/28/2022   Lumbar spinal stenosis    Myocardial infarction (HCC)    I've had 4 (10/16/2017)   Need for prophylactic vaccination and inoculation against influenza 03/28/2022   NSVT (nonsustained ventricular tachycardia) (HCC)    PAD (peripheral artery disease) (HCC) 07/17/2023   PAF (paroxysmal atrial fibrillation) (HCC) 02/09/2022   Paroxysmal atrial fibrillation (HCC)    Peripheral arterial disease (HCC)    Peripheral artery disease (HCC) 07/09/2023   Poorly controlled T2 diabetes mellitus (HCC) 09/27/2022   Protein-calorie malnutrition, severe 05/29/2022   PVC's (premature ventricular contractions)    PVD 09/22/2008   Qualifier: Diagnosis of  By: Justina Kos     PVD (peripheral vascular disease) (HCC) 02/23/2023   Rotator cuff arthropathy of right shoulder 09/27/2022   Sigmoid diverticulitis 06/30/2023   Status post coronary artery stent placement    Status post Hartmann's procedure (HCC) 03/21/2023   TOBACCO ABUSE 05/29/2009   Qualifier: Diagnosis of  By: Velinda, RN, BSN, Avelina CROME    Trigger ring finger of right hand 03/29/2023   Type 2 diabetes mellitus with other circulatory complications (HCC) 07/22/2022   Type II diabetes mellitus (HCC)    Unstable angina (HCC) 10/16/2017   Past Surgical History:  Past Surgical History:  Procedure Laterality Date   ABDOMINAL AORTOGRAM W/LOWER EXTREMITY Right 07/10/2023   Procedure: ABDOMINAL AORTOGRAM W/LOWER EXTREMITY;  Surgeon: Pearline Norman RAMAN, MD;  Location: [facility] INVASIVE CV LAB;  Service:  Cardiovascular;  Laterality: Right;   AORTA - BILATERAL FEMORAL ARTERY BYPASS GRAFT Bilateral 1992   APPENDECTOMY     APPLICATION OF WOUND VAC Left 08/26/2023   Procedure: APPLICATION, WOUND VAC left groin;  Surgeon: Sheree Penne Bruckner, MD;  Location: [facility] OR;  Service: Vascular;  Laterality: Left;   APPLICATION, SKIN SUBSTITUTE Left 08/29/2023   Procedure: APPLICATION, SKIN SUBSTITUTE;  Surgeon: Sheree Penne Bruckner, MD;  Location: [facility] OR;  Service: Vascular;  Laterality: Left;   BACK SURGERY     COLECTOMY WITH COLOSTOMY CREATION/HARTMANN PROCEDURE N/A 05/26/2022   Procedure: COLECTOMY WITH COLOSTOMY CREATION/HARTMANN PROCEDURE;  Surgeon: Paola Dreama SAILOR, MD;  Location: [facility] OR;  Service: General;  Laterality: N/A;   CORONARY ANGIOPLASTY WITH STENT PLACEMENT     taxus stent  placed into his right coronary artery; stent placed to the LAD.     CORONARY STENT INTERVENTION N/A 10/16/2017   Procedure: CORONARY STENT INTERVENTION;  Surgeon: Verlin Bruckner BIRCH, MD;  Location: [facility] INVASIVE CV LAB;  Service: Cardiovascular;  Laterality: N/A;   FEMORAL-FEMORAL BYPASS GRAFT  07/2009   INCISION AND DRAINAGE OF WOUND Left 08/26/2023   Procedure: IRRIGATION AND DEBRIDEMENT left groin  WOUND, application of antibiotic beads;  Surgeon: Sheree Penne Bruckner, MD;  Location: [facility] OR;  Service: Vascular;  Laterality: Left;  WASHOUT LEFT GROIN   INCISION AND DRAINAGE OF WOUND Left 08/29/2023   Procedure: IRRIGATION AND DEBRIDEMENT WOUND;  Surgeon: Sheree Penne Bruckner, MD;  Location: [facility] OR;  Service: Vascular;  Laterality: Left;  WASHOUT LEFT GROIN REPEAT   LAPAROTOMY N/A 05/26/2022   Procedure: EXPLORATORY LAPAROTOMY;  Surgeon: Paola Dreama SAILOR, MD;  Location: [facility] OR;  Service: General;  Laterality: N/A;   LOWER EXTREMITY INTERVENTION Right 07/10/2023   Procedure: LOWER EXTREMITY INTERVENTION;  Surgeon: Pearline Norman RAMAN, MD;  Location: [facility] INVASIVE CV LAB;  Service: Cardiovascular;  Laterality: Right;    LUMBAR MICRODISCECTOMY  12/31/08   LYSIS OF ADHESION N/A 06/30/2023   Procedure: LAPAROSCOPIC LYSIS OF ADHESIONS;  Surgeon: Sheldon Standing, MD;  Location: [facility] ORS;  Service: General;  Laterality: N/A;   PATCH ANGIOPLASTY Left 08/26/2023   Procedure: Primary repair of left femoral graftotomy;  Surgeon: Sheree Penne Bruckner, MD;  Location: [facility] OR;  Service: Vascular;  Laterality: Left;   PERIPHERAL VASCULAR THROMBECTOMY Right 07/10/2023   Procedure: PERIPHERAL VASCULAR THROMBECTOMY;  Surgeon: Pearline Norman RAMAN, MD;  Location: [facility] INVASIVE CV LAB;  Service: Cardiovascular;  Laterality: Right;   PROCTOSCOPY N/A 06/30/2023   Procedure: Flexible sigmoidoscopy;  Surgeon: Sheldon Standing, MD;  Location: [facility] ORS;  Service: General;  Laterality: N/A;   RIGHT/LEFT HEART CATH AND CORONARY ANGIOGRAPHY N/A 10/16/2017   Procedure: RIGHT/LEFT HEART CATH AND CORONARY ANGIOGRAPHY;  Surgeon: Verlin Bruckner BIRCH, MD;  Location: [facility] INVASIVE CV LAB;  Service: Cardiovascular;  Laterality: N/A;   SHOULDER OPEN ROTATOR CUFF REPAIR Left    VACUUM ASSISTED CLOSURE CHANGE Left 08/29/2023   Procedure: REPLACEMENT, WOUND VAC DRESSING, GROIN;  Surgeon: Sheree Penne Bruckner, MD;  Location: [facility] OR;  Service: Vascular;  Laterality: Left;   XI ROBOTIC ASSISTED COLOSTOMY TAKEDOWN N/A 06/30/2023   Procedure: XI ROBOTIC ASSISTED OSTOMY TAKEDOWN, PARTIAL RECTOSIGMOID RESECTION, BILATERAL TAP BLOCK;  Surgeon: Sheldon Standing, MD;  Location: [facility] ORS;  Service: General;  Laterality: N/A;    Social History:  reports that he quit smoking about 3 years ago. His smoking use included cigars and cigarettes. He started smoking about 59 years ago. He has a 28 pack-year smoking history. He has quit using smokeless tobacco.  His smokeless tobacco use included chew. He reports current alcohol use. He reports that he does not use drugs. Family History:  Family History  Problem Relation Age of Onset   Coronary artery disease Unknown    Diabetes Unknown     Cardiomyopathy Mother    Heart disease Mother    Hyperlipidemia Mother    Coronary artery disease Father    Stroke Father    Diabetes Father    Heart disease Father        before age 71   Hyperlipidemia Father    Heart attack Father    Diabetes Sister    Heart disease Sister        before age 21   Hyperlipidemia Sister    Heart attack Sister      HOME MEDICATIONS: Allergies as of 01/04/2024       Reactions   Lisinopril  Swelling, Rash   Rash - face and tounge swell   Levaquin [levofloxacin]         Medication List        Accurate as of January 04, 2024  9:30 AM. If you have any questions, ask  your nurse or doctor.          acetaminophen  500 MG tablet Commonly known as: TYLENOL  Take 1,000 mg by mouth every 6 (six) hours as needed for moderate pain (pain score 4-6).   amLODipine  5 MG tablet Commonly known as: NORVASC  TAKE 1 TABLET(5 MG) BY MOUTH DAILY   aspirin  EC 81 MG tablet Take 1 tablet (81 mg total) by mouth daily. Swallow whole.   carvedilol  12.5 MG tablet Commonly known as: COREG  Take 1 tablet (12.5 mg total) by mouth 2 (two) times daily with a meal.   cefadroxil  500 MG capsule Commonly known as: DURICEF Take 2 capsules (1,000 mg total) by mouth 2 (two) times daily.   cetirizine  10 MG tablet Commonly known as: ZYRTEC  Take 1 tablet (10 mg total) by mouth daily.   cyclobenzaprine  10 MG tablet Commonly known as: FLEXERIL  Take 1 tablet (10 mg total) by mouth 3 (three) times daily as needed for muscle spasms.   ezetimibe  10 MG tablet Commonly known as: ZETIA  Take 1 tablet (10 mg total) by mouth daily.   furosemide  20 MG tablet Commonly known as: Lasix  Take 1 tablet (20 mg total) by mouth every morning.   insulin  glargine 100 unit/mL Sopn Commonly known as: LANTUS  46 units subcut daily at nighttime What changed:  how much to take how to take this when to take this additional instructions   isosorbide  dinitrate 10 MG tablet Commonly  known as: ISORDIL  Take 1 tablet (10 mg total) by mouth 2 (two) times daily.   Jardiance  10 MG Tabs tablet Generic drug: empagliflozin  Take 1 tablet (10 mg total) by mouth daily.   loperamide  2 MG tablet Commonly known as: IMODIUM  A-D Take 2 mg by mouth 4 (four) times daily as needed for diarrhea or loose stools.   nitroGLYCERIN  0.4 MG SL tablet Commonly known as: NITROSTAT  Place 0.4 mg under the tongue every 5 (five) minutes as needed for chest pain.   NovoLOG  FlexPen 100 UNIT/ML FlexPen Generic drug: insulin  aspart Inject 12 Units into the skin 3 (three) times daily with meals. What changed:  when to take this reasons to take this   oxyCODONE -acetaminophen  5-325 MG tablet Commonly known as: Percocet Take 1 tablet by mouth every 6 (six) hours as needed for severe pain (pain score 7-10).   rivaroxaban  20 MG Tabs tablet Commonly known as: XARELTO  Take 1 tablet (20 mg total) by mouth daily with supper.   rosuvastatin  40 MG tablet Commonly known as: CRESTOR  TAKE 1 TABLET(40 MG) BY MOUTH DAILY   tamsulosin  0.4 MG Caps capsule Commonly known as: FLOMAX  Take 0.8 mg by mouth every evening.   Trulicity  0.75 MG/0.5ML Soaj Generic drug: Dulaglutide  Inject 0.75 mg into the skin once a week.   Trulicity  1.5 MG/0.5ML Soaj Generic drug: Dulaglutide  Inject 1.5 mg into the skin once a week. Start taking on: January 26, 2024   Trulicity  3 MG/0.5ML Soaj Generic drug: Dulaglutide  Inject 3 mg as directed once a week. Start taking on: February 25, 2024         ALLERGIES: Allergies  Allergen Reactions   Lisinopril  Swelling and Rash    Rash - face and tounge swell   Levaquin [Levofloxacin]      REVIEW OF SYSTEMS: A comprehensive ROS was conducted with the patient and is negative except as per HPI    OBJECTIVE:   VITAL SIGNS: BP 120/82 (BP Location: Left Arm, Patient Position: Sitting, Cuff Size: Normal)   Pulse 83   Ht  5' 7 (1.702 m)   Wt 182 lb 6.4 oz (82.7 kg)    SpO2 99%   BMI 28.57 kg/m    PHYSICAL EXAM:  General: Pt appears well and is in NAD  Neck: General: Supple without adenopathy or carotid bruits. Thyroid : Thyroid  size normal.  No goiter or nodules appreciated.   Lungs: Clear with good BS bilat   Heart: RRR   Abdomen:  soft, nontender  Extremities:  Lower extremities - No pretibial edema.   Neuro: MS is good with appropriate affect, pt is alert and Ox3    DM foot exam: 01/04/2024  The skin of the feet without sores or ulcerations with onychomycosis The pedal pulses are undetectable The sensation is intact to a screening 5.07, 10 gram monofilament bilaterally    DATA REVIEWED:  Lab Results  Component Value Date   HGBA1C 10.5 (H) 12/08/2023   HGBA1C 9.1 (H) 09/27/2023   HGBA1C 10.6 (H) 06/30/2023    Latest Reference Range & Units 12/24/23 02:18  Sodium 135 - 145 mmol/L 136  Potassium 3.5 - 5.1 mmol/L 3.8  Chloride 98 - 111 mmol/L 104  CO2 22 - 32 mmol/L 21 (L)  Glucose 70 - 99 mg/dL 797 (H)  BUN 8 - 23 mg/dL 22  Creatinine 9.38 - 8.75 mg/dL 8.29 (H)  Calcium  8.9 - 10.3 mg/dL 8.9  Anion gap 5 - 15  11  Magnesium 1.7 - 2.4 mg/dL 2.0  GFR, Estimated >39 mL/min 41 (L)    ASSESSMENT / PLAN / RECOMMENDATIONS:   1) Type 2 Diabetes Mellitus, poorly controlled, With CKD III and macrovascular complications - Most recent A1c of 10.5%. Goal A1c < 7.0 %.     - Patient with poorly controlled diabetes - He was recently started on Trulicity , no side effects at this time, we discussed switching to Mounjaro  once he is done with the current Trulicity  pens - Patient has not been using NovoLog , last time he used NovoLog  was a week ago, patient has been no postprandial hyperglycemia, I did recommend glipizide , I did caution the patient against hypoglycemia and the importance of eating within 20 minutes of taking glipizide , patient advised to contact the office with hypoglycemia so I can make the appropriate adjustments.  I will stop his  NovoLog  as the create hardship with multiple injections a day - Patient has been noted with hypoglycemia overnight, I will decrease his basal insulin  as below - Patient advised to avoid sugar sweetened beverages, he likes Dr. Nunzio, I have encouraged him to try zero sugar Dr Nunzio   MEDICATIONS: Continue Jardiance  10 mg daily Start glipizide  5 mg twice daily Stop Trulicity0.75 mg Start Mounjaro  5 mg weekly Decrease Lantus  40 units daily Patient is not using NovoLog   EDUCATION / INSTRUCTIONS: BG monitoring instructions: Patient is instructed to check his blood sugars 3 times a day, before meals. Call [facility] if: BG persistently < 70  I reviewed the Rule of 15 for the treatment of hypoglycemia in detail with the patient. Literature supplied.   2) Diabetic complications:  Eye: Does not have known diabetic retinopathy.  Neuro/ Feet: Does not have known diabetic peripheral neuropathy. Renal: Patient does  have known baseline CKD. He is not on an ACEI/ARB at present.   Follow-up in 2 months   Signed electronically by: Stefano Redgie Butts, MD  [facility]  [facility] [address] [place] [place], [place] [address] Phone: [phone] FAX: [phone]   CC: Fleeta Valeria Mayo,  MD [address] [place] [address] Phone: [phone]  Fax: [phone]    Return to Endocrinology clinic as below: Future Appointments  Date Time Provider Department Center  01/10/2024  8:30 AM [facility] - NURSE RPC-RAN None  02/20/2024  8:00 AM Bernie Lamar PARAS, MD CVD-ASHE None  02/21/2024 10:00 AM Luiz Channel, MD [facility]  03/25/2024  8:30 AM Fleeta Valeria Mayo, MD RPC-RAN None  06/12/2024 10:00 AM [facility] 10 H[facility]-UL[facility] H&V  06/12/2024 11:00 AM [facility] 10 [facility] H&V  06/12/2024 11:15 AM [facility] PA-2 [facility] H&V

## 2024-01-04 NOTE — Telephone Encounter (Signed)
 Pt viewed Carotid US  results on My Chart per Dr. Karry note. Routed to PCP.

## 2024-01-04 NOTE — Patient Instructions (Addendum)
 Continue Jardiance  10 mg, 1 tablet every morning Start glipizide  5 mg, 1 tablet before breakfast and 1 tablet before dinner Switch Trulicity  to Mounjaro  5 mg once weekly Decrease Lantus  40 units every night Do not use NovoLog  at this time      HOW TO TREAT LOW BLOOD SUGARS (Blood sugar LESS THAN 70 MG/DL) Please follow the RULE OF 15 for the treatment of hypoglycemia treatment (when your (blood sugars are less than 70 mg/dL)   STEP 1: Take 15 grams of carbohydrates when your blood sugar is low, which includes:  3-4 GLUCOSE TABS  OR 3-4 OZ OF JUICE OR REGULAR SODA OR ONE TUBE OF GLUCOSE GEL    STEP 2: RECHECK blood sugar in 15 MINUTES STEP 3: If your blood sugar is still low at the 15 minute recheck --> then, go back to STEP 1 and treat AGAIN with another 15 grams of carbohydrates.

## 2024-01-10 ENCOUNTER — Ambulatory Visit: Admitting: Internal Medicine

## 2024-01-10 DIAGNOSIS — I5042 Chronic combined systolic (congestive) and diastolic (congestive) heart failure: Secondary | ICD-10-CM | POA: Diagnosis not present

## 2024-01-10 NOTE — Progress Notes (Signed)
 Nurse visit Lab Draw CBC, CMP

## 2024-01-11 ENCOUNTER — Other Ambulatory Visit: Payer: Self-pay | Admitting: Cardiology

## 2024-01-11 LAB — CMP14 + ANION GAP
ALT: 10 IU/L (ref 0–44)
AST: 14 IU/L (ref 0–40)
Albumin: 4.3 g/dL (ref 3.8–4.8)
Alkaline Phosphatase: 94 IU/L (ref 47–123)
Anion Gap: 15 mmol/L (ref 10.0–18.0)
BUN/Creatinine Ratio: 16 (ref 10–24)
BUN: 22 mg/dL (ref 8–27)
Bilirubin Total: 0.3 mg/dL (ref 0.0–1.2)
CO2: 19 mmol/L — ABNORMAL LOW (ref 20–29)
Calcium: 9.6 mg/dL (ref 8.6–10.2)
Chloride: 106 mmol/L (ref 96–106)
Creatinine, Ser: 1.39 mg/dL — ABNORMAL HIGH (ref 0.76–1.27)
Globulin, Total: 2.7 g/dL (ref 1.5–4.5)
Glucose: 173 mg/dL — ABNORMAL HIGH (ref 70–99)
Potassium: 4.8 mmol/L (ref 3.5–5.2)
Sodium: 140 mmol/L (ref 134–144)
Total Protein: 7 g/dL (ref 6.0–8.5)
eGFR: 52 mL/min/1.73 — ABNORMAL LOW (ref >59)

## 2024-01-11 LAB — CBC WITH DIFFERENTIAL/PLATELET
Basophils Absolute: 0 x10E3/uL (ref 0.0–0.2)
Basos: 1 %
EOS (ABSOLUTE): 0.1 x10E3/uL (ref 0.0–0.4)
Eos: 2 %
Hematocrit: 35.9 % — ABNORMAL LOW (ref 37.5–51.0)
Hemoglobin: 10.2 g/dL — ABNORMAL LOW (ref 13.0–17.7)
Immature Grans (Abs): 0 x10E3/uL (ref 0.0–0.1)
Immature Granulocytes: 0 %
Lymphocytes Absolute: 1.2 x10E3/uL (ref 0.7–3.1)
Lymphs: 19 %
MCH: 22 pg — ABNORMAL LOW (ref 26.6–33.0)
MCHC: 28.4 g/dL — ABNORMAL LOW (ref 31.5–35.7)
MCV: 78 fL — ABNORMAL LOW (ref 79–97)
Monocytes Absolute: 0.7 x10E3/uL (ref 0.1–0.9)
Monocytes: 11 %
Neutrophils Absolute: 4.4 x10E3/uL (ref 1.4–7.0)
Neutrophils: 67 %
Platelets: 313 x10E3/uL (ref 150–450)
RBC: 4.63 x10E6/uL (ref 4.14–5.80)
RDW: 17.6 % — ABNORMAL HIGH (ref 11.6–15.4)
WBC: 6.5 x10E3/uL (ref 3.4–10.8)

## 2024-01-17 DIAGNOSIS — D649 Anemia, unspecified: Secondary | ICD-10-CM | POA: Diagnosis not present

## 2024-01-17 DIAGNOSIS — K625 Hemorrhage of anus and rectum: Secondary | ICD-10-CM | POA: Diagnosis not present

## 2024-01-17 DIAGNOSIS — K219 Gastro-esophageal reflux disease without esophagitis: Secondary | ICD-10-CM | POA: Diagnosis not present

## 2024-01-24 DIAGNOSIS — D509 Iron deficiency anemia, unspecified: Secondary | ICD-10-CM | POA: Diagnosis not present

## 2024-01-29 ENCOUNTER — Ambulatory Visit: Payer: Self-pay

## 2024-01-29 NOTE — Progress Notes (Signed)
 Patient called.  Patient aware.

## 2024-01-31 DIAGNOSIS — D509 Iron deficiency anemia, unspecified: Secondary | ICD-10-CM | POA: Diagnosis not present

## 2024-02-02 ENCOUNTER — Telehealth: Payer: Self-pay

## 2024-02-02 NOTE — Telephone Encounter (Signed)
 1st attempt to reach pt regarding surgical clearance and the need for an TELE appointment.  Left pt a detailed message to call back and get that scheduled.

## 2024-02-02 NOTE — Telephone Encounter (Signed)
   Pre-operative Risk Assessment    Patient Name: [name]  DOB: 02/14/1946 MRN: [id number]   Date of last office visit: 11/20/2023 Date of next office visit: 02/20/2024   Request for Surgical Clearance    Procedure:  Colonoscopy and EGD  Date of Surgery:  Clearance TBD                                Surgeon:  Dr. Evalene Fanti Surgeon's Group or Practice Name:  [facility] Phone number:  [phone] Fax number:  [phone]   Type of Clearance Requested:   - Pharmacy:  Hold Clopidogrel  (Plavix ) Please advise Aspirin  Please advise   Type of Anesthesia:  Not Indicated   Additional requests/questions:    Bonney Calvert Pouch   02/02/2024, 11:36 AM

## 2024-02-02 NOTE — Telephone Encounter (Signed)
   Name: [name]  DOB: 04-09-1946  MRN: [id number]  Primary Cardiologist: Lamar Fitch, MD   Preoperative team, please contact this patient and set up a phone call appointment for further preoperative risk assessment. Please obtain consent and complete medication review. Thank you for your help.  I confirm that guidance regarding antiplatelet and oral anticoagulation therapy has been completed and, if necessary, noted below.   Per office protocol, if patient is without any new symptoms or concerns at the time of their virtual visit, he may hold Plavix  for 5 days prior to procedure. Please resume Plavix  as soon as possible postprocedure, at the discretion of the surgeon.    I also confirmed the patient resides in the state of [place] . As per [facility] Medical Board telemedicine laws, the patient must reside in the state in which the provider is licensed.   Lamarr Satterfield, NP 02/02/2024, 11:58 AM [facility]

## 2024-02-05 ENCOUNTER — Telehealth (HOSPITAL_BASED_OUTPATIENT_CLINIC_OR_DEPARTMENT_OTHER): Payer: Self-pay | Admitting: *Deleted

## 2024-02-05 ENCOUNTER — Other Ambulatory Visit: Payer: Self-pay | Admitting: Internal Medicine

## 2024-02-05 NOTE — Telephone Encounter (Signed)
 2nd attempt to reach the pt. See previous notes. Pt has upcoming appt with Dr. Bernie 02/20/24. We can set up a tele appt for preop as well if pt chooses.

## 2024-02-05 NOTE — Telephone Encounter (Signed)
 SEE NOTES PT IN ON XARELTO  AND ASA NOT PLAVIX  .

## 2024-02-05 NOTE — Telephone Encounter (Signed)
 CORRECTION ON MEDS TO BE HELD. PT IS NOT ON PLAVIX . PT IS ON XARELTO  AND ASA.   I will forward back to preop APP and pharm-d for recommendations on Xarelto .   Pt and his wife said procedure is set for 02/20/24. I asked about the appt with Dr. Krasowski 02/20/24 as well.   Pt and his wife opted to change Dr. Bernie appt to 04/12/24 2 pm.   Set up tele for preop clearance.   Med rec and consent are done.

## 2024-02-05 NOTE — Telephone Encounter (Signed)
 CORRECTION ON MEDS TO BE HELD. PT IS NOT ON PLAVIX . PT IS ON XARELTO  AND ASA.   I will forward back to preop APP and pharm-d for recommendations on Xarelto .   Pt and his wife said procedure is set for 02/20/24. I asked about the appt with Dr. Krasowski 02/20/24 as well.   Pt and his wife opted to change Dr. Bernie appt to 04/12/24 2 pm.   Set up tele for preop clearance.   Med rec and consent are done.      Patient Consent for Virtual Visit        Matthe E Lundberg has provided verbal consent on 02/05/2024 for a virtual visit (video or telephone).   CONSENT FOR VIRTUAL VISIT FOR:  [name]  By participating in this virtual visit I agree to the following:  I hereby voluntarily request, consent and authorize [facility] and its employed or contracted physicians, physician assistants, nurse practitioners or other licensed health care professionals (the Practitioner), to provide me with telemedicine health care services (the "Services) as deemed necessary by the treating Practitioner. I acknowledge and consent to receive the Services by the Practitioner via telemedicine. I understand that the telemedicine visit will involve communicating with the Practitioner through live audiovisual communication technology and the disclosure of certain medical information by electronic transmission. I acknowledge that I have been given the opportunity to request an in-person assessment or other available alternative prior to the telemedicine visit and am voluntarily participating in the telemedicine visit.  I understand that I have the right to withhold or withdraw my consent to the use of telemedicine in the course of my care at any time, without affecting my right to future care or treatment, and that the Practitioner or I may terminate the telemedicine visit at any time. I understand that I have the right to inspect all information obtained and/or recorded in the course of the telemedicine  visit and may receive copies of available information for a reasonable fee.  I understand that some of the potential risks of receiving the Services via telemedicine include:  Delay or interruption in medical evaluation due to technological equipment failure or disruption; Information transmitted may not be sufficient (e.g. poor resolution of images) to allow for appropriate medical decision making by the Practitioner; and/or  In rare instances, security protocols could fail, causing a breach of personal health information.  Furthermore, I acknowledge that it is my responsibility to provide information about my medical history, conditions and care that is complete and accurate to the best of my ability. I acknowledge that Practitioner's advice, recommendations, and/or decision may be based on factors not within their control, such as incomplete or inaccurate data provided by me or distortions of diagnostic images or specimens that may result from electronic transmissions. I understand that the practice of medicine is not an exact science and that Practitioner makes no warranties or guarantees regarding treatment outcomes. I acknowledge that a copy of this consent can be made available to me via my patient portal [facility] MyChart), or I can request a printed copy by calling the office of  [facility].    I understand that my insurance will be billed for this visit.   I have read or had this consent read to me. I understand the contents of this consent, which adequately explains the benefits and risks of the Services being provided via telemedicine.  I have been provided ample opportunity to ask questions regarding this consent and  the Services and have had my questions answered to my satisfaction. I give my informed consent for the services to be provided through the use of telemedicine in my medical care

## 2024-02-09 NOTE — Telephone Encounter (Signed)
   Patient Name: [name]  DOB: 1945/11/21 MRN: [id number]  Primary Cardiologist: Lamar Fitch, MD  Clinical pharmacists have reviewed the patient's past medical history, labs, and current medications as part of preoperative protocol coverage. The following recommendations have been made:  Patient with diagnosis of atrial fibrillation on Xarelto  for anticoagulation.     Procedure: colonoscopy and EGD Date of procedure: 02/20/2024     CHA2DS2-VASc Score = 6   This indicates a 9.7% annual risk of stroke. The patient's score is based upon: CHF History: 1 HTN History: 1 Diabetes History: 1 Stroke History: 0 Vascular Disease History: 1 Age Score: 2 Gender Score: 0   CrCl 44 ml/min Platelet count 297 K   Patient has not had an Afib/aflutter ablation within the last 3 months or DCCV within the last 30 days   Per office protocol, patient can hold Xarelto  for 2 days prior to procedure.     Patient will not need bridging with Lovenox  (enoxaparin ) around procedure.   I will route this recommendation to the requesting party via Epic fax function and remove from pre-op pool.  Please call with questions.  Lamarr Satterfield, NP 02/09/2024, 4:03 PM

## 2024-02-09 NOTE — Telephone Encounter (Signed)
 Patient with diagnosis of atrial fibrillation on Xarelto  for anticoagulation.    Procedure: colonoscopy and EGD Date of procedure: 02/20/2024   CHA2DS2-VASc Score = 6   This indicates a 9.7% annual risk of stroke. The patient's score is based upon: CHF History: 1 HTN History: 1 Diabetes History: 1 Stroke History: 0 Vascular Disease History: 1 Age Score: 2 Gender Score: 0   CrCl 44 ml/min Platelet count 297 K  Patient has not had an Afib/aflutter ablation within the last 3 months or DCCV within the last 30 days  Per office protocol, patient can hold Xarelto  for 2 days prior to procedure.    Patient will not need bridging with Lovenox  (enoxaparin ) around procedure.  **This guidance is not considered finalized until pre-operative APP has relayed final recommendations.**

## 2024-02-12 NOTE — Telephone Encounter (Signed)
 Will send this to note to preop APP to review for ASA hold.

## 2024-02-12 NOTE — Telephone Encounter (Signed)
 I d/w preop APP Lum FALCON. NP who stated the pt has a tele preop appt 02/14/24 which ASA will be reviewed then.

## 2024-02-12 NOTE — Telephone Encounter (Signed)
 Requesting office called in stating they received clearance for xarelto  but they need clearance for aspirin  too.

## 2024-02-14 ENCOUNTER — Telehealth: Payer: Self-pay | Admitting: Emergency Medicine

## 2024-02-14 ENCOUNTER — Ambulatory Visit

## 2024-02-14 NOTE — Telephone Encounter (Signed)
 Attempted to return patient's call regarding missed tele appt today. Call is directed straight to voicemail. See previous encounter regarding prior attempts made by preop APP

## 2024-02-14 NOTE — Telephone Encounter (Signed)
 1st attempt to reach pt regarding surgical clearance and the need to reschedule missed tele appt. [id number]

## 2024-02-14 NOTE — Telephone Encounter (Signed)
 Attempted to reach patient for scheduled televisit today.  Called x4 with voicemail left and no response.  Preop: Please attempt to reach patient and reschedule patient's televisit appointment.

## 2024-02-14 NOTE — Telephone Encounter (Signed)
 Pt states he did not receive a call at 11 for his telephone visit. Please advise.

## 2024-02-14 NOTE — Progress Notes (Deleted)
 Virtual Visit via Telephone Note   Because of [name] co-morbid illnesses, he is at least at moderate risk for complications without adequate follow up.  This format is felt to be most appropriate for this patient at this time.  Due to technical limitations with video connection (technology), today's appointment will be conducted as an audio only telehealth visit, and [name] verbally agreed to proceed in this manner.   All issues noted in this document were discussed and addressed.  No physical exam could be performed with this format.  Evaluation Performed:  Preoperative cardiovascular risk assessment _____________   Date:  02/14/2024   Patient ID:  [name], DOB 1946-01-11, MRN [id number] Patient Location:  Home Provider location:   Office  Primary Care Provider:  Fleeta Valeria Mayo, MD Primary Cardiologist:  Lamar Fitch, MD  Chief Complaint / Patient Profile   78 y.o. y/o male with a h/o paroxysmal atrial fibrillation, COPD, ischemic cardiomyopathy, hypertension, hyperlipidemia, coronary artery disease s/p multiple interventions, PVCs, T2DM, PVD, PAD who is pending colonoscopy and EGD on date TBD with [facility] digestive disease and presents today for telephonic preoperative cardiovascular risk assessment.  History of Present Illness    [name] is a 78 y.o. male who presents via audio/video conferencing for a telehealth visit today.  Pt was last seen in cardiology clinic on 11/20/2023 by Dr. Fitch.  At that time [name] was doing well.  The patient is now pending procedure as outlined above. Since his last visit, he ***  Past Medical History    Past Medical History:  Diagnosis Date   Acute low back pain 01/12/2015   Acute lower limb ischemia 07/11/2023   Acute pain of both shoulders 07/22/2022   AKI (acute kidney injury) 05/24/2022   Arthritis    Atherosclerosis of native arteries of the extremities with intermittent claudication 08/13/2013    Atherosclerosis of native artery of extremity with intermittent claudication 08/13/2013   IMO SNOMED Dx Update Oct 2024     Backache 02/08/2013   Benign prostatic hyperplasia with urinary frequency 02/23/2023   Bladder cancer (HCC)    resection x3   BMI 27.0-27.9,adult 02/23/2023   Cellulitis 07/17/2023   Chronic combined systolic (congestive) and diastolic (congestive) heart failure (HCC) 07/22/2022   Chronic left shoulder pain 05/24/2023   Chronic right shoulder pain 09/02/2022   Chronic systolic CHF (congestive heart failure) (HCC)    CKD stage 3b, GFR 30-44 ml/min (HCC) 07/03/2023   Coronary artery disease    status post DMI RX Taxus stent RCA 2006 with susequent Stent LAD and subsequent  stent thrombosis RCA unable to be opened 2006 -neg mv 10/2008, 10/16/17 ISR to pLAD with PTCA/DES, CTO of RCA with collaterals, EF 25%   Coronary artery disease involving native coronary artery of native heart with unstable angina pectoris (HCC)    Current moderate episode of major depressive disorder without prior episode (HCC) 03/28/2022   Diverticulitis 06/10/2022   Diverticulitis large intestine 05/11/2021   Diverticulitis of large intestine with abscess 05/24/2022   Diverticulitis of sigmoid colon 05/11/2021   DM2 (diabetes mellitus, type 2) (HCC) 02/09/2022   status post bilateral aortobifemoral bypass [id number] with recent fem to fembypass April  2011 per DR. Early     Dyssynergic defecation 03/21/2023   Essential hypertension 07/22/2022   Fatigue 03/13/2023   History of bladder cancer 02/23/2023   History of colonic diverticulitis 03/21/2023   Hypercholesterolemia 10/28/2022   HYPERLIPIDEMIA-MIXED 09/22/2008  Qualifier: Diagnosis of  By: Justina Kos     Hypothyroidism 05/24/2022   Ischemic cardiomyopathy    ejection fraction of 40-45%   Lethargy 03/28/2022   Lumbar spinal stenosis    Myocardial infarction (HCC)    I've had 4 (10/16/2017)   Need for prophylactic vaccination and  inoculation against influenza 03/28/2022   NSVT (nonsustained ventricular tachycardia) (HCC)    PAD (peripheral artery disease) 07/17/2023   PAF (paroxysmal atrial fibrillation) (HCC) 02/09/2022   Paroxysmal atrial fibrillation (HCC)    Peripheral arterial disease    Peripheral artery disease 07/09/2023   Poorly controlled T2 diabetes mellitus (HCC) 09/27/2022   Protein-calorie malnutrition, severe 05/29/2022   PVC's (premature ventricular contractions)    PVD 09/22/2008   Qualifier: Diagnosis of  By: Justina Kos     PVD (peripheral vascular disease) 02/23/2023   Rotator cuff arthropathy of right shoulder 09/27/2022   Sigmoid diverticulitis 06/30/2023   Status post coronary artery stent placement    Status post Hartmann's procedure (HCC) 03/21/2023   TOBACCO ABUSE 05/29/2009   Qualifier: Diagnosis of  By: Velinda, RN, BSN, Avelina CROME    Trigger ring finger of right hand 03/29/2023   Type 2 diabetes mellitus with other circulatory complications (HCC) 07/22/2022   Type II diabetes mellitus (HCC)    Unstable angina (HCC) 10/16/2017   Past Surgical History:  Procedure Laterality Date   ABDOMINAL AORTOGRAM W/LOWER EXTREMITY Right 07/10/2023   Procedure: ABDOMINAL AORTOGRAM W/LOWER EXTREMITY;  Surgeon: Pearline Norman RAMAN, MD;  Location: [facility] INVASIVE CV LAB;  Service: Cardiovascular;  Laterality: Right;   AORTA - BILATERAL FEMORAL ARTERY BYPASS GRAFT Bilateral 1992   APPENDECTOMY     APPLICATION OF WOUND VAC Left 08/26/2023   Procedure: APPLICATION, WOUND VAC left groin;  Surgeon: Sheree Penne Bruckner, MD;  Location[facility] OR;  Service: Vascular;  Laterality: Left;   APPLICATION, SKIN SUBSTITUTE Left 08/29/2023   Procedure: APPLICATION, SKIN SUBSTITUTE;  Surgeon: Sheree Penne Bruckner, MD;  Location: [facility] OR;  Service: Vascular;  Laterality: Left;   BACK SURGERY     COLECTOMY WITH COLOSTOMY CREATION/HARTMANN PROCEDURE N/A 05/26/2022   Procedure: COLECTOMY WITH COLOSTOMY CREATION/HARTMANN  PROCEDURE;  Surgeon: Paola Dreama SAILOR, MD;  Location: [facility] OR;  Service: General;  Laterality: N/A;   CORONARY ANGIOPLASTY WITH STENT PLACEMENT     taxus stent  placed into his right coronary artery; stent placed to the LAD.     CORONARY STENT INTERVENTION N/A 10/16/2017   Procedure: CORONARY STENT INTERVENTION;  Surgeon: Verlin Bruckner BIRCH, MD;  Location: [facility] INVASIVE CV LAB;  Service: Cardiovascular;  Laterality: N/A;   FEMORAL-FEMORAL BYPASS GRAFT  07/2009   INCISION AND DRAINAGE OF WOUND Left 08/26/2023   Procedure: IRRIGATION AND DEBRIDEMENT left groin  WOUND, application of antibiotic beads;  Surgeon: Sheree Penne Bruckner, MD;  Location: [facility] OR;  Service: Vascular;  Laterality: Left;  WASHOUT LEFT GROIN   INCISION AND DRAINAGE OF WOUND Left 08/29/2023   Procedure: IRRIGATION AND DEBRIDEMENT WOUND;  Surgeon: Sheree Penne Bruckner, MD;  Location: [facility] OR;  Service: Vascular;  Laterality: Left;  WASHOUT LEFT GROIN REPEAT   LAPAROTOMY N/A 05/26/2022   Procedure: EXPLORATORY LAPAROTOMY;  Surgeon: Paola Dreama SAILOR, MD;  Location: [facility] OR;  Service: General;  Laterality: N/A;   LOWER EXTREMITY INTERVENTION Right 07/10/2023   Procedure: LOWER EXTREMITY INTERVENTION;  Surgeon: Pearline Norman RAMAN, MD;  Location: [facility] INVASIVE CV LAB;  Service: Cardiovascular;  Laterality: Right;   LUMBAR MICRODISCECTOMY  12/31/08   LYSIS OF ADHESION  N/A 06/30/2023   Procedure: LAPAROSCOPIC LYSIS OF ADHESIONS;  Surgeon: Sheldon Standing, MD;  Location: [facility] ORS;  Service: General;  Laterality: N/A;   PATCH ANGIOPLASTY Left 08/26/2023   Procedure: Primary repair of left femoral graftotomy;  Surgeon: Sheree Penne Bruckner, MD;  Location[facility] OR;  Service: Vascular;  Laterality: Left;   PERIPHERAL VASCULAR THROMBECTOMY Right 07/10/2023   Procedure: PERIPHERAL VASCULAR THROMBECTOMY;  Surgeon: Pearline Norman RAMAN, MD;  Location: [facility] INVASIVE CV LAB;  Service: Cardiovascular;  Laterality: Right;   PROCTOSCOPY N/A 06/30/2023   Procedure:  Flexible sigmoidoscopy;  Surgeon: Sheldon Standing, MD;  Location: [facility] ORS;  Service: General;  Laterality: N/A;   RIGHT/LEFT HEART CATH AND CORONARY ANGIOGRAPHY N/A 10/16/2017   Procedure: RIGHT/LEFT HEART CATH AND CORONARY ANGIOGRAPHY;  Surgeon: Verlin Bruckner BIRCH, MD;  Location: [facility] INVASIVE CV LAB;  Service: Cardiovascular;  Laterality: N/A;   SHOULDER OPEN ROTATOR CUFF REPAIR Left    VACUUM ASSISTED CLOSURE CHANGE Left 08/29/2023   Procedure: REPLACEMENT, WOUND VAC DRESSING, GROIN;  Surgeon: Sheree Penne Bruckner, MD;  Location: [facility] OR;  Service: Vascular;  Laterality: Left;   XI ROBOTIC ASSISTED COLOSTOMY TAKEDOWN N/A 06/30/2023   Procedure: XI ROBOTIC ASSISTED OSTOMY TAKEDOWN, PARTIAL RECTOSIGMOID RESECTION, BILATERAL TAP BLOCK;  Surgeon: Sheldon Standing, MD;  Location: [facility] ORS;  Service: General;  Laterality: N/A;    Allergies  Allergies  Allergen Reactions   Lisinopril  Swelling and Rash    Rash - face and tounge swell   Levaquin [Levofloxacin]     Home Medications    Prior to Admission medications   Medication Sig Start Date End Date Taking? Authorizing Provider  acetaminophen  (TYLENOL ) 500 MG tablet Take 1,000 mg by mouth every 6 (six) hours as needed for moderate pain (pain score 4-6).    [provider]  amLODipine  (NORVASC ) 5 MG tablet TAKE 1 TABLET(5 MG) BY MOUTH DAILY 11/13/23   Krasowski, Robert J, MD  aspirin  EC 81 MG tablet Take 1 tablet (81 mg total) by mouth daily. Swallow whole. 05/10/21   Verlin Bruckner BIRCH, MD  carvedilol  (COREG ) 12.5 MG tablet Take 1 tablet (12.5 mg total) by mouth 2 (two) times daily with a meal. 01/24/23   Bernie Lamar PARAS, MD  cefadroxil  (DURICEF) 500 MG capsule Take 2 capsules (1,000 mg total) by mouth 2 (two) times daily. 10/05/23   Dennise Kingsley, MD  cetirizine  (ZYRTEC ) 10 MG tablet Take 1 tablet (10 mg total) by mouth daily. 02/09/23   Fleeta Valeria Mayo, MD  cyclobenzaprine  (FLEXERIL ) 10 MG tablet Take 1 tablet (10 mg total) by mouth  3 (three) times daily as needed for muscle spasms. 05/24/23   Fleeta Valeria Mayo, MD  empagliflozin  (JARDIANCE ) 10 MG TABS tablet Take 1 tablet (10 mg total) by mouth daily. 01/04/24   Shamleffer, Ibtehal Jaralla, MD  ezetimibe  (ZETIA ) 10 MG tablet Take 1 tablet (10 mg total) by mouth daily. 03/01/23 02/24/24  Krasowski, Robert J, MD  furosemide  (LASIX ) 20 MG tablet Take 1 tablet (20 mg total) by mouth every morning. 12/27/23 12/26/24  Fleeta Valeria Mayo, MD  glipiZIDE  (GLUCOTROL ) 5 MG tablet Take 1 tablet (5 mg total) by mouth 2 (two) times daily before a meal. 01/04/24   Shamleffer, Donell Cardinal, MD  insulin  glargine (LANTUS ) 100 unit/mL SOPN Inject 40 Units into the skin daily. 46 units subcut daily at nighttime 01/04/24   Shamleffer, Ibtehal Jaralla, MD  Insulin  Pen Needle 32G X 4 MM MISC 1 Device by Does not apply route daily in the  afternoon. 01/04/24   Shamleffer, Ibtehal Jaralla, MD  isosorbide  dinitrate (ISORDIL ) 10 MG tablet Take 1 tablet (10 mg total) by mouth 2 (two) times daily. 09/15/23   Carlin Delon BROCKS, NP  loperamide  (IMODIUM  A-D) 2 MG tablet Take 2 mg by mouth 4 (four) times daily as needed for diarrhea or loose stools.    [provider]  nitroGLYCERIN  (NITROSTAT ) 0.4 MG SL tablet Place 0.4 mg under the tongue every 5 (five) minutes as needed for chest pain.    [provider]  oxyCODONE -acetaminophen  (PERCOCET) 5-325 MG tablet Take 1 tablet by mouth every 6 (six) hours as needed for severe pain (pain score 7-10). 09/01/23 08/31/24  Baglia, Corrina, PA-C  rivaroxaban  (XARELTO ) 20 MG TABS tablet Take 1 tablet (20 mg total) by mouth daily with supper. 12/23/22   Fleeta Valeria Mayo, MD  rosuvastatin  (CRESTOR ) 40 MG tablet TAKE 1 TABLET(40 MG) BY MOUTH DAILY 02/05/24   Fleeta Valeria, Mayo, MD  tamsulosin  (FLOMAX ) 0.4 MG CAPS capsule Take 0.8 mg by mouth every evening. 03/31/23   [provider]  tirzepatide  (MOUNJARO ) 5 MG/0.5ML Pen Inject 5 mg into the skin once a week. 01/04/24    Shamleffer, Donell Cardinal, MD    Physical Exam    Vital Signs:  [name] does not have vital signs available for review today.***  Given telephonic nature of communication, physical exam is limited. AAOx3. NAD. Normal affect.  Speech and respirations are unlabored.  Accessory Clinical Findings    None  Assessment & Plan    1.  Preoperative Cardiovascular Risk Assessment:  The patient was advised that if he develops new symptoms prior to surgery to contact our office to arrange for a follow-up visit, and he verbalized understanding.  Per office protocol, patient can hold Xarelto  for 2 days prior to procedure.     Patient will not need bridging with Lovenox  (enoxaparin ) around procedure.  Regarding ASA therapy, we recommend continuation of ASA throughout the perioperative period.    A copy of this note will be routed to requesting surgeon.  Time:   Today, I have spent *** minutes with the patient with telehealth technology discussing medical history, symptoms, and management plan.     Dennis LITTIE Louis, NP  02/14/2024, 10:58 AM

## 2024-02-15 NOTE — Telephone Encounter (Signed)
 Patient called back and has been rescheduled for tomorrow patient was made aware to be attentive to his phone when it comes to his televisit appointment time we have tried multiple times getting a hold of patient

## 2024-02-15 NOTE — Telephone Encounter (Signed)
 2nd attempt contacting patient to reschedule televisit patient missed yesterday will try again at a later time

## 2024-02-15 NOTE — Telephone Encounter (Signed)
 Patient has been rescheduled for televisit

## 2024-02-15 NOTE — Telephone Encounter (Signed)
 Calling back to get patient schd. Please advise

## 2024-02-15 NOTE — Telephone Encounter (Signed)
 Tried contacting patient to reschedule televisit per patient's friend stated procedure is actually scheduled for 11/4 patient needs televisit before the 4th called patient no answer left a detailed vm to call back per preop APP Rosaline Bane patient can be reschedule for tomorrow at 2:40 or 3:40 and please make patient aware he also needs to be prepared to answer because there are multiple attempts about reaching out to him and no answer per Rosaline

## 2024-02-15 NOTE — Telephone Encounter (Signed)
 Called back to say his colonoscopy is schd for 11/4. Please advise

## 2024-02-15 NOTE — Telephone Encounter (Signed)
 [facility]   02/15/24  1:34 PM Note Pts friend Reba calling in regard to his phone appt. It is scheduled 11/6 and his colonoscopy is scheduled 11/4. She would like a callback to discuss another reschedule. Please advise.

## 2024-02-15 NOTE — Telephone Encounter (Signed)
 Pts friend Reba calling in regard to his phone appt. It is scheduled 11/6 and his colonoscopy is scheduled 11/4. She would like a callback to discuss another reschedule. Please advise.

## 2024-02-15 NOTE — Telephone Encounter (Signed)
 Tried contacting patient again no answer procedure is scheduled for 11/4 requesting office called to let us  know tried contacting patient no answer

## 2024-02-16 ENCOUNTER — Ambulatory Visit: Attending: Cardiovascular Disease | Admitting: Emergency Medicine

## 2024-02-16 DIAGNOSIS — Z01810 Encounter for preprocedural cardiovascular examination: Secondary | ICD-10-CM | POA: Diagnosis not present

## 2024-02-16 NOTE — Progress Notes (Signed)
 Virtual Visit via Telephone Note   Because of [name] co-morbid illnesses, he is at least at moderate risk for complications without adequate follow up.  This format is felt to be most appropriate for this patient at this time.  Due to technical limitations with video connection (technology), today's appointment will be conducted as an audio only telehealth visit, and [name] verbally agreed to proceed in this manner.   All issues noted in this document were discussed and addressed.  No physical exam could be performed with this format.  Evaluation Performed:  Preoperative cardiovascular risk assessment _____________   Date:  02/16/2024   Patient ID:  [name], DOB [birth year], MRN [id number] Patient Location:  Home Provider location:   Office  Primary Care Provider:  Fleeta Valeria Mayo, MD Primary Cardiologist:  Lamar Fitch, MD  Chief Complaint / Patient Profile   78 y.o. y/o male with a h/o recent hospitalization for symptomatic anemia requiring transfusion, paroxysmal atrial fibrillation, COPD, history of ischemic cardiomyopathy with multivessel CAD, essential hypertension, limb ischemia trx w/ mechanical thrombectomy (06/2023), graft infection of left groin (08/2023) requiring washout, hyperlipidemia who is pending colonoscopy and EGD and presents today for telephonic preoperative cardiovascular risk assessment.  History of Present Illness    [name] is a 78 y.o. male who presents via audio/video conferencing for a telehealth visit today.  Pt was last seen in cardiology clinic on 11/20/2023 by Dr. Lamar Fitch.  At that time [name] was stable.  The patient is now pending procedure as outlined above. Since his last visit, he was admitted to the hospital on 12/22/2023 with symptomatic anemia (hgb 7.9, c/o CP and SOB) requiring transfusion. Most recent CBC, hgb improved to 10.2 on 01/10/2024. Reports he is continuing to have BRBFR, reports DOE. Denies CP,  palpitations, dizziness, lightheadedness, syncope, hematuria, weight loss/gain, edema.   Past Medical History    Past Medical History:  Diagnosis Date   Acute low back pain 01/12/2015   Acute lower limb ischemia 07/11/2023   Acute pain of both shoulders 07/22/2022   AKI (acute kidney injury) 05/24/2022   Arthritis    Atherosclerosis of native arteries of the extremities with intermittent claudication 08/13/2013   Atherosclerosis of native artery of extremity with intermittent claudication 08/13/2013   IMO SNOMED Dx Update Oct 2024     Backache 02/08/2013   Benign prostatic hyperplasia with urinary frequency 02/23/2023   Bladder cancer (HCC)    resection x3   BMI 27.0-27.9,adult 02/23/2023   Cellulitis 07/17/2023   Chronic combined systolic (congestive) and diastolic (congestive) heart failure (HCC) 07/22/2022   Chronic left shoulder pain 05/24/2023   Chronic right shoulder pain 09/02/2022   Chronic systolic CHF (congestive heart failure) (HCC)    CKD stage 3b, GFR 30-44 ml/min (HCC) 07/03/2023   Coronary artery disease    status post DMI RX Taxus stent RCA 2006 with susequent Stent LAD and subsequent  stent thrombosis RCA unable to be opened 2006 -neg mv 10/2008, 10/16/17 ISR to pLAD with PTCA/DES, CTO of RCA with collaterals, EF 25%   Coronary artery disease involving native coronary artery of native heart with unstable angina pectoris (HCC)    Current moderate episode of major depressive disorder without prior episode (HCC) 03/28/2022   Diverticulitis 06/10/2022   Diverticulitis large intestine 05/11/2021   Diverticulitis of large intestine with abscess 05/24/2022   Diverticulitis of sigmoid colon 05/11/2021   DM2 (diabetes mellitus, type 2) (HCC) 02/09/2022  status post bilateral aortobifemoral bypass in1992 with recent fem to fembypass April  2011 per DR. Early     Dyssynergic defecation 03/21/2023   Essential hypertension 07/22/2022   Fatigue 03/13/2023   History of bladder  cancer 02/23/2023   History of colonic diverticulitis 03/21/2023   Hypercholesterolemia 10/28/2022   HYPERLIPIDEMIA-MIXED 09/22/2008   Qualifier: Diagnosis of  By: Justina Kos     Hypothyroidism 05/24/2022   Ischemic cardiomyopathy    ejection fraction of 40-45%   Lethargy 03/28/2022   Lumbar spinal stenosis    Myocardial infarction (HCC)    I've had 4 (10/16/2017)   Need for prophylactic vaccination and inoculation against influenza 03/28/2022   NSVT (nonsustained ventricular tachycardia) (HCC)    PAD (peripheral artery disease) 07/17/2023   PAF (paroxysmal atrial fibrillation) (HCC) 02/09/2022   Paroxysmal atrial fibrillation (HCC)    Peripheral arterial disease    Peripheral artery disease 07/09/2023   Poorly controlled T2 diabetes mellitus (HCC) 09/27/2022   Protein-calorie malnutrition, severe 05/29/2022   PVC's (premature ventricular contractions)    PVD 09/22/2008   Qualifier: Diagnosis of  By: Justina Kos     PVD (peripheral vascular disease) 02/23/2023   Rotator cuff arthropathy of right shoulder 09/27/2022   Sigmoid diverticulitis 06/30/2023   Status post coronary artery stent placement    Status post Hartmann's procedure (HCC) 03/21/2023   TOBACCO ABUSE 05/29/2009   Qualifier: Diagnosis of  By: Velinda, RN, BSN, Avelina CROME    Trigger ring finger of right hand 03/29/2023   Type 2 diabetes mellitus with other circulatory complications (HCC) 07/22/2022   Type II diabetes mellitus (HCC)    Unstable angina (HCC) 10/16/2017   Past Surgical History:  Procedure Laterality Date   ABDOMINAL AORTOGRAM W/LOWER EXTREMITY Right 07/10/2023   Procedure: ABDOMINAL AORTOGRAM W/LOWER EXTREMITY;  Surgeon: Pearline Norman RAMAN, MD;  Location: [facility] INVASIVE CV LAB;  Service: Cardiovascular;  Laterality: Right;   AORTA - BILATERAL FEMORAL ARTERY BYPASS GRAFT Bilateral 1992   APPENDECTOMY     APPLICATION OF WOUND VAC Left 08/26/2023   Procedure: APPLICATION, WOUND VAC left groin;   Surgeon: Sheree Penne Bruckner, MD;  Location: [facility] OR;  Service: Vascular;  Laterality: Left;   APPLICATION, SKIN SUBSTITUTE Left 08/29/2023   Procedure: APPLICATION, SKIN SUBSTITUTE;  Surgeon: Sheree Penne Bruckner, MD;  Location: [facility] OR;  Service: Vascular;  Laterality: Left;   BACK SURGERY     COLECTOMY WITH COLOSTOMY CREATION/HARTMANN PROCEDURE N/A 05/26/2022   Procedure: COLECTOMY WITH COLOSTOMY CREATION/HARTMANN PROCEDURE;  Surgeon: Paola Dreama SAILOR, MD;  Location: [facility] OR;  Service: General;  Laterality: N/A;   CORONARY ANGIOPLASTY WITH STENT PLACEMENT     taxus stent  placed into his right coronary artery; stent placed to the LAD.     CORONARY STENT INTERVENTION N/A 10/16/2017   Procedure: CORONARY STENT INTERVENTION;  Surgeon: Verlin Bruckner BIRCH, MD;  Location: [facility] INVASIVE CV LAB;  Service: Cardiovascular;  Laterality: N/A;   FEMORAL-FEMORAL BYPASS GRAFT  07/2009   INCISION AND DRAINAGE OF WOUND Left 08/26/2023   Procedure: IRRIGATION AND DEBRIDEMENT left groin  WOUND, application of antibiotic beads;  Surgeon: Sheree Penne Bruckner, MD;  Location: [facility] OR;  Service: Vascular;  Laterality: Left;  WASHOUT LEFT GROIN   INCISION AND DRAINAGE OF WOUND Left 08/29/2023   Procedure: IRRIGATION AND DEBRIDEMENT WOUND;  Surgeon: Sheree Penne Bruckner, MD;  Location: [facility] OR;  Service: Vascular;  Laterality: Left;  WASHOUT LEFT GROIN REPEAT   LAPAROTOMY N/A 05/26/2022   Procedure: EXPLORATORY LAPAROTOMY;  Surgeon: Paola Dreama SAILOR, MD;  Location: [facility] OR;  Service: General;  Laterality: N/A;   LOWER EXTREMITY INTERVENTION Right 07/10/2023   Procedure: LOWER EXTREMITY INTERVENTION;  Surgeon: Pearline Norman RAMAN, MD;  Location: [facility] INVASIVE CV LAB;  Service: Cardiovascular;  Laterality: Right;   LUMBAR MICRODISCECTOMY  12/31/08   LYSIS OF ADHESION N/A 06/30/2023   Procedure: LAPAROSCOPIC LYSIS OF ADHESIONS;  Surgeon: Sheldon Standing, MD;  Location: [facility] ORS;  Service: General;  Laterality: N/A;   PATCH  ANGIOPLASTY Left 08/26/2023   Procedure: Primary repair of left femoral graftotomy;  Surgeon: Sheree Penne Bruckner, MD;  Location: [facility] OR;  Service: Vascular;  Laterality: Left;   PERIPHERAL VASCULAR THROMBECTOMY Right 07/10/2023   Procedure: PERIPHERAL VASCULAR THROMBECTOMY;  Surgeon: Pearline Norman RAMAN, MD;  Location: [facility] INVASIVE CV LAB;  Service: Cardiovascular;  Laterality: Right;   PROCTOSCOPY N/A 06/30/2023   Procedure: Flexible sigmoidoscopy;  Surgeon: Sheldon Standing, MD;  Location: [facility] ORS;  Service: General;  Laterality: N/A;   RIGHT/LEFT HEART CATH AND CORONARY ANGIOGRAPHY N/A 10/16/2017   Procedure: RIGHT/LEFT HEART CATH AND CORONARY ANGIOGRAPHY;  Surgeon: Verlin Bruckner BIRCH, MD;  Location: [facility] INVASIVE CV LAB;  Service: Cardiovascular;  Laterality: N/A;   SHOULDER OPEN ROTATOR CUFF REPAIR Left    VACUUM ASSISTED CLOSURE CHANGE Left 08/29/2023   Procedure: REPLACEMENT, WOUND VAC DRESSING, GROIN;  Surgeon: Sheree Penne Bruckner, MD;  Location: [facility] OR;  Service: Vascular;  Laterality: Left;   XI ROBOTIC ASSISTED COLOSTOMY TAKEDOWN N/A 06/30/2023   Procedure: XI ROBOTIC ASSISTED OSTOMY TAKEDOWN, PARTIAL RECTOSIGMOID RESECTION, BILATERAL TAP BLOCK;  Surgeon: Sheldon Standing, MD;  Location: [facility] ORS;  Service: General;  Laterality: N/A;    Allergies  Allergies  Allergen Reactions   Lisinopril  Swelling and Rash    Rash - face and tounge swell   Levaquin [Levofloxacin]     Home Medications    Prior to Admission medications   Medication Sig Start Date End Date Taking? Authorizing Provider  acetaminophen  (TYLENOL ) 500 MG tablet Take 1,000 mg by mouth every 6 (six) hours as needed for moderate pain (pain score 4-6).    [provider]  amLODipine  (NORVASC ) 5 MG tablet TAKE 1 TABLET(5 MG) BY MOUTH DAILY 11/13/23   Krasowski, Robert J, MD  aspirin  EC 81 MG tablet Take 1 tablet (81 mg total) by mouth daily. Swallow whole. 05/10/21   Verlin Bruckner BIRCH, MD  carvedilol  (COREG ) 12.5  MG tablet Take 1 tablet (12.5 mg total) by mouth 2 (two) times daily with a meal. 01/24/23   Bernie Lamar PARAS, MD  cefadroxil  (DURICEF) 500 MG capsule Take 2 capsules (1,000 mg total) by mouth 2 (two) times daily. 10/05/23   Dennise Kingsley, MD  cetirizine  (ZYRTEC ) 10 MG tablet Take 1 tablet (10 mg total) by mouth daily. 02/09/23   Fleeta Valeria Mayo, MD  cyclobenzaprine  (FLEXERIL ) 10 MG tablet Take 1 tablet (10 mg total) by mouth 3 (three) times daily as needed for muscle spasms. 05/24/23   Fleeta Valeria Mayo, MD  empagliflozin  (JARDIANCE ) 10 MG TABS tablet Take 1 tablet (10 mg total) by mouth daily. 01/04/24   Shamleffer, Ibtehal Jaralla, MD  ezetimibe  (ZETIA ) 10 MG tablet Take 1 tablet (10 mg total) by mouth daily. 03/01/23 02/24/24  Krasowski, Robert J, MD  furosemide  (LASIX ) 20 MG tablet Take 1 tablet (20 mg total) by mouth every morning. 12/27/23 12/26/24  Fleeta Valeria Mayo, MD  glipiZIDE  (GLUCOTROL ) 5 MG tablet Take 1 tablet (5 mg total) by mouth 2 (two) times  daily before a meal. 01/04/24   Shamleffer, Donell Cardinal, MD  insulin  glargine (LANTUS ) 100 unit/mL SOPN Inject 40 Units into the skin daily. 46 units subcut daily at nighttime 01/04/24   Shamleffer, Ibtehal Jaralla, MD  Insulin  Pen Needle 32G X 4 MM MISC 1 Device by Does not apply route daily in the afternoon. 01/04/24   Shamleffer, Ibtehal Jaralla, MD  isosorbide  dinitrate (ISORDIL ) 10 MG tablet Take 1 tablet (10 mg total) by mouth 2 (two) times daily. 09/15/23   Carlin Delon BROCKS, NP  loperamide  (IMODIUM  A-D) 2 MG tablet Take 2 mg by mouth 4 (four) times daily as needed for diarrhea or loose stools.    [provider]  nitroGLYCERIN  (NITROSTAT ) 0.4 MG SL tablet Place 0.4 mg under the tongue every 5 (five) minutes as needed for chest pain.    [provider]  oxyCODONE -acetaminophen  (PERCOCET) 5-325 MG tablet Take 1 tablet by mouth every 6 (six) hours as needed for severe pain (pain score 7-10). 09/01/23 08/31/24  Baglia, Corrina, PA-C   rivaroxaban  (XARELTO ) 20 MG TABS tablet Take 1 tablet (20 mg total) by mouth daily with supper. 12/23/22   Fleeta Valeria Mayo, MD  rosuvastatin  (CRESTOR ) 40 MG tablet TAKE 1 TABLET(40 MG) BY MOUTH DAILY 02/05/24   Fleeta Valeria, Mayo, MD  tamsulosin  (FLOMAX ) 0.4 MG CAPS capsule Take 0.8 mg by mouth every evening. 03/31/23   [provider]  tirzepatide  (MOUNJARO ) 5 MG/0.5ML Pen Inject 5 mg into the skin once a week. 01/04/24   Shamleffer, Donell Cardinal, MD    Physical Exam    Vital Signs:  [name] does not have vital signs available for review today.  Given telephonic nature of communication, physical exam is limited. AAOx3. NAD. Normal affect.  Speech and respirations are unlabored.  Accessory Clinical Findings    None  Assessment & Plan    1.  Preoperative Cardiovascular Risk Assessment: According to the Revised Cardiac Risk Index (RCRI), his Perioperative Risk of Major Cardiac Event is (%): 11  His Functional Capacity in METs is: 4.64 according to the [employer] Activity Status Index (DASI).  Echo 12/23/2023: LVEF 30-35%, moderately decreased function, G2DD LHC 2019: CTO RCA w/ left-to-right collaterals, PCI in-stent restenosis w/ DES in prox LAD, Ost LM-Mid LM 30% stenosed The patient is at high risk for perioperative cardiac complications but meets 4 METS, from a cardiac capacity is doing well.  Further testing will not change how his cardiac status is managed. May proceed with colonoscopy and egd.  The patient was advised that if he develops new symptoms prior to surgery to contact our office to arrange for a follow-up visit, and he verbalized understanding.  Per office protocol, patient can hold Xarelto  for 2 days prior to procedure.     Patient will not need bridging with Lovenox  (enoxaparin ) around procedure.  Dr. Verlin previously cleared patient to hold aspirin  for colonoscopy on 05/14/2021. Regarding ASA therapy, we recommend continuation of ASA throughout the  perioperative period. However, if the surgeon feels that cessation of ASA is required in the perioperative period, it may be stopped 5-7 days prior to surgery with a plan to resume it as soon as felt to be feasible from a surgical standpoint in the post-operative period.    A copy of this note will be routed to requesting surgeon.  Time:   Today, I have spent 10 minutes with the patient with telehealth technology discussing medical history, symptoms, and management plan.     Myrth Dahan  FORBES Shams, NP  02/16/2024, 8:39 AM

## 2024-02-18 ENCOUNTER — Other Ambulatory Visit: Payer: Self-pay

## 2024-02-18 ENCOUNTER — Inpatient Hospital Stay (HOSPITAL_COMMUNITY)
Admission: EM | Admit: 2024-02-18 | Discharge: 2024-02-25 | DRG: 378 | Disposition: A | Discharge status: Home / Self Care | Attending: Family Medicine | Admitting: Family Medicine

## 2024-02-18 ENCOUNTER — Encounter (HOSPITAL_COMMUNITY): Payer: Self-pay

## 2024-02-18 DIAGNOSIS — A4901 Methicillin susceptible Staphylococcus aureus infection, unspecified site: Secondary | ICD-10-CM | POA: Diagnosis present

## 2024-02-18 DIAGNOSIS — D125 Benign neoplasm of sigmoid colon: Secondary | ICD-10-CM | POA: Diagnosis present

## 2024-02-18 DIAGNOSIS — E7800 Pure hypercholesterolemia, unspecified: Secondary | ICD-10-CM | POA: Diagnosis present

## 2024-02-18 DIAGNOSIS — Z7985 Long-term (current) use of injectable non-insulin antidiabetic drugs: Secondary | ICD-10-CM | POA: Diagnosis not present

## 2024-02-18 DIAGNOSIS — I252 Old myocardial infarction: Secondary | ICD-10-CM

## 2024-02-18 DIAGNOSIS — B9561 Methicillin susceptible Staphylococcus aureus infection as the cause of diseases classified elsewhere: Secondary | ICD-10-CM | POA: Diagnosis present

## 2024-02-18 DIAGNOSIS — D649 Anemia, unspecified: Secondary | ICD-10-CM | POA: Diagnosis present

## 2024-02-18 DIAGNOSIS — K635 Polyp of colon: Secondary | ICD-10-CM | POA: Diagnosis not present

## 2024-02-18 DIAGNOSIS — N401 Enlarged prostate with lower urinary tract symptoms: Secondary | ICD-10-CM | POA: Diagnosis present

## 2024-02-18 DIAGNOSIS — I5042 Chronic combined systolic (congestive) and diastolic (congestive) heart failure: Secondary | ICD-10-CM | POA: Diagnosis present

## 2024-02-18 DIAGNOSIS — E039 Hypothyroidism, unspecified: Secondary | ICD-10-CM | POA: Diagnosis present

## 2024-02-18 DIAGNOSIS — Z7982 Long term (current) use of aspirin: Secondary | ICD-10-CM

## 2024-02-18 DIAGNOSIS — Z794 Long term (current) use of insulin: Secondary | ICD-10-CM

## 2024-02-18 DIAGNOSIS — Z0389 Encounter for observation for other suspected diseases and conditions ruled out: Secondary | ICD-10-CM | POA: Diagnosis not present

## 2024-02-18 DIAGNOSIS — I130 Hypertensive heart and chronic kidney disease with heart failure and stage 1 through stage 4 chronic kidney disease, or unspecified chronic kidney disease: Secondary | ICD-10-CM | POA: Diagnosis present

## 2024-02-18 DIAGNOSIS — T827XXS Infection and inflammatory reaction due to other cardiac and vascular devices, implants and grafts, sequela: Secondary | ICD-10-CM | POA: Diagnosis not present

## 2024-02-18 DIAGNOSIS — K5521 Angiodysplasia of colon with hemorrhage: Secondary | ICD-10-CM | POA: Diagnosis present

## 2024-02-18 DIAGNOSIS — N179 Acute kidney failure, unspecified: Secondary | ICD-10-CM | POA: Diagnosis present

## 2024-02-18 DIAGNOSIS — Z83438 Family history of other disorder of lipoprotein metabolism and other lipidemia: Secondary | ICD-10-CM

## 2024-02-18 DIAGNOSIS — Q2733 Arteriovenous malformation of digestive system vessel: Secondary | ICD-10-CM | POA: Diagnosis not present

## 2024-02-18 DIAGNOSIS — D62 Acute posthemorrhagic anemia: Secondary | ICD-10-CM | POA: Diagnosis present

## 2024-02-18 DIAGNOSIS — Z7984 Long term (current) use of oral hypoglycemic drugs: Secondary | ICD-10-CM | POA: Diagnosis not present

## 2024-02-18 DIAGNOSIS — D500 Iron deficiency anemia secondary to blood loss (chronic): Secondary | ICD-10-CM | POA: Diagnosis present

## 2024-02-18 DIAGNOSIS — Z87891 Personal history of nicotine dependence: Secondary | ICD-10-CM

## 2024-02-18 DIAGNOSIS — Z823 Family history of stroke: Secondary | ICD-10-CM

## 2024-02-18 DIAGNOSIS — R195 Other fecal abnormalities: Secondary | ICD-10-CM | POA: Diagnosis not present

## 2024-02-18 DIAGNOSIS — E1151 Type 2 diabetes mellitus with diabetic peripheral angiopathy without gangrene: Secondary | ICD-10-CM | POA: Diagnosis present

## 2024-02-18 DIAGNOSIS — D122 Benign neoplasm of ascending colon: Secondary | ICD-10-CM | POA: Diagnosis not present

## 2024-02-18 DIAGNOSIS — I255 Ischemic cardiomyopathy: Secondary | ICD-10-CM | POA: Diagnosis present

## 2024-02-18 DIAGNOSIS — K625 Hemorrhage of anus and rectum: Principal | ICD-10-CM

## 2024-02-18 DIAGNOSIS — Z5971 Insufficient health insurance coverage: Secondary | ICD-10-CM

## 2024-02-18 DIAGNOSIS — N400 Enlarged prostate without lower urinary tract symptoms: Secondary | ICD-10-CM | POA: Diagnosis present

## 2024-02-18 DIAGNOSIS — K31819 Angiodysplasia of stomach and duodenum without bleeding: Secondary | ICD-10-CM | POA: Diagnosis not present

## 2024-02-18 DIAGNOSIS — Z8551 Personal history of malignant neoplasm of bladder: Secondary | ICD-10-CM

## 2024-02-18 DIAGNOSIS — I25119 Atherosclerotic heart disease of native coronary artery with unspecified angina pectoris: Secondary | ICD-10-CM | POA: Diagnosis not present

## 2024-02-18 DIAGNOSIS — I480 Paroxysmal atrial fibrillation: Secondary | ICD-10-CM | POA: Diagnosis present

## 2024-02-18 DIAGNOSIS — Z833 Family history of diabetes mellitus: Secondary | ICD-10-CM

## 2024-02-18 DIAGNOSIS — Z8249 Family history of ischemic heart disease and other diseases of the circulatory system: Secondary | ICD-10-CM

## 2024-02-18 DIAGNOSIS — N1832 Chronic kidney disease, stage 3b: Secondary | ICD-10-CM | POA: Diagnosis present

## 2024-02-18 DIAGNOSIS — Z955 Presence of coronary angioplasty implant and graft: Secondary | ICD-10-CM

## 2024-02-18 DIAGNOSIS — D509 Iron deficiency anemia, unspecified: Secondary | ICD-10-CM | POA: Diagnosis not present

## 2024-02-18 DIAGNOSIS — Z79899 Other long term (current) drug therapy: Secondary | ICD-10-CM

## 2024-02-18 DIAGNOSIS — K31811 Angiodysplasia of stomach and duodenum with bleeding: Principal | ICD-10-CM | POA: Diagnosis present

## 2024-02-18 DIAGNOSIS — Z7901 Long term (current) use of anticoagulants: Secondary | ICD-10-CM | POA: Diagnosis not present

## 2024-02-18 DIAGNOSIS — Z888 Allergy status to other drugs, medicaments and biological substances status: Secondary | ICD-10-CM

## 2024-02-18 DIAGNOSIS — E1122 Type 2 diabetes mellitus with diabetic chronic kidney disease: Secondary | ICD-10-CM | POA: Diagnosis present

## 2024-02-18 DIAGNOSIS — E785 Hyperlipidemia, unspecified: Secondary | ICD-10-CM | POA: Diagnosis present

## 2024-02-18 DIAGNOSIS — I5022 Chronic systolic (congestive) heart failure: Secondary | ICD-10-CM | POA: Diagnosis present

## 2024-02-18 DIAGNOSIS — D123 Benign neoplasm of transverse colon: Secondary | ICD-10-CM | POA: Diagnosis not present

## 2024-02-18 DIAGNOSIS — K922 Gastrointestinal hemorrhage, unspecified: Secondary | ICD-10-CM | POA: Diagnosis present

## 2024-02-18 DIAGNOSIS — E119 Type 2 diabetes mellitus without complications: Secondary | ICD-10-CM

## 2024-02-18 DIAGNOSIS — I2510 Atherosclerotic heart disease of native coronary artery without angina pectoris: Secondary | ICD-10-CM | POA: Diagnosis present

## 2024-02-18 DIAGNOSIS — E1165 Type 2 diabetes mellitus with hyperglycemia: Secondary | ICD-10-CM | POA: Diagnosis present

## 2024-02-18 DIAGNOSIS — K5730 Diverticulosis of large intestine without perforation or abscess without bleeding: Secondary | ICD-10-CM | POA: Diagnosis not present

## 2024-02-18 DIAGNOSIS — K5731 Diverticulosis of large intestine without perforation or abscess with bleeding: Secondary | ICD-10-CM | POA: Diagnosis present

## 2024-02-18 DIAGNOSIS — Z980 Intestinal bypass and anastomosis status: Secondary | ICD-10-CM | POA: Diagnosis not present

## 2024-02-18 DIAGNOSIS — Z789 Other specified health status: Secondary | ICD-10-CM

## 2024-02-18 DIAGNOSIS — L8990 Pressure ulcer of unspecified site, unspecified stage: Secondary | ICD-10-CM | POA: Insufficient documentation

## 2024-02-18 DIAGNOSIS — I5020 Unspecified systolic (congestive) heart failure: Secondary | ICD-10-CM | POA: Diagnosis not present

## 2024-02-18 DIAGNOSIS — I10 Essential (primary) hypertension: Secondary | ICD-10-CM | POA: Diagnosis present

## 2024-02-18 LAB — COMPREHENSIVE METABOLIC PANEL WITH GFR
ALT: 11 U/L (ref 0–44)
AST: 15 U/L (ref 15–41)
Albumin: 3.1 g/dL — ABNORMAL LOW (ref 3.5–5.0)
Alkaline Phosphatase: 50 U/L (ref 38–126)
Anion gap: 11 (ref 5–15)
BUN: 32 mg/dL — ABNORMAL HIGH (ref 8–23)
CO2: 23 mmol/L (ref 22–32)
Calcium: 8.5 mg/dL — ABNORMAL LOW (ref 8.9–10.3)
Chloride: 101 mmol/L (ref 98–111)
Creatinine, Ser: 1.98 mg/dL — ABNORMAL HIGH (ref 0.61–1.24)
GFR, Estimated: 34 mL/min — ABNORMAL LOW (ref >60)
Glucose, Bld: 291 mg/dL — ABNORMAL HIGH (ref 70–99)
Potassium: 3.9 mmol/L (ref 3.5–5.1)
Sodium: 135 mmol/L (ref 135–145)
Total Bilirubin: 0.6 mg/dL (ref 0.0–1.2)
Total Protein: 5.7 g/dL — ABNORMAL LOW (ref 6.5–8.1)

## 2024-02-18 LAB — CBG MONITORING, ED: Glucose-Capillary: 305 mg/dL — ABNORMAL HIGH (ref 70–99)

## 2024-02-18 LAB — CBC
HCT: 24.2 % — ABNORMAL LOW (ref 39.0–52.0)
Hemoglobin: 7.2 g/dL — ABNORMAL LOW (ref 13.0–17.0)
MCH: 25 pg — ABNORMAL LOW (ref 26.0–34.0)
MCHC: 29.8 g/dL — ABNORMAL LOW (ref 30.0–36.0)
MCV: 84 fL (ref 80.0–100.0)
Platelets: 273 K/uL (ref 150–400)
RBC: 2.88 MIL/uL — ABNORMAL LOW (ref 4.22–5.81)
RDW: 27.2 % — ABNORMAL HIGH (ref 11.5–15.5)
WBC: 5.8 K/uL (ref 4.0–10.5)
nRBC: 0 % (ref 0.0–0.2)

## 2024-02-18 LAB — POC OCCULT BLOOD, ED: Fecal Occult Bld: POSITIVE — AB

## 2024-02-18 LAB — GLUCOSE, CAPILLARY: Glucose-Capillary: 165 mg/dL — ABNORMAL HIGH (ref 70–99)

## 2024-02-18 LAB — PROTIME-INR
INR: 1.8 — ABNORMAL HIGH (ref 0.8–1.2)
Prothrombin Time: 21.8 s — ABNORMAL HIGH (ref 11.4–15.2)

## 2024-02-18 LAB — PREPARE RBC (CROSSMATCH)

## 2024-02-18 MED ORDER — TAMSULOSIN HCL 0.4 MG PO CAPS: 0.8000 mg | ORAL_CAPSULE | Freq: Every evening | ORAL | Status: DC

## 2024-02-18 MED ORDER — AMLODIPINE BESYLATE 5 MG PO TABS
5.0000 mg | ORAL_TABLET | Freq: Every day | ORAL | Status: DC
  Administered 2024-02-19 – 2024-02-25 (×7): 5 mg via ORAL
  Filled 2024-02-18 (×7): qty 1

## 2024-02-18 MED ORDER — PANTOPRAZOLE SODIUM 40 MG IV SOLR
40.0000 mg | Freq: Two times a day (BID) | INTRAVENOUS | Status: DC
  Administered 2024-02-18 – 2024-02-20 (×4): 40 mg via INTRAVENOUS
  Filled 2024-02-18 (×4): qty 10

## 2024-02-18 MED ORDER — ASPIRIN 81 MG PO TBEC
DELAYED_RELEASE_TABLET | ORAL | Status: AC
  Filled 2024-02-18: qty 1

## 2024-02-18 MED ORDER — CARVEDILOL 12.5 MG PO TABS
12.5000 mg | ORAL_TABLET | Freq: Two times a day (BID) | ORAL | Status: DC
  Administered 2024-02-19 – 2024-02-25 (×13): 12.5 mg via ORAL
  Filled 2024-02-18 (×13): qty 1

## 2024-02-18 MED ORDER — ACETAMINOPHEN 325 MG PO TABS
650.0000 mg | ORAL_TABLET | Freq: Four times a day (QID) | ORAL | Status: DC | PRN
  Administered 2024-02-24: 650 mg via ORAL
  Filled 2024-02-18: qty 2

## 2024-02-18 MED ORDER — ACETAMINOPHEN 650 MG RE SUPP: 650.0000 mg | Freq: Four times a day (QID) | RECTAL | Status: DC | PRN

## 2024-02-18 MED ORDER — EZETIMIBE 10 MG PO TABS
10.0000 mg | ORAL_TABLET | Freq: Every day | ORAL | Status: DC
  Administered 2024-02-19 – 2024-02-25 (×7): 10 mg via ORAL
  Filled 2024-02-18 (×7): qty 1

## 2024-02-18 MED ORDER — CEFADROXIL 500 MG PO CAPS
1000.0000 mg | ORAL_CAPSULE | Freq: Two times a day (BID) | ORAL | Status: DC
  Administered 2024-02-18: 1000 mg via ORAL
  Filled 2024-02-18 (×3): qty 2

## 2024-02-18 MED ORDER — ROSUVASTATIN CALCIUM 20 MG PO TABS
40.0000 mg | ORAL_TABLET | Freq: Every day | ORAL | Status: DC
  Administered 2024-02-19 – 2024-02-25 (×7): 40 mg via ORAL
  Filled 2024-02-18 (×7): qty 2

## 2024-02-18 MED ORDER — ASPIRIN 81 MG PO TBEC: 81.0000 mg | DELAYED_RELEASE_TABLET | Freq: Every day | ORAL | Status: DC

## 2024-02-18 MED ORDER — FUROSEMIDE 20 MG PO TABS: 20.0000 mg | ORAL_TABLET | Freq: Every morning | ORAL | Status: DC

## 2024-02-18 MED ORDER — SODIUM CHLORIDE 0.9% IV SOLUTION: Freq: Once | INTRAVENOUS | Status: AC

## 2024-02-18 MED ORDER — INSULIN ASPART 100 UNIT/ML IJ SOLN
0.0000 [IU] | Freq: Three times a day (TID) | INTRAMUSCULAR | Status: DC
  Administered 2024-02-19: 3 [IU] via SUBCUTANEOUS

## 2024-02-18 MED ORDER — INSULIN GLARGINE-YFGN 100 UNIT/ML ~~LOC~~ SOLN
20.0000 [IU] | Freq: Every day | SUBCUTANEOUS | Status: DC
  Administered 2024-02-18 – 2024-02-24 (×7): 20 [IU] via SUBCUTANEOUS
  Filled 2024-02-18 (×8): qty 0.2

## 2024-02-18 NOTE — Assessment & Plan Note (Addendum)
 Hgb 7.2 on admission. Anemia most likely in the setting of aspirin  use, Xarelto  use, sigmoidectomy with colostomy with takedown 06/2023. - Admit to medsurg under Dr. Donzetta  - Transfuse 1u PRBC in ED, follow up H/H 0100. Transfusion threshold 8.  - NPO after midnight  - Hold Xarelto , continue home ASA - IV Protonix  40mg  BID - GI consulted and to see in AM, appreciate recommendations - AM CBC, BMP

## 2024-02-18 NOTE — Assessment & Plan Note (Signed)
 12/23/2023 echo: inferior and inferoseptal akinesis with mod-sev LV dysfuntion. EF 30-35%. LV with mod decreased function and regional wall abnormalities consistent with Grade II DD.  - Hold home furosemide  as above in setting of AKI

## 2024-02-18 NOTE — Assessment & Plan Note (Addendum)
-   Hold flomax , as patient not taking - Restart if retaining or LUTS sxs develop

## 2024-02-18 NOTE — Assessment & Plan Note (Signed)
 On problem list however patient not on Synthroid at this time.  - AM TSH/T4

## 2024-02-18 NOTE — ED Provider Notes (Signed)
 [place] EMERGENCY DEPARTMENT AT [facility] Provider Note  MDM   HPI/ROS:  [name] is a 78 y.o. male with a PMH symptomatic anemia requiring transfusions, ischemic limb treated with mechanical thrombectomy, right SFA, paroxysmal A-fib on Coumadin, HLD, HTN, HFrEF, PAD, T2DM, who presents to the emergency department for rectal bleeding.  Patient reports that he has been having bloody stools ongoing for the last several months, has worsened over the last week with more episodes of dark black tarry stools.  He also endorses some generalized weakness.  He denies any abdominal pain, nausea, vomiting, fevers.  Differential diagnose includes but is not limited to GI bleed, anemia, electrolyte derangement, infection  On my initial evaluation, patient is:  -Vital signs stable. Patient afebrile, hemodynamically stable, and non-toxic appearing.  Physical exam is notable for: -Patient in no acute distress, nondiaphoretic, pale with pale conjunctive but skin turgor and capillary refill appropriate -- Abdomen is soft, nontender, distended but nonrigid or nonperitonitic.  Patient does have midline abdominal incision scar as well as prior colostomy scar  DRE with brown stool, but it is Hemoccult positive.  CMP with evidence of AKI, creatinine is 1.98 and BUN at 32, no anion gap, CBG 305, elevated PT and INR 21.8 and 1.8 respectively.  CBC with hemoglobin of 7.2 (down from 10.2 15-month ago).  1 unit PRBCs ordered for transfusion.  Secure chat message was sent to GI who agreed to see patient in a.m. for concern for GI bleed.  Interpretations, interventions, and the patient's course of care are documented below.    Disposition:  I discussed the case with family medicine who graciously agreed to admit the patient to their service for continued care.   Clinical Impression:  1. Rectal bleeding   2. Acute GI bleeding      Clinical Complexity A medically appropriate history, review of  systems, and physical exam was performed.  My independent interpretations of EKG, labs, and radiology are documented in the ED course above.   If decision rules were used in this patient's evaluation, they are listed below.   Click here for ABCD2, HEART and other calculatorsREFRESH Note before signing   Patient's presentation is most consistent with acute complicated illness / injury requiring diagnostic workup.  Medical Decision Making Amount and/or Complexity of Data Reviewed Labs: ordered.  Risk Prescription drug management. Decision regarding hospitalization.    HPI/ROS      See MDM section for pertinent HPI and ROS. A complete ROS was performed with pertinent positives/negatives noted above.   Past Medical History:  Diagnosis Date   Acute low back pain 01/12/2015   Acute lower limb ischemia 07/11/2023   Acute pain of both shoulders 07/22/2022   AKI (acute kidney injury) 05/24/2022   Arthritis    Atherosclerosis of native arteries of the extremities with intermittent claudication 08/13/2013   Atherosclerosis of native artery of extremity with intermittent claudication 08/13/2013   IMO SNOMED Dx Update Oct 2024     Backache 02/08/2013   Benign prostatic hyperplasia with urinary frequency 02/23/2023   Bladder cancer (HCC)    resection x3   BMI 27.0-27.9,adult 02/23/2023   Cellulitis 07/17/2023   Chronic combined systolic (congestive) and diastolic (congestive) heart failure (HCC) 07/22/2022   Chronic left shoulder pain 05/24/2023   Chronic right shoulder pain 09/02/2022   Chronic systolic CHF (congestive heart failure) (HCC)    CKD stage 3b, GFR 30-44 ml/min (HCC) 07/03/2023   Coronary artery disease    status post DMI  RX Taxus stent RCA 2006 with susequent Stent LAD and subsequent  stent thrombosis RCA unable to be opened 2006 -neg mv 10/2008, 10/16/17 ISR to pLAD with PTCA/DES, CTO of RCA with collaterals, EF 25%   Coronary artery disease involving native coronary  artery of native heart with unstable angina pectoris (HCC)    Current moderate episode of major depressive disorder without prior episode (HCC) 03/28/2022   Diverticulitis 06/10/2022   Diverticulitis large intestine 05/11/2021   Diverticulitis of large intestine with abscess 05/24/2022   Diverticulitis of sigmoid colon 05/11/2021   DM2 (diabetes mellitus, type 2) (HCC) 02/09/2022   status post bilateral aortobifemoral bypass [id number] with recent fem to fembypass April  2011 per DR. Early     Dyssynergic defecation 03/21/2023   Essential hypertension 07/22/2022   Fatigue 03/13/2023   History of bladder cancer 02/23/2023   History of colonic diverticulitis 03/21/2023   Hypercholesterolemia 10/28/2022   HYPERLIPIDEMIA-MIXED 09/22/2008   Qualifier: Diagnosis of  By: Justina Kos     Hypothyroidism 05/24/2022   Ischemic cardiomyopathy    ejection fraction of 40-45%   Lethargy 03/28/2022   Lumbar spinal stenosis    Myocardial infarction (HCC)    I've had 4 (10/16/2017)   Need for prophylactic vaccination and inoculation against influenza 03/28/2022   NSVT (nonsustained ventricular tachycardia) (HCC)    PAD (peripheral artery disease) 07/17/2023   PAF (paroxysmal atrial fibrillation) (HCC) 02/09/2022   Paroxysmal atrial fibrillation (HCC)    Peripheral arterial disease    Peripheral artery disease 07/09/2023   Poorly controlled T2 diabetes mellitus (HCC) 09/27/2022   Protein-calorie malnutrition, severe 05/29/2022   PVC's (premature ventricular contractions)    PVD 09/22/2008   Qualifier: Diagnosis of  By: Justina Kos     PVD (peripheral vascular disease) 02/23/2023   Rotator cuff arthropathy of right shoulder 09/27/2022   Sigmoid diverticulitis 06/30/2023   Status post coronary artery stent placement    Status post Hartmann's procedure (HCC) 03/21/2023   TOBACCO ABUSE 05/29/2009   Qualifier: Diagnosis of  By: Velinda, RN, BSN, Avelina CROME    Trigger ring finger of right hand  03/29/2023   Type 2 diabetes mellitus with other circulatory complications (HCC) 07/22/2022   Type II diabetes mellitus (HCC)    Unstable angina (HCC) 10/16/2017    Past Surgical History:  Procedure Laterality Date   ABDOMINAL AORTOGRAM W/LOWER EXTREMITY Right 07/10/2023   Procedure: ABDOMINAL AORTOGRAM W/LOWER EXTREMITY;  Surgeon: Pearline Norman RAMAN, MD;  Location: [facility] INVASIVE CV LAB;  Service: Cardiovascular;  Laterality: Right;   AORTA - BILATERAL FEMORAL ARTERY BYPASS GRAFT Bilateral 1992   APPENDECTOMY     APPLICATION OF WOUND VAC Left 08/26/2023   Procedure: APPLICATION, WOUND VAC left groin;  Surgeon: Sheree Penne Bruckner, MD;  Location: [facility] OR;  Service: Vascular;  Laterality: Left;   APPLICATION, SKIN SUBSTITUTE Left 08/29/2023   Procedure: APPLICATION, SKIN SUBSTITUTE;  Surgeon: Sheree Penne Bruckner, MD;  Location: [facility] OR;  Service: Vascular;  Laterality: Left;   BACK SURGERY     COLECTOMY WITH COLOSTOMY CREATION/HARTMANN PROCEDURE N/A 05/26/2022   Procedure: COLECTOMY WITH COLOSTOMY CREATION/HARTMANN PROCEDURE;  Surgeon: Paola Dreama SAILOR, MD;  Location: [facility] OR;  Service: General;  Laterality: N/A;   CORONARY ANGIOPLASTY WITH STENT PLACEMENT     taxus stent  placed into his right coronary artery; stent placed to the LAD.     CORONARY STENT INTERVENTION N/A 10/16/2017   Procedure: CORONARY STENT INTERVENTION;  Surgeon: Verlin Bruckner BIRCH, MD;  Location: [facility] INVASIVE  CV LAB;  Service: Cardiovascular;  Laterality: N/A;   FEMORAL-FEMORAL BYPASS GRAFT  07/2009   INCISION AND DRAINAGE OF WOUND Left 08/26/2023   Procedure: IRRIGATION AND DEBRIDEMENT left groin  WOUND, application of antibiotic beads;  Surgeon: Sheree Penne Bruckner, MD;  Location: [facility] OR;  Service: Vascular;  Laterality: Left;  WASHOUT LEFT GROIN   INCISION AND DRAINAGE OF WOUND Left 08/29/2023   Procedure: IRRIGATION AND DEBRIDEMENT WOUND;  Surgeon: Sheree Penne Bruckner, MD;  Location: [facility] OR;  Service: Vascular;   Laterality: Left;  WASHOUT LEFT GROIN REPEAT   LAPAROTOMY N/A 05/26/2022   Procedure: EXPLORATORY LAPAROTOMY;  Surgeon: Paola Dreama SAILOR, MD;  Location: [facility] OR;  Service: General;  Laterality: N/A;   LOWER EXTREMITY INTERVENTION Right 07/10/2023   Procedure: LOWER EXTREMITY INTERVENTION;  Surgeon: Pearline Norman RAMAN, MD;  Location: [facility] INVASIVE CV LAB;  Service: Cardiovascular;  Laterality: Right;   LUMBAR MICRODISCECTOMY  12/31/08   LYSIS OF ADHESION N/A 06/30/2023   Procedure: LAPAROSCOPIC LYSIS OF ADHESIONS;  Surgeon: Sheldon Standing, MD;  Location: [facility] ORS;  Service: General;  Laterality: N/A;   PATCH ANGIOPLASTY Left 08/26/2023   Procedure: Primary repair of left femoral graftotomy;  Surgeon: Sheree Penne Bruckner, MD;  Location: [facility] OR;  Service: Vascular;  Laterality: Left;   PERIPHERAL VASCULAR THROMBECTOMY Right 07/10/2023   Procedure: PERIPHERAL VASCULAR THROMBECTOMY;  Surgeon: Pearline Norman RAMAN, MD;  Location: [facility] INVASIVE CV LAB;  Service: Cardiovascular;  Laterality: Right;   PROCTOSCOPY N/A 06/30/2023   Procedure: Flexible sigmoidoscopy;  Surgeon: Sheldon Standing, MD;  Location: [facility] ORS;  Service: General;  Laterality: N/A;   RIGHT/LEFT HEART CATH AND CORONARY ANGIOGRAPHY N/A 10/16/2017   Procedure: RIGHT/LEFT HEART CATH AND CORONARY ANGIOGRAPHY;  Surgeon: Verlin Bruckner BIRCH, MD;  Location: [facility] INVASIVE CV LAB;  Service: Cardiovascular;  Laterality: N/A;   SHOULDER OPEN ROTATOR CUFF REPAIR Left    VACUUM ASSISTED CLOSURE CHANGE Left 08/29/2023   Procedure: REPLACEMENT, WOUND VAC DRESSING, GROIN;  Surgeon: Sheree Penne Bruckner, MD;  Location: [facility] OR;  Service: Vascular;  Laterality: Left;   XI ROBOTIC ASSISTED COLOSTOMY TAKEDOWN N/A 06/30/2023   Procedure: XI ROBOTIC ASSISTED OSTOMY TAKEDOWN, PARTIAL RECTOSIGMOID RESECTION, BILATERAL TAP BLOCK;  Surgeon: Sheldon Standing, MD;  Location: [facility] ORS;  Service: General;  Laterality: N/A;      Physical Exam   Vitals:   02/18/24 1614  Temp: 98.1 F  (36.7 C)  TempSrc: Oral    Physical Exam Vitals and nursing note reviewed.  Constitutional:      General: He is not in acute distress.    Appearance: He is well-developed.  HENT:     Head: Normocephalic and atraumatic.  Eyes:     Comments: Pale conjunctiva  Cardiovascular:     Rate and Rhythm: Normal rate and regular rhythm.     Heart sounds: No murmur heard. Pulmonary:     Effort: Pulmonary effort is normal. No respiratory distress.     Breath sounds: Normal breath sounds.  Abdominal:     Palpations: Abdomen is soft.     Tenderness: There is no abdominal tenderness.     Comments: Abdomen is soft, nontender, distended but nonrigid or nonperitonitic.  Patient does have midline abdominal incision scar as well as prior colostomy scar  Musculoskeletal:        General: No swelling.     Cervical back: Neck supple.  Skin:    General: Skin is warm and dry.     Capillary Refill: Capillary refill takes less than  2 seconds.     Coloration: Skin is pale.  Neurological:     Mental Status: He is alert.    Procedures   If procedures were preformed on this patient, they are listed below:  Procedures   Please note that this documentation was produced with the assistance of voice-to-text technology and may contain errors.     Billy Pal, MD 02/18/24 [id number]    Randol Simmonds, MD 02/21/24 [phone]

## 2024-02-18 NOTE — Assessment & Plan Note (Signed)
 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 Treated with 6 weeks of IV cefazolin  prior to oral abx. Oral cefadroxil  started 10/11/2023 and set to continue for 4.5 months, culminating approximately in mid November 2025. - Continue oral cefadroxil  1000mg  BID - Consult ID for further recommendations in AM

## 2024-02-18 NOTE — Assessment & Plan Note (Addendum)
 Hgb A1C >10, 11/2023. - Sliding scale insulin  - Lantus  20u at bedtime (takes 40u at bedtime at home) - CBG before meals and at bedtime - Hold home Mounjaro , glipizide  and Jardiance 

## 2024-02-18 NOTE — Assessment & Plan Note (Signed)
 Currently in A-fib, rate controlled at 94bpm.   - Continue home ASA 81mg , hold Xarelto  2/2 GI bleed  - Continue home carvedilol  12.5mg  BID and monitor for hypotension - SCDs for VTE ppx - Consider transition to Eliquis given lower rates of GI bleed when clinically stable and appropriate

## 2024-02-18 NOTE — Assessment & Plan Note (Signed)
-   Continue home rosuvastatin  40mg  daily  - Continue home Zetia  10mg  daily

## 2024-02-18 NOTE — Assessment & Plan Note (Signed)
 Cr of 1.98 on admission, baseline 1.3 with Stage 3b CKD. - Receiving 500mL bolus with PRBC transfusion - Consider spot dosing 500mL boluses of fluid and monitor patient closely with long history of HFrEF with an EF of 25% - Hold home furosemide  20mg  daily - Avoid nephrotoxic agents

## 2024-02-18 NOTE — ED Triage Notes (Signed)
 Pt coming in from home via [place] ems. Pt is reporting bloody stools and rectal bleeding. Pt had a blood transfusion about a week ago.  Pt is a diabetic his blood sugar was 428 he was instructed to not take any insulin .   Bp 142/78 Hr 92 \ 96% on ra

## 2024-02-18 NOTE — H&P (Cosign Needed Addendum)
 Hospital Admission History and Physical Service Pager: [phone]  Patient name: [name] Medical record number: [id number] Date of Birth: Nov 23, 1945 Age: 78 y.o. Gender: male  Primary Care Provider: Fleeta Finger, Selinda, MD Consultants: GI Code Status: Full Code without life prolonging measures, advance directive on file per patient Preferred Emergency Contact: daughter [name] [phone]  Chief Complaint: bloody stools worsened over the last week  Assessment and Plan: JEF FUTCH is a 78 y.o. male presenting with acute anemia needing transfusion secondary to GI bleed, most likely due to diverticular bleeding. Differential for presentation of this includes upper/lower GI bleed, diverticular bleeding (increased risk with patient age, use of chronic ASA and Xarelto  and recent takedown 06/2023, will need r/o via colonoscopy), perforated gastric/duodenal ulcer (less likely as bleeding has been over a long period of time and painless), hemorrhoids (less likely to cause severe anemia needing transfusion), GI infection (less likely without elevated WBC count, fever, abdominal pain, acute abdomen), colorectal malignancy (less likely without b-symptoms, family history, concern on prior colonoscopies).  Assessment & Plan Anemia due to blood loss Hgb 7.2 on admission. Anemia most likely in the setting of aspirin  use, Xarelto  use, sigmoidectomy with colostomy with takedown 06/2023. - Admit to medsurg under Dr. Donzetta  - Transfuse 1u PRBC in ED, follow up H/H 0100. Transfusion threshold 8.  - NPO after midnight  - Hold Xarelto , continue home ASA - IV Protonix  40mg  BID - GI consulted and to see in AM, appreciate recommendations - AM CBC, BMP AKI (acute kidney injury) CKD stage 3b, GFR 30-44 ml/min (HCC) Cr of 1.98 on admission, baseline 1.3 with Stage 3b CKD. - Receiving 500mL bolus with PRBC transfusion - Consider spot dosing 500mL boluses of fluid and monitor patient closely with long  history of HFrEF with an EF of 25% - Hold home furosemide  20mg  daily - Avoid nephrotoxic agents PAF (paroxysmal atrial fibrillation) (HCC) Currently in A-fib, rate controlled at 94bpm.   - Continue home ASA 81mg , hold Xarelto  2/2 GI bleed  - Continue home carvedilol  12.5mg  BID and monitor for hypotension - SCDs for VTE ppx - Consider transition to Eliquis given lower rates of GI bleed when clinically stable and appropriate Type 2 diabetes mellitus treated with insulin  (HCC) Hgb A1C >10, 11/2023. - Sliding scale insulin  - Lantus  20u at bedtime (takes 40u at bedtime at home) - CBG before meals and at bedtime - Hold home Mounjaro , glipizide  and Jardiance   Chronic HFrEF (heart failure with reduced ejection fraction) (HCC) 12/23/2023 echo: inferior and inferoseptal akinesis with mod-sev LV dysfuntion. EF 30-35%. LV with mod decreased function and regional wall abnormalities consistent with Grade II DD.  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 Treated with 6 weeks of IV cefazolin  prior to oral abx. Oral cefadroxil  started 10/11/2023 and set to continue for 4.5 months, culminating approximately in mid November 2025. - Continue oral cefadroxil  1000mg  BID - Consult ID for further recommendations in AM HTN (hypertension) 104/88 on admission. - Continue home amlodipine  5mg  daily - Continue home carvedilol  as above Hyperlipidemia - Continue home rosuvastatin  40mg  daily  - Continue home Zetia  10mg  daily  BPH (benign prostatic hyperplasia) - Hold flomax , as patient not taking - Restart if retaining or LUTS sxs develop Hypothyroidism On problem list however patient not on Synthroid at this time.  - AM TSH/T4  Chronic and Stable  Problems: PAD/PVD, CAD, BPH  FEN/GI: NPO at midnight VTE Prophylaxis: SCDs  Disposition: Admit to medsurg  History of  Present Illness:  [name] is a 78 y.o. male w/PMHx of insulin  dependent DMT2, hypothyroidism, PAD/PVD, chronic CHF, HTN, HLD, CKD Stage 3b, PAF on Xarelto , history of bladder cancer, diverticulitis s/p takedown 06/2023, CAD, ischemic cardiomyopathy with EF of 30-35%, R SFA graft with MSSA infection on oral abx, presenting with acute anemia secondary to GI bleed. Patient girlfriend is at bedside and primary historian. She  reports bloody stool for the last few months, with patient needing a blood transfusion approximately 2 months ago. She noticed the bloody stools have been worsening over the last 5 days. She reports fatigue and lack of appetite. She states the patient noted blood on toilet tissue this morning but was able to go to church. When he came home from church, he was so tired he immediately fell asleep. His girlfriend had to wake him up to take a few bites of food and he went immediately back to sleep. The patient used the bathroom some time after that and noted more blood than earlier today, so patient girlfriend encouraged him to be evaluated in the ED. He has a colonoscopy scheduled for 11/04. Patient denies CP, SOB, LE edema, N/V/D, hematemesis, or any other complaints.   The patient is currently taking 1000mg  oral cefadroxil  BID for R SFA graft MSSA infection. He had an appointment with ID for follow up 11/05 however they called and moved the appointment to next Wednesday. The patient denies any site or leg pain, fevers, or obvious skin infection.   In the ED, hemoccult test positive. Labs significant for AKI with creatinine of 1.98, BUN 32, and GFR of 34, glucose of 291, H/H of 7.2/24.2, PT/INR 21.8/1.8 (most likely d/t Xarelto ), EKG significant for atrial fibrillation at a rate of 94bpm. GI was consulted and will see the patient in the AM. A unit of blood was ordered for transfusion.   Review Of Systems: Per HPI   Pertinent Past Medical History: Insulin  dependent diabetes type  2 Hypothyroidism PAD/PVD Chronic CHF HTN HLD CKD stage IIIb PAF on Xarelto  History of bladder cancer CAD Ischemic cardiomyopathy Remainder reviewed in history tab.   Pertinent Past Surgical History: Diverticulitis status post takedown 06/2023 Right SFA graft 06/2023 with postsurgical MSSA infection  Remainder reviewed in history tab.   Pertinent Social History: Tobacco use: Yes/No/Former Alcohol use: no Other Substance use: no Lives by himself   Pertinent Family History: None Remainder reviewed in history tab.   Important Outpatient Medications: Amlodipine  5 mg daily Aspirin  81 mg daily Carvedilol  12.5 mg twice daily Cefadroxil  1000 mg twice daily Cyclobenzaprine  10 mg 3 times daily as needed Jardiance  10 mg daily Zetia  10 mg daily Lasix  20 mg MWF Glipizide  5 mg twice daily - not taking  Lantus  40 units daily at night Isordil  10 mg twice daily - not taking Nitroglycerin  0.4 mg sublingual as needed Xarelto  20 mg daily Crestor  40 mg daily Flomax  0.8 mg daily - not taking  Mounjaro  5 mg weekly Remainder reviewed in medication history.   Objective: BP 104/88   Pulse 86   Temp 98.6 F (37 C) (Oral)   Resp 16   Ht 5' 7 (1.702 m)   Wt 81.2 kg   SpO2 99%   BMI 28.04 kg/m  Exam: General: NAD, pale, non-toxic appearing, alert, pleasant Eyes: non-icteric, PERRLA, EOMI ENTM: hard of hearing, clear nares, clear oropharynx Neck: supple,  FROM Cardiovascular: irregularly irregular, no murmurs Respiratory: CTAB, normal WOB, no w/r/r Gastrointestinal: soft, non-tender, non-distended, BS present MSK: moves all extremities equally, no LE edema Derm: diffuse pallor, no rashes or lesions Neuro: alert and oriented x4, CN II-XII grossly intact Psych: appropriate mood and affect  Labs:  CBC BMET  Recent Labs  Lab 02/18/24 1634  WBC 5.8  HGB 7.2*  HCT 24.2*  PLT 273   Recent Labs  Lab 02/18/24 1658  NA 135  K 3.9  CL 101  CO2 23  BUN 32*  CREATININE  1.98*  GLUCOSE 291*  CALCIUM  8.5*      EKG: My own interpretation - atrial fibrillation   Lupie Credit, DO 02/18/2024, 10:03 PM PGY-1, [facility]  FPTS Intern pager: [phone], text pages welcome Secure chat group [facility] Teaching Service

## 2024-02-18 NOTE — Hospital Course (Addendum)
 [name] is a 78 y.o. male with history of Type 2 diabetes, PAD, atrial fibrillation on AC, s/p ostomy reversal (prior Hartman's), history of MI, hypothyroidism, HLD, history of bladder cancer, coronary artery disease, CHF (LVEF 30-35%)  who was admitted for symptomatic anemia 2/2 suspected GI bleed.  GI bleed Previous admission in September for symptomatic anemia suspected to be GI bleed.  Had planned for outpatient colonoscopy/EGD 11/04. but returned with symptomatic anemia. Hemoglobin 7.2 with positive fecal occult on admission.  Started on IV Protonix , held Xarelto  but continued aspirin  given his significant PAD/vascular history.  GI consulted and performed an EGD which revealed three non-bleeding angiectasias in the stomach treated with argon plasma coagulation, with normal esophagus and duodenum.  Colonoscopy revealed red and coffee ground blood in distal ileum and ascending colon in the cecum, mild diverticulosis in the descending colon, and three 4-9 mm polyps in the ascending colon, one 6 mm polyp in the transverse colon, and one 6 mm polyp in the distal sigmoid colon, all of which were removed.  Octreotide  50 mcg BID was started inpatient, however it is not covered by his insurance.  GI discussed plan of action and referral to Dr. Charlean at [facility] after discharge for consideration of deep versus retrograde deep ostomy.  Prior to discharge patient was started on Eliquis 2.5 mg BID and his hemoglobin was monitored and stable at 8.1 prior to discharge.  AKI Creatinine of 1.98 on admission with unknown baseline, suspected to be prerenal in setting of blood loss anemia.  Caution with IV fluids given significant heart failure (LVEF of 30-35%). Cr continued to improved throughout admission.   MSSA infection of the left to right femoral bypass graft Graft infection on 08/25/2023, s/p washout with antibiotic beads.  Received 6-week course of IV Ancef , then transition to oral cefadroxil  on 10/11/2023.   Continued oral cefadroxil  1000 mg twice daily on admission.  Spoke with ID who recommended decreasing cefadroxil  to 500 mg twice daily.  ID to follow outpatient.  T2DM Poorly controlled type 2 diabetes, A1c greater than 10.  Held Jardiance  given elevated A1c.  Patient self discontinued glipizide , and nonadherent to home insulin  2/2 not feeling well.  Blood sugar was controlled with basal/sliding scale and his regimen at time of discharge was 20 units of Lantus , to be titrated outpatient back to his home regimen as able.  HTN Continued home amlodipine  and carvedilol  continued while inpatient.  However held Jardiance , Lasix , and isosorbide  dinitrate upon discharge due to low pressures, restart as indicated outpatient per PCP.  Other chronic conditions were medically managed with home medications and formulary alternatives as necessary (PAD/PVD, CAD, BPH)  PCP Follow-up recommendations: Ensure follow-up with ID outpatient ~03/18/24 Will need weekly CBC checks given hx of bleeds per GI. Please begin checks on 11/10 or 02/27/24.  Follow up Kidney function on 11/10 or 02/27/24.  Ensure IV iron infusions (should get 2 more, weekly, outpatient)

## 2024-02-18 NOTE — Assessment & Plan Note (Signed)
 104/88 on admission. - Continue home amlodipine  5mg  daily - Continue home carvedilol  as above

## 2024-02-19 DIAGNOSIS — D500 Iron deficiency anemia secondary to blood loss (chronic): Secondary | ICD-10-CM

## 2024-02-19 DIAGNOSIS — D62 Acute posthemorrhagic anemia: Secondary | ICD-10-CM

## 2024-02-19 DIAGNOSIS — B9561 Methicillin susceptible Staphylococcus aureus infection as the cause of diseases classified elsewhere: Secondary | ICD-10-CM | POA: Diagnosis not present

## 2024-02-19 DIAGNOSIS — K922 Gastrointestinal hemorrhage, unspecified: Secondary | ICD-10-CM | POA: Diagnosis not present

## 2024-02-19 DIAGNOSIS — Z7901 Long term (current) use of anticoagulants: Secondary | ICD-10-CM

## 2024-02-19 DIAGNOSIS — I5020 Unspecified systolic (congestive) heart failure: Secondary | ICD-10-CM | POA: Diagnosis not present

## 2024-02-19 DIAGNOSIS — L8990 Pressure ulcer of unspecified site, unspecified stage: Secondary | ICD-10-CM | POA: Insufficient documentation

## 2024-02-19 DIAGNOSIS — K625 Hemorrhage of anus and rectum: Secondary | ICD-10-CM | POA: Diagnosis not present

## 2024-02-19 DIAGNOSIS — T827XXS Infection and inflammatory reaction due to other cardiac and vascular devices, implants and grafts, sequela: Secondary | ICD-10-CM

## 2024-02-19 LAB — CBC
HCT: 23.1 % — ABNORMAL LOW (ref 39.0–52.0)
HCT: 27.3 % — ABNORMAL LOW (ref 39.0–52.0)
HCT: 26.6 % — ABNORMAL LOW (ref 39.0–52.0)
Hemoglobin: 7.1 g/dL — ABNORMAL LOW (ref 13.0–17.0)
Hemoglobin: 8.6 g/dL — ABNORMAL LOW (ref 13.0–17.0)
Hemoglobin: 8.2 g/dL — ABNORMAL LOW (ref 13.0–17.0)
MCH: 25.8 pg — ABNORMAL LOW (ref 26.0–34.0)
MCH: 26.2 pg (ref 26.0–34.0)
MCH: 26 pg (ref 26.0–34.0)
MCHC: 30.7 g/dL (ref 30.0–36.0)
MCHC: 31.5 g/dL (ref 30.0–36.0)
MCHC: 30.8 g/dL (ref 30.0–36.0)
MCV: 84 fL (ref 80.0–100.0)
MCV: 83.2 fL (ref 80.0–100.0)
MCV: 84.4 fL (ref 80.0–100.0)
Platelets: 247 K/uL (ref 150–400)
Platelets: 253 K/uL (ref 150–400)
Platelets: 228 K/uL (ref 150–400)
RBC: 2.75 MIL/uL — ABNORMAL LOW (ref 4.22–5.81)
RBC: 3.28 MIL/uL — ABNORMAL LOW (ref 4.22–5.81)
RBC: 3.15 MIL/uL — ABNORMAL LOW (ref 4.22–5.81)
RDW: 26 % — ABNORMAL HIGH (ref 11.5–15.5)
RDW: 24.2 % — ABNORMAL HIGH (ref 11.5–15.5)
RDW: 24.4 % — ABNORMAL HIGH (ref 11.5–15.5)
WBC: 6 K/uL (ref 4.0–10.5)
WBC: 5.6 K/uL (ref 4.0–10.5)
WBC: 5.8 K/uL (ref 4.0–10.5)
nRBC: 0 % (ref 0.0–0.2)
nRBC: 0 % (ref 0.0–0.2)
nRBC: 0 % (ref 0.0–0.2)

## 2024-02-19 LAB — GLUCOSE, CAPILLARY
Glucose-Capillary: 128 mg/dL — ABNORMAL HIGH (ref 70–99)
Glucose-Capillary: 158 mg/dL — ABNORMAL HIGH (ref 70–99)
Glucose-Capillary: 180 mg/dL — ABNORMAL HIGH (ref 70–99)
Glucose-Capillary: 211 mg/dL — ABNORMAL HIGH (ref 70–99)

## 2024-02-19 LAB — BASIC METABOLIC PANEL WITH GFR
Anion gap: 9 (ref 5–15)
BUN: 30 mg/dL — ABNORMAL HIGH (ref 8–23)
CO2: 23 mmol/L (ref 22–32)
Calcium: 8.2 mg/dL — ABNORMAL LOW (ref 8.9–10.3)
Chloride: 104 mmol/L (ref 98–111)
Creatinine, Ser: 1.78 mg/dL — ABNORMAL HIGH (ref 0.61–1.24)
GFR, Estimated: 38 mL/min — ABNORMAL LOW (ref >60)
Glucose, Bld: 184 mg/dL — ABNORMAL HIGH (ref 70–99)
Potassium: 4 mmol/L (ref 3.5–5.1)
Sodium: 136 mmol/L (ref 135–145)

## 2024-02-19 LAB — IRON AND TIBC
Iron: 39 ug/dL — ABNORMAL LOW (ref 45–182)
Saturation Ratios: 13 % — ABNORMAL LOW (ref 17.9–39.5)
TIBC: 291 ug/dL (ref 250–450)
UIBC: 252 ug/dL

## 2024-02-19 LAB — T4, FREE: Free T4: 0.77 ng/dL (ref 0.61–1.12)

## 2024-02-19 LAB — TSH: TSH: 3.726 u[IU]/mL (ref 0.350–4.500)

## 2024-02-19 LAB — FERRITIN: Ferritin: 66 ng/mL (ref 24–336)

## 2024-02-19 LAB — RETICULOCYTES
Immature Retic Fract: 39.7 % — ABNORMAL HIGH (ref 2.3–15.9)
RBC.: 3.49 MIL/uL — ABNORMAL LOW (ref 4.22–5.81)
Retic Count, Absolute: 162.6 K/uL (ref 19.0–186.0)
Retic Ct Pct: 4.7 % — ABNORMAL HIGH (ref 0.4–3.1)

## 2024-02-19 LAB — PREPARE RBC (CROSSMATCH)

## 2024-02-19 MED ORDER — SODIUM CHLORIDE 0.9 % IV SOLN
INTRAVENOUS | Status: AC
Start: 2024-02-19 — End: 2024-02-20

## 2024-02-19 MED ORDER — INSULIN ASPART 100 UNIT/ML IJ SOLN
0.0000 [IU] | Freq: Four times a day (QID) | INTRAMUSCULAR | Status: DC
  Administered 2024-02-19: 2 [IU] via SUBCUTANEOUS
  Administered 2024-02-19 – 2024-02-20 (×3): 1 [IU] via SUBCUTANEOUS
  Administered 2024-02-20: 3 [IU] via SUBCUTANEOUS
  Administered 2024-02-20 – 2024-02-21 (×2): 2 [IU] via SUBCUTANEOUS
  Administered 2024-02-21: 5 [IU] via SUBCUTANEOUS
  Administered 2024-02-21: 2 [IU] via SUBCUTANEOUS
  Administered 2024-02-21: 1 [IU] via SUBCUTANEOUS
  Administered 2024-02-22: 3 [IU] via SUBCUTANEOUS
  Administered 2024-02-22: 1 [IU] via SUBCUTANEOUS
  Administered 2024-02-22 – 2024-02-23 (×2): 2 [IU] via SUBCUTANEOUS
  Administered 2024-02-23: 3 [IU] via SUBCUTANEOUS
  Administered 2024-02-23: 2 [IU] via SUBCUTANEOUS
  Administered 2024-02-23: 3 [IU] via SUBCUTANEOUS
  Administered 2024-02-24: 2 [IU] via SUBCUTANEOUS
  Administered 2024-02-24: 3 [IU] via SUBCUTANEOUS
  Administered 2024-02-24 (×3): 2 [IU] via SUBCUTANEOUS
  Administered 2024-02-25: 1 [IU] via SUBCUTANEOUS
  Filled 2024-02-19: qty 3
  Filled 2024-02-19: qty 5
  Filled 2024-02-19: qty 3
  Filled 2024-02-19: qty 5
  Filled 2024-02-19: qty 2
  Filled 2024-02-19 (×2): qty 1
  Filled 2024-02-19: qty 5
  Filled 2024-02-19: qty 1
  Filled 2024-02-19: qty 2
  Filled 2024-02-19: qty 1
  Filled 2024-02-19: qty 3
  Filled 2024-02-19 (×5): qty 2

## 2024-02-19 MED ORDER — SIMETHICONE 80 MG PO CHEW
240.0000 mg | CHEWABLE_TABLET | Freq: Once | ORAL | Status: AC
  Administered 2024-02-19: 240 mg via ORAL
  Filled 2024-02-19: qty 3

## 2024-02-19 MED ORDER — PEG 3350-KCL-NA BICARB-NACL 420 G PO SOLR
2000.0000 mL | Freq: Once | ORAL | Status: AC
  Administered 2024-02-19: 2000 mL via ORAL

## 2024-02-19 MED ORDER — CEFADROXIL 500 MG PO CAPS
500.0000 mg | ORAL_CAPSULE | Freq: Two times a day (BID) | ORAL | Status: DC
  Administered 2024-02-19 – 2024-02-25 (×13): 500 mg via ORAL
  Filled 2024-02-19 (×13): qty 1

## 2024-02-19 MED ORDER — SODIUM CHLORIDE 0.9% IV SOLUTION: Freq: Once | INTRAVENOUS | Status: AC

## 2024-02-19 MED ORDER — PEG 3350-KCL-NA BICARB-NACL 420 G PO SOLR
2000.0000 mL | Freq: Once | ORAL | Status: AC
  Administered 2024-02-19: 2000 mL via ORAL
  Filled 2024-02-19: qty 4000

## 2024-02-19 MED ORDER — ONDANSETRON 4 MG PO TBDP
4.0000 mg | ORAL_TABLET | Freq: Once | ORAL | Status: AC
  Administered 2024-02-19: 4 mg via ORAL
  Filled 2024-02-19: qty 1

## 2024-02-19 MED ORDER — BISACODYL 5 MG PO TBEC
10.0000 mg | DELAYED_RELEASE_TABLET | Freq: Once | ORAL | Status: AC
  Administered 2024-02-19: 10 mg via ORAL
  Filled 2024-02-19: qty 2

## 2024-02-19 MED ORDER — SODIUM CHLORIDE 0.9% IV SOLUTION: Freq: Once | INTRAVENOUS | Status: DC

## 2024-02-19 NOTE — Progress Notes (Signed)
 Patient's blood transfusion completed with vitals checked. No adverse reactions observed from the blood transfusion, patient complained of nausea, MD made aware and PRN Zofran  given. No complaints of pain at this time. All needs met at this time, bed in lowest position with call light within reach.

## 2024-02-19 NOTE — Assessment & Plan Note (Signed)
-   Hold flomax , as patient not taking - Restart if retaining or LUTS sxs develop

## 2024-02-19 NOTE — Assessment & Plan Note (Addendum)
 Hgb 7.2 on admission. Anemia most likely in the setting of aspirin  use, Xarelto  use, sigmoidectomy with colostomy with takedown 06/2023. - Admit to medsurg under Dr. Donzetta  - Transfused 1u PRBC in ED, follow up H/H low, second transfusion 1u PRBC 0500, awaiting post transfusion h/h, Transfusion threshold 8.  - NPO after midnight, awaiting GI consult, change to fluids if procedure not planned for today  - Hold Xarelto , continue home ASA - IV Protonix  40mg  BID - GI consulted and to see in AM, appreciate recommendations - CBC q8 - AM BMP

## 2024-02-19 NOTE — Plan of Care (Signed)

## 2024-02-19 NOTE — Plan of Care (Signed)
  Problem: Coping: Goal: Ability to adjust to condition or change in health will improve Outcome: Progressing   Problem: Coping: Goal: Ability to adjust to condition or change in health will improve Outcome: Progressing   Problem: Fluid Volume: Goal: Ability to maintain a balanced intake and output will improve Outcome: Progressing   Problem: Nutritional: Goal: Maintenance of adequate nutrition will improve Outcome: Progressing   Problem: Clinical Measurements: Goal: Will remain free from infection Outcome: Progressing Goal: Diagnostic test results will improve Outcome: Progressing

## 2024-02-19 NOTE — Consult Note (Signed)
 [facility]  Total days of antibiotics 2       Reason for Consult:antibiotic recommendation for vascular graft infection   Referring Physician: pray  Principal Problem:   Acute GI bleeding Active Problems:   Hyperlipidemia   PAF (paroxysmal atrial fibrillation) (HCC)   AKI (acute kidney injury)   Hypothyroidism   Type 2 diabetes mellitus treated with insulin  (HCC)   HTN (hypertension)   Chronic HFrEF (heart failure with reduced ejection fraction) (HCC)   CKD stage 3b, GFR 30-44 ml/min (HCC)   BPH (benign prostatic hyperplasia)   MSSA (methicillin susceptible Staphylococcus aureus) infection   Anemia due to blood loss   Pressure injury of skin   GI bleed    HPI: [name] is a 78 y.o. male with history of MSSA vascular graft infection on chronic cefadroxil , now close to 6 month of treatment is being admitted for melena and anemia due to blood loss. He reports blood in stool over the last month. On admit, hgb 7. He was to see GI tomorrow for evaluation of symptoms. His family reports that he has been signficantly fatigued.  Past Medical History:  Diagnosis Date   Acute low back pain 01/12/2015   Acute lower limb ischemia 07/11/2023   Acute pain of both shoulders 07/22/2022   AKI (acute kidney injury) 05/24/2022   Arthritis    Atherosclerosis of native arteries of the extremities with intermittent claudication 08/13/2013   Atherosclerosis of native artery of extremity with intermittent claudication 08/13/2013   IMO SNOMED Dx Update Oct 2024     Backache 02/08/2013   Benign prostatic hyperplasia with urinary frequency 02/23/2023   Bladder cancer (HCC)    resection x3   BMI 27.0-27.9,adult 02/23/2023   Cellulitis 07/17/2023   Chronic combined systolic (congestive) and diastolic (congestive) heart failure (HCC) 07/22/2022   Chronic left shoulder pain 05/24/2023   Chronic right shoulder pain 09/02/2022   Chronic systolic CHF (congestive heart  failure) (HCC)    CKD stage 3b, GFR 30-44 ml/min (HCC) 07/03/2023   Coronary artery disease    status post DMI RX Taxus stent RCA 2006 with susequent Stent LAD and subsequent  stent thrombosis RCA unable to be opened 2006 -neg mv 10/2008, 10/16/17 ISR to pLAD with PTCA/DES, CTO of RCA with collaterals, EF 25%   Coronary artery disease involving native coronary artery of native heart with unstable angina pectoris (HCC)    Current moderate episode of major depressive disorder without prior episode (HCC) 03/28/2022   Diverticulitis 06/10/2022   Diverticulitis large intestine 05/11/2021   Diverticulitis of large intestine with abscess 05/24/2022   Diverticulitis of sigmoid colon 05/11/2021   DM2 (diabetes mellitus, type 2) (HCC) 02/09/2022   status post bilateral aortobifemoral bypass [id number] with recent fem to fembypass April  2011 per DR. Early     Dyssynergic defecation 03/21/2023   Essential hypertension 07/22/2022   Fatigue 03/13/2023   History of bladder cancer 02/23/2023   History of colonic diverticulitis 03/21/2023   Hypercholesterolemia 10/28/2022   HYPERLIPIDEMIA-MIXED 09/22/2008   Qualifier: Diagnosis of  By: Jaramillo, Luz     Hypothyroidism 05/24/2022   Ischemic cardiomyopathy    ejection fraction of 40-45%   Lethargy 03/28/2022   Lumbar spinal stenosis    Myocardial infarction (HCC)    I've had 4 (10/16/2017)   Need for prophylactic vaccination and inoculation against influenza 03/28/2022   NSVT (nonsustained ventricular tachycardia) (HCC)    PAD (peripheral artery disease) 07/17/2023  PAF (paroxysmal atrial fibrillation) (HCC) 02/09/2022   Paroxysmal atrial fibrillation (HCC)    Peripheral arterial disease    Peripheral artery disease 07/09/2023   Poorly controlled T2 diabetes mellitus (HCC) 09/27/2022   Protein-calorie malnutrition, severe 05/29/2022   PVC's (premature ventricular contractions)    PVD 09/22/2008   Qualifier: Diagnosis of  By: Justina Kos     PVD  (peripheral vascular disease) 02/23/2023   Rotator cuff arthropathy of right shoulder 09/27/2022   Sigmoid diverticulitis 06/30/2023   Status post coronary artery stent placement    Status post Hartmann's procedure (HCC) 03/21/2023   TOBACCO ABUSE 05/29/2009   Qualifier: Diagnosis of  By: Velinda, RN, BSN, Avelina CROME    Trigger ring finger of right hand 03/29/2023   Type 2 diabetes mellitus with other circulatory complications (HCC) 07/22/2022   Type II diabetes mellitus (HCC)    Unstable angina (HCC) 10/16/2017    Allergies:  Allergies  Allergen Reactions   Zestril  [Lisinopril ] Swelling and Rash    Facial and tongue swelling   Levaquin [Levofloxacin] Other (See Comments)    Unknown reaction    Current antibiotics:   MEDICATIONS:  amLODipine   5 mg Oral Daily   carvedilol   12.5 mg Oral BID WC   cefadroxil   500 mg Oral BID   ezetimibe   10 mg Oral Daily   insulin  aspart  0-9 Units Subcutaneous Q6H   insulin  glargine-yfgn  20 Units Subcutaneous QHS   pantoprazole  (PROTONIX ) IV  40 mg Intravenous Q12H   polyethylene glycol-electrolytes  2,000 mL Oral Once   Followed by   polyethylene glycol-electrolytes  2,000 mL Oral Once   rosuvastatin   40 mg Oral Daily   simethicone   240 mg Oral Once   Followed by   simethicone   240 mg Oral Once    Social History   Tobacco Use   Smoking status: Former    Current packs/day: 0.00    Average packs/day: 0.5 packs/day for 56.0 years (28.0 ttl pk-yrs)    Types: Cigars, Cigarettes    Start date: 1966    Quit date: 2022    Years since quitting: 3.8   Smokeless tobacco: Former    Types: Associate Professor status: Never Used  Substance Use Topics   Alcohol use: Yes    Comment: Rarely 1 beer   Drug use: Never    Family History  Problem Relation Age of Onset   Coronary artery disease Unknown    Diabetes Unknown    Cardiomyopathy Mother    Heart disease Mother    Hyperlipidemia Mother    Coronary artery disease Father     Stroke Father    Diabetes Father    Heart disease Father        before age 26   Hyperlipidemia Father    Heart attack Father    Diabetes Sister    Heart disease Sister        before age 70   Hyperlipidemia Sister    Heart attack Sister     Review of Systems - 12 point ros is negative except what is mentioned above   OBJECTIVE: Temp:  [98 F (36.7 C)-98.7 F (37.1 C)] 98.2 F (36.8 C) (11/03 0911) Pulse Rate:  [81-95] 89 (11/03 1013) Resp:  [16-18] 17 (11/03 0911) BP: (104-157)/(53-88) 124/63 (11/03 1013) SpO2:  [95 %-100 %] 96 % (11/03 0911) Physical Exam  Constitutional: He is oriented to person, place, and time. He appears well-developed and well-nourished. No distress.  HENT:  Mouth/Throat: Oropharynx is clear and moist. No oropharyngeal exudate.  Cardiovascular: Normal rate, regular rhythm and normal heart sounds. Exam reveals no gallop and no friction rub.  No murmur heard.  Pulmonary/Chest: Effort normal and breath sounds normal. No respiratory distress. He has no wheezes.  Abdominal: Soft. Bowel sounds are normal. He exhibits no distension. There is no tenderness.  Lymphadenopathy:  He has no cervical adenopathy.  Neurological: He is alert and oriented to person, place, and time.  Skin: Skin is warm and dry. No rash noted. No erythema.  Psychiatric: He has a normal mood and affect. His behavior is normal.    LABS: Results for orders placed or performed during the hospital encounter of 02/18/24 (from the past 48 hours)  CBG monitoring, ED     Status: Abnormal   Collection Time: 02/18/24  4:18 PM  Result Value Ref Range   Glucose-Capillary 305 (H) 70 - 99 mg/dL    Comment: Glucose reference range applies only to samples taken after fasting for at least 8 hours.   Comment 1 Notify RN    Comment 2 Document in Chart   CBC     Status: Abnormal   Collection Time: 02/18/24  4:34 PM  Result Value Ref Range   WBC 5.8 4.0 - 10.5 K/uL   RBC 2.88 (L) 4.22 - 5.81 MIL/uL    Hemoglobin 7.2 (L) 13.0 - 17.0 g/dL   HCT 75.7 (L) 60.9 - 47.9 %   MCV 84.0 80.0 - 100.0 fL   MCH 25.0 (L) 26.0 - 34.0 pg   MCHC 29.8 (L) 30.0 - 36.0 g/dL   RDW 72.7 (H) 88.4 - 84.4 %   Platelets 273 150 - 400 K/uL   nRBC 0.0 0.0 - 0.2 %    Comment: Performed at [facility], [address] [place], [place] [address]  Type and screen [facility]     Status: None (Preliminary result)   Collection Time: 02/18/24  4:34 PM  Result Value Ref Range   ABO/RH(D) O NEG    Antibody Screen NEG    Sample Expiration 02/21/2024,2359    Unit Number [id number]    Blood Component Type RBC LR PHER2    Unit division A0    Status of Unit ISSUED,FINAL    Transfusion Status OK TO TRANSFUSE    Crossmatch Result      Compatible Performed at [facility], [address] [place], [place] [address]    Unit Number [id number]    Blood Component Type RED CELLS,LR    Unit division 00    Status of Unit ISSUED    Transfusion Status OK TO TRANSFUSE    Crossmatch Result Compatible   Protime-INR - (order if Patient is taking Coumadin / Warfarin)     Status: Abnormal   Collection Time: 02/18/24  4:34 PM  Result Value Ref Range   Prothrombin Time 21.8 (H) 11.4 - 15.2 seconds   INR 1.8 (H) 0.8 - 1.2    Comment: (NOTE) INR goal varies based on device and disease states. Performed at [facility], [address] [place], [place] [address]   POC occult blood, ED     Status: Abnormal   Collection Time: 02/18/24  4:42 PM  Result Value Ref Range   Fecal Occult Bld POSITIVE (A) NEGATIVE  Comprehensive metabolic panel with GFR     Status: Abnormal   Collection Time: 02/18/24  4:58 PM  Result Value Ref  Range   Sodium 135 135 - 145 mmol/L   Potassium 3.9 3.5 - 5.1 mmol/L   Chloride 101 98 - 111 mmol/L   CO2 23 22 - 32 mmol/L   Glucose, Bld 291 (H) 70 - 99 mg/dL    Comment: Glucose reference range applies only to samples taken after fasting for at least 8 hours.    BUN 32 (H) 8 - 23 mg/dL   Creatinine, Ser 8.01 (H) 0.61 - 1.24 mg/dL   Calcium  8.5 (L) 8.9 - 10.3 mg/dL   Total Protein 5.7 (L) 6.5 - 8.1 g/dL   Albumin  3.1 (L) 3.5 - 5.0 g/dL   AST 15 15 - 41 U/L   ALT 11 0 - 44 U/L   Alkaline Phosphatase 50 38 - 126 U/L   Total Bilirubin 0.6 0.0 - 1.2 mg/dL   GFR, Estimated 34 (L) >60 mL/min    Comment: (NOTE) Calculated using the CKD-EPI Creatinine Equation (2021)    Anion gap 11 5 - 15    Comment: Performed at [facility], [address] [place], [place] [address]  Prepare RBC (crossmatch)     Status: None   Collection Time: 02/18/24  6:21 PM  Result Value Ref Range   Order Confirmation      ORDER PROCESSED BY BLOOD BANK Performed at [facility], [address], [place], [place] [address]   Glucose, capillary     Status: Abnormal   Collection Time: 02/18/24 10:31 PM  Result Value Ref Range   Glucose-Capillary 165 (H) 70 - 99 mg/dL    Comment: Glucose reference range applies only to samples taken after fasting for at least 8 hours.  CBC     Status: Abnormal   Collection Time: 02/19/24  3:56 AM  Result Value Ref Range   WBC 6.0 4.0 - 10.5 K/uL   RBC 2.75 (L) 4.22 - 5.81 MIL/uL   Hemoglobin 7.1 (L) 13.0 - 17.0 g/dL   HCT 76.8 (L) 60.9 - 47.9 %   MCV 84.0 80.0 - 100.0 fL   MCH 25.8 (L) 26.0 - 34.0 pg   MCHC 30.7 30.0 - 36.0 g/dL   RDW 73.9 (H) 88.4 - 84.4 %   Platelets 247 150 - 400 K/uL   nRBC 0.0 0.0 - 0.2 %    Comment: Performed at [facility], [address], [place], [place] [address]  Basic metabolic panel     Status: Abnormal   Collection Time: 02/19/24  3:56 AM  Result Value Ref Range   Sodium 136 135 - 145 mmol/L   Potassium 4.0 3.5 - 5.1 mmol/L   Chloride 104 98 - 111 mmol/L   CO2 23 22 - 32 mmol/L   Glucose, Bld 184 (H) 70 - 99 mg/dL    Comment: Glucose reference range applies only to samples taken after fasting for at least 8 hours.   BUN 30 (H) 8 - 23 mg/dL   Creatinine, Ser 8.21 (H) 0.61 - 1.24  mg/dL   Calcium  8.2 (L) 8.9 - 10.3 mg/dL   GFR, Estimated 38 (L) >60 mL/min    Comment: (NOTE) Calculated using the CKD-EPI Creatinine Equation (2021)    Anion gap 9 5 - 15    Comment: Performed at [facility], [address], [place], [place] [address]  TSH     Status: None   Collection Time: 02/19/24  3:56 AM  Result Value Ref Range   TSH 3.726 0.350 - 4.500  uIU/mL    Comment: Performed by a 3rd Generation assay with a functional sensitivity of <=0.01 uIU/mL. Performed at [facility], [address], [place], [place] [address]   T4, free     Status: None   Collection Time: 02/19/24  3:56 AM  Result Value Ref Range   Free T4 0.77 0.61 - 1.12 ng/dL    Comment: (NOTE) Biotin ingestion may interfere with free T4 tests. If the results are inconsistent with the TSH level, previous test results, or the clinical presentation, then consider biotin interference. If needed, order repeat testing after stopping biotin. Performed at [facility], [address], [place], [place] [address]   Glucose, capillary     Status: Abnormal   Collection Time: 02/19/24  5:04 AM  Result Value Ref Range   Glucose-Capillary 180 (H) 70 - 99 mg/dL    Comment: Glucose reference range applies only to samples taken after fasting for at least 8 hours.  Prepare RBC (crossmatch)     Status: None   Collection Time: 02/19/24  5:20 AM  Result Value Ref Range   Order Confirmation      ORDER PROCESSED BY BLOOD BANK Performed at [facility], [address], [place], [place] [address]   Glucose, capillary     Status: Abnormal   Collection Time: 02/19/24  7:50 AM  Result Value Ref Range   Glucose-Capillary 158 (H) 70 - 99 mg/dL    Comment: Glucose reference range applies only to samples taken after fasting for at least 8 hours.  Glucose, capillary     Status: Abnormal   Collection Time: 02/19/24 11:54 AM  Result Value Ref Range   Glucose-Capillary 128 (H) 70 - 99 mg/dL    Comment:  Glucose reference range applies only to samples taken after fasting for at least 8 hours.  CBC     Status: Abnormal   Collection Time: 02/19/24 12:29 PM  Result Value Ref Range   WBC 5.6 4.0 - 10.5 K/uL   RBC 3.28 (L) 4.22 - 5.81 MIL/uL   Hemoglobin 8.6 (L) 13.0 - 17.0 g/dL   HCT 72.6 (L) 60.9 - 47.9 %   MCV 83.2 80.0 - 100.0 fL   MCH 26.2 26.0 - 34.0 pg   MCHC 31.5 30.0 - 36.0 g/dL   RDW 75.7 (H) 88.4 - 84.4 %   Platelets 253 150 - 400 K/uL   nRBC 0.0 0.0 - 0.2 %    Comment: Performed at [facility], [address], [place], [place] [address]  Iron and TIBC     Status: Abnormal   Collection Time: 02/19/24  2:05 PM  Result Value Ref Range   Iron 39 (L) 45 - 182 ug/dL   TIBC 708 749 - 549 ug/dL   Saturation Ratios 13 (L) 17.9 - 39.5 %   UIBC 252 ug/dL    Comment: Performed at [facility], [address], [place], [place] [address]  Ferritin     Status: None   Collection Time: 02/19/24  2:05 PM  Result Value Ref Range   Ferritin 66 24 - 336 ng/mL    Comment: Performed at [facility], [address] [place], [place] [address]  Reticulocytes     Status: Abnormal   Collection Time: 02/19/24  2:05 PM  Result Value Ref Range   Retic Ct Pct 4.7 (H) 0.4 - 3.1 %   RBC. 3.49 (L) 4.22 - 5.81 MIL/uL  Retic Count, Absolute 162.6 19.0 - 186.0 K/uL   Immature Retic Fract 39.7 (H) 2.3 - 15.9 %    Comment: Performed at [facility], [address], [place], [place] [address]    MICRO: reviewed IMAGING: No results found.  HISTORICAL MICRO/IMAGING 5/10 aerobic cx MSSA Assessment/Plan:  78yo M with hx of MSSA vascular graft infection, currently on cefadroxil ,also has afib, and hx of diverticulosis s/p resection and takedown of colostomy in early march now admitted for intermittent rectal bleeding with associted anemia. - from ID standpoint, recommend to still continue on cefadroxil , can decrease dose to 500mg  po bid, which is anticipated to take for prolonged  period of time (months-year) - anticipate he will need EGD +/- other evaluation by GI during admission - anemia- transfuse to goal per primary team  Discussed abtx changes with family and will see them in clinic in 4 wk  I personally spent a total of 52 minutes in the care of the patient today including preparing to see the patient, getting/reviewing separately obtained history, performing a medically appropriate exam/evaluation, counseling and educating, placing orders, documenting clinical information in the EHR, independently interpreting results, and communicating results.

## 2024-02-19 NOTE — H&P (View-Only) (Signed)
 Consultation  Referring Provider:  [facility] / [facility] Primary Care Physician:  Fleeta Finger, Selinda, MD Primary Gastroenterologist:  Dr.Misenheimer/ Pierce  Reason for Consultation: Recurrent anemia, rectal bleeding  HPI: [name] is a 78 y.o. male with history of atrial fibrillation, diabetes mellitus, chronic kidney disease stage IIIb, hypertension, bladder CA, peripheral vascular disease and congestive heart failure with reduced EF, with most recent EF at 30 to 35%.  Patient has been on Xarelto  and aspirin  at home. Patient was brought to the emergency room by family last evening due to concerns with progressive fatigue and lethargy in setting of noting blood in stools very frequently over the past couple of months.  The bleeding seems to have worsened with more dark and purplish appearing stools over the past week.  Patient is known to Dr. Larene in [place] and per family was scheduled to have colonoscopy and EGD on 02/20/2024. He had an episode of complicated diverticulitis in February 2024 and required diverting colostomy.  He underwent sigmoidectomy and colostomy takedown in March 2025 per Dr. Sheldon . He had undergone preoperative colonoscopy in February 2025 per Dr. Larene.  We do not have that report available but per his family they were told that everything looked good, no polyps removed.  Patient had admission here in September 2025 with symptomatic anemia, and required transfusion.  Per the notes he had also had a low hemoglobin in May 2025 when he was hospitalized with a lower extremity graft infection for which he has been on chronic antibiotics.  Labs in the ER 02/18/2024-WBC 5.8/hemoglobin 7.2/macro 24.2/MCV 84/platelets 273  Hemoglobin had been documented at 10.2 on 01/10/2024  Pro time 21.8/INR 1.8 Stool Hemoccult positive Sodium 135/potassium 3.9/BUN 32/creatinine 1.98/glucose 291 LFTs within normal limits  He has had 2 units of packed RBCs since  admission-with most recent hemoglobin 8.6/hematocrit 27.3.  No iron studies done  Patient denies any abdominal pain or cramping, no real changes in bowel habits other than noticing the blood with most of his bowel movements per family.  He had been placed on a PPI/Protonix  about a month ago per PCP for complaints of indigestion.  Appetite has been okay, no nausea or vomiting at home.  Patient has not had prior EGD, EGD had been planned by Dr. Larene to better understand his recurrent anemia and bleeding.      Past Medical History:  Diagnosis Date   Acute low back pain 01/12/2015   Acute lower limb ischemia 07/11/2023   Acute pain of both shoulders 07/22/2022   AKI (acute kidney injury) 05/24/2022   Arthritis    Atherosclerosis of native arteries of the extremities with intermittent claudication 08/13/2013   Atherosclerosis of native artery of extremity with intermittent claudication 08/13/2013   IMO SNOMED Dx Update Oct 2024     Backache 02/08/2013   Benign prostatic hyperplasia with urinary frequency 02/23/2023   Bladder cancer (HCC)    resection x3   BMI 27.0-27.9,adult 02/23/2023   Cellulitis 07/17/2023   Chronic combined systolic (congestive) and diastolic (congestive) heart failure (HCC) 07/22/2022   Chronic left shoulder pain 05/24/2023   Chronic right shoulder pain 09/02/2022   Chronic systolic CHF (congestive heart failure) (HCC)    CKD stage 3b, GFR 30-44 ml/min (HCC) 07/03/2023   Coronary artery disease    status post DMI RX Taxus stent RCA 2006 with susequent Stent LAD and subsequent  stent thrombosis RCA unable to be opened 2006 -neg mv 10/2008, 10/16/17 ISR to pLAD  with PTCA/DES, CTO of RCA with collaterals, EF 25%   Coronary artery disease involving native coronary artery of native heart with unstable angina pectoris (HCC)    Current moderate episode of major depressive disorder without prior episode (HCC) 03/28/2022   Diverticulitis 06/10/2022   Diverticulitis  large intestine 05/11/2021   Diverticulitis of large intestine with abscess 05/24/2022   Diverticulitis of sigmoid colon 05/11/2021   DM2 (diabetes mellitus, type 2) (HCC) 02/09/2022   status post bilateral aortobifemoral bypass [id number] with recent fem to fembypass April  2011 per DR. Early     Dyssynergic defecation 03/21/2023   Essential hypertension 07/22/2022   Fatigue 03/13/2023   History of bladder cancer 02/23/2023   History of colonic diverticulitis 03/21/2023   Hypercholesterolemia 10/28/2022   HYPERLIPIDEMIA-MIXED 09/22/2008   Qualifier: Diagnosis of  By: Justina Kos     Hypothyroidism 05/24/2022   Ischemic cardiomyopathy    ejection fraction of 40-45%   Lethargy 03/28/2022   Lumbar spinal stenosis    Myocardial infarction (HCC)    I've had 4 (10/16/2017)   Need for prophylactic vaccination and inoculation against influenza 03/28/2022   NSVT (nonsustained ventricular tachycardia) (HCC)    PAD (peripheral artery disease) 07/17/2023   PAF (paroxysmal atrial fibrillation) (HCC) 02/09/2022   Paroxysmal atrial fibrillation (HCC)    Peripheral arterial disease    Peripheral artery disease 07/09/2023   Poorly controlled T2 diabetes mellitus (HCC) 09/27/2022   Protein-calorie malnutrition, severe 05/29/2022   PVC's (premature ventricular contractions)    PVD 09/22/2008   Qualifier: Diagnosis of  By: Justina Kos     PVD (peripheral vascular disease) 02/23/2023   Rotator cuff arthropathy of right shoulder 09/27/2022   Sigmoid diverticulitis 06/30/2023   Status post coronary artery stent placement    Status post Hartmann's procedure (HCC) 03/21/2023   TOBACCO ABUSE 05/29/2009   Qualifier: Diagnosis of  By: Velinda, RN, BSN, Avelina CROME    Trigger ring finger of right hand 03/29/2023   Type 2 diabetes mellitus with other circulatory complications (HCC) 07/22/2022   Type II diabetes mellitus (HCC)    Unstable angina (HCC) 10/16/2017    Past Surgical History:  Procedure  Laterality Date   ABDOMINAL AORTOGRAM W/LOWER EXTREMITY Right 07/10/2023   Procedure: ABDOMINAL AORTOGRAM W/LOWER EXTREMITY;  Surgeon: Pearline Norman RAMAN, MD;  Location: [facility] INVASIVE CV LAB;  Service: Cardiovascular;  Laterality: Right;   AORTA - BILATERAL FEMORAL ARTERY BYPASS GRAFT Bilateral 1992   APPENDECTOMY     APPLICATION OF WOUND VAC Left 08/26/2023   Procedure: APPLICATION, WOUND VAC left groin;  Surgeon: Sheree Penne Bruckner, MD;  Location: [facility] OR;  Service: Vascular;  Laterality: Left;   APPLICATION, SKIN SUBSTITUTE Left 08/29/2023   Procedure: APPLICATION, SKIN SUBSTITUTE;  Surgeon: Sheree Penne Bruckner, MD;  Location: [facility] OR;  Service: Vascular;  Laterality: Left;   BACK SURGERY     COLECTOMY WITH COLOSTOMY CREATION/HARTMANN PROCEDURE N/A 05/26/2022   Procedure: COLECTOMY WITH COLOSTOMY CREATION/HARTMANN PROCEDURE;  Surgeon: Paola Dreama SAILOR, MD;  Location: [facility] OR;  Service: General;  Laterality: N/A;   CORONARY ANGIOPLASTY WITH STENT PLACEMENT     taxus stent  placed into his right coronary artery; stent placed to the LAD.     CORONARY STENT INTERVENTION N/A 10/16/2017   Procedure: CORONARY STENT INTERVENTION;  Surgeon: Verlin Bruckner BIRCH, MD;  Location: [facility] INVASIVE CV LAB;  Service: Cardiovascular;  Laterality: N/A;   FEMORAL-FEMORAL BYPASS GRAFT  07/2009   INCISION AND DRAINAGE OF WOUND Left 08/26/2023   Procedure:  IRRIGATION AND DEBRIDEMENT left groin  WOUND, application of antibiotic beads;  Surgeon: Sheree Penne Bruckner, MD;  Location: [facility] OR;  Service: Vascular;  Laterality: Left;  WASHOUT LEFT GROIN   INCISION AND DRAINAGE OF WOUND Left 08/29/2023   Procedure: IRRIGATION AND DEBRIDEMENT WOUND;  Surgeon: Sheree Penne Bruckner, MD;  Location: [facility] OR;  Service: Vascular;  Laterality: Left;  WASHOUT LEFT GROIN REPEAT   LAPAROTOMY N/A 05/26/2022   Procedure: EXPLORATORY LAPAROTOMY;  Surgeon: Paola Dreama SAILOR, MD;  Location: [facility] OR;  Service: General;  Laterality: N/A;    LOWER EXTREMITY INTERVENTION Right 07/10/2023   Procedure: LOWER EXTREMITY INTERVENTION;  Surgeon: Pearline Norman RAMAN, MD;  Location: [facility] INVASIVE CV LAB;  Service: Cardiovascular;  Laterality: Right;   LUMBAR MICRODISCECTOMY  12/31/08   LYSIS OF ADHESION N/A 06/30/2023   Procedure: LAPAROSCOPIC LYSIS OF ADHESIONS;  Surgeon: Sheldon Standing, MD;  Location: [facility] ORS;  Service: General;  Laterality: N/A;   PATCH ANGIOPLASTY Left 08/26/2023   Procedure: Primary repair of left femoral graftotomy;  Surgeon: Sheree Penne Bruckner, MD;  Location: [facility] OR;  Service: Vascular;  Laterality: Left;   PERIPHERAL VASCULAR THROMBECTOMY Right 07/10/2023   Procedure: PERIPHERAL VASCULAR THROMBECTOMY;  Surgeon: Pearline Norman RAMAN, MD;  Location: [facility] INVASIVE CV LAB;  Service: Cardiovascular;  Laterality: Right;   PROCTOSCOPY N/A 06/30/2023   Procedure: Flexible sigmoidoscopy;  Surgeon: Sheldon Standing, MD;  Location: [facility] ORS;  Service: General;  Laterality: N/A;   RIGHT/LEFT HEART CATH AND CORONARY ANGIOGRAPHY N/A 10/16/2017   Procedure: RIGHT/LEFT HEART CATH AND CORONARY ANGIOGRAPHY;  Surgeon: Verlin Bruckner BIRCH, MD;  Location: [facility] INVASIVE CV LAB;  Service: Cardiovascular;  Laterality: N/A;   SHOULDER OPEN ROTATOR CUFF REPAIR Left    VACUUM ASSISTED CLOSURE CHANGE Left 08/29/2023   Procedure: REPLACEMENT, WOUND VAC DRESSING, GROIN;  Surgeon: Sheree Penne Bruckner, MD;  Location: [facility] OR;  Service: Vascular;  Laterality: Left;   XI ROBOTIC ASSISTED COLOSTOMY TAKEDOWN N/A 06/30/2023   Procedure: XI ROBOTIC ASSISTED OSTOMY TAKEDOWN, PARTIAL RECTOSIGMOID RESECTION, BILATERAL TAP BLOCK;  Surgeon: Sheldon Standing, MD;  Location: [facility] ORS;  Service: General;  Laterality: N/A;    Prior to Admission medications   Medication Sig Start Date End Date Taking? Authorizing Provider  acetaminophen  (TYLENOL ) 500 MG tablet Take 1,000 mg by mouth every 6 (six) hours as needed for headache or fever (pain).   Yes [provider]   amLODipine  (NORVASC ) 5 MG tablet TAKE 1 TABLET(5 MG) BY MOUTH DAILY 11/13/23  Yes Krasowski, Robert J, MD  aspirin  EC 81 MG tablet Take 1 tablet (81 mg total) by mouth daily. Swallow whole. 05/10/21  Yes Verlin Bruckner BIRCH, MD  carvedilol  (COREG ) 12.5 MG tablet Take 1 tablet (12.5 mg total) by mouth 2 (two) times daily with a meal. 01/24/23  Yes Bernie Lamar PARAS, MD  cefadroxil  (DURICEF) 500 MG capsule Take 2 capsules (1,000 mg total) by mouth 2 (two) times daily. 10/05/23  Yes Dennise Kingsley, MD  empagliflozin  (JARDIANCE ) 10 MG TABS tablet Take 1 tablet (10 mg total) by mouth daily. 01/04/24  Yes Shamleffer, Ibtehal Jaralla, MD  ezetimibe  (ZETIA ) 10 MG tablet Take 1 tablet (10 mg total) by mouth daily. 03/01/23 02/24/24 Yes Krasowski, Robert J, MD  furosemide  (LASIX ) 20 MG tablet Take 1 tablet (20 mg total) by mouth every morning. Patient taking differently: Take 20 mg by mouth every Monday, Wednesday, and Friday. 12/27/23 12/26/24 Yes Fleeta Valeria Mayo, MD  insulin  glargine (LANTUS ) 100 unit/mL SOPN Inject 40 Units into the  skin daily. 46 units subcut daily at nighttime Patient taking differently: Inject 40 Units into the skin at bedtime. 01/04/24  Yes Shamleffer, Ibtehal Jaralla, MD  Insulin  Pen Needle 32G X 4 MM MISC 1 Device by Does not apply route daily in the afternoon. 01/04/24  Yes Shamleffer, Ibtehal Jaralla, MD  nitroGLYCERIN  (NITROSTAT ) 0.4 MG SL tablet Place 0.4 mg under the tongue every 5 (five) minutes x 3 doses as needed for chest pain.   Yes [provider]  pantoprazole  (PROTONIX ) 40 MG tablet Take 40 mg by mouth 2 (two) times daily.   Yes [provider]  rivaroxaban  (XARELTO ) 20 MG TABS tablet Take 1 tablet (20 mg total) by mouth daily with supper. Patient taking differently: Take 20 mg by mouth at bedtime. 12/23/22  Yes Fleeta Valeria Mayo, MD  rosuvastatin  (CRESTOR ) 40 MG tablet TAKE 1 TABLET(40 MG) BY MOUTH DAILY 02/05/24  Yes Fleeta Valeria Mayo, MD  tirzepatide  (MOUNJARO )  5 MG/0.5ML Pen Inject 5 mg into the skin once a week. Patient taking differently: Inject 5 mg into the skin every Sunday. 01/04/24  Yes Shamleffer, Ibtehal Jaralla, MD  glipiZIDE  (GLUCOTROL ) 5 MG tablet Take 1 tablet (5 mg total) by mouth 2 (two) times daily before a meal. Patient not taking: Reported on 02/19/2024 01/04/24   Shamleffer, Ibtehal Jaralla, MD  isosorbide  dinitrate (ISORDIL ) 10 MG tablet Take 1 tablet (10 mg total) by mouth 2 (two) times daily. Patient not taking: Reported on 02/19/2024 09/15/23   Carlin Delon BROCKS, NP    Current Facility-Administered Medications  Medication Dose Route Frequency Provider Last Rate Last Admin   0.9 %  sodium chloride  infusion   Intravenous Continuous Moris Ratchford S, PA-C       acetaminophen  (TYLENOL ) tablet 650 mg  650 mg Oral Q6H PRN Lupie Credit, DO       Or   acetaminophen  (TYLENOL ) suppository 650 mg  650 mg Rectal Q6H PRN Majeed, Credit, DO       amLODipine  (NORVASC ) tablet 5 mg  5 mg Oral Daily Majeed, Sara, DO   5 mg at 02/19/24 1014   bisacodyl  (DULCOLAX) EC tablet 10 mg  10 mg Oral Once Exie Chrismer S, PA-C       carvedilol  (COREG ) tablet 12.5 mg  12.5 mg Oral BID WC Majeed, Sara, DO   12.5 mg at 02/19/24 1013   cefadroxil  (DURICEF) capsule 500 mg  500 mg Oral BID Luiz Channel, MD   500 mg at 02/19/24 1015   ezetimibe  (ZETIA ) tablet 10 mg  10 mg Oral Daily Lupie Credit, DO   10 mg at 02/19/24 1013   insulin  aspart (novoLOG ) injection 0-9 Units  0-9 Units Subcutaneous Q6H Elodie Palma, MD   1 Units at 02/19/24 1237   insulin  glargine-yfgn (SEMGLEE ) injection 20 Units  20 Units Subcutaneous QHS Lupie Credit, DO   20 Units at 02/18/24 2250   pantoprazole  (PROTONIX ) injection 40 mg  40 mg Intravenous Q12H Lupie Credit, DO   40 mg at 02/19/24 1015   polyethylene glycol-electrolytes (NuLYTELY ) solution 2,000 mL  2,000 mL Oral Once Haydyn Girvan S, PA-C       Followed by   polyethylene glycol-electrolytes (NuLYTELY ) solution 2,000 mL  2,000  mL Oral Once Denyla Cortese S, PA-C       rosuvastatin  (CRESTOR ) tablet 40 mg  40 mg Oral Daily Majeed, Sara, DO   40 mg at 02/19/24 1013   simethicone  (MYLICON) chewable tablet 240 mg  240 mg Oral Once Toma Erichsen,  Matthias Bogus S, PA-C       Followed by   simethicone  (MYLICON) chewable tablet 240 mg  240 mg Oral Once Alexcis Bicking S, PA-C        Allergies as of 02/18/2024 - Review Complete 02/18/2024  Allergen Reaction Noted   Lisinopril  Swelling and Rash 12/14/2011   Levaquin [levofloxacin]  08/26/2023    Family History  Problem Relation Age of Onset   Coronary artery disease Unknown    Diabetes Unknown    Cardiomyopathy Mother    Heart disease Mother    Hyperlipidemia Mother    Coronary artery disease Father    Stroke Father    Diabetes Father    Heart disease Father        before age 70   Hyperlipidemia Father    Heart attack Father    Diabetes Sister    Heart disease Sister        before age 46   Hyperlipidemia Sister    Heart attack Sister     Social History   Socioeconomic History   Marital status: Divorced    Spouse name: Not on file   Number of children: 1   Years of education: Not on file   Highest education level: Not on file  Occupational History   Occupation: retired    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     Social Drivers of Corporate Investment Banker Strain: Not on file  Food Insecurity: No Food  Insecurity (02/18/2024)   Hunger Vital Sign    Worried About Running Out of Food in the Last Year: Never true    Ran Out of Food in the Last Year: Never true  Transportation Needs: No Transportation Needs (02/18/2024)   PRAPARE - Administrator, Civil Service (Medical): No    Lack of Transportation (Non-Medical): No  Physical Activity: Not on file  Stress: Not on file  Social Connections: Unknown (02/18/2024)   Social Connection and Isolation Panel    Frequency of Communication with Friends and Family: More than three times a week    Frequency of Social Gatherings with Friends and Family: More than three times a week    Attends Religious Services: More than 4 times per year    Active Member of [employer] or Organizations: Yes    Attends Banker Meetings: 1 to 4 times per year    Marital Status: Patient unable to answer  Intimate Partner Violence: Not At Risk (02/18/2024)   Humiliation, Afraid, Rape, and Kick questionnaire    Fear of Current or Ex-Partner: No    Emotionally Abused: No    Physically Abused: No    Sexually Abused: No    Review of Systems: Pertinent positive and negative review of systems were noted in the  above HPI section.  All other review of systems was otherwise negative.   Physical Exam: Vital signs in last 24 hours: Temp:  [98 F (36.7 C)-98.7 F (37.1 C)] 98.2 F (36.8 C) (11/03 0911) Pulse Rate:  [81-103] 89 (11/03 1013) Resp:  [16-18] 17 (11/03 0911) BP: (104-157)/(53-88) 124/63 (11/03 1013) SpO2:  [95 %-100 %] 96 % (11/03 0911) Weight:  [81.2 kg] 81.2 kg (11/02 1616) Last BM Date : 02/18/24 General:   Alert,  Well-developed, elderly white male sitting at the side of the bed pleasant and cooperative in NAD, family at bedside Head:  Normocephalic and atraumatic. Eyes:  Sclera clear, no icterus.   Conjunctiva pink. Ears:  Normal auditory acuity. Nose:  No deformity, discharge,  or lesions. Mouth:  No deformity or lesions.   Neck:   Supple; no masses or thyromegaly. Lungs:  Clear throughout to auscultation.   Decreased breath sounds bilaterally Heart:  Regular rate and rhythm; no murmurs, clicks, rubs,  or gallops. Abdomen:  Soft,nontender, BS active,nonpalp mass or hsm.  Healed midline incisional scar and ostomy scar Rectal: Not done, documented heme positive yesterday Msk:  Symmetrical without gross deformities. . Pulses:  Normal pulses noted. Extremities:  Without clubbing or edema. Neurologic:  Alert and  oriented x4;  grossly normal neurologically. Skin:  Intact without significant lesions or rashes.. Psych:  Alert and cooperative. Normal mood and affect.  Intake/Output from previous day: 11/02 0701 - 11/03 0700 In: 566.5 [I.V.:2.5; Blood:564] Out: 1025 [Urine:1025] Intake/Output this shift: Total I/O In: 398 [Blood:398] Out: 900 [Urine:900]  Lab Results: Recent Labs    02/18/24 1634 02/19/24 0356 02/19/24 1229  WBC 5.8 6.0 5.6  HGB 7.2* 7.1* 8.6*  HCT 24.2* 23.1* 27.3*  PLT 273 247 253   BMET Recent Labs    02/18/24 1658 02/19/24 0356  NA 135 136  K 3.9 4.0  CL 101 104  CO2 23 23  GLUCOSE 291* 184*  BUN 32* 30*  CREATININE 1.98* 1.78*  CALCIUM  8.5* 8.2*   LFT Recent Labs    02/18/24 1658  PROT 5.7*  ALBUMIN  3.1*  AST 15  ALT 11  ALKPHOS 50  BILITOT 0.6   PT/INR Recent Labs    02/18/24 1634  LABPROT 21.8*  INR 1.8*   Hepatitis Panel No results for input(s): HEPBSAG, HCVAB, HEPAIGM, HEPBIGM in the last 72 hours.    IMPRESSION:  #57 78 year old white male admitted through the ER with reported history of intermittent rectal bleeding over the past couple of months worse over the past month with darker and more purple appearing stools for the past week.  Patient with progressive fatigue/lethargy and exertional dyspnea.  Patient on chronic aspirin  and Xarelto   Hemoglobin 7.2 on admission  Patient had also had admission with symptomatic anemia in September 2025, did  not have workup at that time to but was transfused during that admission He was scheduled to have colonoscopy and EGD by his primary gastroenterologist Dr. Larene and Pierce later this week.  Patient with known diverticulosis, he had an episode of complicated diverticulitis February 2024 requiring diverting colostomy, then underwent sigmoidectomy and colostomy takedown March 2025/Dr. Gross.  He did have preoperative colonoscopy done February 2025 which was unrevealing per family report other than diverticular disease.  Etiology of his ongoing intermittent bleeding at this time is not clear consider upper GI etiology i.e. gastropathy, peptic ulcer disease, occult neoplasm, rule out anastomotic ulceration.  #2 history of atrial fibrillation #3 chronic anticoagulation-aspirin  and Xarelto  #4 heart  failure with reduced EF most recent EF 30 to 35% #5 chronic kidney disease stage IIIb #6.  Hypertension #7.  History of bladder cancer #8.  Diabetes mellitus #9.  MSSA SFA graft infection completing a course of oral antibiotics currently after IV antibiotics earlier in the summer. #10 peripheral vascular disease  Plan; clear liquid diet, n.p.o. past midnight Continue serial hemoglobins and transfuse to keep his hemoglobin 7.5-8 given multiple comorbidities and symptomatic state PPI daily Check iron studies Continue to hold Xarelto  Patient will be scheduled for EGD and colonoscopy with Dr. Albertus for tomorrow 02/20/2024.  Both procedures were discussed in detail with the patient including indications risks and benefits and he is agreeable to proceed.  Questions were answered. Further GI recommendations pending results of EGD and colonoscopy   Kema Santaella EsterwoodPA-C  02/19/2024, 1:11 PM

## 2024-02-19 NOTE — Assessment & Plan Note (Addendum)
 Cr of 1.98 on admission, baseline 1.3 with Stage 3b CKD. - Receiving 500mL bolus with PRBC transfusions - Consider spot dosing boluses of fluid and monitor patient closely with long history of HFrEF with an EF of 25%  - Hold home furosemide  20mg  daily - Avoid nephrotoxic agents

## 2024-02-19 NOTE — Assessment & Plan Note (Addendum)
 On problem list however patient not on Synthroid at this time.  - TSH and T4 WNL

## 2024-02-19 NOTE — Assessment & Plan Note (Signed)
-   Continue home rosuvastatin  40mg  daily  - Continue home Zetia  10mg  daily

## 2024-02-19 NOTE — Progress Notes (Signed)
 Daily Progress Note Intern Pager: [phone]  Patient name: [name] Medical record number: [id number] Date of birth: 05-10-1945 Age: 77 y.o. Gender: male  Primary Care Provider: Fleeta Finger, Selinda, MD Consultants: GI, ID Code Status: Full code without life prolonging measures, AD on file per pt  Pt Overview and Major Events to Date:  11/3: Admitted  Assessment and Plan: [name] is a 78 Y.O male w/ PMHx of PAD/PVD, chronic CHF, CKD Stage 3b, PAF on Xarelto , history of bladder cancer, diverticulitis s/p takedown 06/2023, CAD, ischemic cardiomyopathy with EF of 30-35%, R SFA graft with MSSA infection on oral abx, presenting with acute anemia secondary to GI bleed.  Assessment & Plan Anemia due to blood loss Hgb 7.2 on admission. Anemia most likely in the setting of aspirin  use, Xarelto  use, sigmoidectomy with colostomy with takedown 06/2023. - Admit to medsurg under Dr. Donzetta  - Transfused 1u PRBC in ED, follow up H/H low, second transfusion 1u PRBC 0500, awaiting post transfusion h/h, Transfusion threshold 8.  - NPO after midnight, awaiting GI consult, change to fluids if procedure not planned for today  - Hold Xarelto , continue home ASA - IV Protonix  40mg  BID - GI consulted and to see in AM, appreciate recommendations - CBC q8 - AM BMP AKI (acute kidney injury) CKD stage 3b, GFR 30-44 ml/min (HCC) Cr of 1.98 on admission, baseline 1.3 with Stage 3b CKD. - Receiving 500mL bolus with PRBC transfusions - Consider spot dosing boluses of fluid and monitor patient closely with long history of HFrEF with an EF of 25%  - Hold home furosemide  20mg  daily - Avoid nephrotoxic agents PAF (paroxysmal atrial fibrillation) (HCC) Currently in A-fib, rate controlled at 94bpm.   - Continue home ASA 81mg , hold Xarelto  2/2 GI bleed  - Continue home carvedilol  12.5mg  BID and monitor for hypotension - SCDs for VTE ppx - Consider transition to Eliquis given lower rates of GI bleed when  clinically stable and appropriate Type 2 diabetes mellitus treated with insulin  (HCC) Hgb A1C >10, 11/2023. - Sliding scale insulin  changed from moderate to sensitive - Lantus  20u at bedtime (takes 40u at bedtime at home) - CBG before meals and at bedtime - Hold home Mounjaro , glipizide  and Jardiance   Chronic HFrEF (heart failure with reduced ejection fraction) (HCC) 12/23/2023 echo: inferior and inferoseptal akinesis with mod-sev LV dysfuntion. EF 30-35%. LV with mod decreased function and regional wall abnormalities consistent with Grade II DD.  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 Treated with 6 weeks of IV cefazolin  prior to oral abx. Oral cefadroxil  started 10/11/2023 and set to continue for 4.5 months, culminating approximately in mid November 2025. - Continue oral cefadroxil  1000mg  BID - Consult ID for further recommendations in AM HTN (hypertension) 104/88 on admission. - Continue home amlodipine  5mg  daily - Continue home carvedilol  as above Hyperlipidemia - Continue home rosuvastatin  40mg  daily  - Continue home Zetia  10mg  daily  BPH (benign prostatic hyperplasia) - Hold flomax , as patient not taking - Restart if retaining or LUTS sxs develop Hypothyroidism On problem list however patient not on Synthroid at this time.  - TSH and T4 WNL    Chronic and Stable Problems: PAD/PVD, CAD, BPH   FEN/GI: NPO PPx: SCDs Dispo:Pending PT recommendations   Subjective:  Patient found laying in bed getting his second transfusion with girlfriend at  bedside, states he still feels the same since arrival very tired and fatigued. Patient is pleasant with no new complaints. They were consulted that GI will be coming by today which they are thankful for.   Objective: Temp:  [98 F (36.7 C)-98.7 F (37.1 C)] 98.4 F (36.9 C) (11/03  0616) Pulse Rate:  [81-103] 91 (11/03 0616) Resp:  [16-18] 16 (11/03 0615) BP: (104-157)/(53-88) 127/55 (11/03 0616) SpO2:  [95 %-100 %] 96 % (11/03 0616) Weight:  [81.2 kg] 81.2 kg (11/02 1616) Physical Exam: General: NAD, pale, alert, pleasant  Eyes: perrla, EOMI, non-icteric ENTM: hard of hearing Cardiovascular: Irreguarly irregular, no murmurs Respiratory: CTAB, normal WOB Abdomen: soft, non-tender, non-distended, BS present Derm: Diffuse palor  Laboratory: Most recent CBC Lab Results  Component Value Date   WBC 6.0 02/19/2024   HGB 7.1 (L) 02/19/2024   HCT 23.1 (L) 02/19/2024   MCV 84.0 02/19/2024   PLT 247 02/19/2024   Most recent BMP    Latest Ref Rng & Units 02/19/2024    3:56 AM  BMP  Glucose 70 - 99 mg/dL 815   BUN 8 - 23 mg/dL 30   Creatinine 9.38 - 1.24 mg/dL 8.21   Sodium 864 - 854 mmol/L 136   Potassium 3.5 - 5.1 mmol/L 4.0   Chloride 98 - 111 mmol/L 104   CO2 22 - 32 mmol/L 23   Calcium  8.9 - 10.3 mg/dL 8.2     Imaging/Diagnostic Tests: None  Dennis Palma, MD 02/19/2024, 7:41 AM  PGY-1, [facility] FPTS Intern pager: [phone], text pages welcome Secure chat group [facility] Teaching Service

## 2024-02-19 NOTE — Care Management Obs Status (Signed)
 MEDICARE OBSERVATION STATUS NOTIFICATION   Patient Details  Name: [name] MRN: [id number] Date of Birth: 10/30/1945   Medicare Observation Status Notification Given:  Yes    Stephane Powell Jansky, RN 02/19/2024, 2:31 PM

## 2024-02-19 NOTE — Assessment & Plan Note (Addendum)
 Hgb A1C >10, 11/2023. - Sliding scale insulin  changed from moderate to sensitive - Lantus  20u at bedtime (takes 40u at bedtime at home) - CBG before meals and at bedtime - Hold home Mounjaro , glipizide  and Jardiance 

## 2024-02-19 NOTE — Assessment & Plan Note (Signed)
 12/23/2023 echo: inferior and inferoseptal akinesis with mod-sev LV dysfuntion. EF 30-35%. LV with mod decreased function and regional wall abnormalities consistent with Grade II DD.  - Hold home furosemide  as above in setting of AKI

## 2024-02-19 NOTE — Assessment & Plan Note (Signed)
 104/88 on admission. - Continue home amlodipine  5mg  daily - Continue home carvedilol  as above

## 2024-02-19 NOTE — Assessment & Plan Note (Signed)
 Currently in A-fib, rate controlled at 94bpm.   - Continue home ASA 81mg , hold Xarelto  2/2 GI bleed  - Continue home carvedilol  12.5mg  BID and monitor for hypotension - SCDs for VTE ppx - Consider transition to Eliquis given lower rates of GI bleed when clinically stable and appropriate

## 2024-02-19 NOTE — Assessment & Plan Note (Signed)
 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 Treated with 6 weeks of IV cefazolin  prior to oral abx. Oral cefadroxil  started 10/11/2023 and set to continue for 4.5 months, culminating approximately in mid November 2025. - Continue oral cefadroxil  1000mg  BID - Consult ID for further recommendations in AM

## 2024-02-19 NOTE — Consult Note (Signed)
 Consultation  Referring Provider:  [facility] / [facility] Primary Care Physician:  Fleeta Finger, Selinda, MD Primary Gastroenterologist:  Dr.Misenheimer/ Pierce  Reason for Consultation: Recurrent anemia, rectal bleeding  HPI: [name] is a 78 y.o. male with history of atrial fibrillation, diabetes mellitus, chronic kidney disease stage IIIb, hypertension, bladder CA, peripheral vascular disease and congestive heart failure with reduced EF, with most recent EF at 30 to 35%.  Patient has been on Xarelto  and aspirin  at home. Patient was brought to the emergency room by family last evening due to concerns with progressive fatigue and lethargy in setting of noting blood in stools very frequently over the past couple of months.  The bleeding seems to have worsened with more dark and purplish appearing stools over the past week.  Patient is known to Dr. Larene in [place] and per family was scheduled to have colonoscopy and EGD on 02/20/2024. He had an episode of complicated diverticulitis in February 2024 and required diverting colostomy.  He underwent sigmoidectomy and colostomy takedown in March 2025 per Dr. Sheldon . He had undergone preoperative colonoscopy in February 2025 per Dr. Larene.  We do not have that report available but per his family they were told that everything looked good, no polyps removed.  Patient had admission here in September 2025 with symptomatic anemia, and required transfusion.  Per the notes he had also had a low hemoglobin in May 2025 when he was hospitalized with a lower extremity graft infection for which he has been on chronic antibiotics.  Labs in the ER 02/18/2024-WBC 5.8/hemoglobin 7.2/macro 24.2/MCV 84/platelets 273  Hemoglobin had been documented at 10.2 on 01/10/2024  Pro time 21.8/INR 1.8 Stool Hemoccult positive Sodium 135/potassium 3.9/BUN 32/creatinine 1.98/glucose 291 LFTs within normal limits  He has had 2 units of packed RBCs since  admission-with most recent hemoglobin 8.6/hematocrit 27.3.  No iron studies done  Patient denies any abdominal pain or cramping, no real changes in bowel habits other than noticing the blood with most of his bowel movements per family.  He had been placed on a PPI/Protonix  about a month ago per PCP for complaints of indigestion.  Appetite has been okay, no nausea or vomiting at home.  Patient has not had prior EGD, EGD had been planned by Dr. Larene to better understand his recurrent anemia and bleeding.      Past Medical History:  Diagnosis Date   Acute low back pain 01/12/2015   Acute lower limb ischemia 07/11/2023   Acute pain of both shoulders 07/22/2022   AKI (acute kidney injury) 05/24/2022   Arthritis    Atherosclerosis of native arteries of the extremities with intermittent claudication 08/13/2013   Atherosclerosis of native artery of extremity with intermittent claudication 08/13/2013   IMO SNOMED Dx Update Oct 2024     Backache 02/08/2013   Benign prostatic hyperplasia with urinary frequency 02/23/2023   Bladder cancer (HCC)    resection x3   BMI 27.0-27.9,adult 02/23/2023   Cellulitis 07/17/2023   Chronic combined systolic (congestive) and diastolic (congestive) heart failure (HCC) 07/22/2022   Chronic left shoulder pain 05/24/2023   Chronic right shoulder pain 09/02/2022   Chronic systolic CHF (congestive heart failure) (HCC)    CKD stage 3b, GFR 30-44 ml/min (HCC) 07/03/2023   Coronary artery disease    status post DMI RX Taxus stent RCA 2006 with susequent Stent LAD and subsequent  stent thrombosis RCA unable to be opened 2006 -neg mv 10/2008, 10/16/17 ISR to pLAD  with PTCA/DES, CTO of RCA with collaterals, EF 25%   Coronary artery disease involving native coronary artery of native heart with unstable angina pectoris (HCC)    Current moderate episode of major depressive disorder without prior episode (HCC) 03/28/2022   Diverticulitis 06/10/2022   Diverticulitis  large intestine 05/11/2021   Diverticulitis of large intestine with abscess 05/24/2022   Diverticulitis of sigmoid colon 05/11/2021   DM2 (diabetes mellitus, type 2) (HCC) 02/09/2022   status post bilateral aortobifemoral bypass [id number] with recent fem to fembypass April  2011 per DR. Early     Dyssynergic defecation 03/21/2023   Essential hypertension 07/22/2022   Fatigue 03/13/2023   History of bladder cancer 02/23/2023   History of colonic diverticulitis 03/21/2023   Hypercholesterolemia 10/28/2022   HYPERLIPIDEMIA-MIXED 09/22/2008   Qualifier: Diagnosis of  By: Justina Kos     Hypothyroidism 05/24/2022   Ischemic cardiomyopathy    ejection fraction of 40-45%   Lethargy 03/28/2022   Lumbar spinal stenosis    Myocardial infarction (HCC)    I've had 4 (10/16/2017)   Need for prophylactic vaccination and inoculation against influenza 03/28/2022   NSVT (nonsustained ventricular tachycardia) (HCC)    PAD (peripheral artery disease) 07/17/2023   PAF (paroxysmal atrial fibrillation) (HCC) 02/09/2022   Paroxysmal atrial fibrillation (HCC)    Peripheral arterial disease    Peripheral artery disease 07/09/2023   Poorly controlled T2 diabetes mellitus (HCC) 09/27/2022   Protein-calorie malnutrition, severe 05/29/2022   PVC's (premature ventricular contractions)    PVD 09/22/2008   Qualifier: Diagnosis of  By: Justina Kos     PVD (peripheral vascular disease) 02/23/2023   Rotator cuff arthropathy of right shoulder 09/27/2022   Sigmoid diverticulitis 06/30/2023   Status post coronary artery stent placement    Status post Hartmann's procedure (HCC) 03/21/2023   TOBACCO ABUSE 05/29/2009   Qualifier: Diagnosis of  By: Velinda, RN, BSN, Avelina CROME    Trigger ring finger of right hand 03/29/2023   Type 2 diabetes mellitus with other circulatory complications (HCC) 07/22/2022   Type II diabetes mellitus (HCC)    Unstable angina (HCC) 10/16/2017    Past Surgical History:  Procedure  Laterality Date   ABDOMINAL AORTOGRAM W/LOWER EXTREMITY Right 07/10/2023   Procedure: ABDOMINAL AORTOGRAM W/LOWER EXTREMITY;  Surgeon: Pearline Norman RAMAN, MD;  Location: [facility] INVASIVE CV LAB;  Service: Cardiovascular;  Laterality: Right;   AORTA - BILATERAL FEMORAL ARTERY BYPASS GRAFT Bilateral 1992   APPENDECTOMY     APPLICATION OF WOUND VAC Left 08/26/2023   Procedure: APPLICATION, WOUND VAC left groin;  Surgeon: Sheree Penne Bruckner, MD;  Location: [facility] OR;  Service: Vascular;  Laterality: Left;   APPLICATION, SKIN SUBSTITUTE Left 08/29/2023   Procedure: APPLICATION, SKIN SUBSTITUTE;  Surgeon: Sheree Penne Bruckner, MD;  Location: [facility] OR;  Service: Vascular;  Laterality: Left;   BACK SURGERY     COLECTOMY WITH COLOSTOMY CREATION/HARTMANN PROCEDURE N/A 05/26/2022   Procedure: COLECTOMY WITH COLOSTOMY CREATION/HARTMANN PROCEDURE;  Surgeon: Paola Dreama SAILOR, MD;  Location: [facility] OR;  Service: General;  Laterality: N/A;   CORONARY ANGIOPLASTY WITH STENT PLACEMENT     taxus stent  placed into his right coronary artery; stent placed to the LAD.     CORONARY STENT INTERVENTION N/A 10/16/2017   Procedure: CORONARY STENT INTERVENTION;  Surgeon: Verlin Bruckner BIRCH, MD;  Location: [facility] INVASIVE CV LAB;  Service: Cardiovascular;  Laterality: N/A;   FEMORAL-FEMORAL BYPASS GRAFT  07/2009   INCISION AND DRAINAGE OF WOUND Left 08/26/2023   Procedure:  IRRIGATION AND DEBRIDEMENT left groin  WOUND, application of antibiotic beads;  Surgeon: Sheree Penne Bruckner, MD;  Location: [facility] OR;  Service: Vascular;  Laterality: Left;  WASHOUT LEFT GROIN   INCISION AND DRAINAGE OF WOUND Left 08/29/2023   Procedure: IRRIGATION AND DEBRIDEMENT WOUND;  Surgeon: Sheree Penne Bruckner, MD;  Location: [facility] OR;  Service: Vascular;  Laterality: Left;  WASHOUT LEFT GROIN REPEAT   LAPAROTOMY N/A 05/26/2022   Procedure: EXPLORATORY LAPAROTOMY;  Surgeon: Paola Dreama SAILOR, MD;  Location: [facility] OR;  Service: General;  Laterality: N/A;    LOWER EXTREMITY INTERVENTION Right 07/10/2023   Procedure: LOWER EXTREMITY INTERVENTION;  Surgeon: Pearline Norman RAMAN, MD;  Location: [facility] INVASIVE CV LAB;  Service: Cardiovascular;  Laterality: Right;   LUMBAR MICRODISCECTOMY  12/31/08   LYSIS OF ADHESION N/A 06/30/2023   Procedure: LAPAROSCOPIC LYSIS OF ADHESIONS;  Surgeon: Sheldon Standing, MD;  Location: [facility] ORS;  Service: General;  Laterality: N/A;   PATCH ANGIOPLASTY Left 08/26/2023   Procedure: Primary repair of left femoral graftotomy;  Surgeon: Sheree Penne Bruckner, MD;  Location: [facility] OR;  Service: Vascular;  Laterality: Left;   PERIPHERAL VASCULAR THROMBECTOMY Right 07/10/2023   Procedure: PERIPHERAL VASCULAR THROMBECTOMY;  Surgeon: Pearline Norman RAMAN, MD;  Location: [facility] INVASIVE CV LAB;  Service: Cardiovascular;  Laterality: Right;   PROCTOSCOPY N/A 06/30/2023   Procedure: Flexible sigmoidoscopy;  Surgeon: Sheldon Standing, MD;  Location: [facility] ORS;  Service: General;  Laterality: N/A;   RIGHT/LEFT HEART CATH AND CORONARY ANGIOGRAPHY N/A 10/16/2017   Procedure: RIGHT/LEFT HEART CATH AND CORONARY ANGIOGRAPHY;  Surgeon: Verlin Bruckner BIRCH, MD;  Location: [facility] INVASIVE CV LAB;  Service: Cardiovascular;  Laterality: N/A;   SHOULDER OPEN ROTATOR CUFF REPAIR Left    VACUUM ASSISTED CLOSURE CHANGE Left 08/29/2023   Procedure: REPLACEMENT, WOUND VAC DRESSING, GROIN;  Surgeon: Sheree Penne Bruckner, MD;  Location: [facility] OR;  Service: Vascular;  Laterality: Left;   XI ROBOTIC ASSISTED COLOSTOMY TAKEDOWN N/A 06/30/2023   Procedure: XI ROBOTIC ASSISTED OSTOMY TAKEDOWN, PARTIAL RECTOSIGMOID RESECTION, BILATERAL TAP BLOCK;  Surgeon: Sheldon Standing, MD;  Location: [facility] ORS;  Service: General;  Laterality: N/A;    Prior to Admission medications   Medication Sig Start Date End Date Taking? Authorizing Provider  acetaminophen  (TYLENOL ) 500 MG tablet Take 1,000 mg by mouth every 6 (six) hours as needed for headache or fever (pain).   Yes [provider]   amLODipine  (NORVASC ) 5 MG tablet TAKE 1 TABLET(5 MG) BY MOUTH DAILY 11/13/23  Yes Krasowski, Robert J, MD  aspirin  EC 81 MG tablet Take 1 tablet (81 mg total) by mouth daily. Swallow whole. 05/10/21  Yes Verlin Bruckner BIRCH, MD  carvedilol  (COREG ) 12.5 MG tablet Take 1 tablet (12.5 mg total) by mouth 2 (two) times daily with a meal. 01/24/23  Yes Bernie Lamar PARAS, MD  cefadroxil  (DURICEF) 500 MG capsule Take 2 capsules (1,000 mg total) by mouth 2 (two) times daily. 10/05/23  Yes Dennise Kingsley, MD  empagliflozin  (JARDIANCE ) 10 MG TABS tablet Take 1 tablet (10 mg total) by mouth daily. 01/04/24  Yes Shamleffer, Ibtehal Jaralla, MD  ezetimibe  (ZETIA ) 10 MG tablet Take 1 tablet (10 mg total) by mouth daily. 03/01/23 02/24/24 Yes Krasowski, Robert J, MD  furosemide  (LASIX ) 20 MG tablet Take 1 tablet (20 mg total) by mouth every morning. Patient taking differently: Take 20 mg by mouth every Monday, Wednesday, and Friday. 12/27/23 12/26/24 Yes Fleeta Valeria Mayo, MD  insulin  glargine (LANTUS ) 100 unit/mL SOPN Inject 40 Units into the  skin daily. 46 units subcut daily at nighttime Patient taking differently: Inject 40 Units into the skin at bedtime. 01/04/24  Yes Shamleffer, Ibtehal Jaralla, MD  Insulin  Pen Needle 32G X 4 MM MISC 1 Device by Does not apply route daily in the afternoon. 01/04/24  Yes Shamleffer, Ibtehal Jaralla, MD  nitroGLYCERIN  (NITROSTAT ) 0.4 MG SL tablet Place 0.4 mg under the tongue every 5 (five) minutes x 3 doses as needed for chest pain.   Yes [provider]  pantoprazole  (PROTONIX ) 40 MG tablet Take 40 mg by mouth 2 (two) times daily.   Yes [provider]  rivaroxaban  (XARELTO ) 20 MG TABS tablet Take 1 tablet (20 mg total) by mouth daily with supper. Patient taking differently: Take 20 mg by mouth at bedtime. 12/23/22  Yes Fleeta Valeria Mayo, MD  rosuvastatin  (CRESTOR ) 40 MG tablet TAKE 1 TABLET(40 MG) BY MOUTH DAILY 02/05/24  Yes Fleeta Valeria Mayo, MD  tirzepatide  (MOUNJARO )  5 MG/0.5ML Pen Inject 5 mg into the skin once a week. Patient taking differently: Inject 5 mg into the skin every Sunday. 01/04/24  Yes Shamleffer, Ibtehal Jaralla, MD  glipiZIDE  (GLUCOTROL ) 5 MG tablet Take 1 tablet (5 mg total) by mouth 2 (two) times daily before a meal. Patient not taking: Reported on 02/19/2024 01/04/24   Shamleffer, Ibtehal Jaralla, MD  isosorbide  dinitrate (ISORDIL ) 10 MG tablet Take 1 tablet (10 mg total) by mouth 2 (two) times daily. Patient not taking: Reported on 02/19/2024 09/15/23   Carlin Delon BROCKS, NP    Current Facility-Administered Medications  Medication Dose Route Frequency Provider Last Rate Last Admin   0.9 %  sodium chloride  infusion   Intravenous Continuous Moris Ratchford S, PA-C       acetaminophen  (TYLENOL ) tablet 650 mg  650 mg Oral Q6H PRN Lupie Credit, DO       Or   acetaminophen  (TYLENOL ) suppository 650 mg  650 mg Rectal Q6H PRN Majeed, Credit, DO       amLODipine  (NORVASC ) tablet 5 mg  5 mg Oral Daily Majeed, Sara, DO   5 mg at 02/19/24 1014   bisacodyl  (DULCOLAX) EC tablet 10 mg  10 mg Oral Once Exie Chrismer S, PA-C       carvedilol  (COREG ) tablet 12.5 mg  12.5 mg Oral BID WC Majeed, Sara, DO   12.5 mg at 02/19/24 1013   cefadroxil  (DURICEF) capsule 500 mg  500 mg Oral BID Luiz Channel, MD   500 mg at 02/19/24 1015   ezetimibe  (ZETIA ) tablet 10 mg  10 mg Oral Daily Lupie Credit, DO   10 mg at 02/19/24 1013   insulin  aspart (novoLOG ) injection 0-9 Units  0-9 Units Subcutaneous Q6H Elodie Palma, MD   1 Units at 02/19/24 1237   insulin  glargine-yfgn (SEMGLEE ) injection 20 Units  20 Units Subcutaneous QHS Lupie Credit, DO   20 Units at 02/18/24 2250   pantoprazole  (PROTONIX ) injection 40 mg  40 mg Intravenous Q12H Lupie Credit, DO   40 mg at 02/19/24 1015   polyethylene glycol-electrolytes (NuLYTELY ) solution 2,000 mL  2,000 mL Oral Once Haydyn Girvan S, PA-C       Followed by   polyethylene glycol-electrolytes (NuLYTELY ) solution 2,000 mL  2,000  mL Oral Once Denyla Cortese S, PA-C       rosuvastatin  (CRESTOR ) tablet 40 mg  40 mg Oral Daily Majeed, Sara, DO   40 mg at 02/19/24 1013   simethicone  (MYLICON) chewable tablet 240 mg  240 mg Oral Once Toma Erichsen,  Matthias Bogus S, PA-C       Followed by   simethicone  (MYLICON) chewable tablet 240 mg  240 mg Oral Once Alexcis Bicking S, PA-C        Allergies as of 02/18/2024 - Review Complete 02/18/2024  Allergen Reaction Noted   Lisinopril  Swelling and Rash 12/14/2011   Levaquin [levofloxacin]  08/26/2023    Family History  Problem Relation Age of Onset   Coronary artery disease Unknown    Diabetes Unknown    Cardiomyopathy Mother    Heart disease Mother    Hyperlipidemia Mother    Coronary artery disease Father    Stroke Father    Diabetes Father    Heart disease Father        before age 70   Hyperlipidemia Father    Heart attack Father    Diabetes Sister    Heart disease Sister        before age 46   Hyperlipidemia Sister    Heart attack Sister     Social History   Socioeconomic History   Marital status: Divorced    Spouse name: Not on file   Number of children: 1   Years of education: Not on file   Highest education level: Not on file  Occupational History   Occupation: retired    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     Social Drivers of Corporate Investment Banker Strain: Not on file  Food Insecurity: No Food  Insecurity (02/18/2024)   Hunger Vital Sign    Worried About Running Out of Food in the Last Year: Never true    Ran Out of Food in the Last Year: Never true  Transportation Needs: No Transportation Needs (02/18/2024)   PRAPARE - Administrator, Civil Service (Medical): No    Lack of Transportation (Non-Medical): No  Physical Activity: Not on file  Stress: Not on file  Social Connections: Unknown (02/18/2024)   Social Connection and Isolation Panel    Frequency of Communication with Friends and Family: More than three times a week    Frequency of Social Gatherings with Friends and Family: More than three times a week    Attends Religious Services: More than 4 times per year    Active Member of [employer] or Organizations: Yes    Attends Banker Meetings: 1 to 4 times per year    Marital Status: Patient unable to answer  Intimate Partner Violence: Not At Risk (02/18/2024)   Humiliation, Afraid, Rape, and Kick questionnaire    Fear of Current or Ex-Partner: No    Emotionally Abused: No    Physically Abused: No    Sexually Abused: No    Review of Systems: Pertinent positive and negative review of systems were noted in the  above HPI section.  All other review of systems was otherwise negative.   Physical Exam: Vital signs in last 24 hours: Temp:  [98 F (36.7 C)-98.7 F (37.1 C)] 98.2 F (36.8 C) (11/03 0911) Pulse Rate:  [81-103] 89 (11/03 1013) Resp:  [16-18] 17 (11/03 0911) BP: (104-157)/(53-88) 124/63 (11/03 1013) SpO2:  [95 %-100 %] 96 % (11/03 0911) Weight:  [81.2 kg] 81.2 kg (11/02 1616) Last BM Date : 02/18/24 General:   Alert,  Well-developed, elderly white male sitting at the side of the bed pleasant and cooperative in NAD, family at bedside Head:  Normocephalic and atraumatic. Eyes:  Sclera clear, no icterus.   Conjunctiva pink. Ears:  Normal auditory acuity. Nose:  No deformity, discharge,  or lesions. Mouth:  No deformity or lesions.   Neck:   Supple; no masses or thyromegaly. Lungs:  Clear throughout to auscultation.   Decreased breath sounds bilaterally Heart:  Regular rate and rhythm; no murmurs, clicks, rubs,  or gallops. Abdomen:  Soft,nontender, BS active,nonpalp mass or hsm.  Healed midline incisional scar and ostomy scar Rectal: Not done, documented heme positive yesterday Msk:  Symmetrical without gross deformities. . Pulses:  Normal pulses noted. Extremities:  Without clubbing or edema. Neurologic:  Alert and  oriented x4;  grossly normal neurologically. Skin:  Intact without significant lesions or rashes.. Psych:  Alert and cooperative. Normal mood and affect.  Intake/Output from previous day: 11/02 0701 - 11/03 0700 In: 566.5 [I.V.:2.5; Blood:564] Out: 1025 [Urine:1025] Intake/Output this shift: Total I/O In: 398 [Blood:398] Out: 900 [Urine:900]  Lab Results: Recent Labs    02/18/24 1634 02/19/24 0356 02/19/24 1229  WBC 5.8 6.0 5.6  HGB 7.2* 7.1* 8.6*  HCT 24.2* 23.1* 27.3*  PLT 273 247 253   BMET Recent Labs    02/18/24 1658 02/19/24 0356  NA 135 136  K 3.9 4.0  CL 101 104  CO2 23 23  GLUCOSE 291* 184*  BUN 32* 30*  CREATININE 1.98* 1.78*  CALCIUM  8.5* 8.2*   LFT Recent Labs    02/18/24 1658  PROT 5.7*  ALBUMIN  3.1*  AST 15  ALT 11  ALKPHOS 50  BILITOT 0.6   PT/INR Recent Labs    02/18/24 1634  LABPROT 21.8*  INR 1.8*   Hepatitis Panel No results for input(s): HEPBSAG, HCVAB, HEPAIGM, HEPBIGM in the last 72 hours.    IMPRESSION:  #57 78 year old white male admitted through the ER with reported history of intermittent rectal bleeding over the past couple of months worse over the past month with darker and more purple appearing stools for the past week.  Patient with progressive fatigue/lethargy and exertional dyspnea.  Patient on chronic aspirin  and Xarelto   Hemoglobin 7.2 on admission  Patient had also had admission with symptomatic anemia in September 2025, did  not have workup at that time to but was transfused during that admission He was scheduled to have colonoscopy and EGD by his primary gastroenterologist Dr. Larene and Pierce later this week.  Patient with known diverticulosis, he had an episode of complicated diverticulitis February 2024 requiring diverting colostomy, then underwent sigmoidectomy and colostomy takedown March 2025/Dr. Gross.  He did have preoperative colonoscopy done February 2025 which was unrevealing per family report other than diverticular disease.  Etiology of his ongoing intermittent bleeding at this time is not clear consider upper GI etiology i.e. gastropathy, peptic ulcer disease, occult neoplasm, rule out anastomotic ulceration.  #2 history of atrial fibrillation #3 chronic anticoagulation-aspirin  and Xarelto  #4 heart  failure with reduced EF most recent EF 30 to 35% #5 chronic kidney disease stage IIIb #6.  Hypertension #7.  History of bladder cancer #8.  Diabetes mellitus #9.  MSSA SFA graft infection completing a course of oral antibiotics currently after IV antibiotics earlier in the summer. #10 peripheral vascular disease  Plan; clear liquid diet, n.p.o. past midnight Continue serial hemoglobins and transfuse to keep his hemoglobin 7.5-8 given multiple comorbidities and symptomatic state PPI daily Check iron studies Continue to hold Xarelto  Patient will be scheduled for EGD and colonoscopy with Dr. Albertus for tomorrow 02/20/2024.  Both procedures were discussed in detail with the patient including indications risks and benefits and he is agreeable to proceed.  Questions were answered. Further GI recommendations pending results of EGD and colonoscopy   Kema Santaella EsterwoodPA-C  02/19/2024, 1:11 PM

## 2024-02-19 NOTE — Care Management CC44 (Signed)
 Condition Code 44 Documentation Completed  Patient Details  Name: [name] MRN: [id number] Date of Birth: 04-29-1945   Condition Code 44 given:  Yes Patient signature on Condition Code 44 notice:  Yes Documentation of 2 MD's agreement:  Yes Code 44 added to claim:       Stephane Powell Jansky, RN 02/19/2024, 2:30 PM

## 2024-02-20 ENCOUNTER — Ambulatory Visit: Admitting: Cardiology

## 2024-02-20 ENCOUNTER — Observation Stay (HOSPITAL_COMMUNITY): Admitting: Anesthesiology

## 2024-02-20 ENCOUNTER — Encounter (HOSPITAL_COMMUNITY): Payer: Self-pay

## 2024-02-20 ENCOUNTER — Encounter (HOSPITAL_COMMUNITY)
Admission: EM | Disposition: A | Discharge status: Home / Self Care | Payer: Self-pay | Source: Home / Self Care | Attending: Family Medicine

## 2024-02-20 DIAGNOSIS — N179 Acute kidney failure, unspecified: Secondary | ICD-10-CM | POA: Diagnosis present

## 2024-02-20 DIAGNOSIS — I25119 Atherosclerotic heart disease of native coronary artery with unspecified angina pectoris: Secondary | ICD-10-CM

## 2024-02-20 DIAGNOSIS — I480 Paroxysmal atrial fibrillation: Secondary | ICD-10-CM | POA: Diagnosis present

## 2024-02-20 DIAGNOSIS — Q2733 Arteriovenous malformation of digestive system vessel: Secondary | ICD-10-CM

## 2024-02-20 DIAGNOSIS — K635 Polyp of colon: Secondary | ICD-10-CM

## 2024-02-20 DIAGNOSIS — K5730 Diverticulosis of large intestine without perforation or abscess without bleeding: Secondary | ICD-10-CM

## 2024-02-20 DIAGNOSIS — Z980 Intestinal bypass and anastomosis status: Secondary | ICD-10-CM

## 2024-02-20 DIAGNOSIS — E1122 Type 2 diabetes mellitus with diabetic chronic kidney disease: Secondary | ICD-10-CM | POA: Diagnosis present

## 2024-02-20 DIAGNOSIS — D123 Benign neoplasm of transverse colon: Secondary | ICD-10-CM

## 2024-02-20 DIAGNOSIS — Z87891 Personal history of nicotine dependence: Secondary | ICD-10-CM | POA: Diagnosis not present

## 2024-02-20 DIAGNOSIS — I2510 Atherosclerotic heart disease of native coronary artery without angina pectoris: Secondary | ICD-10-CM | POA: Diagnosis present

## 2024-02-20 DIAGNOSIS — E7800 Pure hypercholesterolemia, unspecified: Secondary | ICD-10-CM | POA: Diagnosis present

## 2024-02-20 DIAGNOSIS — Z7985 Long-term (current) use of injectable non-insulin antidiabetic drugs: Secondary | ICD-10-CM | POA: Diagnosis not present

## 2024-02-20 DIAGNOSIS — K5521 Angiodysplasia of colon with hemorrhage: Secondary | ICD-10-CM | POA: Diagnosis present

## 2024-02-20 DIAGNOSIS — I255 Ischemic cardiomyopathy: Secondary | ICD-10-CM | POA: Diagnosis present

## 2024-02-20 DIAGNOSIS — K625 Hemorrhage of anus and rectum: Secondary | ICD-10-CM | POA: Diagnosis present

## 2024-02-20 DIAGNOSIS — D125 Benign neoplasm of sigmoid colon: Secondary | ICD-10-CM

## 2024-02-20 DIAGNOSIS — Z7984 Long term (current) use of oral hypoglycemic drugs: Secondary | ICD-10-CM | POA: Diagnosis not present

## 2024-02-20 DIAGNOSIS — K31819 Angiodysplasia of stomach and duodenum without bleeding: Secondary | ICD-10-CM | POA: Diagnosis not present

## 2024-02-20 DIAGNOSIS — D62 Acute posthemorrhagic anemia: Secondary | ICD-10-CM | POA: Diagnosis present

## 2024-02-20 DIAGNOSIS — Z789 Other specified health status: Secondary | ICD-10-CM

## 2024-02-20 DIAGNOSIS — I5042 Chronic combined systolic (congestive) and diastolic (congestive) heart failure: Secondary | ICD-10-CM | POA: Diagnosis present

## 2024-02-20 DIAGNOSIS — K922 Gastrointestinal hemorrhage, unspecified: Secondary | ICD-10-CM | POA: Diagnosis not present

## 2024-02-20 DIAGNOSIS — I130 Hypertensive heart and chronic kidney disease with heart failure and stage 1 through stage 4 chronic kidney disease, or unspecified chronic kidney disease: Secondary | ICD-10-CM | POA: Diagnosis present

## 2024-02-20 DIAGNOSIS — B9561 Methicillin susceptible Staphylococcus aureus infection as the cause of diseases classified elsewhere: Secondary | ICD-10-CM | POA: Diagnosis present

## 2024-02-20 DIAGNOSIS — E1151 Type 2 diabetes mellitus with diabetic peripheral angiopathy without gangrene: Secondary | ICD-10-CM | POA: Diagnosis present

## 2024-02-20 DIAGNOSIS — K31811 Angiodysplasia of stomach and duodenum with bleeding: Secondary | ICD-10-CM | POA: Diagnosis present

## 2024-02-20 DIAGNOSIS — D122 Benign neoplasm of ascending colon: Secondary | ICD-10-CM | POA: Diagnosis present

## 2024-02-20 DIAGNOSIS — D649 Anemia, unspecified: Secondary | ICD-10-CM | POA: Diagnosis not present

## 2024-02-20 DIAGNOSIS — N1832 Chronic kidney disease, stage 3b: Secondary | ICD-10-CM | POA: Diagnosis present

## 2024-02-20 DIAGNOSIS — E039 Hypothyroidism, unspecified: Secondary | ICD-10-CM | POA: Diagnosis present

## 2024-02-20 DIAGNOSIS — Z794 Long term (current) use of insulin: Secondary | ICD-10-CM | POA: Diagnosis not present

## 2024-02-20 DIAGNOSIS — K5731 Diverticulosis of large intestine without perforation or abscess with bleeding: Secondary | ICD-10-CM | POA: Diagnosis present

## 2024-02-20 DIAGNOSIS — Z7901 Long term (current) use of anticoagulants: Secondary | ICD-10-CM | POA: Diagnosis not present

## 2024-02-20 DIAGNOSIS — E1165 Type 2 diabetes mellitus with hyperglycemia: Secondary | ICD-10-CM | POA: Diagnosis present

## 2024-02-20 HISTORY — PX: COLONOSCOPY: SHX5424

## 2024-02-20 HISTORY — PX: HOT HEMOSTASIS: SHX5433

## 2024-02-20 HISTORY — PX: ESOPHAGOGASTRODUODENOSCOPY: SHX5428

## 2024-02-20 HISTORY — PX: GIVENS CAPSULE STUDY: SHX5432

## 2024-02-20 LAB — TYPE AND SCREEN
ABO/RH(D): O NEG
Antibody Screen: NEGATIVE
Unit division: 0

## 2024-02-20 LAB — BPAM RBC
Blood Product Expiration Date: 202511142359
Blood Product Expiration Date: 202511132359
ISSUE DATE / TIME: 202511030538
ISSUE DATE / TIME: 202511021904
Unit Type and Rh: 9500
Unit Type and Rh: 9500

## 2024-02-20 LAB — BASIC METABOLIC PANEL WITH GFR
Anion gap: 10 (ref 5–15)
BUN: 21 mg/dL (ref 8–23)
CO2: 24 mmol/L (ref 22–32)
Calcium: 8.3 mg/dL — ABNORMAL LOW (ref 8.9–10.3)
Chloride: 104 mmol/L (ref 98–111)
Creatinine, Ser: 1.74 mg/dL — ABNORMAL HIGH (ref 0.61–1.24)
GFR, Estimated: 39 mL/min — ABNORMAL LOW (ref >60)
Glucose, Bld: 110 mg/dL — ABNORMAL HIGH (ref 70–99)
Potassium: 3.7 mmol/L (ref 3.5–5.1)
Sodium: 138 mmol/L (ref 135–145)

## 2024-02-20 LAB — CBC
HCT: 25.9 % — ABNORMAL LOW (ref 39.0–52.0)
HCT: 26.2 % — ABNORMAL LOW (ref 39.0–52.0)
HCT: 27.1 % — ABNORMAL LOW (ref 39.0–52.0)
Hemoglobin: 8 g/dL — ABNORMAL LOW (ref 13.0–17.0)
Hemoglobin: 8.2 g/dL — ABNORMAL LOW (ref 13.0–17.0)
Hemoglobin: 8.3 g/dL — ABNORMAL LOW (ref 13.0–17.0)
MCH: 26.1 pg (ref 26.0–34.0)
MCH: 26.4 pg (ref 26.0–34.0)
MCH: 26.2 pg (ref 26.0–34.0)
MCHC: 30.9 g/dL (ref 30.0–36.0)
MCHC: 31.3 g/dL (ref 30.0–36.0)
MCHC: 30.6 g/dL (ref 30.0–36.0)
MCV: 84.6 fL (ref 80.0–100.0)
MCV: 84.2 fL (ref 80.0–100.0)
MCV: 85.5 fL (ref 80.0–100.0)
Platelets: 246 K/uL (ref 150–400)
Platelets: 249 K/uL (ref 150–400)
Platelets: 256 K/uL (ref 150–400)
RBC: 3.06 MIL/uL — ABNORMAL LOW (ref 4.22–5.81)
RBC: 3.11 MIL/uL — ABNORMAL LOW (ref 4.22–5.81)
RBC: 3.17 MIL/uL — ABNORMAL LOW (ref 4.22–5.81)
RDW: 24.1 % — ABNORMAL HIGH (ref 11.5–15.5)
RDW: 24.5 % — ABNORMAL HIGH (ref 11.5–15.5)
RDW: 24.2 % — ABNORMAL HIGH (ref 11.5–15.5)
WBC: 5.9 K/uL (ref 4.0–10.5)
WBC: 5.8 K/uL (ref 4.0–10.5)
WBC: 6.8 K/uL (ref 4.0–10.5)
nRBC: 0 % (ref 0.0–0.2)
nRBC: 0 % (ref 0.0–0.2)
nRBC: 0 % (ref 0.0–0.2)

## 2024-02-20 LAB — GLUCOSE, CAPILLARY
Glucose-Capillary: 122 mg/dL — ABNORMAL HIGH (ref 70–99)
Glucose-Capillary: 114 mg/dL — ABNORMAL HIGH (ref 70–99)
Glucose-Capillary: 135 mg/dL — ABNORMAL HIGH (ref 70–99)
Glucose-Capillary: 168 mg/dL — ABNORMAL HIGH (ref 70–99)

## 2024-02-20 LAB — PROTIME-INR
INR: 1.1 (ref 0.8–1.2)
Prothrombin Time: 14.9 s (ref 11.4–15.2)

## 2024-02-20 SURGERY — EGD (ESOPHAGOGASTRODUODENOSCOPY)
Anesthesia: Monitor Anesthesia Care

## 2024-02-20 MED ORDER — METOCLOPRAMIDE HCL 5 MG/ML IJ SOLN
10.0000 mg | Freq: Once | INTRAMUSCULAR | Status: AC
  Administered 2024-02-20: 10 mg via INTRAVENOUS
  Filled 2024-02-20: qty 2

## 2024-02-20 MED ORDER — PROPOFOL 500 MG/50ML IV EMUL
INTRAVENOUS | Status: DC | PRN
  Administered 2024-02-20: 25 mg via INTRAVENOUS
  Administered 2024-02-20: 20 mg via INTRAVENOUS
  Administered 2024-02-20: 125 ug/kg/min via INTRAVENOUS

## 2024-02-20 MED ORDER — PANTOPRAZOLE SODIUM 40 MG PO TBEC
40.0000 mg | DELAYED_RELEASE_TABLET | Freq: Every day | ORAL | Status: DC
  Administered 2024-02-21 – 2024-02-25 (×5): 40 mg via ORAL
  Filled 2024-02-20 (×5): qty 1

## 2024-02-20 NOTE — Assessment & Plan Note (Deleted)
-   Hold flomax , as patient not taking - Restart if retaining or LUTS sxs develop

## 2024-02-20 NOTE — Progress Notes (Signed)
 [facility] capsule endoscopy ordered by MD Pyrtle.  Patient ingested capsule at 1215.  Per Given's capsule instructions, patient to remain NPO until 1415 at which time they may progress to clear liquid diet. At 1615 patient may have a small snack such as a half a sandwich or a bowl of soup. At 2015 patient may progress to previously ordered diet.  The capsule endoscopy study will conclude at 0015 (11/5) at which time the recorder and leads or belt can be removed and placed in a patient belongings bag. Endoscopy staff will pick up the equipment in the AM.  Instructions provided to patient and inpatient RN. Patient and RN demonstrated understanding.

## 2024-02-20 NOTE — Assessment & Plan Note (Addendum)
 Hypothyroidism: TSH and T4 WNL BPH: Hold flomax , as patient not taking. Restart if retaining or LUTS sxs develop HLD:  Continue home rosuvastatin  40mg  daily and zetia  10mg  daily HTN: Continue home amlodipine  40 mg daily and home carvedilol  as above MSSA: ID consulted, recommended decreasing cefadroxil  from 1000mg  bid to 500mg  bid HFrEF: 12/23/2023 echo: inferior and inferoseptal akinesis with mod-sev LV dysfuntion. EF 30-35%. LV with mod decreased function and regional wall abnormalities consistent with Grade II DD. Hold home furosemide  as above in setting of AKI.

## 2024-02-20 NOTE — Assessment & Plan Note (Deleted)
 12/23/2023 echo: inferior and inferoseptal akinesis with mod-sev LV dysfuntion. EF 30-35%. LV with mod decreased function and regional wall abnormalities consistent with Grade II DD.  - Hold home furosemide  as above in setting of AKI

## 2024-02-20 NOTE — Assessment & Plan Note (Addendum)
 Cr improving, unknown baseline with Stage 3b CKD. Suspect 2/2 pre-renal/blood loss - Receiving 500mL bolus with PRBC transfusions. Transfusion Hb threshold 7.5 - 8 via GI - Consider spot dosing 500mL boluses of fluid and monitor patient closely with long history of HFrEF with an EF of 25%  - Hold home furosemide  20mg  daily - Avoid nephrotoxic agents

## 2024-02-20 NOTE — Assessment & Plan Note (Deleted)
 Hgb 7.2 on admission. Anemia most likely in the setting of aspirin  use, Xarelto  use, sigmoidectomy with colostomy with takedown 06/2023. - Admit to medsurg under Dr. Donzetta  - Transfused 1u PRBC in ED, follow up H/H low, second transfusion 1u PRBC 0500, follow up H/H 8.6 - GI consulted, recommend to continue serial Hb and transfuse to keep Hb 7.5 - 8 - F/u GI pending results of EGD and colonoscopy scheduled for 1100  - Hold Xarelto  per GI - IV Protonix  40mg  BID - CBC q8 - AM BMP, CBC

## 2024-02-20 NOTE — Discharge Instructions (Addendum)
 Dear [name],  Thank you for letting us  participate in your care. You were hospitalized for symptomatic anemia and diagnosed with Acute GI bleeding. You were treated with blood transfusions and testing showed your bleeding had improved.   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  MEDICATION CHANGES -   POST-HOSPITAL & CARE INSTRUCTIONS Please take your cefadroxil  as directed Take 20 units of lantus  daily until your PCP sees you Don't take jardiance , lasix , or isosorbide  until you see your PCP because your blood pressure was low. Stop taking Xarelto . We switched this to Eliquis 2.5mg  twice a day.  Please call to arrange follow up with your primary care physician in 1 week.  Go to your follow up appointments (listed below)   DOCTOR'S APPOINTMENT   Future Appointments  Date Time Provider Department Center  03/06/2024  9:30 AM Shamleffer, Donell Cardinal, MD [facility] None  03/22/2024 10:00 AM Luiz Channel, MD [facility]  03/25/2024  8:30 AM Fleeta Valeria Mayo, MD [facility] None  04/12/2024  2:00 PM Bernie Lamar PARAS, MD [facility] None  06/12/2024 10:00 AM [facility] 10 [facility] H&V  06/12/2024 11:00 AM [facility] 10 [facility] H&V  06/12/2024 11:15 AM [facility] PA-2 [facility] H&V     Take care and be well!  Family Medicine Teaching Service Inpatient Team [place]  [facility]  [address] [place], [place] [address] [phone]              Information on my medicine - ELIQUIS (apixaban)  This medication education was reviewed with me or my healthcare representative as part of my discharge preparation.  The pharmacist that spoke with me during my hospital stay was:    Why was Eliquis prescribed for you? Eliquis was prescribed for you to reduce the risk of a blood clot forming that can cause a stroke if you have a medical condition  called atrial fibrillation (a type of irregular heartbeat).  What do You need to know about Eliquis ? Take your Eliquis TWICE DAILY - one tablet in the morning and one tablet in the evening with or without food. If you have difficulty swallowing the tablet whole please discuss with your pharmacist how to take the medication safely.  Take Eliquis exactly as prescribed by your doctor and DO NOT stop taking Eliquis without talking to the doctor who prescribed the medication.  Stopping may increase your risk of developing a stroke.  Refill your prescription before you run out.  After discharge, you should have regular check-up appointments with your healthcare provider that is prescribing your Eliquis.  In the future your dose may need to be changed if your kidney function or weight changes by a significant amount or as you get older.  What do you do if you miss a dose? If you miss a dose, take it as soon as you remember on the same day and resume taking twice daily.  Do not take more than one dose of ELIQUIS at the same time to make up a missed dose.  Important Safety Information A possible side effect of Eliquis is bleeding. You should call your healthcare provider right away if you experience any of the following: Bleeding from an injury or your nose that does not stop. Unusual colored urine (red or dark brown) or unusual colored stools (red or black). Unusual bruising for unknown reasons. A serious fall or if  you hit your head (even if there is no bleeding).  Some medicines may interact with Eliquis and might increase your risk of bleeding or clotting while on Eliquis. To help avoid this, consult your healthcare provider or pharmacist prior to using any new prescription or non-prescription medications, including herbals, vitamins, non-steroidal anti-inflammatory drugs (NSAIDs) and supplements.  This website has more information on Eliquis (apixaban): http://www.eliquis.com/eliquis/home

## 2024-02-20 NOTE — Progress Notes (Addendum)
 Daily Progress Note Intern Pager: [phone]  Patient name: [name] Medical record number: [id number] Date of birth: 1946/01/10 Age: 78 y.o. Gender: male  Primary Care Provider: Fleeta Finger, Selinda, MD Consultants: GI, ID Code Status: Full code without life prolonging measures, AD on file pr pt  Pt Overview and Major Events to Date:  11/3: Admitted, Hb 7 on admission. Had 2 units of packed RBCs day 1, with Hb 7 -- 7.2 -- 8.6 11/4: Hb continues to downtrend, CBC q 8 Hb 8.6 -- 8.2 -- 8.0 @ 0500. EGD and colonoscopy scheduled for today  Assessment and Plan: [name] is a 78 y.o. male with a PMHx of PAD/PVD, chronic CHF, CKD Stage 3b, PAF on Xarelto , history of bladder cancer, diverticulitis s/p takedown 06/2023, CAD, ischemic cardiomyopathy with EF of 30-35%, R SFA graft with MSSA infection on oral antibiotics, presenting with acute anemia secondary to GI bleed.  Assessment & Plan Anemia due to blood loss GI bleed Hgb 7.2 on admission. 2 transfusions of PRBC increased Hb to 8.6 on 11/3. Anemia most likely in the setting of aspirin  use, Xarelto  use, sigmoidectomy with colostomy with takedown 06/2023. - GI consulted, recommend to continue serial Hb and transfuse to keep Hb 7.5 - 8 - F/u GI pending results of EGD and colonoscopy - Hold Xarelto  per GI - F/u GI about when to restart Aspirin  - IV Protonix  40mg  BID - CBC q8 AKI (acute kidney injury) CKD stage 3b, GFR 30-44 ml/min (HCC) Cr improving, unknown baseline with Stage 3b CKD. Suspect 2/2 pre-renal/blood loss - Receiving 500mL bolus with PRBC transfusions. Transfusion Hb threshold 7.5 - 8 via GI - Consider spot dosing 500mL boluses of fluid and monitor patient closely with long history of HFrEF with an EF of 25%  - Hold home furosemide  20mg  daily - Avoid nephrotoxic agents PAF (paroxysmal atrial fibrillation) (HCC) Currently in A-fib, rate controlled at 94bpm.   - Hold Xarelto  2/2 GI bleed  - Continue home carvedilol   12.5mg  BID and monitor for hypotension - SCDs for VTE ppx - Consider transition to Eliquis given lower rates of GI bleed when clinically stable and appropriate Type 2 diabetes mellitus treated with insulin  (HCC) Hgb A1C >10, 11/2023. - Sliding scale insulin  changed from moderate to sensitive - Lantus  20u at bedtime (takes 40u at bedtime at home) - CBG before meals and at bedtime - Hold home Mounjaro , glipizide  and Jardiance   Chronic health problem Hypothyroidism: TSH and T4 WNL BPH: Hold flomax , as patient not taking. Restart if retaining or LUTS sxs develop HLD:  Continue home rosuvastatin  40mg  daily and zetia  10mg  daily HTN: Continue home amlodipine  40 mg daily and home carvedilol  as above MSSA: ID consulted, recommended decreasing cefadroxil  from 1000mg  bid to 500mg  bid HFrEF: 12/23/2023 echo: inferior and inferoseptal akinesis with mod-sev LV dysfuntion. EF 30-35%. LV with mod decreased function and regional wall abnormalities consistent with Grade II DD. Hold home furosemide  as above in setting of AKI.      FEN/GI: NPO PPx: SCDs Dispo:Home pending clinical improvement . Barriers include downtrending Hb  Subjective:  Patient found with girlfriend at bedside sleeping before upcoming procedure. Patient states he is feeling less fatigued than previously and continues to deny any abdominal pain. He completed his prep with no complaints.   Objective: Temp:  [97.8 F (36.6 C)-98.2 F (36.8 C)] 97.9 F (36.6 C) (11/04 0553) Pulse Rate:  [86-90] 90 (11/04 0553) Resp:  [17-18] 17 (11/04 0553) BP: (108-144)/(61-85)  123/85 (11/04 0553) SpO2:  [96 %-98 %] 98 % (11/04 0553) Physical Exam Vitals reviewed.  Constitutional:      General: He is not in acute distress.    Appearance: He is normal weight. He is not ill-appearing.  Eyes:     Pupils: Pupils are equal, round, and reactive to light.  Cardiovascular:     Rate and Rhythm: Normal rate. Rhythm irregularly irregular.  Pulmonary:      Effort: Pulmonary effort is normal.  Abdominal:     Palpations: Abdomen is soft.     Tenderness: There is no abdominal tenderness.  Skin:    Coloration: Skin is pale.  Neurological:     Mental Status: He is alert.      Laboratory: Most recent CBC Lab Results  Component Value Date   WBC 5.9 02/20/2024   HGB 8.0 (L) 02/20/2024   HCT 25.9 (L) 02/20/2024   MCV 84.6 02/20/2024   PLT 246 02/20/2024   Most recent BMP    Latest Ref Rng & Units 02/20/2024    5:37 AM  BMP  Glucose 70 - 99 mg/dL 889   BUN 8 - 23 mg/dL 21   Creatinine 9.38 - 1.24 mg/dL 8.25   Sodium 864 - 854 mmol/L 138   Potassium 3.5 - 5.1 mmol/L 3.7   Chloride 98 - 111 mmol/L 104   CO2 22 - 32 mmol/L 24   Calcium  8.9 - 10.3 mg/dL 8.3     Other pertinent labs: Iron Studies: Iron 39, Saturation ratios 13, TIBC WNL, Ferritin WNL, Retic Ct 4.7, RBC 3.49  Imaging/Diagnostic Tests: None  Elodie Palma, MD 02/20/2024, 7:11 AM  PGY-1, [place] [facility] FPTS Intern pager: [phone], text pages welcome Secure chat group [facility] Teaching Service

## 2024-02-20 NOTE — Assessment & Plan Note (Deleted)
 On problem list however patient not on Synthroid at this time.  - TSH and T4 WNL

## 2024-02-20 NOTE — Plan of Care (Signed)
  Problem: Nutritional: Goal: Maintenance of adequate nutrition will improve Outcome: Progressing   Problem: Skin Integrity: Goal: Risk for impaired skin integrity will decrease Outcome: Progressing   Problem: Nutrition: Goal: Adequate nutrition will be maintained Outcome: Progressing   Problem: Elimination: Goal: Will not experience complications related to urinary retention Outcome: Progressing   Problem: Pain Managment: Goal: General experience of comfort will improve and/or be controlled Outcome: Progressing   Problem: Skin Integrity: Goal: Risk for impaired skin integrity will decrease Outcome: Progressing

## 2024-02-20 NOTE — Assessment & Plan Note (Addendum)
 Hgb 7.2 on admission. 2 transfusions of PRBC increased Hb to 8.6 on 11/3. Anemia most likely in the setting of aspirin  use, Xarelto  use, sigmoidectomy with colostomy with takedown 06/2023. - GI consulted, recommend to continue serial Hb and transfuse to keep Hb 7.5 - 8 - F/u GI pending results of EGD and colonoscopy - Hold Xarelto  per GI - F/u GI about when to restart Aspirin  - IV Protonix  40mg  BID - CBC q8

## 2024-02-20 NOTE — Assessment & Plan Note (Signed)
 Hgb A1C >10, 11/2023. - Sliding scale insulin  changed from moderate to sensitive - Lantus  20u at bedtime (takes 40u at bedtime at home) - CBG before meals and at bedtime - Hold home Mounjaro , glipizide  and Jardiance 

## 2024-02-20 NOTE — Progress Notes (Signed)
 [name] gave report and instruction on the capsule protocol. Receive pt from ENDO via wheelchair. Pt place in bed with family at bedside. Floor mats down. Educated family. Will continue with care plan at this time.

## 2024-02-20 NOTE — Plan of Care (Signed)
 FMTS Interim Progress Note  S: Evaluated patient post EGD and colonoscopy, doing well. Clear fluid diets, full diet will resume at 2015.   O: BP 119/69 (BP Location: Left Arm)   Pulse 82   Temp 98.1 F (36.7 C)   Resp 16   Ht 5' 7 (1.702 m)   Wt 81.2 kg   SpO2 95%   BMI 28.04 kg/m     A/P: - Per GI, continue current medication regimen - f/u with GI about when to restart aspirin   Elodie Palma, MD 02/20/2024, 2:50 PM PGY-1, [facility] Family Medicine Service pager [phone]

## 2024-02-20 NOTE — Plan of Care (Signed)
  Problem: Health Behavior/Discharge Planning: Goal: Ability to manage health-related needs will improve Outcome: Progressing   Problem: Health Behavior/Discharge Planning: Goal: Ability to manage health-related needs will improve Outcome: Progressing   

## 2024-02-20 NOTE — Assessment & Plan Note (Deleted)
 104/88 on admission. - Continue home amlodipine  5mg  daily - Continue home carvedilol  as above

## 2024-02-20 NOTE — Interval H&P Note (Signed)
 History and Physical Interval Note: For EGD and colonoscopy today to evaluate recurrent anemia and rectal bleeding Tolerated the prep. The nature of the procedure, as well as the risks, benefits, and alternatives were carefully and thoroughly reviewed with the patient. Ample time for discussion and questions allowed. The patient understood, was satisfied, and agreed to proceed.      Latest Ref Rng & Units 02/20/2024    5:37 AM 02/19/2024    8:39 PM 02/19/2024   12:29 PM  CBC  WBC 4.0 - 10.5 K/uL 5.9  5.8  5.6   Hemoglobin 13.0 - 17.0 g/dL 8.0  8.2  8.6   Hematocrit 39.0 - 52.0 % 25.9  26.6  27.3   Platelets 150 - 400 K/uL 246  228  253    Lab Results  Component Value Date   INR 1.1 02/20/2024   INR 1.8 (H) 02/18/2024   INR 2.2 (H) 08/25/2023     02/20/2024 10:23 AM  [name]  has presented today for surgery, with the diagnosis of Recurrent anemia, rectal bleeding,.  The various methods of treatment have been discussed with the patient and family. After consideration of risks, benefits and other options for treatment, the patient has consented to  Procedure(s): EGD (ESOPHAGOGASTRODUODENOSCOPY) (N/A) COLONOSCOPY (N/A) as a surgical intervention.  The patient's history has been reviewed, patient examined, no change in status, stable for surgery.  I have reviewed the patient's chart and labs.  Questions were answered to the patient's satisfaction.     [name]

## 2024-02-20 NOTE — Assessment & Plan Note (Deleted)
 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 Treated with 6 weeks of IV cefazolin  prior to oral abx. Oral cefadroxil  started 10/11/2023 and set to continue for 4.5 months, culminating approximately in mid November 2025. - ID consulted, recommended decreasing cefadroxil  from 1000mg  bid to 500mg  bid - Patient has f/u with ID in 4 weeks - Appreciate ID for recs

## 2024-02-20 NOTE — Op Note (Signed)
 [facility] Patient Name: [name] Procedure Date : 02/20/2024 MRN: [id number] Attending MD: [name] CHRISTELLA Starch , MD, [id number] Date of Birth: 1945/11/10 CSN: [id number] Age: 78 Admit Type: Inpatient Procedure:                Upper GI endoscopy Indications:              Anemia secondary to chronic blood loss, recurrent                            rectal bleeding Providers:                [name] CHRISTELLA. Starch, MD, Ozell Pouch, Coye Bade, Technician Referring MD:             [facility] Medicine Teaching Service Medicines:                Monitored Anesthesia Care Complications:            No immediate complications. Estimated Blood Loss:     Estimated blood loss: none. Procedure:                Pre-Anesthesia Assessment:                           - Prior to the procedure, a History and Physical                            was performed, and patient medications and                            allergies were reviewed. The patient's tolerance of                            previous anesthesia was also reviewed. The risks                            and benefits of the procedure and the sedation                            options and risks were discussed with the patient.                            All questions were answered, and informed consent                            was obtained. Prior Anticoagulants: The patient has                            taken no anticoagulant or antiplatelet agents. ASA                            Grade Assessment: III - A patient with severe  systemic disease. After reviewing the risks and                            benefits, the patient was deemed in satisfactory                            condition to undergo the procedure.                           After obtaining informed consent, the endoscope was                            passed under direct vision. Throughout the                             procedure, the patient's blood pressure, pulse, and                            oxygen saturations were monitored continuously. The                            [id number]-[id number] ([id number]) Olympus endoscope was introduced                            through the mouth, and advanced to the second part                            of duodenum. Scope In: Scope Out: Findings:      The examined esophagus was normal.      Three small angioectasias with no bleeding were found in the gastric       fundus and in the gastric body. Fulguration to ablate the lesion to       prevent bleeding by argon plasma at 1 liter/minute and 20 watts was       successful.      The exam of the stomach was otherwise normal.      The examined duodenum was normal. Impression:               - Normal esophagus.                           - Three non-bleeding angioectasias in the stomach.                            Treated with argon plasma coagulation (APC).                           - Normal examined duodenum.                           - No specimens collected. Moderate Sedation:      N/A Recommendation:           - Return patient to hospital ward for ongoing care.                           -  NPO.                           - Continue present medications.                           - See the other procedure note for documentation of                            additional recommendations. Procedure Code(s):        --- Professional ---                           [phone], Esophagogastroduodenoscopy, flexible,                            transoral; with control of bleeding, any method Diagnosis Code(s):        --- Professional ---                           K31.819, Angiodysplasia of stomach and duodenum                            without bleeding                           D50.0, Iron deficiency anemia secondary to blood                            loss (chronic) CPT copyright 2022 [employer]. All rights reserved. The  codes documented in this report are preliminary and upon coder review may  be revised to meet current compliance requirements. [name] CHRISTELLA Starch, MD 02/20/2024 11:32:10 AM This report has been signed electronically. Number of Addenda: 0

## 2024-02-20 NOTE — Assessment & Plan Note (Signed)
 Currently in A-fib, rate controlled at 94bpm.   - Hold Xarelto  2/2 GI bleed  - Continue home carvedilol  12.5mg  BID and monitor for hypotension - SCDs for VTE ppx - Consider transition to Eliquis given lower rates of GI bleed when clinically stable and appropriate

## 2024-02-20 NOTE — Op Note (Signed)
 [facility] Patient Name: [name] Procedure Date : 02/20/2024 MRN: [id number] Attending MD: [name] CHRISTELLA Starch , MD, [id number] Date of Birth: 08-28-1945 CSN: [id number] Age: 78 Admit Type: Inpatient Procedure:                Colonoscopy Indications:              Rectal bleeding, Acute post hemorrhagic anemia Providers:                [name] CHRISTELLA. Starch, MD, Ozell Pouch, Coye Bade, Technician Referring MD:             [facility] Medicine Teaching Service Medicines:                Monitored Anesthesia Care Complications:            No immediate complications. Estimated Blood Loss:     Estimated blood loss: none. Procedure:                Pre-Anesthesia Assessment:                           - Prior to the procedure, a History and Physical                            was performed, and patient medications and                            allergies were reviewed. The patient's tolerance of                            previous anesthesia was also reviewed. The risks                            and benefits of the procedure and the sedation                            options and risks were discussed with the patient.                            All questions were answered, and informed consent                            was obtained. Prior Anticoagulants: The patient has                            taken Xarelto  (rivaroxaban ), last dose was 3 days                            prior to procedure. ASA Grade Assessment: III - A                            patient with severe systemic disease. After  reviewing the risks and benefits, the patient was                            deemed in satisfactory condition to undergo the                            procedure.                           After obtaining informed consent, the colonoscope                            was passed under direct vision. Throughout the                             procedure, the patient's blood pressure, pulse, and                            oxygen saturations were monitored continuously. The                            PCF-HQ190L ([id number]) Olympus colonoscope was                            introduced through the anus and advanced to the 10                            cm into the ileum. The colonoscopy was performed                            without difficulty. The patient tolerated the                            procedure well. The quality of the bowel                            preparation was good. The terminal ileum, ileocecal                            valve, appendiceal orifice, and rectum were                            photographed. Scope In: 11:05:22 AM Scope Out: 11:27:41 AM Scope Withdrawal Time: 0 hours 20 minutes 22 seconds  Total Procedure Duration: 0 hours 22 minutes 19 seconds  Findings:      The digital rectal exam was normal.      The distal ileum contained red blood.      Hematin (altered blood/coffee-ground-like material) was found in the       ascending colon and in the cecum.      Multiple small-mouthed diverticula were found in the descending colon,       ascending colon and cecum.      Three sessile polyps were found in the ascending colon. The polyps were       4 to 9 mm in size. These polyps  were removed with a cold snare.       Resection and retrieval were complete.      A 6 mm polyp was found in the transverse colon. The polyp was sessile.       The polyp was removed with a cold snare. Resection and retrieval were       complete.      There was evidence of a prior end-to-end colo-colonic anastomosis in the       sigmoid colon. This was patent and was characterized by an intact staple       line.      A 6 mm polyp was found in the distal sigmoid colon. The polyp was       semi-pedunculated and inflamed appearing, located very close to a       visible staple. The polyp was removed with a hot snare. Resection and        retrieval were complete.      The retroflexed view of the distal rectum and anal verge was normal and       showed no anal or rectal abnormalities. Impression:               - Blood (red and coffee ground) in the distal ileum.                           - Blood (coffee-ground) in the ascending colon and                            in the cecum.                           - Mild diverticulosis in the descending colon, in                            the ascending colon and in the cecum.                           - Three 4 to 9 mm polyps in the ascending colon,                            removed with a cold snare. Resected and retrieved.                           - One 6 mm polyp in the transverse colon, removed                            with a cold snare. Resected and retrieved.                           - Patent end-to-end colo-colonic anastomosis,                            characterized by an intact staple line.                           - One 6 mm polyp in the distal sigmoid colon just  distal to anastomosis, removed with a hot snare.                            Resected and retrieved.                           - The distal rectum and anal verge are normal on                            retroflexion view. Moderate Sedation:      N/A Recommendation:           - Return patient to hospital ward for ongoing care.                           - NPO. See video capsule diet instructions.                           - Video Capsule Endoscopy now to attempt to locate                            source of small bowel bleeding.                           - Continue present medications.                           - Await pathology results.                           - No recommendation at this time regarding repeat                            colonoscopy. Procedure Code(s):        --- Professional ---                           [phone], Colonoscopy, flexible; with removal of                             tumor(s), polyp(s), or other lesion(s) by snare                            technique Diagnosis Code(s):        --- Professional ---                           K92.2, Gastrointestinal hemorrhage, unspecified                           D12.2, Benign neoplasm of ascending colon                           D12.3, Benign neoplasm of transverse colon (hepatic                            flexure or splenic flexure)  D12.5, Benign neoplasm of sigmoid colon                           Z98.0, Intestinal bypass and anastomosis status                           K62.5, Hemorrhage of anus and rectum                           D62, Acute posthemorrhagic anemia                           K57.30, Diverticulosis of large intestine without                            perforation or abscess without bleeding CPT copyright 2022 American [employer] Association. All rights reserved. The codes documented in this report are preliminary and upon coder review may  be revised to meet current compliance requirements. [name] CHRISTELLA Starch, MD 02/20/2024 11:39:39 AM This report has been signed electronically. Number of Addenda: 0

## 2024-02-20 NOTE — Anesthesia Preprocedure Evaluation (Addendum)
 Anesthesia Evaluation  Patient identified by MRN, date of birth, ID band Patient awake    Reviewed: Allergy & Precautions, NPO status , Patient's Chart, lab work & pertinent test results, reviewed documented beta blocker date and time   Airway Mallampati: II  TM Distance: >3 FB Neck ROM: Full    Dental  (+) Dental Advisory Given, Edentulous Upper, Missing, Partial Lower, Upper Dentures   Pulmonary former smoker   Pulmonary exam normal breath sounds clear to auscultation       Cardiovascular hypertension, Pt. on home beta blockers and Pt. on medications + angina  + CAD, + Past MI, + Cardiac Stents, + Peripheral Vascular Disease (status post bilateral aortobifemoral bypass in1992 with recent fem to fembypass April  2011) and +CHF  Normal cardiovascular exam+ dysrhythmias Atrial Fibrillation and Ventricular Tachycardia  Rhythm:Regular Rate:Normal  Echo 12/23/23:  1. Inferior and inferoseptal akinesis with overall moderate to severe LV  dysfunction.   2. Left ventricular ejection fraction, by estimation, is 30 to 35%. The  left ventricle has moderately decreased function. The left ventricle  demonstrates regional wall motion abnormalities (see scoring  diagram/findings for description). Left ventricular   diastolic parameters are consistent with Grade II diastolic dysfunction  (pseudonormalization). Elevated left atrial pressure.   3. Right ventricular systolic function is normal. The right ventricular  size is normal.   4. Left atrial size was moderately dilated.   5. The mitral valve is normal in structure. Trivial mitral valve  regurgitation. No evidence of mitral stenosis.   6. The aortic valve is tricuspid. Aortic valve regurgitation is not  visualized. Aortic valve sclerosis/calcification is present, without any  evidence of aortic stenosis.   7. The inferior vena cava is normal in size with greater than 50%  respiratory  variability, suggesting right atrial pressure of 3 mmHg.   8. Cannot exclude a small PFO.     Neuro/Psych  PSYCHIATRIC DISORDERS  Depression    negative neurological ROS     GI/Hepatic Neg liver ROS,GERD  Medicated,,  Endo/Other  diabetes, Type 2, Oral Hypoglycemic Agents, Insulin  DependentHypothyroidism    Renal/GU Renal InsufficiencyRenal disease   Bladder cancer     Musculoskeletal  (+) Arthritis ,    Abdominal   Peds  Hematology  (+) Blood dyscrasia, anemia   Anesthesia Other Findings Day of surgery medications reviewed with the patient.  Reproductive/Obstetrics                              Anesthesia Physical Anesthesia Plan  ASA: 4  Anesthesia Plan: MAC   Post-op Pain Management:    Induction: Intravenous  PONV Risk Score and Plan: 1 and TIVA and Treatment may vary due to age or medical condition  Airway Management Planned: Natural Airway and Simple Face Mask  Additional Equipment:   Intra-op Plan:   Post-operative Plan:   Informed Consent: I have reviewed the patients History and Physical, chart, labs and discussed the procedure including the risks, benefits and alternatives for the proposed anesthesia with the patient or authorized representative who has indicated his/her understanding and acceptance.     Dental advisory given  Plan Discussed with: CRNA and Anesthesiologist  Anesthesia Plan Comments:          Anesthesia Quick Evaluation

## 2024-02-20 NOTE — Assessment & Plan Note (Deleted)
 Reported intermittent rectal bleeding s/p takedown of colostomy 06/2023. Hb on admission 7.2, MCV 84, platelet count normal. Previous Hb was 10.2 6 weeks ago. Stools heme positive - Hold Xarelto   - Upper and Lower endoscopy scheduled @1100  - f/u GI recs pending EGD and colonoscopy results - NPO - Hb continues to downtrend s/p 2 units of packed RBCs 8.6 -- 8.2 -- 8.0, threshold per GI 7.5 - Appreciate GI recs

## 2024-02-20 NOTE — Transfer of Care (Signed)
 Immediate Anesthesia Transfer of Care Note  Patient: [name]  Procedure(s) Performed: EGD (ESOPHAGOGASTRODUODENOSCOPY) COLONOSCOPY EGD, WITH ARGON PLASMA COAGULATION IMAGING PROCEDURE, GI TRACT, INTRALUMINAL, VIA CAPSULE  Patient Location: Endoscopy Unit  Anesthesia Type:MAC  Level of Consciousness: drowsy and patient cooperative  Airway & Oxygen Therapy: Patient Spontanous Breathing  Post-op Assessment: Report given to RN and Post -op Vital signs reviewed and stable  Post vital signs: Reviewed and stable  Last Vitals:  Vitals Value Taken Time  BP 125/68 02/20/24 11:33  Temp    Pulse 85 02/20/24 11:35  Resp    SpO2 93 % 02/20/24 11:35  Vitals shown include unfiled device data.  Last Pain:  Vitals:   02/20/24 1133  TempSrc:   PainSc: Asleep         Complications: No notable events documented.

## 2024-02-20 NOTE — Assessment & Plan Note (Deleted)
-   Continue home rosuvastatin  40mg  daily  - Continue home Zetia  10mg  daily

## 2024-02-21 ENCOUNTER — Ambulatory Visit: Payer: Self-pay | Admitting: Internal Medicine

## 2024-02-21 ENCOUNTER — Ambulatory Visit: Admitting: Internal Medicine

## 2024-02-21 DIAGNOSIS — K5521 Angiodysplasia of colon with hemorrhage: Secondary | ICD-10-CM | POA: Diagnosis not present

## 2024-02-21 DIAGNOSIS — D62 Acute posthemorrhagic anemia: Secondary | ICD-10-CM | POA: Diagnosis not present

## 2024-02-21 DIAGNOSIS — K922 Gastrointestinal hemorrhage, unspecified: Secondary | ICD-10-CM | POA: Diagnosis not present

## 2024-02-21 LAB — CBC
HCT: 28.5 % — ABNORMAL LOW (ref 39.0–52.0)
HCT: 29.9 % — ABNORMAL LOW (ref 39.0–52.0)
Hemoglobin: 8.8 g/dL — ABNORMAL LOW (ref 13.0–17.0)
Hemoglobin: 8.9 g/dL — ABNORMAL LOW (ref 13.0–17.0)
MCH: 26.5 pg (ref 26.0–34.0)
MCH: 26.2 pg (ref 26.0–34.0)
MCHC: 30.9 g/dL (ref 30.0–36.0)
MCHC: 29.8 g/dL — ABNORMAL LOW (ref 30.0–36.0)
MCV: 85.8 fL (ref 80.0–100.0)
MCV: 87.9 fL (ref 80.0–100.0)
Platelets: 258 K/uL (ref 150–400)
Platelets: 232 K/uL (ref 150–400)
RBC: 3.32 MIL/uL — ABNORMAL LOW (ref 4.22–5.81)
RBC: 3.4 MIL/uL — ABNORMAL LOW (ref 4.22–5.81)
RDW: 24.4 % — ABNORMAL HIGH (ref 11.5–15.5)
RDW: 24 % — ABNORMAL HIGH (ref 11.5–15.5)
WBC: 6.4 K/uL (ref 4.0–10.5)
WBC: 6.6 K/uL (ref 4.0–10.5)
nRBC: 0 % (ref 0.0–0.2)
nRBC: 0 % (ref 0.0–0.2)

## 2024-02-21 LAB — GLUCOSE, CAPILLARY
Glucose-Capillary: 134 mg/dL — ABNORMAL HIGH (ref 70–99)
Glucose-Capillary: 167 mg/dL — ABNORMAL HIGH (ref 70–99)
Glucose-Capillary: 180 mg/dL — ABNORMAL HIGH (ref 70–99)
Glucose-Capillary: 285 mg/dL — ABNORMAL HIGH (ref 70–99)

## 2024-02-21 LAB — BASIC METABOLIC PANEL WITH GFR
Anion gap: 11 (ref 5–15)
BUN: 13 mg/dL (ref 8–23)
CO2: 23 mmol/L (ref 22–32)
Calcium: 8.6 mg/dL — ABNORMAL LOW (ref 8.9–10.3)
Chloride: 104 mmol/L (ref 98–111)
Creatinine, Ser: 1.65 mg/dL — ABNORMAL HIGH (ref 0.61–1.24)
GFR, Estimated: 42 mL/min — ABNORMAL LOW (ref >60)
Glucose, Bld: 147 mg/dL — ABNORMAL HIGH (ref 70–99)
Potassium: 4 mmol/L (ref 3.5–5.1)
Sodium: 138 mmol/L (ref 135–145)

## 2024-02-21 LAB — SURGICAL PATHOLOGY

## 2024-02-21 MED ORDER — OCTREOTIDE ACETATE 50 MCG/ML IJ SOLN
50.0000 ug | Freq: Two times a day (BID) | INTRAMUSCULAR | Status: DC
  Administered 2024-02-21 – 2024-02-24 (×6): 50 ug via SUBCUTANEOUS
  Filled 2024-02-21 (×8): qty 1

## 2024-02-21 MED ORDER — ASPIRIN 81 MG PO TBEC
81.0000 mg | DELAYED_RELEASE_TABLET | Freq: Every day | ORAL | Status: DC
  Administered 2024-02-21 – 2024-02-25 (×5): 81 mg via ORAL
  Filled 2024-02-21 (×5): qty 1

## 2024-02-21 MED ORDER — APIXABAN 2.5 MG PO TABS
2.5000 mg | ORAL_TABLET | Freq: Two times a day (BID) | ORAL | Status: DC
  Administered 2024-02-21 – 2024-02-23 (×4): 2.5 mg via ORAL
  Filled 2024-02-21 (×4): qty 1

## 2024-02-21 NOTE — Assessment & Plan Note (Signed)
 Currently in A-fib, rate controlled at 94bpm.   - Hold Xarelto  2/2 GI bleed  - Continue home carvedilol  12.5mg  BID and monitor for hypotension - SCDs for VTE ppx - Consider transition to Eliquis given lower rates of GI bleed when clinically stable and appropriate

## 2024-02-21 NOTE — Plan of Care (Signed)
   Problem: Elimination: Goal: Will not experience complications related to bowel motility Outcome: Progressing   Problem: Pain Managment: Goal: General experience of comfort will improve and/or be controlled Outcome: Progressing   Problem: Safety: Goal: Ability to remain free from injury will improve Outcome: Progressing

## 2024-02-21 NOTE — Assessment & Plan Note (Signed)
 Cr improving, unknown baseline with Stage 3b CKD. Suspect 2/2 pre-renal/blood loss - Receiving 500mL bolus with PRBC transfusions. Transfusion Hb threshold 7.5 - 8 via GI - Consider spot dosing 500mL boluses of fluid and monitor patient closely with long history of HFrEF with an EF of 25%  - Hold home furosemide  20mg  daily - Avoid nephrotoxic agents

## 2024-02-21 NOTE — Plan of Care (Addendum)
 Spoke with VSS on-call.  Briefly discussed patient's current hospitalization regarding GI bleed and plan for switching from Xarelto  to Eliquis for anticoagulation.  Per VSS, recommend also continuing aspirin  at this time given history of stent/graft. Orders updated.  Greatly appreciate VSS input and care guidance.

## 2024-02-21 NOTE — Anesthesia Postprocedure Evaluation (Signed)
 Anesthesia Post Note  Patient: DAELIN HASTE  Procedure(s) Performed: EGD (ESOPHAGOGASTRODUODENOSCOPY) COLONOSCOPY EGD, WITH ARGON PLASMA COAGULATION IMAGING PROCEDURE, GI TRACT, INTRALUMINAL, VIA CAPSULE     Patient location during evaluation: PACU Anesthesia Type: MAC Level of consciousness: awake and alert Pain management: pain level controlled Vital Signs Assessment: post-procedure vital signs reviewed and stable Respiratory status: spontaneous breathing, nonlabored ventilation and respiratory function stable Cardiovascular status: blood pressure returned to baseline and stable Postop Assessment: no apparent nausea or vomiting Anesthetic complications: no   No notable events documented.  Last Vitals:  Vitals:   02/21/24 0603 02/21/24 0851  BP: 101/64 138/64  Pulse: 84   Resp: 17   Temp: 36.9 C   SpO2: 95%     Last Pain:  Vitals:   02/21/24 0859  TempSrc:   PainSc: 0-No pain                 Butler Levander Pinal

## 2024-02-21 NOTE — Assessment & Plan Note (Addendum)
 EGD (11/4) showed 3 non-bleeding angiectasias, treated in the stomach. Colonoscopy (11/4) showed blood in the colon with multiple polyps removed. Hb improving since EGD and colonoscopy. Anemia most likely in the setting of aspirin  and Xarelto  use with angiectasis and sigmoidectomy with colostomy with takedown 06/2023.  - f/u video capsule - GI consulted, recommend to continue serial Hb and transfuse to keep Hb 7.5 - 8 - F/u GI about when to restart Aspirin  and Xarelto  - IV Protonix  40mg  BID - CBC q12 - IV iron

## 2024-02-21 NOTE — Assessment & Plan Note (Signed)
 Hgb A1C >10, 11/2023. - Sliding scale insulin  changed from moderate to sensitive - Lantus  20u at bedtime (takes 40u at bedtime at home) - CBG before meals and at bedtime - Hold home Mounjaro , glipizide  and Jardiance 

## 2024-02-21 NOTE — Assessment & Plan Note (Signed)
 Hypothyroidism: TSH and T4 WNL BPH: Hold flomax , as patient not taking. Restart if retaining or LUTS sxs develop HLD:  Continue home rosuvastatin  40mg  daily and zetia  10mg  daily HTN: Continue home amlodipine  40 mg daily and home carvedilol  as above MSSA: ID consulted, recommended decreasing cefadroxil  from 1000mg  bid to 500mg  bid HFrEF: 12/23/2023 echo: inferior and inferoseptal akinesis with mod-sev LV dysfuntion. EF 30-35%. LV with mod decreased function and regional wall abnormalities consistent with Grade II DD. Hold home furosemide  as above in setting of AKI.

## 2024-02-21 NOTE — Progress Notes (Addendum)
 Progress Note   Assessment    78 year old male with a history of PAD/PVD prior grafting and recent angioplasty complicated by infection, chronic CHF, CKD, PAF on Xarelto , diverticulitis status post resection earlier this year with subsequent reanastomosis, MSSA infection on antibiotics from right SFA graft admitted with recurrent GI blood loss found to have a gastric and distal small bowel angioectasias  Principal Problem:   Acute GI bleeding Active Problems:   Hyperlipidemia   PAF (paroxysmal atrial fibrillation) (HCC)   AKI (acute kidney injury)   Hypothyroidism   Type 2 diabetes mellitus treated with insulin  (HCC)   HTN (hypertension)   Chronic HFrEF (heart failure with reduced ejection fraction) (HCC)   CKD stage 3b, GFR 30-44 ml/min (HCC)   BPH (benign prostatic hyperplasia)   MSSA (methicillin susceptible Staphylococcus aureus) infection   Anemia due to blood loss   Pressure injury of skin   GI bleed   Rectal bleeding   Benign neoplasm of ascending colon   Benign neoplasm of transverse colon   Benign neoplasm of sigmoid colon   Gastric and duodenal angiodysplasia   Chronic health problem   Recommendations   1.  Recurrent GI bleed/gastric and small bowel angioectasias --3 angioectasias ablated in the stomach yesterday.  Colonoscopy revealed old blood in the right colon and more fresh blood in the terminal ileum.  Video capsule endoscopy study read today which shows fresh and old blood in the distal small bowel.  No discrete small bowel lesion seen, though likely angioectasias obscured by presence of blood.  No large lesions, polyps or ulcer seen. Video capsule results discussed with the patient and his girlfriend at length --Would recommend changing DOAC from Xarelto  to Eliquis; anecdotally Eliquis has been associated with less GI bleeding -- Aspirin  previously used but would discuss with vascular surgery whether this is required given bleeding AVMs. -- Subcutaneous  octreotide every 12 hours (short acting) to ensure he can tolerate and then if insurance would allow Depo octreotide can reduce the risk of recurrent angioectasia bleeding -- He will need very close monitoring of hemoglobin while here and then after discharge.  He lives about 10 minutes from a [facility] station where he could have blood work done.  I expect he would need this at least weekly until we know it is stable -- In the event that he has recurrent bleeding and anemia then he should be referred to see Dr. Rolan Clas at [facility] for consideration of double-balloon enteroscopy, perhaps retrograde enteroscopy.  2.  Colonic polyps --all but one of the polyps are adenomatous.  No high-grade dysplasia or malignancy.  Surveillance colonoscopy likely not needed based on age; neck surveillance interval would be 3 years.  Can defer decision until that time.  Discussed with primary team and GI will sign off but be available, call if questions  35 minutes total spent today including patient facing time, coordination of care, reviewing medical history/procedures/pertinent radiology studies, and documentation of the encounter.    Chief Complaint   Patient feels as good as he has in some time No abdominal pain Scant blood with bowel movement this morning Girlfriend at bedside   Vital signs in last 24 hours: Temp:  [98.2 F (36.8 C)-98.9 F (37.2 C)] 98.2 F (36.8 C) (11/05 1118) Pulse Rate:  [84-91] 91 (11/05 1118) Resp:  [16-18] 17 (11/05 1118) BP: (101-138)/(37-64) 117/62 (11/05 1118) SpO2:  [94 %-96 %] 96 % (11/05 1118) Last BM Date : 02/19/24 Gen: awake, alert, NAD HEENT:  anicteric  Abd: soft, NT/ND, +BS throughout Ext: no c/c/e Neuro: nonfocal  Intake/Output from previous day: 11/04 0701 - 11/05 0700 In: 1083.8 [P.O.:1078; I.V.:5.8] Out: 975 [Urine:975] Intake/Output this shift: Total I/O In: 340 [P.O.:340] Out: -   Lab Results: Recent Labs    02/20/24 1322 02/20/24 2050  02/21/24 0757  WBC 5.8 6.8 6.4  HGB 8.2* 8.3* 8.8*  HCT 26.2* 27.1* 28.5*  PLT 249 256 258   BMET Recent Labs    02/19/24 0356 02/20/24 0537 02/21/24 0757  NA 136 138 138  K 4.0 3.7 4.0  CL 104 104 104  CO2 23 24 23   GLUCOSE 184* 110* 147*  BUN 30* 21 13  CREATININE 1.78* 1.74* 1.65*  CALCIUM  8.2* 8.3* 8.6*   LFT Recent Labs    02/18/24 1658  PROT 5.7*  ALBUMIN  3.1*  AST 15  ALT 11  ALKPHOS 50  BILITOT 0.6   PT/INR Recent Labs    02/18/24 1634 02/20/24 0537  LABPROT 21.8* 14.9  INR 1.8* 1.1     LOS: 2 days   [name] CHRISTELLA Starch, MD 02/21/2024, 3:07 PM See [name], [facility], to contact our on call provider

## 2024-02-21 NOTE — Plan of Care (Signed)
  Problem: Pain Managment: Goal: General experience of comfort will improve and/or be controlled Outcome: Progressing   Problem: Safety: Goal: Ability to remain free from injury will improve Outcome: Progressing   Problem: Skin Integrity: Goal: Risk for impaired skin integrity will decrease Outcome: Progressing

## 2024-02-21 NOTE — Progress Notes (Signed)
 Daily Progress Note Intern Pager: [phone]  Patient name: [name] Medical record number: [id number] Date of birth: 16-Jun-1945 Age: 78 y.o. Gender: male  Primary Care Provider: Fleeta Finger, Selinda, MD Consultants: GI, ID Code Status: Full code without life prolonging measures, AD on file   Pt Overview and Major Events to Date:  11/3: Admitted to FMTS, Hb 7 on admission. 2 units of PRBCs with Hb improving from 7 -- 7.2 -- 8.6 11/4: Hb continues to downtrend, CBCq8 Hb 8.6 - 8.2 - 8.0. EGD and Colonoscopy completed, video capsule study completed, awaiting results  Assessment and Plan: [name] is a 78 y.o. male with a PMHx of PAD/PVD, chronic CHF, CKD Stage 3b, PAF on Xarelto , hx of bladder cancer, diverticulitis s/p takedown 06/2023, CAD, ischemic cardiomyopathy with EF of 30-35%, R SFA graft with MSSA infection on oral antibiotics, admitted for acute anemia secondary to GI bleed. Assessment & Plan Anemia due to blood loss GI bleed EGD (11/4) showed 3 non-bleeding angiectasias, treated in the stomach. Colonoscopy (11/4) showed blood in the colon with multiple polyps removed. Hb improving since EGD and colonoscopy. Anemia most likely in the setting of aspirin  and Xarelto  use with angiectasis and sigmoidectomy with colostomy with takedown 06/2023.  - f/u video capsule - GI consulted, recommend to continue serial Hb and transfuse to keep Hb 7.5 - 8 - F/u GI about when to restart Aspirin  and Xarelto  - IV Protonix  40mg  BID - CBC q12 - IV iron AKI (acute kidney injury) CKD stage 3b, GFR 30-44 ml/min (HCC) Cr improving, unknown baseline with Stage 3b CKD. Suspect 2/2 pre-renal/blood loss - Receiving 500mL bolus with PRBC transfusions. Transfusion Hb threshold 7.5 - 8 via GI - Consider spot dosing 500mL boluses of fluid and monitor patient closely with long history of HFrEF with an EF of 25%  - Hold home furosemide  20mg  daily - Avoid nephrotoxic agents PAF (paroxysmal atrial  fibrillation) (HCC) Currently in A-fib, rate controlled at 94bpm.   - Hold Xarelto  2/2 GI bleed  - Continue home carvedilol  12.5mg  BID and monitor for hypotension - SCDs for VTE ppx - Consider transition to Eliquis given lower rates of GI bleed when clinically stable and appropriate Type 2 diabetes mellitus treated with insulin  (HCC) Hgb A1C >10, 11/2023. - Sliding scale insulin  changed from moderate to sensitive - Lantus  20u at bedtime (takes 40u at bedtime at home) - CBG before meals and at bedtime - Hold home Mounjaro , glipizide  and Jardiance   Chronic health problem Hypothyroidism: TSH and T4 WNL BPH: Hold flomax , as patient not taking. Restart if retaining or LUTS sxs develop HLD:  Continue home rosuvastatin  40mg  daily and zetia  10mg  daily HTN: Continue home amlodipine  40 mg daily and home carvedilol  as above MSSA: ID consulted, recommended decreasing cefadroxil  from 1000mg  bid to 500mg  bid HFrEF: 12/23/2023 echo: inferior and inferoseptal akinesis with mod-sev LV dysfuntion. EF 30-35%. LV with mod decreased function and regional wall abnormalities consistent with Grade II DD. Hold home furosemide  as above in setting of AKI.      FEN/GI: Carb modified diet PPx: SCDs Dispo:Home pending clinical improvement    Subjective:  Patient found in bed resting comfortably. Patient continues to feel less fatigued and continues to deny abdominal pain. Patient states they collected his capsule this morning.   Objective: Temp:  [97.1 F (36.2 C)-98.9 F (37.2 C)] 98.4 F (36.9 C) (11/05 0603) Pulse Rate:  [76-90] 84 (11/05 0603) Resp:  [14-18] 17 (11/05 0603)  BP: (101-138)/(37-77) 138/64 (11/05 0851) SpO2:  [94 %-97 %] 95 % (11/05 0603)  Physical Exam Constitutional:      General: He is not in acute distress.    Appearance: Normal appearance.  Cardiovascular:     Rate and Rhythm: Rhythm irregularly irregular.  Pulmonary:     Effort: Pulmonary effort is normal.  Abdominal:      General: There is no distension.     Palpations: Abdomen is soft.     Tenderness: There is no abdominal tenderness.  Skin:    General: Skin is warm and dry.     Coloration: Skin is pale.  Neurological:     Mental Status: He is alert.     Laboratory: Most recent CBC Lab Results  Component Value Date   WBC 6.4 02/21/2024   HGB 8.8 (L) 02/21/2024   HCT 28.5 (L) 02/21/2024   MCV 85.8 02/21/2024   PLT 258 02/21/2024   Most recent BMP    Latest Ref Rng & Units 02/21/2024    7:57 AM  BMP  Glucose 70 - 99 mg/dL 852   BUN 8 - 23 mg/dL 13   Creatinine 9.38 - 1.24 mg/dL 8.34   Sodium 864 - 854 mmol/L 138   Potassium 3.5 - 5.1 mmol/L 4.0   Chloride 98 - 111 mmol/L 104   CO2 22 - 32 mmol/L 23   Calcium  8.9 - 10.3 mg/dL 8.6     Imaging/Diagnostic Tests: None  Elodie Palma, MD 02/21/2024, 10:40 AM  PGY-1, [facility] FPTS Intern pager: [phone], text pages welcome Secure chat group [facility] Teaching Service

## 2024-02-22 ENCOUNTER — Ambulatory Visit

## 2024-02-22 ENCOUNTER — Telehealth (HOSPITAL_COMMUNITY): Payer: Self-pay | Admitting: Pharmacy Technician

## 2024-02-22 ENCOUNTER — Other Ambulatory Visit (HOSPITAL_COMMUNITY): Payer: Self-pay

## 2024-02-22 DIAGNOSIS — K922 Gastrointestinal hemorrhage, unspecified: Secondary | ICD-10-CM | POA: Diagnosis not present

## 2024-02-22 LAB — COMPREHENSIVE METABOLIC PANEL WITH GFR
ALT: 12 U/L (ref 0–44)
AST: 15 U/L (ref 15–41)
Albumin: 2.7 g/dL — ABNORMAL LOW (ref 3.5–5.0)
Alkaline Phosphatase: 42 U/L (ref 38–126)
Anion gap: 9 (ref 5–15)
BUN: 12 mg/dL (ref 8–23)
CO2: 23 mmol/L (ref 22–32)
Calcium: 8 mg/dL — ABNORMAL LOW (ref 8.9–10.3)
Chloride: 105 mmol/L (ref 98–111)
Creatinine, Ser: 1.73 mg/dL — ABNORMAL HIGH (ref 0.61–1.24)
GFR, Estimated: 40 mL/min — ABNORMAL LOW (ref >60)
Glucose, Bld: 131 mg/dL — ABNORMAL HIGH (ref 70–99)
Potassium: 3.7 mmol/L (ref 3.5–5.1)
Sodium: 137 mmol/L (ref 135–145)
Total Bilirubin: 0.5 mg/dL (ref 0.0–1.2)
Total Protein: 5 g/dL — ABNORMAL LOW (ref 6.5–8.1)

## 2024-02-22 LAB — CBC
HCT: 26.6 % — ABNORMAL LOW (ref 39.0–52.0)
HCT: 28.6 % — ABNORMAL LOW (ref 39.0–52.0)
Hemoglobin: 8 g/dL — ABNORMAL LOW (ref 13.0–17.0)
Hemoglobin: 8.6 g/dL — ABNORMAL LOW (ref 13.0–17.0)
MCH: 26.1 pg (ref 26.0–34.0)
MCH: 26.5 pg (ref 26.0–34.0)
MCHC: 30.1 g/dL (ref 30.0–36.0)
MCHC: 30.1 g/dL (ref 30.0–36.0)
MCV: 86.9 fL (ref 80.0–100.0)
MCV: 88 fL (ref 80.0–100.0)
Platelets: 228 K/uL (ref 150–400)
Platelets: 250 K/uL (ref 150–400)
RBC: 3.06 MIL/uL — ABNORMAL LOW (ref 4.22–5.81)
RBC: 3.25 MIL/uL — ABNORMAL LOW (ref 4.22–5.81)
RDW: 23.9 % — ABNORMAL HIGH (ref 11.5–15.5)
RDW: 23.9 % — ABNORMAL HIGH (ref 11.5–15.5)
WBC: 6.4 K/uL (ref 4.0–10.5)
WBC: 6.7 K/uL (ref 4.0–10.5)
nRBC: 0 % (ref 0.0–0.2)
nRBC: 0 % (ref 0.0–0.2)

## 2024-02-22 LAB — GLUCOSE, CAPILLARY
Glucose-Capillary: 124 mg/dL — ABNORMAL HIGH (ref 70–99)
Glucose-Capillary: 197 mg/dL — ABNORMAL HIGH (ref 70–99)
Glucose-Capillary: 238 mg/dL — ABNORMAL HIGH (ref 70–99)
Glucose-Capillary: 74 mg/dL (ref 70–99)

## 2024-02-22 MED ORDER — POTASSIUM CHLORIDE CRYS ER 20 MEQ PO TBCR
40.0000 meq | EXTENDED_RELEASE_TABLET | Freq: Once | ORAL | Status: AC
  Administered 2024-02-22: 40 meq via ORAL
  Filled 2024-02-22: qty 2

## 2024-02-22 MED ORDER — SODIUM CHLORIDE 0.9 % IV SOLN
200.0000 mg | Freq: Once | INTRAVENOUS | Status: AC
  Administered 2024-02-22: 200 mg via INTRAVENOUS
  Filled 2024-02-22: qty 10

## 2024-02-22 NOTE — Assessment & Plan Note (Signed)
 Hypothyroidism: TSH and T4 WNL BPH: Hold flomax , as patient not taking. Restart if retaining or LUTS sxs develop HLD:  Continue home rosuvastatin  40mg  daily and zetia  10mg  daily HTN: Continue home amlodipine  40 mg daily and home carvedilol  as above MSSA: ID consulted, recommended decreasing cefadroxil  from 1000mg  bid to 500mg  bid HFrEF: 12/23/2023 echo: inferior and inferoseptal akinesis with mod-sev LV dysfuntion. EF 30-35%. LV with mod decreased function and regional wall abnormalities consistent with Grade II DD. Hold home furosemide  as above in setting of AKI.

## 2024-02-22 NOTE — Telephone Encounter (Signed)
 Patient Product/process Development Scientist completed.    The patient is insured through [facility]. Patient has Medicare and is not eligible for a copay card, but may be able to apply for patient assistance or Medicare RX Payment Plan (Patient Must reach out to their plan, if eligible for payment plan), if available.    Ran test claim for octreotide 20 mg inj and Product Not on Formulary   This test claim was processed through [facility]- copay amounts may vary at other pharmacies due to [employer], or as the patient moves through the different stages of their insurance plan.     Reyes Sharps, CPHT Pharmacy Technician Patient Advocate Specialist Lead [facility] Patient Advocate Team Direct Number: [phone]  Fax: [phone]

## 2024-02-22 NOTE — Assessment & Plan Note (Addendum)
 Capsule endoscopy showing distal small bowel blood likely from telangectasias. Ok to restart aspirin  and switch Xarelto  to Eliquis per GI, we will check with vascular surgery if both necessary with his recent thrombectomy in March 2025 of his vascular stent. Monitor Hb and if it stays at 8, safe for discharge - GI signed off, close monitoring of Hb while here and then weekly post discharge. Hb this AM 8 - GI consulted, recommend to continue serial Hb and transfuse to keep Hb 7.5 - 8 - Aspirin  and Eliquis - Subcutaneous octreotide every 12 hours (short acting) to ensure he can tolerate and then if insurance would allow Depo octreotide can reduce the risk of recurrent angioectasia bleeding  - IV Protonix  40mg  BID - CBC q12 --> Waiting to see trend and see if needs transfusion - IV iron 11/6

## 2024-02-22 NOTE — Assessment & Plan Note (Signed)
 Currently in A-fib, rate controlled at 94bpm.   - Switch Xarelto  to Eliquis per GI recs given lower rates of GI bleed  - Continue home carvedilol  12.5mg  BID and monitor for hypotension - SCDs for VTE ppx

## 2024-02-22 NOTE — Assessment & Plan Note (Signed)
 Cr improving, unknown baseline with Stage 3b CKD. Suspect 2/2 pre-renal/blood loss - Receiving 500mL bolus with PRBC transfusions. Transfusion Hb threshold 7.5 - 8 via GI - Consider spot dosing 500mL boluses of fluid and monitor patient closely with long history of HFrEF with an EF of 25%  - Hold home furosemide  20mg  daily - Avoid nephrotoxic agents

## 2024-02-22 NOTE — Assessment & Plan Note (Signed)
 Hgb A1C >10, 11/2023. - Sliding scale insulin  changed from moderate to sensitive - Lantus  20u at bedtime (takes 40u at bedtime at home) - CBG before meals and at bedtime - Hold home Mounjaro , glipizide  and Jardiance 

## 2024-02-22 NOTE — Progress Notes (Signed)
 Daily Progress Note Intern Pager: [phone]  Patient name: [name] Medical record number: [id number] Date of birth: [birth year] Age: 78 y.o. Gender: male  Primary Care Provider: Fleeta Finger, Selinda, MD Consultants: GI, ID Code Status: Full code without life prolonging measures, AD on file  Pt Overview and Major Events to Date:  11/3: Admitted to FMTS, Hb 7 on admission. 2 units of PRBCs with Hb improving from 7 -- 7.2 -- 8.6 11/4: Hb continues to downtrend, CBCq8 Hb 8.6 - 8.2 - 8.0. EGD and Colonoscopy completed, video capsule study completed, awaiting results  Assessment and Plan: 78 year old male with a history of PAD/PVD prior grafting and recent angioplasty complicated by infection, chronic CHF, CKD, PAF on Xarelto , diverticulitis status post resection earlier this year with subsequent reanastomosis, MSSA infection on antibiotics from right SFA graft admitted with recurrent GI blood loss found to have a gastric and distal small bowel angiectasias.  Assessment & Plan Anemia due to blood loss GI bleed Capsule endoscopy showing distal small bowel blood likely from telangectasias. Ok to restart aspirin  and switch Xarelto  to Eliquis per GI, we will check with vascular surgery if both necessary with his recent thrombectomy in March 2025 of his vascular stent. Monitor Hb and if it stays at 8, safe for discharge - GI signed off, close monitoring of Hb while here and then weekly post discharge. Hb this AM 8 - GI consulted, recommend to continue serial Hb and transfuse to keep Hb 7.5 - 8 - Aspirin  and Eliquis - Subcutaneous octreotide every 12 hours (short acting) to ensure he can tolerate and then if insurance would allow Depo octreotide can reduce the risk of recurrent angioectasia bleeding  - IV Protonix  40mg  BID - CBC q12 --> Waiting to see trend and see if needs transfusion - IV iron 11/6  AKI (acute kidney injury) CKD stage 3b, GFR 30-44 ml/min (HCC) Cr improving, unknown baseline  with Stage 3b CKD. Suspect 2/2 pre-renal/blood loss - Receiving 500mL bolus with PRBC transfusions. Transfusion Hb threshold 7.5 - 8 via GI - Consider spot dosing 500mL boluses of fluid and monitor patient closely with long history of HFrEF with an EF of 25%  - Hold home furosemide  20mg  daily - Avoid nephrotoxic agents PAF (paroxysmal atrial fibrillation) (HCC) Currently in A-fib, rate controlled at 94bpm.   - Switch Xarelto  to Eliquis per GI recs given lower rates of GI bleed  - Continue home carvedilol  12.5mg  BID and monitor for hypotension - SCDs for VTE ppx Type 2 diabetes mellitus treated with insulin  (HCC) Hgb A1C >10, 11/2023. - Sliding scale insulin  changed from moderate to sensitive - Lantus  20u at bedtime (takes 40u at bedtime at home) - CBG before meals and at bedtime - Hold home Mounjaro , glipizide  and Jardiance   Chronic health problem Hypothyroidism: TSH and T4 WNL BPH: Hold flomax , as patient not taking. Restart if retaining or LUTS sxs develop HLD:  Continue home rosuvastatin  40mg  daily and zetia  10mg  daily HTN: Continue home amlodipine  40 mg daily and home carvedilol  as above MSSA: ID consulted, recommended decreasing cefadroxil  from 1000mg  bid to 500mg  bid HFrEF: 12/23/2023 echo: inferior and inferoseptal akinesis with mod-sev LV dysfuntion. EF 30-35%. LV with mod decreased function and regional wall abnormalities consistent with Grade II DD. Hold home furosemide  as above in setting of AKI.      FEN/GI: Carb modified diet PPx: SCDs Dispo:Home tomorrow if Hb stays stable  Subjective:  Found ambulating in room with girlfriend at side.  Continues to feel better.   Objective: Temp:  [98 F (36.7 C)-98.8 F (37.1 C)] 98.3 F (36.8 C) (11/06 0609) Pulse Rate:  [81-91] 81 (11/06 0609) Resp:  [17-18] 18 (11/06 0609) BP: (107-141)/(50-76) 107/76 (11/06 0609) SpO2:  [92 %-96 %] 95 % (11/06 0609) Physical Exam: General: Well-appearing, no acute distress Cardio:  irregularly irregular, no murmurs on exam. Pulm: Clear, no wheezing, no crackles. No increased work of breathing Abdominal: bowel sounds present, soft, non-tender, non-distended Extremities: no peripheral edema  Skin: Warm, dry   Laboratory: Most recent CBC Lab Results  Component Value Date   WBC 6.4 02/22/2024   HGB 8.0 (L) 02/22/2024   HCT 26.6 (L) 02/22/2024   MCV 86.9 02/22/2024   PLT 228 02/22/2024   Most recent BMP    Latest Ref Rng & Units 02/22/2024    4:18 AM  BMP  Glucose 70 - 99 mg/dL 868   BUN 8 - 23 mg/dL 12   Creatinine 9.38 - 1.24 mg/dL 8.26   Sodium 864 - 854 mmol/L 137   Potassium 3.5 - 5.1 mmol/L 3.7   Chloride 98 - 111 mmol/L 105   CO2 22 - 32 mmol/L 23   Calcium  8.9 - 10.3 mg/dL 8.0     Imaging/Diagnostic Tests: None  Elodie Palma, MD 02/22/2024, 9:40 AM  PGY-1, [facility] FPTS Intern pager: [phone], text pages welcome Secure chat group [facility] Teaching Service

## 2024-02-23 ENCOUNTER — Inpatient Hospital Stay (HOSPITAL_COMMUNITY)

## 2024-02-23 DIAGNOSIS — K922 Gastrointestinal hemorrhage, unspecified: Secondary | ICD-10-CM | POA: Diagnosis not present

## 2024-02-23 DIAGNOSIS — D649 Anemia, unspecified: Secondary | ICD-10-CM | POA: Diagnosis not present

## 2024-02-23 DIAGNOSIS — Z0389 Encounter for observation for other suspected diseases and conditions ruled out: Secondary | ICD-10-CM | POA: Diagnosis not present

## 2024-02-23 DIAGNOSIS — R195 Other fecal abnormalities: Secondary | ICD-10-CM

## 2024-02-23 LAB — CBC
HCT: 25.3 % — ABNORMAL LOW (ref 39.0–52.0)
HCT: 28.3 % — ABNORMAL LOW (ref 39.0–52.0)
HCT: 27 % — ABNORMAL LOW (ref 39.0–52.0)
Hemoglobin: 7.7 g/dL — ABNORMAL LOW (ref 13.0–17.0)
Hemoglobin: 8.6 g/dL — ABNORMAL LOW (ref 13.0–17.0)
Hemoglobin: 8.2 g/dL — ABNORMAL LOW (ref 13.0–17.0)
MCH: 26.6 pg (ref 26.0–34.0)
MCH: 26.5 pg (ref 26.0–34.0)
MCH: 26.6 pg (ref 26.0–34.0)
MCHC: 30.4 g/dL (ref 30.0–36.0)
MCHC: 30.4 g/dL (ref 30.0–36.0)
MCHC: 30.4 g/dL (ref 30.0–36.0)
MCV: 87.5 fL (ref 80.0–100.0)
MCV: 87.3 fL (ref 80.0–100.0)
MCV: 87.7 fL (ref 80.0–100.0)
Platelets: 228 K/uL (ref 150–400)
Platelets: 259 K/uL (ref 150–400)
Platelets: 255 K/uL (ref 150–400)
RBC: 2.89 MIL/uL — ABNORMAL LOW (ref 4.22–5.81)
RBC: 3.24 MIL/uL — ABNORMAL LOW (ref 4.22–5.81)
RBC: 3.08 MIL/uL — ABNORMAL LOW (ref 4.22–5.81)
RDW: 23.5 % — ABNORMAL HIGH (ref 11.5–15.5)
RDW: 23.1 % — ABNORMAL HIGH (ref 11.5–15.5)
RDW: 23.2 % — ABNORMAL HIGH (ref 11.5–15.5)
WBC: 6.2 K/uL (ref 4.0–10.5)
WBC: 6.3 K/uL (ref 4.0–10.5)
WBC: 6.5 K/uL (ref 4.0–10.5)
nRBC: 0 % (ref 0.0–0.2)
nRBC: 0 % (ref 0.0–0.2)
nRBC: 0 % (ref 0.0–0.2)

## 2024-02-23 LAB — BASIC METABOLIC PANEL WITH GFR
Anion gap: 8 (ref 5–15)
BUN: 15 mg/dL (ref 8–23)
CO2: 23 mmol/L (ref 22–32)
Calcium: 7.7 mg/dL — ABNORMAL LOW (ref 8.9–10.3)
Chloride: 104 mmol/L (ref 98–111)
Creatinine, Ser: 1.76 mg/dL — ABNORMAL HIGH (ref 0.61–1.24)
GFR, Estimated: 39 mL/min — ABNORMAL LOW (ref >60)
Glucose, Bld: 175 mg/dL — ABNORMAL HIGH (ref 70–99)
Potassium: 4.1 mmol/L (ref 3.5–5.1)
Sodium: 135 mmol/L (ref 135–145)

## 2024-02-23 LAB — GLUCOSE, CAPILLARY
Glucose-Capillary: 167 mg/dL — ABNORMAL HIGH (ref 70–99)
Glucose-Capillary: 168 mg/dL — ABNORMAL HIGH (ref 70–99)
Glucose-Capillary: 222 mg/dL — ABNORMAL HIGH (ref 70–99)
Glucose-Capillary: 225 mg/dL — ABNORMAL HIGH (ref 70–99)

## 2024-02-23 LAB — PREPARE RBC (CROSSMATCH)

## 2024-02-23 MED ORDER — SODIUM CHLORIDE 0.9% IV SOLUTION: Freq: Once | INTRAVENOUS | Status: DC

## 2024-02-23 MED ORDER — MELATONIN 3 MG PO TABS
3.0000 mg | ORAL_TABLET | Freq: Every day | ORAL | Status: DC
  Administered 2024-02-23 – 2024-02-24 (×2): 3 mg via ORAL
  Filled 2024-02-23 (×2): qty 1

## 2024-02-23 MED ORDER — LORATADINE 10 MG PO TABS
10.0000 mg | ORAL_TABLET | Freq: Every day | ORAL | Status: DC | PRN
Start: 2024-02-23 — End: 2024-02-25

## 2024-02-23 MED ORDER — HEPARIN SODIUM (PORCINE) 5000 UNIT/ML IJ SOLN
5000.0000 [IU] | Freq: Three times a day (TID) | INTRAMUSCULAR | Status: DC
  Administered 2024-02-23 – 2024-02-24 (×2): 5000 [IU] via SUBCUTANEOUS
  Filled 2024-02-23 (×2): qty 1

## 2024-02-23 MED ORDER — HYDROCORTISONE 1 % EX CREA
TOPICAL_CREAM | Freq: Three times a day (TID) | CUTANEOUS | Status: DC | PRN
  Filled 2024-02-23: qty 28

## 2024-02-23 MED ORDER — TECHNETIUM TC 99M-LABELED RED BLOOD CELLS IV KIT
20.0000 | PACK | Freq: Once | INTRAVENOUS | Status: AC | PRN
  Administered 2024-02-23: 20 via INTRAVENOUS

## 2024-02-23 NOTE — Assessment & Plan Note (Addendum)
 Hb down trending from 8 to 7.7. Per GI, if recurrent bleeding and anemia then he should be referred to see Dr. Rolan Clas at [facility] for consideration of double-balloon enteroscopy, perhaps retrograde enteroscopy.  - Transfuse 1 unit PRBC -f/u GI about interdepartmental transfer to [facility] due to continued bleeding and downtrending Hb - per Dr. Albertus, order tagged RBC study - Aspirin , per vascular surgery prefers ASA on due to stent - Octreotide is not covered under the patient's current insurance formulary - IV Protonix  40mg  BID - CBC q8

## 2024-02-23 NOTE — Plan of Care (Signed)

## 2024-02-23 NOTE — Plan of Care (Signed)
 FMTS Interim Progress Note  S: Met with the patient's family to provide education regarding anticoagulation management and updates on the potential transfer to [facility] for a balloon endoscopy. The patient was in IR undergoing a tagged RBC scan at the time of discussion. The partner and daughter were present and initially upset due to the delay in the patient's blood transfusion. Nursing staff later confirmed that blood was not available until recently; however, the patient's repeat hemoglobin returned at 8.6 (previously 7.7), so the transfusion will be held at this time.  Reviewed the updated CBC with the family and explained the rationale for holding transfusion given the improvement in hemoglobin. Informed them that Eliquis has been held and the patient will be transitioned to a Heparin  drip this evening. Also discussed the potential consideration of an IVC filter in the future if clinically indicated.  The family was appreciative of the updates and education provided.  O: BP 130/60 (BP Location: Right Arm)   Pulse 78   Temp 98.4 F (36.9 C) (Oral)   Resp 18   Ht 5' 7 (1.702 m)   Wt 179 lb (81.2 kg)   SpO2 99%   BMI 28.04 kg/m     A/P: - Continue plan as noted in progress note - Heparin  ordered per pharmacy consult   Elodie Palma, MD 02/23/2024, 4:55 PM PGY-1, [facility] Family Medicine Service pager [phone]

## 2024-02-23 NOTE — Assessment & Plan Note (Addendum)
 Cr was down trending, but now inc to 1.76 unknown baseline with Stage 3b CKD. Suspect 2/2 pre-renal/blood loss - bolus with PRBC transfusions - Consider spot dosing 500mL boluses of fluid and monitor patient closely with long history of HFrEF with an EF of 25%  - Hold home furosemide  20mg  daily - Avoid nephrotoxic agents -AM BMP, Mag

## 2024-02-23 NOTE — Assessment & Plan Note (Addendum)
 Currently in A-fib, rate controlled at 94bpm, educate family on Eliquis and continued bleeding and other options. Eliquis per GI recs given lower rates of GI bleed  - Hold Eliquis for now, plan to start DOAC this evening  - Continue home carvedilol  12.5mg  BID and monitor for hypotension

## 2024-02-23 NOTE — Plan of Care (Signed)
   Problem: Coping: Goal: Ability to adjust to condition or change in health will improve Outcome: Progressing   Problem: Fluid Volume: Goal: Ability to maintain a balanced intake and output will improve Outcome: Progressing

## 2024-02-23 NOTE — Assessment & Plan Note (Signed)
 Hgb A1C >10, 11/2023. - Sliding scale insulin  changed from moderate to sensitive - Lantus  20u at bedtime (takes 40u at bedtime at home) - CBG before meals and at bedtime - Hold home Mounjaro , glipizide  and Jardiance 

## 2024-02-23 NOTE — Care Management Important Message (Signed)
 Important Message  Patient Details  Name: [name] MRN: [id number] Date of Birth: 1945-06-23   Important Message Given:  Yes - Medicare IM     Jon Cruel 02/23/2024, 4:25 PM

## 2024-02-23 NOTE — Assessment & Plan Note (Signed)
 Hypothyroidism: TSH and T4 WNL BPH: Hold flomax , as patient not taking. Restart if retaining or LUTS sxs develop HLD:  Continue home rosuvastatin  40mg  daily and zetia  10mg  daily HTN: Continue home amlodipine  40 mg daily and home carvedilol  as above MSSA: ID consulted, recommended decreasing cefadroxil  from 1000mg  bid to 500mg  bid HFrEF: 12/23/2023 echo: inferior and inferoseptal akinesis with mod-sev LV dysfuntion. EF 30-35%. LV with mod decreased function and regional wall abnormalities consistent with Grade II DD. Hold home furosemide  as above in setting of AKI.

## 2024-02-23 NOTE — Progress Notes (Signed)
 Daily Progress Note Intern Pager: [phone]  Patient name: [name] Medical record number: [id number] Date of birth: 29-Mar-1946 Age: 78 y.o. Gender: male  Primary Care Provider: Fleeta Finger, Selinda, MD Consultants: GI, ID Code Status: Full code without life prolonging measures, AD on file  Pt Overview and Major Events to Date:  11/3: Admitted to FMTS, Hb 7 on admission. 2 units of PRBCs  11/4: Hb continues to downtrend, CBCq8 Hb 8.6 - 8.2 - 8.0. EGD and Colonoscopy showing gastric and distal small bowel angiectasias.  11/5:  Capsule endoscopy showing distal small bowel blood likely from telangiectasias  Assessment and Plan: [name] is a 78 year old male with CKD stage 3b, PAF on Eliquis, ischemic cardiomyopathy (EF 30-35%), and history of diverticulitis s/p takedown (06/2023), admitted for acute anemia secondary to GI bleed. EGD and colonoscopy revealed gastric and distal small bowel angiectasias; capsule endoscopy showed distal small bowel bleeding likely from telangiectasias. Hemoglobin continues to downtrend, now 7.7. Assessment & Plan Anemia due to blood loss GI bleed Hb down trending from 8 to 7.7. Per GI, if recurrent bleeding and anemia then he should be referred to see Dr. Rolan Clas at [facility] for consideration of double-balloon enteroscopy, perhaps retrograde enteroscopy.  - Transfuse 1 unit PRBC -f/u GI about interdepartmental transfer to [facility] due to continued bleeding and downtrending Hb - per Dr. Albertus, order tagged RBC study - Aspirin , per vascular surgery prefers ASA on due to stent - Octreotide is not covered under the patient's current insurance formulary - IV Protonix  40mg  BID - CBC q8 AKI (acute kidney injury) CKD stage 3b, GFR 30-44 ml/min (HCC) Cr was down trending, but now inc to 1.76 unknown baseline with Stage 3b CKD. Suspect 2/2 pre-renal/blood loss - bolus with PRBC transfusions - Consider spot dosing 500mL boluses of fluid and monitor patient  closely with long history of HFrEF with an EF of 25%  - Hold home furosemide  20mg  daily - Avoid nephrotoxic agents -AM BMP, Mag PAF (paroxysmal atrial fibrillation) (HCC) Currently in A-fib, rate controlled at 94bpm, educate family on Eliquis and continued bleeding and other options. Eliquis per GI recs given lower rates of GI bleed  - Hold Eliquis for now, plan to start DOAC this evening  - Continue home carvedilol  12.5mg  BID and monitor for hypotension Type 2 diabetes mellitus treated with insulin  (HCC) Hgb A1C >10, 11/2023. - Sliding scale insulin  changed from moderate to sensitive - Lantus  20u at bedtime (takes 40u at bedtime at home) - CBG before meals and at bedtime - Hold home Mounjaro , glipizide  and Jardiance   Chronic health problem Hypothyroidism: TSH and T4 WNL BPH: Hold flomax , as patient not taking. Restart if retaining or LUTS sxs develop HLD:  Continue home rosuvastatin  40mg  daily and zetia  10mg  daily HTN: Continue home amlodipine  40 mg daily and home carvedilol  as above MSSA: ID consulted, recommended decreasing cefadroxil  from 1000mg  bid to 500mg  bid HFrEF: 12/23/2023 echo: inferior and inferoseptal akinesis with mod-sev LV dysfuntion. EF 30-35%. LV with mod decreased function and regional wall abnormalities consistent with Grade II DD. Hold home furosemide  as above in setting of AKI.     FEN/GI: Carb modified diet PPx: SCDs Dispo: Med-Surg  Subjective:  Found sitting up in bed with girlfriend at bedside.  Patient feels more fatigued today and confirms appropriate blood in stools this morning.  Objective: Temp:  [98.2 F (36.8 C)-98.5 F (36.9 C)] 98.3 F (36.8 C) (11/07 0649) Pulse Rate:  [79-85] 84 (  11/07 0649) Resp:  [17] 17 (11/07 0649) BP: (113-132)/(58-72) 113/72 (11/07 0649) SpO2:  [94 %-98 %] 97 % (11/07 9350)  Physical Exam: General: Well-appearing, no acute distress Skin: Warm, dry, pale Cardiovascular: Irregularly irregular, no murmurs on  exam Respiratory: Clear, no wheezing, no crackles.  No increased WOB Abdomen: Soft, nontender, non distended Extremities: No peripheral edema  Laboratory: Most recent CBC Lab Results  Component Value Date   WBC 6.2 02/23/2024   HGB 7.7 (L) 02/23/2024   HCT 25.3 (L) 02/23/2024   MCV 87.5 02/23/2024   PLT 228 02/23/2024   Most recent BMP    Latest Ref Rng & Units 02/23/2024    4:35 AM  BMP  Glucose 70 - 99 mg/dL 824   BUN 8 - 23 mg/dL 15   Creatinine 9.38 - 1.24 mg/dL 8.23   Sodium 864 - 854 mmol/L 135   Potassium 3.5 - 5.1 mmol/L 4.1   Chloride 98 - 111 mmol/L 104   CO2 22 - 32 mmol/L 23   Calcium  8.9 - 10.3 mg/dL 7.7     Elodie Palma, MD 02/23/2024, 8:23 AM  PGY-1, [facility] FPTS Intern pager: [phone], text pages welcome Secure chat group [facility] Teaching Service

## 2024-02-23 NOTE — Progress Notes (Signed)
 Patient left for procedure before his hemoglobin resulted and before his 1 unit of blood was ready for transfusion. When I got the secure chat from the blood bank stating that the blood was ready for transfusion I saw that the hemoglobin had resulted in EPIC and that the value was 8.6 so I messaged the doctor- Eniola. A member of her Coletta Cassis MD responded to hold the unit.

## 2024-02-24 ENCOUNTER — Other Ambulatory Visit: Payer: Self-pay | Admitting: Cardiology

## 2024-02-24 DIAGNOSIS — K922 Gastrointestinal hemorrhage, unspecified: Secondary | ICD-10-CM | POA: Diagnosis not present

## 2024-02-24 LAB — GLUCOSE, CAPILLARY
Glucose-Capillary: 154 mg/dL — ABNORMAL HIGH (ref 70–99)
Glucose-Capillary: 159 mg/dL — ABNORMAL HIGH (ref 70–99)
Glucose-Capillary: 181 mg/dL — ABNORMAL HIGH (ref 70–99)
Glucose-Capillary: 192 mg/dL — ABNORMAL HIGH (ref 70–99)
Glucose-Capillary: 221 mg/dL — ABNORMAL HIGH (ref 70–99)

## 2024-02-24 LAB — CBC
HCT: 26.6 % — ABNORMAL LOW (ref 39.0–52.0)
HCT: 27.8 % — ABNORMAL LOW (ref 39.0–52.0)
Hemoglobin: 8.2 g/dL — ABNORMAL LOW (ref 13.0–17.0)
Hemoglobin: 8.5 g/dL — ABNORMAL LOW (ref 13.0–17.0)
MCH: 26.8 pg (ref 26.0–34.0)
MCH: 26.5 pg (ref 26.0–34.0)
MCHC: 30.8 g/dL (ref 30.0–36.0)
MCHC: 30.6 g/dL (ref 30.0–36.0)
MCV: 86.9 fL (ref 80.0–100.0)
MCV: 86.6 fL (ref 80.0–100.0)
Platelets: 247 K/uL (ref 150–400)
Platelets: 224 K/uL (ref 150–400)
RBC: 3.06 MIL/uL — ABNORMAL LOW (ref 4.22–5.81)
RBC: 3.21 MIL/uL — ABNORMAL LOW (ref 4.22–5.81)
RDW: 23.1 % — ABNORMAL HIGH (ref 11.5–15.5)
RDW: 22.8 % — ABNORMAL HIGH (ref 11.5–15.5)
WBC: 6.8 K/uL (ref 4.0–10.5)
WBC: 6 K/uL (ref 4.0–10.5)
nRBC: 0 % (ref 0.0–0.2)
nRBC: 0.3 % — ABNORMAL HIGH (ref 0.0–0.2)

## 2024-02-24 LAB — HEPARIN LEVEL (UNFRACTIONATED): Heparin Unfractionated: >1.1 [IU]/mL — ABNORMAL HIGH (ref 0.30–0.70)

## 2024-02-24 LAB — APTT: aPTT: 34 s (ref 24–36)

## 2024-02-24 MED ORDER — APIXABAN 2.5 MG PO TABS
2.5000 mg | ORAL_TABLET | Freq: Two times a day (BID) | ORAL | Status: DC
  Administered 2024-02-24 – 2024-02-25 (×3): 2.5 mg via ORAL
  Filled 2024-02-24 (×3): qty 1

## 2024-02-24 MED ORDER — OCTREOTIDE ACETATE 50 MCG/ML IJ SOLN
50.0000 ug | Freq: Two times a day (BID) | INTRAMUSCULAR | Status: AC
  Administered 2024-02-24: 50 ug via SUBCUTANEOUS
  Filled 2024-02-24: qty 1

## 2024-02-24 MED ORDER — HEPARIN (PORCINE) 25000 UT/250ML-% IV SOLN
900.0000 [IU]/h | INTRAVENOUS | Status: DC
  Administered 2024-02-24: 900 [IU]/h via INTRAVENOUS
  Filled 2024-02-24: qty 250

## 2024-02-24 NOTE — Assessment & Plan Note (Signed)
 T2DM: On sSSI, Lantus  20u at bedtime (home regimen of 40u); holding home Mounjaro , glipizide  and Jardiance   MSSA: Cefadroxil  500 mg BID per ID. Hypothyroidism: TSH and T4 WNL. BPH: Hold Flomax  (patient not taking outpatient); restart if retaining or LUTS sxs develop. HLD: Continue home rosuvastatin  40mg  daily, zetia  10mg  daily. HTN: Continue home amlodipine  40 mg daily, carvedilol  12.5 mg BID. HFrEF: Echo 9/6 with EF 30-35%, inferior and inferoseptal akinesis, Grade II DD. Holding furosemide  as above.

## 2024-02-24 NOTE — Progress Notes (Signed)
 PHARMACY - ANTICOAGULATION CONSULT NOTE  Pharmacy Consult for heparin  Indication: atrial fibrillation  Allergies  Allergen Reactions   Zestril  [Lisinopril ] Swelling and Rash    Facial and tongue swelling   Levaquin [Levofloxacin] Other (See Comments)    Unknown reaction    Patient Measurements: Height: 5' 7 (170.2 cm) Weight: 81.2 kg (179 lb) IBW/kg (Calculated) : 66.1 HEPARIN  DW (KG): 81.2  Vital Signs: Temp: 97.9 F (36.6 C) (11/08 0558) Temp Source: Oral (11/08 0558) BP: 120/56 (11/08 0558) Pulse Rate: 83 (11/08 0558)  Labs: Recent Labs    02/21/24 0757 02/21/24 1804 02/22/24 0418 02/22/24 1832 02/23/24 0435 02/23/24 1448 02/23/24 2212  HGB 8.8*   < > 8.0*   < > 7.7* 8.6* 8.2*  HCT 28.5*   < > 26.6*   < > 25.3* 28.3* 27.0*  PLT 258   < > 228   < > 228 259 255  CREATININE 1.65*  --  1.73*  --  1.76*  --   --    < > = values in this interval not displayed.    Estimated Creatinine Clearance: 34.7 mL/min (A) (by C-G formula based on SCr of 1.76 mg/dL (H)).   Medical History: Past Medical History:  Diagnosis Date   Acute low back pain 01/12/2015   Acute lower limb ischemia 07/11/2023   Acute pain of both shoulders 07/22/2022   AKI (acute kidney injury) 05/24/2022   Arthritis    Atherosclerosis of native arteries of the extremities with intermittent claudication 08/13/2013   Atherosclerosis of native artery of extremity with intermittent claudication 08/13/2013   IMO SNOMED Dx Update Oct 2024     Backache 02/08/2013   Benign prostatic hyperplasia with urinary frequency 02/23/2023   Bladder cancer (HCC)    resection x3   BMI 27.0-27.9,adult 02/23/2023   Cellulitis 07/17/2023   Chronic combined systolic (congestive) and diastolic (congestive) heart failure (HCC) 07/22/2022   Chronic left shoulder pain 05/24/2023   Chronic right shoulder pain 09/02/2022   Chronic systolic CHF (congestive heart failure) (HCC)    CKD stage 3b, GFR 30-44 ml/min (HCC)  07/03/2023   Coronary artery disease    status post DMI RX Taxus stent RCA 2006 with susequent Stent LAD and subsequent  stent thrombosis RCA unable to be opened 2006 -neg mv 10/2008, 10/16/17 ISR to pLAD with PTCA/DES, CTO of RCA with collaterals, EF 25%   Coronary artery disease involving native coronary artery of native heart with unstable angina pectoris (HCC)    Current moderate episode of major depressive disorder without prior episode (HCC) 03/28/2022   Diverticulitis 06/10/2022   Diverticulitis large intestine 05/11/2021   Diverticulitis of large intestine with abscess 05/24/2022   Diverticulitis of sigmoid colon 05/11/2021   DM2 (diabetes mellitus, type 2) (HCC) 02/09/2022   status post bilateral aortobifemoral bypass [id number] with recent fem to fembypass April  2011 per DR. Early     Dyssynergic defecation 03/21/2023   Essential hypertension 07/22/2022   Fatigue 03/13/2023   History of bladder cancer 02/23/2023   History of colonic diverticulitis 03/21/2023   Hypercholesterolemia 10/28/2022   HYPERLIPIDEMIA-MIXED 09/22/2008   Qualifier: Diagnosis of  By: Jaramillo, Luz     Hypothyroidism 05/24/2022   Ischemic cardiomyopathy    ejection fraction of 40-45%   Lethargy 03/28/2022   Lumbar spinal stenosis    Myocardial infarction (HCC)    I've had 4 (10/16/2017)   Need for prophylactic vaccination and inoculation against influenza 03/28/2022   NSVT (nonsustained ventricular tachycardia) (HCC)  PAD (peripheral artery disease) 07/17/2023   PAF (paroxysmal atrial fibrillation) (HCC) 02/09/2022   Paroxysmal atrial fibrillation (HCC)    Peripheral arterial disease    Peripheral artery disease 07/09/2023   Poorly controlled T2 diabetes mellitus (HCC) 09/27/2022   Protein-calorie malnutrition, severe 05/29/2022   PVC's (premature ventricular contractions)    PVD 09/22/2008   Qualifier: Diagnosis of  By: Justina Kos     PVD (peripheral vascular disease) 02/23/2023   Rotator cuff  arthropathy of right shoulder 09/27/2022   Sigmoid diverticulitis 06/30/2023   Status post coronary artery stent placement    Status post Hartmann's procedure (HCC) 03/21/2023   TOBACCO ABUSE 05/29/2009   Qualifier: Diagnosis of  By: Velinda, RN, BSN, Avelina CROME    Trigger ring finger of right hand 03/29/2023   Type 2 diabetes mellitus with other circulatory complications (HCC) 07/22/2022   Type II diabetes mellitus (HCC)    Unstable angina (HCC) 10/16/2017    Medications:  Medications Prior to Admission  Medication Sig Dispense Refill Last Dose/Taking   acetaminophen  (TYLENOL ) 500 MG tablet Take 1,000 mg by mouth every 6 (six) hours as needed for headache or fever (pain).   Past Month   amLODipine  (NORVASC ) 5 MG tablet TAKE 1 TABLET(5 MG) BY MOUTH DAILY 90 tablet 2 02/17/2024   aspirin  EC 81 MG tablet Take 1 tablet (81 mg total) by mouth daily. Swallow whole. 90 tablet 3 02/17/2024   carvedilol  (COREG ) 12.5 MG tablet Take 1 tablet (12.5 mg total) by mouth 2 (two) times daily with a meal. 180 tablet 3 02/17/2024   cefadroxil  (DURICEF) 500 MG capsule Take 2 capsules (1,000 mg total) by mouth 2 (two) times daily. 120 capsule 5 02/17/2024   empagliflozin  (JARDIANCE ) 10 MG TABS tablet Take 1 tablet (10 mg total) by mouth daily. 90 tablet 3 02/17/2024   ezetimibe  (ZETIA ) 10 MG tablet Take 1 tablet (10 mg total) by mouth daily. 90 tablet 3 02/17/2024   furosemide  (LASIX ) 20 MG tablet Take 1 tablet (20 mg total) by mouth every morning. (Patient taking differently: Take 20 mg by mouth every Monday, Wednesday, and Friday.) 90 tablet 1 02/16/2024   insulin  glargine (LANTUS ) 100 unit/mL SOPN Inject 40 Units into the skin daily. 46 units subcut daily at nighttime (Patient taking differently: Inject 40 Units into the skin at bedtime.) 30 mL 4 02/16/2024   Insulin  Pen Needle 32G X 4 MM MISC 1 Device by Does not apply route daily in the afternoon. 100 each 3 Taking   nitroGLYCERIN  (NITROSTAT ) 0.4 MG SL tablet  Place 0.4 mg under the tongue every 5 (five) minutes x 3 doses as needed for chest pain.   Taking As Needed   pantoprazole  (PROTONIX ) 40 MG tablet Take 40 mg by mouth 2 (two) times daily.   02/17/2024   rivaroxaban  (XARELTO ) 20 MG TABS tablet Take 1 tablet (20 mg total) by mouth daily with supper. (Patient taking differently: Take 20 mg by mouth at bedtime.) 30 tablet 3 02/16/2024   rosuvastatin  (CRESTOR ) 40 MG tablet TAKE 1 TABLET(40 MG) BY MOUTH DAILY 90 tablet 1 02/17/2024   tirzepatide  (MOUNJARO ) 5 MG/0.5ML Pen Inject 5 mg into the skin once a week. (Patient taking differently: Inject 5 mg into the skin every Sunday.) 6 mL 2 02/11/2024   glipiZIDE  (GLUCOTROL ) 5 MG tablet Take 1 tablet (5 mg total) by mouth 2 (two) times daily before a meal. (Patient not taking: Reported on 02/19/2024) 180 tablet 3 Not Taking   isosorbide  dinitrate (ISORDIL )  10 MG tablet Take 1 tablet (10 mg total) by mouth 2 (two) times daily. (Patient not taking: Reported on 02/19/2024) 180 tablet 3 Not Taking   Scheduled:   sodium chloride    Intravenous Once   amLODipine   5 mg Oral Daily   aspirin  EC  81 mg Oral Daily   carvedilol   12.5 mg Oral BID WC   cefadroxil   500 mg Oral BID   ezetimibe   10 mg Oral Daily   insulin  aspart  0-9 Units Subcutaneous Q6H   insulin  glargine-yfgn  20 Units Subcutaneous QHS   melatonin  3 mg Oral QHS   octreotide  50 mcg Subcutaneous Q12H   pantoprazole   40 mg Oral QAC breakfast   rosuvastatin   40 mg Oral Daily    Assessment: 78yo male admitted for ABLA/GIB with apixaban for PAF held, now cleared to start heparin  while awaiting plan to possibly resume DOAC; last dose of apixaban was 11/7 10a.  Goal of Therapy:  Heparin  level 0.3-0.5 units/ml aPTT 66-85 seconds Monitor platelets by anticoagulation protocol: Yes   Plan:  Start heparin  infusion cautiously at 900 units/hr. Monitor heparin  levels, aPTTs (while DOAC affects anti-Xa assay), and CBC.  Marvetta Dauphin, PharmD, BCPS   02/24/2024,6:58 AM

## 2024-02-24 NOTE — Assessment & Plan Note (Addendum)
 In regular rhythm today. Previously on Eliquis, held during admission in setting of GI bleed.  - DISCONTINUE heparin  and RESTART Eliquis now that patient stable from GI bleed perspective - Continuing home carvedilol  12.5 mg BID for rate control

## 2024-02-24 NOTE — Progress Notes (Signed)
 Daily Progress Note Intern Pager: [phone]  Patient name: [name] Medical record number: [id number] Date of birth: 01/02/1946 Age: 78 y.o. Gender: male  Primary Care Provider: Fleeta Finger, Selinda, MD Consultants: GI, ID (queried, not formally consulted) Code Status: Full code without life prolonging measures  Pt Overview and Major Events to Date:  11/3: Admitted to FMTS, given 2 units of PRBCs  11/4: EGD and Colonoscopy 11/5: Capsule endoscopy 11/7: Tagged RBC study  Medical Decision Making:  [name] is a 78 y.o. male with PMH of CKD stage 3b, PAF on Eliquis, ischemic cardiomyopathy (EF 30-35%), and history of diverticulitis s/p takedown (06/2023). Admitted for anemia 2/2 GI bleed as confirmed by EGD/colonoscopy and capsule endoscopy with gastric and distal small bowel angiectasias and distal small bowel bleed. Tagged RBC study without bleeding. Initial drop in Hgb on 11/7, 1 unit pRBCs ordered, however Hgb improved on recheck and so unit not given.  Plan per GI that, if patient has recurrent bleeding and anemia, consider transfer to Dr. Rolan Clas at [facility] for consideration of double-balloon enteroscopy, possible retrograde enteroscopy.   Spoke with patient, patient's girlfriend (at bedside), and patient's daughter (via phone) about plan for today and my understanding of GIs plan re: Following up at [facility].  Relayed plan to start patient on Eliquis today and monitor for signs or symptoms of bleed overnight.  Relayed that we will not be transferring patient to [facility] and less he starts experiencing active bleed again.  All questions answered and patient, daughter, and patient's girlfriend all verbalized understanding. Assessment & Plan Anemia due to blood loss GI bleed Hgb this morning of 8.2, stable from 8.2 yesterday. - GI following, appreciate recommendations - IV Protonix  40 mg BID - Octreotide 50 mcg BID - Messaged Dr. Albertus with GI about plan for this medication given  Octreotide depot not covered by patient's insurance - On ASA 81 mg daily per vascular surgery due to stent - Labs: CBC q12h, BMP, Mag - Transfusion threshold >8 PAF (paroxysmal atrial fibrillation) (HCC) In regular rhythm today. Previously on Eliquis, held during admission in setting of GI bleed.  - DISCONTINUE heparin  and RESTART Eliquis now that patient stable from GI bleed perspective - Continuing home carvedilol  12.5 mg BID for rate control AKI (acute kidney injury) (Resolved: 02/24/2024) CKD stage 3b, GFR 30-44 ml/min (HCC) Unclear baseline Cr; Cr has improved from admission during hospitalization. Suspect AKI 2/2 prerenal/blood loss. - Holding home furosemide  20 mg daily - Avoid nephrotoxic agents Chronic health problem T2DM: On sSSI, Lantus  20u at bedtime (home regimen of 40u); holding home Mounjaro , glipizide  and Jardiance   MSSA: Cefadroxil  500 mg BID per ID. Hypothyroidism: TSH and T4 WNL. BPH: Hold Flomax  (patient not taking outpatient); restart if retaining or LUTS sxs develop. HLD:  Continue home rosuvastatin  40mg  daily, zetia  10mg  daily. HTN: Continue home amlodipine  40 mg daily, carvedilol  12.5 mg BID. HFrEF: Echo 9/6 with EF 30-35%, inferior and inferoseptal akinesis, Grade II DD. Holding furosemide  as above.  FEN/GI: Carb-modified diet PPx: Eliquis 2.5 mg BID Dispo: Home tomorrow pending tolerating Eliquis without repeat bleed.  Subjective:  Patient reports he is feeling okay today.  Denies any dizziness, lightheadedness.  Denies any blood in stools.  Objective: Temp:  [97.8 F (36.6 C)-98 F (36.7 C)] 97.9 F (36.6 C) (11/08 1000) Pulse Rate:  [75-88] 80 (11/08 1058) Resp:  [16-18] 16 (11/08 1058) BP: (98-130)/(47-70) 121/61 (11/08 1058) SpO2:  [94 %-97 %] 97 % (11/08 1000)  Physical Exam: General: Patient lying down in bed, no acute distress. Cardiovascular: Regular rate and rhythm, no murmurs/rubs/gallops. Respiratory: Normal work of breathing on room air.  Clear to auscultation bilaterally; no wheezes, crackles. Abdomen: Bowel sounds present and normoactive bilaterally. Soft, nondistended, nontender. Extremities: Skin warm, dry. No bilateral lower extremity edema. Neuro: Awake and appropriate responding to questions.  Laboratory: Most recent CBC Lab Results  Component Value Date   WBC 6.8 02/24/2024   HGB 8.2 (L) 02/24/2024   HCT 26.6 (L) 02/24/2024   MCV 86.9 02/24/2024   PLT 247 02/24/2024   Most recent BMP    Latest Ref Rng & Units 02/23/2024    4:35 AM  BMP  Glucose 70 - 99 mg/dL 824   BUN 8 - 23 mg/dL 15   Creatinine 9.38 - 1.24 mg/dL 8.23   Sodium 864 - 854 mmol/L 135   Potassium 3.5 - 5.1 mmol/L 4.1   Chloride 98 - 111 mmol/L 104   CO2 22 - 32 mmol/L 23   Calcium  8.9 - 10.3 mg/dL 7.7     Larraine Palma, MD 02/24/2024, 12:26 PM  PGY-1, [facility] FPTS Intern pager: [phone], text pages welcome Secure chat group [facility] Teaching Service

## 2024-02-24 NOTE — Assessment & Plan Note (Signed)
 Hgb this morning of 8.1, stable from 8.5 yesterday. - GI following, appreciate recommendations - IV Protonix  40 mg BID - Octreotide 50 mcg BID - Octreotide not covered by insurance, GI determine plan of action and discussed referral to Dr. Charlean at [facility] s/p discharge for consideration of deep or retrograde deep enteroscopy - On ASA 81 mg daily per vascular surgery due to stent - Labs: CBC, BMP, Mag - Transfusion threshold >8

## 2024-02-24 NOTE — Plan of Care (Signed)
   Problem: Education: Goal: Ability to describe self-care measures that may prevent or decrease complications (Diabetes Survival Skills Education) will improve Outcome: Progressing   Problem: Coping: Goal: Ability to adjust to condition or change in health will improve Outcome: Progressing   Problem: Fluid Volume: Goal: Ability to maintain a balanced intake and output will improve Outcome: Progressing

## 2024-02-24 NOTE — Assessment & Plan Note (Signed)
 Unclear baseline Cr; Cr has improved from admission during hospitalization. Suspect AKI 2/2 prerenal/blood loss. - Holding home furosemide  20 mg daily - Avoid nephrotoxic agents

## 2024-02-24 NOTE — Assessment & Plan Note (Signed)
 In regular rhythm today. Restarted Eliquis 11/7, hemoglobin 8.1 this AM.  - Continuing home carvedilol  12.5 mg BID for rate control - Eliquis 2.5 mg BID

## 2024-02-24 NOTE — Assessment & Plan Note (Addendum)
 Hgb this morning of 8.2, stable from 8.2 yesterday. - GI following, appreciate recommendations - IV Protonix  40 mg BID - Octreotide 50 mcg BID - Messaged Dr. Albertus with GI about plan for this medication given Octreotide depot not covered by patient's insurance - On ASA 81 mg daily per vascular surgery due to stent - Labs: CBC q12h, BMP, Mag - Transfusion threshold >8

## 2024-02-24 NOTE — Progress Notes (Signed)
 Daily Progress Note Intern Pager: [phone]  Patient name: [name] Medical record number: [id number] Date of birth: 08/23/1945 Age: 78 y.o. Gender: male  Primary Care Provider: Fleeta Finger, Selinda, MD Consultants: GI Code Status: Full code without life-prolonging measures  Pt Overview and Major Events to Date:  11/3: Admitted; 2u PRBCs 11/4: EGD and colonoscopy 11/5: Capsule endoscopy 11/7: Tagged RBC study  Medical Decision Making: [name] is a 78 y.o. male with PMH of CKD stage IIIb, PAF on Eliquis, ischemic cardiomyopathy (EF 30 to 35%), and history of diverticulitis admitted for anemia 2/2 GIB confirmed by EGD/colonoscopy and capsule endoscopy with gastric and distal small bowel angiectasias and distal small bowel bleed.  Tagged RBC study without bleeding. Assessment & Plan Anemia due to blood loss GI bleed Hgb this morning of 8.1, stable from 8.5 yesterday. - GI following, appreciate recommendations - IV Protonix  40 mg BID - Octreotide 50 mcg BID - Octreotide not covered by insurance, GI determine plan of action and discussed referral to Dr. Charlean at [facility] s/p discharge for consideration of deep or retrograde deep enteroscopy - On ASA 81 mg daily per vascular surgery due to stent - Labs: CBC, BMP, Mag - Transfusion threshold >8 PAF (paroxysmal atrial fibrillation) (HCC) In regular rhythm today. Restarted Eliquis 11/7, hemoglobin 8.1 this AM.  - Continuing home carvedilol  12.5 mg BID for rate control - Eliquis 2.5 mg BID CKD stage 3b, GFR 30-44 ml/min (HCC) Unclear baseline Cr; Cr has improved from admission during hospitalization. Suspect AKI 2/2 prerenal/blood loss. - Holding home furosemide  20 mg daily - Avoid nephrotoxic agents Chronic health problem T2DM: On sSSI, Lantus  20u at bedtime (home regimen of 40u); holding home Mounjaro , glipizide  and Jardiance   MSSA: Cefadroxil  500 mg BID per ID with follow up outpatient Hypothyroidism: TSH and T4 WNL. BPH: Hold  Flomax  (patient not taking outpatient); restart if retaining or LUTS sxs develop. HLD: Continue home rosuvastatin  40mg  daily, zetia  10mg  daily. HTN: Continue home amlodipine  40 mg daily, carvedilol  12.5 mg BID. HFrEF: Echo 9/6 with EF 30-35%, inferior and inferoseptal akinesis, Grade II DD. Holding furosemide  as above.  FEN/GI: Carb modified diet PPx: Eliquis Dispo:Home today pending CBC stability on Eliquis.   Subjective:  Gene is doing well this morning and has no concerns.  Objective: Temp:  [97.9 F (36.6 C)-98.6 F (37 C)] 98.1 F (36.7 C) (11/09 0526) Pulse Rate:  [72-81] 81 (11/09 0526) Resp:  [16-18] 18 (11/09 0526) BP: (98-138)/(47-62) 119/57 (11/09 0526) SpO2:  [94 %-97 %] 96 % (11/09 0526) Physical Exam: General: Awake and Alert in NAD HEENT: NCAT. Sclera anicteric. No rhinorrhea. Cardiovascular: RRR. No M/R/G Respiratory: CTAB, normal WOB on RA. No wheezing, crackles, rhonchi, or diminished breath sounds. Abdomen: Soft, non-tender, non-distended. Bowel sounds normoactive Extremities: Able to move all extremities. No BLE edema, no deformities or significant joint findings. Skin: Warm and dry. No abrasions or rashes noted. Neuro: A&Ox3. No focal neurological deficits.  Laboratory: Most recent CBC Lab Results  Component Value Date   WBC 5.3 02/25/2024   HGB 8.1 (L) 02/25/2024   HCT 27.0 (L) 02/25/2024   MCV 87.9 02/25/2024   PLT 268 02/25/2024   Most recent BMP    Latest Ref Rng & Units 02/23/2024    4:35 AM  BMP  Glucose 70 - 99 mg/dL 824   BUN 8 - 23 mg/dL 15   Creatinine 9.38 - 1.24 mg/dL 8.23   Sodium 864 - 854 mmol/L 135  Potassium 3.5 - 5.1 mmol/L 4.1   Chloride 98 - 111 mmol/L 104   CO2 22 - 32 mmol/L 23   Calcium  8.9 - 10.3 mg/dL 7.7    Imaging/Diagnostic Tests: No new imaging.  Janna Ferrier, DO 02/25/2024, 7:19 AM  PGY-2, [facility] FPTS Intern pager: [phone], text pages welcome Secure chat group [facility] Teaching Service

## 2024-02-24 NOTE — Plan of Care (Signed)
  Problem: Coping: Goal: Ability to adjust to condition or change in health will improve Outcome: Progressing   Problem: Nutritional: Goal: Maintenance of adequate nutrition will improve Outcome: Progressing   Problem: Skin Integrity: Goal: Risk for impaired skin integrity will decrease Outcome: Progressing   Problem: Clinical Measurements: Goal: Will remain free from infection Outcome: Progressing   Problem: Activity: Goal: Risk for activity intolerance will decrease Outcome: Progressing

## 2024-02-24 NOTE — Progress Notes (Signed)
 Progress Note   Assessment    78 year old male with a history of PAD/PVD prior grafting and recent angioplasty complicated by infection, chronic CHF, CKD, PAF on Xarelto  on admit, diverticulitis status post resection earlier this year with subsequent reanastomosis, MSSA infection on antibiotics from right SFA graft admitted with recurrent GI blood loss found to have a gastric and distal small bowel angioectasias   Principal Problem:   Acute GI bleeding Active Problems:   Hyperlipidemia   PAF (paroxysmal atrial fibrillation) (HCC)   Hypothyroidism   Type 2 diabetes mellitus treated with insulin  (HCC)   HTN (hypertension)   Chronic HFrEF (heart failure with reduced ejection fraction) (HCC)   CKD stage 3b, GFR 30-44 ml/min (HCC)   BPH (benign prostatic hyperplasia)   MSSA (methicillin susceptible Staphylococcus aureus) infection   Acute anemia   Anemia due to blood loss   Pressure injury of skin   GI bleed   Rectal bleeding   Benign neoplasm of ascending colon   Benign neoplasm of transverse colon   Benign neoplasm of sigmoid colon   Gastric and duodenal angiodysplasia   Chronic health problem   Angiodysplasia of small intestine, except duodenum with bleeding   Occult GI bleeding   Recommendations   1.  Recurrent GI bleed/gastric and small bowel angioectasias --he did have repeat bleeding yesterday but responded to 1 more unit of packed cells.  That is 3 total since admission.  Bowel movements today without blood.  He has been started on Eliquis -- Continue Eliquis -- Aspirin  if absolutely necessary -- Depo octreotide not covered by his insurance -- I will refer him to see Dr. Charlean at [facility] after discharge for consideration of deep or retrograde deep enteroscopy. -- Will plan CBC on Monday or Tuesday at [facility] in [place] near his home.  These results can be faxed back to me -- I did advise he and his girlfriend that should he have recurrent bleeding after discharge, and  assuming it is safe to do so, I would recommend that he return to the ER but I do where they have the enteroscopy equipment necessary for deep enteroscopy  35 minutes total spent today including patient facing time, coordination of care, reviewing medical history/procedures/pertinent radiology studies, and documentation of the encounter.    Chief Complaint   Bowel movement today without blood 1 unit of packed cells yesterday for monitor blood in bowel movement Less appetite today but no nausea or vomiting Girlfriend at bedside who reminds me that he lives alone but she does see him on weekends.  His daughter is able to check in on him occasionally.  Vital signs in last 24 hours: Temp:  [97.8 F (36.6 C)-98 F (36.7 C)] 97.9 F (36.6 C) (11/08 1000) Pulse Rate:  [75-88] 80 (11/08 1058) Resp:  [16-18] 16 (11/08 1058) BP: (98-130)/(47-70) 121/61 (11/08 1058) SpO2:  [94 %-97 %] 97 % (11/08 1000) Last BM Date : 02/23/24 Gen: awake, alert, NAD HEENT: anicteric  Abd: soft, NT/ND, +BS throughout Ext: no c/c/e Neuro: nonfocal   Intake/Output from previous day: 11/07 0701 - 11/08 0700 In: 720 [P.O.:720] Out: -  Intake/Output this shift: No intake/output data recorded.  Lab Results: Recent Labs    02/23/24 1448 02/23/24 2212 02/24/24 0623  WBC 6.3 6.5 6.8  HGB 8.6* 8.2* 8.2*  HCT 28.3* 27.0* 26.6*  PLT 259 255 247   BMET Recent Labs    02/22/24 0418 02/23/24 0435  NA 137 135  K 3.7 4.1  CL 105 104  CO2 23 23  GLUCOSE 131* 175*  BUN 12 15  CREATININE 1.73* 1.76*  CALCIUM  8.0* 7.7*   LFT Recent Labs    02/22/24 0418  PROT 5.0*  ALBUMIN  2.7*  AST 15  ALT 12  ALKPHOS 42  BILITOT 0.5     Studies/Results: NM GI Blood Loss Result Date: 02/23/2024 EXAM: NM GI Bleeding Scan. CLINICAL HISTORY: Acute GI bleeding. TECHNIQUE: Dynamic anterior images of the abdomen and pelvis were obtained during the time of radio-labeled autologous red blood cells. Images were  acquired for 60 minutes. RADIOPHARMACEUTICAL: 20 millicurie technetium Tc 99m -labeled red blood cells (ULTRATAG ) injection kit. COMPARISON: None provided. FINDINGS: STOMACH AND BOWEL: There is no accumulation of tagged red blood cells within the abdomen or pelvis to suggest active bleeding into the GI tract. OTHER VISCERA: Physiologic activity in the liver, vasculature, and bladder noted. IMPRESSION: 1. No active gastrointestinal bleeding localized. Electronically signed by: Norleen Boxer MD 02/23/2024 06:15 PM EST RP Workstation: [id number]      LOS: 5 days   [name] CHRISTELLA Starch, MD 02/24/2024, 2:34 PM See [name],  GI, to contact our on call provider

## 2024-02-25 ENCOUNTER — Other Ambulatory Visit (HOSPITAL_COMMUNITY): Payer: Self-pay

## 2024-02-25 LAB — TYPE AND SCREEN
ABO/RH(D): O NEG
Antibody Screen: NEGATIVE
Unit division: 0

## 2024-02-25 LAB — BASIC METABOLIC PANEL WITH GFR
Anion gap: 12 (ref 5–15)
BUN: 16 mg/dL (ref 8–23)
CO2: 23 mmol/L (ref 22–32)
Calcium: 8.5 mg/dL — ABNORMAL LOW (ref 8.9–10.3)
Chloride: 104 mmol/L (ref 98–111)
Creatinine, Ser: 1.9 mg/dL — ABNORMAL HIGH (ref 0.61–1.24)
GFR, Estimated: 35 mL/min — ABNORMAL LOW (ref >60)
Glucose, Bld: 137 mg/dL — ABNORMAL HIGH (ref 70–99)
Potassium: 3.9 mmol/L (ref 3.5–5.1)
Sodium: 139 mmol/L (ref 135–145)

## 2024-02-25 LAB — GLUCOSE, CAPILLARY: Glucose-Capillary: 136 mg/dL — ABNORMAL HIGH (ref 70–99)

## 2024-02-25 LAB — CBC
HCT: 27 % — ABNORMAL LOW (ref 39.0–52.0)
Hemoglobin: 8.1 g/dL — ABNORMAL LOW (ref 13.0–17.0)
MCH: 26.4 pg (ref 26.0–34.0)
MCHC: 30 g/dL (ref 30.0–36.0)
MCV: 87.9 fL (ref 80.0–100.0)
Platelets: 268 K/uL (ref 150–400)
RBC: 3.07 MIL/uL — ABNORMAL LOW (ref 4.22–5.81)
RDW: 23 % — ABNORMAL HIGH (ref 11.5–15.5)
WBC: 5.3 K/uL (ref 4.0–10.5)
nRBC: 0 % (ref 0.0–0.2)

## 2024-02-25 LAB — BPAM RBC
Blood Product Expiration Date: 202511122359
Unit Type and Rh: 9500

## 2024-02-25 LAB — APTT: aPTT: 34 s (ref 24–36)

## 2024-02-25 LAB — HEPARIN LEVEL (UNFRACTIONATED): Heparin Unfractionated: >1.1 [IU]/mL — ABNORMAL HIGH (ref 0.30–0.70)

## 2024-02-25 LAB — MAGNESIUM: Magnesium: 1.9 mg/dL (ref 1.7–2.4)

## 2024-02-25 MED ORDER — CEFADROXIL 500 MG PO CAPS
500.0000 mg | ORAL_CAPSULE | Freq: Two times a day (BID) | ORAL | 5 refills | Status: DC
  Filled 2024-02-25: qty 60, 30d supply, fill #0

## 2024-02-25 MED ORDER — APIXABAN 2.5 MG PO TABS
2.5000 mg | ORAL_TABLET | Freq: Two times a day (BID) | ORAL | 0 refills | Status: DC
  Filled 2024-02-25: qty 60, 30d supply, fill #0

## 2024-02-25 MED ORDER — INSULIN GLARGINE 100 UNITS/ML SOLOSTAR PEN
20.0000 [IU] | PEN_INJECTOR | Freq: Every day | SUBCUTANEOUS | 4 refills | Status: DC
  Filled 2024-02-25: qty 15, 75d supply, fill #0

## 2024-02-25 NOTE — Plan of Care (Signed)
  Problem: Health Behavior/Discharge Planning: Goal: Ability to identify and utilize available resources and services will improve Outcome: Progressing   Problem: Health Behavior/Discharge Planning: Goal: Ability to manage health-related needs will improve Outcome: Progressing   Problem: Activity: Goal: Risk for activity intolerance will decrease Outcome: Progressing

## 2024-02-25 NOTE — Discharge Summary (Addendum)
 Family Medicine Teaching [facility] Discharge Summary  Patient name: [name] Medical record number: [id number] Date of birth: November 05, 1945 Age: 78 y.o. Gender: male Date of Admission: 02/18/2024  Date of Discharge: 02/25/24  Admitting Physician: Camie Dixons, DO  Primary Care Provider: Fleeta Finger, Selinda, MD Consultants: GI, ID  Indication for Hospitalization: Bloody stool  Discharge Diagnoses/Problem List:  Principal Problem for Admission: GI Bleed Other Problems addressed during stay:  Principal Problem:   Acute GI bleeding Active Problems:   Hypothyroidism   Chronic HFrEF (heart failure with reduced ejection fraction) (HCC)   HTN (hypertension)   CKD stage 3b, GFR 30-44 ml/min (HCC)   PAF (paroxysmal atrial fibrillation) (HCC)   Hyperlipidemia   Type 2 diabetes mellitus treated with insulin  (HCC)   BPH (benign prostatic hyperplasia)   MSSA (methicillin susceptible Staphylococcus aureus) infection   Acute anemia   Anemia due to blood loss   Pressure injury of skin   GI bleed   Rectal bleeding   Benign neoplasm of ascending colon   Benign neoplasm of transverse colon   Benign neoplasm of sigmoid colon   Gastric and duodenal angiodysplasia   Angiodysplasia of small intestine, except duodenum with bleeding   Occult GI bleeding  Brief Hospital Course:  [name] is a 78 y.o. male with history of Type 2 diabetes, PAD, atrial fibrillation on AC, s/p ostomy reversal (prior Hartman's), history of MI, hypothyroidism, HLD, history of bladder cancer, coronary artery disease, CHF (LVEF 30-35%)  who was admitted for symptomatic anemia 2/2 suspected GI bleed.  GI bleed Previous admission in September for symptomatic anemia suspected to be GI bleed.  Had planned for outpatient colonoscopy/EGD 11/04. but returned with symptomatic anemia. Hemoglobin 7.2 with positive fecal occult on admission.  Started on IV Protonix , held Xarelto  but continued aspirin  given his significant  PAD/vascular history.  GI consulted and performed an EGD which revealed three non-bleeding angiectasias in the stomach treated with argon plasma coagulation, with normal esophagus and duodenum.  Colonoscopy revealed red and coffee ground blood in distal ileum and ascending colon in the cecum, mild diverticulosis in the descending colon, and three 4-9 mm polyps in the ascending colon, one 6 mm polyp in the transverse colon, and one 6 mm polyp in the distal sigmoid colon, all of which were removed.  Octreotide  50 mcg BID was started inpatient, however it is not covered by his insurance.  GI discussed plan of action and referral to [facility] Charlean at [facility] after discharge for consideration of deep versus retrograde deep ostomy.  Prior to discharge patient was started on Eliquis 2.5 mg BID and his hemoglobin was monitored and stable at 8.1 prior to discharge.  AKI Creatinine of 1.98 on admission with unknown baseline, suspected to be prerenal in setting of blood loss anemia.  Caution with IV fluids given significant heart failure (LVEF of 30-35%). Cr continued to improved throughout admission.   MSSA infection of the left to right femoral bypass graft Graft infection on 08/25/2023, s/p washout with antibiotic beads.  Received 6-week course of IV Ancef , then transition to oral cefadroxil  on 10/11/2023.  Continued oral cefadroxil  1000 mg twice daily on admission.  Spoke with ID who recommended decreasing cefadroxil  to 500 mg twice daily.  ID to follow outpatient.  T2DM Poorly controlled type 2 diabetes, A1c greater than 10.  Held Jardiance  given elevated A1c.  Patient self discontinued glipizide , and nonadherent to home insulin  2/2 not feeling well.  Blood sugar was controlled  with basal/sliding scale and his regimen at time of discharge was 20 units of Lantus , to be titrated outpatient back to his home regimen as able.  HTN Continued home amlodipine  and carvedilol  continued while inpatient.  However held Jardiance ,  Lasix , and isosorbide  dinitrate upon discharge due to low pressures, restart as indicated outpatient per PCP.  Other chronic conditions were medically managed with home medications and formulary alternatives as necessary (PAD/PVD, CAD, BPH)  PCP Follow-up recommendations: Ensure follow-up with ID outpatient ~03/18/24 Will need weekly CBC checks given hx of bleeds per GI. Please begin checks on 11/10 or 02/27/24.  Follow up Kidney function on 11/10 or 02/27/24.  Ensure IV iron infusions (should get 2 more, weekly, outpatient)  Results/Tests Pending at Time of Discharge:  Unresulted Labs (From admission, onward)     Start     Ordered   02/25/24 0500  CBC  Daily,   R     Question:  Specimen collection method  Answer:  Lab=Lab collect  Placed in And Linked Group   02/24/24 0705           Disposition: Home  Discharge Condition: Stable  Discharge Exam:  Vitals:   02/25/24 0526 02/25/24 0811  BP: (!) 119/57 138/69  Pulse: 81 85  Resp: 18   Temp: 98.1 F (36.7 C)   SpO2: 96%    General: Awake and Alert in NAD HEENT: NCAT. Sclera anicteric. No rhinorrhea. Cardiovascular: RRR. No M/R/G Respiratory: CTAB, normal WOB on RA. No wheezing, crackles, rhonchi, or diminished breath sounds. Abdomen: Soft, non-tender, non-distended. Bowel sounds normoactive Extremities: No BLE edema, no deformities or significant joint findings. Skin: Warm and dry. No abrasions or rashes noted. Neuro: A&Ox3. No focal neurological deficits.  Significant Procedures: EGD and Colonoscopy EGD 11/4 - Normal esophagus and duodenum - 3 nonbleeding angioectasias in the stomach.  Treated with argon plasma coagulation  Colonoscopy 11/4 Red and coffee ground blood in distal ileum and ascending colon in the cecum, mild diverticulosis in the descending colon, and three 4-9 mm polyps in the ascending colon, one 6 mm polyp in the transverse colon, and one 6 mm polyp in the distal sigmoid colon, all of which were  removed.  Significant Labs and Imaging:  Recent Labs  Lab 02/24/24 0623 02/24/24 1557 02/25/24 0523  WBC 6.8 6.0 5.3  HGB 8.2* 8.5* 8.1*  HCT 26.6* 27.8* 27.0*  PLT 247 224 268   Recent Labs  Lab 02/25/24 0523  NA 139  K 3.9  CL 104  CO2 23  GLUCOSE 137*  BUN 16  CREATININE 1.90*  CALCIUM  8.5*  MG 1.9    Discharge Medications:  Allergies as of 02/25/2024       Reactions   Zestril  [lisinopril ] Swelling, Rash   Facial and tongue swelling   Levaquin [levofloxacin] Other (See Comments)   Unknown reaction        Medication List     PAUSE taking these medications    empagliflozin  10 MG Tabs tablet Wait to take this until your doctor or other care provider tells you to start again. Commonly known as: Jardiance  Take 1 tablet (10 mg total) by mouth daily.   furosemide  20 MG tablet Wait to take this until your doctor or other care provider tells you to start again. Commonly known as: Lasix  Take 1 tablet (20 mg total) by mouth every morning. What changed: when to take this   isosorbide  dinitrate 10 MG tablet Wait to take this until your doctor or  other care provider tells you to start again. Commonly known as: ISORDIL  Take 1 tablet (10 mg total) by mouth 2 (two) times daily.       STOP taking these medications    rivaroxaban  20 MG Tabs tablet Commonly known as: XARELTO        TAKE these medications    acetaminophen  500 MG tablet Commonly known as: TYLENOL  Take 1,000 mg by mouth every 6 (six) hours as needed for headache or fever (pain).   amLODipine  5 MG tablet Commonly known as: NORVASC  TAKE 1 TABLET(5 MG) BY MOUTH DAILY   apixaban 2.5 MG Tabs tablet Commonly known as: ELIQUIS Take 1 tablet (2.5 mg total) by mouth 2 (two) times daily.   aspirin  EC 81 MG tablet Take 1 tablet (81 mg total) by mouth daily. Swallow whole.   carvedilol  12.5 MG tablet Commonly known as: COREG  Take 1 tablet (12.5 mg total) by mouth 2 (two) times daily with a  meal.   cefadroxil  500 MG capsule Commonly known as: DURICEF Take 1 capsule (500 mg total) by mouth 2 (two) times daily. What changed: how much to take   ezetimibe  10 MG tablet Commonly known as: ZETIA  Take 1 tablet (10 mg total) by mouth daily.   glipiZIDE  5 MG tablet Commonly known as: GLUCOTROL  Take 1 tablet (5 mg total) by mouth 2 (two) times daily before a meal.   insulin  glargine 100 unit/mL Sopn Commonly known as: LANTUS  Inject 20 Units into the skin daily. 46 units subcut daily at nighttime What changed: how much to take   Insulin  Pen Needle 32G X 4 MM Misc 1 Device by Does not apply route daily in the afternoon.   nitroGLYCERIN  0.4 MG SL tablet Commonly known as: NITROSTAT  Place 0.4 mg under the tongue every 5 (five) minutes x 3 doses as needed for chest pain.   pantoprazole  40 MG tablet Commonly known as: PROTONIX  Take 40 mg by mouth 2 (two) times daily.   rosuvastatin  40 MG tablet Commonly known as: CRESTOR  TAKE 1 TABLET(40 MG) BY MOUTH DAILY   tirzepatide  5 MG/0.5ML Pen Commonly known as: MOUNJARO  Inject 5 mg into the skin once a week. What changed: when to take this        Discharge Instructions: Please refer to Patient Instructions section of EMR for full details.  Patient was counseled important signs and symptoms that should prompt return to medical care, changes in medications, dietary instructions, activity restrictions, and follow up appointments.   Follow-Up Appointments:  Follow-up Information     Fleeta Valeria Mayo, MD. Schedule an appointment as soon as possible for a visit.   Specialty: Internal Medicine Contact information: [address] [place] [address] [phone]                 Janna Ferrier, DO 02/25/2024, 7:41 AM PGY-2, [facility]   I have verified that the service and findings are accurately documented in the student's note.  Damien Cassis, MD                  02/25/2024, 9:19 AM

## 2024-02-25 NOTE — Progress Notes (Signed)
 Abelino E Mihelich to be D/C'd  per MD order.  Discussed with the patient and all questions fully answered.  VSS, Skin clean, dry and intact without evidence of skin break down, no evidence of skin tears noted.  IV catheters discontinued intact. Sites without signs and symptoms of complications. Dressing and pressure applied.  An After Visit Summary was printed and given to the patient. Family picked up meds from the [facility] pharmacy.  D/c education completed with patient/family including follow up instructions, medication list, d/c activities limitations if indicated, with other d/c instructions as indicated by MD - patient able to verbalize understanding, all questions fully answered.   Patient instructed to return to ED, call 911, or call MD for any changes in condition.   Patient to be escorted via WC, and D/C home via private auto with family.

## 2024-02-26 ENCOUNTER — Telehealth: Payer: Self-pay

## 2024-02-26 ENCOUNTER — Telehealth (HOSPITAL_COMMUNITY): Payer: Self-pay | Admitting: Family Medicine

## 2024-02-26 ENCOUNTER — Telehealth: Payer: Self-pay | Admitting: Pharmacy Technician

## 2024-02-26 ENCOUNTER — Other Ambulatory Visit: Payer: Self-pay

## 2024-02-26 DIAGNOSIS — K922 Gastrointestinal hemorrhage, unspecified: Secondary | ICD-10-CM

## 2024-02-26 DIAGNOSIS — D509 Iron deficiency anemia, unspecified: Secondary | ICD-10-CM | POA: Insufficient documentation

## 2024-02-26 NOTE — Telephone Encounter (Signed)
 Patient referred to infusion pharmacy team for ambulatory infusion of IV iron.  Insurance - [employer], [employer] of care - Site of care: [facility] Dx code - D64.9 IV Iron Therapy - Feraheme 510 mg IV x 2  Infusion appointments - Scheduling team will schedule patient as soon as possible.   Brier Reid D. Trenna Kiely, PharmD

## 2024-02-26 NOTE — Telephone Encounter (Addendum)
 Auth Submission: NO AUTH NEEDED Site of care: [facility] Payer: BCBS MEDICARE Medication & CPT/J Code(s) submitted: Feraheme (ferumoxytol) [id number] Diagnosis Code: D50.9 Route of submission (phone, fax, portal):  Phone # Fax # Auth type: PB Units/visits requested: X2 DOSES Reference number: [id number] Approval from: 02/26/24 to 04/17/24

## 2024-02-26 NOTE — Telephone Encounter (Signed)
-----   Message from [name] HERO Pyrtle sent at 02/24/2024  2:40 PM EST ----- Please refer patient to Dr. Rolan Clas at [facility] for mid and distal small bowel angioectasias with recurrent bleeding/occult bleeding  I also please have him complete a CBC at [facility] in [place] on Tuesday, 02/27/2024 and fax these results to me We will check a ferritin and IBC panel with that lab  Thank you

## 2024-02-26 NOTE — Telephone Encounter (Signed)
Referral placed to [facility].  

## 2024-02-26 NOTE — Telephone Encounter (Signed)
 Informed patient Dr. Albertus wants him to go to the [facility] in [place] tomorrow 02/26/24 for repeat labs. Also, informed patient I am referring him to [facility] and they will contact him with an appointment. Patient verbalized understanding. Lab orders placed and requisition faxed to [facility] in [place].

## 2024-02-27 ENCOUNTER — Other Ambulatory Visit (HOSPITAL_COMMUNITY): Payer: Self-pay

## 2024-02-27 ENCOUNTER — Other Ambulatory Visit: Payer: Self-pay | Admitting: Internal Medicine

## 2024-02-27 DIAGNOSIS — K922 Gastrointestinal hemorrhage, unspecified: Secondary | ICD-10-CM | POA: Diagnosis not present

## 2024-02-28 ENCOUNTER — Ambulatory Visit: Payer: Self-pay | Admitting: Internal Medicine

## 2024-02-28 LAB — CBC WITH DIFFERENTIAL/PLATELET
Basophils Absolute: 0 x10E3/uL (ref 0.0–0.2)
Basos: 0 %
EOS (ABSOLUTE): 0.1 x10E3/uL (ref 0.0–0.4)
Eos: 1 %
Hematocrit: 31.3 % — ABNORMAL LOW (ref 37.5–51.0)
Hemoglobin: 9.4 g/dL — ABNORMAL LOW (ref 13.0–17.7)
Immature Grans (Abs): 0 x10E3/uL (ref 0.0–0.1)
Immature Granulocytes: 0 %
Lymphocytes Absolute: 0.7 x10E3/uL (ref 0.7–3.1)
Lymphs: 9 %
MCH: 26.4 pg — ABNORMAL LOW (ref 26.6–33.0)
MCHC: 30 g/dL — ABNORMAL LOW (ref 31.5–35.7)
MCV: 88 fL (ref 79–97)
Monocytes Absolute: 0.5 x10E3/uL (ref 0.1–0.9)
Monocytes: 6 %
Neutrophils Absolute: 6.4 x10E3/uL (ref 1.4–7.0)
Neutrophils: 84 %
Platelets: 272 x10E3/uL (ref 150–450)
RBC: 3.56 x10E6/uL — ABNORMAL LOW (ref 4.14–5.80)
RDW: 19.8 % — ABNORMAL HIGH (ref 11.6–15.4)
WBC: 7.7 x10E3/uL (ref 3.4–10.8)

## 2024-02-28 LAB — IRON AND TIBC
Iron Saturation: 10 % — ABNORMAL LOW (ref 15–55)
Iron: 26 ug/dL — ABNORMAL LOW (ref 38–169)
Total Iron Binding Capacity: 272 ug/dL (ref 250–450)
UIBC: 246 ug/dL (ref 111–343)

## 2024-02-28 LAB — FERRITIN: Ferritin: 78 ng/mL (ref 30–400)

## 2024-02-29 ENCOUNTER — Inpatient Hospital Stay: Admitting: Internal Medicine

## 2024-02-29 ENCOUNTER — Other Ambulatory Visit: Payer: Self-pay

## 2024-02-29 DIAGNOSIS — K922 Gastrointestinal hemorrhage, unspecified: Secondary | ICD-10-CM

## 2024-03-04 ENCOUNTER — Ambulatory Visit: Admitting: Internal Medicine

## 2024-03-04 ENCOUNTER — Encounter: Payer: Self-pay | Admitting: Internal Medicine

## 2024-03-04 VITALS — BP 124/70 | HR 93 | Temp 97.6°F | Resp 18 | Ht 67.0 in | Wt 178.0 lb

## 2024-03-04 DIAGNOSIS — D500 Iron deficiency anemia secondary to blood loss (chronic): Secondary | ICD-10-CM

## 2024-03-04 DIAGNOSIS — N401 Enlarged prostate with lower urinary tract symptoms: Secondary | ICD-10-CM | POA: Diagnosis not present

## 2024-03-04 DIAGNOSIS — C679 Malignant neoplasm of bladder, unspecified: Secondary | ICD-10-CM | POA: Diagnosis not present

## 2024-03-04 DIAGNOSIS — N1832 Chronic kidney disease, stage 3b: Secondary | ICD-10-CM

## 2024-03-04 MED ORDER — ACCRUFER 30 MG PO CAPS: 1.0000 | ORAL_CAPSULE | Freq: Two times a day (BID) | ORAL | 2 refills | Status: AC

## 2024-03-04 NOTE — Assessment & Plan Note (Signed)
 Plan as below.

## 2024-03-04 NOTE — Assessment & Plan Note (Signed)
 We will repeat his CMP and if his kidney function is stable, he can restart jardiance  and lasix .

## 2024-03-04 NOTE — Progress Notes (Signed)
 Office Visit  Subjective   Patient ID: [name]   DOB: Aug 16, 1945   Age: 78 y.o.   MRN: [id number]   Chief Complaint Chief Complaint  Patient presents with   Follow-up    Hospital     History of Present Illness [name] is a 78 yo male who returns today for a hospital followup where he was admitted to [facility] hospital from 02/18/2024 until 02/25/2024 with acute anemia due to GI blood loss.  The patient began having BRBPR where he presented to the ER.  He have a previous admission in September for symptomatic anemia suspected to be GI bleed.  GI had planned for outpatient colonoscopy/EGD on 02/20/2024 but returned with symptomatic anemia. Hemoglobin 7.2 with positive fecal occult on admission.  Started on IV Protonix , held Xarelto  but continued aspirin  given his significant PAD/vascular history.  GI consulted and performed an EGD which revealed three non-bleeding angiectasias in the stomach treated with argon plasma coagulation, with normal esophagus and duodenum.  Colonoscopy revealed red and coffee ground blood in distal ileum and ascending colon in the cecum, mild diverticulosis in the descending colon, and three 4-9 mm polyps in the ascending colon, one 6 mm polyp in the transverse colon, and one 6 mm polyp in the distal sigmoid colon, all of which were removed.  Octreotide  50 mcg BID was started inpatient, however it is not covered by his insurance.  GI discussed plan of action and referral to [name] at [facility] after discharge for consideration of deep versus retrograde deep ostomy.  Prior to discharge patient was started on Eliquis 2.5 mg BID and his hemoglobin was monitored and stable at 8.1 prior to discharge.  He had a GI nuclear bleeding test which was negative.  They held his lasix  and jardiance  due to acute on CKD.   He had a prior MSSA infection of the left to right femoral bypass graft with graft infection on 08/25/2023, s/p washout with antibiotic beads.  Received 6-week course of IV  Ancef , then transition to oral cefadroxil  on 10/11/2023.  Continued oral cefadroxil  1000 mg twice daily on admission.  Hospitalists spoke with ID who recommended decreasing cefadroxil  to 500 mg twice daily.  ID to follow outpatient with followup with them on 03/18/2024. He has 3 infusions of IV iron infusions over the last 2 months.  They felt he should get 2 more infusions.       Past Medical History Past Medical History:  Diagnosis Date   Acute low back pain 01/12/2015   Acute lower limb ischemia 07/11/2023   Acute pain of both shoulders 07/22/2022   AKI (acute kidney injury) 05/24/2022   Arthritis    Atherosclerosis of native arteries of the extremities with intermittent claudication 08/13/2013   Atherosclerosis of native artery of extremity with intermittent claudication 08/13/2013   IMO SNOMED Dx Update Oct 2024     Backache 02/08/2013   Benign prostatic hyperplasia with urinary frequency 02/23/2023   Bladder cancer (HCC)    resection x3   BMI 27.0-27.9,adult 02/23/2023   Cellulitis 07/17/2023   Chronic combined systolic (congestive) and diastolic (congestive) heart failure (HCC) 07/22/2022   Chronic left shoulder pain 05/24/2023   Chronic right shoulder pain 09/02/2022   Chronic systolic CHF (congestive heart failure) (HCC)    CKD stage 3b, GFR 30-44 ml/min (HCC) 07/03/2023   Coronary artery disease    status post DMI RX Taxus stent RCA 2006 with susequent Stent LAD and subsequent  stent  thrombosis RCA unable to be opened 2006 -neg mv 10/2008, 10/16/17 ISR to pLAD with PTCA/DES, CTO of RCA with collaterals, EF 25%   Coronary artery disease involving native coronary artery of native heart with unstable angina pectoris (HCC)    Current moderate episode of major depressive disorder without prior episode (HCC) 03/28/2022   Diverticulitis 06/10/2022   Diverticulitis large intestine 05/11/2021   Diverticulitis of large intestine with abscess 05/24/2022   Diverticulitis of sigmoid colon  05/11/2021   DM2 (diabetes mellitus, type 2) (HCC) 02/09/2022   status post bilateral aortobifemoral bypass [id number] with recent fem to fembypass April  2011 per DR. Early     Dyssynergic defecation 03/21/2023   Essential hypertension 07/22/2022   Fatigue 03/13/2023   History of bladder cancer 02/23/2023   History of colonic diverticulitis 03/21/2023   Hypercholesterolemia 10/28/2022   HYPERLIPIDEMIA-MIXED 09/22/2008   Qualifier: Diagnosis of  By: Justina Kos     Hypothyroidism 05/24/2022   Ischemic cardiomyopathy    ejection fraction of 40-45%   Lethargy 03/28/2022   Lumbar spinal stenosis    Myocardial infarction (HCC)    I've had 4 (10/16/2017)   Need for prophylactic vaccination and inoculation against influenza 03/28/2022   NSVT (nonsustained ventricular tachycardia) (HCC)    PAD (peripheral artery disease) 07/17/2023   PAF (paroxysmal atrial fibrillation) (HCC) 02/09/2022   Paroxysmal atrial fibrillation (HCC)    Peripheral arterial disease    Peripheral artery disease 07/09/2023   Poorly controlled T2 diabetes mellitus (HCC) 09/27/2022   Protein-calorie malnutrition, severe 05/29/2022   PVC's (premature ventricular contractions)    PVD 09/22/2008   Qualifier: Diagnosis of  By: Justina Kos     PVD (peripheral vascular disease) 02/23/2023   Rotator cuff arthropathy of right shoulder 09/27/2022   Sigmoid diverticulitis 06/30/2023   Status post coronary artery stent placement    Status post Hartmann's procedure (HCC) 03/21/2023   TOBACCO ABUSE 05/29/2009   Qualifier: Diagnosis of  By: Velinda, RN, BSN, Avelina CROME    Trigger ring finger of right hand 03/29/2023   Type 2 diabetes mellitus with other circulatory complications (HCC) 07/22/2022   Type II diabetes mellitus (HCC)    Unstable angina (HCC) 10/16/2017     Allergies Allergies  Allergen Reactions   Zestril  [Lisinopril ] Swelling and Rash    Facial and tongue swelling   Levaquin [Levofloxacin] Other (See  Comments)    Unknown reaction     Medications  Current Outpatient Medications:    acetaminophen  (TYLENOL ) 500 MG tablet, Take 1,000 mg by mouth every 6 (six) hours as needed for headache or fever (pain)., Disp: , Rfl:    amLODipine  (NORVASC ) 5 MG tablet, TAKE 1 TABLET(5 MG) BY MOUTH DAILY, Disp: 90 tablet, Rfl: 2   apixaban (ELIQUIS) 2.5 MG TABS tablet, Take 1 tablet (2.5 mg total) by mouth 2 (two) times daily., Disp: 60 tablet, Rfl: 0   aspirin  EC 81 MG tablet, Take 1 tablet (81 mg total) by mouth daily. Swallow whole., Disp: 90 tablet, Rfl: 3   carvedilol  (COREG ) 12.5 MG tablet, Take 1 tablet (12.5 mg total) by mouth 2 (two) times daily with a meal., Disp: 180 tablet, Rfl: 3   ezetimibe  (ZETIA ) 10 MG tablet, Take 1 tablet (10 mg total) by mouth daily., Disp: 90 tablet, Rfl: 3   Ferric Maltol (ACCRUFER) 30 MG CAPS, Take 1 capsule (30 mg total) by mouth in the morning and at bedtime., Disp: 60 capsule, Rfl: 2   insulin  glargine (LANTUS ) 100 unit/mL SOPN,  Inject 20 Units into the skin daily and 46 units into the skin at nighttime, Disp: 30 mL, Rfl: 4   Insulin  Pen Needle 32G X 4 MM MISC, 1 Device by Does not apply route daily in the afternoon., Disp: 100 each, Rfl: 3   nitroGLYCERIN  (NITROSTAT ) 0.4 MG SL tablet, Place 0.4 mg under the tongue every 5 (five) minutes x 3 doses as needed for chest pain., Disp: , Rfl:    pantoprazole  (PROTONIX ) 40 MG tablet, Take 40 mg by mouth 2 (two) times daily., Disp: , Rfl:    rosuvastatin  (CRESTOR ) 40 MG tablet, TAKE 1 TABLET(40 MG) BY MOUTH DAILY, Disp: 90 tablet, Rfl: 1   tirzepatide  (MOUNJARO ) 5 MG/0.5ML Pen, Inject 5 mg into the skin once a week., Disp: 6 mL, Rfl: 2   cefadroxil  (DURICEF) 500 MG capsule, Take 1 capsule (500 mg total) by mouth 2 (two) times daily., Disp: 60 capsule, Rfl: 5   [Paused] empagliflozin  (JARDIANCE ) 10 MG TABS tablet, Take 1 tablet (10 mg total) by mouth daily., Disp: 90 tablet, Rfl: 3   [Paused] furosemide  (LASIX ) 20 MG tablet,  Take 1 tablet (20 mg total) by mouth every morning. (Patient taking differently: Take 20 mg by mouth every Monday, Wednesday, and Friday.), Disp: 90 tablet, Rfl: 1   glipiZIDE  (GLUCOTROL ) 5 MG tablet, Take 1 tablet (5 mg total) by mouth 2 (two) times daily before a meal. (Patient not taking: Reported on 02/19/2024), Disp: 180 tablet, Rfl: 3   [Paused] isosorbide  dinitrate (ISORDIL ) 10 MG tablet, Take 1 tablet (10 mg total) by mouth 2 (two) times daily. (Patient not taking: Reported on 02/19/2024), Disp: 180 tablet, Rfl: 3   Review of Systems Review of Systems  Constitutional:  Negative for chills, fever and malaise/fatigue.  Eyes:  Negative for blurred vision.  Respiratory:  Negative for hemoptysis and shortness of breath.   Cardiovascular:  Negative for chest pain and palpitations.  Gastrointestinal:  Negative for abdominal pain, blood in stool, constipation, diarrhea, melena, nausea and vomiting.  Neurological:  Negative for dizziness, weakness and headaches.       Objective:    Vitals BP 124/70 (BP Location: Left Arm, Patient Position: Sitting, Cuff Size: Normal)   Pulse 93   Temp 97.6 F (36.4 C)   Resp 18   Ht 5' 7 (1.702 m)   Wt 178 lb (80.7 kg)   SpO2 99%   BMI 27.88 kg/m    Physical Examination Physical Exam Constitutional:      Appearance: Normal appearance. He is not ill-appearing.  Cardiovascular:     Rate and Rhythm: Normal rate and regular rhythm.     Pulses: Normal pulses.     Heart sounds: No murmur heard.    No friction rub. No gallop.  Pulmonary:     Effort: Pulmonary effort is normal. No respiratory distress.     Breath sounds: No wheezing, rhonchi or rales.  Abdominal:     General: Abdomen is flat. Bowel sounds are normal. There is no distension.     Palpations: Abdomen is soft.     Tenderness: There is no abdominal tenderness.  Musculoskeletal:     Right lower leg: No edema.     Left lower leg: No edema.  Skin:    General: Skin is warm and dry.      Findings: No rash.  Neurological:     Mental Status: He is alert.        Assessment & Plan:   Anemia due to blood  loss He had a GIB which he states today he has not seen any further blood.  He is supposed to be on OTC iron but he never went to go get this. We will start him on accufer and get him setup for another IV iron infusion at [facility]. We will check his CBC again today and again next week.  He is to continue on his protonix   Iron deficiency anemia Plan as below.  CKD stage 3b, GFR 30-44 ml/min (HCC) We will repeat his CMP and if his kidney function is stable, he can restart jardiance  and lasix .    No follow-ups on file.   Selinda Fleeta Finger, MD

## 2024-03-04 NOTE — Assessment & Plan Note (Signed)
 He had a GIB which he states today he has not seen any further blood.  He is supposed to be on OTC iron but he never went to go get this. We will start him on accufer and get him setup for another IV iron infusion at [facility]. We will check his CBC again today and again next week.  He is to continue on his protonix 

## 2024-03-05 LAB — CBC WITH DIFFERENTIAL/PLATELET
Basophils Absolute: 0.1 x10E3/uL (ref 0.0–0.2)
Basos: 1 %
EOS (ABSOLUTE): 0.1 x10E3/uL (ref 0.0–0.4)
Eos: 2 %
Hematocrit: 32.9 % — ABNORMAL LOW (ref 37.5–51.0)
Hemoglobin: 9.6 g/dL — ABNORMAL LOW (ref 13.0–17.7)
Immature Grans (Abs): 0 x10E3/uL (ref 0.0–0.1)
Immature Granulocytes: 0 %
Lymphocytes Absolute: 1.2 x10E3/uL (ref 0.7–3.1)
Lymphs: 21 %
MCH: 25.3 pg — ABNORMAL LOW (ref 26.6–33.0)
MCHC: 29.2 g/dL — ABNORMAL LOW (ref 31.5–35.7)
MCV: 87 fL (ref 79–97)
Monocytes Absolute: 0.5 x10E3/uL (ref 0.1–0.9)
Monocytes: 9 %
Neutrophils Absolute: 3.8 x10E3/uL (ref 1.4–7.0)
Neutrophils: 67 %
Platelets: 252 x10E3/uL (ref 150–450)
RBC: 3.8 x10E6/uL — ABNORMAL LOW (ref 4.14–5.80)
RDW: 19.6 % — ABNORMAL HIGH (ref 11.6–15.4)
WBC: 5.6 x10E3/uL (ref 3.4–10.8)

## 2024-03-05 LAB — CMP14 + ANION GAP
ALT: 12 IU/L (ref 0–44)
AST: 16 IU/L (ref 0–40)
Albumin: 4.1 g/dL (ref 3.8–4.8)
Alkaline Phosphatase: 80 IU/L (ref 47–123)
Anion Gap: 16 mmol/L (ref 10.0–18.0)
BUN/Creatinine Ratio: 6 — ABNORMAL LOW (ref 10–24)
BUN: 13 mg/dL (ref 8–27)
Bilirubin Total: 0.3 mg/dL (ref 0.0–1.2)
CO2: 20 mmol/L (ref 20–29)
Calcium: 9.2 mg/dL (ref 8.6–10.2)
Chloride: 105 mmol/L (ref 96–106)
Creatinine, Ser: 2.01 mg/dL — ABNORMAL HIGH (ref 0.76–1.27)
Globulin, Total: 2.1 g/dL (ref 1.5–4.5)
Glucose: 136 mg/dL — ABNORMAL HIGH (ref 70–99)
Potassium: 4.8 mmol/L (ref 3.5–5.2)
Sodium: 141 mmol/L (ref 134–144)
Total Protein: 6.2 g/dL (ref 6.0–8.5)
eGFR: 33 mL/min/1.73 — ABNORMAL LOW (ref >59)

## 2024-03-06 ENCOUNTER — Ambulatory Visit (INDEPENDENT_AMBULATORY_CARE_PROVIDER_SITE_OTHER): Admitting: Internal Medicine

## 2024-03-06 ENCOUNTER — Encounter: Payer: Self-pay | Admitting: Internal Medicine

## 2024-03-06 ENCOUNTER — Other Ambulatory Visit: Payer: Self-pay

## 2024-03-06 ENCOUNTER — Encounter (HOSPITAL_COMMUNITY): Payer: Self-pay | Admitting: Family Medicine

## 2024-03-06 ENCOUNTER — Ambulatory Visit: Admitting: Internal Medicine

## 2024-03-06 VITALS — BP 134/68 | Ht 67.0 in | Wt 177.0 lb

## 2024-03-06 DIAGNOSIS — Z794 Long term (current) use of insulin: Secondary | ICD-10-CM | POA: Diagnosis not present

## 2024-03-06 DIAGNOSIS — E1159 Type 2 diabetes mellitus with other circulatory complications: Secondary | ICD-10-CM | POA: Diagnosis not present

## 2024-03-06 DIAGNOSIS — E1122 Type 2 diabetes mellitus with diabetic chronic kidney disease: Secondary | ICD-10-CM | POA: Diagnosis not present

## 2024-03-06 DIAGNOSIS — N1831 Chronic kidney disease, stage 3a: Secondary | ICD-10-CM

## 2024-03-06 LAB — POCT GLYCOSYLATED HEMOGLOBIN (HGB A1C): Hemoglobin A1C: 6.1 % — AB (ref 4.0–5.6)

## 2024-03-06 MED ORDER — TIRZEPATIDE 5 MG/0.5ML ~~LOC~~ SOAJ: 5.0000 mg | SUBCUTANEOUS | 2 refills | Status: AC

## 2024-03-06 MED ORDER — INSULIN GLARGINE 100 UNITS/ML SOLOSTAR PEN: 60.0000 [IU] | PEN_INJECTOR | Freq: Every day | SUBCUTANEOUS | 3 refills | Status: AC

## 2024-03-06 NOTE — Patient Instructions (Addendum)
 Continue Mounjaro  5 mg once weekly Continue Lantus  20 units every morning and 40 units every night       HOW TO TREAT LOW BLOOD SUGARS (Blood sugar LESS THAN 70 MG/DL) Please follow the RULE OF 15 for the treatment of hypoglycemia treatment (when your (blood sugars are less than 70 mg/dL)   STEP 1: Take 15 grams of carbohydrates when your blood sugar is low, which includes:  3-4 GLUCOSE TABS  OR 3-4 OZ OF JUICE OR REGULAR SODA OR ONE TUBE OF GLUCOSE GEL    STEP 2: RECHECK blood sugar in 15 MINUTES STEP 3: If your blood sugar is still low at the 15 minute recheck --> then, go back to STEP 1 and treat AGAIN with another 15 grams of carbohydrates.

## 2024-03-06 NOTE — Progress Notes (Signed)
 Name: [name]  MRN/ DOB: [id number], [birth year]   Age/ Sex: 78 y.o., male    PCP: Fleeta Valeria Mayo, MD   Reason for Endocrinology Evaluation: Type 2 Diabetes Mellitus     Date of Initial Endocrinology Visit: 01/04/2024    PATIENT IDENTIFIER: [name] is a 78 y.o. male with a past medical history of DM, HTN, and dyslipidemia, CAD, paroxysmal A-fib, Hx bladder cancer. The patient presented for initial endocrinology clinic visit on 01/04/2024 for consultative assistance with his diabetes management.    HPI: [name]    Diagnosed with DM many years ago Prior Medications tried/Intolerance: Ozempic  - insurance issues . Started Trulicity  11/2023 Hemoglobin A1c has ranged from 7.4% in 2024, peaking at 10.6% in 2025.  On his initial visit to our clinic he had an A1c of 10.5%, he was on Jardiance , Trulicity , Lantus , he had NovoLog  and his medication list that he was not taking.  I did continue his Jardiance , I started him on glipizide , switch Trulicity  to Mounjaro , adjusted his Lantus  dose and we held the NovoLog  as he was not taking it and it was causing him hardship   SUBJECTIVE:   During the last visit (01/04/2024): A1c 10.5%  Today (03/06/24): [name] is here for follow-up on diabetes management.  He checks blood sugars multiple times daily. The patient has not had hypoglycemic episodes since the last clinic visit.   He is accompanied by his girlfriend Since his last visit here, he underwent EGD/colonoscopy for iron deficiency anemia due to blood loss.  The patient has had previous admission for this Jardiance  was held during hospitalization due to  AKI Patient follows with cardiology for CHF   No nausea or vomiting  No constipation or diarrhea   Patient also follows with urology  HOME DIABETES REGIMEN: Glipizide  5 mg twice daily- no taking  Jardiance  10 mg daily- on hold  Mounjaro  5 mg weekly Lantus  40 units daily - 20 units QAM and 40 at night     Statin:  Yes ACE-I/ARB: Intolerant to lisinopril  due to face, tongue swelling and rash    CONTINUOUS GLUCOSE MONITORING RECORD INTERPRETATION    Dates of Recording:11/5-11/18/2025  Sensor description: Freestyle libre 3  Results statistics:   CGM use % of time 97  Average and SD 163/27.3  Time in range 69  %  % Time Above 180 27  % Time above 250 4  % Time Below target 0   Glycemic patterns summary: BGs are optimal overnight and fluctuate during the day  Hyperglycemic episodes postprandial  Hypoglycemic episodes occurred N/A  Overnight periods: Optimal    DIABETIC COMPLICATIONS: Microvascular complications:  CKD,  Denies:DR Last eye exam: Completed 2025  Macrovascular complications:  CAD, CHF, PAD Denies: CVA   PAST HISTORY: Past Medical History:  Past Medical History:  Diagnosis Date   Acute low back pain 01/12/2015   Acute lower limb ischemia 07/11/2023   Acute pain of both shoulders 07/22/2022   AKI (acute kidney injury) 05/24/2022   Arthritis    Atherosclerosis of native arteries of the extremities with intermittent claudication 08/13/2013   Atherosclerosis of native artery of extremity with intermittent claudication 08/13/2013   IMO SNOMED Dx Update Oct 2024     Backache 02/08/2013   Benign prostatic hyperplasia with urinary frequency 02/23/2023   Bladder cancer (HCC)    resection x3   BMI 27.0-27.9,adult 02/23/2023   Cellulitis 07/17/2023   Chronic combined systolic (congestive) and diastolic (congestive) heart failure (  HCC) 07/22/2022   Chronic left shoulder pain 05/24/2023   Chronic right shoulder pain 09/02/2022   Chronic systolic CHF (congestive heart failure) (HCC)    CKD stage 3b, GFR 30-44 ml/min (HCC) 07/03/2023   Coronary artery disease    status post DMI RX Taxus stent RCA 2006 with susequent Stent LAD and subsequent  stent thrombosis RCA unable to be opened 2006 -neg mv 10/2008, 10/16/17 ISR to pLAD with PTCA/DES, CTO of RCA with collaterals, EF  25%   Coronary artery disease involving native coronary artery of native heart with unstable angina pectoris (HCC)    Current moderate episode of major depressive disorder without prior episode (HCC) 03/28/2022   Diverticulitis 06/10/2022   Diverticulitis large intestine 05/11/2021   Diverticulitis of large intestine with abscess 05/24/2022   Diverticulitis of sigmoid colon 05/11/2021   DM2 (diabetes mellitus, type 2) (HCC) 02/09/2022   status post bilateral aortobifemoral bypass [id number] with recent fem to fembypass April  2011 per DR. Early     Dyssynergic defecation 03/21/2023   Essential hypertension 07/22/2022   Fatigue 03/13/2023   History of bladder cancer 02/23/2023   History of colonic diverticulitis 03/21/2023   Hypercholesterolemia 10/28/2022   HYPERLIPIDEMIA-MIXED 09/22/2008   Qualifier: Diagnosis of  By: Justina Kos     Hypothyroidism 05/24/2022   Ischemic cardiomyopathy    ejection fraction of 40-45%   Lethargy 03/28/2022   Lumbar spinal stenosis    Myocardial infarction (HCC)    I've had 4 (10/16/2017)   Need for prophylactic vaccination and inoculation against influenza 03/28/2022   NSVT (nonsustained ventricular tachycardia) (HCC)    PAD (peripheral artery disease) 07/17/2023   PAF (paroxysmal atrial fibrillation) (HCC) 02/09/2022   Paroxysmal atrial fibrillation (HCC)    Peripheral arterial disease    Peripheral artery disease 07/09/2023   Poorly controlled T2 diabetes mellitus (HCC) 09/27/2022   Protein-calorie malnutrition, severe 05/29/2022   PVC's (premature ventricular contractions)    PVD 09/22/2008   Qualifier: Diagnosis of  By: Justina Kos     PVD (peripheral vascular disease) 02/23/2023   Rotator cuff arthropathy of right shoulder 09/27/2022   Sigmoid diverticulitis 06/30/2023   Status post coronary artery stent placement    Status post Hartmann's procedure (HCC) 03/21/2023   TOBACCO ABUSE 05/29/2009   Qualifier: Diagnosis of  By: Velinda, RN,  BSN, Avelina CROME    Trigger ring finger of right hand 03/29/2023   Type 2 diabetes mellitus with other circulatory complications (HCC) 07/22/2022   Type II diabetes mellitus (HCC)    Unstable angina (HCC) 10/16/2017   Past Surgical History:  Past Surgical History:  Procedure Laterality Date   ABDOMINAL AORTOGRAM W/LOWER EXTREMITY Right 07/10/2023   Procedure: ABDOMINAL AORTOGRAM W/LOWER EXTREMITY;  Surgeon: Pearline Norman RAMAN, MD;  Location: [facility] INVASIVE CV LAB;  Service: Cardiovascular;  Laterality: Right;   AORTA - BILATERAL FEMORAL ARTERY BYPASS GRAFT Bilateral 1992   APPENDECTOMY     APPLICATION OF WOUND VAC Left 08/26/2023   Procedure: APPLICATION, WOUND VAC left groin;  Surgeon: Sheree Penne Bruckner, MD;  Location: [facility] OR;  Service: Vascular;  Laterality: Left;   APPLICATION, SKIN SUBSTITUTE Left 08/29/2023   Procedure: APPLICATION, SKIN SUBSTITUTE;  Surgeon: Sheree Penne Bruckner, MD;  Location: [facility] OR;  Service: Vascular;  Laterality: Left;   BACK SURGERY     COLECTOMY WITH COLOSTOMY CREATION/HARTMANN PROCEDURE N/A 05/26/2022   Procedure: COLECTOMY WITH COLOSTOMY CREATION/HARTMANN PROCEDURE;  Surgeon: Paola Dreama SAILOR, MD;  Location: [facility] OR;  Service: General;  Laterality: N/A;  COLONOSCOPY N/A 02/20/2024   Procedure: COLONOSCOPY;  Surgeon: Albertus [name] HERO, MD;  Location: [facility] ENDOSCOPY;  Service: Gastroenterology;  Laterality: N/A;   CORONARY ANGIOPLASTY WITH STENT PLACEMENT     taxus stent  placed into his right coronary artery; stent placed to the LAD.     CORONARY STENT INTERVENTION N/A 10/16/2017   Procedure: CORONARY STENT INTERVENTION;  Surgeon: Verlin Lonni BIRCH, MD;  Location: [facility] INVASIVE CV LAB;  Service: Cardiovascular;  Laterality: N/A;   ESOPHAGOGASTRODUODENOSCOPY N/A 02/20/2024   Procedure: EGD (ESOPHAGOGASTRODUODENOSCOPY);  Surgeon: Albertus [name] HERO, MD;  Location: [facility] ENDOSCOPY;  Service: Gastroenterology;  Laterality: N/A;   FEMORAL-FEMORAL BYPASS GRAFT  07/2009   GIVENS  CAPSULE STUDY N/A 02/20/2024   Procedure: IMAGING PROCEDURE, GI TRACT, INTRALUMINAL, VIA CAPSULE;  Surgeon: Albertus [name] HERO, MD;  Location: [facility] ENDOSCOPY;  Service: Gastroenterology;  Laterality: N/A;   HOT HEMOSTASIS N/A 02/20/2024   Procedure: EGD, WITH ARGON PLASMA COAGULATION;  Surgeon: Albertus [name] HERO, MD;  Location: [facility] ENDOSCOPY;  Service: Gastroenterology;  Laterality: N/A;   INCISION AND DRAINAGE OF WOUND Left 08/26/2023   Procedure: IRRIGATION AND DEBRIDEMENT left groin  WOUND, application of antibiotic beads;  Surgeon: Sheree Penne Lonni, MD;  Location: [facility] OR;  Service: Vascular;  Laterality: Left;  WASHOUT LEFT GROIN   INCISION AND DRAINAGE OF WOUND Left 08/29/2023   Procedure: IRRIGATION AND DEBRIDEMENT WOUND;  Surgeon: Sheree Penne Lonni, MD;  Location: [facility] OR;  Service: Vascular;  Laterality: Left;  WASHOUT LEFT GROIN REPEAT   LAPAROTOMY N/A 05/26/2022   Procedure: EXPLORATORY LAPAROTOMY;  Surgeon: Paola Dreama SAILOR, MD;  Location: [facility] OR;  Service: General;  Laterality: N/A;   LOWER EXTREMITY INTERVENTION Right 07/10/2023   Procedure: LOWER EXTREMITY INTERVENTION;  Surgeon: Pearline Norman RAMAN, MD;  Location: [facility] INVASIVE CV LAB;  Service: Cardiovascular;  Laterality: Right;   LUMBAR MICRODISCECTOMY  12/31/08   LYSIS OF ADHESION N/A 06/30/2023   Procedure: LAPAROSCOPIC LYSIS OF ADHESIONS;  Surgeon: Sheldon Standing, MD;  Location: [facility] ORS;  Service: General;  Laterality: N/A;   PATCH ANGIOPLASTY Left 08/26/2023   Procedure: Primary repair of left femoral graftotomy;  Surgeon: Sheree Penne Lonni, MD;  Location: [facility] OR;  Service: Vascular;  Laterality: Left;   PERIPHERAL VASCULAR THROMBECTOMY Right 07/10/2023   Procedure: PERIPHERAL VASCULAR THROMBECTOMY;  Surgeon: Pearline Norman RAMAN, MD;  Location: [facility] INVASIVE CV LAB;  Service: Cardiovascular;  Laterality: Right;   PROCTOSCOPY N/A 06/30/2023   Procedure: Flexible sigmoidoscopy;  Surgeon: Sheldon Standing, MD;  Location: [facility] ORS;  Service:  General;  Laterality: N/A;   RIGHT/LEFT HEART CATH AND CORONARY ANGIOGRAPHY N/A 10/16/2017   Procedure: RIGHT/LEFT HEART CATH AND CORONARY ANGIOGRAPHY;  Surgeon: Verlin Lonni BIRCH, MD;  Location: [facility] INVASIVE CV LAB;  Service: Cardiovascular;  Laterality: N/A;   SHOULDER OPEN ROTATOR CUFF REPAIR Left    VACUUM ASSISTED CLOSURE CHANGE Left 08/29/2023   Procedure: REPLACEMENT, WOUND VAC DRESSING, GROIN;  Surgeon: Sheree Penne Lonni, MD;  Location: [facility] OR;  Service: Vascular;  Laterality: Left;   XI ROBOTIC ASSISTED COLOSTOMY TAKEDOWN N/A 06/30/2023   Procedure: XI ROBOTIC ASSISTED OSTOMY TAKEDOWN, PARTIAL RECTOSIGMOID RESECTION, BILATERAL TAP BLOCK;  Surgeon: Sheldon Standing, MD;  Location: [facility] ORS;  Service: General;  Laterality: N/A;    Social History:  reports that he quit smoking about 3 years ago. His smoking use included cigars and cigarettes. He started smoking about 59 years ago. He has a 28 pack-year smoking history. He has quit using smokeless tobacco.  His smokeless tobacco  use included chew. He reports current alcohol use. He reports that he does not use drugs. Family History:  Family History  Problem Relation Age of Onset   Coronary artery disease Unknown    Diabetes Unknown    Cardiomyopathy Mother    Heart disease Mother    Hyperlipidemia Mother    Coronary artery disease Father    Stroke Father    Diabetes Father    Heart disease Father        before age 59   Hyperlipidemia Father    Heart attack Father    Diabetes Sister    Heart disease Sister        before age 17   Hyperlipidemia Sister    Heart attack Sister      HOME MEDICATIONS: Allergies as of 03/06/2024       Reactions   Zestril  [lisinopril ] Swelling, Rash   Facial and tongue swelling   Levaquin [levofloxacin] Other (See Comments)   Unknown reaction        Medication List        Accurate as of March 06, 2024  9:42 AM. If you have any questions, ask your nurse or doctor.           PAUSE taking these medications    empagliflozin  10 MG Tabs tablet Wait to take this until your doctor or other care provider tells you to start again. Commonly known as: Jardiance  Take 1 tablet (10 mg total) by mouth daily.   furosemide  20 MG tablet Wait to take this until your doctor or other care provider tells you to start again. Commonly known as: Lasix  Take 1 tablet (20 mg total) by mouth every morning.   isosorbide  dinitrate 10 MG tablet Wait to take this until your doctor or other care provider tells you to start again. Commonly known as: ISORDIL  Take 1 tablet (10 mg total) by mouth 2 (two) times daily.       TAKE these medications    ACCRUFeR 30 MG Caps Generic drug: Ferric Maltol Take 1 capsule (30 mg total) by mouth in the morning and at bedtime.   acetaminophen  500 MG tablet Commonly known as: TYLENOL  Take 1,000 mg by mouth every 6 (six) hours as needed for headache or fever (pain).   amLODipine  5 MG tablet Commonly known as: NORVASC  TAKE 1 TABLET(5 MG) BY MOUTH DAILY   aspirin  EC 81 MG tablet Take 1 tablet (81 mg total) by mouth daily. Swallow whole.   carvedilol  12.5 MG tablet Commonly known as: COREG  Take 1 tablet (12.5 mg total) by mouth 2 (two) times daily with a meal.   cefadroxil  500 MG capsule Commonly known as: DURICEF Take 1 capsule (500 mg total) by mouth 2 (two) times daily.   Eliquis 2.5 MG Tabs tablet Generic drug: apixaban Take 1 tablet (2.5 mg total) by mouth 2 (two) times daily.   ezetimibe  10 MG tablet Commonly known as: ZETIA  Take 1 tablet (10 mg total) by mouth daily.   glipiZIDE  5 MG tablet Commonly known as: GLUCOTROL  Take 1 tablet (5 mg total) by mouth 2 (two) times daily before a meal.   Insulin  Pen Needle 32G X 4 MM Misc 1 Device by Does not apply route daily in the afternoon.   Lantus  SoloStar 100 UNIT/ML Solostar Pen Generic drug: insulin  glargine Inject 20 Units into the skin daily and 46 units into the skin at  nighttime   nitroGLYCERIN  0.4 MG SL tablet Commonly known as: NITROSTAT  Place 0.4  mg under the tongue every 5 (five) minutes x 3 doses as needed for chest pain.   pantoprazole  40 MG tablet Commonly known as: PROTONIX  Take 40 mg by mouth 2 (two) times daily.   rosuvastatin  40 MG tablet Commonly known as: CRESTOR  TAKE 1 TABLET(40 MG) BY MOUTH DAILY   tirzepatide  5 MG/0.5ML Pen Commonly known as: MOUNJARO  Inject 5 mg into the skin once a week.         ALLERGIES: Allergies  Allergen Reactions   Zestril  [Lisinopril ] Swelling and Rash    Facial and tongue swelling   Levaquin [Levofloxacin] Other (See Comments)    Unknown reaction     REVIEW OF SYSTEMS: A comprehensive ROS was conducted with the patient and is negative except as per HPI    OBJECTIVE:   VITAL SIGNS: BP 134/68   Ht 5' 7 (1.702 m)   Wt 177 lb (80.3 kg)   BMI 27.72 kg/m    PHYSICAL EXAM:  General: Pt appears well and is in NAD  Neck: General: Supple without adenopathy or carotid bruits. Thyroid : Thyroid  size normal.  No goiter or nodules appreciated.   Lungs: Clear with good BS bilat   Heart: RRR   Neuro: MS is good with appropriate affect, pt is alert and Ox3    DM foot exam: 01/04/2024  The skin of the feet without sores or ulcerations with onychomycosis The pedal pulses are undetectable The sensation is intact to a screening 5.07, 10 gram monofilament bilaterally    DATA REVIEWED:  Lab Results  Component Value Date   HGBA1C 6.1 (A) 03/06/2024   HGBA1C 10.5 (H) 12/08/2023   HGBA1C 9.1 (H) 09/27/2023    Latest Reference Range & Units 03/04/24 14:11  Sodium 134 - 144 mmol/L 141  Potassium 3.5 - 5.2 mmol/L 4.8  Chloride 96 - 106 mmol/L 105  CO2 20 - 29 mmol/L 20  Glucose 70 - 99 mg/dL 863 (H)  BUN 8 - 27 mg/dL 13  Creatinine 9.23 - 8.72 mg/dL 7.98 (H)  Calcium  8.6 - 10.2 mg/dL 9.2  Anion gap 89.9 - 81.9 mmol/L 16.0  BUN/Creatinine Ratio 10 - 24  6 (L)  eGFR >59 mL/min/1.73 33 (L)   Alkaline Phosphatase 47 - 123 IU/L 80  Albumin  3.8 - 4.8 g/dL 4.1  AST 0 - 40 IU/L 16  ALT 0 - 44 IU/L 12  Total Protein 6.0 - 8.5 g/dL 6.2  Total Bilirubin 0.0 - 1.2 mg/dL 0.3  (H): Data is abnormally high (L): Data is abnormally low  ASSESSMENT / PLAN / RECOMMENDATIONS:   1) Type 2 Diabetes Mellitus, optimally controlled, With CKD III and macrovascular complications - Most recent A1c of 6.1 %. Goal A1c < 7.0 %.    -His A1c is skewed due to recent blood and iron  transfusions.  His GMI is 7.2% which is much improved from an A1c of 10.5% - During hospitalization, Jardiance  was put on hold due to AKI, this will remain on hold until authorized by nephrology - He has not been taking glipizide , as it does not align with his lifestyle, as he does not eat breakfast until an hour after he gets up and he typically eats supper outside so he is unable to take glipizide  15-20 minutes before meal as instructed to prevent hypoglycemia - I had switched him from Trulicity  to Mounjaro , his girlfriend declined an increase to Mounjaro  due to concerns about his appetite - He is on much higher dose of insulin  than previously prescribed, according  to patient/girlfriend this was his discharge instructions, no change at this time - He did switch to 0 sugar Dr Nunzio which I have congratulated him on  MEDICATIONS:  Continue Mounjaro  5 mg weekly Continue Lantus  20 units every morning and 40 units every evening   EDUCATION / INSTRUCTIONS: BG monitoring instructions: Patient is instructed to check his blood sugars 3 times a day, before meals. Call [facility] if: BG persistently < 70  I reviewed the Rule of 15 for the treatment of hypoglycemia in detail with the patient. Literature supplied.   2) Diabetic complications:  Eye: Does not have known diabetic retinopathy.  Neuro/ Feet: Does not have known diabetic peripheral neuropathy. Renal: Patient does  have known baseline CKD. He is not on  an ACEI/ARB at present.   Follow-up in 6 months   In addition to time spent performing glucose monitor, I spent 25 minutes preparing to see the patient by review of recent labs, imaging and procedures, obtaining and reviewing separately obtained history, communicating with the patient/family or caregiver, ordering medications, tests or procedures, and documenting clinical information in the EHR including the differential Dx, treatment, and any further evaluation and other management   Signed electronically by: Stefano Redgie Butts, MD  [facility]  [facility] [address] [place] [place], [place] [address] Phone: [phone] FAX: [phone]   CC: Fleeta Valeria Mayo, MD [address] [place] [address] Phone: [phone]  Fax: [phone]    Return to Endocrinology clinic as below: Future Appointments  Date Time Provider Department Center  03/11/2024  9:00 AM RPC [facility] - NURSE RPC-RAN None  03/18/2024  9:30 AM RPC [facility] - NURSE RPC-[facility]AN None  03/22/2024 10:00 AM Luiz Channel, MD [facility]  03/25/2024  8:30 AM Fleeta Valeria Mayo, MD RPC-RAN None  04/12/2024  2:00 PM Bernie Lamar PARAS, MD CVD-ASHE None  06/12/2024 10:00 AM H[facility] 10 HVC-ULTRA H&V  06/12/2024 11:00 AM H[facility] 10 H[facility]-UL[facility] H&V  06/12/2024 11:15 AM [facility] PA-2 V[facility]-HV[facility]S H&V

## 2024-03-07 ENCOUNTER — Encounter: Attending: Family Medicine | Admitting: *Deleted

## 2024-03-07 VITALS — BP 129/68 | HR 83 | Temp 97.8°F | Resp 20 | Ht 67.0 in | Wt 178.2 lb

## 2024-03-07 DIAGNOSIS — D509 Iron deficiency anemia, unspecified: Secondary | ICD-10-CM | POA: Diagnosis not present

## 2024-03-07 DIAGNOSIS — D649 Anemia, unspecified: Secondary | ICD-10-CM

## 2024-03-07 MED ORDER — SODIUM CHLORIDE 0.9 % IV SOLN
510.0000 mg | Freq: Once | INTRAVENOUS | Status: AC
  Administered 2024-03-07: 510 mg via INTRAVENOUS
  Filled 2024-03-07: qty 17

## 2024-03-07 NOTE — Progress Notes (Signed)
 Diagnosis: Iron Deficiency Anemia  Provider:  Dr. Abigail Free  Procedure: IV Infusion  IV Type: Peripheral, IV Location: R Forearm  Feraheme (Ferumoxytol), Dose: 510 mg  Infusion Start Time: 0949  Infusion Stop Time: 1005  Post Infusion IV Care: Observation period completed and Peripheral IV Discontinued  Discharge: Condition: Good, Destination: Home . AVS Provided  Performed by:  Rajinder Mesick E, RN

## 2024-03-11 ENCOUNTER — Ambulatory Visit

## 2024-03-12 ENCOUNTER — Ambulatory Visit: Admitting: Internal Medicine

## 2024-03-12 DIAGNOSIS — D500 Iron deficiency anemia secondary to blood loss (chronic): Secondary | ICD-10-CM | POA: Diagnosis not present

## 2024-03-12 NOTE — Progress Notes (Signed)
 Nurse Visit Blood Draw CBC, Iron , Ferritin

## 2024-03-13 LAB — CBC WITH DIFFERENTIAL/PLATELET
Basophils Absolute: 0 x10E3/uL (ref 0.0–0.2)
Basos: 1 %
EOS (ABSOLUTE): 0.2 x10E3/uL (ref 0.0–0.4)
Eos: 2 %
Hematocrit: 34 % — ABNORMAL LOW (ref 37.5–51.0)
Hemoglobin: 10.4 g/dL — ABNORMAL LOW (ref 13.0–17.7)
Immature Grans (Abs): 0 x10E3/uL (ref 0.0–0.1)
Immature Granulocytes: 0 %
Lymphocytes Absolute: 1.1 x10E3/uL (ref 0.7–3.1)
Lymphs: 16 %
MCH: 25.9 pg — ABNORMAL LOW (ref 26.6–33.0)
MCHC: 30.6 g/dL — ABNORMAL LOW (ref 31.5–35.7)
MCV: 85 fL (ref 79–97)
Monocytes Absolute: 0.5 x10E3/uL (ref 0.1–0.9)
Monocytes: 8 %
Neutrophils Absolute: 5.2 x10E3/uL (ref 1.4–7.0)
Neutrophils: 73 %
Platelets: 276 x10E3/uL (ref 150–450)
RBC: 4.02 x10E6/uL — ABNORMAL LOW (ref 4.14–5.80)
RDW: 18.3 % — ABNORMAL HIGH (ref 11.6–15.4)
WBC: 7 x10E3/uL (ref 3.4–10.8)

## 2024-03-13 LAB — FERRITIN: Ferritin: 358 ng/mL (ref 30–400)

## 2024-03-13 LAB — IRON: Iron: 52 ug/dL (ref 38–169)

## 2024-03-18 ENCOUNTER — Other Ambulatory Visit: Payer: Self-pay

## 2024-03-18 ENCOUNTER — Encounter: Payer: Self-pay | Admitting: Family Medicine

## 2024-03-18 ENCOUNTER — Ambulatory Visit: Admitting: Internal Medicine

## 2024-03-18 ENCOUNTER — Encounter: Admitting: *Deleted

## 2024-03-18 VITALS — BP 155/95 | HR 78 | Temp 97.8°F | Resp 20

## 2024-03-18 DIAGNOSIS — D509 Iron deficiency anemia, unspecified: Secondary | ICD-10-CM | POA: Insufficient documentation

## 2024-03-18 DIAGNOSIS — D500 Iron deficiency anemia secondary to blood loss (chronic): Secondary | ICD-10-CM

## 2024-03-18 DIAGNOSIS — D649 Anemia, unspecified: Secondary | ICD-10-CM | POA: Insufficient documentation

## 2024-03-18 MED ORDER — APIXABAN 2.5 MG PO TABS: 2.5000 mg | ORAL_TABLET | Freq: Two times a day (BID) | ORAL | 0 refills | Status: DC

## 2024-03-18 MED ORDER — SODIUM CHLORIDE 0.9 % IV SOLN
510.0000 mg | Freq: Once | INTRAVENOUS | Status: AC
  Administered 2024-03-18: 510 mg via INTRAVENOUS
  Filled 2024-03-18: qty 17

## 2024-03-18 NOTE — Progress Notes (Deleted)
 Diagnosis: Iron  Deficiency Anemia  Provider:  Sherre Clapper MD  Procedure: IV Infusion  IV Type: Peripheral, IV Location: R Forearm  ***, Feraheme (Ferumoxytol ), Dose: 510 mg  Infusion Start Time: 1024  {Infusion Stop Time:25400}  Post Infusion IV Care: {CHINF Post Infusion:25398}  Discharge: {Condition:19696:::1}, {Destination:18313::Home:1} . AVS Declined  Performed by:  Freddie Dymek E, RN

## 2024-03-18 NOTE — Progress Notes (Signed)
 Refilled Eliquis  to [facility] in [place]

## 2024-03-18 NOTE — Progress Notes (Signed)
 Nurse Visit Lab Draw CBC

## 2024-03-18 NOTE — Progress Notes (Signed)
 Diagnosis: Iron  Deficiency Anemia  Provider:  Sherre Clapper MD  Procedure: IV Infusion  IV Type: Peripheral, IV Location: R Forearm  Feraheme (Ferumoxytol ), Dose: 510 mg  Infusion Start Time: 1024  Infusion Stop Time: 1043  Post Infusion IV Care: Observation period completed and Peripheral IV Discontinued  Discharge: Condition: Good, Destination: Home . AVS Declined  Performed by:  Tacari Repass E, RN

## 2024-03-19 LAB — CBC WITH DIFFERENTIAL/PLATELET
Basophils Absolute: 0.1 x10E3/uL (ref 0.0–0.2)
Basos: 1 %
EOS (ABSOLUTE): 0.1 x10E3/uL (ref 0.0–0.4)
Eos: 2 %
Hematocrit: 37.7 % (ref 37.5–51.0)
Hemoglobin: 11.5 g/dL — ABNORMAL LOW (ref 13.0–17.7)
Immature Grans (Abs): 0 x10E3/uL (ref 0.0–0.1)
Immature Granulocytes: 0 %
Lymphocytes Absolute: 0.9 x10E3/uL (ref 0.7–3.1)
Lymphs: 13 %
MCH: 26.6 pg (ref 26.6–33.0)
MCHC: 30.5 g/dL — ABNORMAL LOW (ref 31.5–35.7)
MCV: 87 fL (ref 79–97)
Monocytes Absolute: 0.5 x10E3/uL (ref 0.1–0.9)
Monocytes: 7 %
Neutrophils Absolute: 5.4 x10E3/uL (ref 1.4–7.0)
Neutrophils: 77 %
Platelets: 240 x10E3/uL (ref 150–450)
RBC: 4.33 x10E6/uL (ref 4.14–5.80)
RDW: 17.2 % — ABNORMAL HIGH (ref 11.6–15.4)
WBC: 7 x10E3/uL (ref 3.4–10.8)

## 2024-03-22 ENCOUNTER — Telehealth: Admitting: Internal Medicine

## 2024-03-25 ENCOUNTER — Telehealth: Payer: Self-pay

## 2024-03-25 ENCOUNTER — Ambulatory Visit: Admitting: Internal Medicine

## 2024-03-25 ENCOUNTER — Encounter: Payer: Self-pay | Admitting: Internal Medicine

## 2024-03-25 VITALS — BP 124/68 | HR 82 | Temp 98.4°F | Resp 16 | Ht 67.0 in | Wt 178.0 lb

## 2024-03-25 DIAGNOSIS — E1159 Type 2 diabetes mellitus with other circulatory complications: Secondary | ICD-10-CM | POA: Insufficient documentation

## 2024-03-25 NOTE — Telephone Encounter (Signed)
 Left message for patient to return my call to schedule video capsule endoscopy.

## 2024-03-25 NOTE — Assessment & Plan Note (Signed)
 His A1c was controlled on checking it in 02/2024.  They changed a number of his meds.  I told him not to split his lantus  doses and he can take it as a single dose.

## 2024-03-25 NOTE — Progress Notes (Signed)
 Office Visit  Subjective   Patient ID: [name]   DOB: 17-Aug-1945   Age: 78 y.o.   MRN: [id number]   Chief Complaint Chief Complaint  Patient presents with   Follow-up    3 month follow up      History of Present Illness The patient is a 78 year old Caucasian/White male who returns for a follow-up visit for his T2 diabetes.  He did have his HgBA1c repeated by cardiology on 12/08/2023 and this was 10.5%.  I saw him 3 months ago and his A1c was 9.1% at that time.  I asked him to increase his lantus .  He was on ozempic  but this was too expensive and we went to lantus  injection.    We instead started him on lantus  instead of ozempic .  Again, he was diagnosed with T2 diabetes in the 1990's.  He was in the hospital and he tells me they stopped his glipizide  and his jardiance .  He tells me they asked him to split his lantus  injection to twice a day.  We also had started him on mounjaro .  He remains on Lantus  15 Units in AM and 30 Units at night and mounjaro  5mg  weekly.  He eats 2 meals with breakfast and supper.  He is not walking as much as they would like. He specifically denies unexplained fatigue, palpitations, unexplained abdominal pain, nausea or vomiting or hypoglycemia. He does check blood sugars daily where they range from 120-200.  He came in fasting today in anticipation of lab work. He stopped metformin in the past due to diarrhea.  His last HgBa1c was done 03/06/2024 and was 6.1%.  He has no complications of diabetic retinopathy, neuropathy, or nephropathy.  He does have PVD and CAD associated with his diabetes.   His last dilated diabetic screening eye exam was done by  [facility] on 10/10/2023 and this showed no evidence of diabetic retinopathy.     Past Medical History Past Medical History:  Diagnosis Date   Acute low back pain 01/12/2015   Acute lower limb ischemia 07/11/2023   Acute pain of both shoulders 07/22/2022   AKI (acute kidney injury) 05/24/2022   Arthritis     Atherosclerosis of native arteries of the extremities with intermittent claudication 08/13/2013   Atherosclerosis of native artery of extremity with intermittent claudication 08/13/2013   IMO SNOMED Dx Update Oct 2024     Backache 02/08/2013   Benign prostatic hyperplasia with urinary frequency 02/23/2023   Bladder cancer (HCC)    resection x3   BMI 27.0-27.9,adult 02/23/2023   Cellulitis 07/17/2023   Chronic combined systolic (congestive) and diastolic (congestive) heart failure (HCC) 07/22/2022   Chronic left shoulder pain 05/24/2023   Chronic right shoulder pain 09/02/2022   Chronic systolic CHF (congestive heart failure) (HCC)    CKD stage 3b, GFR 30-44 ml/min (HCC) 07/03/2023   Coronary artery disease    status post DMI RX Taxus stent RCA 2006 with susequent Stent LAD and subsequent  stent thrombosis RCA unable to be opened 2006 -neg mv 10/2008, 10/16/17 ISR to pLAD with PTCA/DES, CTO of RCA with collaterals, EF 25%   Coronary artery disease involving native coronary artery of native heart with unstable angina pectoris (HCC)    Current moderate episode of major depressive disorder without prior episode (HCC) 03/28/2022   Diverticulitis 06/10/2022   Diverticulitis large intestine 05/11/2021   Diverticulitis of large intestine with abscess 05/24/2022   Diverticulitis of sigmoid colon 05/11/2021  DM2 (diabetes mellitus, type 2) (HCC) 02/09/2022   status post bilateral aortobifemoral bypass [id number] with recent fem to fembypass April  2011 per DR. Early     Dyssynergic defecation 03/21/2023   Essential hypertension 07/22/2022   Fatigue 03/13/2023   History of bladder cancer 02/23/2023   History of colonic diverticulitis 03/21/2023   Hypercholesterolemia 10/28/2022   HYPERLIPIDEMIA-MIXED 09/22/2008   Qualifier: Diagnosis of  By: Justina Kos     Hypothyroidism 05/24/2022   Ischemic cardiomyopathy    ejection fraction of 40-45%   Lethargy 03/28/2022   Lumbar spinal stenosis     Myocardial infarction (HCC)    I've had 4 (10/16/2017)   Need for prophylactic vaccination and inoculation against influenza 03/28/2022   NSVT (nonsustained ventricular tachycardia) (HCC)    PAD (peripheral artery disease) 07/17/2023   PAF (paroxysmal atrial fibrillation) (HCC) 02/09/2022   Paroxysmal atrial fibrillation (HCC)    Peripheral arterial disease    Peripheral artery disease 07/09/2023   Poorly controlled T2 diabetes mellitus (HCC) 09/27/2022   Protein-calorie malnutrition, severe 05/29/2022   PVC's (premature ventricular contractions)    PVD 09/22/2008   Qualifier: Diagnosis of  By: Justina Kos     PVD (peripheral vascular disease) 02/23/2023   Rotator cuff arthropathy of right shoulder 09/27/2022   Sigmoid diverticulitis 06/30/2023   Status post coronary artery stent placement    Status post Hartmann's procedure (HCC) 03/21/2023   TOBACCO ABUSE 05/29/2009   Qualifier: Diagnosis of  By: Velinda, RN, BSN, Avelina CROME    Trigger ring finger of right hand 03/29/2023   Type 2 diabetes mellitus with other circulatory complications (HCC) 07/22/2022   Type II diabetes mellitus (HCC)    Unstable angina (HCC) 10/16/2017     Allergies Allergies  Allergen Reactions   Zestril  [Lisinopril ] Swelling and Rash    Facial and tongue swelling   Levaquin [Levofloxacin] Other (See Comments)    Unknown reaction     Medications  Current Outpatient Medications:    acetaminophen  (TYLENOL ) 500 MG tablet, Take 1,000 mg by mouth every 6 (six) hours as needed for headache or fever (pain)., Disp: , Rfl:    amLODipine  (NORVASC ) 5 MG tablet, TAKE 1 TABLET(5 MG) BY MOUTH DAILY, Disp: 90 tablet, Rfl: 2   apixaban  (ELIQUIS ) 2.5 MG TABS tablet, Take 1 tablet (2.5 mg total) by mouth 2 (two) times daily., Disp: 60 tablet, Rfl: 0   aspirin  EC 81 MG tablet, Take 1 tablet (81 mg total) by mouth daily. Swallow whole., Disp: 90 tablet, Rfl: 3   carvedilol  (COREG ) 12.5 MG tablet, Take 1 tablet (12.5 mg  total) by mouth 2 (two) times daily with a meal., Disp: 180 tablet, Rfl: 3   cefadroxil  (DURICEF) 500 MG capsule, Take 1 capsule (500 mg total) by mouth 2 (two) times daily., Disp: 60 capsule, Rfl: 5   [Paused] empagliflozin  (JARDIANCE ) 10 MG TABS tablet, Take 1 tablet (10 mg total) by mouth daily. (Patient not taking: Reported on 03/06/2024), Disp: 90 tablet, Rfl: 3   ezetimibe  (ZETIA ) 10 MG tablet, Take 1 tablet (10 mg total) by mouth daily., Disp: 90 tablet, Rfl: 3   Ferric Maltol  (ACCRUFER ) 30 MG CAPS, Take 1 capsule (30 mg total) by mouth in the morning and at bedtime., Disp: 60 capsule, Rfl: 2   [Paused] furosemide  (LASIX ) 20 MG tablet, Take 1 tablet (20 mg total) by mouth every morning. (Patient not taking: Reported on 03/06/2024), Disp: 90 tablet, Rfl: 1   insulin  glargine (LANTUS ) 100 unit/mL SOPN, Inject  60 Units into the skin daily., Disp: 60 mL, Rfl: 3   Insulin  Pen Needle 32G X 4 MM MISC, 1 Device by Does not apply route daily in the afternoon., Disp: 100 each, Rfl: 3   [Paused] isosorbide  dinitrate (ISORDIL ) 10 MG tablet, Take 1 tablet (10 mg total) by mouth 2 (two) times daily. (Patient not taking: Reported on 03/06/2024), Disp: 180 tablet, Rfl: 3   nitroGLYCERIN  (NITROSTAT ) 0.4 MG SL tablet, Place 0.4 mg under the tongue every 5 (five) minutes x 3 doses as needed for chest pain., Disp: , Rfl:    pantoprazole  (PROTONIX ) 40 MG tablet, Take 40 mg by mouth 2 (two) times daily., Disp: , Rfl:    rosuvastatin  (CRESTOR ) 40 MG tablet, TAKE 1 TABLET(40 MG) BY MOUTH DAILY, Disp: 90 tablet, Rfl: 1   tirzepatide  (MOUNJARO ) 5 MG/0.5ML Pen, Inject 5 mg into the skin once a week., Disp: 6 mL, Rfl: 2   Review of Systems Review of Systems  Constitutional:  Negative for chills and fever.  Eyes:  Negative for blurred vision and double vision.  Respiratory:  Negative for shortness of breath.   Cardiovascular:  Negative for chest pain and palpitations.  Gastrointestinal:  Negative for abdominal pain,  constipation, diarrhea, nausea and vomiting.  Genitourinary:  Negative for frequency.  Skin:  Negative for rash.  Neurological:  Negative for dizziness, weakness and headaches.  Endo/Heme/Allergies:  Negative for polydipsia.       Objective:    Vitals BP 124/68   Pulse 82   Temp 98.4 F (36.9 C) (Temporal)   Resp 16   Ht 5' 7 (1.702 m)   Wt 178 lb (80.7 kg)   SpO2 96%   BMI 27.88 kg/m    Physical Examination Physical Exam Constitutional:      Appearance: Normal appearance. He is not ill-appearing.  Cardiovascular:     Rate and Rhythm: Normal rate and regular rhythm.     Pulses: Normal pulses.     Heart sounds: No murmur heard.    No friction rub. No gallop.  Pulmonary:     Effort: Pulmonary effort is normal. No respiratory distress.     Breath sounds: No wheezing, rhonchi or rales.  Abdominal:     General: Abdomen is flat. Bowel sounds are normal. There is no distension.     Palpations: Abdomen is soft.     Tenderness: There is no abdominal tenderness.  Musculoskeletal:     Right lower leg: No edema.     Left lower leg: No edema.  Skin:    General: Skin is warm and dry.     Findings: No rash.  Neurological:     General: No focal deficit present.     Mental Status: He is alert and oriented to person, place, and time.  Psychiatric:        Mood and Affect: Mood normal.        Behavior: Behavior normal.        Assessment & Plan:   Type 2 diabetes mellitus with other circulatory complications (HCC) His A1c was controlled on checking it in 02/2024.  They changed a number of his meds.  I told him not to split his lantus  doses and he can take it as a single dose.      Return in about 3 months (around 06/23/2024).   Selinda Fleeta Finger, MD

## 2024-03-26 ENCOUNTER — Ambulatory Visit: Payer: Self-pay

## 2024-03-26 NOTE — Telephone Encounter (Signed)
 Our video capsule computer system is currently down. We cannot schedule the patient at this time. See in care everywhere that the [facility] referral is still pending review. Alecia Barbee-Jackson, RN from [facility] has stated she was going to request video capsule results.  Spoke with Crystal at [facility] (Dr. Gioia) office and explained that we cannot perform a video capsule at this time due to the equipment being down but I did not want this to delay the referral. Crystal states she will really this information to Dr. Gioia nurse.

## 2024-03-26 NOTE — Progress Notes (Signed)
 Patient called.  Patient aware.  Per Dr. Fleeta Finger His CKD is stable.  His anemia is stable- it has not worsened or improved. SABRA

## 2024-03-26 NOTE — Telephone Encounter (Signed)
 Patient's video capsule report was not easily visually accessible. Patient did have the test in-patient after his EGD/Colonoscopy. Called [facility] back and informed them I will get the video capsule pictures printed and mailed to them this Thursday.

## 2024-03-28 ENCOUNTER — Encounter: Payer: Self-pay | Admitting: *Deleted

## 2024-03-28 NOTE — Telephone Encounter (Signed)
 Called [facility] and informed Jon, RN that Dr. Donette had requested we send the capsule endoscopy report in color. Informed Jon I have the capsule endoscopy report for this patient in color by email or do they want me to print it and mail it to them. Also, to whose attention. Jon states to email the report to [email or web address]. Emailed capsule endoscopy report and also printed and mailed color copy to [facility].

## 2024-04-01 ENCOUNTER — Ambulatory Visit: Payer: Self-pay

## 2024-04-01 NOTE — Progress Notes (Signed)
 Patient called.  Patient aware.  Per Dr. Fleeta Finger Cancel his iv iron  infusion and ask him to stop the accufer.  His iron  levels look good. SABRA

## 2024-04-04 ENCOUNTER — Encounter: Payer: Self-pay | Admitting: Internal Medicine

## 2024-04-04 ENCOUNTER — Ambulatory Visit: Admitting: Internal Medicine

## 2024-04-04 VITALS — BP 145/83 | HR 90 | Temp 97.9°F | Resp 16 | Wt 175.6 lb

## 2024-04-04 DIAGNOSIS — E1159 Type 2 diabetes mellitus with other circulatory complications: Secondary | ICD-10-CM | POA: Diagnosis not present

## 2024-04-04 DIAGNOSIS — T827XXD Infection and inflammatory reaction due to other cardiac and vascular devices, implants and grafts, subsequent encounter: Secondary | ICD-10-CM | POA: Diagnosis not present

## 2024-04-04 DIAGNOSIS — Z794 Long term (current) use of insulin: Secondary | ICD-10-CM

## 2024-04-04 DIAGNOSIS — E11649 Type 2 diabetes mellitus with hypoglycemia without coma: Secondary | ICD-10-CM | POA: Diagnosis not present

## 2024-04-04 MED ORDER — CEFADROXIL 500 MG PO CAPS: 500.0000 mg | ORAL_CAPSULE | Freq: Two times a day (BID) | ORAL | 0 refills | Status: DC

## 2024-04-04 NOTE — Progress Notes (Signed)
 "     RFV: follow up for  Patient ID: [name], male   DOB: [birth year], 78 y.o.   MRN: [id number]  HPI [name] is a 78yo M with vascular bypass graft, doing well with cefadroxil . He reports having episodes of hypoglycemia 1-2 per week. He was removed from jardiance  while in the hospital.  Has been having hypoglycemia up to BS 50. Dr fleeta eyk left last week.-- only takes lantus  45 units  Outpatient Encounter Medications as of 04/04/2024  Medication Sig   acetaminophen  (TYLENOL ) 500 MG tablet Take 1,000 mg by mouth every 6 (six) hours as needed for headache or fever (pain).   amLODipine  (NORVASC ) 5 MG tablet TAKE 1 TABLET(5 MG) BY MOUTH DAILY   apixaban  (ELIQUIS ) 2.5 MG TABS tablet Take 1 tablet (2.5 mg total) by mouth 2 (two) times daily.   aspirin  EC 81 MG tablet Take 1 tablet (81 mg total) by mouth daily. Swallow whole.   carvedilol  (COREG ) 12.5 MG tablet Take 1 tablet (12.5 mg total) by mouth 2 (two) times daily with a meal.   ezetimibe  (ZETIA ) 10 MG tablet Take 1 tablet (10 mg total) by mouth daily.   Ferric Maltol  (ACCRUFER ) 30 MG CAPS Take 1 capsule (30 mg total) by mouth in the morning and at bedtime.   insulin  glargine (LANTUS ) 100 unit/mL SOPN Inject 60 Units into the skin daily.   Insulin  Pen Needle 32G X 4 MM MISC 1 Device by Does not apply route daily in the afternoon.   nitroGLYCERIN  (NITROSTAT ) 0.4 MG SL tablet Place 0.4 mg under the tongue every 5 (five) minutes x 3 doses as needed for chest pain.   pantoprazole  (PROTONIX ) 40 MG tablet Take 40 mg by mouth 2 (two) times daily.   rosuvastatin  (CRESTOR ) 40 MG tablet TAKE 1 TABLET(40 MG) BY MOUTH DAILY   tirzepatide  (MOUNJARO ) 5 MG/0.5ML Pen Inject 5 mg into the skin once a week.   [DISCONTINUED] cefadroxil  (DURICEF) 500 MG capsule Take 1 capsule (500 mg total) by mouth 2 (two) times daily.   cefadroxil  (DURICEF) 500 MG capsule Take 1 capsule (500 mg total) by mouth 2 (two) times daily.   [Paused] empagliflozin  (JARDIANCE ) 10 MG  TABS tablet Take 1 tablet (10 mg total) by mouth daily. (Patient not taking: Reported on 04/04/2024)   [Paused] furosemide  (LASIX ) 20 MG tablet Take 1 tablet (20 mg total) by mouth every morning. (Patient not taking: Reported on 04/04/2024)   [Paused] isosorbide  dinitrate (ISORDIL ) 10 MG tablet Take 1 tablet (10 mg total) by mouth 2 (two) times daily. (Patient not taking: Reported on 04/04/2024)   No facility-administered encounter medications on file as of 04/04/2024.     Patient Active Problem List   Diagnosis Date Noted   Type 2 diabetes mellitus with other circulatory complications (HCC) 03/25/2024   Iron  deficiency anemia 02/26/2024   Occult GI bleeding 02/23/2024   Angiodysplasia of small intestine, except duodenum with bleeding 02/21/2024   Rectal bleeding 02/20/2024   Benign neoplasm of ascending colon 02/20/2024   Benign neoplasm of transverse colon 02/20/2024   Benign neoplasm of sigmoid colon 02/20/2024   Gastric and duodenal angiodysplasia 02/20/2024   Chronic health problem 02/20/2024   Pressure injury of skin 02/19/2024   GI bleed 02/19/2024   Acute GI bleeding 02/18/2024   Inadequately controlled diabetes mellitus (HCC) 12/27/2023   Anemia due to blood loss 12/27/2023   Chest pain 12/22/2023   Acute anemia 12/22/2023   Poorly controlled diabetes mellitus (HCC) 09/27/2023  MSSA (methicillin susceptible Staphylococcus aureus) infection 08/31/2023   GERD (gastroesophageal reflux disease) 08/28/2023   BPH (benign prostatic hyperplasia) 08/28/2023   Infection of vascular bypass graft 08/25/2023   Arthritis    PAD (peripheral artery disease) 07/17/2023   Cellulitis 07/17/2023   Acute lower limb ischemia 07/11/2023   Peripheral artery disease 07/09/2023   CKD stage 3b, GFR 30-44 ml/min (HCC) 07/03/2023   Sigmoid diverticulitis 06/30/2023   Chronic left shoulder pain 05/24/2023   Trigger ring finger of right hand 03/29/2023   Dyssynergic defecation 03/21/2023    History of colonic diverticulitis 03/21/2023   Status post Hartmann's procedure (HCC) 03/21/2023   Fatigue 03/13/2023   Benign prostatic hyperplasia with urinary frequency 02/23/2023   PVD (peripheral vascular disease) 02/23/2023   History of bladder cancer 02/23/2023   BMI 27.0-27.9,adult 02/23/2023   Hypercholesterolemia 10/28/2022   Rotator cuff arthropathy of right shoulder 09/27/2022   Chronic right shoulder pain 09/02/2022   Type 2 diabetes mellitus treated with insulin  (HCC) 07/22/2022   Paroxysmal atrial fibrillation (HCC) 07/22/2022   HTN (hypertension) 07/22/2022   Acute pain of both shoulders 07/22/2022   Chronic HFrEF (heart failure with reduced ejection fraction) (HCC) 07/22/2022   Chronic combined systolic (congestive) and diastolic (congestive) heart failure (HCC) 07/22/2022   Hypothyroidism 05/24/2022   Need for prophylactic vaccination and inoculation against influenza 03/28/2022   Lethargy 03/28/2022   Bladder cancer (HCC) 02/09/2022   Coronary artery disease 02/09/2022   Ischemic cardiomyopathy 02/09/2022   Lumbar spinal stenosis 02/09/2022   Myocardial infarction (HCC) 02/09/2022   NSVT (nonsustained ventricular tachycardia) (HCC) 02/09/2022   PAF (paroxysmal atrial fibrillation) (HCC) 02/09/2022   PVC's (premature ventricular contractions) 02/09/2022   DM2 (diabetes mellitus, type 2) (HCC) 02/09/2022   Diverticulitis large intestine s/p LAR/colostomy takedown 06/30/2023 05/11/2021   Diverticulitis of sigmoid colon 05/11/2021   Status post coronary artery stent placement    Acute low back pain 01/12/2015   Atherosclerosis of native artery of extremity with intermittent claudication 08/13/2013   Backache 02/08/2013   TOBACCO ABUSE 05/29/2009   Hyperlipidemia 09/22/2008   PVD 09/22/2008     Health Maintenance Due  Topic Date Due   COVID-19 Vaccine (1) Never done   Hepatitis C Screening  Never done   DTaP/Tdap/Td (1 - Tdap) Never done   Zoster Vaccines-  Shingrix (1 of 2) Never done   Pneumococcal Vaccine: 50+ Years (2 of 2 - PCV) 04/13/2022   Lung Cancer Screening  05/26/2023   Diabetic kidney evaluation - Urine ACR  02/22/2024   Medicare Annual Wellness (AWV)  02/22/2024     Review of Systems  Physical Exam   BP (!) 145/83   Pulse 90   Temp 97.9 F (36.6 C) (Oral)   Resp 16   Wt 175 lb 9.6 oz (79.7 kg)   SpO2 96%   BMI 27.50 kg/m    No results found for: CD4TCELL No results found for: CD4TABS No results found for: HIV1RNAQUANT No results found for: HEPBSAB No results found for: RPR, LABRPR  CBC Lab Results  Component Value Date   WBC 7.0 03/18/2024   RBC 4.33 03/18/2024   HGB 11.5 (L) 03/18/2024   HCT 37.7 03/18/2024   PLT 240 03/18/2024   MCV 87 03/18/2024   MCH 26.6 03/18/2024   MCHC 30.5 (L) 03/18/2024   RDW 17.2 (H) 03/18/2024   LYMPHSABS 0.9 03/18/2024   MONOABS 1.1 (H) 08/21/2023   EOSABS 0.1 03/18/2024    BMET Lab Results  Component Value  Date   NA 141 03/04/2024   K 4.8 03/04/2024   CL 105 03/04/2024   CO2 20 03/04/2024   GLUCOSE 136 (H) 03/04/2024   BUN 13 03/04/2024   CREATININE 2.01 (H) 03/04/2024   CALCIUM  9.2 03/04/2024   GFRNONAA 35 (L) 02/25/2024   GFRAA 82 02/09/2018      Assessment and Plan  Hypoglycemia, related to insulin  and T2DM= recommend to decrease to lantus  40-42 units  from 45 u QHS-to minimize hypoglycemia  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

## 2024-04-05 ENCOUNTER — Telehealth: Payer: Self-pay | Admitting: Cardiology

## 2024-04-05 DIAGNOSIS — I25110 Atherosclerotic heart disease of native coronary artery with unstable angina pectoris: Secondary | ICD-10-CM

## 2024-04-05 LAB — BASIC METABOLIC PANEL WITH GFR
BUN/Creatinine Ratio: 13 (calc) (ref 6–22)
BUN: 19 mg/dL (ref 7–25)
CO2: 29 mmol/L (ref 20–32)
Calcium: 9.5 mg/dL (ref 8.6–10.3)
Chloride: 102 mmol/L (ref 98–110)
Creat: 1.43 mg/dL — ABNORMAL HIGH (ref 0.70–1.28)
Glucose, Bld: 180 mg/dL — ABNORMAL HIGH (ref 65–99)
Potassium: 4.3 mmol/L (ref 3.5–5.3)
Sodium: 140 mmol/L (ref 135–146)
eGFR: 50 mL/min/1.73m2 — ABNORMAL LOW

## 2024-04-05 LAB — C-REACTIVE PROTEIN: CRP: 3.1 mg/L

## 2024-04-05 LAB — SEDIMENTATION RATE: Sed Rate: 6 mm/h (ref 0–20)

## 2024-04-05 MED ORDER — EZETIMIBE 10 MG PO TABS: 10.0000 mg | ORAL_TABLET | Freq: Every day | ORAL | 0 refills | Status: AC

## 2024-04-05 MED ORDER — CARVEDILOL 12.5 MG PO TABS: 12.5000 mg | ORAL_TABLET | Freq: Two times a day (BID) | ORAL | 0 refills | Status: DC

## 2024-04-05 NOTE — Telephone Encounter (Signed)
 Caller (Reba) stated patient only has 2 days left of this medication.

## 2024-04-05 NOTE — Telephone Encounter (Signed)
 Requested Prescriptions   Signed Prescriptions Disp Refills   carvedilol  (COREG ) 12.5 MG tablet 180 tablet 0    Sig: Take 1 tablet (12.5 mg total) by mouth 2 (two) times daily with a meal.    Authorizing Provider: BERNIE LAMAR PARAS    Ordering User: Abbiegail Landgren  C   ezetimibe  (ZETIA ) 10 MG tablet 90 tablet 0    Sig: Take 1 tablet (10 mg total) by mouth daily.    Authorizing Provider: BERNIE LAMAR PARAS    Ordering User: WILFRED, Nabilah Davoli  C

## 2024-04-05 NOTE — Telephone Encounter (Signed)
" °*  STAT* If patient is at the pharmacy, call can be transferred to refill team.   1. Which medications need to be refilled? (please list name of each medication and dose if known)   carvedilol  (COREG ) 12.5 MG tablet  ezetimibe  (ZETIA ) 10 MG tablet    2. Would you like to learn more about the convenience, safety, & potential cost savings by using the [facility]? No    3. Are you open to using the [facility] (Type [facility]. No    4. Which pharmacy/location (including street and city if local pharmacy) is medication to be sent to? [facility] [phone] - [place], [place] - [address]     5. Do they need a 30 day or 90 day supply? 90 day    "

## 2024-04-08 NOTE — Telephone Encounter (Signed)
 Refill was sent 04/05/24.

## 2024-04-08 NOTE — Telephone Encounter (Signed)
" °*  STAT* If patient is at the pharmacy, call can be transferred to refill team.   1. Which medications need to be refilled? (please list name of each medication and dose if known)   carvedilol  (COREG ) 12.5 MG tablet  ezetimibe  (ZETIA ) 10 MG tablet   2. Would you like to learn more about the convenience, safety, & potential cost savings by using the [facility]?   3. Are you open to using the [facility] (Type [facility]. ).  4. Which pharmacy/location (including street and city if local pharmacy) is medication to be sent to?  [facility] [phone] - [place], [place] - [address]   5. Do they need a 30 day or 90 day supply?    Caller (Reba) stated pharmacy told her they had not received the refill request as yet and want the prescriptions re-sent. "

## 2024-04-10 ENCOUNTER — Other Ambulatory Visit: Payer: Self-pay | Admitting: Internal Medicine

## 2024-04-12 ENCOUNTER — Ambulatory Visit: Attending: Cardiology | Admitting: Cardiology

## 2024-04-12 ENCOUNTER — Encounter: Payer: Self-pay | Admitting: Cardiology

## 2024-04-12 VITALS — BP 130/70 | HR 100 | Ht 67.0 in | Wt 180.0 lb

## 2024-04-12 DIAGNOSIS — I25118 Atherosclerotic heart disease of native coronary artery with other forms of angina pectoris: Secondary | ICD-10-CM

## 2024-04-12 DIAGNOSIS — I10 Essential (primary) hypertension: Secondary | ICD-10-CM

## 2024-04-12 DIAGNOSIS — E1159 Type 2 diabetes mellitus with other circulatory complications: Secondary | ICD-10-CM

## 2024-04-12 DIAGNOSIS — E782 Mixed hyperlipidemia: Secondary | ICD-10-CM | POA: Diagnosis not present

## 2024-04-12 DIAGNOSIS — I255 Ischemic cardiomyopathy: Secondary | ICD-10-CM | POA: Diagnosis not present

## 2024-04-12 NOTE — Addendum Note (Signed)
 Addended by: ONEITA BERLINER on: 04/12/2024 02:25 PM   Modules accepted: Orders

## 2024-04-12 NOTE — Patient Instructions (Signed)
 Bring medications to the office next week so that we can verify what you are taking and your correct dosages.  Medication Instructions:  Your physician recommends that you continue on your current medications as directed. Please refer to the Current Medication list given to you today.  *If you need a refill on your cardiac medications before your next appointment, please call your pharmacy*   Lab Work: None ordered If you have labs (blood work) drawn today and your tests are completely normal, you will receive your results only by: MyChart Message (if you have MyChart) OR A paper copy in the mail If you have any lab test that is abnormal or we need to change your treatment, we will call you to review the results.  Testing/Procedures: Your physician has requested that you have an echocardiogram. Echocardiography is a painless test that uses sound waves to create images of your heart. It provides your doctor with information about the size and shape of your heart and how well your hearts chambers and valves are working. This procedure takes approximately one hour. There are no restrictions for this procedure. Please do NOT wear cologne, perfume, aftershave, or lotions (deodorant is allowed). Please arrive 15 minutes prior to your appointment time.  Please note: We ask at that you not bring children with you during ultrasound (echo/ vascular) testing. Due to room size and safety concerns, children are not allowed in the ultrasound rooms during exams. Our front office staff cannot provide observation of children in our lobby area while testing is being conducted. An adult accompanying a patient to their appointment will only be allowed in the ultrasound room at the discretion of the ultrasound technician under special circumstances. We apologize for any inconvenience.  Follow-Up: At [facility], you and your health needs are our priority.  As part of our continuing mission to provide you with  exceptional heart care, we have created designated Provider Care Teams.  These Care Teams include your primary Cardiologist (physician) and Advanced Practice Providers (APPs -  Physician Assistants and Nurse Practitioners) who all work together to provide you with the care you need, when you need it.  We recommend signing up for the patient portal called MyChart.  Sign up information is provided on this After Visit Summary.  MyChart is used to connect with patients for Virtual Visits (Telemedicine).  Patients are able to view lab/test results, encounter notes, upcoming appointments, etc.  Non-urgent messages can be sent to your provider as well.   To learn more about what you can do with MyChart, go to [email or web address].    Your next appointment:   3 month(s)  The format for your next appointment:   In Person  Provider:   Jennifer Crape, MD   Other Instructions Echocardiogram An echocardiogram is a test that uses sound waves (ultrasound) to produce images of the heart. Images from an echocardiogram can provide important information about: Heart size and shape. The size and thickness and movement of your heart's walls. Heart muscle function and strength. Heart valve function or if you have stenosis. Stenosis is when the heart valves are too narrow. If blood is flowing backward through the heart valves (regurgitation). A tumor or infectious growth around the heart valves. Areas of heart muscle that are not working well because of poor blood flow or injury from a heart attack. Aneurysm detection. An aneurysm is a weak or damaged part of an artery wall. The wall bulges out from the normal force of blood pumping  through the body. Tell a health care provider about: Any allergies you have. All medicines you are taking, including vitamins, herbs, eye drops, creams, and over-the-counter medicines. Any blood disorders you have. Any surgeries you have had. Any medical conditions you  have. Whether you are pregnant or may be pregnant. What are the risks? Generally, this is a safe test. However, problems may occur, including an allergic reaction to dye (contrast) that may be used during the test. What happens before the test? No specific preparation is needed. You may eat and drink normally. What happens during the test? You will take off your clothes from the waist up and put on a hospital gown. Electrodes or electrocardiogram (ECG)patches may be placed on your chest. The electrodes or patches are then connected to a device that monitors your heart rate and rhythm. You will lie down on a table for an ultrasound exam. A gel will be applied to your chest to help sound waves pass through your skin. A handheld device, called a transducer, will be pressed against your chest and moved over your heart. The transducer produces sound waves that travel to your heart and bounce back (or echo back) to the transducer. These sound waves will be captured in real-time and changed into images of your heart that can be viewed on a video monitor. The images will be recorded on a computer and reviewed by your health care provider. You may be asked to change positions or hold your breath for a short time. This makes it easier to get different views or better views of your heart. In some cases, you may receive contrast through an IV in one of your veins. This can improve the quality of the pictures from your heart. The procedure may vary among health care providers and hospitals.   What can I expect after the test? You may return to your normal, everyday life, including diet, activities, and medicines, unless your health care provider tells you not to do that. Follow these instructions at home: It is up to you to get the results of your test. Ask your health care provider, or the department that is doing the test, when your results will be ready. Keep all follow-up visits. This is  important. Summary An echocardiogram is a test that uses sound waves (ultrasound) to produce images of the heart. Images from an echocardiogram can provide important information about the size and shape of your heart, heart muscle function, heart valve function, and other possible heart problems. You do not need to do anything to prepare before this test. You may eat and drink normally. After the echocardiogram is completed, you may return to your normal, everyday life, unless your health care provider tells you not to do that. This information is not intended to replace advice given to you by your health care provider. Make sure you discuss any questions you have with your health care provider. Document Revised: 11/26/2019 Document Reviewed: 11/26/2019 Elsevier Patient Education  2021 Elsevier Inc.   Important Information About Sugar

## 2024-04-12 NOTE — Progress Notes (Signed)
 " Cardiology Office Note:    Date:  04/12/2024   ID:  [name], DOB [birth year], MRN [id number]  PCP:  Corlis Bernard, FNP  Cardiologist:  Lamar Fitch, MD    Referring MD: Fleeta Finger, Selinda, MD   Chief Complaint  Patient presents with   Follow-up  Doing fine  History of Present Illness:    [name] is a 78 y.o. male with extremely complex past medical history that include paroxysmal atrial fibrillation, COPD, history of ischemic cardiomyopathy, essential hypertension, hyperlipidemia. He comes today to my office to talk about incoming surgery for his reverse colostomy as well as potential shoulder surgery ischemic history is very complex initial event happened in 1992 when he suffered from inferior wall myocardial infarction, PTCA and stent to RCA has been placed at that time 2000 for anterior wall MI required stent to LAD in 2006 he had another myocardial infarction low inferior wall with Taxus stent placed in the right coronary artery and shortly after that another cardiac catheterization has been performed which showed total occlusion of right coronary artery. Last cardiac catheterization done in July 2019 showing severe stenosis of the proximal LAD treated with drug-eluting stent right coronary artery was chronically occluded echocardiogram from October 2019 showed ejection fraction 25 to 30%.  Also does have history of acute occlusion of the SFA that required surgical intervention after that he end up having infection likely completely recover and doing quite nicely.  Last echocardiogram showed ejection fraction 30 to 35%, comes today to my office for follow-up and he said he is doing great he said he is feels best within last 6 months and ever.  No cardiac complaints.  Past Medical History:  Diagnosis Date   Acute low back pain 01/12/2015   Acute lower limb ischemia 07/11/2023   Acute pain of both shoulders 07/22/2022   AKI (acute kidney injury) 05/24/2022   Arthritis     Atherosclerosis of native arteries of the extremities with intermittent claudication 08/13/2013   Atherosclerosis of native artery of extremity with intermittent claudication 08/13/2013   IMO SNOMED Dx Update Oct 2024     Backache 02/08/2013   Benign prostatic hyperplasia with urinary frequency 02/23/2023   Bladder cancer (HCC)    resection x3   BMI 27.0-27.9,adult 02/23/2023   Cellulitis 07/17/2023   Chronic combined systolic (congestive) and diastolic (congestive) heart failure (HCC) 07/22/2022   Chronic left shoulder pain 05/24/2023   Chronic right shoulder pain 09/02/2022   Chronic systolic CHF (congestive heart failure) (HCC)    CKD stage 3b, GFR 30-44 ml/min (HCC) 07/03/2023   Coronary artery disease    status post DMI RX Taxus stent RCA 2006 with susequent Stent LAD and subsequent  stent thrombosis RCA unable to be opened 2006 -neg mv 10/2008, 10/16/17 ISR to pLAD with PTCA/DES, CTO of RCA with collaterals, EF 25%   Coronary artery disease involving native coronary artery of native heart with unstable angina pectoris (HCC)    Current moderate episode of major depressive disorder without prior episode (HCC) 03/28/2022   Diverticulitis 06/10/2022   Diverticulitis large intestine 05/11/2021   Diverticulitis of large intestine with abscess 05/24/2022   Diverticulitis of sigmoid colon 05/11/2021   DM2 (diabetes mellitus, type 2) (HCC) 02/09/2022   status post bilateral aortobifemoral bypass [id number] with recent fem to fembypass April  2011 per DR. Early     Dyssynergic defecation 03/21/2023   Essential hypertension 07/22/2022   Fatigue 03/13/2023   History of bladder cancer 02/23/2023  History of colonic diverticulitis 03/21/2023   Hypercholesterolemia 10/28/2022   HYPERLIPIDEMIA-MIXED 09/22/2008   Qualifier: Diagnosis of  By: Justina Kos     Hypothyroidism 05/24/2022   Ischemic cardiomyopathy    ejection fraction of 40-45%   Lethargy 03/28/2022   Lumbar spinal stenosis     Myocardial infarction (HCC)    I've had 4 (10/16/2017)   Need for prophylactic vaccination and inoculation against influenza 03/28/2022   NSVT (nonsustained ventricular tachycardia) (HCC)    PAD (peripheral artery disease) 07/17/2023   PAF (paroxysmal atrial fibrillation) (HCC) 02/09/2022   Paroxysmal atrial fibrillation (HCC)    Peripheral arterial disease    Peripheral artery disease 07/09/2023   Poorly controlled T2 diabetes mellitus (HCC) 09/27/2022   Protein-calorie malnutrition, severe 05/29/2022   PVC's (premature ventricular contractions)    PVD 09/22/2008   Qualifier: Diagnosis of  By: Justina Kos     PVD (peripheral vascular disease) 02/23/2023   Rotator cuff arthropathy of right shoulder 09/27/2022   Sigmoid diverticulitis 06/30/2023   Status post coronary artery stent placement    Status post Hartmann's procedure (HCC) 03/21/2023   TOBACCO ABUSE 05/29/2009   Qualifier: Diagnosis of  By: Velinda, RN, BSN, Avelina CROME    Trigger ring finger of right hand 03/29/2023   Type 2 diabetes mellitus with other circulatory complications (HCC) 07/22/2022   Type II diabetes mellitus (HCC)    Unstable angina (HCC) 10/16/2017    Past Surgical History:  Procedure Laterality Date   ABDOMINAL AORTOGRAM W/LOWER EXTREMITY Right 07/10/2023   Procedure: ABDOMINAL AORTOGRAM W/LOWER EXTREMITY;  Surgeon: Pearline Norman RAMAN, MD;  Location: [facility] INVASIVE CV LAB;  Service: Cardiovascular;  Laterality: Right;   AORTA - BILATERAL FEMORAL ARTERY BYPASS GRAFT Bilateral 1992   APPENDECTOMY     APPLICATION OF WOUND VAC Left 08/26/2023   Procedure: APPLICATION, WOUND VAC left groin;  Surgeon: Sheree Penne Bruckner, MD;  Location: [facility] OR;  Service: Vascular;  Laterality: Left;   APPLICATION, SKIN SUBSTITUTE Left 08/29/2023   Procedure: APPLICATION, SKIN SUBSTITUTE;  Surgeon: Sheree Penne Bruckner, MD;  Location: [facility] OR;  Service: Vascular;  Laterality: Left;   BACK SURGERY     COLECTOMY WITH COLOSTOMY  CREATION/HARTMANN PROCEDURE N/A 05/26/2022   Procedure: COLECTOMY WITH COLOSTOMY CREATION/HARTMANN PROCEDURE;  Surgeon: Paola Dreama SAILOR, MD;  Location: [facility] OR;  Service: General;  Laterality: N/A;   COLONOSCOPY N/A 02/20/2024   Procedure: COLONOSCOPY;  Surgeon: Albertus [name] HERO, MD;  Location: [facility] ENDOSCOPY;  Service: Gastroenterology;  Laterality: N/A;   CORONARY ANGIOPLASTY WITH STENT PLACEMENT     taxus stent  placed into his right coronary artery; stent placed to the LAD.     CORONARY STENT INTERVENTION N/A 10/16/2017   Procedure: CORONARY STENT INTERVENTION;  Surgeon: Verlin Bruckner BIRCH, MD;  Location: [facility] INVASIVE CV LAB;  Service: Cardiovascular;  Laterality: N/A;   ESOPHAGOGASTRODUODENOSCOPY N/A 02/20/2024   Procedure: EGD (ESOPHAGOGASTRODUODENOSCOPY);  Surgeon: Albertus [name] HERO, MD;  Location: [facility] ENDOSCOPY;  Service: Gastroenterology;  Laterality: N/A;   FEMORAL-FEMORAL BYPASS GRAFT  07/2009   GIVENS CAPSULE STUDY N/A 02/20/2024   Procedure: IMAGING PROCEDURE, GI TRACT, INTRALUMINAL, VIA CAPSULE;  Surgeon: Albertus [name] HERO, MD;  Location: [facility] ENDOSCOPY;  Service: Gastroenterology;  Laterality: N/A;   HOT HEMOSTASIS N/A 02/20/2024   Procedure: EGD, WITH ARGON PLASMA COAGULATION;  Surgeon: Albertus [name] HERO, MD;  Location: [facility] ENDOSCOPY;  Service: Gastroenterology;  Laterality: N/A;   INCISION AND DRAINAGE OF WOUND Left 08/26/2023   Procedure: IRRIGATION AND DEBRIDEMENT left groin  WOUND, application of antibiotic beads;  Surgeon: Sheree Penne Bruckner, MD;  Location: [facility] OR;  Service: Vascular;  Laterality: Left;  WASHOUT LEFT GROIN   INCISION AND DRAINAGE OF WOUND Left 08/29/2023   Procedure: IRRIGATION AND DEBRIDEMENT WOUND;  Surgeon: Sheree Penne Bruckner, MD;  Location[facility] OR;  Service: Vascular;  Laterality: Left;  WASHOUT LEFT GROIN REPEAT   LAPAROTOMY N/A 05/26/2022   Procedure: EXPLORATORY LAPAROTOMY;  Surgeon: Paola Dreama SAILOR, MD;  Location: [facility] OR;  Service: General;  Laterality: N/A;   LOWER  EXTREMITY INTERVENTION Right 07/10/2023   Procedure: LOWER EXTREMITY INTERVENTION;  Surgeon: Pearline Norman RAMAN, MD;  Location: [facility] INVASIVE CV LAB;  Service: Cardiovascular;  Laterality: Right;   LUMBAR MICRODISCECTOMY  12/31/08   LYSIS OF ADHESION N/A 06/30/2023   Procedure: LAPAROSCOPIC LYSIS OF ADHESIONS;  Surgeon: Sheldon Standing, MD;  Location: [facility] ORS;  Service: General;  Laterality: N/A;   PATCH ANGIOPLASTY Left 08/26/2023   Procedure: Primary repair of left femoral graftotomy;  Surgeon: Sheree Penne Bruckner, MD;  Location: [facility] OR;  Service: Vascular;  Laterality: Left;   PERIPHERAL VASCULAR THROMBECTOMY Right 07/10/2023   Procedure: PERIPHERAL VASCULAR THROMBECTOMY;  Surgeon: Pearline Norman RAMAN, MD;  Location: [facility] INVASIVE CV LAB;  Service: Cardiovascular;  Laterality: Right;   PROCTOSCOPY N/A 06/30/2023   Procedure: Flexible sigmoidoscopy;  Surgeon: Sheldon Standing, MD;  Location: [facility] ORS;  Service: General;  Laterality: N/A;   RIGHT/LEFT HEART CATH AND CORONARY ANGIOGRAPHY N/A 10/16/2017   Procedure: RIGHT/LEFT HEART CATH AND CORONARY ANGIOGRAPHY;  Surgeon: Verlin Bruckner BIRCH, MD;  Location: [facility] INVASIVE CV LAB;  Service: Cardiovascular;  Laterality: N/A;   SHOULDER OPEN ROTATOR CUFF REPAIR Left    VACUUM ASSISTED CLOSURE CHANGE Left 08/29/2023   Procedure: REPLACEMENT, WOUND VAC DRESSING, GROIN;  Surgeon: Sheree Penne Bruckner, MD;  Location: [facility] OR;  Service: Vascular;  Laterality: Left;   XI ROBOTIC ASSISTED COLOSTOMY TAKEDOWN N/A 06/30/2023   Procedure: XI ROBOTIC ASSISTED OSTOMY TAKEDOWN, PARTIAL RECTOSIGMOID RESECTION, BILATERAL TAP BLOCK;  Surgeon: Sheldon Standing, MD;  Location: [facility] ORS;  Service: General;  Laterality: N/A;    Current Medications: Active Medications[1]   Allergies:   Zestril  [lisinopril ] and Levaquin [levofloxacin]   Social History   Socioeconomic History   Marital status: Divorced    Spouse name: Not on file   Number of children: 1   Years of education: Not on  file   Highest education level: Not on file  Occupational History   Occupation: retired    Associate Professor: RETIRED    Comment: truck driver  Tobacco Use   Smoking status: Former    Current packs/day: 0.00    Average packs/day: 0.5 packs/day for 56.0 years (28.0 ttl pk-yrs)    Types: Cigars, Cigarettes    Start date: 1966    Quit date: 2022    Years since quitting: 3.9   Smokeless tobacco: Former    Types: Associate Professor status: Never Used  Substance and Sexual Activity   Alcohol use: Yes    Comment: Rarely 1 beer   Drug use: Never   Sexual activity: Not Currently  Other Topics Concern   Not on file  Social History Narrative   Lives with girlfriend in [place].     Social Drivers of Health   Tobacco Use: Medium Risk (04/12/2024)   Patient History    Smoking Tobacco Use: Former    Smokeless Tobacco Use: Former    Passive Exposure: Not on Actuary  Strain: Not on file  Food Insecurity: No Food Insecurity (02/18/2024)   Epic    Worried About Programme Researcher, Broadcasting/film/video in the Last Year: Never true    Ran Out of Food in the Last Year: Never true  Transportation Needs: No Transportation Needs (02/18/2024)   Epic    Lack of Transportation (Medical): No    Lack of Transportation (Non-Medical): No  Physical Activity: Not on file  Stress: Not on file  Social Connections: Unknown (02/18/2024)   Social Connection and Isolation Panel    Frequency of Communication with Friends and Family: More than three times a week    Frequency of Social Gatherings with Friends and Family: More than three times a week    Attends Religious Services: More than 4 times per year    Active Member of [employer] or Organizations: Yes    Attends Banker Meetings: 1 to 4 times per year    Marital Status: Patient unable to answer  Depression (PHQ2-9): Low Risk (08/16/2023)   Depression (PHQ2-9)    PHQ-2 Score: 0  Alcohol Screen: Not on file  Housing: Low Risk (02/18/2024)   Epic     Unable to Pay for Housing in the Last Year: No    Number of Times Moved in the Last Year: 0    Homeless in the Last Year: No  Utilities: Not At Risk (02/18/2024)   Epic    Threatened with loss of utilities: No  Health Literacy: Not on file     Family History: The patient's family history includes Cardiomyopathy in his mother; Coronary artery disease in his father and unknown relative; Diabetes in his father, sister, and unknown relative; Heart attack in his father and sister; Heart disease in his father, mother, and sister; Hyperlipidemia in his father, mother, and sister; Stroke in his father. ROS:   Please see the history of present illness.    All 14 point review of systems negative except as described per history of present illness  EKGs/Labs/Other Studies Reviewed:         Recent Labs: 12/22/2023: Pro Brain Natriuretic Peptide 1,531.0 02/19/2024: TSH 3.726 02/25/2024: Magnesium 1.9 03/04/2024: ALT 12 03/18/2024: Hemoglobin 11.5; Platelets 240 04/04/2024: BUN 19; Creat 1.43; Potassium 4.3; Sodium 140  Recent Lipid Panel    Component Value Date/Time   CHOL 85 (L) 12/08/2023 0818   TRIG 98 12/08/2023 0818   HDL 32 (L) 12/08/2023 0818   CHOLHDL 2.7 12/08/2023 0818   CHOLHDL 6.1 CALC 11/07/2006 0958   VLDL 20 11/07/2006 0958   LDLCALC 34 12/08/2023 0818    Physical Exam:    VS:  BP 130/70   Pulse 100   Ht 5' 7 (1.702 m)   Wt 180 lb (81.6 kg)   SpO2 98%   BMI 28.19 kg/m     Wt Readings from Last 3 Encounters:  04/12/24 180 lb (81.6 kg)  04/04/24 175 lb 9.6 oz (79.7 kg)  03/25/24 178 lb (80.7 kg)     GEN:  Well nourished, well developed in no acute distress HEENT: Normal NECK: No JVD; No carotid bruits LYMPHATICS: No lymphadenopathy CARDIAC: RRR, no murmurs, no rubs, no gallops RESPIRATORY:  Clear to auscultation without rales, wheezing or rhonchi  ABDOMEN: Soft, non-tender, non-distended MUSCULOSKELETAL:  No edema; No deformity  SKIN: Warm and dry LOWER  EXTREMITIES: no swelling NEUROLOGIC:  Alert and oriented x 3 PSYCHIATRIC:  Normal affect   ASSESSMENT:    1. Coronary artery disease of native artery of  native heart with stable angina pectoris   2. Ischemic cardiomyopathy   3. Primary hypertension   4. Type 2 diabetes mellitus with other circulatory complications (HCC)   5. Mixed hyperlipidemia    PLAN:    In order of problems listed above:  History of ischemic cardiomyopathy last estimation ejection fraction 30 to 35%, he have no clue what medication he takes.  I am taking we will have we will.  I asked him to come to us  at the beginning of next week and bring all his medications we can verify and check what he is taking.  His ejection fraction last time was 30 to 35%, he needs criteria for ICD, however he does not look so much better he feels much better I will recheck his left ventricular ejection fraction with echocardiogram. Coronary disease denies having symptoms on guideline directed medical therapy but we do not know for sure what he takes 1 more time he will bring it to us  next week and we will verify his medications. Type 2 diabetes, I did review KPN which will be dated from November 19 with A1c of 6.1 good control. Dyslipidemia I did review [facility] from [facility] in August LDL 34 HDL 32 continue present management   Medication Adjustments/Labs and Tests Ordered: Current medicines are reviewed at length with the patient today.  Concerns regarding medicines are outlined above.  No orders of the defined types were placed in this encounter.  Medication changes: No orders of the defined types were placed in this encounter.   Signed, Lamar DOROTHA Fitch, MD, [facility] 04/12/2024 2:18 PM    [facility]    [1]  No outpatient medications have been marked as taking for the 04/12/24 encounter (Office Visit) with Drexler Maland J, MD.   "

## 2024-04-22 ENCOUNTER — Other Ambulatory Visit: Payer: Self-pay | Admitting: Internal Medicine

## 2024-04-22 ENCOUNTER — Other Ambulatory Visit: Payer: Self-pay

## 2024-04-22 ENCOUNTER — Telehealth: Payer: Self-pay | Admitting: Cardiology

## 2024-04-22 ENCOUNTER — Other Ambulatory Visit: Payer: Self-pay | Admitting: Cardiology

## 2024-04-22 MED ORDER — FUROSEMIDE 20 MG PO TABS: 20.0000 mg | ORAL_TABLET | ORAL | Status: AC

## 2024-04-22 MED ORDER — AMLODIPINE BESYLATE 5 MG PO TABS: 5.0000 mg | ORAL_TABLET | Freq: Every day | ORAL | Status: AC

## 2024-04-22 NOTE — Telephone Encounter (Signed)
" °*  STAT* If patient is at the pharmacy, call can be transferred to refill team.   1. Which medications need to be refilled? (please list name of each medication and dose if known) carvedilol  (COREG ) 12.5 MG tablet       4. Which pharmacy/location (including street and city if local pharmacy) is medication to be sent to? [facility] [phone] - [place], [place] - [address] AT [facility] [address] Phone: [phone]  Fax: [phone]       5. Do they need a 30 day or 90 day supply? 902    "

## 2024-04-22 NOTE — Telephone Encounter (Signed)
 Med list brought in -updated list

## 2024-04-24 MED ORDER — CARVEDILOL 12.5 MG PO TABS: 12.5000 mg | ORAL_TABLET | Freq: Two times a day (BID) | ORAL | 3 refills | Status: AC

## 2024-04-24 NOTE — Telephone Encounter (Signed)
 Pt's medication was sent to pt's pharmacy as requested. Confirmation received.

## 2024-05-03 ENCOUNTER — Telehealth: Payer: Self-pay | Admitting: Internal Medicine

## 2024-05-03 NOTE — Telephone Encounter (Signed)
 Spoke with patient and advised they contact insurance and find out what alternatives are.  Patient would like to know other options

## 2024-05-03 NOTE — Telephone Encounter (Signed)
 Patient came in to office today asking to speak with a clinical staff member concerning his Mounjaro  prescription.  Patient states that his out of pocket is $1600+.

## 2024-05-09 ENCOUNTER — Ambulatory Visit: Attending: Cardiology

## 2024-05-09 DIAGNOSIS — I25118 Atherosclerotic heart disease of native coronary artery with other forms of angina pectoris: Secondary | ICD-10-CM

## 2024-05-09 DIAGNOSIS — I255 Ischemic cardiomyopathy: Secondary | ICD-10-CM | POA: Diagnosis not present

## 2024-05-09 LAB — ECHOCARDIOGRAM COMPLETE
Area-P 1/2: 4.58 cm2
S' Lateral: 4.5 cm

## 2024-05-14 ENCOUNTER — Ambulatory Visit: Payer: Self-pay | Admitting: Cardiology

## 2024-05-16 ENCOUNTER — Other Ambulatory Visit: Payer: Self-pay

## 2024-05-16 ENCOUNTER — Encounter: Payer: Self-pay | Admitting: Internal Medicine

## 2024-05-16 ENCOUNTER — Ambulatory Visit: Payer: Self-pay | Admitting: Internal Medicine

## 2024-05-16 VITALS — BP 171/70 | HR 85 | Temp 98.2°F | Wt 178.6 lb

## 2024-05-16 DIAGNOSIS — B9561 Methicillin susceptible Staphylococcus aureus infection as the cause of diseases classified elsewhere: Secondary | ICD-10-CM | POA: Diagnosis not present

## 2024-05-16 DIAGNOSIS — N1832 Chronic kidney disease, stage 3b: Secondary | ICD-10-CM

## 2024-05-16 DIAGNOSIS — R040 Epistaxis: Secondary | ICD-10-CM

## 2024-05-16 DIAGNOSIS — Z79899 Other long term (current) drug therapy: Secondary | ICD-10-CM | POA: Diagnosis not present

## 2024-05-16 DIAGNOSIS — T827XXD Infection and inflammatory reaction due to other cardiac and vascular devices, implants and grafts, subsequent encounter: Secondary | ICD-10-CM

## 2024-05-16 MED ORDER — CEFADROXIL 500 MG PO CAPS: 500.0000 mg | ORAL_CAPSULE | Freq: Two times a day (BID) | ORAL | 0 refills | Status: AC

## 2024-05-16 NOTE — Progress Notes (Signed)
 "      Patient ID: [name], male   DOB: [birth year], 79 y.o.   MRN: [id number]  HPI 79yo M with hx of MSSA Vascular graft infection  in May 2025, treated with IV abtx then continued on cefadroxil , lifelong suppression. He was last seen in mid December and his inflammatory markers were WNL. Feels the best that he has had in the last 2 years.   Had bloody nose after he blew his nose this morning. He does use a humidifier. He hasn't had this happen in a long time per his report.  Lab Results  Component Value Date   ESRSEDRATE 6 04/04/2024   Lab Results  Component Value Date   CRP 3.1 04/04/2024     Outpatient Encounter Medications as of 05/16/2024  Medication Sig   acetaminophen  (TYLENOL ) 500 MG tablet Take 1,000 mg by mouth every 6 (six) hours as needed for headache or fever (pain).   amLODipine  (NORVASC ) 5 MG tablet Take 1 tablet (5 mg total) by mouth daily.   aspirin  EC 81 MG tablet Take 1 tablet (81 mg total) by mouth daily. Swallow whole.   carvedilol  (COREG ) 12.5 MG tablet Take 1 tablet (12.5 mg total) by mouth 2 (two) times daily with a meal.   cefadroxil  (DURICEF) 500 MG capsule Take 1 capsule (500 mg total) by mouth 2 (two) times daily.   ELIQUIS  2.5 MG TABS tablet TAKE 1 TABLET(2.5 MG) BY MOUTH TWICE DAILY   ezetimibe  (ZETIA ) 10 MG tablet Take 1 tablet (10 mg total) by mouth daily.   Ferric Maltol  (ACCRUFER ) 30 MG CAPS Take 1 capsule (30 mg total) by mouth in the morning and at bedtime.   furosemide  (LASIX ) 20 MG tablet Take 1 tablet (20 mg total) by mouth 3 (three) times a week.   insulin  glargine (LANTUS ) 100 unit/mL SOPN Inject 60 Units into the skin daily.   Insulin  Pen Needle 32G X 4 MM MISC 1 Device by Does not apply route daily in the afternoon.   nitroGLYCERIN  (NITROSTAT ) 0.4 MG SL tablet Place 0.4 mg under the tongue every 5 (five) minutes x 3 doses as needed for chest pain.   pantoprazole  (PROTONIX ) 40 MG tablet Take 40 mg by mouth 2 (two) times daily.    rosuvastatin  (CRESTOR ) 40 MG tablet TAKE 1 TABLET(40 MG) BY MOUTH DAILY   tirzepatide  (MOUNJARO ) 5 MG/0.5ML Pen Inject 5 mg into the skin once a week.   No facility-administered encounter medications on file as of 05/16/2024.     Patient Active Problem List   Diagnosis Date Noted   Type 2 diabetes mellitus with other circulatory complications (HCC) 03/25/2024   Iron  deficiency anemia 02/26/2024   Occult GI bleeding 02/23/2024   Angiodysplasia of small intestine, except duodenum with bleeding 02/21/2024   Rectal bleeding 02/20/2024   Benign neoplasm of ascending colon 02/20/2024   Benign neoplasm of transverse colon 02/20/2024   Benign neoplasm of sigmoid colon 02/20/2024   Gastric and duodenal angiodysplasia 02/20/2024   Chronic health problem 02/20/2024   Pressure injury of skin 02/19/2024   GI bleed 02/19/2024   Acute GI bleeding 02/18/2024   Inadequately controlled diabetes mellitus (HCC) 12/27/2023   Anemia due to blood loss 12/27/2023   Chest pain 12/22/2023   Acute anemia 12/22/2023   Poorly controlled diabetes mellitus (HCC) 09/27/2023   MSSA (methicillin susceptible Staphylococcus aureus) infection 08/31/2023   GERD (gastroesophageal reflux disease) 08/28/2023   BPH (benign prostatic hyperplasia) 08/28/2023   Infection of vascular  bypass graft 08/25/2023   Arthritis    PAD (peripheral artery disease) 07/17/2023   Cellulitis 07/17/2023   Acute lower limb ischemia 07/11/2023   Peripheral artery disease 07/09/2023   CKD stage 3b, GFR 30-44 ml/min (HCC) 07/03/2023   Sigmoid diverticulitis 06/30/2023   Chronic left shoulder pain 05/24/2023   Trigger ring finger of right hand 03/29/2023   Dyssynergic defecation 03/21/2023   History of colonic diverticulitis 03/21/2023   Status post Hartmann's procedure (HCC) 03/21/2023   Fatigue 03/13/2023   Benign prostatic hyperplasia with urinary frequency 02/23/2023   PVD (peripheral vascular disease) 02/23/2023   History of bladder  cancer 02/23/2023   BMI 27.0-27.9,adult 02/23/2023   Hypercholesterolemia 10/28/2022   Rotator cuff arthropathy of right shoulder 09/27/2022   Chronic right shoulder pain 09/02/2022   Type 2 diabetes mellitus treated with insulin  (HCC) 07/22/2022   Paroxysmal atrial fibrillation (HCC) 07/22/2022   HTN (hypertension) 07/22/2022   Acute pain of both shoulders 07/22/2022   Chronic HFrEF (heart failure with reduced ejection fraction) (HCC) 07/22/2022   Chronic combined systolic (congestive) and diastolic (congestive) heart failure (HCC) 07/22/2022   Hypothyroidism 05/24/2022   Need for prophylactic vaccination and inoculation against influenza 03/28/2022   Lethargy 03/28/2022   Bladder cancer (HCC) 02/09/2022   Coronary artery disease 02/09/2022   Ischemic cardiomyopathy 02/09/2022   Lumbar spinal stenosis 02/09/2022   Myocardial infarction (HCC) 02/09/2022   NSVT (nonsustained ventricular tachycardia) (HCC) 02/09/2022   PAF (paroxysmal atrial fibrillation) (HCC) 02/09/2022   PVC's (premature ventricular contractions) 02/09/2022   DM2 (diabetes mellitus, type 2) (HCC) 02/09/2022   Diverticulitis large intestine s/p LAR/colostomy takedown 06/30/2023 05/11/2021   Diverticulitis of sigmoid colon 05/11/2021   Status post coronary artery stent placement    Acute low back pain 01/12/2015   Atherosclerosis of native artery of extremity with intermittent claudication 08/13/2013   Backache 02/08/2013   TOBACCO ABUSE 05/29/2009   Hyperlipidemia 09/22/2008   PVD 09/22/2008     Health Maintenance Due  Topic Date Due   COVID-19 Vaccine (1) Never done   Hepatitis C Screening  Never done   DTaP/Tdap/Td (1 - Tdap) Never done   Zoster Vaccines- Shingrix (1 of 2) Never done   Pneumococcal Vaccine: 50+ Years (2 of 2 - PCV) 04/13/2022   Lung Cancer Screening  05/26/2023   Diabetic kidney evaluation - Urine ACR  02/22/2024   Medicare Annual Wellness (AWV)  02/22/2024     Review of Systems 12  point ros is otherwise negative Physical Exam   BP (!) 171/70   Pulse 85   Temp 98.2 F (36.8 C) (Oral)   Wt 178 lb 9.6 oz (81 kg)   SpO2 96%   BMI 27.97 kg/m   Physical Exam  Constitutional: He is oriented to person, place, and time. He appears well-developed and well-nourished. No distress.  HENT:  Mouth/Throat: Oropharynx is clear and moist. No oropharyngeal exudate.  Cardiovascular: Normal rate, regular rhythm and normal heart sounds. Exam reveals no gallop and no friction rub.  No murmur heard.  Pulmonary/Chest: Effort normal and breath sounds normal. No respiratory distress. He has no wheezes.  Lymphadenopathy:  He has no cervical adenopathy.  Neurological: He is alert and oriented to person, place, and time.  Skin: Skin is warm and dry. No rash noted. No erythema.  Psychiatric: He has a normal mood and affect. His behavior is normal.    CBC Lab Results  Component Value Date   WBC 7.0 03/18/2024   RBC 4.33 03/18/2024  HGB 11.5 (L) 03/18/2024   HCT 37.7 03/18/2024   PLT 240 03/18/2024   MCV 87 03/18/2024   MCH 26.6 03/18/2024   MCHC 30.5 (L) 03/18/2024   RDW 17.2 (H) 03/18/2024   LYMPHSABS 0.9 03/18/2024   MONOABS 1.1 (H) 08/21/2023   EOSABS 0.1 03/18/2024    BMET Lab Results  Component Value Date   NA 140 04/04/2024   K 4.3 04/04/2024   CL 102 04/04/2024   CO2 29 04/04/2024   GLUCOSE 180 (H) 04/04/2024   BUN 19 04/04/2024   CREATININE 1.43 (H) 04/04/2024   CALCIUM  9.5 04/04/2024   GFRNONAA 35 (L) 02/25/2024   GFRAA 82 02/09/2018      Assessment and Plan Chronic Vascular graft infection, with MSSA infection= plan to continue with cefadroxil  500mg  po bid for chronic suppression. Will check sed rate, crp and bmp, will give refills  Ckd 3 and long term medication management= will check cr to see that it is still at baseline  Epistaxis = suspect from dry weather, will check cbc "

## 2024-05-17 LAB — BASIC METABOLIC PANEL WITH GFR
BUN/Creatinine Ratio: 9 (calc) (ref 6–22)
BUN: 12 mg/dL (ref 7–25)
CO2: 29 mmol/L (ref 20–32)
Calcium: 8.9 mg/dL (ref 8.6–10.3)
Chloride: 102 mmol/L (ref 98–110)
Creat: 1.39 mg/dL — ABNORMAL HIGH (ref 0.70–1.28)
Glucose, Bld: 221 mg/dL — ABNORMAL HIGH (ref 65–99)
Potassium: 3.9 mmol/L (ref 3.5–5.3)
Sodium: 139 mmol/L (ref 135–146)
eGFR: 52 mL/min/{1.73_m2} — ABNORMAL LOW

## 2024-05-17 LAB — SEDIMENTATION RATE: Sed Rate: 2 mm/h (ref 0–20)

## 2024-05-17 LAB — C-REACTIVE PROTEIN: CRP: <3 mg/L

## 2024-06-12 ENCOUNTER — Encounter (HOSPITAL_COMMUNITY)

## 2024-06-12 ENCOUNTER — Ambulatory Visit

## 2024-06-21 ENCOUNTER — Ambulatory Visit

## 2024-07-11 ENCOUNTER — Ambulatory Visit: Admitting: Cardiology

## 2024-08-08 ENCOUNTER — Ambulatory Visit: Payer: Self-pay | Admitting: Internal Medicine

## 2024-09-04 ENCOUNTER — Ambulatory Visit: Admitting: Internal Medicine
